# Patient Record
Sex: Male | Born: 1965
Health system: Southern US, Community
[De-identification: ages and names within clinical notes are randomized; demographics above are authoritative.]

## PROBLEM LIST (undated history)

## (undated) ENCOUNTER — Emergency Department (HOSPITAL_COMMUNITY): Payer: Medicaid Other

## (undated) DIAGNOSIS — M48061 Spinal stenosis, lumbar region without neurogenic claudication: Secondary | ICD-10-CM

## (undated) DIAGNOSIS — W3400XA Accidental discharge from unspecified firearms or gun, initial encounter: Secondary | ICD-10-CM

## (undated) DIAGNOSIS — Z9289 Personal history of other medical treatment: Secondary | ICD-10-CM

## (undated) DIAGNOSIS — G5630 Lesion of radial nerve, unspecified upper limb: Secondary | ICD-10-CM

## (undated) DIAGNOSIS — G629 Polyneuropathy, unspecified: Secondary | ICD-10-CM

## (undated) DIAGNOSIS — F1010 Alcohol abuse, uncomplicated: Secondary | ICD-10-CM

## (undated) DIAGNOSIS — K859 Acute pancreatitis without necrosis or infection, unspecified: Secondary | ICD-10-CM

## (undated) DIAGNOSIS — K219 Gastro-esophageal reflux disease without esophagitis: Secondary | ICD-10-CM

## (undated) DIAGNOSIS — M1990 Unspecified osteoarthritis, unspecified site: Secondary | ICD-10-CM

## (undated) DIAGNOSIS — K819 Cholecystitis, unspecified: Secondary | ICD-10-CM

## (undated) DIAGNOSIS — B182 Chronic viral hepatitis C: Secondary | ICD-10-CM

## (undated) DIAGNOSIS — C3490 Malignant neoplasm of unspecified part of unspecified bronchus or lung: Secondary | ICD-10-CM

## (undated) HISTORY — DX: Polyneuropathy, unspecified: G62.9

## (undated) HISTORY — DX: Unspecified osteoarthritis, unspecified site: M19.90

## (undated) HISTORY — PX: COLONOSCOPY: SHX174

## (undated) HISTORY — DX: Personal history of other medical treatment: Z92.89

## (undated) HISTORY — PX: OTHER SURGICAL HISTORY: SHX169

## (undated) HISTORY — DX: Spinal stenosis, lumbar region without neurogenic claudication: M48.061

## (undated) HISTORY — DX: Malignant neoplasm of unspecified part of unspecified bronchus or lung: C34.90

## (undated) HISTORY — DX: Accidental discharge from unspecified firearms or gun, initial encounter: W34.00XA

## (undated) HISTORY — DX: Gastro-esophageal reflux disease without esophagitis: K21.9

## (undated) HISTORY — DX: Chronic viral hepatitis C: B18.2

## (undated) HISTORY — PX: UPPER GASTROINTESTINAL ENDOSCOPY: SHX188

---

## 1985-12-01 HISTORY — PX: COLOSTOMY: SHX63

## 1986-12-01 HISTORY — PX: COLOSTOMY TAKEDOWN: SHX5783

## 1996-12-01 HISTORY — PX: COLOSTOMY TAKEDOWN: SHX5783

## 1998-07-03 ENCOUNTER — Encounter: Admission: RE | Admit: 1998-07-03 | Discharge: 1998-10-01 | Payer: Self-pay | Admitting: Anesthesiology

## 1998-07-23 ENCOUNTER — Emergency Department (HOSPITAL_COMMUNITY): Admission: EM | Admit: 1998-07-23 | Discharge: 1998-07-23 | Payer: Self-pay | Admitting: Internal Medicine

## 2002-11-08 ENCOUNTER — Emergency Department (HOSPITAL_COMMUNITY): Admission: EM | Admit: 2002-11-08 | Discharge: 2002-11-08 | Payer: Self-pay | Admitting: Emergency Medicine

## 2002-11-08 ENCOUNTER — Encounter: Payer: Self-pay | Admitting: Emergency Medicine

## 2002-11-17 ENCOUNTER — Encounter: Payer: Self-pay | Admitting: General Surgery

## 2002-11-17 ENCOUNTER — Encounter: Admission: RE | Admit: 2002-11-17 | Discharge: 2002-11-17 | Payer: Self-pay | Admitting: General Surgery

## 2004-05-21 ENCOUNTER — Emergency Department (HOSPITAL_COMMUNITY): Admission: EM | Admit: 2004-05-21 | Discharge: 2004-05-21 | Payer: Self-pay | Admitting: Family Medicine

## 2004-05-23 ENCOUNTER — Emergency Department (HOSPITAL_COMMUNITY): Admission: EM | Admit: 2004-05-23 | Discharge: 2004-05-23 | Payer: Self-pay | Admitting: Family Medicine

## 2004-06-10 ENCOUNTER — Emergency Department (HOSPITAL_COMMUNITY): Admission: EM | Admit: 2004-06-10 | Discharge: 2004-06-10 | Payer: Self-pay | Admitting: Emergency Medicine

## 2004-07-24 ENCOUNTER — Emergency Department (HOSPITAL_COMMUNITY): Admission: EM | Admit: 2004-07-24 | Discharge: 2004-07-24 | Payer: Self-pay | Admitting: Emergency Medicine

## 2004-11-19 ENCOUNTER — Ambulatory Visit: Payer: Self-pay | Admitting: Internal Medicine

## 2004-11-21 ENCOUNTER — Emergency Department (HOSPITAL_COMMUNITY): Admission: EM | Admit: 2004-11-21 | Discharge: 2004-11-21 | Payer: Self-pay | Admitting: Emergency Medicine

## 2004-12-11 ENCOUNTER — Ambulatory Visit: Payer: Self-pay | Admitting: Internal Medicine

## 2004-12-11 ENCOUNTER — Ambulatory Visit (HOSPITAL_COMMUNITY): Admission: RE | Admit: 2004-12-11 | Discharge: 2004-12-11 | Payer: Self-pay | Admitting: Internal Medicine

## 2005-07-12 ENCOUNTER — Emergency Department (HOSPITAL_COMMUNITY): Admission: EM | Admit: 2005-07-12 | Discharge: 2005-07-12 | Payer: Self-pay | Admitting: Emergency Medicine

## 2006-07-27 ENCOUNTER — Emergency Department (HOSPITAL_COMMUNITY): Admission: EM | Admit: 2006-07-27 | Discharge: 2006-07-27 | Payer: Self-pay | Admitting: Emergency Medicine

## 2007-10-21 ENCOUNTER — Emergency Department (HOSPITAL_COMMUNITY): Admission: EM | Admit: 2007-10-21 | Discharge: 2007-10-21 | Payer: Self-pay | Admitting: Emergency Medicine

## 2009-07-18 ENCOUNTER — Emergency Department (HOSPITAL_COMMUNITY): Admission: EM | Admit: 2009-07-18 | Discharge: 2009-07-18 | Payer: Self-pay | Admitting: Emergency Medicine

## 2010-04-22 ENCOUNTER — Emergency Department (HOSPITAL_COMMUNITY): Admission: EM | Admit: 2010-04-22 | Discharge: 2010-04-22 | Payer: Self-pay | Admitting: Emergency Medicine

## 2010-07-23 ENCOUNTER — Inpatient Hospital Stay (HOSPITAL_COMMUNITY): Admission: EM | Admit: 2010-07-23 | Discharge: 2010-07-24 | Payer: Self-pay | Admitting: Emergency Medicine

## 2010-07-23 ENCOUNTER — Ambulatory Visit: Payer: Self-pay | Admitting: Internal Medicine

## 2010-08-07 ENCOUNTER — Ambulatory Visit: Payer: Self-pay | Admitting: Internal Medicine

## 2010-08-07 ENCOUNTER — Encounter: Payer: Self-pay | Admitting: Internal Medicine

## 2010-08-07 DIAGNOSIS — J939 Pneumothorax, unspecified: Secondary | ICD-10-CM | POA: Insufficient documentation

## 2010-08-07 DIAGNOSIS — J439 Emphysema, unspecified: Secondary | ICD-10-CM

## 2010-08-07 DIAGNOSIS — R002 Palpitations: Secondary | ICD-10-CM | POA: Insufficient documentation

## 2010-08-07 DIAGNOSIS — J449 Chronic obstructive pulmonary disease, unspecified: Secondary | ICD-10-CM | POA: Insufficient documentation

## 2010-08-07 DIAGNOSIS — F17200 Nicotine dependence, unspecified, uncomplicated: Secondary | ICD-10-CM | POA: Insufficient documentation

## 2010-08-07 DIAGNOSIS — J930 Spontaneous tension pneumothorax: Secondary | ICD-10-CM

## 2010-08-07 HISTORY — DX: Spontaneous tension pneumothorax: J93.0

## 2010-08-07 HISTORY — DX: Emphysema, unspecified: J43.9

## 2010-11-05 ENCOUNTER — Telehealth (INDEPENDENT_AMBULATORY_CARE_PROVIDER_SITE_OTHER): Payer: Self-pay | Admitting: *Deleted

## 2010-11-13 ENCOUNTER — Encounter: Payer: Self-pay | Admitting: Internal Medicine

## 2011-01-02 NOTE — Letter (Signed)
Summary: [facility]  [address], [place] [address]   Phone: [phone]  Fax: [phone]    11/13/2010  [facility] [address] [place], [place]  [address]  Dear Mr. WOON,   [place] records indicate that you are now due for a followup here with Dr. Christinia Gully.  Please call the office at [phone] to schedule an appointment. Thank You.        Sincerely,   [employer] [facility]

## 2011-01-02 NOTE — Progress Notes (Signed)
Summary: needs f/u with cxr- ATC NA x 2-letter mailed  ---- Converted from flag ---- ---- 08/07/2010 8:42 PM, Tanda Rockers MD wrote: f/u cxr due by now ------------------------------  ATC pt NA and no option to leave a msg, WCB Tilden Dome  November 06, 2010 10:06 AM  ATC pt, still NA and no option to leave a msg. WCB. Tilden Dome  November 07, 2010 9:30 AM  ATC pt still NA and no option to leave msg. will try back later [facility]  November 12, 2010 4:08 PM   Still unable to reach pt so mailed letter Tilden Dome  November 13, 2010 2:18 PM

## 2011-01-02 NOTE — Assessment & Plan Note (Signed)
Summary: Pulmonary/ first outpt eval for bullitis- needs Alpha One AT    Visit Type:  Hospital Follow-up Primary Provider/Referring Provider:  none  CC:  Post hospital followup.  Pt states that since this am has had dull CP in the center of his chest- constant.  He c/o SOB when he wakes in the am and sometimes gets out of breath with continuous exertion.  Marland Kitchen  History of Present Illness: 26 yobm smoker admitted with right apical bulitis presenting with cp but no fever or purulent sputume 07/23/10  August 07, 2010 Post hospital followup.  Pt states that since this am has had dull CP in the center of his chest- constant.  He c/o SOB when he wakes in the am and sometimes gets out of breath with continuous exertion.     4 cups of coffee daily with palpitations. Pt denies any significant sore throat, dysphagia, itching, sneezing,  nasal congestion or excess secretions,  fever, chills, sweats, unintended wt loss, pleuritic or exertional cp, hempoptysis, change in activity tolerance  orthopnea pnd or leg swelling.   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  Oropharynx no thrush or excess pnd or cobblestoning.  No JVD or cervical adenopathy. Mild accessory muscle hypertrophy. Trachea midline, nl thryroid. Chest was hyperinflated by percussion with diminished breath sounds and moderate increased exp time without wheeze. Hoover sign positive at mid inspiration. Regular rate and rhythm without murmur gallop or rub or increase P2 or edema.  Abd: no hsm, nl excursion. Ext warm without cyanosis or clubbing.     CXR  Procedure date:  08/07/2010  Findings:      Emphysematous changes again seen with biapical blebs.  Air fluid level is again seen in a bleb at the medial right lung apex  Impression & Recommendations:  Problem # 1:  COPD (ICD-496)   DDX of  difficult airways managment all start with A and  include Adherence, Ace Inhibitors, Acid Reflux, Active Sinus Disease, Alpha 1 Antitripsin deficiency, Anxiety masquerading as Airways dz,  ABPA,  allergy(esp in young), Aspiration (esp in elderly), Adverse effects of DPI,  Active smokers, plus one B  = Beta blocker use..   Needs alpha 1 AT  screening -  - Alpha One AT not done per e chart review - in tickle file  Active smoking (see #2)  Problem # 2:  SMOKER (ICD-305.1)  Discussed but not ready to committ to quit at this point - emphasized risks involved in continuing smoking and that patient should consider these in the context of the cost of smoking relative to the benefit obtained.    I took this opportunity to educate the patient regarding the consequences of smoking in airway disorders based on all the data we have from the multiple national lung health studies indicating that smoking cessation, not choice of inhalers or physicians, is the most important aspect of care.       Problem # 3:  PALPITATIONS (ICD-785.1) Resting ekg ok, try minimize caffeine  Problem # 4:  EMPHYSEMATOUS BLEB  (ICD-492.0)  Probaly chronically infected, follow conservatively, placed in tickle file  Orders: Est. Patient Level IV (58527)  Other Orders: EKG w/ Interpretation (93000) T-2 View CXR (78242PN)  Patient Instructions: 1)  stop smoking before it stops you 2)  Please schedule a follow-up appointment in 6 weeks, sooner if needed with pfts 3)  Add needs alpha one screen sent  Appended Document: Pulmonary/ first outpt eval for bullitis- needs Alpha One AT  see if he'll come by for Alpha one screen we send to [place]: Pulmonary/ first outpt eval for bullitis- needs Alpha One AT  Per MW- okay to do this when he returns in Oct 2011.

## 2011-02-14 LAB — LIPID PANEL
Cholesterol: 142 mg/dL (ref 0–200)
HDL: 55 mg/dL (ref >39)
LDL Cholesterol: 75 mg/dL (ref 0–99)
Total CHOL/HDL Ratio: 2.6 RATIO
Triglycerides: 62 mg/dL (ref <150)
VLDL: 12 mg/dL (ref 0–40)

## 2011-02-14 LAB — DIFFERENTIAL
Basophils Absolute: 0 10*3/uL (ref 0.0–0.1)
Basophils Relative: 0 % (ref 0–1)
Eosinophils Absolute: 0.2 10*3/uL (ref 0.0–0.7)
Eosinophils Relative: 3 % (ref 0–5)
Lymphocytes Relative: 21 % (ref 12–46)
Lymphs Abs: 1.7 10*3/uL (ref 0.7–4.0)
Monocytes Absolute: 1 10*3/uL (ref 0.1–1.0)
Monocytes Relative: 13 % — ABNORMAL HIGH (ref 3–12)
Neutro Abs: 5.1 10*3/uL (ref 1.7–7.7)
Neutrophils Relative %: 63 % (ref 43–77)

## 2011-02-14 LAB — CBC
HCT: 44.2 % (ref 39.0–52.0)
HCT: 45.1 % (ref 39.0–52.0)
Hemoglobin: 15.4 g/dL (ref 13.0–17.0)
Hemoglobin: 15.9 g/dL (ref 13.0–17.0)
MCH: 35.1 pg — ABNORMAL HIGH (ref 26.0–34.0)
MCH: 35.6 pg — ABNORMAL HIGH (ref 26.0–34.0)
MCHC: 34.8 g/dL (ref 30.0–36.0)
MCHC: 35.4 g/dL (ref 30.0–36.0)
MCV: 100.7 fL — ABNORMAL HIGH (ref 78.0–100.0)
MCV: 101.1 fL — ABNORMAL HIGH (ref 78.0–100.0)
Platelets: 267 10*3/uL (ref 150–400)
Platelets: 271 10*3/uL (ref 150–400)
RBC: 4.37 MIL/uL (ref 4.22–5.81)
RBC: 4.48 MIL/uL (ref 4.22–5.81)
RDW: 13.7 % (ref 11.5–15.5)
RDW: 13.9 % (ref 11.5–15.5)
WBC: 8.1 10*3/uL (ref 4.0–10.5)
WBC: 9 10*3/uL (ref 4.0–10.5)

## 2011-02-14 LAB — CULTURE, BLOOD (ROUTINE X 2)
Culture: NO GROWTH
Culture: NO GROWTH

## 2011-02-14 LAB — CK TOTAL AND CKMB (NOT AT ARMC)
CK, MB: 1.6 ng/mL (ref 0.3–4.0)
Relative Index: 0.5 (ref 0.0–2.5)
Total CK: 315 U/L — ABNORMAL HIGH (ref 7–232)

## 2011-02-14 LAB — HEPATIC FUNCTION PANEL
ALT: 14 U/L (ref 0–53)
AST: 26 U/L (ref 0–37)
Albumin: 2.6 g/dL — ABNORMAL LOW (ref 3.5–5.2)
Alkaline Phosphatase: 73 U/L (ref 39–117)
Bilirubin, Direct: 0.3 mg/dL (ref 0.0–0.3)
Indirect Bilirubin: 1.1 mg/dL — ABNORMAL HIGH (ref 0.3–0.9)
Total Bilirubin: 1.4 mg/dL — ABNORMAL HIGH (ref 0.3–1.2)
Total Protein: 5.6 g/dL — ABNORMAL LOW (ref 6.0–8.3)

## 2011-02-14 LAB — URINE DRUGS OF ABUSE SCREEN W ALC, ROUTINE (REF LAB)
Amphetamine Screen, Ur: NEGATIVE
Barbiturate Quant, Ur: NEGATIVE
Benzodiazepines.: NEGATIVE
Cocaine Metabolites: NEGATIVE
Creatinine,U: 57.2 mg/dL
Ethyl Alcohol: <10 mg/dL (ref <10)
Marijuana Metabolite: NEGATIVE
Methadone: NEGATIVE
Opiate Screen, Urine: NEGATIVE
Phencyclidine (PCP): NEGATIVE
Propoxyphene: NEGATIVE

## 2011-02-14 LAB — CARDIAC PANEL(CRET KIN+CKTOT+MB+TROPI)
CK, MB: 0.6 ng/mL (ref 0.3–4.0)
CK, MB: 0.8 ng/mL (ref 0.3–4.0)
Relative Index: 0.8 (ref 0.0–2.5)
Relative Index: INVALID (ref 0.0–2.5)
Total CK: 105 U/L (ref 7–232)
Total CK: 97 U/L (ref 7–232)
Troponin I: 0.01 ng/mL (ref 0.00–0.06)
Troponin I: 0.02 ng/mL (ref 0.00–0.06)

## 2011-02-14 LAB — D-DIMER, QUANTITATIVE: D-Dimer, Quant: <0.22 ug/mL-FEU (ref 0.00–0.48)

## 2011-02-14 LAB — POCT CARDIAC MARKERS
CKMB, poc: <1 ng/mL — ABNORMAL LOW (ref 1.0–8.0)
Myoglobin, poc: 37.9 ng/mL (ref 12–200)
Troponin i, poc: <0.05 ng/mL (ref 0.00–0.09)

## 2011-02-14 LAB — HIV ANTIBODY (ROUTINE TESTING W REFLEX): HIV: NONREACTIVE

## 2011-02-14 LAB — COMPREHENSIVE METABOLIC PANEL
ALT: 17 U/L (ref 0–53)
AST: 28 U/L (ref 0–37)
Albumin: 3.2 g/dL — ABNORMAL LOW (ref 3.5–5.2)
Alkaline Phosphatase: 88 U/L (ref 39–117)
BUN: 3 mg/dL — ABNORMAL LOW (ref 6–23)
CO2: 24 mEq/L (ref 19–32)
Calcium: 8.6 mg/dL (ref 8.4–10.5)
Chloride: 106 mEq/L (ref 96–112)
Creatinine, Ser: 0.82 mg/dL (ref 0.4–1.5)
GFR calc Af Amer: >60 mL/min (ref >60)
GFR calc non Af Amer: >60 mL/min (ref >60)
Glucose, Bld: 88 mg/dL (ref 70–99)
Potassium: 3.7 mEq/L (ref 3.5–5.1)
Sodium: 136 mEq/L (ref 135–145)
Total Bilirubin: 1.8 mg/dL — ABNORMAL HIGH (ref 0.3–1.2)
Total Protein: 7 g/dL (ref 6.0–8.3)

## 2011-02-14 LAB — QUANTIFERON TB GOLD ASSAY (BLOOD)
Interferon Gamma Release Assay: NEGATIVE
Mitogen Minus Nil Value: >10 IU/mL
Quantiferon Nil Value: 0.02 IU/mL
TB Antigen Minus Nil Value: 0.02 IU/mL

## 2011-02-14 LAB — IRON AND TIBC
Iron: 87 ug/dL (ref 42–135)
Saturation Ratios: 45 % (ref 20–55)
TIBC: 192 ug/dL — ABNORMAL LOW (ref 215–435)
UIBC: 105 ug/dL

## 2011-02-14 LAB — BASIC METABOLIC PANEL
BUN: 5 mg/dL — ABNORMAL LOW (ref 6–23)
CO2: 25 mEq/L (ref 19–32)
Calcium: 9 mg/dL (ref 8.4–10.5)
Chloride: 102 mEq/L (ref 96–112)
Creatinine, Ser: 0.95 mg/dL (ref 0.4–1.5)
GFR calc Af Amer: >60 mL/min (ref >60)
GFR calc non Af Amer: >60 mL/min (ref >60)
Glucose, Bld: 81 mg/dL (ref 70–99)
Potassium: 3.7 mEq/L (ref 3.5–5.1)
Sodium: 136 mEq/L (ref 135–145)

## 2011-02-14 LAB — TSH: TSH: 2.361 u[IU]/mL (ref 0.350–4.500)

## 2011-02-14 LAB — RETICULOCYTES
RBC.: 4.1 MIL/uL — ABNORMAL LOW (ref 4.22–5.81)
Retic Count, Absolute: 41 10*3/uL (ref 19.0–186.0)
Retic Ct Pct: 1 % (ref 0.4–3.1)

## 2011-02-14 LAB — VITAMIN B12: Vitamin B-12: 304 pg/mL (ref 211–911)

## 2011-02-14 LAB — TROPONIN I: Troponin I: 0.02 ng/mL (ref 0.00–0.06)

## 2011-02-14 LAB — FERRITIN: Ferritin: 249 ng/mL (ref 22–322)

## 2011-02-14 LAB — FOLATE: Folate: >20 ng/mL

## 2011-02-14 LAB — LACTIC ACID, PLASMA: Lactic Acid, Venous: 1.1 mmol/L (ref 0.5–2.2)

## 2011-03-08 LAB — CBC
HCT: 45.9 % (ref 39.0–52.0)
Hemoglobin: 16 g/dL (ref 13.0–17.0)
MCHC: 34.8 g/dL (ref 30.0–36.0)
MCV: 100 fL (ref 78.0–100.0)
Platelets: 297 10*3/uL (ref 150–400)
RBC: 4.59 MIL/uL (ref 4.22–5.81)
RDW: 12.9 % (ref 11.5–15.5)
WBC: 5.7 10*3/uL (ref 4.0–10.5)

## 2011-03-08 LAB — COMPREHENSIVE METABOLIC PANEL
ALT: 22 U/L (ref 0–53)
AST: 47 U/L — ABNORMAL HIGH (ref 0–37)
Albumin: 3.5 g/dL (ref 3.5–5.2)
Alkaline Phosphatase: 78 U/L (ref 39–117)
BUN: 6 mg/dL (ref 6–23)
CO2: 25 mEq/L (ref 19–32)
Calcium: 9 mg/dL (ref 8.4–10.5)
Chloride: 101 mEq/L (ref 96–112)
Creatinine, Ser: 1.01 mg/dL (ref 0.4–1.5)
GFR calc Af Amer: >60 mL/min (ref >60)
GFR calc non Af Amer: >60 mL/min (ref >60)
Glucose, Bld: 91 mg/dL (ref 70–99)
Potassium: 3.9 mEq/L (ref 3.5–5.1)
Sodium: 134 mEq/L — ABNORMAL LOW (ref 135–145)
Total Bilirubin: 2 mg/dL — ABNORMAL HIGH (ref 0.3–1.2)
Total Protein: 7.3 g/dL (ref 6.0–8.3)

## 2011-03-08 LAB — DIFFERENTIAL
Basophils Absolute: 0.1 10*3/uL (ref 0.0–0.1)
Basophils Relative: 1 % (ref 0–1)
Eosinophils Absolute: 0.2 10*3/uL (ref 0.0–0.7)
Eosinophils Relative: 4 % (ref 0–5)
Lymphocytes Relative: 19 % (ref 12–46)
Lymphs Abs: 1.1 10*3/uL (ref 0.7–4.0)
Monocytes Absolute: 0.9 10*3/uL (ref 0.1–1.0)
Monocytes Relative: 16 % — ABNORMAL HIGH (ref 3–12)
Neutro Abs: 3.4 10*3/uL (ref 1.7–7.7)
Neutrophils Relative %: 60 % (ref 43–77)

## 2011-03-08 LAB — HEMOCCULT GUIAC POC 1CARD (OFFICE): Fecal Occult Bld: NEGATIVE

## 2011-03-08 LAB — LIPASE, BLOOD: Lipase: 40 U/L (ref 11–59)

## 2011-12-26 DIAGNOSIS — Z0000 Encounter for general adult medical examination without abnormal findings: Secondary | ICD-10-CM | POA: Insufficient documentation

## 2012-03-09 ENCOUNTER — Ambulatory Visit: Payer: BC Managed Care – PPO

## 2012-03-09 ENCOUNTER — Ambulatory Visit (INDEPENDENT_AMBULATORY_CARE_PROVIDER_SITE_OTHER): Payer: BC Managed Care – PPO | Admitting: Family Medicine

## 2012-03-09 VITALS — BP 143/86 | HR 82 | Temp 99.0°F | Resp 20 | Ht 69.0 in | Wt 141.0 lb

## 2012-03-09 DIAGNOSIS — R05 Cough: Secondary | ICD-10-CM

## 2012-03-09 DIAGNOSIS — R059 Cough, unspecified: Secondary | ICD-10-CM

## 2012-03-09 DIAGNOSIS — R509 Fever, unspecified: Secondary | ICD-10-CM

## 2012-03-09 DIAGNOSIS — J189 Pneumonia, unspecified organism: Secondary | ICD-10-CM

## 2012-03-09 DIAGNOSIS — J129 Viral pneumonia, unspecified: Secondary | ICD-10-CM

## 2012-03-09 DIAGNOSIS — J111 Influenza due to unidentified influenza virus with other respiratory manifestations: Secondary | ICD-10-CM

## 2012-03-09 LAB — POCT CBC
Granulocyte percent: 70.6 %G (ref 37–80)
HCT, POC: 47.4 % (ref 43.5–53.7)
Hemoglobin: 15.4 g/dL (ref 14.1–18.1)
Lymph, poc: 1.9 (ref 0.6–3.4)
MCH, POC: 32.1 pg — AB (ref 27–31.2)
MCHC: 32.5 g/dL (ref 31.8–35.4)
MCV: 98.7 fL — AB (ref 80–97)
MID (cbc): 1.1 — AB (ref 0–0.9)
MPV: 7 fL (ref 0–99.8)
POC Granulocyte: 7.2 — AB (ref 2–6.9)
POC LYMPH PERCENT: 18.9 %L (ref 10–50)
POC MID %: 10.5 %M (ref 0–12)
Platelet Count, POC: 379 10*3/uL (ref 142–424)
RBC: 4.8 M/uL (ref 4.69–6.13)
RDW, POC: 12.9 %
WBC: 10.2 10*3/uL (ref 4.6–10.2)

## 2012-03-09 LAB — POCT RAPID STREP A (OFFICE): Rapid Strep A Screen: NEGATIVE

## 2012-03-09 LAB — POCT INFLUENZA A/B
Influenza A, POC: POSITIVE
Influenza B, POC: NEGATIVE

## 2012-03-09 MED ORDER — HYDROCODONE-HOMATROPINE 5-1.5 MG/5ML PO SYRP: ORAL_SOLUTION | ORAL | Status: AC

## 2012-03-09 MED ORDER — LEVOFLOXACIN 500 MG PO TABS: 500.0000 mg | ORAL_TABLET | Freq: Every day | ORAL | Status: AC

## 2012-03-09 NOTE — Progress Notes (Signed)
Patient ID: Wesley [name] MRN: [id number], DOB: 1966-03-12, 46 y.o. Date of Encounter: 03/09/2012, 12:20 PM  Primary Physician: No primary provider on file.  Chief Complaint:  Chief Complaint  Patient presents with  . Fever    sweating  . Nasal Congestion    x1 week  . Cough  . Sore Throat    HPI: 46 y.o. year old male presents with a 7 [place] history of nasal congestion, post nasal drip, sore throat, sinus pressure, and cough. Subjective fever and chills. Nasal congestion thick and green/yellow. Cough is productive of green/yellow sputum and not associated with time of [place]. Ears feel full, leading to sensation of muffled hearing. Has tried OTC cold preps without success. No GI complaints. Appetite normal. No flu vaccine this year.  No sick contacts, recent antibiotics, or recent travels.   No leg trauma, sedentary periods, or h/o cancer.  No past medical history on file.   Home Meds: Prior to Admission medications   Not on File    Allergies: No Known Allergies  History   Social History  . Marital Status: Single    Spouse Name: N/A    Number of Children: N/A  . Years of Education: N/A   Occupational History  . Not on file.   Social History Main Topics  . Smoking status: Current Everyday Smoker -- 1.0 packs/[place]    Types: Cigarettes  . Smokeless tobacco: Not on file  . Alcohol Use: Not on file  . Drug Use: Not on file  . Sexually Active: Not on file   Other Topics Concern  . Not on file   Social History Narrative  . No narrative on file     Review of Systems: Constitutional: negative for night sweats or weight changes Cardiovascular: negative for chest pain or palpitations Respiratory: negative for hemoptysis, wheezing, or shortness of breath Abdominal: negative for abdominal pain, nausea, vomiting or diarrhea Dermatological: negative for rash Neurologic: negative for headache   Physical Exam: Blood pressure 143/86, pulse 82, temperature 99 F (37.2  C), temperature source Oral, resp. rate 20, height 5' 9" (1.753 m), weight 141 lb (63.957 kg)., Body mass index is 20.82 kg/(m^2). General: Well developed, well nourished, in no acute distress. Head: Normocephalic, atraumatic, eyes without discharge, sclera non-icteric, nares are congested. Bilateral auditory canals clear, TM's are without perforation, pearly grey with reflective cone of light bilaterally. Serous effusion bilaterally behind TM's. Maxillary sinus TTP. Oral cavity moist, dentition normal. Posterior pharynx with post nasal drip and mild erythema. No peritonsillar abscess or tonsillar exudate. Neck: Supple. No thyromegaly. Full ROM. No lymphadenopathy. Lungs: Coarse breath sounds bilaterally without wheezes, rales, or rhonchi. Breathing is unlabored.  Heart: RRR with S1 S2. No murmurs, rubs, or gallops appreciated. Msk:  Strength and tone normal for age. Extremities: No clubbing or cyanosis. No edema. Neuro: Alert and oriented X 3. Moves all extremities spontaneously. CNII-XII grossly in tact. Psych:  Responds to questions appropriately with a normal affect.   Labs: Results for orders placed in visit on 03/09/12  POCT CBC      Component Value Range   WBC 10.2  4.6 - 10.2 (K/uL)   Lymph, poc 1.9  0.6 - 3.4    POC LYMPH PERCENT 18.9  10 - 50 (%L)   MID (cbc) 1.1 (*) 0 - 0.9    POC MID % 10.5  0 - 12 (%M)   POC Granulocyte 7.2 (*) 2 - 6.9    Granulocyte percent 70.6  37 - 80 (%G)   RBC 4.80  4.69 - 6.13 (M/uL)   Hemoglobin 15.4  14.1 - 18.1 (g/dL)   HCT, POC 47.4  43.5 - 53.7 (%)   MCV 98.7 (*) 80 - 97 (fL)   MCH, POC 32.1 (*) 27 - 31.2 (pg)   MCHC 32.5  31.8 - 35.4 (g/dL)   RDW, POC 12.9     Platelet Count, POC 379  142 - 424 (K/uL)   MPV 7.0  0 - 99.8 (fL)  POCT RAPID STREP A (OFFICE)      Component Value Range   Rapid Strep A Screen Negative  Negative   POCT INFLUENZA A/B      Component Value Range   Influenza A, POC Positive     Influenza B, POC Negative     [facility]  reading (PRIMARY) by  Dr. Linna Darner. Bilateral infilatrates   ASSESSMENT AND PLAN:  46 y.o. year old male with influenza A and PNA. -Outside of the Tamiflu window -Levaquin 500 mg #10 1 po daily no RF -Hycodan #4oz 1 tsp po q 4-6 hours prn cough no RF SED -Mucinex -Tylenol/Motrin prn -Rest/fluids -RTC precautions -Recheck 2 days, sooner if worse  Signed, Christell Faith, PA-C 03/09/2012 12:20 PM

## 2012-03-11 LAB — CULTURE, GROUP A STREP: Organism ID, Bacteria: NORMAL

## 2012-03-11 NOTE — Progress Notes (Signed)
Quick Note:  Await final results.  Christell Faith, PA-C 03/11/2012 5:58 PM ______

## 2013-11-03 ENCOUNTER — Emergency Department (HOSPITAL_COMMUNITY)
Admission: EM | Admit: 2013-11-03 | Discharge: 2013-11-03 | Disposition: A | Discharge status: Home / Self Care | Payer: BC Managed Care – PPO | Attending: Emergency Medicine | Admitting: Emergency Medicine

## 2013-11-03 ENCOUNTER — Encounter (HOSPITAL_COMMUNITY): Payer: Self-pay | Admitting: Emergency Medicine

## 2013-11-03 DIAGNOSIS — M25559 Pain in unspecified hip: Secondary | ICD-10-CM | POA: Insufficient documentation

## 2013-11-03 DIAGNOSIS — F17200 Nicotine dependence, unspecified, uncomplicated: Secondary | ICD-10-CM | POA: Insufficient documentation

## 2013-11-03 DIAGNOSIS — T148XXA Other injury of unspecified body region, initial encounter: Secondary | ICD-10-CM

## 2013-11-03 DIAGNOSIS — IMO0002 Reserved for concepts with insufficient information to code with codable children: Secondary | ICD-10-CM | POA: Insufficient documentation

## 2013-11-03 DIAGNOSIS — Y939 Activity, unspecified: Secondary | ICD-10-CM | POA: Insufficient documentation

## 2013-11-03 DIAGNOSIS — Z791 Long term (current) use of non-steroidal anti-inflammatories (NSAID): Secondary | ICD-10-CM | POA: Insufficient documentation

## 2013-11-03 DIAGNOSIS — X58XXXA Exposure to other specified factors, initial encounter: Secondary | ICD-10-CM | POA: Insufficient documentation

## 2013-11-03 DIAGNOSIS — M79651 Pain in right thigh: Secondary | ICD-10-CM

## 2013-11-03 DIAGNOSIS — Y929 Unspecified place or not applicable: Secondary | ICD-10-CM | POA: Insufficient documentation

## 2013-11-03 MED ORDER — TRAMADOL HCL 50 MG PO TABS
50.0000 mg | ORAL_TABLET | Freq: Once | ORAL | Status: AC
  Administered 2013-11-03: 50 mg via ORAL
  Filled 2013-11-03: qty 1

## 2013-11-03 MED ORDER — TRAMADOL HCL 50 MG PO TABS: 50.0000 mg | ORAL_TABLET | Freq: Four times a day (QID) | ORAL | Status: DC | PRN

## 2013-11-03 NOTE — ED Notes (Signed)
Pt reports pain from the right knee cap that radiates to the right hip. Pt reports that the pain began one week ago, however does not recall an injury. Pt denies any deformity or swelling. Pt reports being seen at an Urgent Care on Tuesday for same, pt reports pain has not changed with medications prescribed. Pt is A/O x4 and ambulates unassisted to exam room without complication.

## 2013-11-03 NOTE — ED Provider Notes (Signed)
CSN: [id number]     Arrival date & time 11/03/13  1525 History  This chart was scribed for non-physician practitioner working with Blanchard Kelch, MD by Stacy Gardner, ED scribe. This patient was seen in room WTR6/WTR6 and the patient's care was started at 5:13 PM.  First MD Initiated Contact with Patient 11/03/13 1638     Chief Complaint  Patient presents with  . Leg Pain   (Consider location/radiation/quality/duration/timing/severity/associated sxs/prior Treatment) HPI HPI Comments: Wesley [name] is a 47 y.o. male who presents to the Emergency Department complaining of right patella pain that radiates to his right hip with onset of one week PTA. He explains the pain is 8/10 in severity , shooting, and "shocking". Pt denies injury, swelling or deformity. He was seen at Urgent Care two days ago for the same complaint however the pain has not resolved. He has tried Naprosyn 500 mg and Flexeril 10 mg. In the waiting room of the ED he state his left leg felt as though it was "twisting". Pt denies hip pain. He smokes 1 pack of cigarettes everyday and drinks 8.4 oz of alcohol per week. 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  History reviewed. No pertinent past medical history. Past Surgical History  Procedure Laterality Date  . Colostomy  1987   No family history on file. History  Substance Use Topics  . Smoking status: Current Every [place]egle Smoker -- 1.00 packs/[place] for 38 years    Types: Cigarettes  . Smokeless tobacco: Never Used  . Alcohol Use: 8.4 oz/week    14 Cans of beer per week    Review of Systems  Musculoskeletal: Positive for arthralgias and myalgias.       Right leg pain   All other systems reviewed and are negative.    Allergies  Review of patient's allergies indicates no known allergies.  Home Medications   Current Outpatient Rx  Name  Route  Sig  Dispense  Refill  . cyclobenzaprine (FLEXERIL) 10 MG tablet   Oral   Take 10 mg by mouth 2  (two) times daily as needed for muscle spasms.         . naproxen (NAPROSYN) 500 MG tablet   Oral   Take 500 mg by mouth 2 (two) times daily with a meal.         . traMADol (ULTRAM) 50 MG tablet   Oral   Take 1 tablet (50 mg total) by mouth every 6 (six) hours as needed for moderate pain.   10 tablet   0    BP 129/79  Pulse 98  Temp(Src) 98.2 F (36.8 C) (Oral)  Resp 18  SpO2 98% Physical Exam  Nursing note and vitals reviewed. Constitutional: He is oriented to person, place, and time. He appears well-developed and well-nourished.  HENT:  Head: Normocephalic and atraumatic.  Eyes: EOM are normal.  Neck: Normal range of motion.  Cardiovascular: Normal rate.   Pulmonary/Chest: Effort normal.  Musculoskeletal: Normal range of motion. He exhibits tenderness. He exhibits no edema.  Tenderness superior to right patella Tenderness over the anterior right thigh No edema   Full ROM No joint line tenderness  Neurological: He is alert and oriented to person, place, and time.  Skin: Skin is warm and dry. No erythema.  No ecchymosis  No erythema    Psychiatric: He has a normal mood and affect. His behavior is normal.    ED Course  Procedures (including critical care  time)DIAGNOSTIC STUDIES: Oxygen Saturation is 98% on room air, normal by my interpretation.    COORDINATION OF CARE: 5:16 PM Discussed course of care with pt which includes following up with a PCP, ace wrap and stronger pain medication. Pt understands and agrees.   Labs Review Labs Reviewed - No data to display Imaging Review No results found.  EKG Interpretation   None       MDM   1. Right thigh pain   2. Muscle strain    Pain appears to be muscular in nature as pt is tender over anterior thigh and superior to his patella.  FROM of right hip and knee. No evidence of knee joint involvement. Denies injury. Do not believe imaging would be of benefit at this time.  Rx: ace wrap, tramadol, f/u with  [facility] in 1-2 weeks if not improving. Return precautions provided. Pt verbalized understanding and agreement with tx plan.   I personally performed the services described in this documentation, which was scribed in my presence. The recorded information has been reviewed and is accurate.     Noland Fordyce, PA-C 11/04/13 1051

## 2013-11-04 NOTE — ED Provider Notes (Signed)
Medical screening examination/treatment/procedure(s) were performed by non-physician practitioner and as supervising physician I was immediately available for consultation/collaboration.  EKG Interpretation   None         Blanchard Kelch, MD 11/04/13 1401

## 2014-08-11 ENCOUNTER — Ambulatory Visit (INDEPENDENT_AMBULATORY_CARE_PROVIDER_SITE_OTHER): Payer: BC Managed Care – PPO | Admitting: Family Medicine

## 2014-08-11 VITALS — BP 112/72 | HR 87 | Temp 98.7°F | Resp 16 | Ht 71.0 in | Wt 135.0 lb

## 2014-08-11 DIAGNOSIS — L03114 Cellulitis of left upper limb: Secondary | ICD-10-CM

## 2014-08-11 DIAGNOSIS — IMO0002 Reserved for concepts with insufficient information to code with codable children: Secondary | ICD-10-CM

## 2014-08-11 DIAGNOSIS — T6591XS Toxic effect of unspecified substance, accidental (unintentional), sequela: Secondary | ICD-10-CM

## 2014-08-11 DIAGNOSIS — T63481S Toxic effect of venom of other arthropod, accidental (unintentional), sequela: Secondary | ICD-10-CM

## 2014-08-11 MED ORDER — CEPHALEXIN 500 MG PO CAPS: 500.0000 mg | ORAL_CAPSULE | Freq: Three times a day (TID) | ORAL | Status: DC

## 2014-08-11 NOTE — Progress Notes (Signed)
Discussed placing on an antibiotic to be used if the swollen area becomes red or this weekend.

## 2014-08-11 NOTE — Progress Notes (Signed)
Subjective:    Patient ID: Wesley [name], male    DOB: [birth year], 48 y.o.   MRN: [id number]  HPI This is a very pleasant 48 yo male who presents today with left arm swelling from a wasp sting that he experienced 2 days ago. He had swelling yesterday and took benadryl, he had more swelling today. He has had a total of 3 doses of benadryl. He has not applied ice.   Patient with long history COPD, sees pulmonologist in [place]. Has not been for regular care recently. Breathing stable. Smokes 1/2 ppd. Wife also smokes. Patient reports he has been working really long hours for the last couple of months and has only been eating 1 meal a Prieto. Heat also causes decreased appetite for him.    Review of Systems No fever, no chills, no drainage, no retained stinger. Breathing stable.     Objective:   Physical Exam  Vitals reviewed. Constitutional: He is oriented to person, place, and time. Vital signs are normal.  Very thin male in NAD.  HENT:  Head: Normocephalic and atraumatic.  Eyes: Conjunctivae are normal.  Neck: Normal range of motion. Neck supple.  Cardiovascular: Normal rate, regular rhythm and normal heart sounds.   Pulmonary/Chest: Effort normal and breath sounds normal.  Musculoskeletal: Normal range of motion. He exhibits edema (Left hand and forarm moderately swollen. Full ROM, no erythema, no wound, strong pulse.). He exhibits no tenderness.  Neurological: He is alert and oriented to person, place, and time.  Skin: Skin is warm and dry.      Assessment & Plan:  Discussed with Dr. Linna Darner  1. Insect sting, accidental or unintentional, sequela -Provided written and verbal information regarding diagnosis and treatment.  2. Cellulitis of left upper extremity -I think swelling should peak today, currently no s/s cellulitis, provided wait and see prescription. Instructed patient if no improvement in 24 hours or if worsening, can start antibiotic. - cephALEXin (KEFLEX) 500 MG  capsule; Take 1 capsule (500 mg total) by mouth 3 (three) times daily.  Dispense: 30 capsule; Refill: 0  -Encouraged follow up with pulmonologist and smoking cessation. Also, encouraged increased calorie intake and regular meals.   Elby Beck, FNP-BC  [facility]  08/11/2014 4:48 PM

## 2014-08-11 NOTE — Patient Instructions (Signed)
Please take over the counter cetirizine 10 mg by mouth every [place] for 5-7 days Take benadryl 25 mg every evening while arm is swollen Apply ice pack or cold compresses several times a [place] Elevate arm when you can   Bee, Wasp, or [employer] Your caregiver has diagnosed you as having an insect sting. An insect sting appears as a red lump in the skin that sometimes has a tiny hole in the center, or it may have a stinger in the center of the wound. The most common stings are from wasps, hornets and bees. Individuals have different reactions to insect stings.  A normal reaction may cause pain, swelling, and redness around the sting site.  A localized allergic reaction may cause swelling and redness that extends beyond the sting site.  A large local reaction may continue to develop over the next 12 to 36 hours.  On occasion, the reactions can be severe (anaphylactic reaction). An anaphylactic reaction may cause wheezing; difficulty breathing; chest pain; fainting; raised, itchy, red patches on the skin; a sick feeling to your stomach (nausea); vomiting; cramping; or diarrhea. If you have had an anaphylactic reaction to an insect sting in the past, you are more likely to have one again. HOME CARE INSTRUCTIONS   With bee stings, a small sac of poison is left in the wound. Brushing across this with something such as a credit card, or anything similar, will help remove this and decrease the amount of the reaction. This same procedure will not help a wasp sting as they do not leave behind a stinger and poison sac.  Apply a cold compress for 10 to 20 minutes every hour for 1 to 2 days, depending on severity, to reduce swelling and itching.  To lessen pain, a paste made of water and baking soda may be rubbed on the bite or sting and left on for 5 minutes.  To relieve itching and swelling, you may use take medication or apply medicated creams or lotions as directed.  Only take over-the-counter or  prescription medicines for pain, discomfort, or fever as directed by your caregiver.  Wash the sting site daily with soap and water. Apply antibiotic ointment on the sting site as directed.  If you suffered a severe reaction:  If you did not require hospitalization, an adult will need to stay with you for 24 hours in case the symptoms return.  You may need to wear a medical bracelet or necklace stating the allergy.  You and your family need to learn when and how to use an anaphylaxis kit or epinephrine injection.  If you have had a severe reaction before, always carry your anaphylaxis kit with you. SEEK MEDICAL CARE IF:   None of the above helps within 2 to 3 days.  The area becomes red, warm, tender, and swollen beyond the area of the bite or sting.  You have an oral temperature above 102 F (38.9 C). SEEK IMMEDIATE MEDICAL CARE IF:  You have symptoms of an allergic reaction which are:  Wheezing.  Difficulty breathing.  Chest pain.  Lightheadedness or fainting.  Itchy, raised, red patches on the skin.  Nausea, vomiting, cramping or diarrhea. ANY OF THESE SYMPTOMS MAY REPRESENT A SERIOUS PROBLEM THAT IS AN EMERGENCY. Do not wait to see if the symptoms will go away. Get medical help right away. Call your local emergency services (911 in [place] DO NOT drive yourself to the hospital. MAKE SURE YOU:   Understand these instructions.  Will  watch your condition.  Will get help right away if you are not doing well or get worse. Document Released: 11/17/2005 Document Revised: 02/09/2012 Document Reviewed: 05/04/2010 [facility] Patient Information 2015 [place], [place]. This information is not intended to replace advice given to you by your health care provider. Make sure you discuss any questions you have with your health care provider.

## 2014-11-19 ENCOUNTER — Emergency Department (HOSPITAL_COMMUNITY): Payer: BC Managed Care – PPO

## 2014-11-19 ENCOUNTER — Emergency Department (HOSPITAL_COMMUNITY)
Admission: EM | Admit: 2014-11-19 | Discharge: 2014-11-19 | Disposition: A | Discharge status: Home / Self Care | Payer: BC Managed Care – PPO | Attending: Emergency Medicine | Admitting: Emergency Medicine

## 2014-11-19 ENCOUNTER — Encounter (HOSPITAL_COMMUNITY): Payer: Self-pay | Admitting: *Deleted

## 2014-11-19 DIAGNOSIS — Y9389 Activity, other specified: Secondary | ICD-10-CM | POA: Insufficient documentation

## 2014-11-19 DIAGNOSIS — Z720 Tobacco use: Secondary | ICD-10-CM | POA: Insufficient documentation

## 2014-11-19 DIAGNOSIS — S023XXA Fracture of orbital floor, initial encounter for closed fracture: Secondary | ICD-10-CM | POA: Insufficient documentation

## 2014-11-19 DIAGNOSIS — H1131 Conjunctival hemorrhage, right eye: Secondary | ICD-10-CM | POA: Insufficient documentation

## 2014-11-19 DIAGNOSIS — Y9289 Other specified places as the place of occurrence of the external cause: Secondary | ICD-10-CM | POA: Insufficient documentation

## 2014-11-19 DIAGNOSIS — Y998 Other external cause status: Secondary | ICD-10-CM | POA: Insufficient documentation

## 2014-11-19 DIAGNOSIS — S0990XA Unspecified injury of head, initial encounter: Secondary | ICD-10-CM

## 2014-11-19 DIAGNOSIS — S022XXA Fracture of nasal bones, initial encounter for closed fracture: Secondary | ICD-10-CM

## 2014-11-19 MED ORDER — TOBRAMYCIN 0.3 % OP SOLN
2.0000 [drp] | Freq: Four times a day (QID) | OPHTHALMIC | Status: DC
  Administered 2014-11-19: 2 [drp] via OPHTHALMIC
  Filled 2014-11-19: qty 5

## 2014-11-19 MED ORDER — KETOROLAC TROMETHAMINE 0.5 % OP SOLN
1.0000 [drp] | Freq: Four times a day (QID) | OPHTHALMIC | Status: DC | PRN
  Filled 2014-11-19: qty 3

## 2014-11-19 MED ORDER — OXYCODONE-ACETAMINOPHEN 5-325 MG PO TABS
1.0000 | ORAL_TABLET | Freq: Once | ORAL | Status: AC
Start: 2014-11-19 — End: 2014-11-19
  Administered 2014-11-19: 1 via ORAL
  Filled 2014-11-19: qty 1

## 2014-11-19 MED ORDER — OXYCODONE-ACETAMINOPHEN 5-325 MG PO TABS: 1.0000 | ORAL_TABLET | ORAL | Status: DC | PRN

## 2014-11-19 NOTE — Discharge Instructions (Signed)
Use the Tobrex drops, 2 in the right eye 4 times a [place]. Use the ketorolac drops, one in the right eye 4 times a [place] for pain.   Head Injury You have received a head injury. It does not appear serious at this time. Headaches and vomiting are common following head injury. It should be easy to awaken from sleeping. Sometimes it is necessary for you to stay in the emergency department for a while for observation. Sometimes admission to the hospital may be needed. After injuries such as yours, most problems occur within the first 24 hours, but side effects may occur up to 7-10 days after the injury. It is important for you to carefully monitor your condition and contact your health care provider or seek immediate medical care if there is a change in your condition. WHAT ARE THE TYPES OF HEAD INJURIES? Head injuries can be as minor as a bump. Some head injuries can be more severe. More severe head injuries include:  A jarring injury to the brain (concussion).  A bruise of the brain (contusion). This mean there is bleeding in the brain that can cause swelling.  A cracked skull (skull fracture).  Bleeding in the brain that collects, clots, and forms a bump (hematoma). WHAT CAUSES A HEAD INJURY? A serious head injury is most likely to happen to someone who is in a car wreck and is not wearing a seat belt. Other causes of major head injuries include bicycle or motorcycle accidents, sports injuries, and falls. HOW ARE HEAD INJURIES DIAGNOSED? A complete history of the event leading to the injury and your current symptoms will be helpful in diagnosing head injuries. Many times, pictures of the brain, such as CT or MRI are needed to see the extent of the injury. Often, an overnight hospital stay is necessary for observation.  WHEN SHOULD I SEEK IMMEDIATE MEDICAL CARE?  You should get help right away if:  You have confusion or drowsiness.  You feel sick to your stomach (nauseous) or have continued,  forceful vomiting.  You have dizziness or unsteadiness that is getting worse.  You have severe, continued headaches not relieved by medicine. Only take over-the-counter or prescription medicines for pain, fever, or discomfort as directed by your health care provider.  You do not have normal function of the arms or legs or are unable to walk.  You notice changes in the black spots in the center of the colored part of your eye (pupil).  You have a clear or bloody fluid coming from your nose or ears.  You have a loss of vision. During the next 24 hours after the injury, you must stay with someone who can watch you for the warning signs. This person should contact local emergency services (911 in the [place] if you have seizures, you become unconscious, or you are unable to wake up. HOW CAN I PREVENT A HEAD INJURY IN THE FUTURE? The most important factor for preventing major head injuries is avoiding motor vehicle accidents. To minimize the potential for damage to your head, it is crucial to wear seat belts while riding in motor vehicles. Wearing helmets while bike riding and playing collision sports (like football) is also helpful. Also, avoiding dangerous activities around the house will further help reduce your risk of head injury.  WHEN CAN I RETURN TO NORMAL ACTIVITIES AND ATHLETICS? You should be reevaluated by your health care provider before returning to these activities. If you have any of the following symptoms, you should  not return to activities or contact sports until 1 week after the symptoms have stopped:  Persistent headache.  Dizziness or vertigo.  Poor attention and concentration.  Confusion.  Memory problems.  Nausea or vomiting.  Fatigue or tire easily.  Irritability.  Intolerant of bright lights or loud noises.  Anxiety or depression.  Disturbed sleep. MAKE SURE YOU:   Understand these instructions.  Will watch your condition.  Will get help right away if  you are not doing well or get worse. Document Released: 11/17/2005 Document Revised: 11/22/2013 Document Reviewed: 07/25/2013 [facility] Patient Information 2015 [place], [place]. This information is not intended to replace advice given to you by your health care provider. Make sure you discuss any questions you have with your health care provider.  Nasal Fracture A nasal fracture is a break or crack in the bones of the nose. A minor break usually heals in a month. You often will receive black eyes from a nasal fracture. This is not a cause for concern. The black eyes will go away over 1 to 2 weeks.  DIAGNOSIS  Your caregiver may want to examine you if you are concerned about a fracture of the nose. X-rays of the nose may not show a nasal fracture even when one is present. Sometimes your caregiver must wait 1 to 5 days after the injury to re-check the nose for alignment and to take additional X-rays. Sometimes the caregiver must wait until the swelling has gone down. TREATMENT Minor fractures that have caused no deformity often do not require treatment. More serious fractures where bones are displaced may require surgery. This will take place after the swelling is gone. Surgery will stabilize and align the fracture. HOME CARE INSTRUCTIONS   Put ice on the injured area.  Put ice in a plastic bag.  Place a towel between your skin and the bag.  Leave the ice on for 15-20 minutes, 03-04 times a [place].  Take medications as directed by your caregiver.  Only take over-the-counter or prescription medicines for pain, discomfort, or fever as directed by your caregiver.  If your nose starts bleeding, squeeze the soft parts of the nose against the center wall while you are sitting in an upright position for 10 minutes.  Contact sports should be avoided for at least 3 to 4 weeks or as directed by your caregiver. SEEK MEDICAL CARE IF:  Your pain increases or becomes severe.  You continue to have  nosebleeds.  The shape of your nose does not return to normal within 5 days.  You have pus draining from the nose. SEEK IMMEDIATE MEDICAL CARE IF:   You have bleeding from your nose that does not stop after 20 minutes of pinching the nostrils closed and keeping ice on the nose.  You have clear fluid draining from your nose.  You notice a grape-like swelling on the dividing wall between the nostrils (septum). This is a collection of blood (hematoma) that must be drained to help prevent infection.  You have difficulty moving your eyes.  You have recurrent vomiting. Document Released: 11/14/2000 Document Revised: 02/09/2012 Document Reviewed: 03/03/2011 [facility] Patient Information 2015 [place], [place]. This information is not intended to replace advice given to you by your health care provider. Make sure you discuss any questions you have with your health care provider.  Orbital Floor Fracture, Blowout The eye sits in the bony structure of the skull called the orbit. The upper and outside walls of the orbit are very thick and strong.  These walls protect the eye if the head is struck from the top or side of the eye. However, the inside wall near the nose and the orbit floor are very thin and weak. The bony floor of the orbit also acts as the roof of the air-filled space (sinus) below the orbit. If the eye receives a direct blow from the front, all the tissues around the eye are briefly pressed together. This makes the orbital wall pressure very high. Since the weakest walls tend to give way first, the inside wall or the orbit floor may break. If the floor fractures, the tissues around the eye, including the muscle that is used to make the eye look down, may become trapped within the fracture as the floor of the orbit "blows out" into the sinus below.  CAUSES  Orbital floor fractures are caused by direct (blunt) trauma to the region of the eye. SYMPTOMS  Assuming that there has been no injury to  the eye itself, symptoms can include:  Puffiness (swelling) and bruising around the eye area (black eye).  A gurgling sound when pressure is placed on the eye area. This sound comes from air that has escaped from the sinus into the space around the eye (orbital emphysema).  Seeing two of everything - one object being higher than the other (vertical diplopia). This is the result of the muscle that moves the eye down being trapped within the fracture. Since it cannot relax, the eye is being held in a downward position relative to the other eye and cannot look up. Vertical diplopia from an orbital floor fracture is worse when looking up.  Pain around the eye when looking up.  One eye looks sunken compared to the other eye (enophthalmos).  Numbness of the cheek and upper gum on the same side of the face with the floor fracture. This is a result of nerve injury to these areas. This nerve runs in a groove along the bone of the orbital floor on its way to the cheek and upper gums. DIAGNOSIS  The diagnosis of an orbital floor fracture is suspected during an eye exam by an ophthalmologist. It is confirmed by X-rays or CT scan of the eye region. TREATMENT   Orbital floor fractures are not usually treated until all of the swelling around the eye has gone away. This may take 1 or 2 weeks. Once the swelling has gone down, an ophthalmologist will see if if the muscle below the eye is still trapped within the fracture.  If there is no sign of a trapped muscle or vertical diplopia, treatment is not necessary.  If there is double vision only when looking up, a decision may be made to not do anything since most people do not spend a lot of time looking up. This may depend on the person's profession. For instance, a Development worker, community or electrician may spend a large part of their [place] looking up and would therefore need treatment.  If there is persistent vertical double vision even when looking straight ahead, the  ophthalmologist may try to free the muscle in the office. If this is unsuccessful, surgery is often needed. SEEK IMMEDIATE MEDICAL CARE IF:  You have had a blow to the region of your eyes and have:  A drop in vision in either eye.  Swelling and bruising around either eye.  One eye seems to be "sunken" compared to the other.  You see two of everything with both eyes open when looking in any  direction.  The two images get further apart when looking in a certain direction - especially up.  You have numbness of the cheek and upper gums on the side of the injury.  You develop an unexplained oral temperature over 102 F (38.9 C), or as your caregiver suggests. Document Released: 05/13/2001 Document Revised: 02/09/2012 Document Reviewed: 01/19/2014 [facility] Patient Information 2015 [place], [place]. This information is not intended to replace advice given to you by your health care provider. Make sure you discuss any questions you have with your health care provider.
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
## 2015-05-07 ENCOUNTER — Emergency Department (INDEPENDENT_AMBULATORY_CARE_PROVIDER_SITE_OTHER)
Admission: EM | Admit: 2015-05-07 | Discharge: 2015-05-07 | Disposition: A | Discharge status: Home / Self Care | Payer: BLUE CROSS/BLUE SHIELD | Source: Home / Self Care | Attending: Family Medicine | Admitting: Family Medicine

## 2015-05-07 ENCOUNTER — Encounter (HOSPITAL_COMMUNITY): Payer: Self-pay | Admitting: Emergency Medicine

## 2015-05-07 DIAGNOSIS — G5631 Lesion of radial nerve, right upper limb: Secondary | ICD-10-CM | POA: Diagnosis not present

## 2015-05-07 MED ORDER — PREDNISONE 10 MG (48) PO TBPK: ORAL_TABLET | Freq: Every day | ORAL | Status: DC

## 2015-05-07 NOTE — Discharge Instructions (Signed)
Thank you for coming in today.  Take prednisone daily.  Follow up with Dr. Amada Jupiter.  Use the brace as needed.   Radial Tunnel Syndrome with Rehab Radial tunnel syndrome is a condition of the nervous system in which the radial nerve is compressed by surrounding structures in the elbow or forearm. Weakness in the hand and wrist characterizes the condition. The particular branch of the medial nerve that is usually affected (posterior interosseous branch) does not include sensory nerve cells; therefore, this condition does not usually involve severe pain or numbness. SYMPTOMS   Diffuse pain in the forearm and hand during activity.  Decreased grip and forearm strength.  Outer (lateral) elbow tenderness.  Pain that worsens when rotating the wrist (using a screwdriver or opening a door). CAUSES  An increased pressure placed on the radial nerve causes radial tunnel syndrome. The compression usually occurs in the elbow or forearm by muscles and ligament-like tissue known as the interosseous membrane. The condition may also be caused by direct trauma to the elbow or forearm. RISK INCREASES WITH:  Activities that involve repetitive and/or strenuous wrist and forearm movements (tennis or carpentry).  Contact sports (football, soccer, lacrosse or rugby).  Poor strength and flexibility  Failure to warm-up properly before activity.  Diabetes mellitus.  Underactive thyroid gland (hypothyroidism ). PREVENTION   Warm up and stretch properly before activity.  Maintain physical fitness:  Strength, flexibility, and endurance.  Cardiovascular fitness.  Wear properly fitted and padded protective equipment (elbow pads and slash guards). PROGNOSIS  If treated properly, then the symptoms of radial tunnel syndrome typically resolve. Rarely, surgery is necessary to free the compressed nerve.  RELATED COMPLICATIONS   Permanent nerve damage that results in paralysis or weakness of the forearm  and hand.  Prolonged healing time, if improperly treated or re-injured.  Prolonged disability (uncommon). TREATMENT  Treatment initially involves resting from any activities that aggravate the symptoms. Ice and medications may be used to help reduce pain and inflammation. The use of strengthening and stretching exercises may help reduce pain with activity. These exercises may be performed at home or with referral to a therapist. If there are signs of muscle wasting (atrophy) or symptoms persist for greater than 6 months despite conservative (non-surgical) treatment, then surgery may be recommended. MEDICATION   If pain medication is necessary, then nonsteroidal anti-inflammatory medications, such as aspirin and ibuprofen, or other minor pain relievers, such as acetaminophen, are often recommended.  Do not take pain medication for 7 days before surgery.  Prescription pain relievers may be given if deemed necessary by your caregiver. Use only as directed and only as much as you need. HEAT AND COLD  Cold treatment (icing) relieves pain and reduces inflammation. Cold treatment should be applied for 10 to 15 minutes every 2 to 3 hours for inflammation and pain and immediately after any activity that aggravates your symptoms. Use ice packs or massage the area with a piece of ice (ice massage).  Heat treatment may be used prior to performing the stretching and strengthening activities prescribed by your caregiver, physical therapist, or athletic trainer. Use a heat pack or soak the injury in warm water. SEEK MEDICAL CARE IF:  Treatment seems to offer no benefit, or the condition worsens.  Any medications produce adverse side effects.  Any complications from surgery occur:  Pain, numbness, or coldness in the extremity operated upon.  Discoloration of the nail beds (they become blue or gray) of the extremity operated upon.  Signs  of infections (fever, pain, inflammation, redness, or persistent  bleeding). EXERCISES  RANGE OF MOTION (ROM) AND STRETCHING EXERCISES - Radial Tunnel Syndrome (Radial [Posterior Interosseous] Nerve) These exercises may help you when beginning to rehabilitate your injury. Your symptoms may resolve with or without further involvement from your physician, physical therapist or athletic trainer. While completing these exercises, remember:   Restoring tissue flexibility helps normal motion to return to the joints. This allows healthier, less painful movement and activity.  An effective stretch should be held for at least 30 seconds.  A stretch should never be painful. You should only feel a gentle lengthening or release in the stretched tissue. RANGE OF MOTION - Wrist Flexion, Active-Assisted  Extend your right / left elbow with your fingers pointing down.*  Gently pull the back of your hand towards you until you feel a gentle stretch on the top of your forearm.  Hold this position for __________ seconds. Repeat __________ times. Complete this exercise __________ times per [place].  *If directed by your physician, physical therapist or athletic trainer, complete this stretch with your elbow bent rather than extended. STRETCH - Wrist Flexion  Place the back of your right / left hand on a tabletop leaving your elbow slightly bent. Your fingers should point away from your body.  Gently press the back of your hand down onto the table by straightening your elbow. You should feel a stretch on the top of your forearm.  Hold this position for __________ seconds. Repeat __________ times. Complete this stretch __________ times per [place].  STRENGTHENING EXERCISES - Radial Tunnel Syndrome (Radial [Posterior Interosseous] Nerve) These exercises may help you when beginning to rehabilitate your injury. They may resolve your symptoms with or without further involvement from your physician, physical therapist or athletic trainer. While completing these exercises, remember:    Muscles can gain both the endurance and the strength needed for everyday activities through controlled exercises.  Complete these exercises as instructed by your physician, physical therapist or athletic trainer. Progress the resistance and repetitions only as guided. STRENGTH - Wrist Extensors  Sit with your right / left forearm palm-down and fully supported. Your elbow should be resting below the height of your shoulder. Allow your wrist to extend over the edge of the surface.  Loosely holding a __________ weight or a piece of rubber exercise band/tubing, slowly curl your hand up toward your forearm.  Hold this position for __________ seconds. Slowly lower the wrist back to the starting position in a controlled manner. Repeat __________ times. Complete this exercise __________ times per [place].  STRENGTH - Radial Deviators  Stand with a ____________________ weight in your right / left hand, or sit holding on to the rubber exercise band/tubing with your arm supported.  Raise your hand upward in front of you or pull up on the rubber tubing.  Hold this position for __________ seconds and then slowly lower the wrist back to the starting position. Repeat __________ times. Complete this exercise __________ times per [place]. STRENGTH - Grip   Grasp a tennis ball, a dense sponge, or a large, rolled sock in your hand.  Squeeze as hard as you can without increasing any pain.  Hold this position for __________ seconds. Release your grip slowly. Repeat __________ times. Complete this exercise __________ times per [place].  Document Released: 11/17/2005 Document Revised: 02/09/2012 Document Reviewed: 03/01/2009 [facility] Patient Information 2015 [place], [place]. This information is not intended to replace advice given to you by your health care provider. Make  sure you discuss any questions you have with your health care provider.

## 2015-05-07 NOTE — ED Provider Notes (Signed)
Wesley [name] is a 49 y.o. male who presents to Urgent Care today for right arm pain. Patient has a five-Zimmermann history of right arm pain. The pain is located in the proximal forearm. 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 He denies any injury weakness or numbness. He denies any tingling sensations. No fevers or chills.   History reviewed. No pertinent past medical history. Past Surgical History  Procedure Laterality Date  . Colostomy  1987   History  Substance Use Topics  . Smoking status: Current Every [place] Smoker -- 1.00 packs/Nerio for 38 years    Types: Cigarettes  . Smokeless tobacco: Never Used  . Alcohol Use: 8.4 oz/week    14 Cans of beer per week   ROS as above Medications: No current facility-administered medications for this encounter.   Current Outpatient Prescriptions  Medication Sig Dispense Refill  . predniSONE (STERAPRED UNI-PAK 48 TAB) 10 MG (48) TBPK tablet Take by mouth daily. 48 tablet 0   No Known Allergies   Exam:  BP 137/86 mmHg  Pulse 92  Temp(Src) 98.9 F (37.2 C) (Oral)  Resp 18  SpO2 95% Gen: Well NAD HEENT: EOMI,  MMM Lungs: Normal work of breathing. CTABL Heart: RRR no MRG Abd: NABS, Soft. Nondistended, Nontender Exts: Brisk capillary refill, warm and well perfused.  Neck: Nontender to spinal midline normal neck range of motion negative Spurling's test Arm normal-appearing no swelling. Nontender medial and lateral epicondyles. Some pain along the medial proximal forearm with supination and pronation. Some tenderness to palpation along the lateral proximal forearm in the area of the Arcade of Frohse.  Wrist motion is normal and pain-free. Grip strength is normal and pain-free. Pulses capillary refill sensation  No results found for this or any previous visit (from the past 24 hour(s)). No results found.  Assessment and Plan: 49 y.o. male with right arm pain. This seems to  be an overuse type injury as he has increased workload resulting in pain. I suspect he may have radial tunnel syndrome. Discussed options. Plan for wrist brace, prednisone Dosepak, and follow-up with orthopedics. Return as needed.  Discussed warning signs or symptoms. Please see discharge instructions. Patient expresses understanding.     Gregor Hams, MD 05/07/15 [id number]

## 2015-05-07 NOTE — ED Notes (Signed)
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

## 2015-05-24 ENCOUNTER — Ambulatory Visit (INDEPENDENT_AMBULATORY_CARE_PROVIDER_SITE_OTHER): Payer: BLUE CROSS/BLUE SHIELD | Admitting: Diagnostic Neuroimaging

## 2015-05-24 ENCOUNTER — Encounter (INDEPENDENT_AMBULATORY_CARE_PROVIDER_SITE_OTHER): Payer: Self-pay | Admitting: Diagnostic Neuroimaging

## 2015-05-24 DIAGNOSIS — Z0289 Encounter for other administrative examinations: Secondary | ICD-10-CM

## 2015-05-24 DIAGNOSIS — M79601 Pain in right arm: Secondary | ICD-10-CM

## 2015-05-25 NOTE — Procedures (Signed)
[facility]  NCS (NERVE CONDUCTION STUDY) WITH EMG (ELECTROMYOGRAPHY) REPORT   STUDY DATE: 05/25/15 PATIENT NAME: Wesley [name] DOB: 08-01-1966 MRN: [id number]  ORDERING CLINICIAN: Kathryne Hitch, MD  TECHNOLOGIST: Laretta Alstrom  ELECTROMYOGRAPHER: Earlean Polka. Loralee Weitzman, MD  CLINICAL INFORMATION: 49 year old male with new onset right arm pain, suddenly one month ago. Pain radiates from his right triceps, right elbow, into his right posterior forearm. Evaluate for radial tunnel syndrome versus cervical radiculopathy.  FINDINGS: NERVE CONDUCTION STUDY: Right  median and right ulnar motor responses have normal amplitudes, conduction velocities and F-wave latencies.  Bilateral radial motor responses have normal amplitudes and conduction velocities. Bilateral radial motor response distal latencies are slightly prolonged, but no significant right-left difference.  Right median, right ulnar, right radial sensory responses are normal.   NEEDLE ELECTROMYOGRAPHY: Needle examination of right upper extremity deltoid, biceps, triceps, brachioradialis, flexor carpi radialis, extensor digitorum, first dorsal interosseous, right C5-6 and C7-T1 paraspinal muscles is normal.   IMPRESSION:  Normal study. No electrodiagnostic evidence of cervical radiculopathy or right radial neuropathy at this time. Due to recent onset of symptoms (~1 month), electrodiagnostic abnormalities may not be apparent yet. Correlate clinically and consider repeat study in 4-6 weeks.     INTERPRETING PHYSICIAN:  Penni Bombard, MD Certified in Neurology, Neurophysiology and Neuroimaging  [facility] [address], [place], [place] [address] [phone]

## 2015-05-28 ENCOUNTER — Telehealth: Payer: Self-pay | Admitting: *Deleted

## 2015-05-28 NOTE — Telephone Encounter (Signed)
Pt called in and I spoke with him, informed him that Verneita Griffes RN was attempting to reach him to let him know that his EMG was normal. He stated a thanks and an understanding

## 2015-05-28 NOTE — Telephone Encounter (Signed)
-----  Message from Bradly Chris, RN sent at 05/28/2015  3:24 PM EDT ----- Will you call this pt for me and let him know that his EMG was normal  ----- Message -----    From: Penni Bombard, MD    Sent: 05/25/2015   1:55 PM      To: Penni Bombard, MD

## 2015-05-28 NOTE — Telephone Encounter (Signed)
Unable to reach patient by mobile phone. Spoke with Derek Jack, patient's friend, at his home phone number. She stated he is at work, will not return home until early morning. Informed her that pt's EMG was normal per Dr Leta Baptist. Requested she inform patient; she stated she would, expressed understanding, appreciation.

## 2015-05-29 ENCOUNTER — Encounter (HOSPITAL_BASED_OUTPATIENT_CLINIC_OR_DEPARTMENT_OTHER): Payer: Self-pay | Admitting: *Deleted

## 2015-05-30 ENCOUNTER — Other Ambulatory Visit: Payer: Self-pay | Admitting: Physician Assistant

## 2015-05-30 NOTE — H&P (Signed)
05-29-15  HPI: This is a pleasant 49 year old gentleman who presents to our clinic today to discuss EMG studies of his right forearm.  Wesley [name] has a picture of radial tunnel syndrome right forearm.  This has been ongoing for the past 3-4 weeks.  We have tried a steroid Dosepak with really minimal relief of symptoms.  In fact, his pain does appear to be getting worse.  We have obtained EMG studies form 05/25/2015 which are negative for cervical radiculopathy.  He does show some bilateral radial motor response distal latencies that are slightly prolonged but no significant right left difference.    Past Medical, Family and Social History are reviewed in detail on patient questionnaire and signed.  Review of Systems as detailed in HPI; all others reviewed and are negative.    EXAMINATION: Well-developed, well-nourished gentleman in no acute distress.  Alert and oriented x 3.  Examination of  his right forearm reveals marked tenderness over the radial tunnel.  Pain with supination specifically with resistance.  Pain with grip strength.    IMPRESSION: Radial tunnel syndrome right forearm.  DISPOSITION: Although this has been ongoing for a fairly short course of time, we do not feel as though this is going to get any better.  We have tried conservative treatment which has failed.  At this point, we feel as though our only option is a radial tunnel release right forearm.  Risks, benefits, and possible complications of surgery are reviewed.  Rehab and recover time discussed. All questions are answered.  Paperwork complete.  We will see him at the time of operative intervention.     Ninetta Lights, M.D. Dictated by Tawanna Cooler, PA-C

## 2015-05-31 ENCOUNTER — Encounter (HOSPITAL_BASED_OUTPATIENT_CLINIC_OR_DEPARTMENT_OTHER): Payer: Self-pay | Admitting: *Deleted

## 2015-05-31 ENCOUNTER — Encounter (HOSPITAL_BASED_OUTPATIENT_CLINIC_OR_DEPARTMENT_OTHER)
Admission: RE | Disposition: A | Discharge status: Home / Self Care | Payer: Self-pay | Source: Ambulatory Visit | Attending: Orthopedic Surgery

## 2015-05-31 ENCOUNTER — Ambulatory Visit (HOSPITAL_BASED_OUTPATIENT_CLINIC_OR_DEPARTMENT_OTHER)
Admission: RE | Admit: 2015-05-31 | Discharge: 2015-05-31 | Disposition: A | Discharge status: Home / Self Care | Payer: BLUE CROSS/BLUE SHIELD | Source: Ambulatory Visit | Attending: Orthopedic Surgery | Admitting: Orthopedic Surgery

## 2015-05-31 ENCOUNTER — Ambulatory Visit (HOSPITAL_BASED_OUTPATIENT_CLINIC_OR_DEPARTMENT_OTHER): Payer: BLUE CROSS/BLUE SHIELD | Admitting: Anesthesiology

## 2015-05-31 DIAGNOSIS — G5631 Lesion of radial nerve, right upper limb: Secondary | ICD-10-CM | POA: Diagnosis present

## 2015-05-31 DIAGNOSIS — K088 Other specified disorders of teeth and supporting structures: Secondary | ICD-10-CM | POA: Insufficient documentation

## 2015-05-31 DIAGNOSIS — F17210 Nicotine dependence, cigarettes, uncomplicated: Secondary | ICD-10-CM | POA: Diagnosis not present

## 2015-05-31 HISTORY — DX: Lesion of radial nerve, unspecified upper limb: G56.30

## 2015-05-31 HISTORY — PX: ULNAR NERVE TRANSPOSITION: SHX2595

## 2015-05-31 LAB — POCT HEMOGLOBIN-HEMACUE: Hemoglobin: 16.4 g/dL (ref 13.0–17.0)

## 2015-05-31 SURGERY — ULNAR NERVE DECOMPRESSION/TRANSPOSITION
Anesthesia: General | Site: Elbow | Laterality: Right

## 2015-05-31 MED ORDER — OXYCODONE HCL 5 MG PO TABS: 5.0000 mg | ORAL_TABLET | Freq: Once | ORAL | Status: DC

## 2015-05-31 MED ORDER — LIDOCAINE HCL (CARDIAC) 20 MG/ML IV SOLN
INTRAVENOUS | Status: DC | PRN
  Administered 2015-05-31: 80 mg via INTRAVENOUS

## 2015-05-31 MED ORDER — DEXAMETHASONE SODIUM PHOSPHATE 4 MG/ML IJ SOLN
INTRAMUSCULAR | Status: DC | PRN
  Administered 2015-05-31: 10 mg via INTRAVENOUS

## 2015-05-31 MED ORDER — BUPIVACAINE HCL (PF) 0.25 % IJ SOLN
INTRAMUSCULAR | Status: AC
  Filled 2015-05-31: qty 30

## 2015-05-31 MED ORDER — MIDAZOLAM HCL 2 MG/2ML IJ SOLN
1.0000 mg | INTRAMUSCULAR | Status: DC | PRN
Start: 2015-05-31 — End: 2015-05-31
  Administered 2015-05-31: 2 mg via INTRAVENOUS

## 2015-05-31 MED ORDER — CEFAZOLIN SODIUM-DEXTROSE 2-3 GM-% IV SOLR
INTRAVENOUS | Status: AC
  Filled 2015-05-31: qty 50

## 2015-05-31 MED ORDER — PROPOFOL 10 MG/ML IV BOLUS
INTRAVENOUS | Status: DC | PRN
  Administered 2015-05-31: 160 mg via INTRAVENOUS
  Administered 2015-05-31: 40 mg via INTRAVENOUS

## 2015-05-31 MED ORDER — ALBUTEROL SULFATE HFA 108 (90 BASE) MCG/ACT IN AERS
INHALATION_SPRAY | RESPIRATORY_TRACT | Status: DC | PRN
  Administered 2015-05-31 (×3): 2 via RESPIRATORY_TRACT

## 2015-05-31 MED ORDER — FENTANYL CITRATE (PF) 100 MCG/2ML IJ SOLN
50.0000 ug | INTRAMUSCULAR | Status: AC | PRN
  Administered 2015-05-31: 100 ug via INTRAVENOUS
  Administered 2015-05-31 (×2): 25 ug via INTRAVENOUS

## 2015-05-31 MED ORDER — ONDANSETRON HCL 4 MG PO TABS: 4.0000 mg | ORAL_TABLET | Freq: Three times a day (TID) | ORAL | Status: DC | PRN

## 2015-05-31 MED ORDER — FENTANYL CITRATE (PF) 100 MCG/2ML IJ SOLN
INTRAMUSCULAR | Status: AC
  Filled 2015-05-31: qty 6

## 2015-05-31 MED ORDER — LACTATED RINGERS IV SOLN
INTRAVENOUS | Status: DC
  Administered 2015-05-31: 09:00:00 via INTRAVENOUS

## 2015-05-31 MED ORDER — PROPOFOL 500 MG/50ML IV EMUL
INTRAVENOUS | Status: AC
  Filled 2015-05-31: qty 50

## 2015-05-31 MED ORDER — MIDAZOLAM HCL 2 MG/2ML IJ SOLN
INTRAMUSCULAR | Status: AC
  Filled 2015-05-31: qty 2

## 2015-05-31 MED ORDER — OXYCODONE-ACETAMINOPHEN 5-325 MG PO TABS: 1.0000 | ORAL_TABLET | ORAL | Status: DC | PRN

## 2015-05-31 MED ORDER — GLYCOPYRROLATE 0.2 MG/ML IJ SOLN: 0.2000 mg | Freq: Once | INTRAMUSCULAR | Status: DC | PRN

## 2015-05-31 MED ORDER — 0.9 % SODIUM CHLORIDE (POUR BTL) OPTIME
TOPICAL | Status: DC | PRN
  Administered 2015-05-31: 120 mL

## 2015-05-31 MED ORDER — BUPIVACAINE HCL (PF) 0.25 % IJ SOLN
INTRAMUSCULAR | Status: DC | PRN
  Administered 2015-05-31: 10 mL

## 2015-05-31 MED ORDER — PROMETHAZINE HCL 25 MG/ML IJ SOLN
6.2500 mg | INTRAMUSCULAR | Status: DC | PRN
Start: 2015-05-31 — End: 2015-05-31

## 2015-05-31 MED ORDER — LACTATED RINGERS IV SOLN
INTRAVENOUS | Status: DC
  Administered 2015-05-31: 07:00:00 via INTRAVENOUS

## 2015-05-31 MED ORDER — CEFAZOLIN SODIUM-DEXTROSE 2-3 GM-% IV SOLR
2.0000 g | INTRAVENOUS | Status: AC
  Administered 2015-05-31: 2 g via INTRAVENOUS

## 2015-05-31 MED ORDER — SCOPOLAMINE 1 MG/3DAYS TD PT72: 1.0000 | MEDICATED_PATCH | Freq: Once | TRANSDERMAL | Status: DC | PRN

## 2015-05-31 MED ORDER — HYDROMORPHONE HCL 1 MG/ML IJ SOLN
0.2500 mg | INTRAMUSCULAR | Status: DC | PRN
  Administered 2015-05-31 (×4): 0.5 mg via INTRAVENOUS

## 2015-05-31 MED ORDER — HYDROMORPHONE HCL 1 MG/ML IJ SOLN
INTRAMUSCULAR | Status: AC
  Filled 2015-05-31: qty 1

## 2015-05-31 MED ORDER — CHLORHEXIDINE GLUCONATE 4 % EX LIQD: 60.0000 mL | Freq: Once | CUTANEOUS | Status: DC

## 2015-05-31 SURGICAL SUPPLY — 59 items
BANDAGE ELASTIC 4 VELCRO ST LF (GAUZE/BANDAGES/DRESSINGS) ×4 IMPLANT
BENZOIN TINCTURE PRP APPL 2/3 (GAUZE/BANDAGES/DRESSINGS) ×2 IMPLANT
BLADE SURG 15 STRL LF DISP TIS (BLADE) ×2 IMPLANT
BLADE SURG 15 STRL SS (BLADE) ×2
BNDG COHESIVE 4X5 TAN STRL (GAUZE/BANDAGES/DRESSINGS) ×2 IMPLANT
BNDG ESMARK 4X9 LF (GAUZE/BANDAGES/DRESSINGS) ×2 IMPLANT
CANISTER SUCT 1200ML W/VALVE (MISCELLANEOUS) IMPLANT
CORDS BIPOLAR (ELECTRODE) ×2 IMPLANT
COVER BACK TABLE 60X90IN (DRAPES) ×2 IMPLANT
CUFF TOURN SGL LL 18 NRW (TOURNIQUET CUFF) ×2 IMPLANT
CUFF TOURNIQUET SINGLE 18IN (TOURNIQUET CUFF) IMPLANT
DECANTER SPIKE VIAL GLASS SM (MISCELLANEOUS) IMPLANT
DRAPE EXTREMITY T 121X128X90 (DRAPE) ×2 IMPLANT
DRAPE U 20/CS (DRAPES) ×2 IMPLANT
DRAPE U-SHAPE 47X51 STRL (DRAPES) ×2 IMPLANT
DURAPREP 26ML APPLICATOR (WOUND CARE) ×2 IMPLANT
ELECT REM PT RETURN 9FT ADLT (ELECTROSURGICAL) ×2
ELECTRODE REM PT RTRN 9FT ADLT (ELECTROSURGICAL) ×1 IMPLANT
GAUZE SPONGE 4X4 12PLY STRL (GAUZE/BANDAGES/DRESSINGS) ×2 IMPLANT
GAUZE XEROFORM 1X8 LF (GAUZE/BANDAGES/DRESSINGS) ×2 IMPLANT
GLOVE BIO SURGEON STRL SZ7 (GLOVE) ×2 IMPLANT
GLOVE BIO SURGEON STRL SZ8 (GLOVE) ×2 IMPLANT
GLOVE BIOGEL PI IND STRL 7.0 (GLOVE) ×2 IMPLANT
GLOVE BIOGEL PI IND STRL 8 (GLOVE) ×1 IMPLANT
GLOVE BIOGEL PI INDICATOR 7.0 (GLOVE) ×2
GLOVE BIOGEL PI INDICATOR 8 (GLOVE) ×1
GLOVE ECLIPSE 7.0 STRL STRAW (GLOVE) ×2 IMPLANT
GLOVE ORTHO TXT STRL SZ7.5 (GLOVE) IMPLANT
GLOVE SURG ORTHO 8.0 STRL STRW (GLOVE) ×2 IMPLANT
GOWN STRL REUS W/ TWL LRG LVL3 (GOWN DISPOSABLE) ×1 IMPLANT
GOWN STRL REUS W/ TWL XL LVL3 (GOWN DISPOSABLE) ×2 IMPLANT
GOWN STRL REUS W/TWL LRG LVL3 (GOWN DISPOSABLE) ×1
GOWN STRL REUS W/TWL XL LVL3 (GOWN DISPOSABLE) ×4 IMPLANT
LOOP VESSEL MAXI BLUE (MISCELLANEOUS) IMPLANT
NEEDLE HYPO 25X1 1.5 SAFETY (NEEDLE) ×2 IMPLANT
NS IRRIG 1000ML POUR BTL (IV SOLUTION) ×2 IMPLANT
PACK BASIN DAY SURGERY FS (CUSTOM PROCEDURE TRAY) ×2 IMPLANT
PAD CAST 4YDX4 CTTN HI CHSV (CAST SUPPLIES) ×1 IMPLANT
PADDING CAST ABS 4INX4YD NS (CAST SUPPLIES)
PADDING CAST ABS COTTON 4X4 ST (CAST SUPPLIES) IMPLANT
PADDING CAST COTTON 4X4 STRL (CAST SUPPLIES) ×1
PENCIL BUTTON HOLSTER BLD 10FT (ELECTRODE) ×2 IMPLANT
SLEEVE SCD COMPRESS KNEE MED (MISCELLANEOUS) IMPLANT
SLING ARM LRG ADULT FOAM STRAP (SOFTGOODS) IMPLANT
SLING ARM MED ADULT FOAM STRAP (SOFTGOODS) IMPLANT
STOCKINETTE 4X48 STRL (DRAPES) ×2 IMPLANT
STRIP CLOSURE SKIN 1/2X4 (GAUZE/BANDAGES/DRESSINGS) ×2 IMPLANT
SUCTION FRAZIER TIP 10 FR DISP (SUCTIONS) IMPLANT
SUT MNCRL AB 4-0 PS2 18 (SUTURE) IMPLANT
SUT VIC AB 0 CT1 27 (SUTURE)
SUT VIC AB 0 CT1 27XBRD ANBCTR (SUTURE) IMPLANT
SUT VIC AB 2-0 PS2 27 (SUTURE) ×2 IMPLANT
SUT VIC AB 2-0 SH 27 (SUTURE) ×1
SUT VIC AB 2-0 SH 27XBRD (SUTURE) ×1 IMPLANT
SYR BULB 3OZ (MISCELLANEOUS) ×2 IMPLANT
SYR CONTROL 10ML LL (SYRINGE) ×2 IMPLANT
TOWEL OR 17X24 6PK STRL BLUE (TOWEL DISPOSABLE) ×4 IMPLANT
TUBE CONNECTING 20X1/4 (TUBING) IMPLANT
UNDERPAD 30X30 (UNDERPADS AND DIAPERS) IMPLANT

## 2015-05-31 NOTE — H&P (View-Only) (Signed)
05-29-15  HPI: This is a pleasant 49 year old gentleman who presents to our clinic today to discuss EMG studies of his right forearm.  [facility] has a picture of radial tunnel syndrome right forearm.  This has been ongoing for the past 3-4 weeks.  We have tried a steroid Dosepak with really minimal relief of symptoms.  In fact, his pain does appear to be getting worse.  We have obtained EMG studies form 05/25/2015 which are negative for cervical radiculopathy.  He does show some bilateral radial motor response distal latencies that are slightly prolonged but no significant right left difference.    Past Medical, Family and Social History are reviewed in detail on patient questionnaire and signed.  Review of Systems as detailed in HPI; all others reviewed and are negative.    EXAMINATION: Well-developed, well-nourished gentleman in no acute distress.  Alert and oriented x 3.  Examination of  his right forearm reveals marked tenderness over the radial tunnel.  Pain with supination specifically with resistance.  Pain with grip strength.    IMPRESSION: Radial tunnel syndrome right forearm.  DISPOSITION: Although this has been ongoing for a fairly short course of time, we do not feel as though this is going to get any better.  We have tried conservative treatment which has failed.  At this point, we feel as though our only option is a radial tunnel release right forearm.  Risks, benefits, and possible complications of surgery are reviewed.  Rehab and recover time discussed. All questions are answered.  Paperwork complete.  We will see him at the time of operative intervention.     Ninetta Lights, M.D. Dictated by Tawanna Cooler, PA-C

## 2015-05-31 NOTE — Op Note (Signed)
Wesley [name], Wesley [name] NO.:  [id number]  MEDICAL RECORD NO.:  [id number]  LOCATION:                               FACILITY:  [facility]  PHYSICIAN:  Ninetta Lights, M.D. DATE OF BIRTH:  20-Apr-1966  DATE OF PROCEDURE:  05/31/2015 DATE OF DISCHARGE:  05/31/2015                              OPERATIVE REPORT   PREOPERATIVE DIAGNOSIS:  Right radial tunnel syndrome.  POSTOPERATIVE DIAGNOSIS:  Right radial tunnel syndrome with compression from numerous crossing venous structures and thickened arcade of Frohse.  PROCEDURE:  Right radial tunnel release.  SURGEON:  Ninetta Lights, MD.  ASSISTANT:  Elmyra Ricks, [name]  ANESTHESIA:  General.  BLOOD LOSS:  Minimal.  SPECIMENS:  None.  CULTURES:  None.  COMPLICATIONS:  None.  DRESSINGS:  Soft compressive.  TOURNIQUET TIME:  45 minutes.  DESCRIPTION OF PROCEDURE:  The patient was brought to the operating room, placed on the operating table in supine position.  After adequate anesthesia had been obtained, tourniquet applied, prepped and draped in usual sterile fashion.  Exsanguinated with elevation of Esmarch. Tourniquet inflated to 250 mmHg.  Longitudinal incision in volar aspect of proximal forearm adjacent to the extensor muscles.  Skin and subcutaneous tissue divided.  Hemostasis with cautery.  The extensors were elevated.  This exposed the superficial branch of radial nerve which was intact without compression.  This was followed proximally to the common radial nerve and then the motor branch followed distally. Numerous engorged venous structures were ligated with bipolar cautery protecting the nerve.  The area of marked destruction was just distal out at the arcade of Frohse.  This was carefully cut and then split distally 2-3 cm protected the nerve.  At completion, I had a nice complete decompression and inspection of the nerve throughout.  Wound irrigated.  Closed with subcutaneous subcuticular  closure.  Margins were injected with Marcaine.  Sterile compressive dressing applied.  Tourniquet deflated and removed. Anesthesia reversed.  Brought to the recovery room.  Tolerated the surgery well.  No complications.     Ninetta Lights, M.D.     DFM/MEDQ  D:  05/31/2015  T:  05/31/2015  Job:  [id number]

## 2015-05-31 NOTE — Transfer of Care (Signed)
Immediate Anesthesia Transfer of Care Note  Patient: Wesley [name]  Procedure(s) Performed: Procedure(s): RIGHT RADIAL TUNNEL RELEASE  (Right)  Patient Location: PACU  Anesthesia Type:General  Level of Consciousness: awake, sedated and patient cooperative  Airway & Oxygen Therapy: Patient Spontanous Breathing and Patient connected to face mask oxygen  Post-op Assessment: Report given to RN and Post -op Vital signs reviewed and stable  Post vital signs: Reviewed and stable  Last Vitals:  Filed Vitals:   05/31/15 0621  BP: 140/93  Pulse: 72  Temp: 36.9 C  Resp: 16    Complications: No apparent anesthesia complications

## 2015-05-31 NOTE — Anesthesia Preprocedure Evaluation (Addendum)
Anesthesia Evaluation  Patient identified by MRN, date of birth, ID band Patient awake    Reviewed: Allergy & Precautions, NPO status , Patient's Chart, lab work & pertinent test results  Airway Mallampati: II  TM Distance: >3 FB Neck ROM: Full    Dental  (+) Poor Dentition, Dental Advisory Given   Pulmonary COPDCurrent Smoker,    Pulmonary exam normal       Cardiovascular negative cardio ROS Normal cardiovascular exam    Neuro/Psych negative psych ROS   GI/Hepatic negative GI ROS, Neg liver ROS,   Endo/Other  negative endocrine ROS  Renal/GU negative Renal ROS  negative genitourinary   Musculoskeletal negative musculoskeletal ROS (+)   Abdominal   Peds negative pediatric ROS (+)  Hematology negative hematology ROS (+)   Anesthesia Other Findings   Reproductive/Obstetrics negative OB ROS                            Anesthesia Physical Anesthesia Plan  ASA: II  Anesthesia Plan: General   Post-op Pain Management:    Induction: Intravenous  Airway Management Planned: LMA  Additional Equipment:   Intra-op Plan:   Post-operative Plan: Extubation in OR  Informed Consent: I have reviewed the patients History and Physical, chart, labs and discussed the procedure including the risks, benefits and alternatives for the proposed anesthesia with the patient or authorized representative who has indicated his/her understanding and acceptance.   Dental advisory given  Plan Discussed with: CRNA, Anesthesiologist and Surgeon  Anesthesia Plan Comments:         Anesthesia Quick Evaluation

## 2015-05-31 NOTE — Anesthesia Postprocedure Evaluation (Signed)
Anesthesia Post Note  Patient: Wesley [name]  Procedure(s) Performed: Procedure(s) (LRB): RIGHT RADIAL TUNNEL RELEASE  (Right)  Anesthesia type: general  Patient location: PACU  Post pain: Pain level controlled  Post assessment: Patient's Cardiovascular Status Stable  Last Vitals:  Filed Vitals:   05/31/15 0951  BP: 142/100  Pulse:   Temp:   Resp:     Post vital signs: Reviewed and stable  Level of consciousness: sedated  Complications: No apparent anesthesia complications

## 2015-05-31 NOTE — Anesthesia Procedure Notes (Signed)
Procedure Name: LMA Insertion Date/Time: 05/31/2015 7:39 AM Performed by: Lyndee Leo Pre-anesthesia Checklist: Patient identified, Emergency Drugs available, Suction available and Patient being monitored Patient Re-evaluated:Patient Re-evaluated prior to inductionOxygen Delivery Method: Circle System Utilized Preoxygenation: Pre-oxygenation with 100% oxygen Intubation Type: IV induction Ventilation: Mask ventilation without difficulty LMA: LMA inserted LMA Size: 4.0 Number of attempts: 1 Airway Equipment and Method: Bite block Placement Confirmation: positive ETCO2 Tube secured with: Tape Dental Injury: Teeth and Oropharynx as per pre-operative assessment

## 2015-05-31 NOTE — Interval H&P Note (Signed)
History and Physical Interval Note:  05/31/2015 7:33 AM  Wesley [name]  has presented today for surgery, with the diagnosis of LESION OF RADIAL NERVE RIGHT UPPER LIMB  The various methods of treatment have been discussed with the patient and family. After consideration of risks, benefits and other options for treatment, the patient has consented to  Procedure(s): RIGHT RADIAL TUNNEL RELEASE  (Right) as a surgical intervention .  The patient's history has been reviewed, patient examined, no change in status, stable for surgery.  I have reviewed the patient's chart and labs.  Questions were answered to the patient's satisfaction.     Moody Robben F

## 2015-05-31 NOTE — Discharge Instructions (Signed)
Do not remove bandages until seen at follow up appointment.  May shower, but do not let bandage get wet.  May apply ice for up to 20 minutes at a time for pain and swelling.  Follow up appointment in one week.    Post Anesthesia Home Care Instructions  Activity: Get plenty of rest for the remainder of the [place]. A responsible adult should stay with you for 24 hours following the procedure.  For the next 24 hours, DO NOT: -Drive a car -Paediatric nurse -Drink alcoholic beverages -Take any medication unless instructed by your physician -Make any legal decisions or sign important papers.  Meals: Start with liquid foods such as gelatin or soup. Progress to regular foods as tolerated. Avoid greasy, spicy, heavy foods. If nausea and/or vomiting occur, drink only clear liquids until the nausea and/or vomiting subsides. Call your physician if vomiting continues.  Special Instructions/Symptoms: Your throat may feel dry or sore from the anesthesia or the breathing tube placed in your throat during surgery. If this causes discomfort, gargle with warm salt water. The discomfort should disappear within 24 hours.  If you had a scopolamine patch placed behind your ear for the management of post- operative nausea and/or vomiting:  1. The medication in the patch is effective for 72 hours, after which it should be removed.  Wrap patch in a tissue and discard in the trash. Wash hands thoroughly with soap and water. 2. You may remove the patch earlier than 72 hours if you experience unpleasant side effects which may include dry mouth, dizziness or visual disturbances. 3. Avoid touching the patch. Wash your hands with soap and water after contact with the patch.

## 2015-06-01 ENCOUNTER — Encounter (HOSPITAL_BASED_OUTPATIENT_CLINIC_OR_DEPARTMENT_OTHER): Payer: Self-pay | Admitting: Orthopedic Surgery

## 2017-12-06 DIAGNOSIS — G629 Polyneuropathy, unspecified: Secondary | ICD-10-CM | POA: Insufficient documentation

## 2018-01-28 ENCOUNTER — Encounter: Payer: Self-pay | Admitting: Diagnostic Neuroimaging

## 2018-01-29 ENCOUNTER — Institutional Professional Consult (permissible substitution): Payer: BLUE CROSS/BLUE SHIELD | Admitting: Diagnostic Neuroimaging

## 2018-01-29 DIAGNOSIS — K819 Cholecystitis, unspecified: Secondary | ICD-10-CM

## 2018-01-29 HISTORY — DX: Cholecystitis, unspecified: K81.9

## 2018-02-01 ENCOUNTER — Encounter: Payer: Self-pay | Admitting: Diagnostic Neuroimaging

## 2018-02-07 ENCOUNTER — Emergency Department (HOSPITAL_COMMUNITY): Payer: Self-pay

## 2018-02-07 ENCOUNTER — Other Ambulatory Visit: Payer: Self-pay

## 2018-02-07 ENCOUNTER — Inpatient Hospital Stay (HOSPITAL_COMMUNITY)
Admission: EM | Admit: 2018-02-07 | Discharge: 2018-02-16 | DRG: 439 | Disposition: A | Discharge status: Home Health | Payer: Self-pay | Attending: Internal Medicine | Admitting: Internal Medicine

## 2018-02-07 ENCOUNTER — Encounter (HOSPITAL_COMMUNITY): Payer: Self-pay

## 2018-02-07 DIAGNOSIS — J449 Chronic obstructive pulmonary disease, unspecified: Secondary | ICD-10-CM | POA: Diagnosis present

## 2018-02-07 DIAGNOSIS — F10231 Alcohol dependence with withdrawal delirium: Secondary | ICD-10-CM | POA: Diagnosis present

## 2018-02-07 DIAGNOSIS — R109 Unspecified abdominal pain: Secondary | ICD-10-CM

## 2018-02-07 DIAGNOSIS — G894 Chronic pain syndrome: Secondary | ICD-10-CM | POA: Diagnosis present

## 2018-02-07 DIAGNOSIS — K852 Alcohol induced acute pancreatitis without necrosis or infection: Principal | ICD-10-CM | POA: Diagnosis present

## 2018-02-07 DIAGNOSIS — E46 Unspecified protein-calorie malnutrition: Secondary | ICD-10-CM | POA: Diagnosis present

## 2018-02-07 DIAGNOSIS — F17210 Nicotine dependence, cigarettes, uncomplicated: Secondary | ICD-10-CM | POA: Diagnosis present

## 2018-02-07 DIAGNOSIS — N179 Acute kidney failure, unspecified: Secondary | ICD-10-CM | POA: Diagnosis present

## 2018-02-07 DIAGNOSIS — Z87828 Personal history of other (healed) physical injury and trauma: Secondary | ICD-10-CM

## 2018-02-07 DIAGNOSIS — K859 Acute pancreatitis without necrosis or infection, unspecified: Secondary | ICD-10-CM | POA: Diagnosis present

## 2018-02-07 DIAGNOSIS — G629 Polyneuropathy, unspecified: Secondary | ICD-10-CM | POA: Diagnosis present

## 2018-02-07 DIAGNOSIS — Z9049 Acquired absence of other specified parts of digestive tract: Secondary | ICD-10-CM

## 2018-02-07 DIAGNOSIS — M5410 Radiculopathy, site unspecified: Secondary | ICD-10-CM | POA: Diagnosis present

## 2018-02-07 DIAGNOSIS — K8020 Calculus of gallbladder without cholecystitis without obstruction: Secondary | ICD-10-CM | POA: Diagnosis present

## 2018-02-07 DIAGNOSIS — G4700 Insomnia, unspecified: Secondary | ICD-10-CM

## 2018-02-07 DIAGNOSIS — E871 Hypo-osmolality and hyponatremia: Secondary | ICD-10-CM | POA: Diagnosis present

## 2018-02-07 DIAGNOSIS — IMO0002 Reserved for concepts with insufficient information to code with codable children: Secondary | ICD-10-CM | POA: Diagnosis present

## 2018-02-07 DIAGNOSIS — Z79899 Other long term (current) drug therapy: Secondary | ICD-10-CM

## 2018-02-07 DIAGNOSIS — K5900 Constipation, unspecified: Secondary | ICD-10-CM | POA: Diagnosis present

## 2018-02-07 DIAGNOSIS — R002 Palpitations: Secondary | ICD-10-CM

## 2018-02-07 DIAGNOSIS — F10239 Alcohol dependence with withdrawal, unspecified: Secondary | ICD-10-CM | POA: Diagnosis not present

## 2018-02-07 DIAGNOSIS — F10939 Alcohol use, unspecified with withdrawal, unspecified: Secondary | ICD-10-CM | POA: Diagnosis not present

## 2018-02-07 DIAGNOSIS — J9383 Other pneumothorax: Secondary | ICD-10-CM

## 2018-02-07 DIAGNOSIS — E860 Dehydration: Secondary | ICD-10-CM | POA: Diagnosis present

## 2018-02-07 DIAGNOSIS — G9340 Encephalopathy, unspecified: Secondary | ICD-10-CM | POA: Diagnosis present

## 2018-02-07 DIAGNOSIS — Z8379 Family history of other diseases of the digestive system: Secondary | ICD-10-CM

## 2018-02-07 DIAGNOSIS — R000 Tachycardia, unspecified: Secondary | ICD-10-CM | POA: Diagnosis not present

## 2018-02-07 DIAGNOSIS — Z681 Body mass index (BMI) 19 or less, adult: Secondary | ICD-10-CM

## 2018-02-07 DIAGNOSIS — E876 Hypokalemia: Secondary | ICD-10-CM | POA: Diagnosis present

## 2018-02-07 DIAGNOSIS — K851 Biliary acute pancreatitis without necrosis or infection: Secondary | ICD-10-CM | POA: Diagnosis present

## 2018-02-07 DIAGNOSIS — E878 Other disorders of electrolyte and fluid balance, not elsewhere classified: Secondary | ICD-10-CM | POA: Diagnosis present

## 2018-02-07 DIAGNOSIS — F1010 Alcohol abuse, uncomplicated: Secondary | ICD-10-CM | POA: Diagnosis present

## 2018-02-07 HISTORY — DX: Cholecystitis, unspecified: K81.9

## 2018-02-07 HISTORY — DX: Alcohol abuse, uncomplicated: F10.10

## 2018-02-07 HISTORY — DX: Acute pancreatitis without necrosis or infection, unspecified: K85.90

## 2018-02-07 LAB — COMPREHENSIVE METABOLIC PANEL
ALT: 89 U/L — ABNORMAL HIGH (ref 17–63)
AST: 151 U/L — ABNORMAL HIGH (ref 15–41)
Albumin: 3.9 g/dL (ref 3.5–5.0)
Alkaline Phosphatase: 153 U/L — ABNORMAL HIGH (ref 38–126)
Anion gap: 21 — ABNORMAL HIGH (ref 5–15)
BUN: 6 mg/dL (ref 6–20)
CO2: 30 mmol/L (ref 22–32)
Calcium: 9.7 mg/dL (ref 8.9–10.3)
Chloride: 85 mmol/L — ABNORMAL LOW (ref 101–111)
Creatinine, Ser: 1.55 mg/dL — ABNORMAL HIGH (ref 0.61–1.24)
GFR calc Af Amer: 58 mL/min — ABNORMAL LOW (ref >60)
GFR calc non Af Amer: 50 mL/min — ABNORMAL LOW (ref >60)
Glucose, Bld: 188 mg/dL — ABNORMAL HIGH (ref 65–99)
Potassium: 3.2 mmol/L — ABNORMAL LOW (ref 3.5–5.1)
Sodium: 136 mmol/L (ref 135–145)
Total Bilirubin: 3.4 mg/dL — ABNORMAL HIGH (ref 0.3–1.2)
Total Protein: 9.1 g/dL — ABNORMAL HIGH (ref 6.5–8.1)

## 2018-02-07 LAB — CBC
HCT: 51.9 % (ref 39.0–52.0)
Hemoglobin: 18.5 g/dL — ABNORMAL HIGH (ref 13.0–17.0)
MCH: 35.2 pg — ABNORMAL HIGH (ref 26.0–34.0)
MCHC: 35.6 g/dL (ref 30.0–36.0)
MCV: 98.7 fL (ref 78.0–100.0)
Platelets: 227 10*3/uL (ref 150–400)
RBC: 5.26 MIL/uL (ref 4.22–5.81)
RDW: 13.4 % (ref 11.5–15.5)
WBC: 14.5 10*3/uL — ABNORMAL HIGH (ref 4.0–10.5)

## 2018-02-07 LAB — MAGNESIUM: Magnesium: 1.7 mg/dL (ref 1.7–2.4)

## 2018-02-07 LAB — LIPASE, BLOOD: Lipase: 1579 U/L — ABNORMAL HIGH (ref 11–51)

## 2018-02-07 MED ORDER — IOPAMIDOL (ISOVUE-300) INJECTION 61%
INTRAVENOUS | Status: AC
  Administered 2018-02-07: 100 mL
  Filled 2018-02-07: qty 100

## 2018-02-07 MED ORDER — NICOTINE 21 MG/24HR TD PT24
21.0000 mg | MEDICATED_PATCH | Freq: Every day | TRANSDERMAL | Status: DC
  Administered 2018-02-07 – 2018-02-16 (×10): 21 mg via TRANSDERMAL
  Filled 2018-02-07 (×10): qty 1

## 2018-02-07 MED ORDER — LORAZEPAM 1 MG PO TABS
1.0000 mg | ORAL_TABLET | Freq: Four times a day (QID) | ORAL | Status: DC | PRN
  Administered 2018-02-08: 1 mg via ORAL
  Filled 2018-02-07: qty 1

## 2018-02-07 MED ORDER — ONDANSETRON HCL 4 MG/2ML IJ SOLN: 4.0000 mg | Freq: Three times a day (TID) | INTRAMUSCULAR | Status: DC | PRN

## 2018-02-07 MED ORDER — LACTATED RINGERS IV SOLN
INTRAVENOUS | Status: DC
  Administered 2018-02-07: 19:00:00 via INTRAVENOUS

## 2018-02-07 MED ORDER — VITAMIN B-1 100 MG PO TABS
100.0000 mg | ORAL_TABLET | Freq: Every day | ORAL | Status: DC
  Administered 2018-02-08: 100 mg via ORAL
  Filled 2018-02-07: qty 1

## 2018-02-07 MED ORDER — POTASSIUM CHLORIDE 10 MEQ/100ML IV SOLN
10.0000 meq | INTRAVENOUS | Status: AC
  Administered 2018-02-07 (×3): 10 meq via INTRAVENOUS
  Filled 2018-02-07 (×3): qty 100

## 2018-02-07 MED ORDER — MAGNESIUM SULFATE 2 GM/50ML IV SOLN
2.0000 g | Freq: Once | INTRAVENOUS | Status: AC
Start: 2018-02-07 — End: 2018-02-07
  Administered 2018-02-07: 2 g via INTRAVENOUS
  Filled 2018-02-07: qty 50

## 2018-02-07 MED ORDER — SODIUM CHLORIDE 0.9 % IV BOLUS (SEPSIS)
2000.0000 mL | Freq: Once | INTRAVENOUS | Status: AC
  Administered 2018-02-07: 2000 mL via INTRAVENOUS

## 2018-02-07 MED ORDER — HEPARIN SODIUM (PORCINE) 5000 UNIT/ML IJ SOLN
5000.0000 [IU] | Freq: Three times a day (TID) | INTRAMUSCULAR | Status: AC
  Administered 2018-02-07 – 2018-02-08 (×5): 5000 [IU] via SUBCUTANEOUS
  Filled 2018-02-07 (×5): qty 1

## 2018-02-07 MED ORDER — SODIUM CHLORIDE 0.9 % IV BOLUS (SEPSIS)
1000.0000 mL | Freq: Once | INTRAVENOUS | Status: AC
  Administered 2018-02-07: 1000 mL via INTRAVENOUS

## 2018-02-07 MED ORDER — ONDANSETRON HCL 4 MG/2ML IJ SOLN
4.0000 mg | Freq: Once | INTRAMUSCULAR | Status: AC
  Administered 2018-02-07: 4 mg via INTRAVENOUS
  Filled 2018-02-07: qty 2

## 2018-02-07 MED ORDER — FOLIC ACID 5 MG/ML IJ SOLN
1.0000 mg | Freq: Every day | INTRAMUSCULAR | Status: DC
  Administered 2018-02-07 – 2018-02-11 (×5): 1 mg via INTRAVENOUS
  Filled 2018-02-07 (×6): qty 0.2

## 2018-02-07 MED ORDER — HYDROMORPHONE HCL 1 MG/ML IJ SOLN: 0.5000 mg | INTRAMUSCULAR | Status: DC | PRN

## 2018-02-07 MED ORDER — HYDROMORPHONE HCL 1 MG/ML IJ SOLN
1.0000 mg | Freq: Once | INTRAMUSCULAR | Status: AC
  Administered 2018-02-07: 1 mg via INTRAVENOUS
  Filled 2018-02-07: qty 1

## 2018-02-07 MED ORDER — LACTATED RINGERS IV SOLN
INTRAVENOUS | Status: DC
  Administered 2018-02-08 (×2): via INTRAVENOUS

## 2018-02-07 MED ORDER — THIAMINE HCL 100 MG/ML IJ SOLN
100.0000 mg | Freq: Every day | INTRAMUSCULAR | Status: DC
  Administered 2018-02-07 – 2018-02-09 (×2): 100 mg via INTRAVENOUS
  Filled 2018-02-07 (×3): qty 2

## 2018-02-07 MED ORDER — LORAZEPAM 2 MG/ML IJ SOLN
1.0000 mg | Freq: Four times a day (QID) | INTRAMUSCULAR | Status: DC | PRN
  Administered 2018-02-09 (×3): 1 mg via INTRAVENOUS
  Filled 2018-02-07 (×3): qty 1

## 2018-02-07 NOTE — ED Triage Notes (Signed)
Pt arrives to Ed with c/o of Abdominal pain with n/v x 4 days; pt states he has anuria; Pt a&ox 4 on arrival. Pt states pain 10/10-Monique,RN
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
## 2018-02-07 NOTE — ED Notes (Signed)
Need re-draw for urine , not enough.

## 2018-02-07 NOTE — Progress Notes (Signed)
MD notified pts BP 130/100 manual MD advised to reassess in 1 hr will continue to monitor

## 2018-02-07 NOTE — H&P (Signed)
Date: 02/07/2018               Patient Name:  Wesley [name] MRN: [id number]  DOB: 1966/02/21 Age / Sex: 52 y.o., male   PCP: Marya Landry, NP         Medical Service: Internal Medicine Teaching Service         Attending Physician: Dr. Lucious Groves, DO    First Contact: Dr. Frederico [name]  Pager: [phone]  Second Contact: Dr. Jari [name]  Pager: [phone]       After Hours (After 5p/  First Contact Pager: [phone]  weekends / holidays): Second Contact Pager: [phone]   Chief Complaint: Abdominal pain   History of Present Illness:  Wesley [name] is a 52 yo male with a history of COPD, palpitations, spontaneous pneumothorax, and current tobacco use who presents to the ED with complain of abdominal pain. Patient was in his usual state of health until 3-4 days ago when he started to experience abdominal pain and decreased appetite. He describes the pain as constant once it starts. Rates it 10/10.  It is associated with nausea and vomiting that occurs several times a Wesley [name]. Has had difficulty sleeping the past couple of days due to abdominal pain.  States sleeping on his right side helps with the pain is sleeping on his left side exacerbates the nausea and vomiting.  Denies similar episode in the past.  During this time, he has noticed a decrease in urine output. Denies dysuria, discharge or hematuria. He also endorses chest pain, back pain, subjective fevers, diaphoresis, dizziness, and SOB that started this morning. Denies HA. States he uses medication that causes diarrhea, but denies any diarrhea in the past 2-3 days. He drinks 2-3 16 oz cups of alcohol (Margarita) during the week and more in the weekends ~4 16 ox cups ("you never know during the weekend"). His last drink was on 3/3 or 3/4. No history of withdrawals.   ED course: Wesley [name] was afebrile and HDS on arrival to the ED, but shortly thereafter became tachycardic with HR in 100-120s. Lab work remarkable for lipase of 1,579, WBC 14.5, Hgb 18.5, K 3.2, Cr  1.55, AST 151, ALT 89, Alk Phos 153, TBili 3.4. CT abdomen/pelvis showed gallstones, CBD 10m, and fatty infiltration, but no evidence of acute cholecystitis. UKoreaabdomen showed pancreatic and duodenal edema, gallstones without evidence of acute cholecystitis, hypoattenuation in the distal ileum (intraluminal bleeding cannot be excluded), and hepatic steatosis. He received 1L NS bolus, IV Dilaudid 1 mg, and Zofran.   Review of Systems: A complete ROS was negative except as per HPI.   Meds:  Current Meds  Medication Sig  . cyclobenzaprine (FLEXERIL) 5 MG tablet Take 5 mg by mouth 3 (three) times daily as needed.  . folic acid (FOLVITE) 1 MG tablet Take 1 mg by mouth daily.  .Marland Kitchengabapentin (NEURONTIN) 300 MG capsule Take 300 mg by mouth 3 (three) times daily. X 30 days  . Multiple Vitamin (MULTIVITAMIN) tablet Take 1 tablet by mouth daily.  . ondansetron (ZOFRAN) 4 MG tablet Take 1 tablet (4 mg total) by mouth every 8 (eight) hours as needed for nausea or vomiting.  . traMADol (ULTRAM) 50 MG tablet Take by mouth every 6 (six) hours as needed. Take 1-2 q 4 hr prn pain     Allergies: Allergies as of 02/07/2018  . (No Known Allergies)   Past Medical History:  Diagnosis Date  . Neuropathy   . Radial nerve compression  right    Family History:  Family History  Problem Relation Age of Onset  . Hypertension Mother   . Cirrhosis Father 17  . Multiple sclerosis Sister     Social History:  Social History   Tobacco Use  . Smoking status: Current Every [place] Smoker    Packs/Persons: 1.00    Years: 40.00    Pack years: 40.00    Types: Cigarettes  . Smokeless tobacco: Never Used  . Tobacco comment: 40 pack year  Substance Use Topics  . Alcohol use: Yes    Alcohol/week: 8.4 oz    Types: 14 Cans of beer per week    Comment: 2-3 16oz cups per [place], 4x16oz on weekend - tequilla and margherita  . Drug use: No    Physical Exam: Blood pressure (!) 145/101, pulse (!) 115, temperature 97.7 F  (36.5 C), temperature source Oral, resp. rate 17, height 5' 8" (1.727 m), weight 130 lb (59 kg), SpO2 95 %.  Physical Exam  Constitutional: He is oriented to person, place, and time. He appears well-developed and well-nourished. No distress.  HENT:  Head: Normocephalic and atraumatic.  Mucus membranes dry   Eyes: Conjunctivae are normal. Pupils are equal, round, and reactive to light. No scleral icterus.  Neck: Normal range of motion. Neck supple.  Cardiovascular: Regular rhythm and normal heart sounds. Exam reveals no gallop and no friction rub.  No murmur heard. Tachycardic   Pulmonary/Chest: Effort normal and breath sounds normal. No respiratory distress. He has no wheezes. He has no rales.  Abdominal:  Patient is extremely tender to palpation and jumps out of bed during exam. Abdomen is soft and non-distended. Bowel sounds are decreased. Unable to test for [name] sign due to pain. No rebound tenderness.   Musculoskeletal: He exhibits no edema.  Neurological: He is alert and oriented to person, place, and time.  Skin: Skin is warm. He is not diaphoretic.    EKG: none obtained   CXR: none obtained   Assessment & Plan by Problem: Principal Problem:   Acute pancreatitis Active Problems:   AKI (acute kidney injury) ([place])   Alcohol abuse   Hypokalemia  [name] is a 52 yo male with a history of COPD, palpitations, spontaneous pneumothorax, and current tobacco use who presents to the ED with complain of abdominal pain and found to have elevated lipase to 1500 consistent with acute pancreatitis.   # Acute pancreatitis: Patient presents with ~3 Perfetti history of diffuse abdominal pain that radiates to chest and back and is associated with N/V and decreased appetite. Labworok remarkable for lipase 1500s, transaminitis, elevated Alk Phos and mild hyperbilirubinemia. US abdomen with pancreatic and duodenal edema, no evidence of acute cholecystitis. CBD 6 mm. Alcoholic vs gallstone  pancreatitis given elevated alk phos.  - S/p 1L NS bolus in the ED. Ordered 1L NS bolus x2 followed by LR @ 200 cc/hr x 12 hours  - IV Dilaudid 0.5-1 mg q2h PRN  - NPO   # Oliguric AKI: Patient reports markedly reduced urine output since onset of abdominal pain.  Denies any urinary symptoms. Cr 1.55 on admission, normal at baseline based on labwork from 2011. Suspect this is secondary to dehydration inthe setting of acute pancreatitis. - IVF hydration as above - Strict I's and O's  # Alcohol use disorder: Patient reports daily drinking, 2-3 16 oz Margaritas, more in the weekend. Last drink 5-6 days ago. No history of withdrawals.  - CIWA protocol with Ativan ordered   #  COPD: no PFTs to review. Not on inhalers at home - Will continue to monitor respiratory status   F: 2L BS bolus followed by LR @ 200 cc/hr E: monitoring, repleting K  N: NPO--> full liquids  VTE ppx: SQ heparin   Code status: Full code, confirmed on admission    Dispo: Admit patient to Inpatient with expected length of stay greater than 2 midnights.  SignedWelford Roche, MD 02/07/2018, 1:36 PM  Pager: [phone]
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
## 2018-02-07 NOTE — Progress Notes (Signed)
Wesley [name] is a 52 y.o. male patient admitted from ED awake, alert - oriented  X 4 - no acute distress noted.  VSS - Blood pressure (!) 130/100, pulse (!) 121, temperature 98.7 F (37.1 C), temperature source Oral, resp. rate 19, height 5' 8" (1.727 m), weight 56.5 kg (124 lb 9 oz), SpO2 95 %.    IV in place, occlusive dsg intact without redness.  Orientation to room, and floor completed with information packet given to patient/family.  Patient declined safety video at this time.  Admission INP armband ID verified with patient/family, and in place.   SR up x 2, fall assessment complete, with patient and family able to verbalize understanding of risk associated with falls, and verbalized understanding to call nsg before up out of bed.  Call light within reach, patient able to voice, and demonstrate understanding.  Skin, clean-dry- intact without evidence of bruising, or skin tears.   No evidence of skin break down noted on exam.     Will cont to eval and treat per MD orders.  Luci Bank, RN 02/07/2018 3:59 PM

## 2018-02-08 DIAGNOSIS — F1099 Alcohol use, unspecified with unspecified alcohol-induced disorder: Secondary | ICD-10-CM

## 2018-02-08 DIAGNOSIS — K8020 Calculus of gallbladder without cholecystitis without obstruction: Secondary | ICD-10-CM | POA: Diagnosis present

## 2018-02-08 DIAGNOSIS — G894 Chronic pain syndrome: Secondary | ICD-10-CM | POA: Insufficient documentation

## 2018-02-08 DIAGNOSIS — Z720 Tobacco use: Secondary | ICD-10-CM

## 2018-02-08 DIAGNOSIS — E871 Hypo-osmolality and hyponatremia: Secondary | ICD-10-CM | POA: Diagnosis present

## 2018-02-08 LAB — COMPREHENSIVE METABOLIC PANEL
ALT: 50 U/L (ref 17–63)
AST: 80 U/L — ABNORMAL HIGH (ref 15–41)
Albumin: 2.9 g/dL — ABNORMAL LOW (ref 3.5–5.0)
Alkaline Phosphatase: 95 U/L (ref 38–126)
Anion gap: 14 (ref 5–15)
BUN: 5 mg/dL — ABNORMAL LOW (ref 6–20)
CO2: 26 mmol/L (ref 22–32)
Calcium: 8.6 mg/dL — ABNORMAL LOW (ref 8.9–10.3)
Chloride: 90 mmol/L — ABNORMAL LOW (ref 101–111)
Creatinine, Ser: 0.82 mg/dL (ref 0.61–1.24)
GFR calc Af Amer: >60 mL/min (ref >60)
GFR calc non Af Amer: >60 mL/min (ref >60)
Glucose, Bld: 70 mg/dL (ref 65–99)
Potassium: 2.6 mmol/L — CL (ref 3.5–5.1)
Sodium: 130 mmol/L — ABNORMAL LOW (ref 135–145)
Total Bilirubin: 3 mg/dL — ABNORMAL HIGH (ref 0.3–1.2)
Total Protein: 6.7 g/dL (ref 6.5–8.1)

## 2018-02-08 LAB — BASIC METABOLIC PANEL
Anion gap: 11 (ref 5–15)
Anion gap: 11 (ref 5–15)
Anion gap: 10 (ref 5–15)
BUN: 5 mg/dL — ABNORMAL LOW (ref 6–20)
BUN: <5 mg/dL — ABNORMAL LOW (ref 6–20)
BUN: <5 mg/dL — ABNORMAL LOW (ref 6–20)
CO2: 28 mmol/L (ref 22–32)
CO2: 25 mmol/L (ref 22–32)
CO2: 26 mmol/L (ref 22–32)
Calcium: 8.5 mg/dL — ABNORMAL LOW (ref 8.9–10.3)
Calcium: 8.2 mg/dL — ABNORMAL LOW (ref 8.9–10.3)
Calcium: 8.5 mg/dL — ABNORMAL LOW (ref 8.9–10.3)
Chloride: 90 mmol/L — ABNORMAL LOW (ref 101–111)
Chloride: 90 mmol/L — ABNORMAL LOW (ref 101–111)
Chloride: 91 mmol/L — ABNORMAL LOW (ref 101–111)
Creatinine, Ser: 0.78 mg/dL (ref 0.61–1.24)
Creatinine, Ser: 0.66 mg/dL (ref 0.61–1.24)
Creatinine, Ser: 0.73 mg/dL (ref 0.61–1.24)
GFR calc Af Amer: >60 mL/min (ref >60)
GFR calc Af Amer: >60 mL/min (ref >60)
GFR calc Af Amer: >60 mL/min (ref >60)
GFR calc non Af Amer: >60 mL/min (ref >60)
GFR calc non Af Amer: >60 mL/min (ref >60)
GFR calc non Af Amer: >60 mL/min (ref >60)
Glucose, Bld: 77 mg/dL (ref 65–99)
Glucose, Bld: 73 mg/dL (ref 65–99)
Glucose, Bld: 82 mg/dL (ref 65–99)
Potassium: 2.9 mmol/L — ABNORMAL LOW (ref 3.5–5.1)
Potassium: 2.7 mmol/L — CL (ref 3.5–5.1)
Potassium: 2.9 mmol/L — ABNORMAL LOW (ref 3.5–5.1)
Sodium: 129 mmol/L — ABNORMAL LOW (ref 135–145)
Sodium: 126 mmol/L — ABNORMAL LOW (ref 135–145)
Sodium: 127 mmol/L — ABNORMAL LOW (ref 135–145)

## 2018-02-08 LAB — MAGNESIUM: Magnesium: 1.6 mg/dL — ABNORMAL LOW (ref 1.7–2.4)

## 2018-02-08 LAB — CBC
HCT: 40.2 % (ref 39.0–52.0)
Hemoglobin: 14.3 g/dL (ref 13.0–17.0)
MCH: 34.5 pg — ABNORMAL HIGH (ref 26.0–34.0)
MCHC: 35.6 g/dL (ref 30.0–36.0)
MCV: 97.1 fL (ref 78.0–100.0)
Platelets: 195 10*3/uL (ref 150–400)
RBC: 4.14 MIL/uL — ABNORMAL LOW (ref 4.22–5.81)
RDW: 12.9 % (ref 11.5–15.5)
WBC: 13.7 10*3/uL — ABNORMAL HIGH (ref 4.0–10.5)

## 2018-02-08 LAB — LIPID PANEL
Cholesterol: 108 mg/dL (ref 0–200)
HDL: 18 mg/dL — ABNORMAL LOW (ref >40)
LDL Cholesterol: 75 mg/dL (ref 0–99)
Total CHOL/HDL Ratio: 6 RATIO
Triglycerides: 74 mg/dL (ref <150)
VLDL: 15 mg/dL (ref 0–40)

## 2018-02-08 LAB — OSMOLALITY: Osmolality: 258 mOsm/kg — ABNORMAL LOW (ref 275–295)

## 2018-02-08 LAB — HIV ANTIBODY (ROUTINE TESTING W REFLEX): HIV Screen 4th Generation wRfx: NONREACTIVE

## 2018-02-08 MED ORDER — POTASSIUM CHLORIDE 10 MEQ/100ML IV SOLN
10.0000 meq | INTRAVENOUS | Status: AC
  Administered 2018-02-08 (×5): 10 meq via INTRAVENOUS
  Filled 2018-02-08 (×6): qty 100

## 2018-02-08 MED ORDER — POTASSIUM CHLORIDE 10 MEQ/100ML IV SOLN
10.0000 meq | INTRAVENOUS | Status: DC
  Administered 2018-02-08: 10 meq via INTRAVENOUS

## 2018-02-08 MED ORDER — SODIUM CHLORIDE 0.9 % IV SOLN
INTRAVENOUS | Status: DC
  Administered 2018-02-08 – 2018-02-10 (×4): via INTRAVENOUS

## 2018-02-08 MED ORDER — POTASSIUM CHLORIDE 10 MEQ/100ML IV SOLN
10.0000 meq | INTRAVENOUS | Status: AC
  Filled 2018-02-08: qty 100

## 2018-02-08 MED ORDER — POTASSIUM CHLORIDE CRYS ER 20 MEQ PO TBCR
40.0000 meq | EXTENDED_RELEASE_TABLET | Freq: Two times a day (BID) | ORAL | Status: AC
  Administered 2018-02-08 – 2018-02-09 (×2): 40 meq via ORAL
  Filled 2018-02-08 (×2): qty 2

## 2018-02-08 MED ORDER — POLYETHYLENE GLYCOL 3350 17 G PO PACK
17.0000 g | PACK | Freq: Every day | ORAL | Status: DC
  Administered 2018-02-11 – 2018-02-16 (×3): 17 g via ORAL
  Filled 2018-02-08 (×6): qty 1

## 2018-02-08 MED ORDER — MAGNESIUM SULFATE 2 GM/50ML IV SOLN
2.0000 g | Freq: Once | INTRAVENOUS | Status: AC
  Administered 2018-02-08: 2 g via INTRAVENOUS
  Filled 2018-02-08: qty 50
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
## 2018-02-08 NOTE — Progress Notes (Signed)
Subjective:  No acute events overnight.  Patient reports abdominal pain continues to improve and rates it at 3/10.  He continues to tolerate p.o. Intake.  Did not voice any complaints this morning.  Discussed with patient likely need for surgery (lap chole) in the setting of cholelithiasis and acute pancreatitis. Patient verbalized understanding. Also discussed with patient GI consult.   Objective:  Vital signs in last 24 hours: Vitals:   02/07/18 1730 02/08/18 0019 02/08/18 0606 02/08/18 0855  BP: (!) 129/96 (!) 141/93 (!) 147/96 (!) 137/100  Pulse: (!) 118 (!) 105 (!) 105 (!) 121  Resp:  _0 Temp:  98.9 F (37.2 C) 98.2 F (36.8 C) 99.6 F (37.6 C)  TempSrc:  Oral Oral Oral  SpO2:  96% 100% 95%  Weight:      Height:       Physical Exam  Constitutional: He is oriented to person, place, and time.  Patient appears comfortable in bed.  He is in no acute distress.  Cardiovascular: Normal rate, regular rhythm and normal heart sounds. Exam reveals no gallop and no friction rub.  No murmur heard. Pulmonary/Chest: Effort normal and breath sounds normal. No respiratory distress. He has no wheezes. He has no rales.  Abdominal: Soft. Bowel sounds are normal. He exhibits no distension. There is tenderness.  Mild epigastric tenderness on palpation.   Musculoskeletal: Normal range of motion. He exhibits edema.  Neurological: He is alert and oriented to person, place, and time.  Skin: Skin is warm. No rash noted.    Assessment/Plan:  Principal Problem:   Acute pancreatitis Active Problems:   AKI (acute kidney injury) ([facility])   Alcohol abuse   Hypokalemia   Cholelithiasis   Hyponatremia   Chronic pain syndrome  Wesley [name] is a 52 yo male with a history of COPD, palpitations, spontaneous pneumothorax, and current tobacco use who presents to the ED with complain of abdominal pain and found to have elevated lipase to 1500 consistent with acute pancreatitis.   # Acute  pancreatitis: Alcoholic vs. Gallstone pancreatitis. Lipid panel normal. Patient is doing well this morning reports minimal abdominal pain.  Tolerating p.o. intake while on clear liquids.  Has not requested pain medication since admission.  GI consulted due to concern for gallstone pancreatitis who recommended surgical consult for cholecystectomy as well as outpatient screening colonoscopy. Per surgery note, patient may go for surgery tomorrow.  - IVF with NS @ 100 cc/hr  - On CL diet ---> NPO at MN for possible surgery tomorrow  - GI consult, appreciate recommendations - Surgery consult, appreciate recommendations   # Hyponatremia # Hypokalemia Patient hypokalemic with K 3.2 on presentation.  He received 3 rounds of IV potassium in the ED.  K 2.7 this morning and Na 130.  Repeat BMP this AM with K 2.9 and Na 129.  Ordered 6 rounds of IV potassium.  Repeat BMP with K 2.7 and and Na 126. Mag 1.7. Spoke to RN and asked her to draw BMP from arm without IV as I am concerned these lab results could be falsely low if they are being drawn near PIV. Will not order more runs of IV potassium until repeat BMP. If patient continues to be hypokalemic and hyponatremic, will continue to replete K and workup hyponatremia.  - STAT BMP and Mag  - EKG   # Oliguric AKI:  Urine output improving with IVF hydration and AKI resolved.  Etiology of oliguria likely secondary to dehydration in setting  of acute pancreatitis.  We will continue to monitor.   - IVF hydration as above - Strict I's and O's  # Alcohol use disorder: Patient reports daily drinking, 2-3 16 oz Margaritas, more in the weekend. Last drink 5-6 days ago. No history of withdrawals.  - CIWA protocol with Ativan ordered   # COPD: no PFTs to review. Not on inhalers at home - Will continue to monitor respiratory status    Dispo: Anticipated discharge in approximately 1-4 [place](s) pending surgical evaluation for lap chole.   Welford Roche,  MD 02/08/2018, 6:33 PM Pager: [phone]
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
## 2018-02-09 DIAGNOSIS — K859 Acute pancreatitis without necrosis or infection, unspecified: Secondary | ICD-10-CM

## 2018-02-09 DIAGNOSIS — F10231 Alcohol dependence with withdrawal delirium: Secondary | ICD-10-CM

## 2018-02-09 DIAGNOSIS — E878 Other disorders of electrolyte and fluid balance, not elsewhere classified: Secondary | ICD-10-CM | POA: Diagnosis present

## 2018-02-09 LAB — BASIC METABOLIC PANEL
Anion gap: 11 (ref 5–15)
Anion gap: 12 (ref 5–15)
BUN: <5 mg/dL — ABNORMAL LOW (ref 6–20)
BUN: <5 mg/dL — ABNORMAL LOW (ref 6–20)
CO2: 25 mmol/L (ref 22–32)
CO2: 24 mmol/L (ref 22–32)
Calcium: 8.5 mg/dL — ABNORMAL LOW (ref 8.9–10.3)
Calcium: 8.6 mg/dL — ABNORMAL LOW (ref 8.9–10.3)
Chloride: 93 mmol/L — ABNORMAL LOW (ref 101–111)
Chloride: 91 mmol/L — ABNORMAL LOW (ref 101–111)
Creatinine, Ser: 0.68 mg/dL (ref 0.61–1.24)
Creatinine, Ser: 0.77 mg/dL (ref 0.61–1.24)
GFR calc Af Amer: >60 mL/min (ref >60)
GFR calc Af Amer: >60 mL/min (ref >60)
GFR calc non Af Amer: >60 mL/min (ref >60)
GFR calc non Af Amer: >60 mL/min (ref >60)
Glucose, Bld: 74 mg/dL (ref 65–99)
Glucose, Bld: 108 mg/dL — ABNORMAL HIGH (ref 65–99)
Potassium: 3.1 mmol/L — ABNORMAL LOW (ref 3.5–5.1)
Potassium: 4 mmol/L (ref 3.5–5.1)
Sodium: 129 mmol/L — ABNORMAL LOW (ref 135–145)
Sodium: 127 mmol/L — ABNORMAL LOW (ref 135–145)

## 2018-02-09 LAB — CBC
HCT: 38.7 % — ABNORMAL LOW (ref 39.0–52.0)
Hemoglobin: 14.2 g/dL (ref 13.0–17.0)
MCH: 34.8 pg — ABNORMAL HIGH (ref 26.0–34.0)
MCHC: 36.7 g/dL — ABNORMAL HIGH (ref 30.0–36.0)
MCV: 94.9 fL (ref 78.0–100.0)
Platelets: 205 10*3/uL (ref 150–400)
RBC: 4.08 MIL/uL — ABNORMAL LOW (ref 4.22–5.81)
RDW: 12.9 % (ref 11.5–15.5)
WBC: 13.9 10*3/uL — ABNORMAL HIGH (ref 4.0–10.5)

## 2018-02-09 LAB — TSH: TSH: 1.106 u[IU]/mL (ref 0.350–4.500)

## 2018-02-09 LAB — HEPATIC FUNCTION PANEL
ALT: 50 U/L (ref 17–63)
AST: 99 U/L — ABNORMAL HIGH (ref 15–41)
Albumin: 2.9 g/dL — ABNORMAL LOW (ref 3.5–5.0)
Alkaline Phosphatase: 102 U/L (ref 38–126)
Bilirubin, Direct: 1.4 mg/dL — ABNORMAL HIGH (ref 0.1–0.5)
Indirect Bilirubin: 2.4 mg/dL — ABNORMAL HIGH (ref 0.3–0.9)
Total Bilirubin: 3.8 mg/dL — ABNORMAL HIGH (ref 0.3–1.2)
Total Protein: 7 g/dL (ref 6.5–8.1)

## 2018-02-09 LAB — PHOSPHORUS
Phosphorus: <1 mg/dL — CL (ref 2.5–4.6)
Phosphorus: 3.3 mg/dL (ref 2.5–4.6)

## 2018-02-09 LAB — NA AND K (SODIUM & POTASSIUM), RAND UR
Potassium Urine: 11 mmol/L
Sodium, Ur: 93 mmol/L

## 2018-02-09 LAB — CREATININE, URINE, RANDOM: Creatinine, Urine: 26.02 mg/dL

## 2018-02-09 LAB — OSMOLALITY, URINE: Osmolality, Ur: 256 mOsm/kg — ABNORMAL LOW (ref 300–900)

## 2018-02-09 LAB — LIPASE, BLOOD: Lipase: 171 U/L — ABNORMAL HIGH (ref 11–51)

## 2018-02-09 MED ORDER — POTASSIUM PHOSPHATES 15 MMOLE/5ML IV SOLN
40.0000 mmol | Freq: Once | INTRAVENOUS | Status: AC
  Administered 2018-02-09: 40 mmol via INTRAVENOUS
  Filled 2018-02-09: qty 13.33

## 2018-02-09 MED ORDER — LORAZEPAM 2 MG/ML IJ SOLN
1.0000 mg | Freq: Once | INTRAMUSCULAR | Status: AC
  Administered 2018-02-09: 1 mg via INTRAVENOUS
  Filled 2018-02-09: qty 1

## 2018-02-09 MED ORDER — POTASSIUM CHLORIDE CRYS ER 20 MEQ PO TBCR
40.0000 meq | EXTENDED_RELEASE_TABLET | Freq: Once | ORAL | Status: AC
  Administered 2018-02-09: 40 meq via ORAL
  Filled 2018-02-09: qty 2

## 2018-02-09 MED ORDER — VITAMIN B-1 100 MG PO TABS
100.0000 mg | ORAL_TABLET | Freq: Every day | ORAL | Status: DC
  Administered 2018-02-11 – 2018-02-16 (×6): 100 mg via ORAL
  Filled 2018-02-09 (×7): qty 1

## 2018-02-09 MED ORDER — VITAMIN B-1 100 MG PO TABS: 100.0000 mg | ORAL_TABLET | Freq: Every day | ORAL | Status: DC

## 2018-02-09 MED ORDER — POTASSIUM CHLORIDE 10 MEQ/100ML IV SOLN
10.0000 meq | INTRAVENOUS | Status: DC
  Administered 2018-02-09 (×4): 10 meq via INTRAVENOUS
  Filled 2018-02-09 (×4): qty 100

## 2018-02-09 MED ORDER — POTASSIUM CHLORIDE 10 MEQ/100ML IV SOLN
10.0000 meq | INTRAVENOUS | Status: AC
  Administered 2018-02-09 (×3): 10 meq via INTRAVENOUS
  Filled 2018-02-09 (×2): qty 100

## 2018-02-09 MED ORDER — LORAZEPAM 2 MG/ML IJ SOLN
2.0000 mg | INTRAMUSCULAR | Status: DC | PRN
  Administered 2018-02-09 – 2018-02-10 (×4): 2 mg via INTRAVENOUS
  Filled 2018-02-09 (×6): qty 1

## 2018-02-09 MED ORDER — FOLIC ACID 5 MG/ML IJ SOLN: 1.0000 mg | Freq: Every day | INTRAMUSCULAR | Status: DC

## 2018-02-09 NOTE — Progress Notes (Signed)
Subjective:  Patient confused and agitated overnight, trying to pull PIVs out. Received Ativan 1 mg x1 and sitter ordered. This morning, patient appears drowsy but arousable to voice. He is oriented x 3. Answers questions appropriately at times and mumbles words at other times. His last dose of Ativan was at 6AM. He did not voice any complaints this morning other than being wet.   Objective:  Vital signs in last 24 hours: Vitals:   02/08/18 0606 02/08/18 0855 02/09/18 0130 02/09/18 0600  BP: (!) 147/96 (!) 137/100  (!) 155/94  Pulse: (!) 105 (!) 121 (!) 105   Resp: _0 Temp: 98.2 F (36.8 C) 99.6 F (37.6 C)    TempSrc: Oral Oral    SpO2: 100% 95%  97%  Weight:      Height:       Physical Exam  Constitutional: He is oriented to person, place, and time. No distress.  Somnolent, but arousable to voice. Mumbling words at times.   Cardiovascular: Normal rate, regular rhythm and normal heart sounds. Exam reveals no gallop and no friction rub.  No murmur heard. Pulmonary/Chest: Effort normal. No respiratory distress.  Abdominal: Soft. Bowel sounds are normal. He exhibits no distension. There is no tenderness.  Musculoskeletal: He exhibits no edema.  Neurological: He is alert and oriented to person, place, and time.  Able to move all four extremities spontaneously   Skin: He is not diaphoretic.    Assessment/Plan:  Principal Problem:   Acute pancreatitis Active Problems:   AKI (acute kidney injury) ([place] [facility])   Alcohol abuse   Hypokalemia   Cholelithiasis   Hyponatremia   Chronic pain syndrome  Wesley [name] is a 52 yo male with a history of COPD, palpitations, spontaneous pneumothorax, and current tobacco use who presents to the ED with complain of abdominal pain and found to have elevated lipase to 1500 consistent with acute pancreatitis.   # Refeeding syndrome: Patient presented with acute pancreatitis. He did not appeared malnourished on presentation but did report  several days of decreased appetite and has a history of alcohol use disorder. He was started on IVF with LR. CL diet ordered on night of admission per patient's request and has been tolerating it well. He became acutely encephalopathic, hyponatremic and hypokalemic yesterday. Received a total of 120 mEq of K. Mag 1.7 and repleted. Phos <1. TSH normal. This AM Na 129 and K 3.1.  - Continue to monitor electrolytes and replete PRN  - Repleting phos, will recheck Phos 6 hours after completion of infusion  - Repeat BMP in PM   #Acute pancreatitis:Alcoholic vs. Gallstone pancreatitis. Lipid panel normal. Patient is altered and not able to voice complaints. I did not appreciate any discomfort on abdominal palpation.  Original plan for lap chole today versus later this week.  However, TBIli uptrending and surgery recommending ERCP vs MRCP as no indication for urgent surgery at this time and removing gallbladder will not help with pancreatitis or CBD obstruction.  They will continue to follow. - Surgery consult, appreciate recommendations   # Oliguric AKI: Resolved   # Alcoholuse disorder:Patient reports daily drinking, 2-3 16 oz Margaritas, more in the weekend. Last drink 5-6 days ago. No history of withdrawals.  - CIWA protocol with Ativan ordered  # COPD:no PFTs to review. Not on inhalers at home - Will continue to monitor respiratory status   Dispo: Anticipated discharge in approximately 2-4 [place](s).    Welford Roche, MD 02/09/2018, 7:13 AM  Pager: [phone]

## 2018-02-09 NOTE — Progress Notes (Signed)
[facility] NIGHT FLOAT INTERVAL PROGRESS NOTE  Paged by RN at ~8:30pm that patient was becoming physically and verbally abusive towards staff and attempting to get out of bed/removing lines and tele. Went to bedside to evaluate patient.  Upon entering the room, patient was resting comfortably in a low set bed. Sitter noted that he had been pointing to things in the room that were not there and was concerned that he was hallucinating. He denied any complaints, but stated that he wanted to go home. I advised that we are still working on treating a few things and that we wanted to ensure he was safe to leave before we send him home. He started asking about whether "Allies from other countries are coming" and something about the "[place] people". I then continued to try to ask him further questions, but he seemed to have closed his eyes and fallen asleep. Per RN assessment just before we entered the room, he was alert and oriented x3. CIWA score was calculated at 15, however he had just received a dose of Ativan at 6:30pm and was not due for the next one. RN expressed concern that he was requiring more Ativan than the every 6 hours that was ordered per his CIWA protocol.  Assessment/Plan: He seems to be alternating between periods of extreme agitation and periods of lethargy. Concern for agitation/confusion secondary to alcohol withdrawal vs hypophosphatemia secondary to refeeding syndrome. Phosphorus from earlier today was undetectable, which has been repleted. With continued agitation and confusion tonight s/p repletion, there is increasing concern that there may be more of a component of alcohol withdrawal contributing to his mental status changes. Patient seems to be requiring more Ativan than what is currently ordered for him. - Transfer to stepdown (informed that pt should be able to stay in the same room) - CIWA with Ativan per Stepdown protocol (with hold parameters) - Check BMET and Phos now, will replete  electrolytes as needed  Colbert Ewing, MD Internal Medicine, PGY-1
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
## 2018-02-09 NOTE — Progress Notes (Signed)
Responded to bedside at request of primary RN for AMS and concern for evolving sepsis state.    On exam, pt is restless in bed, visible tremors present to bilateral hands.  Sitter at bedside states that the pt has been pointing to things in the room that aren't there, and is concerned that he is hallucinating.  The pt is alert, oriented x 3, and is able to do serial addition correctly.  Respirations are equal and unlabored, lungs are clear.  BP stable, HR remains elevated since admission at 100-120.  The pt is afebrile.    CIWA was calculated at 15, I recommended that the primary RN administer the ordered PRN ativan.  MD was notified of the pt's condition by the primary RN prior to my arrival.    Pt was placed on our radar, we will continue to follow.

## 2018-02-09 NOTE — Progress Notes (Signed)
Pt becoming increasingly confused, pulling and removing tele and  PIV x 3. Pt able to be re-oriented but for short periods of time. CIWA at 2316 score of 10. Pt scoring 13 at 0130. On-call resident, Ronalee Red, made aware and at bedside to assess.

## 2018-02-09 NOTE — Progress Notes (Signed)
Subjective: Patient was somnolent this AM. Was slightly awake for some questions, but overall seemed tired and sleepy. He denied any pain or headache. He was able say his name and state that he was at the hospital, but was unable to answer follow up questions about the year. He started to [facility] to himself and seemed delirious. Overnight, nurse reported that patient was agitated and started pulling the tele and PIV around 0130 with a CIWA score of 13. Night team evaluated patient and he was not oriented to place, but denied hallucinations. Early this AM, he was confused, disoriented with a CIWA score of 18 so a safety sitter was ordered for patient safety.    Objective: Vital signs in last 24 hours: Vitals:   02/08/18 0606 02/08/18 0855 02/09/18 0130 02/09/18 0600  BP: (!) 147/96 (!) 137/100  (!) 155/94  Pulse: (!) 105 (!) 121 (!) 105   Resp: _0 Temp: 98.2 F (36.8 C) 99.6 F (37.6 C)    TempSrc: Oral Oral    SpO2: 100% 95%  97%  Weight:      Height:       Weight change:   Intake/Output Summary (Last 24 hours) at 02/09/2018 1127 Last data filed at 02/09/2018 0602 Gross per 24 hour  Intake 1202.5 ml  Output 1340 ml  Net -137.5 ml   Physical Exam  Constitutional: He is well-developed, well-nourished, and in no distress. No distress.  HENT:  Head: Normocephalic and atraumatic.  Eyes: No scleral icterus.  Neck: Normal range of motion. Neck supple.  Cardiovascular: Regular rhythm. Exam reveals no friction rub.  No murmur heard. Tachycardic  Pulmonary/Chest: Effort normal and breath sounds normal. No respiratory distress. He has no wheezes.  Abdominal: Soft. There is no tenderness.  Hypoactive bowel sounds, mild distension  Musculoskeletal: Normal range of motion.  Neurological:  Somnolent, oriented to only person and place. Arousable to voice  Skin: Skin is warm. No rash noted.     Lab Results: _1 @ Micro Results: No results found for this or any previous  visit (from the past 240 hour(s)). Studies/Results: US Abdomen Limited Ruq  Result Date: 02/07/2018 CLINICAL DATA:  Abdominal pain for 4 days EXAM: ULTRASOUND ABDOMEN LIMITED RIGHT UPPER QUADRANT COMPARISON:  CT 02/07/2018 FINDINGS: Gallbladder: Gallstones within the gallbladder, the largest measuring 9 mm. A large amount of sludge noted in the gallbladder. No wall thickening. Negative sonographic Murphy's. Common bile duct: Diameter: Normal caliber, 6 mm Liver: Increased echotexture compatible with fatty infiltration. No focal abnormality or biliary ductal dilatation. Portal vein is patent on color Doppler imaging with normal direction of blood flow towards the liver. IMPRESSION: Fatty infiltration of the liver. Cholelithiasis and sludge within the gallbladder. No sonographic evidence of acute cholecystitis. Electronically Signed   By: Rolm Baptise M.D.   On: 02/07/2018 11:36   Medications: I have reviewed the patient's current medications. Scheduled Meds: . folic acid  1 mg Intravenous Daily  . nicotine  21 mg Transdermal Daily  . polyethylene glycol  17 g Oral Daily  . thiamine  100 mg Oral Daily   Or  . thiamine  100 mg Intravenous Daily   Continuous Infusions: . sodium chloride 50 mL/hr at 02/09/18 0833  . potassium chloride 10 mEq (02/09/18 1047)   PRN Meds: LORazepam, ondansetron (ZOFRAN) IV Assessment/Plan: Principal Problem:   Acute pancreatitis Active Problems:   AKI (acute kidney injury) ([place])   Alcohol abuse   Hypokalemia   Cholelithiasis   Hyponatremia  [name] is a 52 yo male with a significant history of COPD, alcohol abuse and tobacco use who presented to the ED with abdominal pain, nausea and vomiting and found to have dehydration and elevated lipase consistent with acute pancreatitis. He was admitted to the inpatient unit and the specific problems addressed are:  Acute pancreatitis  Pt denies any pain, but is altered on exam. Lipase is down to 171. Normal  lipid panel. Mild hyperbilirubinemia at 3.8 but trending up, which points to a gallstone pancreatitis picture. GI believes it is alcohol induced and recommends laparoscopic cholecystomy. Surgery recommending ERCP or MRCP before surgery. They will continue to see patient. -Surgery consult, appreciate recs -MRCP vs ERCP when mental status improves -IVF resuscitation -NPO @ midnight  Hyponatremia On admission, patient with normal sodium level. Pt did not seem malnourished but stated he had not been able to keep anything down due to the abd pain. Na started trending down after admission and fluid resuscitation. Had been on clear diet since admission and tolerating it well. Overnight, developed acute encephalopathy with agitation. Labs significant for Na of 127, K+ 3.1, Osm of 258, Uosm 256, Urine Na 93. Na up to 129 this AM. This points to a hypotonic hyponatremia. With  Patient being in a euvolemic state, this could be a result of hypothroidism, SIADH or refeeding syndrome. TSH (1.106) normal so hypothyroidism is unlikely. Pt does not have any medical problems or medications that will cause SIADH. Most likely etiology will be refeeding syndrome 2/2 to the metabolic stress placed on the body by the pancreatitis and refeeding after mild starvation. Other electrolyte disturbances such as low phos (<1) and Mg of 1.6 supports this diagnoses. Will continue to assess electrolytes and replete as needed. -Continue to monitor electrolytes and replete -Assess mental status -Repeat BMP  AKI Resolved. Cr down to 0.68. Will hydrate and monitor creatinine trends. -IVFhydration -Strict I/Os -Follow Cr trend  Alcohol use disorder Hypertensive and tachycardic today, with signs of alcohol withdraw. Extensive history of alcohol use with family history of father that passed away from cirrhosis at the age of 32. Drinks 2-3 16 oz cups of margarita and Tequila during the week, 4 on the weekends. Last drink 6 days ago, no  hx of withdrawal or seizures. Has gone a week without drinking and had no symptoms. Expressed the need to cut down his drinking today. Given thiamine and Magnesium. Will continue to monitor for signs of withdraw. -Continue CIWA protocol with Ativan   Hypokalemia K+ 3.1 today after 90 mEq of KCl yesterday. Likely due to dehydration and chronic alcohol use.   Will continue to replete with another and hydrate.  -KCl 40 mEq PO  -IVNS @ 50 mL/hr -Trend K+ and monitor for symptoms  COPD No breathing issues since admission. Not currently on any inhalers at home. Normal lung exam.  -Monitor respiratory status  FEN/GI -NPO for possible surgery -IVNS 50 mL/hr Strict I/Os  Code Status: Full Code  Dispo:Home pending clinical improvement  This is a Careers information officer Note.  The care of the patient was discussed with Dr. Isac Sarna and the assessment and plan formulated with their assistance.  Please see their attached note for official documentation of the daily encounter.   LOS: 2 days   Lacinda Axon, Medical Student 02/09/2018, 11:27 AM
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
## 2018-02-09 NOTE — Progress Notes (Signed)
Pt with CIWA of 18 at this time. Now unable to be redirected to follow commands or be reoriented. Teaching service notified of pt status, and administration of 53m IV ativan per mar for CIWA > 8. Pt moved to low bed, and order placed for safety sitter at bedside for pt safety. Additional dose of ativan ordered. Will continue to monitor closely.

## 2018-02-09 NOTE — Progress Notes (Signed)
Pt. Is altered from baseline yesterday, was alert and oriented, could ambulatory and independent. Today, patient is unsteady on his feet, alert and oriented x 2-3.   Agricultural consultant notified. MD's also notified by this RN's concerns, they are doing a work up on patient to determine if it is more than alcoholic delirium.   This RN, made rapid aware after Sepsis protocol appeared on screen. Patient placed on rapids radar and assessed by Rapid response RN.  Will continue to monitor.

## 2018-02-09 NOTE — Progress Notes (Signed)
IMTS NIGHT FLOAT INTERVAL PROGRESS NOTE  Paged by RN at ~1:30am that patient was becoming increasingly confused/agitated compared to her prior assessment. She noted that he was attempting to remove multiple PIVs and getting out of bed every 15 minutes. 39m of Ativan was given at ~11:15pm for a CIWA score of 10. Went to bedside to evaluate patient.  Upon entering the room, Wesley [name] was resting comfortably in bed with the lights off. When asked if anything was wrong or bothering him, he replied no. He denied pain or any other complaints at this time. He was alert and oriented x2 (thought he was at his aunt Wesley [name]'s house). He denied auditory or visual hallucinations. He was able to tell [place] he came to the hospital Sunday because he couldn't keep anything down and that he thought the inflammation in his abdomen was getting better. We provided redirection and encouraged him to ask his nurse if he needed anything. We encouraged him to leave the PIVs, as that was how he was getting his medications and fluids. He was able to converse with [place] acknowledged understanding.  Assessment/Plan: On admission, patient reported that he had not had an alcoholic beverage in about 5 days. Unless he was not being truthful in this statement, he would be out of the timeframe of typical withdrawal symptoms. Aside from the 1 dose of Ativan he was given tonight, he has not required any further doses of Ativan. However, we discussed with the RN to continue close monitoring for possible withdrawal symptoms. Other possible etiologies for his disorientation include delirium vs hyponatremia. Will continue to monitor closely.  JColbert Ewing MD Internal Medicine, PGY-1

## 2018-02-09 NOTE — Care Management Note (Addendum)
Case Management Note  Patient Details  Name: Orlandus Borowski Branford MRN: [id number] Date of Birth: 1966/07/09  Subjective/Objective:         Acute pancreatitis,  hx of alcohol use disorder and COPD. Independent with ADL's PTA, no DME usage.         Derek Jack [facility]      PCP: Marya Landry   02/11/2018 -    s/p MRCP              Action/Plan: Transition to home when medically stable...Marland KitchenCM following for disposition needs.  Expected Discharge Date:                  Expected Discharge Plan:  Home/Self Care  In-House Referral:     Discharge planning Services  CM consult  Post Acute Care Choice:   N/A Choice offered to:   N/A  DME Arranged:   N/A DME Agency:   N/A  HH Arranged:   N/A HH Agency:   N/A  Status of Service:  In process, will continue to follow  If discussed at Long Length of Stay Meetings, dates discussed:    Additional Comments:  Sharin Mons, RN 02/09/2018, 9:26 AM
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
## 2018-02-09 NOTE — Progress Notes (Signed)
CRITICAL VALUE ALERT  Critical Value:  Phosphorus <0.01  Date & Time Notied:  02/09/18 1157  Provider Notified: Yes  Orders Received/Actions taken: Phosphorus IVPB

## 2018-02-10 DIAGNOSIS — F10939 Alcohol use, unspecified with withdrawal, unspecified: Secondary | ICD-10-CM | POA: Diagnosis not present

## 2018-02-10 DIAGNOSIS — F10239 Alcohol dependence with withdrawal, unspecified: Secondary | ICD-10-CM | POA: Diagnosis not present

## 2018-02-10 LAB — BASIC METABOLIC PANEL
Anion gap: 12 (ref 5–15)
BUN: <5 mg/dL — ABNORMAL LOW (ref 6–20)
CO2: 22 mmol/L (ref 22–32)
Calcium: 8.7 mg/dL — ABNORMAL LOW (ref 8.9–10.3)
Chloride: 93 mmol/L — ABNORMAL LOW (ref 101–111)
Creatinine, Ser: 0.66 mg/dL (ref 0.61–1.24)
GFR calc Af Amer: >60 mL/min (ref >60)
GFR calc non Af Amer: >60 mL/min (ref >60)
Glucose, Bld: 71 mg/dL (ref 65–99)
Potassium: 3.5 mmol/L (ref 3.5–5.1)
Sodium: 127 mmol/L — ABNORMAL LOW (ref 135–145)

## 2018-02-10 LAB — CBC
HCT: 41.3 % (ref 39.0–52.0)
Hemoglobin: 14.8 g/dL (ref 13.0–17.0)
MCH: 34.3 pg — ABNORMAL HIGH (ref 26.0–34.0)
MCHC: 35.8 g/dL (ref 30.0–36.0)
MCV: 95.6 fL (ref 78.0–100.0)
Platelets: 243 10*3/uL (ref 150–400)
RBC: 4.32 MIL/uL (ref 4.22–5.81)
RDW: 13.1 % (ref 11.5–15.5)
WBC: 13.4 10*3/uL — ABNORMAL HIGH (ref 4.0–10.5)

## 2018-02-10 LAB — HEPATIC FUNCTION PANEL
ALT: 47 U/L (ref 17–63)
AST: 83 U/L — ABNORMAL HIGH (ref 15–41)
Albumin: 2.8 g/dL — ABNORMAL LOW (ref 3.5–5.0)
Alkaline Phosphatase: 115 U/L (ref 38–126)
Bilirubin, Direct: 1.2 mg/dL — ABNORMAL HIGH (ref 0.1–0.5)
Indirect Bilirubin: 1.9 mg/dL — ABNORMAL HIGH (ref 0.3–0.9)
Total Bilirubin: 3.1 mg/dL — ABNORMAL HIGH (ref 0.3–1.2)
Total Protein: 7.2 g/dL (ref 6.5–8.1)

## 2018-02-10 LAB — PHOSPHORUS: Phosphorus: 2.5 mg/dL (ref 2.5–4.6)

## 2018-02-10 LAB — MAGNESIUM: Magnesium: 1.8 mg/dL (ref 1.7–2.4)

## 2018-02-10 MED ORDER — MAGNESIUM SULFATE 2 GM/50ML IV SOLN
2.0000 g | Freq: Once | INTRAVENOUS | Status: AC
  Administered 2018-02-10: 2 g via INTRAVENOUS
  Filled 2018-02-10: qty 50

## 2018-02-10 MED ORDER — POTASSIUM PHOSPHATES 15 MMOLE/5ML IV SOLN
20.0000 mmol | Freq: Once | INTRAVENOUS | Status: AC
  Administered 2018-02-10: 20 mmol via INTRAVENOUS
  Filled 2018-02-10: qty 6.67

## 2018-02-10 MED ORDER — POTASSIUM CHLORIDE 10 MEQ/100ML IV SOLN
10.0000 meq | INTRAVENOUS | Status: AC
Start: 2018-02-10 — End: 2018-02-11
  Administered 2018-02-10 – 2018-02-11 (×2): 10 meq via INTRAVENOUS

## 2018-02-10 MED ORDER — POTASSIUM CHLORIDE 10 MEQ/100ML IV SOLN
10.0000 meq | INTRAVENOUS | Status: AC
  Filled 2018-02-10 (×2): qty 100

## 2018-02-10 MED ORDER — CHLORDIAZEPOXIDE HCL 25 MG PO CAPS
25.0000 mg | ORAL_CAPSULE | Freq: Three times a day (TID) | ORAL | Status: DC
  Administered 2018-02-10 – 2018-02-13 (×8): 25 mg via ORAL
  Filled 2018-02-10 (×8): qty 1

## 2018-02-10 NOTE — Progress Notes (Signed)
Subjective: Pt laying in bed naked with only a towel covering her private parts. Per nurse, patient has been combative, tried to remove his mitten and has removed any blanket used to cover him. Last night, nurse contacted the night team to report that patient was combative and agitated. Patient was hallucinating at the time but was oriented x3. CIWA was 15 and patient received scheduled Ativan. Nurse also called rapid response after a sepsis alert popped up on her screen. Rapid Response Nurse evaluated patient who was still hallucinating, but alert and oriented x3. This AM, patient was somnolent and sleepy, he mumbled answers to questions, but seem to fall in and out throughout the encounter. He was however oriented x3.   Objective: Vital signs in last 24 hours: Vitals:   02/10/18 0620 02/10/18 0643 02/10/18 0853 02/10/18 0950  BP:      Pulse:  (!) 108  (!) 147  Resp:      Temp: 99.2 F (37.3 C)  98.2 F (36.8 C)   TempSrc: Oral  Oral   SpO2:      Weight:      Height:       Weight change:   Intake/Output Summary (Last 24 hours) at 02/10/2018 1258 Last data filed at 02/10/2018 1308 Gross per 24 hour  Intake 1138.75 ml  Output 3300 ml  Net -2161.25 ml   Physical Exam  Constitutional: He is oriented to person, place, and time and well-developed, well-nourished, and in no distress.  HENT:  Head: Normocephalic and atraumatic.  Neck: Normal range of motion. Neck supple.  Cardiovascular: Regular rhythm and normal heart sounds. Exam reveals no friction rub.  No murmur heard. tachycardic  Pulmonary/Chest: Effort normal and breath sounds normal. No respiratory distress. He has no wheezes.  Abdominal: Soft. Bowel sounds are normal. He exhibits no mass. There is no tenderness.  Musculoskeletal: Normal range of motion.  Neurological: He is oriented to person, place, and time.  Somnolent, slow to respond, mumbled his answers to questions  Skin: Skin is warm. No rash noted.   Lab  Results: _0 @ Micro Results: No results found for this or any previous visit (from the past 240 hour(s)). Studies/Results: No results found. Medications: I have reviewed the patient's current medications. Scheduled Meds: . chlordiazePOXIDE  25 mg Oral TID  . folic acid  1 mg Intravenous Daily  . nicotine  21 mg Transdermal Daily  . polyethylene glycol  17 g Oral Daily  . thiamine  100 mg Oral Daily   Continuous Infusions: . sodium chloride 50 mL/hr at 02/09/18 2259  . potassium PHOSPHATE IVPB (mmol)     PRN Meds:.LORazepam, ondansetron (ZOFRAN) IV Assessment/Plan: Principal Problem:   Alcohol withdrawal ([place]) Active Problems:   Acute pancreatitis   AKI (acute kidney injury) ([place])   Alcohol abuse   Hypokalemia   Cholelithiasis   Hyponatremia   Refeeding syndrome  [name] is a 52 yo male with a significant history of COPD, alcohol abuse and tobacco use who presented to the ED with abdominal pain, nausea and vomiting and found to have dehydration and elevated lipase consistent with acute pancreatitis. He was admitted to the inpatient unit and the specific problems addressed are:  Acute pancreatitis Still altered on exam. Lipase is down to 171. Normal lipid panel. Bilirubin trending down (3.1). Surgery recommending ERCP or MRCP before lap cholecystectomy  later this week. GI has re-ordered MRCP but concerned patient will not be able to cooperate for the test. Waiting for improvement  in mental status. They will continue to see patient. Will continue to treat for alcohol withdraw and reassess.  -Surgery consult, appreciate recs -MRCP ordered -IVF resuscitation -NPO @ midnight  Hypotonic Hyponatremia No significant improvement. Na of 127, K+ 3.5, Mag 1.8, Phosp 2.5. Patient still altered and somnolent. With patient being in a euvolemic state, this could also be a result of hypothyroidism or SIADH. TSH (1.106) normal so hypothyroidism is unlikely. Pt does not have any  medical problems or medications that will cause SIADH. Most likely etiology will be refeeding syndrome 2/2 to the metabolic stress placed on the body by the pancreatitis and refeeding after mild starvation. Slight improvement in mag and phos. Will continue to assess electrolytes and replete as needed. -Continue to monitor electrolytes and replete -Assess mental status -Repeat BMP  AKI Resolved. Cr down to 0.66.Will hydrate and monitor creatinine trends. -IVFhydration -Strict I/Os -Follow Cr trend  Alcohol withdrawal  Worsening withdraw with confusion, hallucination, agitation and combative behavior. Still hypertensive and tachycardic today.Somnolent and slow to respond on exam. These could also be symptoms of delirium 2/2 to recent diagnosis of pancreatitis however, patient is oriented x3. Extensive history of alcohol use (40 years) with family history of father that passed away from cirrhosis at the age of 36. Drinks 2-3 16 oz cups of margarita and Tequila during the week, 4 on the weekends. Reports no hx of withdraw or seizures. Patient requiring more ativan than currently ordered. Plan to transfer to stepdown. Will also start a long-acting benzo to treat withdraw symptoms.  -Start Librium capsule 25 mg PO TID -ContinueCIWA with Ativan per Stepdown protocol -Trend Mag, Phos and K+ and replete as needed -Thiamine 100 mg PO daily  Hypokalemia Improved. K+ 3.5 today after 90 mEq of KCl yesterday. Phosphorus decreased from 3.3 to 2.5. Likely due to refeeding syndrome. Will continue to replete electrolytes and hydrate. -KPhos 20 mmol in D5 -IVNS _0  mL/hr -Trend K+ and phos monitor for symptoms  COPD No breathing issues since admission.Not currently on any inhalers at home. Normal lung exam.  -Monitor respiratory status  FEN/GI -NPO for possible surgery -IVNS50 mL/hr Strict I/Os  Code Status: Full Code  Dispo:Surgery pending clinical improvement  This is a Location manager Note.  The care of the patient was discussed with Dr.  Isac Sarna and the assessment and plan formulated with their assistance.  Please see their attached note for official documentation of the daily encounter.   LOS: 3 days   Lacinda Axon, Medical Student 02/10/2018, 12:58 PM

## 2018-02-10 NOTE — Progress Notes (Signed)
Subjective:  Patient became agitated overnight. He was pointing at objects that were not there and disoriented. He was also trying to pull PIVs out and was verbally and physically aggressive with staff. This morning his mentation is not significantly changed from yesterday. He is arousable to voice. He mostly whispers and is oriented to self, place, and year. He did not voice any complaints this morning. Explained to patient we will continue to treat him for alcohol withdrawal . Patient unable to verbalize understanding at this time.    Objective:  Vital signs in last 24 hours: Vitals:   02/10/18 0620 02/10/18 0643 02/10/18 0853 02/10/18 0950  BP:      Pulse:  (!) 108  (!) 147  Resp:      Temp: 99.2 F (37.3 C)  98.2 F (36.8 C)   TempSrc: Oral  Oral   SpO2:      Weight:      Height:       Physical Exam  Constitutional:  Patient remains altered but arousable to voice. He is lying in bed with arms crossed in no acute distress.   Cardiovascular:  Tachycardic with regular rhythm. No murmurs, rubs, or gallops noted. No JVD.   Pulmonary/Chest: Effort normal. No respiratory distress.  Abdominal: Soft. Bowel sounds are normal. He exhibits no distension. There is no tenderness.  Musculoskeletal: He exhibits no edema or deformity.  Neurological:  Decreased mentation, arousable to voice. Oriented to self, place, and time. He mostly whispers answers to questions and very difficult to understand.   Skin: Skin is warm. No rash noted. No erythema.    Assessment/Plan:  Principal Problem:   Alcohol withdrawal ([place]) Active Problems:   Acute pancreatitis   AKI (acute kidney injury) ([place])   Alcohol abuse   Hypokalemia   Cholelithiasis   Hyponatremia   Refeeding syndrome  # Alcohol withdrawal: Remains drowsy, unable to answer questions, and tachycardic with HR 100-120s. Has required a total of 10 mg of  Ativan in the past 24 hours. Will start Librium as below. Will continue to monitor  patient closely as concern for other causes for acute delirium other then alcohol withdrawal.  - Start Librium 25 mg TID  - Ativan PRN per CIWA protocol   # Refeeding syndrome: K 3.5, Mag 1.8, and Phos 2.5. Hyponatremia stable with Na 127 today. Will continue to monitor electrolytes and replete as needed.  - Continue to monitor electrolytes and replete PRN  - Repleting phos, will recheck Phos 6 hours after completion of infusion   #Acute pancreatitis:Alcoholic vs. Gallstone pancreatitis. Lipid panel normal.Surgery recommending MRCP for further evaluation due to elevated TBili. Unfortunately, patient is in no condition to undergo this test given altered mentation. TBili trending down today. Surgery will evaluate patient later this week.  - Surgery and GI following, appreciate recommendations   # Alcoholuse disorder:Patient reports daily drinking, 2-3 16 oz Margaritas, more in the weekend. Last drink 5-6 days ago. No history of withdrawals.  - Management as above   # COPD:no PFTs to review. Not on inhalers at home - Will continue to monitor respiratory status   Dispo: Anticipated discharge in approximately 2-4 Henegar(s).   Welford Roche, MD 02/10/2018, 12:16 PM Pager: [phone]
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
## 2018-02-10 NOTE — Progress Notes (Signed)
Patient ID: Wesley [name], male   DOB: [birth year], 52 y.o.   MRN: [id number]       Subjective: Patient with agitation this morning and sitting up in bed naked.  He is oriented, but he is pulling off his mittens and says "I've got to get out of here and see my primary doctor."  He denies abdominal pain.  Objective: Vital signs in last 24 hours: Temp:  [98.2 F (36.8 C)-99.2 F (37.3 C)] 98.2 F (36.8 C) (03/13 0853) Pulse Rate:  [93-113] 108 (03/13 0643) Resp:  [18-21] 18 (03/13 0548) BP: (125-165)/(74-110) 125/83 (03/13 0548) SpO2:  [95 %-100 %] 95 % (03/13 0348) Last BM Date: 02/03/18  Intake/Output from previous [place]: 03/12 0701 - 03/13 0700 In: 1138.8 [I.V.:1138.8] Out: 3300 [Urine:3300] Intake/Output this shift: No intake/output data recorded.  PE: Gen: agitated, pulling at mittens Heart: very tachy, in the 140s on the monitor Lungs: CTAB Abd: soft, NT, Nd, +BS  Lab Results:  Recent Labs    02/09/18 0442 02/10/18 0423  WBC 13.9* 13.4*  HGB 14.2 14.8  HCT 38.7* 41.3  PLT 205 243   BMET Recent Labs    02/09/18 2124 02/10/18 0423  NA 127* 127*  K 4.0 3.5  CL 91* 93*  CO2 24 22  GLUCOSE 108* 71  BUN <5* <5*  CREATININE 0.77 0.66  CALCIUM 8.6* 8.7*   PT/INR No results for input(s): LABPROT, INR in the last 72 hours. CMP     Component Value Date/Time   NA 127 (L) 02/10/2018 0423   K 3.5 02/10/2018 0423   CL 93 (L) 02/10/2018 0423   CO2 22 02/10/2018 0423   GLUCOSE 71 02/10/2018 0423   BUN <5 (L) 02/10/2018 0423   CREATININE 0.66 02/10/2018 0423   CALCIUM 8.7 (L) 02/10/2018 0423   PROT 7.2 02/10/2018 0423   ALBUMIN 2.8 (L) 02/10/2018 0423   AST 83 (H) 02/10/2018 0423   ALT 47 02/10/2018 0423   ALKPHOS 115 02/10/2018 0423   BILITOT 3.1 (H) 02/10/2018 0423   GFRNONAA >60 02/10/2018 0423   GFRAA >60 02/10/2018 0423   Lipase     Component Value Date/Time   LIPASE 171 (H) 02/09/2018 0442       Studies/Results: No results  found.  Anti-infectives: Anti-infectives (From admission, onward)   None       Assessment/Plan EtOH - CIWA protocol  Radiculopathy- home meds Hypokalemia- 3.5 today Tachycardia - now in 140s upon my arrival.  Trying to get in touch with primary service to make them aware of worsening tachy, appears to be sinus  Gallstone pancreatitis - lipase trending down 171 yesterday, Tbili down to 3.1 today indirect is 1.9 while direct is only 1.2, AST down to 83 today, ALT WNL - WBC 13.4, afebrile - CT 3/10: pancreatitis and gallstones, no CBD dilatation  - RUQ [place] 3/10: gallstones and sludge, no gallbladder wall thickening or CBD dilatation  - MRCP reordered by GI today  FEN: NPO, IVF VTE: SCDs, SQ heparin may resume today as no plans for OR currently ID: no current abx  Plan: Pt's Tbili has trended down slightly.  MRCP has been reordered by GI.  Will await these results. Pt does not have acute cholecystitis and emergent/ urgent surgery is not indicated. Removing his gallbladder will not help his pancreatitis or CBD obstruction.  GI needs to evaluate to clear common bile duct. OR for laparoscopic cholecystectomy later this week pending his labs and ETOH withdrawl. We will follow  for surgical readiness.   LOS: 3 days    Henreitta Cea , [facility] Surgery 02/10/2018, 9:48 AM Pager: [phone] Consults: [phone] Mon-Fri 7:00 am-4:30 pm Sat-Sun 7:00 am-11:30 am

## 2018-02-11 ENCOUNTER — Inpatient Hospital Stay (HOSPITAL_COMMUNITY): Payer: Self-pay

## 2018-02-11 LAB — HEPATIC FUNCTION PANEL
ALT: 44 U/L (ref 17–63)
AST: 75 U/L — ABNORMAL HIGH (ref 15–41)
Albumin: 2.7 g/dL — ABNORMAL LOW (ref 3.5–5.0)
Alkaline Phosphatase: 102 U/L (ref 38–126)
Bilirubin, Direct: 1.1 mg/dL — ABNORMAL HIGH (ref 0.1–0.5)
Indirect Bilirubin: 1.8 mg/dL — ABNORMAL HIGH (ref 0.3–0.9)
Total Bilirubin: 2.9 mg/dL — ABNORMAL HIGH (ref 0.3–1.2)
Total Protein: 6.9 g/dL (ref 6.5–8.1)

## 2018-02-11 LAB — BASIC METABOLIC PANEL
Anion gap: 11 (ref 5–15)
BUN: <5 mg/dL — ABNORMAL LOW (ref 6–20)
CO2: 21 mmol/L — ABNORMAL LOW (ref 22–32)
Calcium: 8.1 mg/dL — ABNORMAL LOW (ref 8.9–10.3)
Chloride: 93 mmol/L — ABNORMAL LOW (ref 101–111)
Creatinine, Ser: 0.73 mg/dL (ref 0.61–1.24)
GFR calc Af Amer: >60 mL/min (ref >60)
GFR calc non Af Amer: >60 mL/min (ref >60)
Glucose, Bld: 74 mg/dL (ref 65–99)
Potassium: 3.4 mmol/L — ABNORMAL LOW (ref 3.5–5.1)
Sodium: 125 mmol/L — ABNORMAL LOW (ref 135–145)

## 2018-02-11 LAB — MAGNESIUM: Magnesium: 2 mg/dL (ref 1.7–2.4)

## 2018-02-11 LAB — PHOSPHORUS: Phosphorus: 2.8 mg/dL (ref 2.5–4.6)

## 2018-02-11 MED ORDER — POTASSIUM CHLORIDE 10 MEQ/100ML IV SOLN
10.0000 meq | INTRAVENOUS | Status: AC
  Administered 2018-02-11 (×4): 10 meq via INTRAVENOUS
  Filled 2018-02-11 (×3): qty 100

## 2018-02-11 MED ORDER — GADOBENATE DIMEGLUMINE 529 MG/ML IV SOLN
10.0000 mL | Freq: Once | INTRAVENOUS | Status: AC | PRN
  Administered 2018-02-11: 10 mL via INTRAVENOUS

## 2018-02-11 MED ORDER — POTASSIUM CHLORIDE 10 MEQ/100ML IV SOLN
INTRAVENOUS | Status: AC
  Filled 2018-02-11: qty 100

## 2018-02-11 MED ORDER — GADOXETATE DISODIUM 0.25 MMOL/ML IV SOLN: 10.0000 mL | Freq: Once | INTRAVENOUS | Status: DC | PRN

## 2018-02-11 NOTE — Progress Notes (Signed)
Subjective: Patient was laying in bed in no acute distress. No events last night or overnight. Patient received only one dose of his scheduled Librium (TID) last night. He was sleepy but much more responsive this AM. He was oriented x3 and denies any hallucinations. He reports mild abdominal pain but denies any headache. Patient was taken to get his MRCP.   Objective: Vital signs in last 24 hours: Vitals:   02/11/18 0400 02/11/18 0600 02/11/18 0758 02/11/18 1305  BP: 124/90 (!) 157/97 (!) 148/98 (!) 143/101  Pulse:   (!) 101 99  Resp: (!) _0 Temp: 98.3 F (36.8 C)  98.9 F (37.2 C) 97.6 F (36.4 C)  TempSrc: Oral  Oral Oral  SpO2:   98% 97%  Weight:      Height:       Weight change:   Intake/Output Summary (Last 24 hours) at 02/11/2018 1331 Last data filed at 02/11/2018 4010 Gross per 24 hour  Intake 1545.83 ml  Output -  Net 1545.83 ml   Physical Exam  Constitutional: He is oriented to person, place, and time and well-developed, well-nourished, and in no distress.  HENT:  Head: Normocephalic and atraumatic.  Eyes: Pupils are equal, round, and reactive to light.  Neck: Normal range of motion. Neck supple.  Cardiovascular: Regular rhythm and normal heart sounds. Tachycardia present.  No murmur heard. Pulmonary/Chest: Effort normal and breath sounds normal. He has no wheezes.  Abdominal: Soft. Bowel sounds are normal.  Mild TTP of lower abdomen  Musculoskeletal: Normal range of motion.  Neurological: He is oriented to person, place, and time.  Somnolent, responsive to questions, improved attention  Skin: Skin is warm. No rash noted.   Lab Results: _1 @ Micro Results: No results found for this or any previous visit (from the past 240 hour(s)). Studies/Results: No results found. Medications: I have reviewed the patient's current medications. Scheduled Meds: . chlordiazePOXIDE  25 mg Oral TID  . folic acid  1 mg Intravenous Daily  . nicotine  21 mg  Transdermal Daily  . polyethylene glycol  17 g Oral Daily  . thiamine  100 mg Oral Daily   Continuous Infusions: PRN Meds:.LORazepam, ondansetron (ZOFRAN) IV Assessment/Plan: Principal Problem:   Alcohol withdrawal ([place]) Active Problems:   Acute pancreatitis   AKI (acute kidney injury) ([place])   Alcohol abuse   Hypokalemia   Cholelithiasis   Hyponatremia   Refeeding syndrome  [name] is a 52 yo male with a significant history of COPD, alcohol abuse and tobacco use who presented to the ED with abdominal pain, nausea and vomiting and found to have dehydration and elevated lipase consistent with acute pancreatitis. He was admitted to the inpatient unit and the specific problems addressed are:  Acute pancreatitis Mild abdominal pain, but improved from admission Lipase is down to 171. Normal lipid panel. Bilirubin trending down (2.9 today). Patient scheduled for MRCP today.GI will continue to assess patient. Surgery will re-evaluate patient for surgery. Will continue to treat for alcohol withdraw and reassess.  -Surgery consult, appreciate recs -F/u MRCP  -IVF resuscitation -Strict I/Os   Hypotonic Hyponatremia Slight decrease of Na to 125, K+ 3.4, Mag 2.0, Phosp 2.8. With patient being in a euvolemic state, this could also be a result of hypothyroidism or SIADH. TSH (1.106) normal so hypothyroidism is unlikely. Pt does not have any medical problems or medications that will cause SIADH. However, the SIADH could be 2/2 to reset osmostat. This can be confirmed by  observing response to water load (10-15 mL/kg PO or IV), however, this could acutely worsen the hyponatremia. Refeeding syndrome 2/2 to the metabolic stress placed on the body by the pancreatitis and refeeding after mild starvation is also on the differential. Mag and phos now normal. Will continue to assess electrolytes and replete as needed. Plan to first treat apparent SIADH with fluid restrictions and reassess. -Fluid  restrict <1500 mL/Ra and monitor -Continue to monitor electrolytes and replete -Continue to assess mental status -Repeat BMP  AKI Resolved. Cr 0.73 today.Will hydrate and monitor creatinine trends. -IVFhydration -Strict I/Os -Follow Cr trend  Alcohol withdrawal  Improving. No agitation or hallucination overnight. Still hypertensive and tachycardic today.Somnolent but responding to questions appropriately. These could also be symptoms of delirium 2/2 to recent diagnosis of pancreatitis however, patient is oriented x3 and attention grossly intact today. Extensive history of alcohol use (40 years) with family history of father that passed away from cirrhosis at the age of 67. Drinks 2-3 16 oz cups of margarita and Tequila during the week, 4 on the weekends. Reports no hx of withdraw or seizures. Patient finally reported that he had alcohol the [place] of his admission. This fits well with his the alcohol withdrawal timeframe. Patient only needed one dose of Librium last night. Still on CIWA stepdown protocol.  -Continue Librium capsule 25 mg PO TID -ContinueCIWA with Ativan per Stepdown protocol -Trend Mag, Phos and K+ and replete as needed -Thiamine 100 mg PO daily -PRN diazepam and lorazepam  Hypokalemia Borderline. K+3.4 today.Phosphorus back up to 2.8. Likely due to refeeding syndrome or reset osmostat. Will continue to replete electrolytes and restrict fluid intake. -K+10 mEq in 100 mL IVPB x4 doses -IVNS _0  mL/hr -Trend K+ Mg, phos and monitor for symptoms  COPD Tachypnea overnight to 31, but resolved this AM. Not currently on any inhalers at home. No respiratory distress on exam.  -Monitor respiratory status  FEN/GI -Soft diet -IVNS50 mL/hr Strict I/Os  Code Status: Full Code  Dispo:Surgery pending clinical improvement  This is a Careers information officer Note.  The care of the patient was discussed with Dr. Isac Sarna and the assessment and plan formulated with  their assistance.  Please see their attached note for official documentation of the daily encounter.   LOS: 4 days   Lacinda Axon, Medical Student 02/11/2018, 1:31 PM
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
## 2018-02-11 NOTE — Progress Notes (Signed)
Subjective/Chief Complaint: Patient is still disoriented - EtOH withdrawal Denies abdominal pain  Objective: Vital signs in last 24 hours: Temp:  [98.2 F (36.8 C)-98.8 F (37.1 C)] 98.3 F (36.8 C) (03/14 0400) Pulse Rate:  [108-147] 113 (03/14 0000) Resp:  [17-31] 18 (03/14 0600) BP: (120-157)/(86-102) 157/97 (03/14 0600) SpO2:  [95 %-99 %] 96 % (03/14 0000) Last BM Date: 02/03/18  Intake/Output from previous [place]: 03/13 0701 - 03/14 0700 In: 1545.8 [I.V.:1445.8; IV Piggyback:100] Out: -  Intake/Output this shift: No intake/output data recorded.   Lab Results:  Recent Labs    02/09/18 0442 02/10/18 0423  WBC 13.9* 13.4*  HGB 14.2 14.8  HCT 38.7* 41.3  PLT 205 243   BMET Recent Labs    02/10/18 0423 02/11/18 0258  NA 127* 125*  K 3.5 3.4*  CL 93* 93*  CO2 22 21*  GLUCOSE 71 74  BUN <5* <5*  CREATININE 0.66 0.73  CALCIUM 8.7* 8.1*   Hepatic Function Latest Ref Rng & Units 02/11/2018 02/10/2018 02/09/2018  Total Protein 6.5 - 8.1 g/dL 6.9 7.2 7.0  Albumin 3.5 - 5.0 g/dL 2.7(L) 2.8(L) 2.9(L)  AST 15 - 41 U/L 75(H) 83(H) 99(H)  ALT 17 - 63 U/L 44 47 50  Alk Phosphatase 38 - 126 U/L 102 115 102  Total Bilirubin 0.3 - 1.2 mg/dL 2.9(H) 3.1(H) 3.8(H)  Bilirubin, Direct 0.1 - 0.5 mg/dL 1.1(H) 1.2(H) 1.4(H)    PT/INR No results for input(s): LABPROT, INR in the last 72 hours. ABG No results for input(s): PHART, HCO3 in the last 72 hours.  Invalid input(s): PCO2, PO2  Studies/Results: No results found.  Anti-infectives: Anti-infectives (From admission, onward)   None      Assessment/Plan:  Pancreatitis - presumed to be biliary in origin, but EtOH abuse may also be part of the etiology.  There was no CBD dilation Bilirubin trending down slightly No clinical signs of acute choelcystitis EtOH withdrawal  There is no urgency to perform laparoscopic cholecystectomy on this admission.  His risk for complication is higher with active EtOH withdrawal.   If he remains asymptomatic, he may follow-up with [place] on an outpatient basis to schedule elective surgery.  We will sign off for now.  Please call us back if he develops symptoms during this admission.     LOS: 4 days    Maia Petties 02/11/2018

## 2018-02-11 NOTE — Progress Notes (Signed)
Subjective:  No acute events overnight. Patient continues to be drowsy but answers questions appropriately. He understands why he is in the hospital. He complains of diffuse epigastric pain. He did not voice any other complaints. Explained to patient we are continuing to treat him for alcohol withdrawal.   Objective:  Vital signs in last 24 hours: Vitals:   02/10/18 2200 02/11/18 0000 02/11/18 0200 02/11/18 0400  BP: (!) 135/97 (!) 127/93 120/86 124/90  Pulse:  (!) 113    Resp:  17 19 (!) 31  Temp:  98.8 F (37.1 C)  98.3 F (36.8 C)  TempSrc:  Oral  Oral  SpO2:  96%    Weight:      Height:       Physical Exam  Constitutional: He is oriented to person, place, and time. He appears well-developed and well-nourished.  Patient is lying in bed in no acute distress. He is drowsy but answers questions appropriately. He understands why he is in the hospital.   Cardiovascular: Normal rate, regular rhythm and normal heart sounds. Exam reveals no gallop and no friction rub.  No murmur heard. Pulmonary/Chest: Effort normal. No respiratory distress.  Abdominal: Soft. He exhibits no distension. There is tenderness.  Mild, diffuse tenderness with decreased bowel sounds   Musculoskeletal: He exhibits no edema.  Neurological: He is oriented to person, place, and time.  Drowsy but fully oriented     Assessment/Plan:  Principal Problem:   Alcohol withdrawal ([place]) Active Problems:   Acute pancreatitis   AKI (acute kidney injury) ([place])   Alcohol abuse   Hypokalemia   Cholelithiasis   Hyponatremia   Refeeding syndrome  # Alcohol withdrawal: Overal improving. Mentation improved today. He remains drowsy but answers questions appropriately and is fully oriented. Has received 3 doses of Librium total and no PRN Ativan in over 24 hours. HR improving, 80-120, and normotensive. Continue management as below.  - Start Librium 25 mg TID  - Ativan PRN per CIWA protocol   # Hyponatremia: SIADH vs  reset osmostat based on urine studies. Will fluid restrict.  - Fluid restriction  - BMP in AM   # Refeeding syndrome: K 3.4, Mag 2.0, and Phos 2.8.  - Continue to monitor electrolytes and replete PRN   #Acute pancreatitis:Alcoholic vs. Gallstone pancreatitis. Lipid panel normal.MRCP with mild peripancreatic edema, but no choledocholithiasis. Agree with surgery he is not a candidate for surgery at this time with active alcohol withdrawal. Surgery signed off. GI will continue to follow.  - Continue to monitor for symptoms. Plan to re-consult surgery if he develops symptoms  - GI following, appreciate recommendations   # Alcoholuse disorder:Patient reports daily drinking, 2-3 16 oz Margaritas, more in the weekend. On admission, he reported his last drink was 5-6 days prior to admission. No history of withdrawals. Today he reports his last drink was on the morning of admission.  - Management as above   # COPD:no PFTs to review. Not on inhalers at home - Will continue to monitor respiratory status   Dispo: Anticipated discharge in approximately 2-4 [place](s).   Welford Roche, MD 02/11/2018, 6:38 AM Pager: [phone]

## 2018-02-12 LAB — BASIC METABOLIC PANEL
Anion gap: 13 (ref 5–15)
BUN: 6 mg/dL (ref 6–20)
CO2: 22 mmol/L (ref 22–32)
Calcium: 8.8 mg/dL — ABNORMAL LOW (ref 8.9–10.3)
Chloride: 93 mmol/L — ABNORMAL LOW (ref 101–111)
Creatinine, Ser: 0.81 mg/dL (ref 0.61–1.24)
GFR calc Af Amer: >60 mL/min (ref >60)
GFR calc non Af Amer: >60 mL/min (ref >60)
Glucose, Bld: 63 mg/dL — ABNORMAL LOW (ref 65–99)
Potassium: 3.7 mmol/L (ref 3.5–5.1)
Sodium: 128 mmol/L — ABNORMAL LOW (ref 135–145)

## 2018-02-12 LAB — OSMOLALITY: Osmolality: 262 mOsm/kg — ABNORMAL LOW (ref 275–295)

## 2018-02-12 LAB — MAGNESIUM: Magnesium: 1.8 mg/dL (ref 1.7–2.4)

## 2018-02-12 LAB — PHOSPHORUS: Phosphorus: 3.1 mg/dL (ref 2.5–4.6)

## 2018-02-12 MED ORDER — FOLIC ACID 1 MG PO TABS
1.0000 mg | ORAL_TABLET | Freq: Every day | ORAL | Status: DC
  Administered 2018-02-12 – 2018-02-16 (×5): 1 mg via ORAL
  Filled 2018-02-12 (×5): qty 1

## 2018-02-12 NOTE — Progress Notes (Signed)
Subjective:  No acute events overnight. Tachycardic but hemodynamically stable. Alert and oriented when seen this morning. He was able to tolerate soft food for breakfast without any abdominal pain, nausea or vomiting. He voices no complaints and asks to go home today. Discussed with patient he continues to be tachycardic and was recently started on a diet and we would like to closely monitor him for the next 1-2 days and ensure his heart rate improved and he is tolerating PO intake. Also discussed importance of alcohol cessation and explained to patient this is the common factor between his current medical issues including pancreatitis, withdrawal and abnormalities in electrolytes. He voices understanding. [name], but agreeable to it.   Objective:  Vital signs in last 24 hours: Vitals:   02/11/18 2333 02/12/18 0033 02/12/18 0133 02/12/18 0644  BP: (!) 122/91 114/85 110/76 (!) 132/97  Pulse:   (!) 125 (!) 107  Resp: (!) 24 17 (!) 24 17  Temp:      TempSrc:      SpO2:   97% 92%  Weight:      Height:       Physical Exam  Constitutional: He is oriented to person, place, and time. He appears well-developed and well-nourished. No distress.  Cardiovascular: Regular rhythm and normal heart sounds. Exam reveals no gallop and no friction rub.  No murmur heard. Tachycardic  Pulmonary/Chest: Effort normal. No respiratory distress.  Abdominal: Soft. Bowel sounds are normal. He exhibits no distension. There is no tenderness.  Musculoskeletal: Normal range of motion. He exhibits no edema.  Neurological: He is alert and oriented to person, place, and time.    Assessment/Plan:  Principal Problem:   Alcohol withdrawal ([place]) Active Problems:   Acute pancreatitis   AKI (acute kidney injury) ([place])   Alcohol abuse   Hypokalemia   Cholelithiasis   Hyponatremia   Refeeding syndrome  # Alcohol withdrawal:Much improved this morning. Alert and fully  oriented. Received Librium TID yesterday and no PRN Ativan. Remains tachycardic with HR 80-120s.Otherwise, hemodynamically stable. Will continue management as below.  - Start Librium 25 mg TID  - Ativan PRN per CIWA protocol - Tolerating soft diet - Discussed importance of alcohol cessation  - Will consult SW for resources for alcohol cessation   # Hyponatremia: SIADH vs reset osmostat based on urine studies. IVF stopped yesterday with improvement in sodium 125-->128. Serum osm also improved 258-->262.  - Fluid restriction  - Repeating urine studies   # Refeeding syndrome:K 3.7, Mag 1.8, and Phos 3.1. Discussed with patient importance of alcohol cessation as this is the common factor between his current medical issues.  - Continue to monitor electrolytes and replete PRN  - Tolerating soft diet  #Acute pancreatitis:Alcoholic vs. Gallstone pancreatitis. Lipid panel normal.MRCP with mild peripancreatic edema, but no choledocholithiasis. Agree with surgery he is not a candidate for surgery at this time with active alcohol withdrawal. Surgery signed off. GI will continue to follow.  - Tolerating soft diet - Continue to monitor for symptoms. Plan to re-consult surgery if he develops symptoms  - GI following, appreciate recommendations  # Alcoholuse disorder:Patient reports daily drinking, 2-3 16 oz Margaritas, more in the weekend. On admission, he reported his last drink was 5-6 days prior to admission. No history of withdrawals. Yesterday he reported his last drink was on the morning of admission.  -Management as above  # COPD:no PFTs to review. Not on inhalers at home - Will  continue to monitor respiratory status   Dispo: Anticipated discharge in approximately 1-2 [place](s).   Welford Roche, MD 02/12/2018, 10:19 AM Pager: [phone]
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
## 2018-02-13 LAB — BASIC METABOLIC PANEL
Anion gap: 12 (ref 5–15)
BUN: <5 mg/dL — ABNORMAL LOW (ref 6–20)
CO2: 24 mmol/L (ref 22–32)
Calcium: 8.7 mg/dL — ABNORMAL LOW (ref 8.9–10.3)
Chloride: 92 mmol/L — ABNORMAL LOW (ref 101–111)
Creatinine, Ser: 0.77 mg/dL (ref 0.61–1.24)
GFR calc Af Amer: >60 mL/min (ref >60)
GFR calc non Af Amer: >60 mL/min (ref >60)
Glucose, Bld: 101 mg/dL — ABNORMAL HIGH (ref 65–99)
Potassium: 3.4 mmol/L — ABNORMAL LOW (ref 3.5–5.1)
Sodium: 128 mmol/L — ABNORMAL LOW (ref 135–145)

## 2018-02-13 LAB — PHOSPHORUS: Phosphorus: 3.2 mg/dL (ref 2.5–4.6)

## 2018-02-13 LAB — MAGNESIUM: Magnesium: 1.7 mg/dL (ref 1.7–2.4)

## 2018-02-13 MED ORDER — LORAZEPAM 2 MG/ML IJ SOLN: 1.0000 mg | Freq: Four times a day (QID) | INTRAMUSCULAR | Status: DC | PRN

## 2018-02-13 MED ORDER — CHLORDIAZEPOXIDE HCL 25 MG PO CAPS
25.0000 mg | ORAL_CAPSULE | Freq: Two times a day (BID) | ORAL | Status: DC
  Administered 2018-02-13 – 2018-02-15 (×4): 25 mg via ORAL
  Filled 2018-02-13 (×4): qty 1

## 2018-02-13 MED ORDER — LORAZEPAM 1 MG PO TABS: 1.0000 mg | ORAL_TABLET | Freq: Four times a day (QID) | ORAL | Status: DC | PRN

## 2018-02-13 MED ORDER — LORAZEPAM 1 MG PO TABS: 0.0000 mg | ORAL_TABLET | Freq: Two times a day (BID) | ORAL | Status: DC

## 2018-02-13 MED ORDER — LORAZEPAM 1 MG PO TABS: 0.0000 mg | ORAL_TABLET | Freq: Four times a day (QID) | ORAL | Status: AC

## 2018-02-13 MED ORDER — POTASSIUM CHLORIDE CRYS ER 20 MEQ PO TBCR
40.0000 meq | EXTENDED_RELEASE_TABLET | Freq: Once | ORAL | Status: AC
  Administered 2018-02-13: 40 meq via ORAL
  Filled 2018-02-13: qty 2

## 2018-02-13 NOTE — Progress Notes (Signed)
Subjective: Patient laying in bed comfortably in no distress. No acute events overnight. States Wesley [name] is feeling better. Denies any abdominal pain, N/V, or headaches. Tolerating soft diet well. Wesley [name] was oriented x3. Wesley [name] wants to go home today, saying his mother is on her way to pick him up.    Objective: Vital signs in last 24 hours: Vitals:   02/12/18 1644 02/13/18 0127 02/13/18 0227 02/13/18 0327  BP: (!) 135/108 96/67 111/81 102/81  Pulse:      Resp: _0 Temp:      TempSrc:      SpO2: 97%     Weight:      Height:       Weight change:  No intake or output data in the 24 hours ending 02/13/18 0718 Physical Exam  Constitutional: Wesley [name] is oriented to person, place, and time and well-developed, well-nourished, and in no distress.  HENT:  Head: Normocephalic and atraumatic.  Eyes: Conjunctivae are normal.  Neck: Normal range of motion. Neck supple.  Cardiovascular: Regular rhythm and normal heart sounds. Tachycardia present.  No murmur heard. Pulmonary/Chest: Effort normal and breath sounds normal. Wesley [name] has no wheezes.  Abdominal: Soft. Bowel sounds are normal. Wesley [name] exhibits no distension. There is no tenderness.  Musculoskeletal: Normal range of motion.  Neurological: Wesley [name] is alert and oriented to person, place, and time.  Skin: Skin is warm. No rash noted.   Lab Results: _1 @ Micro Results: No results found for this or any previous visit (from the past 240 hour(s)). Studies/Results: [name]  Result Date: 02/11/2018 CLINICAL DATA:  Acute alcoholic pancreatitis. Encephalopathy. Abnormal liver function tests. Cholelithiasis. EXAM: MRI ABDOMEN WITHOUT AND WITH CONTRAST (INCLUDING MRCP) TECHNIQUE: Multiplanar multisequence MR imaging of the abdomen was performed both before and after the administration of intravenous contrast. Heavily T2-weighted images of the biliary and pancreatic ducts were obtained, and three-dimensional MRCP images were rendered by post  processing. CONTRAST:  58m MULTIHANCE GADOBENATE DIMEGLUMINE 529 MG/ML IV SOLN COMPARISON:  CT scan 02/07/2018 and ultrasound also of 02/07/2018 FINDINGS: Despite efforts by the technologist and patient, motion artifact is present on today's exam and could not be eliminated. The patient has encephalopathy and was somnolent during imaging, as well as tachypneic. This reduces exam sensitivity and specificity. Lower chest: Small bilateral pleural effusions. Dependent airspace opacities in both lungs. Hepatobiliary: Small nonenhancing fluid signal intensity lesions in both lungs favor small cysts or biliary hamartomas. No worrisome focal enhancing liver lesion. No significant nodularity or cirrhotic morphologic findings in the liver. The common hepatic duct measures up to 5 mm in diameter. The common bile duct measures 4 mm in diameter. No observed filling defect or obvious stricture in either. No significant degree of intrahepatic biliary dilatation. Sludge is present in the gallbladder, along with multiple small dependent layering gallstones as shown on image 25/4. Trace perihepatic and pericholecystic fluid. No significant gallbladder wall thickening. Pancreas: There is only equivocal peripancreatic edema. No findings of pancreatic necrosis, pseudocyst, or abscess. Spleen:  Unremarkable Adrenals/Urinary Tract:  Unremarkable Stomach/Bowel: Mild wall thickening in the descending and transverse duodenum probably related to secondary inflammation. Vascular/Lymphatic: Aortoiliac atherosclerotic vascular disease. Patent portal vein and splenic vein. Small likely reactive right gastric and celiac chain lymph nodes. Other: Trace ascites adjacent to the liver and spleen. Mild bilateral perirenal stranding appears symmetric. Musculoskeletal: Degenerative disc disease and degenerative endplate findings at LA1-P3 IMPRESSION: 1. Mild peripancreatic edema likely with secondary inflammation of the adjacent descending and proximal  transverse duodenum, suggesting acute pancreatitis. No findings of pancreatic necrosis, pseudocyst, or abscess. 2. Cholelithiasis is present, without evidence of choledocholithiasis. No biliary dilatation. 3. Trace upper abdominal ascites and perirenal stranding. 4. Dependent airspace opacities in both lungs. Pneumonia is not excluded. Small bilateral pleural effusions. 5. There are a few scattered small cystic lesions in the liver, compatible with hepatic cysts or biliary hamartomas. Degenerative disc disease at L5-S1. Electronically Signed   By: Van Clines M.D.   On: 02/11/2018 14:38   Medications: I have reviewed the patient's current medications. Scheduled Meds: . chlordiazePOXIDE  25 mg Oral TID  . folic acid  1 mg Oral Daily  . nicotine  21 mg Transdermal Daily  . polyethylene glycol  17 g Oral Daily  . thiamine  100 mg Oral Daily   Continuous Infusions: PRN Meds:.LORazepam, ondansetron (ZOFRAN) IV Assessment/Plan: Principal Problem:   Alcohol withdrawal ([place]) Active Problems:   Acute pancreatitis   AKI (acute kidney injury) ([place])   Alcohol abuse   Hypokalemia   Cholelithiasis   Hyponatremia   Refeeding syndrome  Wesley [name] is a 52 yo male with a significant history of COPD, alcohol abuse and tobacco use who presented to the ED with abdominal pain, nausea and vomiting and found to have dehydration and elevated lipase consistent with acute pancreatitis. Wesley [name] was admitted to the inpatient unit and the specific problems addressed are:  Alcoholwithdrawal  Improved withdraw symptoms. No agitation or hallucination overnight. Still  Tachycardic on exam.Somnolent but responding to questions appropriately and oriented x3.Reported that Wesley [name] had alcohol the [place] of his admission. Only needed one dose of Librium in the last 24 hrs, no prn ativan needed. Still on CIWA stepdown protocol. Asking to leave hospital but does not seem to be able to leave AMA in current condition. Will  continue to assess readiness for discharge. -Discuss the importance of alcohol cessation -Continue Librium capsule 25 mg PO BID -PRN Ativan 2-3 mg IV -Trend Mag, Phos and K+ and replete as needed -Thiamine 100 mg PO daily           HypotonicHyponatremia Trending up.Na up to 128 today. Withpatient being in a euvolemic state, this couldalso be aresult of hypothyroidism orSIADH.TSH (1.106) normal so hypothyroidism is unlikely. Pt does not have any medical problems or medications that will cause SIADH. However, the SIADH could be 2/2 to reset osmostat. This can be confirmed by observing response to water load (10-15 mL/kg PO or IV), however, this could acutely worsen the hyponatremia. Refeeding syndrome 2/2 to the metabolic stress placed on the body by the pancreatitis and refeeding after mild starvation is also on the differential. Mag and phos now normal.Na improving on fluid restriction. Will continue to assess electrolytes and replete as needed. -Fluid restrict <1500 mL/[place] and monitor -F/u urine studies -Continue to monitor electrolytes and replete -Continue to assess mental status -Repeat BMP  Refeeding Syndrome Improving. K+3.4, 1.7, phosp Mag 3.2 . Will continue to repleteelectrolytesand restrict fluid intake. -IVNS _0  mL/hr -Trend K+Mg, phos and monitor for symptoms  Acute pancreatitis Resolved. Lipase is down to 171. Normal lipid panel.Improved hepatic function.MRI-MRCP consistent with acute gallstone pancreatitis, but no biliary dilation or choledocholithiasis. Lap cholecystectomy not urgent per surgery, plan to schedule surgery outpatient. GI signed off with plan to follow up in 2 months. -Surgery consult, appreciate recs -F/u MRCP -IVF resuscitation -Strict I/Os  Alcohol use disorde Extensive history of alcohol use(40 years)with family history of father that passed away from cirrhosis at the  age of 10. Drinks 2-3 16 oz cups of margarita and Tequila during  the week, 4 on the weekends.Reports no hx of withdraw orseizures. -SW consult for alcohol cessation resources  COPD No breathing problems. Not currently on any inhalers at home. No respiratory distress on exam.  -Continue outpatient management  Code Status: Full Code  Dispo:Anticipated discharge today or tomorrow  This is a Careers information officer Note.  The care of the patient was discussed with Dr. Alphonzo Grieve and the assessment and plan formulated with their assistance.  Please see their attached note for official documentation of the daily encounter.   LOS: 6 days   Lacinda Axon, Medical Student 02/13/2018, 7:18 AM

## 2018-02-13 NOTE — Progress Notes (Signed)
Subjective:  Patient first evaluated early this morning when he was found to be somnolent but answering yes/no questions appropriately.   We were notified by nursing that patient had perked up and was indicating wanting to leave. On re-eval, patient was sitting on side of bed, calm. He stated that he needed to take care of some financial matters which is why he wants to leave. He denied abd pain, n/v. Patient did endorse feeling weak as he has not been out of bed in setting of acute issues with his withdrawal. He agrees to eval by PT/OT.  Objective:  Vital signs in last 24 hours: Vitals:   02/12/18 1644 02/13/18 0127 02/13/18 0227 02/13/18 0327  BP: (!) 135/108 96/67 111/81 102/81  Pulse:      Resp: _0 Temp:      TempSrc:      SpO2: 97%     Weight:      Height:       Constitutional: NAD CV: tachycardic, no M/R/G Resp: no increased work of breathing, CTAB on anterior lung fields Abd: soft, NDNT  Assessment/Plan:  Principal Problem:   Alcohol withdrawal ([place]) Active Problems:   Acute pancreatitis   AKI (acute kidney injury) ([place])   Alcohol abuse   Hypokalemia   Cholelithiasis   Hyponatremia   Refeeding syndrome  Alcohol withdrawal: Patient with low CIWA scores throughout [place] yesterday. He has remained tachycardic but asymptomatic. Alert and oriented this morning on recheck, however at first was more somnolent. --Librium 66m BID while in hospital --CIWA - ativan protocol --SW consult for resources --advised EtOH cessation --PT/OT consulted as patient noted to be unsteady and has been in bed close to a week in setting of acute issues - appreciate their assistance  Hyponatremia: Likely chronic; stable today at 128. Studies from earlier this week point towards SIADH vs reset osmostat. Volume restricting.  Refeeding syndrome: Resolved. Electrolytes stable. --replete PRN  Acute pancreatitis: Resolved. EtOH vs gallstone. Patient to f/u outpatient with  surgery for elective cholecystectomy. Advised EtOH cessation.  Dispo: Anticipated discharge in approximately 1-2 [place](s).   SAlphonzo Grieve MD 02/13/2018, 12:57 PM Pager [phone]

## 2018-02-13 NOTE — Progress Notes (Signed)
Patient has been wanting to leave the hospital throughout the shift today. Says that he is "tired of being here" and has things he must take care of at home. Leone Brand, MD and Medical Student P. [place] spoke to patient twice in regards to his decision to leave and thoroughly explained why he is not medically ready to be discharged. Patient is alert and oriented and was able to verbalize risks of leaving against medical advice. Patient has called his mom to transport him home.
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
## 2018-02-13 NOTE — Progress Notes (Signed)
Patient's mom and cousin at bedside and refuse to take him back to his house until he is safe and officially discharged. Patient's mom states he does not have another way to get home at this time. MD notified that patient will likely be staying another night. Will continue to monitor.

## 2018-02-13 NOTE — Progress Notes (Signed)
Notified by nursing that patient was going to leave. I evaluated patient at bedside; he stated that he has some financial responsibilities that he needs to take care of and has to get home today, and that there is no-one to help him take care of this issue.   When addressing OT recommendations for SNF due to deconditioning from prolonged hospital stay, he states that he will go home and lay on couch and slowly work on his strength; he has a fiance at home who is unable to care for herself, and a 31 year old grandson. He declines SNF stay but is open to [facility] services.   During our discussion, patient had been consistently tachycardic in 140s; he denied chest pain, palpitations, dizziness.  We discussed risks of leaving at the current time in setting of his tachycardia and recommendations for SNF, including heart attacks, strokes, falls with injury, seizures; patient stated understanding and stated persistent wish to leave the hospital today. The plan was to discharge him with [facility] and to give resources to make f/u appt with [facility].  RN notified us that when patient's family came to bedside (shortly after our departure) they stated they were uncomfortable taking patient home in current state and did not want to provide him with transportation. At this time I made RN aware that we would continue to treat him under previous treatment plan. Patient was unable to find another way home so he will stay the night. Unfortunately his IV was taken out; we will convert IV meds to PO as he is now able to take PO w/o issues.  Alphonzo Grieve, MD IMTS - PGY2 Pager [phone]

## 2018-02-13 NOTE — Evaluation (Addendum)
Occupational Therapy Evaluation Patient Details Name: Wesley [name] MRN: [id number] DOB: March 21, 1966 Today's Date: 02/13/2018    History of Present Illness 52 yo male with etoh withdrawal on CIWA acute pancreatitis tachycardia and gallstones.    Clinical Impression   PT admitted with acute pancreatitis and gallstones. Pt currently with functional limitiations due to the deficits listed below (see OT problem list). Pt currently requires mod (A) for LB and demonstrates balance deficits with all adls. Pt with a full body tremor/ shake at this time which affects task such as brushing his teeth as demonstrated in this session. Pt also expressed that 77 yo grandson "takes care of [place]" and defined this "takes care of [place]" as laundry meal prep and clean up. Question the need to have social work Passenger transport manager further the responsibilities for such a young child for two adults. Pt later reports neighbors could help him.  Pt will benefit from skilled OT to increase their independence and safety with adls and balance to allow discharge SNF. Pt lacks awareness to deficits and the ability of a 53 year old to care for him.   Hr throughout session 130-152    Follow Up Recommendations  SNF    Equipment Recommendations  Other (comment)(rw)    Recommendations for Other Services Other (comment)(social work to address 28 yo grandson)     Precautions / Restrictions Precautions Precautions: Fall Precaution Comments: HR 130-152       Mobility Bed Mobility Overal bed mobility: Needs Assistance Bed Mobility: Sit to Sidelying         Sit to sidelying: Min assist General bed mobility comments: pt requires incr time and extensive effort  Transfers Overall transfer level: Needs assistance Equipment used: Rolling walker (2 wheeled) Transfers: Sit to/from Stand Sit to Stand: Mod assist         General transfer comment: pt requires (A) to power up and even pulling on RW is unable to power up alone     Balance Overall balance assessment: Needs assistance Sitting-balance support: Bilateral upper extremity supported;Feet supported Sitting balance-Leahy Scale: Fair     Standing balance support: Single extremity supported;During functional activity Standing balance-Leahy Scale: Poor                             ADL either performed or assessed with clinical judgement   ADL Overall ADL's : Needs assistance/impaired   Eating/Feeding Details (indicate cue type and reason): pt with breakfast and lunch present and declines all food. pt states "i am not hungery" pt is 124 pounds and declining all food Grooming: Oral care;Minimal assistance Grooming Details (indicate cue type and reason): heavy reliance on counter, requires seated position after standing ~5 minutes . pt reports yes when asked "does that feel like  a marathon?"  Upper Body Bathing: Min guard   Lower Body Bathing: Moderate assistance     Upper Body Dressing Details (indicate cue type and reason): min Lower Body Dressing: Moderate assistance Lower Body Dressing Details (indicate cue type and reason): pt requires (A) to push foot into shoe and tie them pt is able to reach them but limited by tremor/ shakes and decr strength Toilet Transfer: Moderate assistance Toilet Transfer Details (indicate cue type and reason): requires (A) to power up from chair with arm rest to simulate 3n1         Functional mobility during ADLs: Moderate assistance;Rolling walker General ADL Comments: pt requires cues throughout session for safety  adn fatigues quickly     Vision Baseline Vision/History: Wears glasses Wears Glasses: At all times       Perception     Praxis      Pertinent Vitals/Pain Pain Assessment: No/denies pain     Hand Dominance Right   Extremity/Trunk Assessment Upper Extremity Assessment Upper Extremity Assessment: Generalized weakness   Lower Extremity Assessment Lower Extremity Assessment:  Generalized weakness   Cervical / Trunk Assessment Cervical / Trunk Assessment: Kyphotic   Communication Communication Communication: No difficulties   Cognition Arousal/Alertness: Awake/alert Behavior During Therapy: Flat affect Overall Cognitive Status: Impaired/Different from baseline Area of Impairment: Awareness                           Awareness: Emergent   General Comments: pt lacks awareness to his deficits, lacks awareness to the 76 year olds responsibilities are not to provide his caregiver, pt lacks awareness to tachycardia and how this could be concerning   General Comments  risk for pressure sores due to decr mobility, poor PO intake and minimal body habitus with very bony body mass at this time    Exercises     Shoulder Instructions      Home Living Family/patient expects to be discharged to:: Skilled nursing facility Living Arrangements: Other (Comment)(fiance and 33 yo grandson) Available Help at Discharge: Family Type of Home: House Home Access: Stairs to enter Technical brewer of Steps: 1   Home Layout: One level     Bathroom Shower/Tub: Occupational psychologist: Standard     Home Equipment: None   Additional Comments: pt reports that 10 yr takes care of him and his fiance. Pt reports grandson sits on the porch to catch the school bus. he is required to help with meals and he is the one that cleans up the meals and sometimes his grandmother helps. 40 year old does the laundry. pt reports 'he is a big boy" when questioned about 53 year old       Prior Functioning/Environment Level of Independence: Independent        Comments: reports he needs to go home to address things. when asked if OT could get him phone numbers for companies regarding bills or help him located staff to address his "needs " that require leaving the hospital. He states "no i dont let anyone mess with my bank account and reports all bills are paid" pt  would never direct verbalize what was so pressing that he needs to leave . pt only states i just need a w/c to get to my moms car        OT Problem List: Decreased strength;Decreased activity tolerance;Impaired balance (sitting and/or standing);Decreased coordination;Decreased cognition;Decreased safety awareness;Decreased knowledge of use of DME or AE;Decreased knowledge of precautions;Cardiopulmonary status limiting activity;Impaired UE functional use;Increased edema      OT Treatment/Interventions: Self-care/ADL training;Therapeutic exercise;Energy conservation;DME and/or AE instruction;Therapeutic activities;Cognitive remediation/compensation;Patient/family education;Balance training    OT Goals(Current goals can be found in the care plan section) Acute Rehab OT Goals Patient Stated Goal: to go home today via mothers car OT Goal Formulation: With patient Time For Goal Achievement: 02/27/18 Potential to Achieve Goals: Good  OT Frequency: Min 2X/week   Barriers to D/C: Decreased caregiver support  relies on 10 yr grandson per patient report       Co-evaluation              AM-PAC PT "6 Clicks" Daily Activity  Outcome Measure Help from another person eating meals?: None Help from another person taking care of personal grooming?: A Little Help from another person toileting, which includes using toliet, bedpan, or urinal?: A Lot Help from another person bathing (including washing, rinsing, drying)?: A Lot Help from another person to put on and taking off regular upper body clothing?: A Lot Help from another person to put on and taking off regular lower body clothing?: A Lot 6 Click Score: 15   End of Session Equipment Utilized During Treatment: Gait belt;Rolling walker Nurse Communication: Mobility status;Precautions  Activity Tolerance: Patient limited by fatigue Patient left: in bed;with call bell/phone within reach;with bed alarm set  OT Visit Diagnosis: Unsteadiness  on feet (R26.81);Muscle weakness (generalized) (M62.81)                Time: 1660-6301 OT Time Calculation (min): 28 min Charges:  OT General Charges $OT Visit: 1 Visit OT Evaluation $OT Eval Moderate Complexity: 1 Mod G-Codes:      Jeri Modena   OTR/L Pager: [phone] Office: [phone] .   Parke Poisson B 02/13/2018, 2:45 PM

## 2018-02-14 LAB — BASIC METABOLIC PANEL
Anion gap: 12 (ref 5–15)
BUN: 5 mg/dL — ABNORMAL LOW (ref 6–20)
CO2: 23 mmol/L (ref 22–32)
Calcium: 9.1 mg/dL (ref 8.9–10.3)
Chloride: 95 mmol/L — ABNORMAL LOW (ref 101–111)
Creatinine, Ser: 0.75 mg/dL (ref 0.61–1.24)
GFR calc Af Amer: >60 mL/min (ref >60)
GFR calc non Af Amer: >60 mL/min (ref >60)
Glucose, Bld: 98 mg/dL (ref 65–99)
Potassium: 3.8 mmol/L (ref 3.5–5.1)
Sodium: 130 mmol/L — ABNORMAL LOW (ref 135–145)

## 2018-02-14 LAB — PHOSPHORUS: Phosphorus: 3.6 mg/dL (ref 2.5–4.6)

## 2018-02-14 LAB — MAGNESIUM: Magnesium: 1.7 mg/dL (ref 1.7–2.4)

## 2018-02-14 MED ORDER — MAGNESIUM SULFATE 2 GM/50ML IV SOLN
2.0000 g | Freq: Once | INTRAVENOUS | Status: AC
  Administered 2018-02-14: 2 g via INTRAVENOUS
  Filled 2018-02-14: qty 50

## 2018-02-14 MED ORDER — GUAIFENESIN-DM 100-10 MG/5ML PO SYRP
5.0000 mL | ORAL_SOLUTION | ORAL | Status: DC | PRN
  Administered 2018-02-14: 5 mL via ORAL
  Filled 2018-02-14: qty 5

## 2018-02-14 MED ORDER — LACTULOSE 10 GM/15ML PO SOLN
30.0000 g | Freq: Once | ORAL | Status: AC
  Administered 2018-02-14: 30 g via ORAL
  Filled 2018-02-14: qty 45

## 2018-02-14 NOTE — Progress Notes (Addendum)
Subjective:  Patient sleeping but wakens easily. He denies headache, dizziness, chest pain, palpitations, shortness of breath.  Objective:  Vital signs in last 24 hours: Vitals:   02/13/18 2000 02/13/18 2100 02/13/18 2336 02/14/18 0355  BP:  112/86 100/66 100/78  Pulse: (!) 119 (!) 117    Resp:  _0 Temp:   99.7 F (37.6 C) 99.2 F (37.3 C)  TempSrc:   Oral   SpO2:  95% 95% 97%  Weight:      Height:       Constitutional: NAD HEENT: no tongue fasciculations CV: tachycardic, no M/R/G Resp: no increased work of breathing, CTAB on anterior lung fields Abd: soft, NDNT Ext: no tremor  Assessment/Plan:  Principal Problem:   Alcohol withdrawal ([facility]) Active Problems:   Acute pancreatitis   AKI (acute kidney injury) ([place])   Alcohol abuse   Hypokalemia   Cholelithiasis   Hyponatremia   Refeeding syndrome  Alcohol withdrawal: Patient with low CIWA scores. He has remained tachycardic but asymptomatic. Alert and oriented this morning on recheck, however at first was more somnolent. --Librium 42m BID while in hospital --CIWA - ativan protocol --SW consult for resources --advised EtOH cessation --PT/OT consulted as patient noted to be unsteady and has been in bed close to a week in setting of acute issues - appreciate their assistance  Tachycardia: Patient persistently tachycardic, with low CIWA scores and on exam no other signs or symptoms of withdrawal. We are fluid restricting him in setting of likely SIADH and he continues to appear euvolemic on exam. --f/u EKG this AM  Hyponatremia: Likely chronic; stable today at 130. Studies from earlier this week point towards SIADH vs reset osmostat. Volume restricting with appropriate response so far.  Refeeding syndrome: Resolved. Electrolytes stable. --replete PRN  Acute pancreatitis: Resolved. EtOH vs gallstone. Patient to f/u outpatient with surgery for elective cholecystectomy. Advised EtOH cessation.  Dispo:  Anticipated discharge in approximately 1-2 [place](s).   SAlphonzo Grieve MD 02/14/2018, 10:25 AM Pager [phone]

## 2018-02-14 NOTE — Evaluation (Signed)
Physical Therapy Evaluation Patient Details Name: Wesley [name] MRN: [id number] DOB: 17-Feb-1966 Today's Date: 02/14/2018   History of Present Illness  52 yo male with etoh withdrawal on CIWA acute pancreatitis tachycardia and gallstones.   Clinical Impression  Pt admitted with above diagnosis. Pt currently with functional limitations due to the deficits listed below (see PT Problem List). Patient became quietly tearful for several minutes due to inability to walk (initially low BP and elevated HR 140). Very flat affect and appeared very disappointed. Agrees to participate with further PT.  Pt will benefit from skilled PT to increase their independence and safety with mobility to allow discharge to the venue listed below.       Follow Up Recommendations SNF(unsure pt will agree)    Equipment Recommendations  Rolling walker with 5" wheels    Recommendations for Other Services       Precautions / Restrictions Precautions Precautions: Fall Precaution Comments: HR 121-140 Restrictions Weight Bearing Restrictions: No      Mobility  Bed Mobility Overal bed mobility: Needs Assistance Bed Mobility: Sit to Sidelying;Sidelying to Sit   Sidelying to sit: Supervision(incr time)     Sit to sidelying: Min assist General bed mobility comments: pt requires incr time and extensive effort  Transfers Overall transfer level: Needs assistance Equipment used: Rolling walker (2 wheeled) Transfers: Sit to/from Stand Sit to Stand: Min assist         General transfer comment: pt requires less assist today to power up and even pulling on RW is unable to power up alone  Ambulation/Gait             General Gait Details: unable due to HR 140 with standing EOB  Stairs            Wheelchair Mobility    Modified Rankin (Stroke Patients Only)       Balance Overall balance assessment: Needs assistance Sitting-balance support: Bilateral upper extremity supported;Feet  supported Sitting balance-Leahy Scale: Fair     Standing balance support: During functional activity;Bilateral upper extremity supported Standing balance-Leahy Scale: Poor                               Pertinent Vitals/Pain Pain Assessment: No/denies pain    Home Living Family/patient expects to be discharged to:: Skilled nursing facility Living Arrangements: Other (Comment)(fiance and 43 yo grandson) Available Help at Discharge: Family Type of Home: House Home Access: Stairs to enter   Technical brewer of Steps: 1 Home Layout: One level Home Equipment: None Additional Comments: (per OT eval) pt reports that 10 yr takes care of him and his fiance. Pt reports grandson sits on the porch to catch the school bus. he is required to help with meals and he is the one that cleans up the meals and sometimes his grandmother helps. 107 year old does the laundry. pt reports 'he is a big boy" when questioned about 53 year old     Prior Function Level of Independence: Independent               Hand Dominance   Dominant Hand: Right    Extremity/Trunk Assessment   Upper Extremity Assessment Upper Extremity Assessment: Generalized weakness    Lower Extremity Assessment Lower Extremity Assessment: Generalized weakness    Cervical / Trunk Assessment Cervical / Trunk Assessment: Kyphotic  Communication   Communication: No difficulties  Cognition Arousal/Alertness: Awake/alert Behavior During Therapy: Flat affect Overall Cognitive  Status: No family/caregiver present to determine baseline cognitive functioning                                 General Comments: pt became tearful when told his HR was too high and BP too low to do therapy      General Comments      Exercises General Exercises - Upper Extremity Shoulder Flexion: AROM;Both;5 reps;Seated Digit Composite Flexion: AROM;Both;15 reps;Supine(hand pumps prior to sit EOB for BP  support) Composite Extension: AROM;Both;15 reps;Supine General Exercises - Lower Extremity Ankle Circles/Pumps: Limitations Ankle Circles/Pumps Limitations: pt unable to complete and reports due to neuropathy (bil great toe barely lifts) Long Arc Quad: AROM;Both;5 reps;Seated Heel Slides: AROM;Both;5 reps;Supine   Assessment/Plan    PT Assessment Patient needs continued PT services  PT Problem List Decreased strength;Decreased activity tolerance;Decreased balance;Decreased mobility;Decreased knowledge of use of DME;Cardiopulmonary status limiting activity;Impaired sensation       PT Treatment Interventions DME instruction;Gait training;Functional mobility training;Therapeutic activities;Therapeutic exercise;Balance training;Neuromuscular re-education;Cognitive remediation;Patient/family education    PT Goals (Current goals can be found in the Care Plan section)  Acute Rehab PT Goals Patient Stated Goal: to get strong enough to walk PT Goal Formulation: With patient Time For Goal Achievement: 02/28/18 Potential to Achieve Goals: Good    Frequency Min 3X/week(pt has not yet agreed to SNF)   Barriers to discharge        Co-evaluation               AM-PAC PT "6 Clicks" Daily Activity  Outcome Measure Difficulty turning over in bed (including adjusting bedclothes, sheets and blankets)?: A Little Difficulty moving from lying on back to sitting on the side of the bed? : A Little Difficulty sitting down on and standing up from a chair with arms (e.g., wheelchair, bedside commode, etc,.)?: A Lot Help needed moving to and from a bed to chair (including a wheelchair)?: A Lot Help needed walking in hospital room?: Total Help needed climbing 3-5 steps with a railing? : Total 6 Click Score: 12    End of Session   Activity Tolerance: Treatment limited secondary to medical complications (Comment)(low BP and elevated HR) Patient left: in bed;with call bell/phone within reach;with  bed alarm set Nurse Communication: Mobility status;Other (comment)(incr HR and low BP) PT Visit Diagnosis: Muscle weakness (generalized) (M62.81);Difficulty in walking, not elsewhere classified (R26.2)    Time: 9518-8416 PT Time Calculation (min) (ACUTE ONLY): 42 min   Charges:   PT Evaluation $PT Eval Low Complexity: 1 Low PT Treatments $Therapeutic Exercise: 8-22 mins   PT G Codes:          [employer], PT 02/14/2018, 11:27 AM

## 2018-02-14 NOTE — Progress Notes (Signed)
Occupational Therapy Treatment Patient Details Name: Wesley [name] MRN: [id number] DOB: Feb 18, 1966 Today's Date: 02/14/2018    History of present illness 52 y.o. male with ETOH withdrawal on CIWA acute pancreatitis tachycardia and gallstones.    OT comments  Treatment limited this session as pt with drop in BP and elevated HR as detailed below. He was able; however to ambulate to sink with mod assist to wash his hands. Pt reluctant to have assistance from therapist this session and with very flat affect. He reports concern over ability to return to daily activities including work tasks. Additionally noted that with all movement pt has a difficult time motor planning. Continue to recommend SNF level rehabilitation post-acute D/C.    Follow Up Recommendations  SNF    Equipment Recommendations  Other (comment)(RW)    Recommendations for Other Services Other (comment)(social work to address 64 y.o. grandson)    Precautions / Restrictions Precautions Precautions: Fall Precaution Comments: Watch HR Restrictions Weight Bearing Restrictions: No       Mobility Bed Mobility Overal bed mobility: Needs Assistance Bed Mobility: Supine to Sit;Sit to Supine   Sidelying to sit: Supervision Supine to sit: Supervision Sit to supine: Min assist   General bed mobility comments: Assist to coordinate efforts  Transfers Overall transfer level: Needs assistance Equipment used: None Transfers: Sit to/from Stand Sit to Stand: Min assist         General transfer comment: Able to power up with min assist to standing with handheld assistance.     Balance Overall balance assessment: Needs assistance Sitting-balance support: Bilateral upper extremity supported;Feet supported Sitting balance-Leahy Scale: Fair     Standing balance support: During functional activity;Bilateral upper extremity supported Standing balance-Leahy Scale: Poor Standing balance comment: Relies on UE support. Did not  want therapist to assist him during ambulation to sink.                            ADL either performed or assessed with clinical judgement   ADL Overall ADL's : Needs assistance/impaired   Eating/Feeding Details (indicate cue type and reason): set-up pt to eat lunch Grooming: Wash/dry hands;Minimal assistance Grooming Details (indicate cue type and reason): unable to tolerate longer than 5 minutes of activity Upper Body Bathing: Min guard           Lower Body Dressing: Moderate assistance   Toilet Transfer: Moderate assistance;Ambulation Toilet Transfer Details (indicate cue type and reason): Assistance to power up from chair. Maintains grasp on furniture for steadiness during ambulation.          Functional mobility during ADLs: Moderate assistance General ADL Comments: Pt requiring mod assist for safety and balance on feet today.      Vision       Perception     Praxis      Cognition Arousal/Alertness: Awake/alert Behavior During Therapy: Flat affect Overall Cognitive Status: No family/caregiver present to determine baseline cognitive functioning Area of Impairment: Awareness                           Awareness: Emergent   General Comments: Pt very quiet and concerned over his current predicament.         Exercises     Shoulder Instructions       General Comments In seated position, BP 103/82; when standing dropped to 94/74; on return to seated, 100/75. HR at rest 100-110 and up  to 136 bpm with ambulation to sink.     Pertinent Vitals/ Pain       Pain Assessment: No/denies pain  Home Living                                          Prior Functioning/Environment              Frequency  Min 2X/week        Progress Toward Goals  OT Goals(current goals can now be found in the care plan section)     Acute Rehab OT Goals Patient Stated Goal: to get strong enough to walk OT Goal Formulation: With  patient Time For Goal Achievement: 02/27/18 Potential to Achieve Goals: Good  Plan Discharge plan remains appropriate    Co-evaluation                 AM-PAC PT "6 Clicks" Daily Activity     Outcome Measure   Help from another person eating meals?: A Little Help from another person taking care of personal grooming?: A Little Help from another person toileting, which includes using toliet, bedpan, or urinal?: A Lot Help from another person bathing (including washing, rinsing, drying)?: A Lot Help from another person to put on and taking off regular upper body clothing?: A Lot Help from another person to put on and taking off regular lower body clothing?: A Lot 6 Click Score: 14    End of Session Equipment Utilized During Treatment: Gait belt;Rolling walker  OT Visit Diagnosis: Unsteadiness on feet (R26.81);Muscle weakness (generalized) (M62.81)   Activity Tolerance Treatment limited secondary to medical complications (Comment)(Low BP; High HR)   Patient Left in bed;with call bell/phone within reach;with bed alarm set   Nurse Communication          Time: 6295-2841 OT Time Calculation (min): 27 min  Charges: OT General Charges $OT Visit: 1 Visit OT Treatments $Self Care/Home Management : 23-37 mins  Norman Herrlich, MS OTR/L  Pager: [place] 02/14/2018, 4:51 PM

## 2018-02-14 NOTE — Plan of Care (Signed)
  Progressing Clinical Measurements: Diagnostic test results will improve 02/14/2018 0147 - Progressing by Verne Grain, RN Cardiovascular complication will be avoided 02/14/2018 0147 - Progressing by Verne Grain, RN Note Patient tachy hr 110s-120, while resting.  Patient denies chest pain, has not been out of bed to complain of dizziness  Will continue to monitor. Coping: Level of anxiety will decrease 02/14/2018 0147 - Progressing by Verne Grain, RN Note No signs of anxiety or agitation at this time.

## 2018-02-15 LAB — BASIC METABOLIC PANEL
Anion gap: 11 (ref 5–15)
BUN: <5 mg/dL — ABNORMAL LOW (ref 6–20)
CO2: 23 mmol/L (ref 22–32)
Calcium: 9.1 mg/dL (ref 8.9–10.3)
Chloride: 99 mmol/L — ABNORMAL LOW (ref 101–111)
Creatinine, Ser: 0.75 mg/dL (ref 0.61–1.24)
GFR calc Af Amer: >60 mL/min (ref >60)
GFR calc non Af Amer: >60 mL/min (ref >60)
Glucose, Bld: 113 mg/dL — ABNORMAL HIGH (ref 65–99)
Potassium: 3.6 mmol/L (ref 3.5–5.1)
Sodium: 133 mmol/L — ABNORMAL LOW (ref 135–145)

## 2018-02-15 LAB — PHOSPHORUS: Phosphorus: 3.8 mg/dL (ref 2.5–4.6)

## 2018-02-15 LAB — MAGNESIUM: Magnesium: 1.9 mg/dL (ref 1.7–2.4)

## 2018-02-15 NOTE — Progress Notes (Signed)
Subjective:  No acute events overnight. Patient remains tachycardic but asymptomatic. Denies chest pain and palpitations. Somnolent this morning but arousable to voice. Awake and alert when seen during rounds. He complains of difficulty eating and states he cannot find his mouth. He is tearful at times. Discussed with patient we continue to work with social work for disposition as he is uninsured and requires SNF for further rehabilitation. Also continue to encourage alcohol cessation. Patient verbalized understanding and is in agreement with plan. All questions answered.   Objective:  Vital signs in last 24 hours: Vitals:   02/14/18 1956 02/14/18 2355 02/15/18 0356 02/15/18 0455  BP: 113/81 102/83 110/89   Pulse:      Resp: 17 15 (!) 28   Temp: 98.1 F (36.7 C) 97.8 F (36.6 C) 98.1 F (36.7 C) 98.2 F (36.8 C)  TempSrc: Oral Oral Oral Oral  SpO2: (P) 96% 98% 96%   Weight:      Height:       Physical Exam  Constitutional: He is oriented to person, place, and time. He appears well-developed and well-nourished. No distress.  Sleeping in bed in no acute distress. Arousable to voice   Cardiovascular: Regular rhythm. Tachycardia present. Exam reveals no gallop and no friction rub.  No murmur heard. Pulmonary/Chest: Effort normal and breath sounds normal. No respiratory distress. He has no wheezes. He has no rales.  Abdominal: Soft. Bowel sounds are normal. He exhibits no distension. There is no tenderness.  Musculoskeletal: He exhibits no edema.  Neurological: He is alert and oriented to person, place, and time.     Assessment/Plan:  Principal Problem:   Alcohol withdrawal ([place]) Active Problems:   Acute pancreatitis   Alcohol abuse   Hypokalemia   Cholelithiasis   Hyponatremia   Refeeding syndrome  # Alcohol withdrawal: Patient with CIWA score 0 and on Librium BID. Tachycardia improving, remains asymptomatic. Initially somnolent when seen this morning, but alert and  oriented when assessed during rounds. Will given one dose of Librium today and discontinue it. PT/OT recommending SNF due to deconditioning and unsteadiness during ambulation, but patient is uninsured. Appreciate SW assistance with disposition.  - Librium 60m x1 today then stop  - CIWA - ativan protocol - SW consult for resources and help with SNF placement  - Continue to educate patient about EtOH cessation - PT/OT following   # Tachycardia: Patient persistently tachycardic, with low CIWA scores and on exam no other signs or symptoms of withdrawal. We are fluid restricting him in setting of likely SIADH and he continues to appear euvolemic on exam. EKG showed sinus tachycardia, no other abnormalities. Improving, HR 100s today.  - Continue to monitor   # Hyponatremia: Likely chronic; continues to improve, 133 today. Studies from earlier this week point towards SIADH vs reset osmostat.  - Continue fluid restriction   # Refeeding syndrome: Resolved. Electrolytes stable. - Continue to monitor and replete PRN  # Acute pancreatitis: Resolved. EtOH vs gallstone. Patient to f/u outpatient with surgery for elective cholecystectomy. Advised EtOH cessation.   Dispo: Anticipated discharge in approximately 1-2 [place](s) pending SNF arrangement as patient is uninsured.   SWelford Roche MD 02/15/2018, 8:07 AM Pager: [phone]

## 2018-02-15 NOTE — Plan of Care (Signed)
  Progressing Health Behavior/Discharge Planning: Ability to manage health-related needs will improve 02/15/2018 0453 - Progressing by Verne Grain, RN Clinical Measurements: Will remain free from infection 02/15/2018 0453 - Progressing by Verne Grain, RN Cardiovascular complication will be avoided 02/15/2018 0453 - Progressing by Verne Grain, RN Coping: Level of anxiety will decrease 02/15/2018 0453 - Progressing by Verne Grain, RN

## 2018-02-15 NOTE — Care Management Note (Addendum)
Case Management Note  Patient Details  Name: Wesley [name] MRN: [id number] Date of Birth: November 22, 1966  Subjective/Objective:         ETOH withdrawal on CIWA, acute pancreatitis, tachycardia and gallstones. From home with fiance who's disabled and 53 yr old grandson. PTA independent with ADL's , no DME usage.          Derek Jack [phone]      PCP: Marya Landry  02/15/2018 1609  [facility] approved pt for home health  charity services (RN,PT,SW).     Action/Plan: NCM @ bedside to discuss discharge plan with pt and mom. NCM Shared  PT/OT eval./recommend: SNF, however, pt declined SNF Placement. Mom states  would like for son to d/c home with her when medically ready. Pt unsure if he will d/c to mom's home or his home once d/c. Pt without insurance, NCM made charity case referral with [facility] for home health services.  Expected Discharge Date:                  Expected Discharge Plan:  [place]  In-House Referral:     Discharge planning Services  CM Consult, [facility]  Post Acute Care Choice:    Choice offered to:     DME Arranged:   3 in1, rolling walker DME Agency:    [place]:   RN,PT,SW [place] Agency:   [facility]  Status of Service: COMPLETED  If discussed at [place] of Stay Meetings, dates discussed:    Additional Comments:  Sharin Mons, RN 02/15/2018, 1:08 PM

## 2018-02-15 NOTE — Progress Notes (Signed)
Physical Therapy Treatment Patient Details Name: Wesley [name] MRN: [id number] DOB: 06-16-1966 Today's Date: 02/15/2018    History of Present Illness 52 y.o. male with ETOH withdrawal on CIWA acute pancreatitis tachycardia and gallstones.     PT Comments    Pt continues to be generally weak and deconditioned, requiring a RW for safe gait for a short walk down the hallway.  I encouraged him to sit EOB for meals and to preform seated exercises.  He used to play football and run track and knows quite a few good exercises he can do, he just needs encouragement to do them.  HR better today 111-134 bpm during gait.  PT will continue to follow acutely.    Follow Up Recommendations  SNF;Other (comment)(not sure pt will agree)     Equipment Recommendations  Rolling walker with 5" wheels    Recommendations for Other Services   NA     Precautions / Restrictions Precautions Precautions: Fall Precaution Comments: Watch HR    Mobility  Bed Mobility Overal bed mobility: Needs Assistance Bed Mobility: Sidelying to Sit   Sidelying to sit: Supervision       General bed mobility comments: supervision for safety, extra time to complete transitions and heavy use of rails.   Transfers Overall transfer level: Needs assistance Equipment used: None Transfers: Sit to/from Stand Sit to Stand: Min assist         General transfer comment: Min assist to help power up to stand.  Verbal cues for safe hand placement.    Ambulation/Gait Ambulation/Gait assistance: Min assist Ambulation Distance (Feet): 35 Feet Assistive device: Rolling walker (2 wheeled) Gait Pattern/deviations: Step-through pattern;Shuffle;Trunk flexed Gait velocity: decreased Gait velocity interpretation: <1.8 ft/sec, indicative of risk for recurrent falls General Gait Details: Pt with shuffling choppy  steps. HR 111-134 during gait. Attempted to walk a short distance without RW and pt unable to safely, so switched to RW and  he did better.           Balance Overall balance assessment: Needs assistance Sitting-balance support: No upper extremity supported;Feet supported Sitting balance-Leahy Scale: Good     Standing balance support: Single extremity supported;Bilateral upper extremity supported Standing balance-Leahy Scale: Poor Standing balance comment: needs assist and UE support in standing.                             Cognition Arousal/Alertness: Awake/alert Behavior During Therapy: WFL for tasks assessed/performed Overall Cognitive Status: No family/caregiver present to determine baseline cognitive functioning                                        Exercises General Exercises - Upper Extremity Shoulder Flexion: AROM;Both;10 reps General Exercises - Lower Extremity Long Arc Quad: AROM;Both;10 reps Hip ABduction/ADduction: AROM;Both;10 reps Hip Flexion/Marching: AROM;Both;10 reps Toe Raises: AROM;Both;10 reps Heel Raises: AROM;Both;10 reps Mini-Sqauts: AROM;Both;10 reps    General Comments General comments (skin integrity, edema, etc.): BPs remain soft, but pt is asymptomatic.       Pertinent Vitals/Pain Pain Assessment: No/denies pain           PT Goals (current goals can now be found in the care plan section) Acute Rehab PT Goals Patient Stated Goal: to go home Progress towards PT goals: Progressing toward goals    Frequency    Min 3X/week(has not yet agreed to SNF)  PT Plan Current plan remains appropriate       AM-PAC PT "6 Clicks" Daily Activity  Outcome Measure  Difficulty turning over in bed (including adjusting bedclothes, sheets and blankets)?: A Little Difficulty moving from lying on back to sitting on the side of the bed? : A Little Difficulty sitting down on and standing up from a chair with arms (e.g., wheelchair, bedside commode, etc,.)?: Unable Help needed moving to and from a bed to chair (including a wheelchair)?: A  Little Help needed walking in hospital room?: A Little Help needed climbing 3-5 steps with a railing? : Total 6 Click Score: 14    End of Session   Activity Tolerance: Patient limited by fatigue Patient left: in bed;Other (comment);with bed alarm set(seated EOB)   PT Visit Diagnosis: Muscle weakness (generalized) (M62.81);Difficulty in walking, not elsewhere classified (R26.2)     Time: 2725-3664 PT Time Calculation (min) (ACUTE ONLY): 49 min  Charges:  $Gait Training: 8-22 mins $Therapeutic Exercise: 8-22 mins $Therapeutic Activity: 8-22 mins       Ulysses Alper B. Betti Goodenow, PT, DPT [phone]          02/15/2018, 4:00 PM

## 2018-02-15 NOTE — Progress Notes (Signed)
Subjective: Patient was laying comfortably in bed in no acute distress. He reports gas pains last night that resolved with medication. He denies any abd pain or nausea. He remain tachycardic but improved HR today compared to yesterday. He reported that PT tried to walk him yesterday, but stopped after his HR increased to the 150s. He expresses concerns about not being able to find his mouth when he is eating. We discussed with patient about SNF but due to his uninsured status, we plan to consult SW to help [place] with his options of outpatient or home rehab. We also reemphasized the importance of alcohol cessation in his recovery.   Objective: Vital signs in last 24 hours: Vitals:   02/15/18 0356 02/15/18 0455 02/15/18 0755 02/15/18 0805  BP: 110/89  108/81   Pulse:   (!) 108   Resp: (!) 28  16   Temp: 98.1 F (36.7 C) 98.2 F (36.8 C)  98.3 F (36.8 C)  TempSrc: Oral Oral  Oral  SpO2: 96%  98%   Weight:      Height:       Weight change:   Intake/Output Summary (Last 24 hours) at 02/15/2018 1101 Last data filed at 02/15/2018 1008 Gross per 24 hour  Intake 120 ml  Output -  Net 120 ml   Physical Exam  Constitutional: He is oriented to person, place, and time and well-developed, well-nourished, and in no distress.  HENT:  Head: Normocephalic and atraumatic.  Eyes: Pupils are equal, round, and reactive to light.  Neck: Normal range of motion. Neck supple.  Cardiovascular: Regular rhythm and normal heart sounds. Tachycardia present.  No murmur heard. Pulmonary/Chest: Effort normal. He has no wheezes.  Abdominal: Soft. Bowel sounds are normal. There is no tenderness.  Musculoskeletal: Normal range of motion.  Neurological: He is alert and oriented to person, place, and time.  Skin: Skin is warm. No rash noted.  Psychiatric: Affect normal.  Somnolent, but arousable   Lab Results: _0 @ Micro Results: No results found for this or any previous visit (from the past 240  hour(s)). Studies/Results: No results found. Medications: I have reviewed the patient's current medications. Scheduled Meds: . folic acid  1 mg Oral Daily  . LORazepam  0-4 mg Oral Q6H   Followed by  . LORazepam  0-4 mg Oral Q12H  . nicotine  21 mg Transdermal Daily  . polyethylene glycol  17 g Oral Daily  . thiamine  100 mg Oral Daily   Continuous Infusions: PRN Meds:.guaiFENesin-dextromethorphan, LORazepam, ondansetron (ZOFRAN) IV Assessment/Plan: Principal Problem:   Alcohol withdrawal ([place]) Active Problems:   Acute pancreatitis   Alcohol abuse   Hypokalemia   Cholelithiasis   Hyponatremia   Refeeding syndrome   [name] is a 52 yo male with a significant history of COPD, alcohol abuse and tobacco use who presented to the ED with abdominal pain, nausea and vomiting and found to have dehydration and elevated lipase consistent with acute pancreatitis. He was admitted to the inpatient unit and the specific problems addressed are:  Alcoholwithdrawal Has made significant improvements. Oriented x3, somnolent but response to questions. No agitation or hallucination overnight.[place]ians tachycardic on exam.Recent CIWA score of 0. On Librium BID, not needing Ativan.Plan to stop Librium since patient is doing well. Still on CIWA stepdown protocol.SNF unlikely to accept patient since he is uninsured. Will consult SW to assess his options for rehab and substance use treatment -Discuss the importance of alcohol cessation -ContinueLibrium 25 MG x1 today and  stop -CIWA with ativan stepdown protocol -Continue PT/OT services -SW consult, appreciate their assistance -Recommendation for AA on discharge  Tachycardia HR consistently over 100 since admission. Better today but still 108. Unable to continue PT services yesterday due to HR increasing to the 150s. Euvolemic with no signs of withdraw. Unknown baseline HR.  -Continue to monitor vitals  HypotonicHyponatremia Improving.  Na trending up, at 133 today. Other electrolytes are stable. Possibly 2/2 to SIADH or reset osmostat.  Will continue to assess electrolytes and replete as needed. -Continue fluid restrict <1500 mL/[place] and monitor -Follow Na levels  Refeeding Syndrome Resolved with stable electrolytes. K+3.6, Mg 1.9, phosp 3.8 . On fluid restriction. Will monitor electrolytes and replete as necessary.  -Fluid restriction as above -Trend K+Mg, phos andmonitor for symptoms  Acute pancreatitis Resolved. Normal lipase and lipid panel.Still gallstone vs. Alcoholic etiology. Will follow up with surgery outpatient for elective cholecystectomy. -Recommend ETOH cesssation -Strict I/Os  Alcohol use disorde Extensive history of alcohol use(40 years)with family history of father that passed away from cirrhosis at the age of 71. Drinks 2-3 16 oz cups of margarita and Tequila during the week, 4 on the weekends.Reports no hx of withdraw orseizures. -SW consult for alcohol cessation resources  COPD No breathing problems.Not currently on any inhalers at home.No respiratory distress onexam.  -Continue outpatient management  Code Status: Full Code  Dispo: Anticipated discharge less than 2 days pending SNF arrangements with SW.   This is a Careers information officer Note.  The care of the patient was discussed with Dr. Isac Sarna and the assessment and plan formulated with their assistance.  Please see their attached note for official documentation of the daily encounter.   LOS: 8 days   Lacinda Axon, Medical Student 02/15/2018, 11:01 AM

## 2018-02-16 ENCOUNTER — Encounter (HOSPITAL_COMMUNITY): Payer: Self-pay | Admitting: General Practice

## 2018-02-16 ENCOUNTER — Other Ambulatory Visit: Payer: Self-pay

## 2018-02-16 DIAGNOSIS — E871 Hypo-osmolality and hyponatremia: Secondary | ICD-10-CM

## 2018-02-16 DIAGNOSIS — K851 Biliary acute pancreatitis without necrosis or infection: Secondary | ICD-10-CM

## 2018-02-16 DIAGNOSIS — R000 Tachycardia, unspecified: Secondary | ICD-10-CM

## 2018-02-16 DIAGNOSIS — F10239 Alcohol dependence with withdrawal, unspecified: Secondary | ICD-10-CM

## 2018-02-16 DIAGNOSIS — E878 Other disorders of electrolyte and fluid balance, not elsewhere classified: Secondary | ICD-10-CM

## 2018-02-16 DIAGNOSIS — K8020 Calculus of gallbladder without cholecystitis without obstruction: Secondary | ICD-10-CM

## 2018-02-16 LAB — PHOSPHORUS: Phosphorus: 3.7 mg/dL (ref 2.5–4.6)

## 2018-02-16 LAB — BASIC METABOLIC PANEL
Anion gap: 13 (ref 5–15)
BUN: 5 mg/dL — ABNORMAL LOW (ref 6–20)
CO2: 26 mmol/L (ref 22–32)
Calcium: 9.4 mg/dL (ref 8.9–10.3)
Chloride: 95 mmol/L — ABNORMAL LOW (ref 101–111)
Creatinine, Ser: 0.86 mg/dL (ref 0.61–1.24)
GFR calc Af Amer: >60 mL/min (ref >60)
GFR calc non Af Amer: >60 mL/min (ref >60)
Glucose, Bld: 104 mg/dL — ABNORMAL HIGH (ref 65–99)
Potassium: 3.5 mmol/L (ref 3.5–5.1)
Sodium: 134 mmol/L — ABNORMAL LOW (ref 135–145)

## 2018-02-16 LAB — MAGNESIUM: Magnesium: 1.9 mg/dL (ref 1.7–2.4)

## 2018-02-16 MED ORDER — NICOTINE 21 MG/24HR TD PT24: 21.0000 mg | MEDICATED_PATCH | Freq: Every day | TRANSDERMAL | 0 refills | Status: DC

## 2018-02-16 MED ORDER — ENSURE ENLIVE PO LIQD
237.0000 mL | Freq: Two times a day (BID) | ORAL | Status: DC
  Administered 2018-02-16: 237 mL via ORAL

## 2018-02-16 NOTE — Progress Notes (Signed)
Occupational Therapy Treatment Patient Details Name: Wesley [name] MRN: [id number] DOB: 1966/01/04 Today's Date: 02/16/2018    History of present illness 52 y.o. male with ETOH withdrawal on CIWA acute pancreatitis tachycardia and gallstones.    OT comments  Pt requires extensive time for task that for his age should take much less time to complete. Pt insisting already that brother in law present take him by his house prior to his mothers house. Pt's mother calling during session to brother in law in room. Pt very eager to return home and reports "I am going to buy a one way ticket to [place] so I dont have to hear nothing from no body." pt remains with balance deficits and very high fall risk. BP stable 120/72 after sink level activity.   Follow Up Recommendations  SNF    Equipment Recommendations  Other (comment)    Recommendations for Other Services Other (comment)    Precautions / Restrictions Precautions Precautions: Fall Precaution Comments: watch BP HR       Mobility Bed Mobility               General bed mobility comments: sitting EOB on arrival  Transfers Overall transfer level: Needs assistance Equipment used: Rolling walker (2 wheeled) Transfers: Sit to/from Stand Sit to Stand: Min guard         General transfer comment: pt unsteady and Ot remaining close due to fall risk. pt LOB at EOB once and returning to seating position with attempt to transfer    Balance Overall balance assessment: Needs assistance Sitting-balance support: No upper extremity supported;Feet supported Sitting balance-Leahy Scale: Good     Standing balance support: Single extremity supported;During functional activity Standing balance-Leahy Scale: Poor Standing balance comment: heavily reliance on UB for support and belly leaning on sink for support to release with bil UE sue                           ADL either performed or assessed with clinical judgement   ADL  Overall ADL's : Needs assistance/impaired Eating/Feeding: Set up   Grooming: Oral care;Standing Grooming Details (indicate cue type and reason): pt heavily leaning on corner surface and requires 1 UE support at all times. Pt very unsteady and anxious when letting go of counter surface. pt with paint on arms. pt cued to wash off paint and needs mod (A) to detail. pt weting surface which is starting to come off skin but doesnt not finish the task. Ot has to complete task for patient         Upper Body Dressing : Supervision/safety;Sitting Upper Body Dressing Details (indicate cue type and reason): unable to complets standing due to balance Lower Body Dressing: Minimal assistance;Sit to/from stand Lower Body Dressing Details (indicate cue type and reason): pt required extended time and max effort to push bil Le into pant legs. pt encouraged to don personal socks and then c/o of hospital socks not fitting well into shoes. pt states "i am too lazy to do that" pt requires max (A) to tie shoes. pt required > 10 minutes to don pants and shoes.  Toilet Transfer: Designer, television/film set Details (indicate cue type and reason): simulated bed to chair surface. pt with heavy use of bil UE and requires RW         Functional mobility during ADLs: Min guard;Rolling walker General ADL Comments: pt requires maximum about of time to complete basic adl  task. pt needed to don pants, shoes and oral care at sink level and the activity required nearly 30 minutes due to fatigue and duration to attempt to complete task. pt demonstrates some unsteadiness in functional reach and fine motor task at this time but improved from previous session. BP stable during session     Vision   Additional Comments: wears glasses but reports he does not see well from glasses present because they are his old prescription   Perception     Praxis      Cognition Arousal/Alertness: Awake/alert Behavior During Therapy: WFL for  tasks assessed/performed Overall Cognitive Status: Impaired/Different from baseline Area of Impairment: Awareness                           Awareness: Emergent   General Comments: pt fixated on returning to his home. he expressed 5 different reasons to brother in law in the room that he needed to go home. Pt also states "i know what momma not answering the phone means. she is at my house"  Pt needs : check, new pair of glass, green glasses case, pair pants with belt and go by the house tocheck because no one else sees his things. "Everyone just looks and dont see a damn thing"        Exercises     Shoulder Instructions       General Comments      Pertinent Vitals/ Pain       Pain Assessment: No/denies pain  Home Living                                          Prior Functioning/Environment              Frequency  Min 2X/week        Progress Toward Goals  OT Goals(current goals can now be found in the care plan section)  Progress towards OT goals: Progressing toward goals  Acute Rehab OT Goals Patient Stated Goal: to go home OT Goal Formulation: With patient Time For Goal Achievement: 02/27/18 Potential to Achieve Goals: Good ADL Goals Pt Will Perform Lower Body Dressing: with supervision;sit to/from stand Pt Will Transfer to Toilet: with supervision;bedside commode;ambulating Additional ADL Goal #1: Pt will comlete adl task at sink supervision level without setup for 19mnutes  Plan Discharge plan remains appropriate    Co-evaluation                 AM-PAC PT "6 Clicks" Daily Activity     Outcome Measure   Help from another person eating meals?: A Little Help from another person taking care of personal grooming?: A Little Help from another person toileting, which includes using toliet, bedpan, or urinal?: A Little Help from another person bathing (including washing, rinsing, drying)?: A Little Help from another person  to put on and taking off regular upper body clothing?: A Little Help from another person to put on and taking off regular lower body clothing?: A Little 6 Click Score: 18    End of Session Equipment Utilized During Treatment: Gait belt;Rolling walker  OT Visit Diagnosis: Unsteadiness on feet (R26.81);Muscle weakness (generalized) (M62.81)   Activity Tolerance Patient tolerated treatment well   Patient Left in chair;with call bell/phone within reach;with family/visitor present   Nurse Communication Mobility status;Precautions  Time: 4650-3546 OT Time Calculation (min): 31 min  Charges: OT General Charges $OT Visit: 1 Visit OT Treatments $Self Care/Home Management : 23-37 mins   Jeri Modena   OTR/L Pager: [phone] Office: [phone] .    Parke Poisson B 02/16/2018, 3:16 PM

## 2018-02-16 NOTE — Progress Notes (Signed)
Pt given discharge instructions, prescriptions, and care notes. Pt verbalized understanding AEB no further questions or concerns at this time. IV was discontinued, no redness, pain, or swelling noted at this time. Telemetry discontinued and Centralized Telemetry was notified. Pt left the floor via wheelchair with staff in stable condition.

## 2018-02-16 NOTE — Care Management Note (Signed)
Case Management Note  Patient Details  Name: Wesley [name] MRN: [id number] Date of Birth: 08-06-1966  Subjective/Objective:        ETOH withdrawal on CIWA/ Acute pancreatitis.           Action/Plan: Transition to home today. Family to provide transportation to home.  Expected Discharge Date:  02/16/18               Expected Discharge Plan:  [place]  In-House Referral:     Discharge planning Services  CM Consult, [facility]  Post Acute Care Choice:    Choice offered to:     DME Arranged:  3-N-1, Walker rolling DME Agency:   [place]:   RN,PT,SW [place] Agency:  [place]  Status of Service:  COMPLETED  If discussed at [place] of Stay Meetings, dates discussed:    Additional Comments:  Sharin Mons, RN 02/16/2018, 12:15 PM

## 2018-02-16 NOTE — Social Work (Signed)
CSW aware that pt is discharging home with home health services arranged by CM.   No transportation needs as pt family is transporting.  CSW signing off. Please consult if any additional needs arise.  Alexander Mt, [place] Work [phone]

## 2018-02-16 NOTE — Discharge Summary (Signed)
Name: Wesley [name] MRN: [id number] DOB: [birth year] 52 y.o. PCP: Marya Landry, NP  Date of Admission: 02/07/2018  7:33 AM Date of Discharge: 02/16/2018 Attending Physician: Lucious Groves, DO  Discharge Diagnosis: 1. Acute pancreatitis  2. Alcohol withdrawal  3. Cholelithiasis  4. Refeeding syndrome  5. Hyponatremia  Principal Problem:   Alcohol withdrawal ([place]) Active Problems:   Acute pancreatitis   Alcohol abuse   Hypokalemia   Cholelithiasis   Hyponatremia   Refeeding syndrome   Discharge Medications: Allergies as of 02/16/2018   No Known Allergies     Medication List    STOP taking these medications   cyclobenzaprine 5 MG tablet Commonly known as:  FLEXERIL   gabapentin 300 MG capsule Commonly known as:  NEURONTIN   ondansetron 4 MG tablet Commonly known as:  ZOFRAN   traMADol 50 MG tablet Commonly known as:  ULTRAM     TAKE these medications   folic acid 1 MG tablet Commonly known as:  FOLVITE Take 1 mg by mouth daily.   multivitamin tablet Take 1 tablet by mouth daily.   nicotine 21 mg/24hr patch Commonly known as:  NICODERM CQ - dosed in mg/24 hours Place 1 patch (21 mg total) onto the skin daily.       Disposition and follow-up:   Mr.Eames N Corliss was discharged from [facility] in Stable condition.  At the hospital follow up visit please address:  1.  Please assess for ongoing GI symptoms (abdominal pain, N/V, poor appetite). Please ensure patient has a scheduled follow up appointment with surgery for elective cholecystectomy and with [facility]. Continue to encourage alcohol cessation.  2.  Labs / imaging needed at time of follow-up: CMP, Magnesium, and Phosphorus   3.  Pending labs/ test needing follow-up: None   Follow-up Appointments: Follow-up Information    Brahmbhatt, Parag, MD. Schedule an appointment as soon as possible for a visit in 2 month(s).   Specialty:  Gastroenterology Contact information: [employer] [place] [address] [phone]        Surgery, [place]. Schedule an appointment as soon as possible for a visit in 3 week(s).   Specialty:  General Surgery Contact information: [address] [place] [address] [phone]        [place], Joellen Jersey, NP Follow up in 1 week(s).   Specialty:  Nurse Practitioner Contact information: [address] [place] [address] [place] [facility] Course by problem list: Principal Problem:   Alcohol withdrawal ([place]) Active Problems:   Acute pancreatitis   Alcohol abuse   Hypokalemia   Cholelithiasis   Hyponatremia   Refeeding syndrome   1. Acute pancreatitis: Patient presented with a 4-[place] history of abdominal pain, nausea, vomiting and poor appetite and CT abdomen/pelvis findings consistent with acute pancreatitis (see full report below). Lipase 1579. He was conservatively managed with aggressive IVF hydration, bowel rest, and pain control. The etiology of patient's pancreatitis was thought to be secondary to gallstones (mild elevation in TBili and cholelithiasis on RUQ [place]) vs alcohol-related given history of heavy alcohol use. GI and surgery were consulted who recommended MRCP (no evidence of choledocholithiasis or CBD dilation, see report below) and outpatient elective cholecystectomy. Patient is to follow up with GI and surgery as an outpatient. Patient's hospitalization was complicated by alcohol withdrawal and refeeding syndrome as described below.   2. Alcohol withdrawal: He has an  extensive history of heavy alcohol use and denied history of withdrawals in the past. On HD#3 patient became altered ann agitated. He was managed with PRN Ativan and was started on Librium. On HD#8 acute encephalopathy was resolved and Librium was tapered down. He was alert and oriented on [place] of discharge. PT/OT recommended SNF due to deconditioning from prolonged hospitalization. However,  patient refused and he was discharged with home health PT/OT services.   3. Cholelithiasis: This was noted on RUQ [place] at the time of admission. No evidence of choledocholithiasis or CBD dilation on imaging. Possible cause of episode of acute pancreatitis given mild elevation in TBili on admission and duodenal edema on imaging. Surgery recommended outpatient elective cholecystectomy.   4. Refeeding syndrome: Patient developed multiple abnormalities during this admission including hypokalemia, hyponatremia, hypomagnesemia, and hypophosphatemia in the setting of malnourishment likely from heavy alcohol use consistent with refeeding syndrome. He was treated with aggressive electrolyte repletion and electrolytes had normalized on [place] of discharge.   5. Hyponatremia: Patient presented with normal serum sodium on admission, Na 136, and became hyponatremic after starting IVF hydration for pancreatitis. Serum and urine studies consistent with hypotonic hyponatremia in a euvolemic patient suggestive of SIADH vs reset osmostat. Given chronic malnutrition and electrolyte abnormalities it was thought patient's clinical picture was more consistent with reset osmostat. His serum sodium improved with fluid restriction and it was 134 on [place] of discharge.    Discharge Vitals:   BP 110/86 (BP Location: Left Arm)   Pulse (!) 110   Temp 98.5 F (36.9 C) (Oral)   Resp 20   Ht 5' 8" (1.727 m)   Wt 124 lb 9 oz (56.5 kg)   SpO2 98%   BMI 18.94 kg/m   Pertinent Labs, Studies, and Procedures:  CBC Latest Ref Rng & Units 02/10/2018 02/09/2018 02/08/2018  WBC 4.0 - 10.5 K/uL 13.4(H) 13.9(H) 13.7(H)  Hemoglobin 13.0 - 17.0 g/dL 14.8 14.2 14.3  Hematocrit 39.0 - 52.0 % 41.3 38.7(L) 40.2  Platelets 150 - 400 K/uL 243 205 195   BMP Latest Ref Rng & Units 02/16/2018 02/15/2018 02/14/2018  Glucose 65 - 99 mg/dL 104(H) 113(H) 98  BUN 6 - 20 mg/dL 5(L) <5(L) 5(L)  Creatinine 0.61 - 1.24 mg/dL 0.86 0.75 0.75  Sodium 135 - 145  mmol/L 134(L) 133(L) 130(L)  Potassium 3.5 - 5.1 mmol/L 3.5 3.6 3.8  Chloride 101 - 111 mmol/L 95(L) 99(L) 95(L)  CO2 22 - 32 mmol/L _0 Calcium 8.9 - 10.3 mg/dL 9.4 9.1 9.1   Lipase: 1,579  CT abdomen/pelvis 3/10: IMPRESSION: 1. Pancreatic and duodenal inflammation. Correlate with pancreatic enzymes to evaluate for primary pancreatitis and/or primary duodenitis. 2. Gallstones without specific evidence of acute cholecystitis. 3. Hyperattenuation in the distal ileum for which active intraluminal bleeding cannot be excluded. Correlate with symptoms of GI bleeding or anemia. 4. Hepatic steatosis. 5.  Aortic Atherosclerosis (ICD10-I70.0). 6. Small volume cul-de-sac fluid, likely secondary to the above Inflammation.  RUQ [place] 3/10: FINDINGS: Gallbladder: Gallstones within the gallbladder, the largest measuring 9 mm. A large amount of sludge noted in the gallbladder. No wall thickening. Negative sonographic Murphy's.  Common bile duct: Diameter: Normal caliber, 6 mm  Liver: Increased echotexture compatible with fatty infiltration. No focal abnormality or biliary ductal dilatation. Portal vein is patent on color Doppler imaging with normal direction of blood flow towards the liver.  MRCP 3/14: IMPRESSION: 1. Mild peripancreatic edema likely with secondary inflammation of the adjacent descending and proximal transverse  duodenum, suggesting acute pancreatitis. No findings of pancreatic necrosis, pseudocyst, or abscess. 2. Cholelithiasis is present, without evidence of choledocholithiasis. No biliary dilatation. 3. Trace upper abdominal ascites and perirenal stranding. 4. Dependent airspace opacities in both lungs. Pneumonia is not excluded. Small bilateral pleural effusions. 5. There are a few scattered small cystic lesions in the liver, compatible with hepatic cysts or biliary hamartomas. Degenerative disc disease at L5-S1.   Discharge Instructions: Discharge  Instructions    Call MD for:  difficulty breathing, headache or visual disturbances   Complete by:  As directed    Call MD for:  extreme fatigue   Complete by:  As directed    Call MD for:  hives   Complete by:  As directed    Call MD for:  persistant dizziness or light-headedness   Complete by:  As directed    Call MD for:  persistant nausea and vomiting   Complete by:  As directed    Call MD for:  redness, tenderness, or signs of infection (pain, swelling, redness, odor or green/yellow discharge around incision site)   Complete by:  As directed    Call MD for:  severe uncontrolled pain   Complete by:  As directed    Call MD for:  temperature >100.4   Complete by:  As directed    Diet - low sodium heart healthy   Complete by:  As directed    Discharge instructions   Complete by:  As directed    You were hospitalized for pancreatitis due to your alcohol use; you were also seen by a surgeon who recommends you follow up with them in the next month to schedule a surgery to take out your gallbladder as the stones in your gallbladder can also make it more likely to have another episode of pancreatitis.   During your hospitalization, you were detoxed from alcohol. I would encourage you to not take up drinking alcohol again; it can make you have another episode of pancreatitis which is serious.   Please follow up with your primary care doctor in about a week.  Please schedule an appointment with [facility] gastroenterology in 2 months Please schedule an appointment with [facility] Surgery in 1 month.  Please continue to work with physical therapy to help regain your strength after this hospitalization and illness.   Increase activity slowly   Complete by:  As directed       Signed: Welford Roche, MD 02/18/2018, 5:26 PM   Pager: [phone]

## 2018-02-16 NOTE — Progress Notes (Signed)
Subjective:  No acute events overnight. HR remains in 100s. States he is feeling well today. Discussed with patient he can continue to recuperate at home with [facility] services. We states he would like to go home by himself and not to his mother's house. Also discussed importance of alcohol cessation and potential risks of continuous alcohol intake in the future. Patient verbalized understanding and in agreement with plan. All questions answered.   Objective:  Vital signs in last 24 hours: Vitals:   02/15/18 2329 02/16/18 0411 02/16/18 0428 02/16/18 0756  BP: 107/86 106/77  97/75  Pulse: (!) 104  (!) 117 (!) 110  Resp: _0 Temp:  98.8 F (37.1 C)  98.5 F (36.9 C)  TempSrc:  Oral  Oral  SpO2: 95%  92% 98%  Weight:      Height:       Physical Exam  Constitutional: He is oriented to person, place, and time. He appears well-developed and well-nourished. No distress.  Cardiovascular: Regular rhythm and normal heart sounds. Tachycardia present. Exam reveals no gallop and no friction rub.  No murmur heard. Pulmonary/Chest: Effort normal and breath sounds normal. No respiratory distress. He has no wheezes. He has no rales.  Abdominal: Soft. Bowel sounds are normal. He exhibits no distension. There is no tenderness.  Musculoskeletal: Normal range of motion. He exhibits no edema.  Neurological: He is alert and oriented to person, place, and time.    Assessment/Plan:  Principal Problem:   Alcohol withdrawal ([place]) Active Problems:   Acute pancreatitis   Alcohol abuse   Hypokalemia   Cholelithiasis   Hyponatremia   Refeeding syndrome  Alcohol withdrawal:  Alert and oriented x3. Patient remains tachycardic but is symptomatic and stable. Librium discontinued yesterday. PT/OT recommended SNF, but patient declined and will be discharged home today with [facility] services including PT, RN, and SW.  - Continue to educate patient about EtOH cessation - CM consult, appreciate assistance      # Tachycardia: Asymptomatic and stable.  - Continue to monitor   # Hyponatremia: Likely chronic; continues to improve, 134 today. Studies from earlier this week point towards SIADH vs reset osmostat.  - Continue fluid restriction   # Refeeding syndrome: Resolved. Electrolytes stable. - Continue to monitor and replete PRN  # Acute pancreatitis: Resolved. EtOH vs gallstone. Patient to f/u outpatient with surgery for elective cholecystectomy. Advised EtOH cessation.   Dispo: Anticipated discharge today.   Welford Roche, MD 02/16/2018, 10:16 AM Pager: [phone]

## 2018-04-05 ENCOUNTER — Ambulatory Visit: Payer: Self-pay | Admitting: Surgery
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
## 2018-12-07 ENCOUNTER — Other Ambulatory Visit: Payer: Self-pay

## 2018-12-07 ENCOUNTER — Emergency Department (HOSPITAL_COMMUNITY): Payer: Self-pay

## 2018-12-07 ENCOUNTER — Encounter (HOSPITAL_COMMUNITY): Payer: Self-pay | Admitting: *Deleted

## 2018-12-07 ENCOUNTER — Inpatient Hospital Stay (HOSPITAL_COMMUNITY)
Admission: EM | Admit: 2018-12-07 | Discharge: 2018-12-14 | DRG: 418 | Disposition: A | Discharge status: Home / Self Care | Payer: Self-pay | Attending: Internal Medicine | Admitting: Internal Medicine

## 2018-12-07 DIAGNOSIS — F17200 Nicotine dependence, unspecified, uncomplicated: Secondary | ICD-10-CM

## 2018-12-07 DIAGNOSIS — E876 Hypokalemia: Secondary | ICD-10-CM | POA: Diagnosis present

## 2018-12-07 DIAGNOSIS — Z419 Encounter for procedure for purposes other than remedying health state, unspecified: Secondary | ICD-10-CM

## 2018-12-07 DIAGNOSIS — Z79891 Long term (current) use of opiate analgesic: Secondary | ICD-10-CM

## 2018-12-07 DIAGNOSIS — F1011 Alcohol abuse, in remission: Secondary | ICD-10-CM | POA: Diagnosis present

## 2018-12-07 DIAGNOSIS — Z791 Long term (current) use of non-steroidal anti-inflammatories (NSAID): Secondary | ICD-10-CM

## 2018-12-07 DIAGNOSIS — K660 Peritoneal adhesions (postprocedural) (postinfection): Secondary | ICD-10-CM | POA: Diagnosis present

## 2018-12-07 DIAGNOSIS — K861 Other chronic pancreatitis: Secondary | ICD-10-CM | POA: Diagnosis present

## 2018-12-07 DIAGNOSIS — E440 Moderate protein-calorie malnutrition: Secondary | ICD-10-CM | POA: Diagnosis present

## 2018-12-07 DIAGNOSIS — K859 Acute pancreatitis without necrosis or infection, unspecified: Secondary | ICD-10-CM

## 2018-12-07 DIAGNOSIS — F1010 Alcohol abuse, uncomplicated: Secondary | ICD-10-CM

## 2018-12-07 DIAGNOSIS — Z79899 Other long term (current) drug therapy: Secondary | ICD-10-CM

## 2018-12-07 DIAGNOSIS — R945 Abnormal results of liver function studies: Secondary | ICD-10-CM | POA: Diagnosis present

## 2018-12-07 DIAGNOSIS — Z820 Family history of epilepsy and other diseases of the nervous system: Secondary | ICD-10-CM

## 2018-12-07 DIAGNOSIS — R7989 Other specified abnormal findings of blood chemistry: Secondary | ICD-10-CM | POA: Diagnosis present

## 2018-12-07 DIAGNOSIS — K8064 Calculus of gallbladder and bile duct with chronic cholecystitis without obstruction: Secondary | ICD-10-CM | POA: Diagnosis present

## 2018-12-07 DIAGNOSIS — F17210 Nicotine dependence, cigarettes, uncomplicated: Secondary | ICD-10-CM | POA: Diagnosis present

## 2018-12-07 DIAGNOSIS — K851 Biliary acute pancreatitis without necrosis or infection: Principal | ICD-10-CM | POA: Diagnosis present

## 2018-12-07 DIAGNOSIS — R1011 Right upper quadrant pain: Secondary | ICD-10-CM

## 2018-12-07 DIAGNOSIS — Z8249 Family history of ischemic heart disease and other diseases of the circulatory system: Secondary | ICD-10-CM

## 2018-12-07 DIAGNOSIS — K760 Fatty (change of) liver, not elsewhere classified: Secondary | ICD-10-CM | POA: Diagnosis present

## 2018-12-07 DIAGNOSIS — K8020 Calculus of gallbladder without cholecystitis without obstruction: Secondary | ICD-10-CM

## 2018-12-07 LAB — COMPREHENSIVE METABOLIC PANEL
ALT: 154 U/L — ABNORMAL HIGH (ref 0–44)
AST: 223 U/L — ABNORMAL HIGH (ref 15–41)
Albumin: 3.8 g/dL (ref 3.5–5.0)
Alkaline Phosphatase: 90 U/L (ref 38–126)
Anion gap: 16 — ABNORMAL HIGH (ref 5–15)
BUN: <5 mg/dL — ABNORMAL LOW (ref 6–20)
CO2: 26 mmol/L (ref 22–32)
Calcium: 9.8 mg/dL (ref 8.9–10.3)
Chloride: 93 mmol/L — ABNORMAL LOW (ref 98–111)
Creatinine, Ser: 0.94 mg/dL (ref 0.61–1.24)
GFR calc Af Amer: >60 mL/min (ref >60)
GFR calc non Af Amer: >60 mL/min (ref >60)
Glucose, Bld: 91 mg/dL (ref 70–99)
Potassium: 3.2 mmol/L — ABNORMAL LOW (ref 3.5–5.1)
Sodium: 135 mmol/L (ref 135–145)
Total Bilirubin: 3 mg/dL — ABNORMAL HIGH (ref 0.3–1.2)
Total Protein: 8.6 g/dL — ABNORMAL HIGH (ref 6.5–8.1)

## 2018-12-07 LAB — CBC
HCT: 51.7 % (ref 39.0–52.0)
Hemoglobin: 17.9 g/dL — ABNORMAL HIGH (ref 13.0–17.0)
MCH: 33.3 pg (ref 26.0–34.0)
MCHC: 34.6 g/dL (ref 30.0–36.0)
MCV: 96.1 fL (ref 80.0–100.0)
Platelets: 212 10*3/uL (ref 150–400)
RBC: 5.38 MIL/uL (ref 4.22–5.81)
RDW: 11.7 % (ref 11.5–15.5)
WBC: 8.8 10*3/uL (ref 4.0–10.5)
nRBC: 0 % (ref 0.0–0.2)

## 2018-12-07 LAB — URINALYSIS, ROUTINE W REFLEX MICROSCOPIC
Glucose, UA: NEGATIVE mg/dL
Ketones, ur: 20 mg/dL — AB
Leukocytes, UA: NEGATIVE
Nitrite: NEGATIVE
Protein, ur: 100 mg/dL — AB
Specific Gravity, Urine: 1.039 — ABNORMAL HIGH (ref 1.005–1.030)
pH: 5 (ref 5.0–8.0)

## 2018-12-07 LAB — LIPASE, BLOOD: Lipase: 1048 U/L — ABNORMAL HIGH (ref 11–51)

## 2018-12-07 LAB — TROPONIN I: Troponin I: <0.03 ng/mL (ref <0.03)

## 2018-12-07 MED ORDER — SODIUM CHLORIDE 0.9 % IV BOLUS
1000.0000 mL | Freq: Once | INTRAVENOUS | Status: AC
  Administered 2018-12-07: 1000 mL via INTRAVENOUS

## 2018-12-07 MED ORDER — HYDROMORPHONE HCL 1 MG/ML IJ SOLN
1.0000 mg | Freq: Once | INTRAMUSCULAR | Status: AC
  Administered 2018-12-07: 1 mg via INTRAVENOUS
  Filled 2018-12-07: qty 1

## 2018-12-07 MED ORDER — ONDANSETRON HCL 4 MG/2ML IJ SOLN
4.0000 mg | Freq: Once | INTRAMUSCULAR | Status: AC
  Administered 2018-12-07: 4 mg via INTRAVENOUS
  Filled 2018-12-07: qty 2
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
## 2018-12-07 NOTE — ED Triage Notes (Signed)
Pt c/o L lower abd pain radiating into L flank and across chest since Sunday with sob, NV. Pt reports being constipated and when trying to have a BM he starts vomiting as well. Hx of gallstones
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
## 2018-12-08 ENCOUNTER — Observation Stay (HOSPITAL_COMMUNITY): Payer: Self-pay

## 2018-12-08 DIAGNOSIS — R945 Abnormal results of liver function studies: Secondary | ICD-10-CM | POA: Diagnosis present

## 2018-12-08 DIAGNOSIS — R7989 Other specified abnormal findings of blood chemistry: Secondary | ICD-10-CM | POA: Diagnosis present

## 2018-12-08 LAB — BASIC METABOLIC PANEL
Anion gap: 10 (ref 5–15)
BUN: <5 mg/dL — ABNORMAL LOW (ref 6–20)
CO2: 24 mmol/L (ref 22–32)
Calcium: 9 mg/dL (ref 8.9–10.3)
Chloride: 101 mmol/L (ref 98–111)
Creatinine, Ser: 0.8 mg/dL (ref 0.61–1.24)
GFR calc Af Amer: >60 mL/min (ref >60)
GFR calc non Af Amer: >60 mL/min (ref >60)
Glucose, Bld: 148 mg/dL — ABNORMAL HIGH (ref 70–99)
Potassium: 4.1 mmol/L (ref 3.5–5.1)
Sodium: 135 mmol/L (ref 135–145)

## 2018-12-08 LAB — PROTIME-INR
INR: 1.17
Prothrombin Time: 14.8 seconds (ref 11.4–15.2)

## 2018-12-08 LAB — CBC
HCT: 46.3 % (ref 39.0–52.0)
Hemoglobin: 16.1 g/dL (ref 13.0–17.0)
MCH: 33.6 pg (ref 26.0–34.0)
MCHC: 34.8 g/dL (ref 30.0–36.0)
MCV: 96.7 fL (ref 80.0–100.0)
Platelets: 183 10*3/uL (ref 150–400)
RBC: 4.79 MIL/uL (ref 4.22–5.81)
RDW: 11.7 % (ref 11.5–15.5)
WBC: 9.1 10*3/uL (ref 4.0–10.5)
nRBC: 0 % (ref 0.0–0.2)

## 2018-12-08 LAB — LIPASE, BLOOD: Lipase: 923 U/L — ABNORMAL HIGH (ref 11–51)

## 2018-12-08 LAB — HIV ANTIBODY (ROUTINE TESTING W REFLEX): HIV Screen 4th Generation wRfx: NONREACTIVE

## 2018-12-08 LAB — HEPATIC FUNCTION PANEL
ALT: 122 U/L — ABNORMAL HIGH (ref 0–44)
AST: 164 U/L — ABNORMAL HIGH (ref 15–41)
Albumin: 3.2 g/dL — ABNORMAL LOW (ref 3.5–5.0)
Alkaline Phosphatase: 78 U/L (ref 38–126)
Bilirubin, Direct: 0.8 mg/dL — ABNORMAL HIGH (ref 0.0–0.2)
Indirect Bilirubin: 1.4 mg/dL — ABNORMAL HIGH (ref 0.3–0.9)
Total Bilirubin: 2.2 mg/dL — ABNORMAL HIGH (ref 0.3–1.2)
Total Protein: 7.2 g/dL (ref 6.5–8.1)

## 2018-12-08 LAB — MAGNESIUM: Magnesium: 1.5 mg/dL — ABNORMAL LOW (ref 1.7–2.4)

## 2018-12-08 LAB — APTT: aPTT: 30 seconds (ref 24–36)

## 2018-12-08 LAB — TRIGLYCERIDES: Triglycerides: 80 mg/dL (ref <150)

## 2018-12-08 MED ORDER — ADULT MULTIVITAMIN W/MINERALS CH
1.0000 | ORAL_TABLET | Freq: Every day | ORAL | Status: DC
  Administered 2018-12-08 – 2018-12-14 (×6): 1 via ORAL
  Filled 2018-12-08 (×6): qty 1

## 2018-12-08 MED ORDER — ZOLPIDEM TARTRATE 5 MG PO TABS
5.0000 mg | ORAL_TABLET | Freq: Every evening | ORAL | Status: DC | PRN
  Administered 2018-12-08 – 2018-12-11 (×2): 5 mg via ORAL
  Filled 2018-12-08 (×2): qty 1

## 2018-12-08 MED ORDER — ONDANSETRON HCL 4 MG/2ML IJ SOLN
4.0000 mg | Freq: Three times a day (TID) | INTRAMUSCULAR | Status: DC | PRN
  Administered 2018-12-08 – 2018-12-11 (×5): 4 mg via INTRAVENOUS
  Filled 2018-12-08 (×5): qty 2

## 2018-12-08 MED ORDER — HYDROMORPHONE HCL 1 MG/ML IJ SOLN
1.0000 mg | INTRAMUSCULAR | Status: DC | PRN
  Administered 2018-12-08 – 2018-12-14 (×19): 1 mg via INTRAVENOUS
  Filled 2018-12-08 (×20): qty 1

## 2018-12-08 MED ORDER — NICOTINE 21 MG/24HR TD PT24
21.0000 mg | MEDICATED_PATCH | Freq: Every day | TRANSDERMAL | Status: DC
  Administered 2018-12-11: 21 mg via TRANSDERMAL
  Filled 2018-12-08 (×6): qty 1

## 2018-12-08 MED ORDER — FOLIC ACID 1 MG PO TABS
1.0000 mg | ORAL_TABLET | Freq: Every day | ORAL | Status: DC
  Administered 2018-12-08 – 2018-12-14 (×6): 1 mg via ORAL
  Filled 2018-12-08 (×6): qty 1

## 2018-12-08 MED ORDER — SENNOSIDES-DOCUSATE SODIUM 8.6-50 MG PO TABS: 1.0000 | ORAL_TABLET | Freq: Every evening | ORAL | Status: DC | PRN

## 2018-12-08 MED ORDER — GADOBUTROL 1 MMOL/ML IV SOLN
7.5000 mL | Freq: Once | INTRAVENOUS | Status: AC | PRN
  Administered 2018-12-08: 7.5 mL via INTRAVENOUS

## 2018-12-08 MED ORDER — POTASSIUM CHLORIDE 20 MEQ/15ML (10%) PO SOLN
40.0000 meq | Freq: Once | ORAL | Status: AC
  Administered 2018-12-08: 40 meq via ORAL
  Filled 2018-12-08: qty 30

## 2018-12-08 MED ORDER — HEPARIN SODIUM (PORCINE) 5000 UNIT/ML IJ SOLN
5000.0000 [IU] | Freq: Three times a day (TID) | INTRAMUSCULAR | Status: DC
  Administered 2018-12-08 – 2018-12-14 (×14): 5000 [IU] via SUBCUTANEOUS
  Filled 2018-12-08 (×15): qty 1

## 2018-12-08 MED ORDER — SODIUM CHLORIDE 0.9 % IV SOLN
INTRAVENOUS | Status: DC
  Administered 2018-12-08 – 2018-12-09 (×4): via INTRAVENOUS

## 2018-12-08 MED ORDER — OXYCODONE HCL 5 MG PO TABS
5.0000 mg | ORAL_TABLET | ORAL | Status: DC | PRN
  Administered 2018-12-08 – 2018-12-14 (×10): 5 mg via ORAL
  Filled 2018-12-08 (×10): qty 1

## 2018-12-08 MED ORDER — MAGNESIUM SULFATE 4 GM/100ML IV SOLN
4.0000 g | Freq: Once | INTRAVENOUS | Status: AC
  Administered 2018-12-08: 4 g via INTRAVENOUS
  Filled 2018-12-08: qty 100

## 2018-12-08 NOTE — Care Management Note (Signed)
Case Management Note  Patient Details  Name: Wesley [name] MRN: [id number] Date of Birth: May 18, 1966  Subjective/Objective:     Pt in with pancreatitis. He is from home with his girlfriend that is disabled.  Pt has been seen at [facility] but he has trouble with the co pays and cost of medications.  DME: none Pt denies issues with transportation.               Action/Plan: CM met with the patient and he would like to attend one of the [facility]. CM was able to get him an appt at [place]. Information on the AVS. Pt can also use [facility] pharmacy for medication assistance. Pt provided this information. CM following for further d/c needs.   Expected Discharge Date:                  Expected Discharge Plan:     In-House Referral:     Discharge planning Services  CM Consult, [facility], Medication Assistance  Post Acute Care Choice:    Choice offered to:     DME Arranged:    DME Agency:     HH Arranged:    HH Agency:     Status of Service:  In process, will continue to follow  If discussed at Long Length of Stay Meetings, dates discussed:    Additional Comments:  Pollie Friar, RN 12/08/2018, 12:01 PM

## 2018-12-08 NOTE — ED Notes (Signed)
Dr Blaine Hamper at bedside
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
## 2018-12-08 NOTE — Progress Notes (Addendum)
PROGRESS NOTE                                                                                                                                                                                                             Patient Demographics:    Wesley [name], is a 53 y.o. male, DOB - 03/10/1966, INO:[id number]  Admit date - 12/07/2018   Admitting Physician Ivor Costa, MD  Outpatient Primary MD for the patient is [name], NP  LOS - 0  Outpatient Specialists:None  Chief Complaint  Patient presents with  . Abdominal Pain       Brief Narrative 53 year old male with alcohol abuse (currently in remission for almost 1 year since last hospitalization), alcoholic pancreatitis, tobacco abuse, cholecystitis, pneumothorax presented to the ED with nausea, vomiting, left upper quadrant abdominal pain radiating to the back for almost 3 days. Patient was hospitalized back in March 2019 with acute pancreatitis possibly alcohol-related versus gallstone.  MRCP done at that time negative for CBD dilation or choledocholithiasis and was recommended for outpatient elective cholecystectomy.  However due to insurance issues he did not follow-up with surgery. Patient found to have elevated lipase greater than 10,000 with AST of 223, ALT of 154, total bili of 3 and normal alkaline phosphatase.  Ultrasound abdomen showed fatty liver and uncomplicated cholelithiasis. Patient admitted for recurrent pancreatitis.  Subjective:   Patient still reports off-and-on pain in his left upper quadrant radiating to the back episodes of vomiting this morning.   Assessment  & Plan :    Principal Problem:   Pancreatitis, recurrent  Patient has quit alcohol for almost 10 months.  MRCP shows uncomplicated pancreatitis without fluid or necrosis.  Cholelithiasis without choledocholithiasis.  There is tapering of the CBD to the pancreatic head which may be related  to edema of the pancreatic head and a passed CBD stone cannot be ruled out. Suspect gallstone pancreatitis.  Triglycerides normal.  Will consult general surgery. Monitor lipase and LFTs closely.  N.p.o. and aggressive IV fluids. Pain control with PRN Dilaudid, supportive care with antiemetics.  Active Problems: Tobacco use Counseled strongly on cessation.  Nicotine patch ordered    Hypokalemia/hypomagnesemia Replenished.       Code Status : Full code  Family Communication  : None at bedside  Disposition Plan  : Home pending clinical improvement  Barriers  For Discharge : Active symptoms  Consults  : Surgery  Procedures  : Ultrasound abdomen, MRCP  DVT Prophylaxis  :  Lovenox -   Lab Results  Component Value Date   PLT 183 12/08/2018    Antibiotics  :    Anti-infectives (From admission, onward)   None        Objective:   Vitals:   12/08/18 0114 12/08/18 0528 12/08/18 0755 12/08/18 1202  BP: (!) 138/94 (!) 122/96 131/85 (!) 120/92  Pulse: 74 96 89 100  Resp: _0 Temp:  98.4 F (36.9 C) 97.9 F (36.6 C) 97.7 F (36.5 C)  TempSrc:  Oral Oral Oral  SpO2: 100% 95% 100% 98%    Wt Readings from Last 3 Encounters:  02/07/18 56.5 kg  05/31/15 61.2 kg  11/19/14 65.8 kg     Intake/Output Summary (Last 24 hours) at 12/08/2018 1209 Last data filed at 12/08/2018 0630 Gross per 24 hour  Intake 1060 ml  Output -  Net 1060 ml     Physical Exam  Gen: not in distress fatigue HEENT: no pallor, dry mucosa, icterus, supple neck Chest: clear b/l, no added sounds CVS: N S1&S2, no murmurs,  GI: soft, nondistended, epigastric and left upper quadrant tenderness to pressure, bowel sounds present Musculoskeletal: warm, no edema     Data Review:    CBC Recent Labs  Lab 12/07/18 1951 12/08/18 0200  WBC 8.8 9.1  HGB 17.9* 16.1  HCT 51.7 46.3  PLT 212 183  MCV 96.1 96.7  MCH 33.3 33.6  MCHC 34.6 34.8  RDW 11.7 11.7    Chemistries  Recent Labs    Lab 12/07/18 1951 12/08/18 0200  NA 135 135  K 3.2* 4.1  CL 93* 101  CO2 26 24  GLUCOSE 91 148*  BUN <5* <5*  CREATININE 0.94 0.80  CALCIUM 9.8 9.0  MG  --  1.5*  AST 223*  --   ALT 154*  --   ALKPHOS 90  --   BILITOT 3.0*  --    ------------------------------------------------------------------------------------------------------------------ Recent Labs    12/07/18 1951  TRIG 80    No results found for: HGBA1C ------------------------------------------------------------------------------------------------------------------ No results for input(s): TSH, T4TOTAL, T3FREE, THYROIDAB in the last 72 hours.  Invalid input(s): FREET3 ------------------------------------------------------------------------------------------------------------------ No results for input(s): VITAMINB12, FOLATE, FERRITIN, TIBC, IRON, RETICCTPCT in the last 72 hours.  Coagulation profile Recent Labs  Lab 12/08/18 0200  INR 1.17    No results for input(s): DDIMER in the last 72 hours.  Cardiac Enzymes Recent Labs  Lab 12/07/18 1951  TROPONINI <0.03   ------------------------------------------------------------------------------------------------------------------ No results found for: BNP  Inpatient Medications  Scheduled Meds: . folic acid  1 mg Oral Daily  . heparin  5,000 Units Subcutaneous Q8H  . multivitamin with minerals  1 tablet Oral Daily  . nicotine  21 mg Transdermal Daily   Continuous Infusions: . sodium chloride 150 mL/hr at 12/08/18 0749  . magnesium sulfate 1 - 4 g bolus IVPB 4 g (12/08/18 1024)   PRN Meds:.HYDROmorphone (DILAUDID) injection, ondansetron (ZOFRAN) IV, oxyCODONE, senna-docusate, zolpidem  Micro Results No results found for this or any previous visit (from the past 240 hour(s)).  Radiology Reports Mr 3d Recon At Scanner  Result Date: 12/08/2018 CLINICAL DATA:  Pancreatitis.  Nausea and vomiting. EXAM: MRI ABDOMEN WITHOUT AND WITH CONTRAST  (INCLUDING MRCP) TECHNIQUE: Multiplanar multisequence MR imaging of the abdomen was performed both before and after the administration of intravenous contrast. Heavily T2-weighted  images of the biliary and pancreatic ducts were obtained, and three-dimensional MRCP images were rendered by post processing. CONTRAST:  7.5 mL Gadavist COMPARISON:  Ultrasound 12/07/2018 FINDINGS: Lower chest: Trace pleural effusions. Mild LEFT basilar atelectasis. Hepatobiliary: Several nonenhancing hepatic cysts are present throughout the liver. No intrahepatic biliary duct dilatation. There multiple dependent gallstones within the lumen of the gallbladder measuring from 5-10 mm and numbering approximately 10. No gallbladder wall thickening or gallbladder distension. No pericholecystic fluid. The common bile duct common and hepatic duct are normal caliber. No choledocholithiasis. There is tapering of the common bile duct as it passes through the pancreatic head (image 15/16). The more most distal pancreatic duct regains normal caliber. Pancreas: There is peripancreatic edema and fluid surrounding the head body and tail the pancreas. The pancreatic duct is prominent. No evidence of pancreatic ductal interruption. The head of the pancreas is edematous. The pancreatic parenchyma enhances uniformly. Fluid extends along the anterior margin LEFT pararenal fascia and along the LEFT pericolic gutter. No organized fluid collections. Spleen: Normal spleen. Adrenals/urinary tract: Adrenal glands and kidneys are normal. Stomach/Bowel: Stomach and limited of the small bowel is unremarkable Vascular/Lymphatic: No evidence of vascular complication associated pancreatitis. Musculoskeletal: No aggressive osseous lesion IMPRESSION: 1. Acute non complicated pancreatitis. No organized fluid collections. No ductal interruption. No pancreatic necrosis. 2. Cholelithiasis without evidence of choledocholithiasis. 3. Tapering of the common bile duct through the  pancreatic head may relate to edema of the pancreatic head. A passed common bile duct stone could have a similar effect. No mass lesion identified. 4. Small LEFT effusion and atelectasis. Electronically Signed   By: Suzy Bouchard M.D.   On: 12/08/2018 11:27   US Abdomen Limited  Result Date: 12/07/2018 CLINICAL DATA:  Right upper quadrant and epigastric pain. EXAM: ULTRASOUND ABDOMEN LIMITED RIGHT UPPER QUADRANT COMPARISON:  MRCP 02/11/2018, ultrasound abdomen 02/07/2018 FINDINGS: Gallbladder: Physiologic distention of the gallbladder without wall thickening or pericholecystic fluid. Gallstones are noted near the neck of the gallbladder admixed with biliary sludge. Common bile duct: Diameter: 6.7 mm Liver: Echogenic liver parenchyma consistent with steatosis. No space-occupying mass. Portal vein is patent on color Doppler imaging with normal direction of blood flow towards the liver. IMPRESSION: Echogenic liver parenchyma consistent with steatosis. Uncomplicated cholelithiasis. Electronically Signed   By: Ashley Royalty M.D.   On: 12/07/2018 23:31   [name]  Result Date: 12/08/2018 CLINICAL DATA:  Pancreatitis.  Nausea and vomiting. EXAM: MRI ABDOMEN WITHOUT AND WITH CONTRAST (INCLUDING MRCP) TECHNIQUE: Multiplanar multisequence MR imaging of the abdomen was performed both before and after the administration of intravenous contrast. Heavily T2-weighted images of the biliary and pancreatic ducts were obtained, and three-dimensional MRCP images were rendered by post processing. CONTRAST:  7.5 mL Gadavist COMPARISON:  Ultrasound 12/07/2018 FINDINGS: Lower chest: Trace pleural effusions. Mild LEFT basilar atelectasis. Hepatobiliary: Several nonenhancing hepatic cysts are present throughout the liver. No intrahepatic biliary duct dilatation. There multiple dependent gallstones within the lumen of the gallbladder measuring from 5-10 mm and numbering approximately 10. No gallbladder wall  thickening or gallbladder distension. No pericholecystic fluid. The common bile duct common and hepatic duct are normal caliber. No choledocholithiasis. There is tapering of the common bile duct as it passes through the pancreatic head (image 15/16). The more most distal pancreatic duct regains normal caliber. Pancreas: There is peripancreatic edema and fluid surrounding the head body and tail the pancreas. The pancreatic duct is prominent. No evidence of pancreatic ductal interruption. The head of  the pancreas is edematous. The pancreatic parenchyma enhances uniformly. Fluid extends along the anterior margin LEFT pararenal fascia and along the LEFT pericolic gutter. No organized fluid collections. Spleen: Normal spleen. Adrenals/urinary tract: Adrenal glands and kidneys are normal. Stomach/Bowel: Stomach and limited of the small bowel is unremarkable Vascular/Lymphatic: No evidence of vascular complication associated pancreatitis. Musculoskeletal: No aggressive osseous lesion IMPRESSION: 1. Acute non complicated pancreatitis. No organized fluid collections. No ductal interruption. No pancreatic necrosis. 2. Cholelithiasis without evidence of choledocholithiasis. 3. Tapering of the common bile duct through the pancreatic head may relate to edema of the pancreatic head. A passed common bile duct stone could have a similar effect. No mass lesion identified. 4. Small LEFT effusion and atelectasis. Electronically Signed   By: Suzy Bouchard M.D.   On: 12/08/2018 11:27    Time Spent in minutes  25   Min Collymore M.D on 12/08/2018 at 12:09 PM  Between 7am to 7pm - Pager - [phone]  After 7pm go to [email or web address] - password [email or web address]  [facility] -  Office  [phone]

## 2018-12-09 DIAGNOSIS — R6889 Other general symptoms and signs: Secondary | ICD-10-CM

## 2018-12-09 DIAGNOSIS — K851 Biliary acute pancreatitis without necrosis or infection: Principal | ICD-10-CM

## 2018-12-09 LAB — HEPATITIS PANEL, ACUTE
HCV Ab: >11 s/co ratio — ABNORMAL HIGH (ref 0.0–0.9)
Hep A IgM: NEGATIVE
Hep B C IgM: NEGATIVE
Hepatitis B Surface Ag: NEGATIVE

## 2018-12-09 LAB — COMPREHENSIVE METABOLIC PANEL
ALT: 92 U/L — ABNORMAL HIGH (ref 0–44)
AST: 123 U/L — ABNORMAL HIGH (ref 15–41)
Albumin: 2.7 g/dL — ABNORMAL LOW (ref 3.5–5.0)
Alkaline Phosphatase: 69 U/L (ref 38–126)
Anion gap: 12 (ref 5–15)
BUN: <5 mg/dL — ABNORMAL LOW (ref 6–20)
CO2: 22 mmol/L (ref 22–32)
Calcium: 8.1 mg/dL — ABNORMAL LOW (ref 8.9–10.3)
Chloride: 99 mmol/L (ref 98–111)
Creatinine, Ser: 0.87 mg/dL (ref 0.61–1.24)
GFR calc Af Amer: >60 mL/min (ref >60)
GFR calc non Af Amer: >60 mL/min (ref >60)
Glucose, Bld: 56 mg/dL — ABNORMAL LOW (ref 70–99)
Potassium: 3.1 mmol/L — ABNORMAL LOW (ref 3.5–5.1)
Sodium: 133 mmol/L — ABNORMAL LOW (ref 135–145)
Total Bilirubin: 3 mg/dL — ABNORMAL HIGH (ref 0.3–1.2)
Total Protein: 6.3 g/dL — ABNORMAL LOW (ref 6.5–8.1)

## 2018-12-09 LAB — MAGNESIUM: Magnesium: 1.8 mg/dL (ref 1.7–2.4)

## 2018-12-09 LAB — LIPASE, BLOOD: Lipase: 549 U/L — ABNORMAL HIGH (ref 11–51)

## 2018-12-09 MED ORDER — POTASSIUM CHLORIDE 2 MEQ/ML IV SOLN
INTRAVENOUS | Status: DC
  Administered 2018-12-09 (×2): via INTRAVENOUS
  Filled 2018-12-09 (×5): qty 1000

## 2018-12-09 MED ORDER — MAGNESIUM SULFATE 2 GM/50ML IV SOLN
2.0000 g | Freq: Once | INTRAVENOUS | Status: AC
  Administered 2018-12-09: 2 g via INTRAVENOUS
  Filled 2018-12-09: qty 50
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
## 2018-12-09 NOTE — Progress Notes (Signed)
PROGRESS NOTE                                                                                                                                                                                                             Patient Demographics:    Wesley [name], is a 53 y.o. male, DOB - 11-11-1966, VQQ:[id number]  Admit date - 12/07/2018   Admitting Physician Ivor Costa, MD  Outpatient Primary MD for the patient is [name], NP  LOS - 1  Outpatient Specialists:None  Chief Complaint  Patient presents with  . Abdominal Pain       Brief Narrative 53 year old male with alcohol abuse (currently in remission for almost 1 year since last hospitalization), alcoholic pancreatitis, tobacco abuse, cholecystitis, pneumothorax presented to the ED with nausea, vomiting, left upper quadrant abdominal pain radiating to the back for almost 3 days. Patient was hospitalized back in March 2019 with acute pancreatitis possibly alcohol-related versus gallstone.  MRCP done at that time negative for CBD dilation or choledocholithiasis and was recommended for outpatient elective cholecystectomy.  However due to insurance issues he did not follow-up with surgery. Patient found to have elevated lipase greater than 10,000 with AST of 223, ALT of 154, total bili of 3 and normal alkaline phosphatase.  Ultrasound abdomen showed fatty liver and uncomplicated cholelithiasis. Patient admitted for recurrent pancreatitis.  Subjective:   Had fever of 100.9 F.  Still having a lot of pain in his left upper quadrant and epigastric area but later tolerated on pain medication.  No nausea vomiting and wants to try some clears.   Assessment  & Plan :    Principal Problem:   Pancreatitis, recurrent  Patient has quit alcohol for almost 10 months.  Suspected gallstone pancreatitis.  MRCP with findings of pancreatitis and cholelithiasis. LFTs and lipase slowly  trending down. Surgery consulted and plan on laparoscopic cholecystectomy prior to discharge once his abdominal pain is better and LFTs/lipase further improved. Continue PRN IV Dilaudid.  Aggressive IV hydration (switched to D5/NS with potassium supplement).  PRN antiemetics.  Will try some clear liquid.  Active Problems: Tobacco use Counseled strongly on cessation.  Continue nicotine patch.    Hypokalemia/hypomagnesemia Continue to replenish.  Fever on 1/8 Possibly associated with pain and acute illness.  Will monitor for now.     Code Status : Full  code  Family Communication  : Mother and aunt at bedside  Disposition Plan  : Home pending clinical improvement, laparoscopic cholecystectomy  Barriers For Discharge : Active symptoms  Consults  : Surgery  Procedures  : Ultrasound abdomen, MRCP  DVT Prophylaxis  :  Lovenox -   Lab Results  Component Value Date   PLT 183 12/08/2018    Antibiotics  :    Anti-infectives (From admission, onward)   None        Objective:   Vitals:   12/08/18 2333 12/09/18 0428 12/09/18 0819 12/09/18 1140  BP: 128/87 127/82 (!) 143/92 136/87  Pulse: (!) 108 (!) 112 (!) 110 99  Resp: _0 Temp: 98.1 F (36.7 C) (!) 100.9 F (38.3 C) 98 F (36.7 C) 100.3 F (37.9 C)  TempSrc: Oral Oral Oral Oral  SpO2: 91% 100% 97% 96%    Wt Readings from Last 3 Encounters:  02/07/18 56.5 kg  05/31/15 61.2 kg  11/19/14 65.8 kg     Intake/Output Summary (Last 24 hours) at 12/09/2018 1322 Last data filed at 12/09/2018 0400 Gross per 24 hour  Intake 2724.44 ml  Output -  Net 2724.44 ml   Physical exam Not in distress HEENT: Dry mucosa, supple neck, icterus + Chest: Clear bilaterally CV: Normal S1 and S2 GI: Soft, nondistended, left upper quadrant and epigastric tenderness to pressure (better from yesterday), bowel sounds present Musculoskeletal: Warm, no edema       Data Review:    CBC Recent Labs  Lab 12/07/18 1951  12/08/18 0200  WBC 8.8 9.1  HGB 17.9* 16.1  HCT 51.7 46.3  PLT 212 183  MCV 96.1 96.7  MCH 33.3 33.6  MCHC 34.6 34.8  RDW 11.7 11.7    Chemistries  Recent Labs  Lab 12/07/18 1951 12/08/18 0200 12/08/18 0832 12/09/18 0533  NA 135 135  --  133*  K 3.2* 4.1  --  3.1*  CL 93* 101  --  99  CO2 26 24  --  22  GLUCOSE 91 148*  --  56*  BUN <5* <5*  --  <5*  CREATININE 0.94 0.80  --  0.87  CALCIUM 9.8 9.0  --  8.1*  MG  --  1.5*  --  1.8  AST 223*  --  164* 123*  ALT 154*  --  122* 92*  ALKPHOS 90  --  78 69  BILITOT 3.0*  --  2.2* 3.0*   ------------------------------------------------------------------------------------------------------------------ Recent Labs    12/07/18 1951  TRIG 80    No results found for: HGBA1C ------------------------------------------------------------------------------------------------------------------ No results for input(s): TSH, T4TOTAL, T3FREE, THYROIDAB in the last 72 hours.  Invalid input(s): FREET3 ------------------------------------------------------------------------------------------------------------------ No results for input(s): VITAMINB12, FOLATE, FERRITIN, TIBC, IRON, RETICCTPCT in the last 72 hours.  Coagulation profile Recent Labs  Lab 12/08/18 0200  INR 1.17    No results for input(s): DDIMER in the last 72 hours.  Cardiac Enzymes Recent Labs  Lab 12/07/18 1951  TROPONINI <0.03   ------------------------------------------------------------------------------------------------------------------ No results found for: BNP  Inpatient Medications  Scheduled Meds: . folic acid  1 mg Oral Daily  . heparin  5,000 Units Subcutaneous Q8H  . multivitamin with minerals  1 tablet Oral Daily  . nicotine  21 mg Transdermal Daily   Continuous Infusions: . dextrose 5 % and 0.9% NaCl 1,000 mL with potassium chloride 40 mEq infusion 125 mL/hr at 12/09/18 0838   PRN Meds:.HYDROmorphone (DILAUDID) injection, ondansetron  (ZOFRAN) IV, oxyCODONE,  senna-docusate, zolpidem  Micro Results No results found for this or any previous visit (from the past 240 hour(s)).  Radiology Reports Mr 3d Recon At Scanner  Result Date: 12/08/2018 CLINICAL DATA:  Pancreatitis.  Nausea and vomiting. EXAM: MRI ABDOMEN WITHOUT AND WITH CONTRAST (INCLUDING MRCP) TECHNIQUE: Multiplanar multisequence MR imaging of the abdomen was performed both before and after the administration of intravenous contrast. Heavily T2-weighted images of the biliary and pancreatic ducts were obtained, and three-dimensional MRCP images were rendered by post processing. CONTRAST:  7.5 mL Gadavist COMPARISON:  Ultrasound 12/07/2018 FINDINGS: Lower chest: Trace pleural effusions. Mild LEFT basilar atelectasis. Hepatobiliary: Several nonenhancing hepatic cysts are present throughout the liver. No intrahepatic biliary duct dilatation. There multiple dependent gallstones within the lumen of the gallbladder measuring from 5-10 mm and numbering approximately 10. No gallbladder wall thickening or gallbladder distension. No pericholecystic fluid. The common bile duct common and hepatic duct are normal caliber. No choledocholithiasis. There is tapering of the common bile duct as it passes through the pancreatic head (image 15/16). The more most distal pancreatic duct regains normal caliber. Pancreas: There is peripancreatic edema and fluid surrounding the head body and tail the pancreas. The pancreatic duct is prominent. No evidence of pancreatic ductal interruption. The head of the pancreas is edematous. The pancreatic parenchyma enhances uniformly. Fluid extends along the anterior margin LEFT pararenal fascia and along the LEFT pericolic gutter. No organized fluid collections. Spleen: Normal spleen. Adrenals/urinary tract: Adrenal glands and kidneys are normal. Stomach/Bowel: Stomach and limited of the small bowel is unremarkable Vascular/Lymphatic: No evidence of vascular  complication associated pancreatitis. Musculoskeletal: No aggressive osseous lesion IMPRESSION: 1. Acute non complicated pancreatitis. No organized fluid collections. No ductal interruption. No pancreatic necrosis. 2. Cholelithiasis without evidence of choledocholithiasis. 3. Tapering of the common bile duct through the pancreatic head may relate to edema of the pancreatic head. A passed common bile duct stone could have a similar effect. No mass lesion identified. 4. Small LEFT effusion and atelectasis. Electronically Signed   By: Suzy Bouchard M.D.   On: 12/08/2018 11:27   US Abdomen Limited  Result Date: 12/07/2018 CLINICAL DATA:  Right upper quadrant and epigastric pain. EXAM: ULTRASOUND ABDOMEN LIMITED RIGHT UPPER QUADRANT COMPARISON:  MRCP 02/11/2018, ultrasound abdomen 02/07/2018 FINDINGS: Gallbladder: Physiologic distention of the gallbladder without wall thickening or pericholecystic fluid. Gallstones are noted near the neck of the gallbladder admixed with biliary sludge. Common bile duct: Diameter: 6.7 mm Liver: Echogenic liver parenchyma consistent with steatosis. No space-occupying mass. Portal vein is patent on color Doppler imaging with normal direction of blood flow towards the liver. IMPRESSION: Echogenic liver parenchyma consistent with steatosis. Uncomplicated cholelithiasis. Electronically Signed   By: Ashley Royalty M.D.   On: 12/07/2018 23:31   [name]  Result Date: 12/08/2018 CLINICAL DATA:  Pancreatitis.  Nausea and vomiting. EXAM: MRI ABDOMEN WITHOUT AND WITH CONTRAST (INCLUDING MRCP) TECHNIQUE: Multiplanar multisequence MR imaging of the abdomen was performed both before and after the administration of intravenous contrast. Heavily T2-weighted images of the biliary and pancreatic ducts were obtained, and three-dimensional MRCP images were rendered by post processing. CONTRAST:  7.5 mL Gadavist COMPARISON:  Ultrasound 12/07/2018 FINDINGS: Lower chest: Trace pleural  effusions. Mild LEFT basilar atelectasis. Hepatobiliary: Several nonenhancing hepatic cysts are present throughout the liver. No intrahepatic biliary duct dilatation. There multiple dependent gallstones within the lumen of the gallbladder measuring from 5-10 mm and numbering approximately 10. No gallbladder wall thickening or gallbladder distension. No pericholecystic  fluid. The common bile duct common and hepatic duct are normal caliber. No choledocholithiasis. There is tapering of the common bile duct as it passes through the pancreatic head (image 15/16). The more most distal pancreatic duct regains normal caliber. Pancreas: There is peripancreatic edema and fluid surrounding the head body and tail the pancreas. The pancreatic duct is prominent. No evidence of pancreatic ductal interruption. The head of the pancreas is edematous. The pancreatic parenchyma enhances uniformly. Fluid extends along the anterior margin LEFT pararenal fascia and along the LEFT pericolic gutter. No organized fluid collections. Spleen: Normal spleen. Adrenals/urinary tract: Adrenal glands and kidneys are normal. Stomach/Bowel: Stomach and limited of the small bowel is unremarkable Vascular/Lymphatic: No evidence of vascular complication associated pancreatitis. Musculoskeletal: No aggressive osseous lesion IMPRESSION: 1. Acute non complicated pancreatitis. No organized fluid collections. No ductal interruption. No pancreatic necrosis. 2. Cholelithiasis without evidence of choledocholithiasis. 3. Tapering of the common bile duct through the pancreatic head may relate to edema of the pancreatic head. A passed common bile duct stone could have a similar effect. No mass lesion identified. 4. Small LEFT effusion and atelectasis. Electronically Signed   By: Suzy Bouchard M.D.   On: 12/08/2018 11:27    Time Spent in minutes 35   Dalphine Cowie M.D on 12/09/2018 at 1:22 PM  Between 7am to 7pm - Pager - [phone]  After 7pm go to  [email or web address] - password [facility]  [facility] -  Office  [phone]

## 2018-12-10 DIAGNOSIS — K8013 Calculus of gallbladder with acute and chronic cholecystitis with obstruction: Secondary | ICD-10-CM

## 2018-12-10 LAB — COMPREHENSIVE METABOLIC PANEL
ALT: 85 U/L — ABNORMAL HIGH (ref 0–44)
AST: 121 U/L — ABNORMAL HIGH (ref 15–41)
Albumin: 2.7 g/dL — ABNORMAL LOW (ref 3.5–5.0)
Alkaline Phosphatase: 69 U/L (ref 38–126)
Anion gap: 8 (ref 5–15)
BUN: <5 mg/dL — ABNORMAL LOW (ref 6–20)
CO2: 24 mmol/L (ref 22–32)
Calcium: 8.1 mg/dL — ABNORMAL LOW (ref 8.9–10.3)
Chloride: 98 mmol/L (ref 98–111)
Creatinine, Ser: 0.79 mg/dL (ref 0.61–1.24)
GFR calc Af Amer: >60 mL/min (ref >60)
GFR calc non Af Amer: >60 mL/min (ref >60)
Glucose, Bld: 133 mg/dL — ABNORMAL HIGH (ref 70–99)
Potassium: 3.6 mmol/L (ref 3.5–5.1)
Sodium: 130 mmol/L — ABNORMAL LOW (ref 135–145)
Total Bilirubin: 3.1 mg/dL — ABNORMAL HIGH (ref 0.3–1.2)
Total Protein: 6.4 g/dL — ABNORMAL LOW (ref 6.5–8.1)

## 2018-12-10 LAB — LIPASE, BLOOD: Lipase: 116 U/L — ABNORMAL HIGH (ref 11–51)

## 2018-12-10 LAB — SURGICAL PCR SCREEN
MRSA, PCR: NEGATIVE
Staphylococcus aureus: POSITIVE — AB

## 2018-12-10 LAB — CBC
HCT: 41.3 % (ref 39.0–52.0)
Hemoglobin: 14.4 g/dL (ref 13.0–17.0)
MCH: 33.3 pg (ref 26.0–34.0)
MCHC: 34.9 g/dL (ref 30.0–36.0)
MCV: 95.4 fL (ref 80.0–100.0)
Platelets: 171 10*3/uL (ref 150–400)
RBC: 4.33 MIL/uL (ref 4.22–5.81)
RDW: 11.3 % — ABNORMAL LOW (ref 11.5–15.5)
WBC: 12.9 10*3/uL — ABNORMAL HIGH (ref 4.0–10.5)
nRBC: 0 % (ref 0.0–0.2)

## 2018-12-10 MED ORDER — IBUPROFEN 200 MG PO TABS
400.0000 mg | ORAL_TABLET | ORAL | Status: AC
  Administered 2018-12-10: 400 mg via ORAL
  Filled 2018-12-10: qty 2

## 2018-12-10 MED ORDER — KCL IN DEXTROSE-NACL 40-5-0.9 MEQ/L-%-% IV SOLN
INTRAVENOUS | Status: DC
  Administered 2018-12-10 – 2018-12-11 (×2): via INTRAVENOUS
  Filled 2018-12-10 (×4): qty 1000
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
## 2018-12-10 NOTE — Progress Notes (Signed)
PROGRESS NOTE                                                                                                                                                                                                             Patient Demographics:    Wesley [name], is a 53 y.o. male, DOB - 30-Aug-1966, HKV:[id number]  Admit date - 12/07/2018   Admitting Physician Ivor Costa, MD  Outpatient Primary MD for the patient is [name], NP  LOS - 2  Outpatient Specialists:None  Chief Complaint  Patient presents with  . Abdominal Pain       Brief Narrative 53 year old male with alcohol abuse (currently in remission for almost 1 year since last hospitalization), alcoholic pancreatitis, tobacco abuse, cholecystitis, pneumothorax presented to the ED with nausea, vomiting, left upper quadrant abdominal pain radiating to the back for almost 3 days. Patient was hospitalized back in March 2019 with acute pancreatitis possibly alcohol-related versus gallstone.  MRCP done at that time negative for CBD dilation or choledocholithiasis and was recommended for outpatient elective cholecystectomy.  However due to insurance issues he did not follow-up with surgery. Patient found to have elevated lipase greater than 1000 with AST of 223, ALT of 154, total bili of 3 and normal alkaline phosphatase.  Ultrasound abdomen showed fatty liver and uncomplicated cholelithiasis. Patient admitted for recurrent pancreatitis.  Subjective:   Abdominal pain better today no nausea or vomiting and has not required pain medicine since this morning.  Tolerating clears  Assessment  & Plan :    Principal Problem:   Pancreatitis, recurrent  Patient has quit alcohol for almost 10 months.  Suspected gallstone pancreatitis.  MRCP with findings of pancreatitis and cholelithiasis. LFTs slowly trending down.  Lipase near normal. Continue IV hydration.  Tolerating clear  liquid.  Surgery following and plan on possible laparoscopic cholecystectomy with IOC tomorrow.  N.p.o. after midnight.  Active Problems: Tobacco use Counseled strongly on cessation.  Continue nicotine patch.    Hypokalemia/hypomagnesemia Replenished with fluids.     Code Status : Full code  Family Communication  : None at bedside  Disposition Plan  : Home pending clinical improvement, laparoscopic cholecystectomy  Barriers For Discharge : Active symptoms  Consults  : Surgery  Procedures  : Ultrasound abdomen, MRCP  DVT Prophylaxis  :  Lovenox -   Lab Results  Component Value Date   PLT 171 12/10/2018    Antibiotics  :    Anti-infectives (From admission, onward)   None        Objective:   Vitals:   12/10/18 0000 12/10/18 0445 12/10/18 0739 12/10/18 1221  BP: 131/87 127/78 (!) 124/94 130/84  Pulse: 98 (!) 101 97 (!) 108  Resp: _0 Temp: 99.9 F (37.7 C) 98.9 F (37.2 C) 99.8 F (37.7 C) 98.7 F (37.1 C)  TempSrc: Oral Oral Oral Oral  SpO2: 93% 93% 91% 96%    Wt Readings from Last 3 Encounters:  02/07/18 56.5 kg  05/31/15 61.2 kg  11/19/14 65.8 kg     Intake/Output Summary (Last 24 hours) at 12/10/2018 1322 Last data filed at 12/10/2018 0600 Gross per 24 hour  Intake 650 ml  Output 550 ml  Net 100 ml   Physical exam Not in distress HEENT: Moist mucosa, supple neck, icterus + Chest: Clear bilaterally CVS: Normal S1 and S2 GI: Soft, nondistended, nontender, bowel sounds present Musculoskeletal: Warm, no edema       Data Review:    CBC Recent Labs  Lab 12/07/18 1951 12/08/18 0200 12/10/18 0655  WBC 8.8 9.1 12.9*  HGB 17.9* 16.1 14.4  HCT 51.7 46.3 41.3  PLT 212 183 171  MCV 96.1 96.7 95.4  MCH 33.3 33.6 33.3  MCHC 34.6 34.8 34.9  RDW 11.7 11.7 11.3*    Chemistries  Recent Labs  Lab 12/07/18 1951 12/08/18 0200 12/08/18 0832 12/09/18 0533 12/10/18 0655  NA 135 135  --  133* 130*  K 3.2* 4.1  --  3.1* 3.6  CL  93* 101  --  99 98  CO2 26 24  --  22 24  GLUCOSE 91 148*  --  56* 133*  BUN <5* <5*  --  <5* <5*  CREATININE 0.94 0.80  --  0.87 0.79  CALCIUM 9.8 9.0  --  8.1* 8.1*  MG  --  1.5*  --  1.8  --   AST 223*  --  164* 123* 121*  ALT 154*  --  122* 92* 85*  ALKPHOS 90  --  78 69 69  BILITOT 3.0*  --  2.2* 3.0* 3.1*   ------------------------------------------------------------------------------------------------------------------ Recent Labs    12/07/18 1951  TRIG 80    No results found for: HGBA1C ------------------------------------------------------------------------------------------------------------------ No results for input(s): TSH, T4TOTAL, T3FREE, THYROIDAB in the last 72 hours.  Invalid input(s): FREET3 ------------------------------------------------------------------------------------------------------------------ No results for input(s): VITAMINB12, FOLATE, FERRITIN, TIBC, IRON, RETICCTPCT in the last 72 hours.  Coagulation profile Recent Labs  Lab 12/08/18 0200  INR 1.17    No results for input(s): DDIMER in the last 72 hours.  Cardiac Enzymes Recent Labs  Lab 12/07/18 1951  TROPONINI <0.03   ------------------------------------------------------------------------------------------------------------------ No results found for: BNP  Inpatient Medications  Scheduled Meds: . folic acid  1 mg Oral Daily  . heparin  5,000 Units Subcutaneous Q8H  . multivitamin with minerals  1 tablet Oral Daily  . nicotine  21 mg Transdermal Daily   Continuous Infusions: . dextrose 5 % and 0.9 % NaCl with KCl 40 mEq/L 125 mL/hr at 12/10/18 0413   PRN Meds:.HYDROmorphone (DILAUDID) injection, ondansetron (ZOFRAN) IV, oxyCODONE, senna-docusate, zolpidem  Micro Results No results found for this or any previous visit (from the past 240 hour(s)).  Radiology Reports Mr 3d Recon At Scanner  Result Date: 12/08/2018 CLINICAL DATA:  Pancreatitis.  Nausea and vomiting. EXAM:  MRI  ABDOMEN WITHOUT AND WITH CONTRAST (INCLUDING MRCP) TECHNIQUE: Multiplanar multisequence MR imaging of the abdomen was performed both before and after the administration of intravenous contrast. Heavily T2-weighted images of the biliary and pancreatic ducts were obtained, and three-dimensional MRCP images were rendered by post processing. CONTRAST:  7.5 mL Gadavist COMPARISON:  Ultrasound 12/07/2018 FINDINGS: Lower chest: Trace pleural effusions. Mild LEFT basilar atelectasis. Hepatobiliary: Several nonenhancing hepatic cysts are present throughout the liver. No intrahepatic biliary duct dilatation. There multiple dependent gallstones within the lumen of the gallbladder measuring from 5-10 mm and numbering approximately 10. No gallbladder wall thickening or gallbladder distension. No pericholecystic fluid. The common bile duct common and hepatic duct are normal caliber. No choledocholithiasis. There is tapering of the common bile duct as it passes through the pancreatic head (image 15/16). The more most distal pancreatic duct regains normal caliber. Pancreas: There is peripancreatic edema and fluid surrounding the head body and tail the pancreas. The pancreatic duct is prominent. No evidence of pancreatic ductal interruption. The head of the pancreas is edematous. The pancreatic parenchyma enhances uniformly. Fluid extends along the anterior margin LEFT pararenal fascia and along the LEFT pericolic gutter. No organized fluid collections. Spleen: Normal spleen. Adrenals/urinary tract: Adrenal glands and kidneys are normal. Stomach/Bowel: Stomach and limited of the small bowel is unremarkable Vascular/Lymphatic: No evidence of vascular complication associated pancreatitis. Musculoskeletal: No aggressive osseous lesion IMPRESSION: 1. Acute non complicated pancreatitis. No organized fluid collections. No ductal interruption. No pancreatic necrosis. 2. Cholelithiasis without evidence of choledocholithiasis. 3.  Tapering of the common bile duct through the pancreatic head may relate to edema of the pancreatic head. A passed common bile duct stone could have a similar effect. No mass lesion identified. 4. Small LEFT effusion and atelectasis. Electronically Signed   By: Suzy Bouchard M.D.   On: 12/08/2018 11:27   US Abdomen Limited  Result Date: 12/07/2018 CLINICAL DATA:  Right upper quadrant and epigastric pain. EXAM: ULTRASOUND ABDOMEN LIMITED RIGHT UPPER QUADRANT COMPARISON:  MRCP 02/11/2018, ultrasound abdomen 02/07/2018 FINDINGS: Gallbladder: Physiologic distention of the gallbladder without wall thickening or pericholecystic fluid. Gallstones are noted near the neck of the gallbladder admixed with biliary sludge. Common bile duct: Diameter: 6.7 mm Liver: Echogenic liver parenchyma consistent with steatosis. No space-occupying mass. Portal vein is patent on color Doppler imaging with normal direction of blood flow towards the liver. IMPRESSION: Echogenic liver parenchyma consistent with steatosis. Uncomplicated cholelithiasis. Electronically Signed   By: Ashley Royalty M.D.   On: 12/07/2018 23:31   [name]  Result Date: 12/08/2018 CLINICAL DATA:  Pancreatitis.  Nausea and vomiting. EXAM: MRI ABDOMEN WITHOUT AND WITH CONTRAST (INCLUDING MRCP) TECHNIQUE: Multiplanar multisequence MR imaging of the abdomen was performed both before and after the administration of intravenous contrast. Heavily T2-weighted images of the biliary and pancreatic ducts were obtained, and three-dimensional MRCP images were rendered by post processing. CONTRAST:  7.5 mL Gadavist COMPARISON:  Ultrasound 12/07/2018 FINDINGS: Lower chest: Trace pleural effusions. Mild LEFT basilar atelectasis. Hepatobiliary: Several nonenhancing hepatic cysts are present throughout the liver. No intrahepatic biliary duct dilatation. There multiple dependent gallstones within the lumen of the gallbladder measuring from 5-10 mm and numbering  approximately 10. No gallbladder wall thickening or gallbladder distension. No pericholecystic fluid. The common bile duct common and hepatic duct are normal caliber. No choledocholithiasis. There is tapering of the common bile duct as it passes through the pancreatic head (image 15/16). The more most distal pancreatic duct regains normal caliber. Pancreas:  There is peripancreatic edema and fluid surrounding the head body and tail the pancreas. The pancreatic duct is prominent. No evidence of pancreatic ductal interruption. The head of the pancreas is edematous. The pancreatic parenchyma enhances uniformly. Fluid extends along the anterior margin LEFT pararenal fascia and along the LEFT pericolic gutter. No organized fluid collections. Spleen: Normal spleen. Adrenals/urinary tract: Adrenal glands and kidneys are normal. Stomach/Bowel: Stomach and limited of the small bowel is unremarkable Vascular/Lymphatic: No evidence of vascular complication associated pancreatitis. Musculoskeletal: No aggressive osseous lesion IMPRESSION: 1. Acute non complicated pancreatitis. No organized fluid collections. No ductal interruption. No pancreatic necrosis. 2. Cholelithiasis without evidence of choledocholithiasis. 3. Tapering of the common bile duct through the pancreatic head may relate to edema of the pancreatic head. A passed common bile duct stone could have a similar effect. No mass lesion identified. 4. Small LEFT effusion and atelectasis. Electronically Signed   By: Suzy Bouchard M.D.   On: 12/08/2018 11:27    Time Spent in minutes 25   Orlie Cundari M.D on 12/10/2018 at 1:22 PM  Between 7am to 7pm - Pager - [phone]  After 7pm go to [email or web address] - password [facility]  [facility] -  Office  [phone]

## 2018-12-11 ENCOUNTER — Encounter (HOSPITAL_COMMUNITY)
Admission: EM | Disposition: A | Discharge status: Home / Self Care | Payer: Self-pay | Source: Home / Self Care | Attending: Internal Medicine

## 2018-12-11 LAB — COMPREHENSIVE METABOLIC PANEL
ALT: 89 U/L — ABNORMAL HIGH (ref 0–44)
AST: 130 U/L — ABNORMAL HIGH (ref 15–41)
Albumin: 2.7 g/dL — ABNORMAL LOW (ref 3.5–5.0)
Alkaline Phosphatase: 74 U/L (ref 38–126)
Anion gap: 9 (ref 5–15)
BUN: <5 mg/dL — ABNORMAL LOW (ref 6–20)
CO2: 26 mmol/L (ref 22–32)
Calcium: 9 mg/dL (ref 8.9–10.3)
Chloride: 97 mmol/L — ABNORMAL LOW (ref 98–111)
Creatinine, Ser: 0.81 mg/dL (ref 0.61–1.24)
GFR calc Af Amer: >60 mL/min (ref >60)
GFR calc non Af Amer: >60 mL/min (ref >60)
Glucose, Bld: 108 mg/dL — ABNORMAL HIGH (ref 70–99)
Potassium: 3.6 mmol/L (ref 3.5–5.1)
Sodium: 132 mmol/L — ABNORMAL LOW (ref 135–145)
Total Bilirubin: 2.9 mg/dL — ABNORMAL HIGH (ref 0.3–1.2)
Total Protein: 6.6 g/dL (ref 6.5–8.1)

## 2018-12-11 LAB — CBC
HCT: 41.9 % (ref 39.0–52.0)
Hemoglobin: 14.5 g/dL (ref 13.0–17.0)
MCH: 33.2 pg (ref 26.0–34.0)
MCHC: 34.6 g/dL (ref 30.0–36.0)
MCV: 95.9 fL (ref 80.0–100.0)
Platelets: 217 10*3/uL (ref 150–400)
RBC: 4.37 MIL/uL (ref 4.22–5.81)
RDW: 11.3 % — ABNORMAL LOW (ref 11.5–15.5)
WBC: 10.6 10*3/uL — ABNORMAL HIGH (ref 4.0–10.5)
nRBC: 0 % (ref 0.0–0.2)

## 2018-12-11 LAB — LIPASE, BLOOD: Lipase: 119 U/L — ABNORMAL HIGH (ref 11–51)

## 2018-12-11 SURGERY — LAPAROSCOPIC CHOLECYSTECTOMY WITH INTRAOPERATIVE CHOLANGIOGRAM
Anesthesia: General

## 2018-12-11 MED ORDER — MUPIROCIN 2 % EX OINT
1.0000 "application " | TOPICAL_OINTMENT | Freq: Two times a day (BID) | CUTANEOUS | Status: DC
  Administered 2018-12-11 – 2018-12-14 (×7): 1 via NASAL
  Filled 2018-12-11 (×2): qty 22

## 2018-12-11 MED ORDER — CHLORHEXIDINE GLUCONATE CLOTH 2 % EX PADS: 6.0000 | MEDICATED_PAD | Freq: Every day | CUTANEOUS | Status: DC

## 2018-12-11 MED ORDER — SODIUM CHLORIDE 0.9 % IV SOLN
INTRAVENOUS | Status: AC
  Administered 2018-12-11 – 2018-12-12 (×2): via INTRAVENOUS
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
## 2018-12-11 NOTE — Progress Notes (Signed)
PROGRESS NOTE                                                                                                                                                                                                             Patient Demographics:    Wesley [name], is a 53 y.o. male, DOB - [birth year], WGN:[id number]  Admit date - 12/07/2018   Admitting Physician Ivor Costa, MD  Outpatient Primary MD for the patient is [name], NP  LOS - 3  Outpatient Specialists:None  Chief Complaint  Patient presents with  . Abdominal Pain       Brief Narrative 53 year old male with alcohol abuse (currently in remission for almost 1 year since last hospitalization), alcoholic pancreatitis, tobacco abuse, cholecystitis, pneumothorax presented to the ED with nausea, vomiting, left upper quadrant abdominal pain radiating to the back for almost 3 days. Patient was hospitalized back in March 2019 with acute pancreatitis possibly alcohol-related versus gallstone.  MRCP done at that time negative for CBD dilation or choledocholithiasis and was recommended for outpatient elective cholecystectomy.  However due to insurance issues he did not follow-up with surgery. Patient found to have elevated lipase greater than 1000 with AST of 223, ALT of 154, total bili of 3 and normal alkaline phosphatase.  Ultrasound abdomen showed fatty liver and uncomplicated cholelithiasis. Patient admitted for recurrent pancreatitis.  Subjective:   Patient was febrile to 100.60 Fahrenheit last evening.  Abdominal pain continues to improve and tolerating clears.  Assessment  & Plan :    Principal Problem:   Pancreatitis, recurrent  Patient has quit alcohol for almost 10 months.  Suspected gallstone pancreatitis.  MRCP with findings of pancreatitis and cholelithiasis. LFTs unchanged from yesterday, lipase mildly elevated.  Surgery plans to operate (lap chole with IOC)  possibly tomorrow versus Monday.  Continue IV fluids and clears for now.    Active Problems: Tobacco use Counseled strongly on cessation.  Continue nicotine patch.    Hypokalemia/hypomagnesemia Replenished with fluids.     Code Status : Full code  Family Communication  : None at bedside  Disposition Plan  : Home pending clinical improvement, laparoscopic cholecystectomy  Barriers For Discharge : Active symptoms  Consults  : Surgery  Procedures  : Ultrasound abdomen, MRCP  DVT Prophylaxis  :  Lovenox -   Lab Results  Component Value Date  PLT 217 12/11/2018    Antibiotics  :    Anti-infectives (From admission, onward)   None        Objective:   Vitals:   12/10/18 1947 12/10/18 2323 12/11/18 0401 12/11/18 0804  BP: 134/89 133/90 (!) 134/92 133/89  Pulse: 97 93 87 98  Resp: _0 Temp: 98.8 F (37.1 C) 98.5 F (36.9 C) 98.6 F (37 C) 99.7 F (37.6 C)  TempSrc: Oral Oral Oral Oral  SpO2: 96% 92% 93% 93%    Wt Readings from Last 3 Encounters:  02/07/18 56.5 kg  05/31/15 61.2 kg  11/19/14 65.8 kg     Intake/Output Summary (Last 24 hours) at 12/11/2018 1144 Last data filed at 12/11/2018 0655 Gross per 24 hour  Intake 1500 ml  Output -  Net 1500 ml   Physical exam Not in distress HEENT: Moist mucosa, supple neck Chest: Clear CVS: Normal S1-S2 GI: Soft, nondistended, nontender, bowel sounds present Musculoskeletal: Warm, no edema       Data Review:    CBC Recent Labs  Lab 12/07/18 1951 12/08/18 0200 12/10/18 0655 12/11/18 0450  WBC 8.8 9.1 12.9* 10.6*  HGB 17.9* 16.1 14.4 14.5  HCT 51.7 46.3 41.3 41.9  PLT 212 183 171 217  MCV 96.1 96.7 95.4 95.9  MCH 33.3 33.6 33.3 33.2  MCHC 34.6 34.8 34.9 34.6  RDW 11.7 11.7 11.3* 11.3*    Chemistries  Recent Labs  Lab 12/07/18 1951 12/08/18 0200 12/08/18 0832 12/09/18 0533 12/10/18 0655 12/11/18 0450  NA 135 135  --  133* 130* 132*  K 3.2* 4.1  --  3.1* 3.6 3.6  CL 93*  101  --  99 98 97*  CO2 26 24  --  _1 GLUCOSE 91 148*  --  56* 133* 108*  BUN <5* <5*  --  <5* <5* <5*  CREATININE 0.94 0.80  --  0.87 0.79 0.81  CALCIUM 9.8 9.0  --  8.1* 8.1* 9.0  MG  --  1.5*  --  1.8  --   --   AST 223*  --  164* 123* 121* 130*  ALT 154*  --  122* 92* 85* 89*  ALKPHOS 90  --  78 69 69 74  BILITOT 3.0*  --  2.2* 3.0* 3.1* 2.9*   ------------------------------------------------------------------------------------------------------------------ No results for input(s): CHOL, HDL, LDLCALC, TRIG, CHOLHDL, LDLDIRECT in the last 72 hours.  No results found for: HGBA1C ------------------------------------------------------------------------------------------------------------------ No results for input(s): TSH, T4TOTAL, T3FREE, THYROIDAB in the last 72 hours.  Invalid input(s): FREET3 ------------------------------------------------------------------------------------------------------------------ No results for input(s): VITAMINB12, FOLATE, FERRITIN, TIBC, IRON, RETICCTPCT in the last 72 hours.  Coagulation profile Recent Labs  Lab 12/08/18 0200  INR 1.17    No results for input(s): DDIMER in the last 72 hours.  Cardiac Enzymes Recent Labs  Lab 12/07/18 1951  TROPONINI <0.03   ------------------------------------------------------------------------------------------------------------------ No results found for: BNP  Inpatient Medications  Scheduled Meds: . Chlorhexidine Gluconate Cloth  6 each Topical Daily  . folic acid  1 mg Oral Daily  . heparin  5,000 Units Subcutaneous Q8H  . multivitamin with minerals  1 tablet Oral Daily  . mupirocin ointment  1 application Nasal BID  . nicotine  21 mg Transdermal Daily   Continuous Infusions: . sodium chloride     PRN Meds:.HYDROmorphone (DILAUDID) injection, ondansetron (ZOFRAN) IV, oxyCODONE, senna-docusate, zolpidem  Micro Results Recent Results (from the past 240 hour(s))  Surgical pcr screen  Status: Abnormal   Collection Time: 12/10/18  8:17 PM  Result Value Ref Range Status   MRSA, PCR NEGATIVE NEGATIVE Final   Staphylococcus aureus POSITIVE (A) NEGATIVE Final    Comment: (NOTE) The Xpert SA Assay (FDA approved for NASAL specimens in patients 35 years of age and older), is one component of a comprehensive surveillance program. It is not intended to diagnose infection nor to guide or monitor treatment. Performed at [facility], [place] [address] [place], [place] [address]     Radiology Reports Mr 3d Recon At Scanner  Result Date: 12/08/2018 CLINICAL DATA:  Pancreatitis.  Nausea and vomiting. EXAM: MRI ABDOMEN WITHOUT AND WITH CONTRAST (INCLUDING MRCP) TECHNIQUE: Multiplanar multisequence MR imaging of the abdomen was performed both before and after the administration of intravenous contrast. Heavily T2-weighted images of the biliary and pancreatic ducts were obtained, and three-dimensional MRCP images were rendered by post processing. CONTRAST:  7.5 mL Gadavist COMPARISON:  Ultrasound 12/07/2018 FINDINGS: Lower chest: Trace pleural effusions. Mild LEFT basilar atelectasis. Hepatobiliary: Several nonenhancing hepatic cysts are present throughout the liver. No intrahepatic biliary duct dilatation. There multiple dependent gallstones within the lumen of the gallbladder measuring from 5-10 mm and numbering approximately 10. No gallbladder wall thickening or gallbladder distension. No pericholecystic fluid. The common bile duct common and hepatic duct are normal caliber. No choledocholithiasis. There is tapering of the common bile duct as it passes through the pancreatic head (image 15/16). The more most distal pancreatic duct regains normal caliber. Pancreas: There is peripancreatic edema and fluid surrounding the head body and tail the pancreas. The pancreatic duct is prominent. No evidence of pancreatic ductal interruption. The head of the pancreas is edematous. The pancreatic  parenchyma enhances uniformly. Fluid extends along the anterior margin LEFT pararenal fascia and along the LEFT pericolic gutter. No organized fluid collections. Spleen: Normal spleen. Adrenals/urinary tract: Adrenal glands and kidneys are normal. Stomach/Bowel: Stomach and limited of the small bowel is unremarkable Vascular/Lymphatic: No evidence of vascular complication associated pancreatitis. Musculoskeletal: No aggressive osseous lesion IMPRESSION: 1. Acute non complicated pancreatitis. No organized fluid collections. No ductal interruption. No pancreatic necrosis. 2. Cholelithiasis without evidence of choledocholithiasis. 3. Tapering of the common bile duct through the pancreatic head may relate to edema of the pancreatic head. A passed common bile duct stone could have a similar effect. No mass lesion identified. 4. Small LEFT effusion and atelectasis. Electronically Signed   By: Suzy Bouchard M.D.   On: 12/08/2018 11:27   US Abdomen Limited  Result Date: 12/07/2018 CLINICAL DATA:  Right upper quadrant and epigastric pain. EXAM: ULTRASOUND ABDOMEN LIMITED RIGHT UPPER QUADRANT COMPARISON:  MRCP 02/11/2018, ultrasound abdomen 02/07/2018 FINDINGS: Gallbladder: Physiologic distention of the gallbladder without wall thickening or pericholecystic fluid. Gallstones are noted near the neck of the gallbladder admixed with biliary sludge. Common bile duct: Diameter: 6.7 mm Liver: Echogenic liver parenchyma consistent with steatosis. No space-occupying mass. Portal vein is patent on color Doppler imaging with normal direction of blood flow towards the liver. IMPRESSION: Echogenic liver parenchyma consistent with steatosis. Uncomplicated cholelithiasis. Electronically Signed   By: Ashley Royalty M.D.   On: 12/07/2018 23:31   [name]  Result Date: 12/08/2018 CLINICAL DATA:  Pancreatitis.  Nausea and vomiting. EXAM: MRI ABDOMEN WITHOUT AND WITH CONTRAST (INCLUDING MRCP) TECHNIQUE: Multiplanar  multisequence MR imaging of the abdomen was performed both before and after the administration of intravenous contrast. Heavily T2-weighted images of the biliary and pancreatic ducts were  obtained, and three-dimensional MRCP images were rendered by post processing. CONTRAST:  7.5 mL Gadavist COMPARISON:  Ultrasound 12/07/2018 FINDINGS: Lower chest: Trace pleural effusions. Mild LEFT basilar atelectasis. Hepatobiliary: Several nonenhancing hepatic cysts are present throughout the liver. No intrahepatic biliary duct dilatation. There multiple dependent gallstones within the lumen of the gallbladder measuring from 5-10 mm and numbering approximately 10. No gallbladder wall thickening or gallbladder distension. No pericholecystic fluid. The common bile duct common and hepatic duct are normal caliber. No choledocholithiasis. There is tapering of the common bile duct as it passes through the pancreatic head (image 15/16). The more most distal pancreatic duct regains normal caliber. Pancreas: There is peripancreatic edema and fluid surrounding the head body and tail the pancreas. The pancreatic duct is prominent. No evidence of pancreatic ductal interruption. The head of the pancreas is edematous. The pancreatic parenchyma enhances uniformly. Fluid extends along the anterior margin LEFT pararenal fascia and along the LEFT pericolic gutter. No organized fluid collections. Spleen: Normal spleen. Adrenals/urinary tract: Adrenal glands and kidneys are normal. Stomach/Bowel: Stomach and limited of the small bowel is unremarkable Vascular/Lymphatic: No evidence of vascular complication associated pancreatitis. Musculoskeletal: No aggressive osseous lesion IMPRESSION: 1. Acute non complicated pancreatitis. No organized fluid collections. No ductal interruption. No pancreatic necrosis. 2. Cholelithiasis without evidence of choledocholithiasis. 3. Tapering of the common bile duct through the pancreatic head may relate to edema of  the pancreatic head. A passed common bile duct stone could have a similar effect. No mass lesion identified. 4. Small LEFT effusion and atelectasis. Electronically Signed   By: Suzy Bouchard M.D.   On: 12/08/2018 11:27    Time Spent in minutes 25   Matthewjames Petrasek M.D on 12/11/2018 at 11:44 AM  Between 7am to 7pm - Pager - [phone]  After 7pm go to [email or web address] - password [facility]  [facility] -  Office  [phone]

## 2018-12-11 NOTE — Plan of Care (Signed)
  Problem: Education: Goal: Knowledge of General Education information will improve Description Including pain rating scale, medication(s)/side effects and non-pharmacologic comfort measures Outcome: Progressing Note:  POC reviewed with pt.; aware of surgery for 12/11/18 and time; informed pt. of being NPO after mn.

## 2018-12-12 ENCOUNTER — Inpatient Hospital Stay (HOSPITAL_COMMUNITY): Payer: Self-pay

## 2018-12-12 LAB — COMPREHENSIVE METABOLIC PANEL
ALT: 78 U/L — ABNORMAL HIGH (ref 0–44)
AST: 93 U/L — ABNORMAL HIGH (ref 15–41)
Albumin: 2.7 g/dL — ABNORMAL LOW (ref 3.5–5.0)
Alkaline Phosphatase: 70 U/L (ref 38–126)
Anion gap: 10 (ref 5–15)
BUN: <5 mg/dL — ABNORMAL LOW (ref 6–20)
CO2: 22 mmol/L (ref 22–32)
Calcium: 8.5 mg/dL — ABNORMAL LOW (ref 8.9–10.3)
Chloride: 98 mmol/L (ref 98–111)
Creatinine, Ser: 0.78 mg/dL (ref 0.61–1.24)
GFR calc Af Amer: >60 mL/min (ref >60)
GFR calc non Af Amer: >60 mL/min (ref >60)
Glucose, Bld: 91 mg/dL (ref 70–99)
Potassium: 3.3 mmol/L — ABNORMAL LOW (ref 3.5–5.1)
Sodium: 130 mmol/L — ABNORMAL LOW (ref 135–145)
Total Bilirubin: 2.2 mg/dL — ABNORMAL HIGH (ref 0.3–1.2)
Total Protein: 6.7 g/dL (ref 6.5–8.1)

## 2018-12-12 LAB — CBC
HCT: 39.8 % (ref 39.0–52.0)
Hemoglobin: 14.2 g/dL (ref 13.0–17.0)
MCH: 34.1 pg — ABNORMAL HIGH (ref 26.0–34.0)
MCHC: 35.7 g/dL (ref 30.0–36.0)
MCV: 95.7 fL (ref 80.0–100.0)
Platelets: 292 10*3/uL (ref 150–400)
RBC: 4.16 MIL/uL — ABNORMAL LOW (ref 4.22–5.81)
RDW: 11.5 % (ref 11.5–15.5)
WBC: 9.9 10*3/uL (ref 4.0–10.5)
nRBC: 0 % (ref 0.0–0.2)

## 2018-12-12 LAB — LIPASE, BLOOD: Lipase: 183 U/L — ABNORMAL HIGH (ref 11–51)

## 2018-12-12 MED ORDER — POTASSIUM CHLORIDE CRYS ER 20 MEQ PO TBCR
40.0000 meq | EXTENDED_RELEASE_TABLET | Freq: Once | ORAL | Status: AC
  Administered 2018-12-12: 40 meq via ORAL
  Filled 2018-12-12: qty 2

## 2018-12-12 MED ORDER — IOHEXOL 300 MG/ML  SOLN
100.0000 mL | Freq: Once | INTRAMUSCULAR | Status: AC | PRN
  Administered 2018-12-12: 100 mL via INTRAVENOUS

## 2018-12-12 NOTE — Progress Notes (Signed)
PROGRESS NOTE                                                                                                                                                                                                             Patient Demographics:    Wesley [name], is a 53 y.o. male, DOB - 05/31/1966, VOJ:[id number]  Admit date - 12/07/2018   Admitting Physician Ivor Costa, MD  Outpatient Primary MD for the patient is [name], NP  LOS - 4  Outpatient Specialists:None  Chief Complaint  Patient presents with  . Abdominal Pain       Brief Narrative 53 year old male with alcohol abuse (currently in remission for almost 1 year since last hospitalization), alcoholic pancreatitis, tobacco abuse, cholecystitis, pneumothorax presented to the ED with nausea, vomiting, left upper quadrant abdominal pain radiating to the back for almost 3 days. Patient was hospitalized back in March 2019 with acute pancreatitis possibly alcohol-related versus gallstone.  MRCP done at that time negative for CBD dilation or choledocholithiasis and was recommended for outpatient elective cholecystectomy.  However due to insurance issues he did not follow-up with surgery. Patient found to have elevated lipase greater than 1000 with AST of 223, ALT of 154, total bili of 3 and normal alkaline phosphatase.  Ultrasound abdomen showed fatty liver and uncomplicated cholelithiasis. Patient admitted for recurrent pancreatitis.  Subjective:   Afebrile.  Reports some left upper quadrant abdominal pain today.  No nausea or vomiting.  Assessment  & Plan :    Principal Problem:   Pancreatitis, recurrent  Patient has quit alcohol for almost 10 months.  Suspected gallstone pancreatitis.  MRCP with findings of pancreatitis and cholelithiasis. LFTs improving.  Still has some elevated lipase (126) but clinically much better.  Surgery plan on getting CT scan to evaluate the  pancreas before proceeding with surgery tomorrow. Continue clears for now and n.p.o. after midnight.   Active Problems: Tobacco use Counseled strongly on cessation.  Continue nicotine patch.    Hypokalemia/hypomagnesemia Replenished with fluids.     Code Status : Full code  Family Communication  : None at bedside  Disposition Plan  : Home pending clinical improvement, laparoscopic cholecystectomy  Barriers For Discharge : Active symptoms  Consults  : Surgery  Procedures  : Ultrasound abdomen, MRCP  DVT Prophylaxis  :  Lovenox -  Lab Results  Component Value Date   PLT 292 12/12/2018    Antibiotics  :    Anti-infectives (From admission, onward)   None        Objective:   Vitals:   12/11/18 2351 12/12/18 0346 12/12/18 0833 12/12/18 1101  BP: 129/82 (!) 122/91 133/87 (!) 128/97  Pulse: 91 97 85 94  Resp: _0 Temp: 98.8 F (37.1 C) 98.7 F (37.1 C) 98.7 F (37.1 C) 98.2 F (36.8 C)  TempSrc: Oral Oral Oral Oral  SpO2: 96% 96% 96% 96%    Wt Readings from Last 3 Encounters:  02/07/18 56.5 kg  05/31/15 61.2 kg  11/19/14 65.8 kg    No intake or output data in the 24 hours ending 12/12/18 1131  Physical exam Not in distress HEENT: Moist mucosa, supple neck Chest: Clear bilaterally CVS: Normal S1-S2, no murmurs GI: Soft, nondistended, mild left upper quadrant tenderness, Bowel sounds present Musculoskeletal: Warm, no edema      Data Review:    CBC Recent Labs  Lab 12/07/18 1951 12/08/18 0200 12/10/18 0655 12/11/18 0450 12/12/18 0556  WBC 8.8 9.1 12.9* 10.6* 9.9  HGB 17.9* 16.1 14.4 14.5 14.2  HCT 51.7 46.3 41.3 41.9 39.8  PLT 212 183 171 217 292  MCV 96.1 96.7 95.4 95.9 95.7  MCH 33.3 33.6 33.3 33.2 34.1*  MCHC 34.6 34.8 34.9 34.6 35.7  RDW 11.7 11.7 11.3* 11.3* 11.5    Chemistries  Recent Labs  Lab 12/08/18 0200 12/08/18 0832 12/09/18 0533 12/10/18 0655 12/11/18 0450 12/12/18 0556  NA 135  --  133* 130* 132*  130*  K 4.1  --  3.1* 3.6 3.6 3.3*  CL 101  --  99 98 97* 98  CO2 24  --  _1 GLUCOSE 148*  --  56* 133* 108* 91  BUN <5*  --  <5* <5* <5* <5*  CREATININE 0.80  --  0.87 0.79 0.81 0.78  CALCIUM 9.0  --  8.1* 8.1* 9.0 8.5*  MG 1.5*  --  1.8  --   --   --   AST  --  164* 123* 121* 130* 93*  ALT  --  122* 92* 85* 89* 78*  ALKPHOS  --  78 69 69 74 70  BILITOT  --  2.2* 3.0* 3.1* 2.9* 2.2*   ------------------------------------------------------------------------------------------------------------------ No results for input(s): CHOL, HDL, LDLCALC, TRIG, CHOLHDL, LDLDIRECT in the last 72 hours.  No results found for: HGBA1C ------------------------------------------------------------------------------------------------------------------ No results for input(s): TSH, T4TOTAL, T3FREE, THYROIDAB in the last 72 hours.  Invalid input(s): FREET3 ------------------------------------------------------------------------------------------------------------------ No results for input(s): VITAMINB12, FOLATE, FERRITIN, TIBC, IRON, RETICCTPCT in the last 72 hours.  Coagulation profile Recent Labs  Lab 12/08/18 0200  INR 1.17    No results for input(s): DDIMER in the last 72 hours.  Cardiac Enzymes Recent Labs  Lab 12/07/18 1951  TROPONINI <0.03   ------------------------------------------------------------------------------------------------------------------ No results found for: BNP  Inpatient Medications  Scheduled Meds: . Chlorhexidine Gluconate Cloth  6 each Topical Daily  . folic acid  1 mg Oral Daily  . heparin  5,000 Units Subcutaneous Q8H  . multivitamin with minerals  1 tablet Oral Daily  . mupirocin ointment  1 application Nasal BID  . nicotine  21 mg Transdermal Daily   Continuous Infusions:  PRN Meds:.HYDROmorphone (DILAUDID) injection, ondansetron (ZOFRAN) IV, oxyCODONE, senna-docusate, zolpidem  Micro Results Recent Results (from the past 240 hour(s))    Surgical pcr screen  Status: Abnormal   Collection Time: 12/10/18  8:17 PM  Result Value Ref Range Status   MRSA, PCR NEGATIVE NEGATIVE Final   Staphylococcus aureus POSITIVE (A) NEGATIVE Final    Comment: (NOTE) The Xpert SA Assay (FDA approved for NASAL specimens in patients 97 years of age and older), is one component of a comprehensive surveillance program. It is not intended to diagnose infection nor to guide or monitor treatment. Performed at [facility], [place] [address] [place], [place] [address]     Radiology Reports Mr 3d Recon At Scanner  Result Date: 12/08/2018 CLINICAL DATA:  Pancreatitis.  Nausea and vomiting. EXAM: MRI ABDOMEN WITHOUT AND WITH CONTRAST (INCLUDING MRCP) TECHNIQUE: Multiplanar multisequence MR imaging of the abdomen was performed both before and after the administration of intravenous contrast. Heavily T2-weighted images of the biliary and pancreatic ducts were obtained, and three-dimensional MRCP images were rendered by post processing. CONTRAST:  7.5 mL Gadavist COMPARISON:  Ultrasound 12/07/2018 FINDINGS: Lower chest: Trace pleural effusions. Mild LEFT basilar atelectasis. Hepatobiliary: Several nonenhancing hepatic cysts are present throughout the liver. No intrahepatic biliary duct dilatation. There multiple dependent gallstones within the lumen of the gallbladder measuring from 5-10 mm and numbering approximately 10. No gallbladder wall thickening or gallbladder distension. No pericholecystic fluid. The common bile duct common and hepatic duct are normal caliber. No choledocholithiasis. There is tapering of the common bile duct as it passes through the pancreatic head (image 15/16). The more most distal pancreatic duct regains normal caliber. Pancreas: There is peripancreatic edema and fluid surrounding the head body and tail the pancreas. The pancreatic duct is prominent. No evidence of pancreatic ductal interruption. The head of the pancreas is  edematous. The pancreatic parenchyma enhances uniformly. Fluid extends along the anterior margin LEFT pararenal fascia and along the LEFT pericolic gutter. No organized fluid collections. Spleen: Normal spleen. Adrenals/urinary tract: Adrenal glands and kidneys are normal. Stomach/Bowel: Stomach and limited of the small bowel is unremarkable Vascular/Lymphatic: No evidence of vascular complication associated pancreatitis. Musculoskeletal: No aggressive osseous lesion IMPRESSION: 1. Acute non complicated pancreatitis. No organized fluid collections. No ductal interruption. No pancreatic necrosis. 2. Cholelithiasis without evidence of choledocholithiasis. 3. Tapering of the common bile duct through the pancreatic head may relate to edema of the pancreatic head. A passed common bile duct stone could have a similar effect. No mass lesion identified. 4. Small LEFT effusion and atelectasis. Electronically Signed   By: Suzy Bouchard M.D.   On: 12/08/2018 11:27   US Abdomen Limited  Result Date: 12/07/2018 CLINICAL DATA:  Right upper quadrant and epigastric pain. EXAM: ULTRASOUND ABDOMEN LIMITED RIGHT UPPER QUADRANT COMPARISON:  MRCP 02/11/2018, ultrasound abdomen 02/07/2018 FINDINGS: Gallbladder: Physiologic distention of the gallbladder without wall thickening or pericholecystic fluid. Gallstones are noted near the neck of the gallbladder admixed with biliary sludge. Common bile duct: Diameter: 6.7 mm Liver: Echogenic liver parenchyma consistent with steatosis. No space-occupying mass. Portal vein is patent on color Doppler imaging with normal direction of blood flow towards the liver. IMPRESSION: Echogenic liver parenchyma consistent with steatosis. Uncomplicated cholelithiasis. Electronically Signed   By: Ashley Royalty M.D.   On: 12/07/2018 23:31   [name]  Result Date: 12/08/2018 CLINICAL DATA:  Pancreatitis.  Nausea and vomiting. EXAM: MRI ABDOMEN WITHOUT AND WITH CONTRAST (INCLUDING MRCP)  TECHNIQUE: Multiplanar multisequence MR imaging of the abdomen was performed both before and after the administration of intravenous contrast. Heavily T2-weighted images of the biliary and pancreatic ducts were  obtained, and three-dimensional MRCP images were rendered by post processing. CONTRAST:  7.5 mL Gadavist COMPARISON:  Ultrasound 12/07/2018 FINDINGS: Lower chest: Trace pleural effusions. Mild LEFT basilar atelectasis. Hepatobiliary: Several nonenhancing hepatic cysts are present throughout the liver. No intrahepatic biliary duct dilatation. There multiple dependent gallstones within the lumen of the gallbladder measuring from 5-10 mm and numbering approximately 10. No gallbladder wall thickening or gallbladder distension. No pericholecystic fluid. The common bile duct common and hepatic duct are normal caliber. No choledocholithiasis. There is tapering of the common bile duct as it passes through the pancreatic head (image 15/16). The more most distal pancreatic duct regains normal caliber. Pancreas: There is peripancreatic edema and fluid surrounding the head body and tail the pancreas. The pancreatic duct is prominent. No evidence of pancreatic ductal interruption. The head of the pancreas is edematous. The pancreatic parenchyma enhances uniformly. Fluid extends along the anterior margin LEFT pararenal fascia and along the LEFT pericolic gutter. No organized fluid collections. Spleen: Normal spleen. Adrenals/urinary tract: Adrenal glands and kidneys are normal. Stomach/Bowel: Stomach and limited of the small bowel is unremarkable Vascular/Lymphatic: No evidence of vascular complication associated pancreatitis. Musculoskeletal: No aggressive osseous lesion IMPRESSION: 1. Acute non complicated pancreatitis. No organized fluid collections. No ductal interruption. No pancreatic necrosis. 2. Cholelithiasis without evidence of choledocholithiasis. 3. Tapering of the common bile duct through the pancreatic head  may relate to edema of the pancreatic head. A passed common bile duct stone could have a similar effect. No mass lesion identified. 4. Small LEFT effusion and atelectasis. Electronically Signed   By: Suzy Bouchard M.D.   On: 12/08/2018 11:27    Time Spent in minutes 25   Graceanna Theissen M.D on 12/12/2018 at 11:31 AM  Between 7am to 7pm - Pager - [phone]  After 7pm go to [email or web address] - password [facility]  [facility] -  Office  [phone]
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
## 2018-12-13 ENCOUNTER — Inpatient Hospital Stay (HOSPITAL_COMMUNITY): Payer: Self-pay | Admitting: Anesthesiology

## 2018-12-13 ENCOUNTER — Encounter (HOSPITAL_COMMUNITY): Payer: Self-pay | Admitting: Anesthesiology

## 2018-12-13 ENCOUNTER — Inpatient Hospital Stay (HOSPITAL_COMMUNITY): Payer: Self-pay

## 2018-12-13 ENCOUNTER — Encounter (HOSPITAL_COMMUNITY)
Admission: EM | Disposition: A | Discharge status: Home / Self Care | Payer: Self-pay | Source: Home / Self Care | Attending: Internal Medicine

## 2018-12-13 DIAGNOSIS — E440 Moderate protein-calorie malnutrition: Secondary | ICD-10-CM | POA: Diagnosis present

## 2018-12-13 DIAGNOSIS — J181 Lobar pneumonia, unspecified organism: Secondary | ICD-10-CM

## 2018-12-13 DIAGNOSIS — K529 Noninfective gastroenteritis and colitis, unspecified: Secondary | ICD-10-CM

## 2018-12-13 HISTORY — PX: CHOLECYSTECTOMY: SHX55

## 2018-12-13 LAB — COMPREHENSIVE METABOLIC PANEL
ALT: 71 U/L — ABNORMAL HIGH (ref 0–44)
AST: 83 U/L — ABNORMAL HIGH (ref 15–41)
Albumin: 2.8 g/dL — ABNORMAL LOW (ref 3.5–5.0)
Alkaline Phosphatase: 65 U/L (ref 38–126)
Anion gap: 12 (ref 5–15)
BUN: <5 mg/dL — ABNORMAL LOW (ref 6–20)
CO2: 23 mmol/L (ref 22–32)
Calcium: 8.8 mg/dL — ABNORMAL LOW (ref 8.9–10.3)
Chloride: 95 mmol/L — ABNORMAL LOW (ref 98–111)
Creatinine, Ser: 0.89 mg/dL (ref 0.61–1.24)
GFR calc Af Amer: >60 mL/min (ref >60)
GFR calc non Af Amer: >60 mL/min (ref >60)
Glucose, Bld: 90 mg/dL (ref 70–99)
Potassium: 3.8 mmol/L (ref 3.5–5.1)
Sodium: 130 mmol/L — ABNORMAL LOW (ref 135–145)
Total Bilirubin: 2.7 mg/dL — ABNORMAL HIGH (ref 0.3–1.2)
Total Protein: 6.9 g/dL (ref 6.5–8.1)

## 2018-12-13 LAB — LIPASE, BLOOD: Lipase: 199 U/L — ABNORMAL HIGH (ref 11–51)

## 2018-12-13 LAB — CBC
HCT: 39.8 % (ref 39.0–52.0)
Hemoglobin: 14.1 g/dL (ref 13.0–17.0)
MCH: 33.8 pg (ref 26.0–34.0)
MCHC: 35.4 g/dL (ref 30.0–36.0)
MCV: 95.4 fL (ref 80.0–100.0)
Platelets: 343 10*3/uL (ref 150–400)
RBC: 4.17 MIL/uL — ABNORMAL LOW (ref 4.22–5.81)
RDW: 11.8 % (ref 11.5–15.5)
WBC: 10.1 10*3/uL (ref 4.0–10.5)
nRBC: 0 % (ref 0.0–0.2)

## 2018-12-13 SURGERY — LAPAROSCOPIC CHOLECYSTECTOMY WITH INTRAOPERATIVE CHOLANGIOGRAM
Anesthesia: General | Site: Abdomen

## 2018-12-13 MED ORDER — PROPOFOL 10 MG/ML IV BOLUS
INTRAVENOUS | Status: DC | PRN
  Administered 2018-12-13: 150 mg via INTRAVENOUS

## 2018-12-13 MED ORDER — CHLORHEXIDINE GLUCONATE CLOTH 2 % EX PADS
6.0000 | MEDICATED_PAD | Freq: Once | CUTANEOUS | Status: AC
  Administered 2018-12-13: 6 via TOPICAL

## 2018-12-13 MED ORDER — MIDAZOLAM HCL 5 MG/5ML IJ SOLN
INTRAMUSCULAR | Status: DC | PRN
  Administered 2018-12-13: 2 mg via INTRAVENOUS

## 2018-12-13 MED ORDER — CEFAZOLIN SODIUM-DEXTROSE 2-4 GM/100ML-% IV SOLN
2.0000 g | INTRAVENOUS | Status: AC
  Administered 2018-12-13: 2 g via INTRAVENOUS
  Filled 2018-12-13: qty 100

## 2018-12-13 MED ORDER — PHENOL 1.4 % MT LIQD
1.0000 | OROMUCOSAL | Status: DC | PRN
  Filled 2018-12-13: qty 177

## 2018-12-13 MED ORDER — ROCURONIUM BROMIDE 50 MG/5ML IV SOSY
PREFILLED_SYRINGE | INTRAVENOUS | Status: DC | PRN
  Administered 2018-12-13: 50 mg via INTRAVENOUS

## 2018-12-13 MED ORDER — FENTANYL CITRATE (PF) 100 MCG/2ML IJ SOLN
INTRAMUSCULAR | Status: DC | PRN
  Administered 2018-12-13 (×3): 50 ug via INTRAVENOUS
  Administered 2018-12-13: 100 ug via INTRAVENOUS

## 2018-12-13 MED ORDER — BUPIVACAINE-EPINEPHRINE 0.25% -1:200000 IJ SOLN
INTRAMUSCULAR | Status: DC | PRN
  Administered 2018-12-13: 8 mL

## 2018-12-13 MED ORDER — ONDANSETRON HCL 4 MG/2ML IJ SOLN
INTRAMUSCULAR | Status: AC
  Filled 2018-12-13: qty 2

## 2018-12-13 MED ORDER — LACTATED RINGERS IV SOLN
INTRAVENOUS | Status: DC
  Administered 2018-12-13: 12:00:00 via INTRAVENOUS

## 2018-12-13 MED ORDER — FENTANYL CITRATE (PF) 100 MCG/2ML IJ SOLN: 25.0000 ug | INTRAMUSCULAR | Status: DC | PRN

## 2018-12-13 MED ORDER — LIDOCAINE 2% (20 MG/ML) 5 ML SYRINGE
INTRAMUSCULAR | Status: DC | PRN
  Administered 2018-12-13: 60 mg via INTRAVENOUS

## 2018-12-13 MED ORDER — ONDANSETRON HCL 4 MG/2ML IJ SOLN
INTRAMUSCULAR | Status: DC | PRN
  Administered 2018-12-13: 4 mg via INTRAVENOUS

## 2018-12-13 MED ORDER — SODIUM CHLORIDE 0.9 % IR SOLN
Status: DC | PRN
  Administered 2018-12-13: 1000 mL

## 2018-12-13 MED ORDER — FENTANYL CITRATE (PF) 250 MCG/5ML IJ SOLN
INTRAMUSCULAR | Status: AC
  Filled 2018-12-13: qty 5

## 2018-12-13 MED ORDER — ROCURONIUM BROMIDE 50 MG/5ML IV SOSY
PREFILLED_SYRINGE | INTRAVENOUS | Status: AC
  Filled 2018-12-13: qty 5

## 2018-12-13 MED ORDER — SUGAMMADEX SODIUM 200 MG/2ML IV SOLN
INTRAVENOUS | Status: DC | PRN
  Administered 2018-12-13: 200 mg via INTRAVENOUS

## 2018-12-13 MED ORDER — OXYCODONE HCL 5 MG/5ML PO SOLN: 5.0000 mg | Freq: Once | ORAL | Status: AC | PRN

## 2018-12-13 MED ORDER — OXYCODONE HCL 5 MG PO TABS
ORAL_TABLET | ORAL | Status: AC
  Filled 2018-12-13: qty 1

## 2018-12-13 MED ORDER — AMOXICILLIN-POT CLAVULANATE 875-125 MG PO TABS
1.0000 | ORAL_TABLET | Freq: Two times a day (BID) | ORAL | Status: DC
  Administered 2018-12-13: 1 via ORAL
  Filled 2018-12-13: qty 1

## 2018-12-13 MED ORDER — BUPIVACAINE-EPINEPHRINE (PF) 0.25% -1:200000 IJ SOLN
INTRAMUSCULAR | Status: AC
  Filled 2018-12-13: qty 30

## 2018-12-13 MED ORDER — SCOPOLAMINE 1 MG/3DAYS TD PT72
MEDICATED_PATCH | TRANSDERMAL | Status: AC
  Filled 2018-12-13: qty 1

## 2018-12-13 MED ORDER — IOPAMIDOL (ISOVUE-300) INJECTION 61%
INTRAVENOUS | Status: AC
  Filled 2018-12-13: qty 50

## 2018-12-13 MED ORDER — DEXAMETHASONE SODIUM PHOSPHATE 10 MG/ML IJ SOLN
INTRAMUSCULAR | Status: DC | PRN
  Administered 2018-12-13: 10 mg via INTRAVENOUS

## 2018-12-13 MED ORDER — PROPOFOL 10 MG/ML IV BOLUS
INTRAVENOUS | Status: AC
  Filled 2018-12-13: qty 20

## 2018-12-13 MED ORDER — LIDOCAINE 2% (20 MG/ML) 5 ML SYRINGE
INTRAMUSCULAR | Status: AC
  Filled 2018-12-13: qty 5

## 2018-12-13 MED ORDER — MIDAZOLAM HCL 2 MG/2ML IJ SOLN
INTRAMUSCULAR | Status: AC
  Filled 2018-12-13: qty 2

## 2018-12-13 MED ORDER — 0.9 % SODIUM CHLORIDE (POUR BTL) OPTIME
TOPICAL | Status: DC | PRN
  Administered 2018-12-13: 1000 mL

## 2018-12-13 MED ORDER — OXYCODONE HCL 5 MG PO TABS
5.0000 mg | ORAL_TABLET | Freq: Once | ORAL | Status: AC | PRN
  Administered 2018-12-13: 5 mg via ORAL

## 2018-12-13 MED ORDER — DEXAMETHASONE SODIUM PHOSPHATE 10 MG/ML IJ SOLN
INTRAMUSCULAR | Status: AC
  Filled 2018-12-13: qty 1

## 2018-12-13 MED ORDER — PROMETHAZINE HCL 25 MG/ML IJ SOLN: 6.2500 mg | INTRAMUSCULAR | Status: DC | PRN

## 2018-12-13 MED ORDER — SODIUM CHLORIDE 0.9 % IV SOLN
INTRAVENOUS | Status: DC | PRN
  Administered 2018-12-13: 100 mL

## 2018-12-13 SURGICAL SUPPLY — 44 items
APPLIER CLIP ROT 10 11.4 M/L (STAPLE) ×2
BENZOIN TINCTURE PRP APPL 2/3 (GAUZE/BANDAGES/DRESSINGS) ×2 IMPLANT
BLADE CLIPPER SURG (BLADE) IMPLANT
CANISTER SUCT 3000ML PPV (MISCELLANEOUS) ×2 IMPLANT
CHLORAPREP W/TINT 26ML (MISCELLANEOUS) ×2 IMPLANT
CLIP APPLIE ROT 10 11.4 M/L (STAPLE) ×1 IMPLANT
COVER MAYO STAND STRL (DRAPES) ×2 IMPLANT
COVER SURGICAL LIGHT HANDLE (MISCELLANEOUS) ×2 IMPLANT
COVER WAND RF STERILE (DRAPES) ×2 IMPLANT
DRAPE C-ARM 42X72 X-RAY (DRAPES) ×2 IMPLANT
DRSG TEGADERM 2-3/8X2-3/4 SM (GAUZE/BANDAGES/DRESSINGS) ×2 IMPLANT
DRSG TEGADERM 4X4.75 (GAUZE/BANDAGES/DRESSINGS) ×2 IMPLANT
ELECT REM PT RETURN 9FT ADLT (ELECTROSURGICAL) ×2
ELECTRODE REM PT RTRN 9FT ADLT (ELECTROSURGICAL) ×1 IMPLANT
FILTER SMOKE EVAC LAPAROSHD (FILTER) ×2 IMPLANT
GAUZE SPONGE 2X2 8PLY STRL LF (GAUZE/BANDAGES/DRESSINGS) ×1 IMPLANT
GLOVE BIO SURGEON STRL SZ7 (GLOVE) ×2 IMPLANT
GLOVE BIOGEL PI IND STRL 7.5 (GLOVE) ×1 IMPLANT
GLOVE BIOGEL PI INDICATOR 7.5 (GLOVE) ×1
GOWN STRL REUS W/ TWL LRG LVL3 (GOWN DISPOSABLE) ×3 IMPLANT
GOWN STRL REUS W/TWL LRG LVL3 (GOWN DISPOSABLE) ×3
KIT BASIN OR (CUSTOM PROCEDURE TRAY) ×2 IMPLANT
KIT TURNOVER KIT B (KITS) ×2 IMPLANT
NS IRRIG 1000ML POUR BTL (IV SOLUTION) ×2 IMPLANT
PAD ARMBOARD 7.5X6 YLW CONV (MISCELLANEOUS) ×2 IMPLANT
POUCH RETRIEVAL ECOSAC 10 (ENDOMECHANICALS) IMPLANT
POUCH RETRIEVAL ECOSAC 10MM (ENDOMECHANICALS)
POUCH SPECIMEN RETRIEVAL 10MM (ENDOMECHANICALS) IMPLANT
SCISSORS LAP 5X35 DISP (ENDOMECHANICALS) ×2 IMPLANT
SET CHOLANGIOGRAPH 5 50 .035 (SET/KITS/TRAYS/PACK) ×2 IMPLANT
SET IRRIG TUBING LAPAROSCOPIC (IRRIGATION / IRRIGATOR) ×2 IMPLANT
SET TUBE SMOKE EVAC HIGH FLOW (TUBING) ×2 IMPLANT
SLEEVE ENDOPATH XCEL 5M (ENDOMECHANICALS) ×2 IMPLANT
SPECIMEN JAR SMALL (MISCELLANEOUS) ×2 IMPLANT
SPONGE GAUZE 2X2 STER 10/PKG (GAUZE/BANDAGES/DRESSINGS) ×1
STRIP CLOSURE SKIN 1/2X4 (GAUZE/BANDAGES/DRESSINGS) ×2 IMPLANT
SUT MNCRL AB 4-0 PS2 18 (SUTURE) ×2 IMPLANT
TOWEL OR 17X24 6PK STRL BLUE (TOWEL DISPOSABLE) ×2 IMPLANT
TOWEL OR 17X26 10 PK STRL BLUE (TOWEL DISPOSABLE) ×2 IMPLANT
TRAY LAPAROSCOPIC MC (CUSTOM PROCEDURE TRAY) ×2 IMPLANT
TROCAR XCEL BLUNT TIP 100MML (ENDOMECHANICALS) ×2 IMPLANT
TROCAR XCEL NON-BLD 11X100MML (ENDOMECHANICALS) ×2 IMPLANT
TROCAR XCEL NON-BLD 5MMX100MML (ENDOMECHANICALS) ×2 IMPLANT
WATER STERILE IRR 1000ML POUR (IV SOLUTION) ×2 IMPLANT
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
## 2018-12-13 NOTE — Progress Notes (Signed)
Initial Nutrition Assessment  DOCUMENTATION CODES:   Non-severe (moderate) malnutrition in context of acute illness/injury  INTERVENTION:   Ordered new wt (no wt taken this admission)  When diet advanced, will order  - Ensure Enlive BID (each provides 350 kcal and 20 g proteing) - ProStat BID (each provides 100 kcal and 15 g protein)   NUTRITION DIAGNOSIS:   Moderate Malnutrition related to acute illness as evidenced by energy intake < or equal to 50% for > or equal to 5 days, mild fat depletion, mild muscle depletion.   GOAL:   Patient will meet greater than or equal to 90% of their needs   MONITOR:   Diet advancement, PO intake, Supplement acceptance, Labs, Weight trends  REASON FOR ASSESSMENT:   Malnutrition Screening Tool    ASSESSMENT:   53 yo male, admitted with pancreatitis (recurrent). PMH significant for alcohol abuse, cholecystitis. In need of cholecystectomy.   Labs: sodium 130, chloride 95, BUN <5, Lipase 199, AST 83, ALT 71, tBili 2.7, positive for Staph aureus Meds: folvite, MVI with minerals  Pt sitting in bed at time of visit, preparing to go to surgery.  Pt reports normal appetite PTA, typically eating 3 meals daily. Pt does not follow any special diet, and confirms taking folvite and MVI.   Per pt, UBW ~150# (68.2 kg). Pt reports losing ~5# x 3 months, which is not clinically significant. No wt for this admission - ordered. Last weight in March 2019 is 56.5 kg.  Pt endorses nausea PTA and states that Zofran has helped. Last BM yesterday, per pt, and only a small amount. Pt reports no discomfort at this time. Denies difficulty chewing or swallowing.  Encouraged pt to include protein-rich foods with all meals and snacks, and to eat those foods first if not feeling hungry, when diet advances. Discussed the possibility of poor appetite after surgery, and encouraged pt to take it slow. Pt endorses some diarrhea with Ensure in the past, but is amenable to  trying again. Also willing to try ProStat as an alternative protein source.   NUTRITION - FOCUSED PHYSICAL EXAM:   Most Recent Value  Orbital Region  Mild depletion  Upper Arm Region  Mild depletion  Thoracic and Lumbar Region  Mild depletion  Buccal Region  Mild depletion  Temple Region  Moderate depletion  Clavicle Bone Region  Mild depletion  Clavicle and Acromion Bone Region  Mild depletion  Scapular Bone Region  Mild depletion  Dorsal Hand  No depletion  Patellar Region  No depletion  Anterior Thigh Region  No depletion  Posterior Calf Region  No depletion  Edema (RD Assessment)  None  Hair  Reviewed  Eyes  Reviewed  Mouth  Other (Comment) white coating  Skin  Reviewed  Nails  Reviewed      Diet Order:  No intake noted, per nsg documentation Diet Order            Diet NPO time specified  Diet effective midnight              EDUCATION NEEDS:  No education needs have been identified at this time  Skin:  Skin Assessment: Reviewed RN Assessment  Last BM:  1/12, per pt   Height:  Ht Readings from Last 1 Encounters:  02/07/18 5' 8" (1.727 m)    Weight:  Wt Readings from Last 1 Encounters:  02/07/18 56.5 kg    Ideal Body Weight:  70 kg  BMI:  There is no height or  weight on file to calculate BMI.  Estimated Nutritional Needs:   Kcal:  2100-2450 calories daily (30-35 kcal/kg IBW)  Protein:  84-105 gm daily (1.2-1.5 g/kg IBW)  Fluid:  >/= 2.1 L daily or per MD discretion  Althea Grimmer, MS, RDN, LDN Pager: [phone] Available Mondays and Fridays, 9am-2pm

## 2018-12-13 NOTE — Anesthesia Postprocedure Evaluation (Signed)
Anesthesia Post Note  Patient: Wesley [name]  Procedure(s) Performed: LAPAROSCOPIC CHOLECYSTECTOMY WITH INTRAOPERATIVE CHOLANGIOGRAM (N/A Abdomen)     Patient location during evaluation: PACU Anesthesia Type: General Level of consciousness: awake and alert Pain management: pain level controlled Vital Signs Assessment: post-procedure vital signs reviewed and stable Respiratory status: spontaneous breathing, nonlabored ventilation, respiratory function stable and patient connected to nasal cannula oxygen Cardiovascular status: blood pressure returned to baseline and stable Postop Assessment: no apparent nausea or vomiting Anesthetic complications: no    Last Vitals:  Vitals:   12/13/18 1400 12/13/18 1415  BP: (!) 127/91 132/85  Pulse: (!) 110 96  Resp: 20 20  Temp:  36.5 C  SpO2: 98% 96%                   Effie Berkshire

## 2018-12-13 NOTE — Anesthesia Procedure Notes (Signed)
Procedure Name: Intubation Date/Time: 12/13/2018 12:40 PM Performed by: Kyung Rudd, CRNA Pre-anesthesia Checklist: Patient identified, Emergency Drugs available, Suction available and Patient being monitored Patient Re-evaluated:Patient Re-evaluated prior to induction Oxygen Delivery Method: Circle system utilized Preoxygenation: Pre-oxygenation with 100% oxygen Induction Type: IV induction Ventilation: Mask ventilation without difficulty Laryngoscope Size: Mac and 4 Grade View: Grade II Tube type: Oral Tube size: 7.5 mm Number of attempts: 1 Airway Equipment and Method: Stylet Placement Confirmation: ETT inserted through vocal cords under direct vision,  positive ETCO2 and breath sounds checked- equal and bilateral Secured at: 21 cm Tube secured with: Tape Dental Injury: Teeth and Oropharynx as per pre-operative assessment

## 2018-12-13 NOTE — Progress Notes (Signed)
Patient back from OR, Cholecystectomy procedure, patient resting in bed with bed alarm on, 4 incisions on abdomen clean dry and intact
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
## 2018-12-13 NOTE — Transfer of Care (Signed)
Immediate Anesthesia Transfer of Care Note  Patient: Wesley [name]  Procedure(s) Performed: LAPAROSCOPIC CHOLECYSTECTOMY WITH INTRAOPERATIVE CHOLANGIOGRAM (N/A Abdomen)  Patient Location: PACU  Anesthesia Type:General  Level of Consciousness: awake, alert  and oriented  Airway & Oxygen Therapy: Patient Spontanous Breathing and Patient connected to face mask oxygen  Post-op Assessment: Report given to RN and Post -op Vital signs reviewed and stable  Post vital signs: Reviewed and stable  Last Vitals:  Vitals Value Taken Time  BP 141/93 12/13/2018  1:55 PM  Temp 36.5 C 12/13/2018  1:55 PM  Pulse 107 12/13/2018  1:56 PM  Resp 20 12/13/2018  1:56 PM  SpO2 95 % 12/13/2018  1:56 PM  Vitals shown include unvalidated device data.  Last Pain:  Vitals:   12/13/18 0830  TempSrc:   PainSc: 0-No pain      Patients Stated Pain Goal: 0 (77/41/28 7867)  Complications: No apparent anesthesia complications

## 2018-12-14 ENCOUNTER — Encounter (HOSPITAL_COMMUNITY): Payer: Self-pay | Admitting: Surgery

## 2018-12-14 DIAGNOSIS — K851 Biliary acute pancreatitis without necrosis or infection: Secondary | ICD-10-CM | POA: Diagnosis present

## 2018-12-14 DIAGNOSIS — E440 Moderate protein-calorie malnutrition: Secondary | ICD-10-CM

## 2018-12-14 LAB — COMPREHENSIVE METABOLIC PANEL
ALT: 63 U/L — ABNORMAL HIGH (ref 0–44)
AST: 73 U/L — ABNORMAL HIGH (ref 15–41)
Albumin: 2.9 g/dL — ABNORMAL LOW (ref 3.5–5.0)
Alkaline Phosphatase: 62 U/L (ref 38–126)
Anion gap: 13 (ref 5–15)
BUN: 8 mg/dL (ref 6–20)
CO2: 23 mmol/L (ref 22–32)
Calcium: 9.1 mg/dL (ref 8.9–10.3)
Chloride: 95 mmol/L — ABNORMAL LOW (ref 98–111)
Creatinine, Ser: 1.09 mg/dL (ref 0.61–1.24)
GFR calc Af Amer: >60 mL/min (ref >60)
GFR calc non Af Amer: >60 mL/min (ref >60)
Glucose, Bld: 125 mg/dL — ABNORMAL HIGH (ref 70–99)
Potassium: 3.8 mmol/L (ref 3.5–5.1)
Sodium: 131 mmol/L — ABNORMAL LOW (ref 135–145)
Total Bilirubin: 1.8 mg/dL — ABNORMAL HIGH (ref 0.3–1.2)
Total Protein: 7.5 g/dL (ref 6.5–8.1)

## 2018-12-14 LAB — LIPASE, BLOOD: Lipase: 131 U/L — ABNORMAL HIGH (ref 11–51)

## 2018-12-14 LAB — CBC
HCT: 41.2 % (ref 39.0–52.0)
Hemoglobin: 14.4 g/dL (ref 13.0–17.0)
MCH: 33.3 pg (ref 26.0–34.0)
MCHC: 35 g/dL (ref 30.0–36.0)
MCV: 95.4 fL (ref 80.0–100.0)
Platelets: 401 10*3/uL — ABNORMAL HIGH (ref 150–400)
RBC: 4.32 MIL/uL (ref 4.22–5.81)
RDW: 11.9 % (ref 11.5–15.5)
WBC: 13 10*3/uL — ABNORMAL HIGH (ref 4.0–10.5)
nRBC: 0 % (ref 0.0–0.2)

## 2018-12-14 MED ORDER — AMOXICILLIN-POT CLAVULANATE 875-125 MG PO TABS: 1.0000 | ORAL_TABLET | Freq: Two times a day (BID) | ORAL | 0 refills | Status: AC

## 2018-12-14 MED ORDER — NICOTINE 21 MG/24HR TD PT24: 21.0000 mg | MEDICATED_PATCH | Freq: Every day | TRANSDERMAL | 0 refills | Status: DC

## 2018-12-14 MED ORDER — OXYCODONE HCL 5 MG PO TABS: 5.0000 mg | ORAL_TABLET | ORAL | 0 refills | Status: DC | PRN

## 2018-12-14 NOTE — Care Management Note (Signed)
Case Management Note  Patient Details  Name: Wesley [name] MRN: [id number] Date of Birth: Mar 20, 1966  Subjective/Objective:                    Action/Plan: Pt discharging home with self care. Has f/u appt at [facility]. Pts prescriptions sent to [facility]. CM provided him Good RX card to assist with the cost. Pt feels he can afford the d/c medications.  Mother to provide transport home.   Expected Discharge Date:  12/14/18               Expected Discharge Plan:  Home/Self Care  In-House Referral:     Discharge planning Services  CM Consult, [facility], Medication Assistance  Post Acute Care Choice:    Choice offered to:     DME Arranged:    DME Agency:     HH Arranged:    HH Agency:     Status of Service:  Completed, signed off  If discussed at [employer], dates discussed:    Additional Comments:  Pollie Friar, RN 12/14/2018, 2:03 PM
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
## 2018-12-14 NOTE — Discharge Instructions (Signed)
Acute Pancreatitis  Acute pancreatitis happens when the pancreas gets swollen. The pancreas is a large gland behind the stomach. The pancreas helps control blood sugar. It also makes enzymes that help digest food. This condition happens when the enzymes attack the pancreas and damage it. Most attacks last a couple of days and are dangerous. The lungs, heart, and kidneys may stop working. What are the causes?  Alcohol abuse.  Drug abuse.  Gallstones.  Some medicines.  Some chemicals.  Infection.  Damage caused by an accident.Wesley [name] (abdominal) surgery.  In some cases, the cause is not known. What are the signs or symptoms?  Pain in the upper belly and back.  Swelling of the belly  Feeling sick to your stomach (nausea) and throwing up (vomiting). How is this treated?  You will probably have to stay in the hospital. ? Treatment may include:  Fluid through an IV.  A tube to remove stomach contents and stop you from throwing up.  Not eating for 3-4 days.  Pain medicine.  Antibiotic medicines if you have an infection.  Surgery on the pancreas or gallbladder. Follow these instructions at home: Eating and drinking   Follow instructions from your doctor about diet.  Eat small meals often. Avoid eating big meals.  Eat foods that do not have a lot of fat in them.  Drink enough fluid to keep your pee (urine) pale yellow.  Do not drink alcohol if it caused your condition. General instructions  Take over-the-counter and prescription medicines only as told by your doctor.  Do not use cigarettes, e-cigarettes, and chewing tobacco. If you need help quitting, ask your doctor.  Get plenty of rest.  If directed, check your blood sugar at home as told by your doctor.  Keep all follow-up visits as told by your doctor. This is important. Contact a doctor if:  You do not get better as quickly as expected.  You have new symptoms.  Your symptoms get  worse.  You have lasting pain or weakness.  You continue to feel sick to your stomach.  You get better and then you have another pain attack.  You have a fever. Get help right away if:  You cannot eat or keep fluids down.  Your pain becomes very bad.  Your skin or the white part of your eyes turns yellow.  You throw up.  You feel dizzy or you pass out.  Your blood sugar is high (over 300 mg/dL). Summary  Acute pancreatitis happens when the pancreas gets swollen.  This condition is usually caused by alcohol abuse, drug abuse, or gallstones.  You will probably have to stay in the hospital for treatment. This information is not intended to replace advice given to you by your health care provider. Make sure you discuss any questions you have with your health care provider. Document Released: 05/05/2008 Document Revised: 03/23/2017 Document Reviewed: 08/21/2015 Elsevier Interactive Patient Education  2019 [name] LAPAROSCOPIC SURGERY: POST OP INSTRUCTIONS Always review your discharge instruction sheet given to you by the facility where your surgery was performed. IF YOU HAVE DISABILITY OR FAMILY LEAVE FORMS, YOU MUST BRING THEM TO THE OFFICE FOR PROCESSING.   DO NOT GIVE THEM TO YOUR DOCTOR.  PAIN CONTROL  1. First take acetaminophen (Tylenol) AND/or ibuprofen (Advil) to control your pain after surgery.  Follow directions on package.  Taking acetaminophen (Tylenol) and/or ibuprofen (Advil) regularly after surgery will help to control your pain and  lower the amount of prescription pain medication you may need.  You should not take more than 3,000 mg (3 grams) of acetaminophen (Tylenol) in 24 hours.  You should not take ibuprofen (Advil), aleve, motrin, naprosyn or other NSAIDS if you have a history of stomach ulcers or chronic kidney disease.  2. A prescription for pain medication may be given to you upon discharge.  Take your pain  medication as prescribed, if you still have uncontrolled pain after taking acetaminophen (Tylenol) or ibuprofen (Advil). 3. Use ice packs to help control pain. 4. If you need a refill on your pain medication, please contact your pharmacy.  They will contact our office to request authorization. Prescriptions will not be filled after 5pm or on week-ends.  HOME MEDICATIONS 5. Take your usually prescribed medications unless otherwise directed.  DIET 6. You should follow a light diet the first few days after arrival home.  Be sure to include lots of fluids daily. Avoid fatty, fried foods.   CONSTIPATION 7. It is common to experience some constipation after surgery and if you are taking pain medication.  Increasing fluid intake and taking a stool softener (such as Colace) will usually help or prevent this problem from occurring.  A mild laxative (Milk of Magnesia or Miralax) should be taken according to package instructions if there are no bowel movements after 48 hours.  WOUND/INCISION CARE 8. Most patients will experience some swelling and bruising in the area of the incisions.  Ice packs will help.  Swelling and bruising can take several days to resolve.  9. Unless discharge instructions indicate otherwise, follow guidelines below  a. STERI-STRIPS - you may remove your outer bandages 48 hours after surgery, and you may shower at that time.  You have steri-strips (small skin tapes) in place directly over the incision.  These strips should be left on the skin for 7-10 days.   b. DERMABOND/SKIN GLUE - you may shower in 24 hours.  The glue will flake off over the next 2-3 weeks. 10. Any sutures or staples will be removed at the office during your follow-up visit.  ACTIVITIES 11. You may resume regular (light) daily activities beginning the next [place]--such as daily self-care, walking, climbing stairs--gradually increasing activities as tolerated.  You may have sexual intercourse when it is comfortable.   Refrain from any heavy lifting or straining until approved by your doctor. a. You may drive when you are no longer taking prescription pain medication, you can comfortably wear a seatbelt, and you can safely maneuver your car and apply brakes.  FOLLOW-UP 12. You should see your doctor in the office for a follow-up appointment approximately 2-3 weeks after your surgery.  You should have been given your post-op/follow-up appointment when your surgery was scheduled.  If you did not receive a post-op/follow-up appointment, make sure that you call for this appointment within a [place] or two after you arrive home to insure a convenient appointment time.   WHEN TO CALL YOUR DOCTOR: 1. Fever over 101.0 2. Inability to urinate 3. Continued bleeding from incision. 4. Increased pain, redness, or drainage from the incision. 5. Increasing abdominal pain  The clinic staff is available to answer your questions during regular business hours.  Please dont hesitate to call and ask to speak to one of the nurses for clinical concerns.  If you have a medical emergency, go to the nearest emergency room or call 911.  A surgeon from [facility] is always on call at the  hospital. [address] [place], [place], [place]  [address] ? [address] [place], [place], [place]   [address] [phone] ? [phone] ? FAX [phone] Web site: [email or web address]

## 2018-12-14 NOTE — Progress Notes (Signed)
Reviewed AVS discharge instructions with patient/caregiver. Patient/caregiver verbalizes understanding of instructions received. AVS and prescriptions received by patient/caregiver. If present, telemetry box removed and central cardiac monitoring department notified of discharge. Peripheral IV removed, site benign with tip intact.

## 2019-01-04 ENCOUNTER — Ambulatory Visit (INDEPENDENT_AMBULATORY_CARE_PROVIDER_SITE_OTHER): Payer: Self-pay | Admitting: Family Medicine

## 2019-01-04 ENCOUNTER — Other Ambulatory Visit: Payer: Self-pay

## 2019-01-04 ENCOUNTER — Encounter (INDEPENDENT_AMBULATORY_CARE_PROVIDER_SITE_OTHER): Payer: Self-pay | Admitting: Family Medicine

## 2019-01-04 VITALS — BP 132/88 | HR 109 | Temp 98.2°F | Ht 68.0 in | Wt 143.2 lb

## 2019-01-04 DIAGNOSIS — F17210 Nicotine dependence, cigarettes, uncomplicated: Secondary | ICD-10-CM

## 2019-01-04 DIAGNOSIS — K851 Biliary acute pancreatitis without necrosis or infection: Secondary | ICD-10-CM

## 2019-01-04 DIAGNOSIS — Z1211 Encounter for screening for malignant neoplasm of colon: Secondary | ICD-10-CM

## 2019-01-04 DIAGNOSIS — E440 Moderate protein-calorie malnutrition: Secondary | ICD-10-CM

## 2019-01-04 DIAGNOSIS — Z9049 Acquired absence of other specified parts of digestive tract: Secondary | ICD-10-CM

## 2019-01-04 DIAGNOSIS — G609 Hereditary and idiopathic neuropathy, unspecified: Secondary | ICD-10-CM

## 2019-01-04 DIAGNOSIS — Z23 Encounter for immunization: Secondary | ICD-10-CM

## 2019-01-04 DIAGNOSIS — R739 Hyperglycemia, unspecified: Secondary | ICD-10-CM

## 2019-01-04 DIAGNOSIS — Z09 Encounter for follow-up examination after completed treatment for conditions other than malignant neoplasm: Secondary | ICD-10-CM

## 2019-01-04 DIAGNOSIS — R7989 Other specified abnormal findings of blood chemistry: Secondary | ICD-10-CM

## 2019-01-04 DIAGNOSIS — E871 Hypo-osmolality and hyponatremia: Secondary | ICD-10-CM

## 2019-01-04 DIAGNOSIS — R945 Abnormal results of liver function studies: Secondary | ICD-10-CM

## 2019-01-04 LAB — POCT GLYCOSYLATED HEMOGLOBIN (HGB A1C): Hemoglobin A1C: 5.3 % (ref 4.0–5.6)

## 2019-01-04 MED ORDER — GABAPENTIN 300 MG PO CAPS: 300.0000 mg | ORAL_CAPSULE | Freq: Three times a day (TID) | ORAL | 6 refills | Status: DC

## 2019-01-04 MED ORDER — THIAMINE HCL 100 MG PO TABS: 100.0000 mg | ORAL_TABLET | Freq: Every day | ORAL | 6 refills | Status: DC
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
## 2019-01-05 LAB — COMPREHENSIVE METABOLIC PANEL WITH GFR
ALT: 134 IU/L — ABNORMAL HIGH (ref 0–44)
AST: 242 IU/L — ABNORMAL HIGH (ref 0–40)
Albumin/Globulin Ratio: 1 — ABNORMAL LOW (ref 1.2–2.2)
Albumin: 3.7 g/dL — ABNORMAL LOW (ref 3.8–4.9)
Alkaline Phosphatase: 131 IU/L — ABNORMAL HIGH (ref 39–117)
BUN/Creatinine Ratio: 5 — ABNORMAL LOW (ref 9–20)
BUN: 4 mg/dL — ABNORMAL LOW (ref 6–24)
Bilirubin Total: 1.3 mg/dL — ABNORMAL HIGH (ref 0.0–1.2)
CO2: 24 mmol/L (ref 20–29)
Calcium: 9.5 mg/dL (ref 8.7–10.2)
Chloride: 102 mmol/L (ref 96–106)
Creatinine, Ser: 0.82 mg/dL (ref 0.76–1.27)
GFR calc Af Amer: 117 mL/min/1.73
GFR calc non Af Amer: 101 mL/min/1.73
Globulin, Total: 3.6 g/dL (ref 1.5–4.5)
Glucose: 88 mg/dL (ref 65–99)
Potassium: 3.8 mmol/L (ref 3.5–5.2)
Sodium: 144 mmol/L (ref 134–144)
Total Protein: 7.3 g/dL (ref 6.0–8.5)

## 2019-01-11 ENCOUNTER — Telehealth (INDEPENDENT_AMBULATORY_CARE_PROVIDER_SITE_OTHER): Payer: Self-pay

## 2019-01-11 NOTE — Telephone Encounter (Signed)
Patient is aware that liver enzymes remain abnormal but nutritional status has improved. [name], CMA

## 2019-01-11 NOTE — Telephone Encounter (Signed)
-----  Message from Antony Blackbird, MD sent at 01/08/2019  8:30 PM EST ----- Liver enzymes remain abnormal but nutritional status has improved.

## 2019-03-18 IMAGING — MR MR ABDOMEN WO/W CM MRCP
11 of 20 series · 18 of 48 positions shown · IV contrast (multihance)
Comparison: CT scan 02/07/2018 and ultrasound also of 02/07/2018

CLINICAL DATA: Acute alcoholic pancreatitis. Encephalopathy.
Abnormal liver function tests. Cholelithiasis.

EXAM:
MRI ABDOMEN WITHOUT AND WITH CONTRAST (INCLUDING MRCP)
TECHNIQUE: Multiplanar multisequence MR imaging of the abdomen was performed
both before and after the administration of intravenous contrast.
Heavily T2-weighted images of the biliary and pancreatic ducts were
obtained, and three-dimensional MRCP images were rendered by post
processing.
CONTRAST:  10mL MULTIHANCE GADOBENATE DIMEGLUMINE 529 MG/ML IV SOLN

[Series 3: T2 fat-sat · axial · 5.0mm · 0.70mm/px · 1 of 45 slices shown]
[im 1/45]
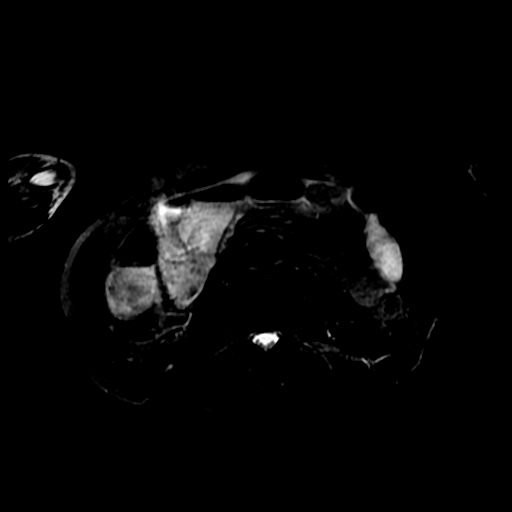

[Series 4: T2 · axial · 5.0mm · 0.70mm/px · z∈[-101,+119]mm · 2 of 45 slices shown (1 of 2)]
[im 1/45]
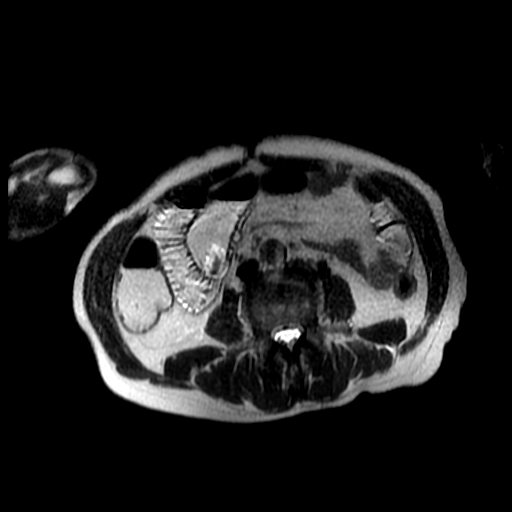
[im 45/45]
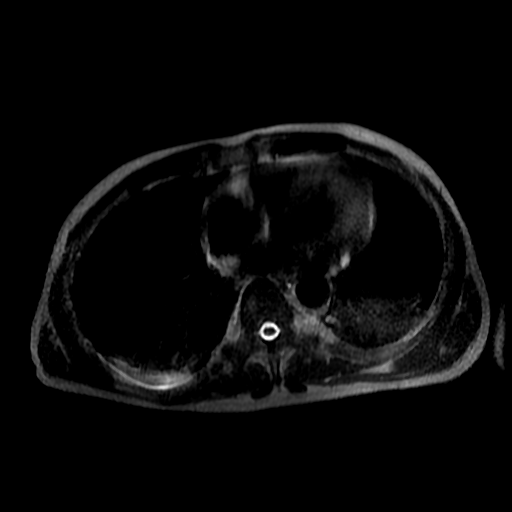

[Series 5: T2 · coronal · 5.0mm · 0.82mm/px · 2 of 35 slices shown (2 of 2)]
[im 1/35]
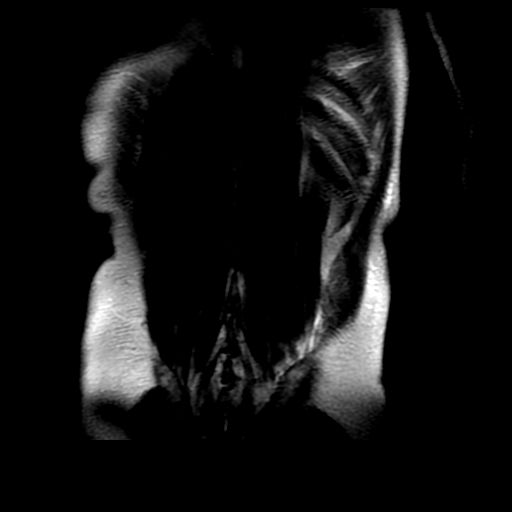
[im 35/35]
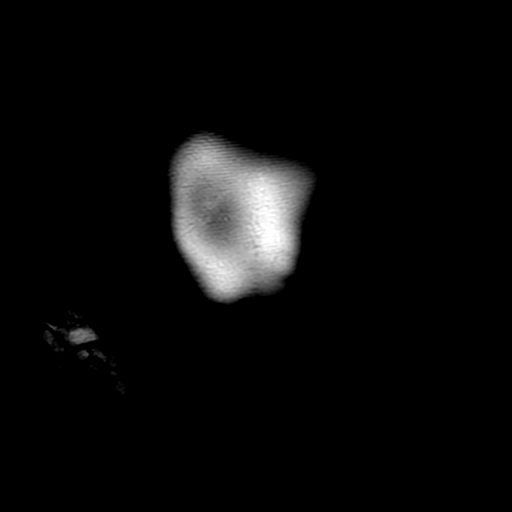

[Series 7: MRCP · coronal · 2.0mm · 0.70mm/px · 1 of 31 slices shown (1 of 2)]
[im 1/31]
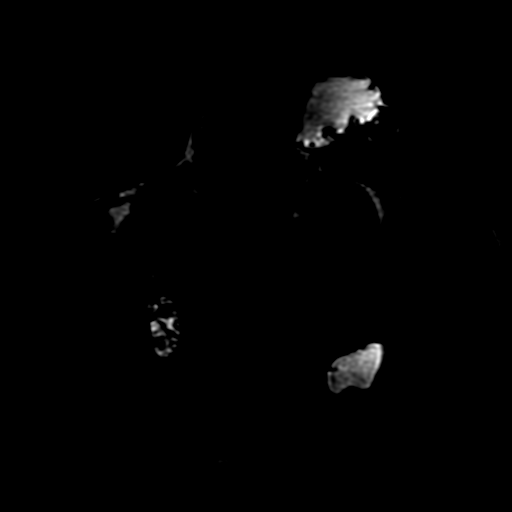

[Series 11: ax dualecho · axial · 5.0mm · 0.70mm/px · z∈[-101,+114]mm · 4 of 88 slices shown]
[im 1/88]
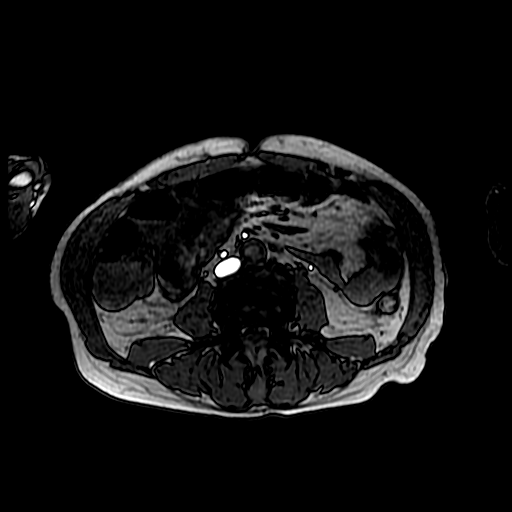
[im 30/88]
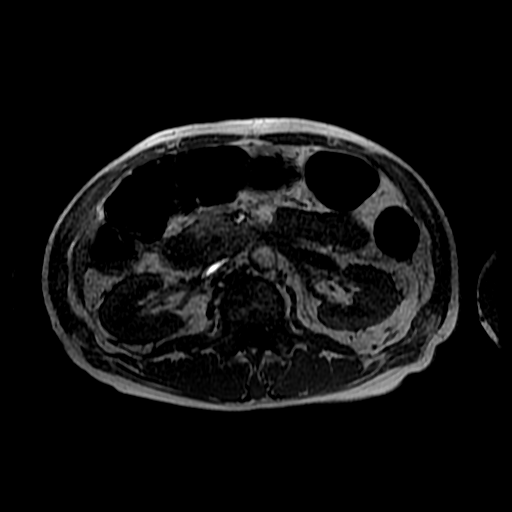
[im 59/88]
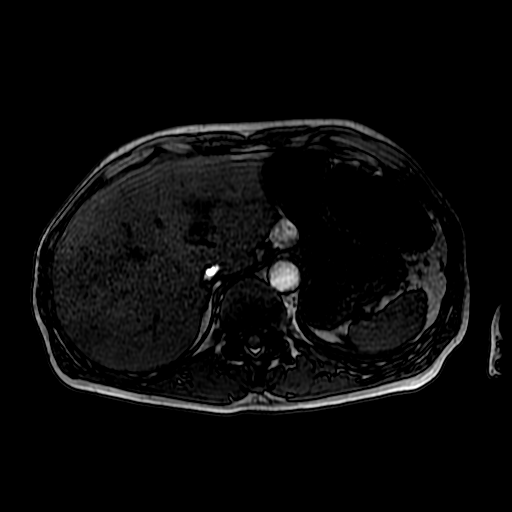
[im 88/88]
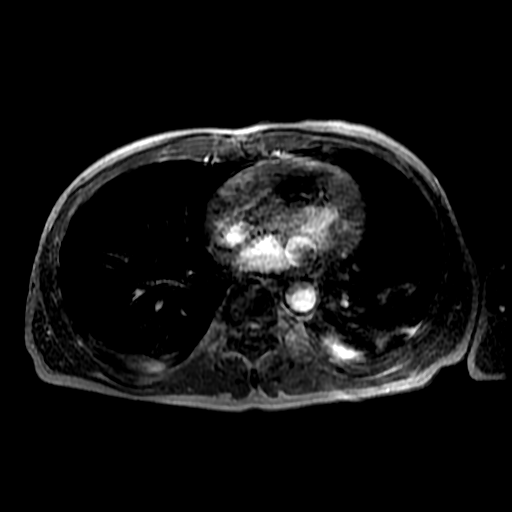

[Series 12: MRCP · oblique · 50.0mm · 0.70mm/px · 1 of 6 slices shown (2 of 2)]
[im 1/6]
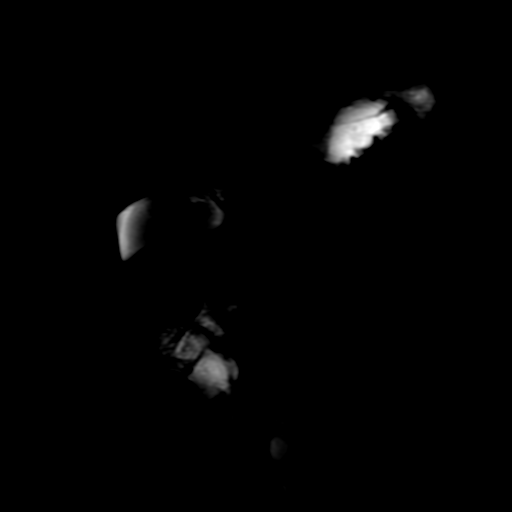

[Series 14: T1 dynamic post-contrast · coronal · 5.0mm · 0.78mm/px · 3 of 64 slices shown]
[im 1/64]
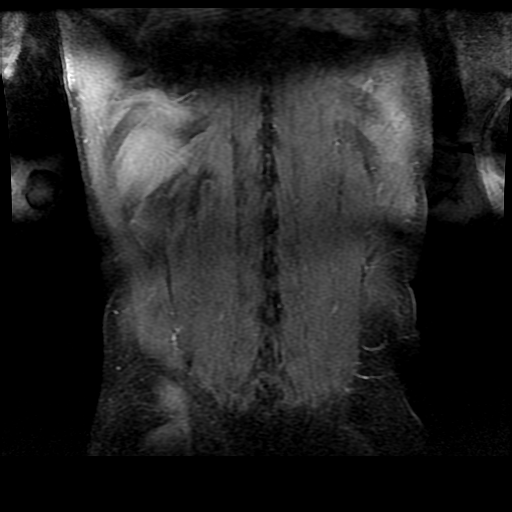
[im 32/64]
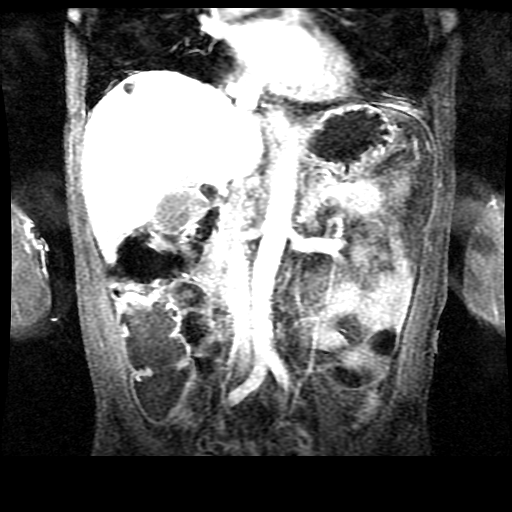
[im 64/64]
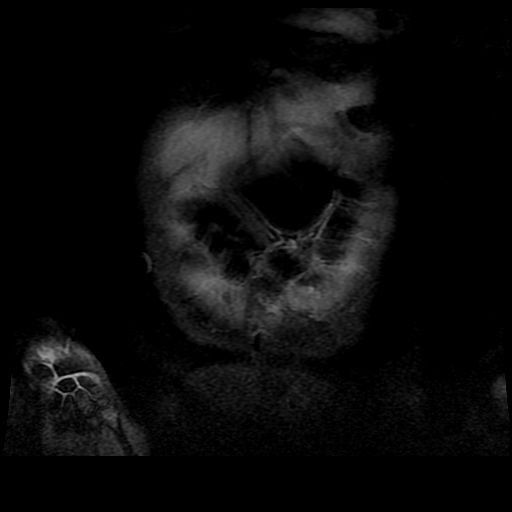

[Series 601: processed images · coronal · 1.6mm · 0.49mm/px · 1 of 9 slices shown (1 of 2)]
[im 1/9]
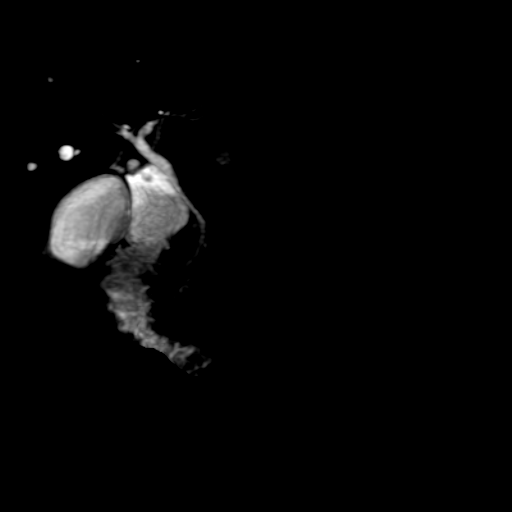

[Series 602: processed images · axial · 1.6mm · 0.49mm/px · 1 of 16 slices shown (2 of 2)]
[im 1/16]
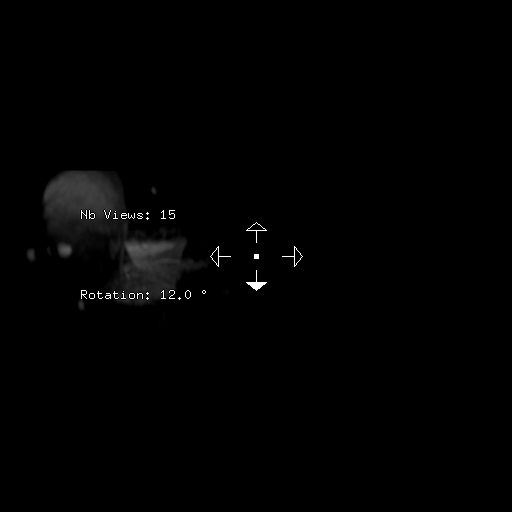

[Series 800: DWI · axial · 6.0mm · 1.37mm/px · 1 of 30 slices shown]
[im 1/30]
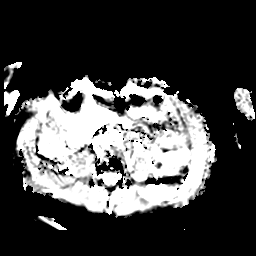

[Series 1300: T1 dynamic · axial · 6.0mm · 0.70mm/px · 1 of 72 slices shown]
[im 1/72]
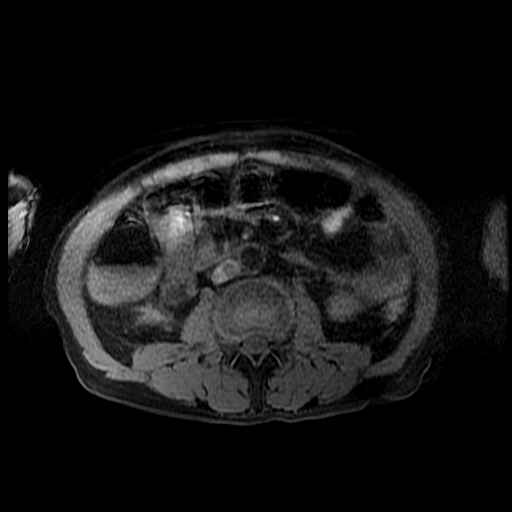

[18 of 48 positions shown; findings below may reference images not displayed]

FINDINGS: Despite efforts by the technologist and patient, motion artifact is
present on today's exam and could not be eliminated. The patient has
encephalopathy and was somnolent during imaging, as well as
tachypneic. This reduces exam sensitivity and specificity.

Lower chest: Small bilateral pleural effusions. Dependent airspace
opacities in both lungs.

Hepatobiliary: Small nonenhancing fluid signal intensity lesions in
both lungs favor small cysts or biliary hamartomas. No worrisome
focal enhancing liver lesion. No significant nodularity or cirrhotic
morphologic findings in the liver. The common hepatic duct measures
up to 5 mm in diameter. The common bile duct measures 4 mm in
diameter. No observed filling defect or obvious stricture in either.
No significant degree of intrahepatic biliary dilatation. Sludge is
present in the gallbladder, along with multiple small dependent
layering gallstones as shown on image [DATE]. Trace perihepatic and
pericholecystic fluid. No significant gallbladder wall thickening.

Pancreas: There is only equivocal peripancreatic edema. No findings
of pancreatic necrosis, pseudocyst, or abscess.

Spleen:  Unremarkable

Adrenals/Urinary Tract:  Unremarkable

Stomach/Bowel: Mild wall thickening in the descending and transverse
duodenum probably related to secondary inflammation.

Vascular/Lymphatic: Aortoiliac atherosclerotic vascular disease.
Patent portal vein and splenic vein. Small likely reactive right
gastric and celiac chain lymph nodes.

Other: Trace ascites adjacent to the liver and spleen. Mild
bilateral perirenal stranding appears symmetric.

Musculoskeletal: Degenerative disc disease and degenerative endplate
findings at L5-S1.
IMPRESSION: 1. Mild peripancreatic edema likely with secondary inflammation of
the adjacent descending and proximal transverse duodenum, suggesting
acute pancreatitis. No findings of pancreatic necrosis, pseudocyst,
or abscess.
2. Cholelithiasis is present, without evidence of
choledocholithiasis. No biliary dilatation.
3. Trace upper abdominal ascites and perirenal stranding.
4. Dependent airspace opacities in both lungs. Pneumonia is not
excluded. Small bilateral pleural effusions.
5. There are a few scattered small cystic lesions in the liver,
compatible with hepatic cysts or biliary hamartomas. Degenerative
disc disease at L5-S1.

## 2019-04-10 ENCOUNTER — Emergency Department (HOSPITAL_COMMUNITY): Payer: Self-pay

## 2019-04-10 ENCOUNTER — Other Ambulatory Visit (HOSPITAL_COMMUNITY): Payer: Self-pay

## 2019-04-10 ENCOUNTER — Observation Stay (HOSPITAL_COMMUNITY): Payer: Self-pay

## 2019-04-10 ENCOUNTER — Encounter (HOSPITAL_COMMUNITY): Payer: Self-pay | Admitting: Radiology

## 2019-04-10 ENCOUNTER — Other Ambulatory Visit: Payer: Self-pay

## 2019-04-10 ENCOUNTER — Inpatient Hospital Stay (HOSPITAL_COMMUNITY)
Admission: EM | Admit: 2019-04-10 | Discharge: 2019-04-14 | DRG: 439 | Disposition: A | Discharge status: Home / Self Care | Payer: Self-pay | Attending: Internal Medicine | Admitting: Internal Medicine

## 2019-04-10 DIAGNOSIS — Z791 Long term (current) use of non-steroidal anti-inflammatories (NSAID): Secondary | ICD-10-CM

## 2019-04-10 DIAGNOSIS — K852 Alcohol induced acute pancreatitis without necrosis or infection: Principal | ICD-10-CM | POA: Diagnosis present

## 2019-04-10 DIAGNOSIS — B1920 Unspecified viral hepatitis C without hepatic coma: Secondary | ICD-10-CM | POA: Diagnosis present

## 2019-04-10 DIAGNOSIS — G629 Polyneuropathy, unspecified: Secondary | ICD-10-CM | POA: Diagnosis present

## 2019-04-10 DIAGNOSIS — F17210 Nicotine dependence, cigarettes, uncomplicated: Secondary | ICD-10-CM | POA: Diagnosis present

## 2019-04-10 DIAGNOSIS — E876 Hypokalemia: Secondary | ICD-10-CM | POA: Diagnosis present

## 2019-04-10 DIAGNOSIS — F17200 Nicotine dependence, unspecified, uncomplicated: Secondary | ICD-10-CM | POA: Diagnosis present

## 2019-04-10 DIAGNOSIS — K859 Acute pancreatitis without necrosis or infection, unspecified: Secondary | ICD-10-CM | POA: Diagnosis present

## 2019-04-10 DIAGNOSIS — K7689 Other specified diseases of liver: Secondary | ICD-10-CM | POA: Diagnosis present

## 2019-04-10 DIAGNOSIS — G894 Chronic pain syndrome: Secondary | ICD-10-CM | POA: Diagnosis present

## 2019-04-10 DIAGNOSIS — Z9049 Acquired absence of other specified parts of digestive tract: Secondary | ICD-10-CM

## 2019-04-10 DIAGNOSIS — F1021 Alcohol dependence, in remission: Secondary | ICD-10-CM

## 2019-04-10 DIAGNOSIS — R7989 Other specified abnormal findings of blood chemistry: Secondary | ICD-10-CM | POA: Diagnosis present

## 2019-04-10 DIAGNOSIS — K529 Noninfective gastroenteritis and colitis, unspecified: Secondary | ICD-10-CM | POA: Diagnosis present

## 2019-04-10 DIAGNOSIS — E871 Hypo-osmolality and hyponatremia: Secondary | ICD-10-CM | POA: Diagnosis present

## 2019-04-10 DIAGNOSIS — Z8249 Family history of ischemic heart disease and other diseases of the circulatory system: Secondary | ICD-10-CM

## 2019-04-10 DIAGNOSIS — Z66 Do not resuscitate: Secondary | ICD-10-CM | POA: Diagnosis present

## 2019-04-10 DIAGNOSIS — Z560 Unemployment, unspecified: Secondary | ICD-10-CM

## 2019-04-10 DIAGNOSIS — Z820 Family history of epilepsy and other diseases of the nervous system: Secondary | ICD-10-CM

## 2019-04-10 DIAGNOSIS — Z23 Encounter for immunization: Secondary | ICD-10-CM

## 2019-04-10 DIAGNOSIS — Z716 Tobacco abuse counseling: Secondary | ICD-10-CM

## 2019-04-10 DIAGNOSIS — Z1159 Encounter for screening for other viral diseases: Secondary | ICD-10-CM

## 2019-04-10 DIAGNOSIS — I82891 Chronic embolism and thrombosis of other specified veins: Secondary | ICD-10-CM | POA: Diagnosis present

## 2019-04-10 DIAGNOSIS — R945 Abnormal results of liver function studies: Secondary | ICD-10-CM | POA: Diagnosis present

## 2019-04-10 DIAGNOSIS — Z79899 Other long term (current) drug therapy: Secondary | ICD-10-CM

## 2019-04-10 DIAGNOSIS — K760 Fatty (change of) liver, not elsewhere classified: Secondary | ICD-10-CM | POA: Diagnosis present

## 2019-04-10 LAB — COMPREHENSIVE METABOLIC PANEL
ALT: 86 U/L — ABNORMAL HIGH (ref 0–44)
AST: 172 U/L — ABNORMAL HIGH (ref 15–41)
Albumin: 3.4 g/dL — ABNORMAL LOW (ref 3.5–5.0)
Alkaline Phosphatase: 103 U/L (ref 38–126)
Anion gap: 25 — ABNORMAL HIGH (ref 5–15)
BUN: <5 mg/dL — ABNORMAL LOW (ref 6–20)
CO2: 25 mmol/L (ref 22–32)
Calcium: 9 mg/dL (ref 8.9–10.3)
Chloride: 84 mmol/L — ABNORMAL LOW (ref 98–111)
Creatinine, Ser: 0.91 mg/dL (ref 0.61–1.24)
GFR calc Af Amer: >60 mL/min (ref >60)
GFR calc non Af Amer: >60 mL/min (ref >60)
Glucose, Bld: 92 mg/dL (ref 70–99)
Potassium: 4.2 mmol/L (ref 3.5–5.1)
Sodium: 134 mmol/L — ABNORMAL LOW (ref 135–145)
Total Bilirubin: 3.9 mg/dL — ABNORMAL HIGH (ref 0.3–1.2)
Total Protein: 8.4 g/dL — ABNORMAL HIGH (ref 6.5–8.1)

## 2019-04-10 LAB — URINALYSIS, ROUTINE W REFLEX MICROSCOPIC
Glucose, UA: NEGATIVE mg/dL
Hgb urine dipstick: NEGATIVE
Ketones, ur: 20 mg/dL — AB
Leukocytes,Ua: NEGATIVE
Nitrite: NEGATIVE
Protein, ur: 100 mg/dL — AB
Specific Gravity, Urine: 1.031 — ABNORMAL HIGH (ref 1.005–1.030)
pH: 6 (ref 5.0–8.0)

## 2019-04-10 LAB — CBC
HCT: 48.3 % (ref 39.0–52.0)
Hemoglobin: 17.3 g/dL — ABNORMAL HIGH (ref 13.0–17.0)
MCH: 35.5 pg — ABNORMAL HIGH (ref 26.0–34.0)
MCHC: 35.8 g/dL (ref 30.0–36.0)
MCV: 99 fL (ref 80.0–100.0)
Platelets: 188 10*3/uL (ref 150–400)
RBC: 4.88 MIL/uL (ref 4.22–5.81)
RDW: 11.3 % — ABNORMAL LOW (ref 11.5–15.5)
WBC: 8.9 10*3/uL (ref 4.0–10.5)
nRBC: 0 % (ref 0.0–0.2)

## 2019-04-10 LAB — TROPONIN I: Troponin I: <0.03 ng/mL (ref <0.03)

## 2019-04-10 LAB — LACTATE DEHYDROGENASE: LDH: 200 U/L — ABNORMAL HIGH (ref 98–192)

## 2019-04-10 LAB — LIPASE, BLOOD: Lipase: 801 U/L — ABNORMAL HIGH (ref 11–51)

## 2019-04-10 LAB — SARS CORONAVIRUS 2 BY RT PCR (HOSPITAL ORDER, PERFORMED IN ~~LOC~~ HOSPITAL LAB): SARS Coronavirus 2: NEGATIVE

## 2019-04-10 MED ORDER — FOLIC ACID 5 MG/ML IJ SOLN
1.0000 mg | Freq: Every day | INTRAMUSCULAR | Status: DC
  Administered 2019-04-10 – 2019-04-14 (×5): 1 mg via INTRAVENOUS
  Filled 2019-04-10 (×7): qty 0.2

## 2019-04-10 MED ORDER — GADOBUTROL 1 MMOL/ML IV SOLN
6.0000 mL | Freq: Once | INTRAVENOUS | Status: AC | PRN
  Administered 2019-04-10: 6 mL via INTRAVENOUS

## 2019-04-10 MED ORDER — ONDANSETRON HCL 4 MG PO TABS: 4.0000 mg | ORAL_TABLET | Freq: Four times a day (QID) | ORAL | Status: DC | PRN

## 2019-04-10 MED ORDER — ENOXAPARIN SODIUM 40 MG/0.4ML ~~LOC~~ SOLN
40.0000 mg | SUBCUTANEOUS | Status: DC
  Administered 2019-04-10 – 2019-04-13 (×4): 40 mg via SUBCUTANEOUS
  Filled 2019-04-10 (×4): qty 0.4

## 2019-04-10 MED ORDER — ONDANSETRON HCL 4 MG/2ML IJ SOLN
4.0000 mg | Freq: Once | INTRAMUSCULAR | Status: AC
  Administered 2019-04-10: 06:00:00 4 mg via INTRAVENOUS
  Filled 2019-04-10: qty 2

## 2019-04-10 MED ORDER — LACTATED RINGERS IV SOLN
INTRAVENOUS | Status: DC
  Administered 2019-04-10 (×2): via INTRAVENOUS

## 2019-04-10 MED ORDER — GABAPENTIN 300 MG PO CAPS
300.0000 mg | ORAL_CAPSULE | Freq: Three times a day (TID) | ORAL | Status: DC
  Administered 2019-04-10 – 2019-04-14 (×13): 300 mg via ORAL
  Filled 2019-04-10 (×13): qty 1

## 2019-04-10 MED ORDER — PROMETHAZINE HCL 25 MG/ML IJ SOLN
12.5000 mg | Freq: Once | INTRAMUSCULAR | Status: AC
  Administered 2019-04-10: 12.5 mg via INTRAVENOUS
  Filled 2019-04-10: qty 1

## 2019-04-10 MED ORDER — HYDROMORPHONE HCL 1 MG/ML IJ SOLN
0.5000 mg | Freq: Once | INTRAMUSCULAR | Status: AC
  Administered 2019-04-10: 0.5 mg via INTRAVENOUS
  Filled 2019-04-10: qty 1

## 2019-04-10 MED ORDER — HYDROMORPHONE HCL 1 MG/ML IJ SOLN
1.0000 mg | Freq: Once | INTRAMUSCULAR | Status: AC
  Administered 2019-04-10: 07:00:00 1 mg via INTRAVENOUS
  Filled 2019-04-10: qty 1

## 2019-04-10 MED ORDER — ONDANSETRON HCL 4 MG/2ML IJ SOLN: 4.0000 mg | Freq: Once | INTRAMUSCULAR | Status: DC

## 2019-04-10 MED ORDER — IOHEXOL 300 MG/ML  SOLN
100.0000 mL | Freq: Once | INTRAMUSCULAR | Status: AC | PRN
  Administered 2019-04-10: 08:00:00 100 mL via INTRAVENOUS

## 2019-04-10 MED ORDER — ONDANSETRON HCL 4 MG/2ML IJ SOLN: 4.0000 mg | Freq: Once | INTRAMUSCULAR | Status: DC | PRN

## 2019-04-10 MED ORDER — HYDROMORPHONE HCL 1 MG/ML IJ SOLN
1.0000 mg | Freq: Once | INTRAMUSCULAR | Status: AC
  Administered 2019-04-10: 06:00:00 1 mg via INTRAVENOUS
  Filled 2019-04-10: qty 1

## 2019-04-10 MED ORDER — THIAMINE HCL 100 MG/ML IJ SOLN
100.0000 mg | Freq: Every day | INTRAMUSCULAR | Status: DC
  Administered 2019-04-10 – 2019-04-14 (×5): 100 mg via INTRAVENOUS
  Filled 2019-04-10 (×6): qty 2

## 2019-04-10 MED ORDER — ACETAMINOPHEN 650 MG RE SUPP: 650.0000 mg | Freq: Four times a day (QID) | RECTAL | Status: DC | PRN

## 2019-04-10 MED ORDER — ACETAMINOPHEN 325 MG PO TABS
650.0000 mg | ORAL_TABLET | Freq: Four times a day (QID) | ORAL | Status: DC | PRN
  Administered 2019-04-11 (×2): 650 mg via ORAL
  Filled 2019-04-10: qty 2

## 2019-04-10 MED ORDER — ONDANSETRON HCL 4 MG/2ML IJ SOLN
4.0000 mg | Freq: Four times a day (QID) | INTRAMUSCULAR | Status: DC | PRN
  Administered 2019-04-10 – 2019-04-13 (×3): 4 mg via INTRAVENOUS
  Filled 2019-04-10 (×3): qty 2

## 2019-04-10 MED ORDER — MORPHINE SULFATE (PF) 2 MG/ML IV SOLN
2.0000 mg | INTRAVENOUS | Status: DC | PRN
  Administered 2019-04-10 (×3): 2 mg via INTRAVENOUS
  Filled 2019-04-10 (×3): qty 1

## 2019-04-10 NOTE — ED Triage Notes (Signed)
Abdominal pain with N&V similar to Jan. Episode of pancreatitis.  Pt states pain is 12 of 10. Pt states the pain is preventing sleep
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
## 2019-04-10 NOTE — Progress Notes (Signed)
Wesley [name] [id number]  Code Status: DNR  Admission Data: 04/10/2019 4:21 PM  Attending Provider: [name]  OFB:PZWC, Wesley Gaster, MD  Consults/ Treatment Team: Treatment Team:  Lavena Bullion, DO  Wesley [name] is a 53 y.o. male patient admitted from ED awake, alert - oriented X 4 - no acute distress noted. VSS - Blood pressure 132/89, pulse (!) 101, temperature 98.6 F (37 C), temperature source Oral, resp. rate 16, height 5' 8" (1.727 m), weight 56.5 kg, SpO2 99 %. no c/o shortness of breath, no c/o chest pain. Orientation to room, and floor completed with information packet given to patient/family. Admission INP armband ID verified with patient/family, and in place.  Fall assessment complete, with patient and family able to verbalize understanding of risk associated with falls, and verbalized understanding to call nsg before up out of bed. Call light within reach, patient able to voice, and demonstrate understanding. Skin, clean-dry- intact without evidence of bruising, or skin tears.  No evidence of skin break down noted on exam.  ?  Will cont to eval and treat per MD orders.  Melonie Florida, RN
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
## 2019-04-10 NOTE — ED Notes (Signed)
Patient transported to CT
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
## 2019-04-10 NOTE — ED Notes (Signed)
Pt returned to room from CT.

## 2019-04-10 NOTE — ED Notes (Signed)
Attempted report x1.

## 2019-04-10 NOTE — ED Provider Notes (Signed)
The patient is a 53 year old male with recurrent pancreatitis who presents today after having several days of worsening abdominal discomfort and inability to tolerate oral food or fluids.  He has a diffusely tender abdomen but much more so in the upper abdomen.  He has a normal white blood cell count, his electrolytes show some elevated liver function tests and an elevated lipase at over 800 as well as an elevated bilirubin at over 3-1/2.  His CT scan has been performed  He will need to be admitted to the hospital for ongoing fairly severe pancreatitis.  Unassigned admission has been paged, the patient has had 2 doses of hydromorphone with minimal improvement as well as IV fluids, he is receiving Phenergan for nausea.  He does have a slight prolonged QT on his EKG.   EKG Interpretation  Date/Time:  Sunday Apr 10 2019 05:39:40 EDT Ventricular Rate:  105 PR Interval:    QRS Duration: 99 QT Interval:  379 QTC Calculation: 501 R Axis:   70 Text Interpretation:  Sinus tachycardia with irregular rate Biatrial enlargement Minimal ST depression, diffuse leads Prolonged QT interval Abnormal ekg Confirmed by Ripley Fraise [phone]) on 04/10/2019 6:06:04 AM      D/w Dr. [name]  With hospitalist service - will admit  Final diagnoses:  Acute pancreatitis, unspecified complication status, unspecified pancreatitis type      Noemi Chapel, MD 04/10/19 [phone]
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
## 2019-04-10 NOTE — ED Notes (Signed)
ED Provider at bedside.

## 2019-04-10 NOTE — ED Notes (Signed)
Nurse drawing labs.

## 2019-04-11 DIAGNOSIS — R935 Abnormal findings on diagnostic imaging of other abdominal regions, including retroperitoneum: Secondary | ICD-10-CM

## 2019-04-11 DIAGNOSIS — R945 Abnormal results of liver function studies: Secondary | ICD-10-CM

## 2019-04-11 DIAGNOSIS — F1021 Alcohol dependence, in remission: Secondary | ICD-10-CM

## 2019-04-11 DIAGNOSIS — K859 Acute pancreatitis without necrosis or infection, unspecified: Secondary | ICD-10-CM

## 2019-04-11 DIAGNOSIS — R509 Fever, unspecified: Secondary | ICD-10-CM

## 2019-04-11 LAB — CBC
HCT: 40.8 % (ref 39.0–52.0)
Hemoglobin: 14.9 g/dL (ref 13.0–17.0)
MCH: 35.1 pg — ABNORMAL HIGH (ref 26.0–34.0)
MCHC: 36.5 g/dL — ABNORMAL HIGH (ref 30.0–36.0)
MCV: 96 fL (ref 80.0–100.0)
Platelets: 159 10*3/uL (ref 150–400)
RBC: 4.25 MIL/uL (ref 4.22–5.81)
RDW: 11.5 % (ref 11.5–15.5)
WBC: 14.8 10*3/uL — ABNORMAL HIGH (ref 4.0–10.5)
nRBC: 0 % (ref 0.0–0.2)

## 2019-04-11 LAB — BASIC METABOLIC PANEL
Anion gap: 17 — ABNORMAL HIGH (ref 5–15)
BUN: <5 mg/dL — ABNORMAL LOW (ref 6–20)
CO2: 31 mmol/L (ref 22–32)
Calcium: 8.3 mg/dL — ABNORMAL LOW (ref 8.9–10.3)
Chloride: 85 mmol/L — ABNORMAL LOW (ref 98–111)
Creatinine, Ser: 0.75 mg/dL (ref 0.61–1.24)
GFR calc Af Amer: >60 mL/min (ref >60)
GFR calc non Af Amer: >60 mL/min (ref >60)
Glucose, Bld: 51 mg/dL — ABNORMAL LOW (ref 70–99)
Potassium: 2.2 mmol/L — CL (ref 3.5–5.1)
Sodium: 133 mmol/L — ABNORMAL LOW (ref 135–145)

## 2019-04-11 LAB — COMPREHENSIVE METABOLIC PANEL
ALT: 48 U/L — ABNORMAL HIGH (ref 0–44)
AST: 82 U/L — ABNORMAL HIGH (ref 15–41)
Albumin: 2.8 g/dL — ABNORMAL LOW (ref 3.5–5.0)
Alkaline Phosphatase: 85 U/L (ref 38–126)
Anion gap: 17 — ABNORMAL HIGH (ref 5–15)
BUN: <5 mg/dL — ABNORMAL LOW (ref 6–20)
CO2: 33 mmol/L — ABNORMAL HIGH (ref 22–32)
Calcium: 8.7 mg/dL — ABNORMAL LOW (ref 8.9–10.3)
Chloride: 83 mmol/L — ABNORMAL LOW (ref 98–111)
Creatinine, Ser: 0.82 mg/dL (ref 0.61–1.24)
GFR calc Af Amer: >60 mL/min (ref >60)
GFR calc non Af Amer: >60 mL/min (ref >60)
Glucose, Bld: 87 mg/dL (ref 70–99)
Potassium: 2.3 mmol/L — CL (ref 3.5–5.1)
Sodium: 133 mmol/L — ABNORMAL LOW (ref 135–145)
Total Bilirubin: 2.7 mg/dL — ABNORMAL HIGH (ref 0.3–1.2)
Total Protein: 6.7 g/dL (ref 6.5–8.1)

## 2019-04-11 LAB — BILIRUBIN, DIRECT: Bilirubin, Direct: 1 mg/dL — ABNORMAL HIGH (ref 0.0–0.2)

## 2019-04-11 LAB — MAGNESIUM: Magnesium: 1.2 mg/dL — ABNORMAL LOW (ref 1.7–2.4)

## 2019-04-11 LAB — TRIGLYCERIDES: Triglycerides: 74 mg/dL (ref <150)

## 2019-04-11 MED ORDER — MAGNESIUM SULFATE 2 GM/50ML IV SOLN
2.0000 g | Freq: Once | INTRAVENOUS | Status: AC
  Administered 2019-04-11: 10:00:00 2 g via INTRAVENOUS
  Filled 2019-04-11: qty 50

## 2019-04-11 MED ORDER — FENTANYL CITRATE (PF) 100 MCG/2ML IJ SOLN
25.0000 ug | INTRAMUSCULAR | Status: DC | PRN
  Administered 2019-04-12 (×2): 25 ug via INTRAVENOUS
  Filled 2019-04-11 (×2): qty 2

## 2019-04-11 MED ORDER — POTASSIUM CHLORIDE 10 MEQ/100ML IV SOLN: 10.0000 meq | INTRAVENOUS | Status: DC

## 2019-04-11 MED ORDER — POTASSIUM CHLORIDE 10 MEQ/100ML IV SOLN
10.0000 meq | INTRAVENOUS | Status: AC
  Administered 2019-04-11 (×4): 10 meq via INTRAVENOUS
  Filled 2019-04-11 (×3): qty 100

## 2019-04-11 MED ORDER — LACTATED RINGERS IV SOLN: INTRAVENOUS | Status: DC

## 2019-04-11 MED ORDER — LACTATED RINGERS IV SOLN
INTRAVENOUS | Status: DC
  Administered 2019-04-11 – 2019-04-12 (×2): via INTRAVENOUS
  Administered 2019-04-12: 1000 mL via INTRAVENOUS
  Administered 2019-04-12 – 2019-04-13 (×2): via INTRAVENOUS
  Administered 2019-04-13: 12:00:00 1000 mL via INTRAVENOUS
  Administered 2019-04-14: 08:00:00 via INTRAVENOUS

## 2019-04-11 MED ORDER — POTASSIUM CHLORIDE 10 MEQ/100ML IV SOLN
10.0000 meq | INTRAVENOUS | Status: AC
  Administered 2019-04-11 – 2019-04-12 (×4): 10 meq via INTRAVENOUS
  Filled 2019-04-11 (×4): qty 100

## 2019-04-11 MED ORDER — POTASSIUM CHLORIDE 10 MEQ/100ML IV SOLN
INTRAVENOUS | Status: AC
  Administered 2019-04-11: 10 meq via INTRAVENOUS
  Filled 2019-04-11: qty 100

## 2019-04-11 MED ORDER — POTASSIUM CHLORIDE CRYS ER 20 MEQ PO TBCR
40.0000 meq | EXTENDED_RELEASE_TABLET | Freq: Once | ORAL | Status: AC
  Administered 2019-04-11: 23:00:00 40 meq via ORAL
  Filled 2019-04-11: qty 2

## 2019-04-11 NOTE — Progress Notes (Signed)
Notified by lab 2152 K+ 2.2. Paged NP at 2158. Ordered K+ (oral and IV) administered. Will continue to monitor.  Patient complained of LUQ pain, however, stated PRN morphine makes him feel altered and not like himself. Paged NP for alternative pain medicine; PRN fentanyl ordered.   During night patient complained of upset stomach and nausea. 1 loose stool. PRN Zofran and Fentanyl given as ordered.
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
## 2019-04-12 DIAGNOSIS — E876 Hypokalemia: Secondary | ICD-10-CM

## 2019-04-12 DIAGNOSIS — R1013 Epigastric pain: Secondary | ICD-10-CM

## 2019-04-12 LAB — BASIC METABOLIC PANEL
Anion gap: 10 (ref 5–15)
BUN: <5 mg/dL — ABNORMAL LOW (ref 6–20)
CO2: 29 mmol/L (ref 22–32)
Calcium: 8.1 mg/dL — ABNORMAL LOW (ref 8.9–10.3)
Chloride: 93 mmol/L — ABNORMAL LOW (ref 98–111)
Creatinine, Ser: 0.82 mg/dL (ref 0.61–1.24)
GFR calc Af Amer: >60 mL/min (ref >60)
GFR calc non Af Amer: >60 mL/min (ref >60)
Glucose, Bld: 96 mg/dL (ref 70–99)
Potassium: 2.7 mmol/L — CL (ref 3.5–5.1)
Sodium: 132 mmol/L — ABNORMAL LOW (ref 135–145)

## 2019-04-12 LAB — CBC
HCT: 40.9 % (ref 39.0–52.0)
Hemoglobin: 15 g/dL (ref 13.0–17.0)
MCH: 35.3 pg — ABNORMAL HIGH (ref 26.0–34.0)
MCHC: 36.7 g/dL — ABNORMAL HIGH (ref 30.0–36.0)
MCV: 96.2 fL (ref 80.0–100.0)
Platelets: 185 10*3/uL (ref 150–400)
RBC: 4.25 MIL/uL (ref 4.22–5.81)
RDW: 11.3 % — ABNORMAL LOW (ref 11.5–15.5)
WBC: 10.5 10*3/uL (ref 4.0–10.5)
nRBC: 0 % (ref 0.0–0.2)

## 2019-04-12 LAB — MAGNESIUM: Magnesium: 1.6 mg/dL — ABNORMAL LOW (ref 1.7–2.4)

## 2019-04-12 LAB — C-REACTIVE PROTEIN: CRP: 22 mg/dL — ABNORMAL HIGH (ref <1.0)

## 2019-04-12 LAB — SEDIMENTATION RATE: Sed Rate: 24 mm/hr — ABNORMAL HIGH (ref 0–16)

## 2019-04-12 MED ORDER — POTASSIUM CHLORIDE CRYS ER 20 MEQ PO TBCR
40.0000 meq | EXTENDED_RELEASE_TABLET | Freq: Once | ORAL | Status: AC
  Administered 2019-04-12: 40 meq via ORAL
  Filled 2019-04-12: qty 2

## 2019-04-12 MED ORDER — MAGNESIUM SULFATE 2 GM/50ML IV SOLN
2.0000 g | Freq: Once | INTRAVENOUS | Status: AC
  Administered 2019-04-12: 2 g via INTRAVENOUS
  Filled 2019-04-12: qty 50

## 2019-04-12 MED ORDER — POTASSIUM CHLORIDE CRYS ER 20 MEQ PO TBCR
40.0000 meq | EXTENDED_RELEASE_TABLET | Freq: Once | ORAL | Status: AC
  Administered 2019-04-12: 15:00:00 40 meq via ORAL
  Filled 2019-04-12: qty 2

## 2019-04-12 NOTE — Progress Notes (Signed)
CRITICAL VALUE ALERT  Critical Value:  K+ 2.7  Date & Time Notied:  04/12/2019; 08:46  Provider Notified: Dr. Eliseo Squires   Orders Received/Actions taken:
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
## 2019-04-12 NOTE — Progress Notes (Signed)
Patient reports a total of 3 episodes of diarrhea during [place] and 5 overnight. Patient minimal/poor sleep during night due to frequency of BM. onset of persistent/frequent diarrhea during IV and oral K+ administration.   During shift assessment pt. stated only ate 3 bites of dinner (spaghetti) before having to have BM (diarrhea).   Patient report better intake of breakfast (grits/sherbert) and lunch (chicken broth/sherbert), however still had diarrhea. Indicated persisitent pain LUQ and LLQ, offered pain med, patient state PRN 25 mcg Fentanyl ineffective.   Paged NP Schorr at [place], pt. state no change in pain with PRN fentanyl 25 mcg. Morphine makes him feel altered. Also informed of persistent diarrhea. Fentanyl 50 mcg ordered. Patient asleep when attempt to administer and later described no pain.   Patient slept approx. 3 hours during night.

## 2019-04-12 NOTE — Progress Notes (Signed)
Daily Rounding Note  04/12/2019, 11:06 AM  LOS: 1 [place]   SUBJECTIVE:   Chief complaint:  Acute pancreatitis.  HCV +.   Abdominal pain accompanied by nausea overnight and this AM.  Better after Fentanyl and Zofran.  Tolerated grits and sherbet after this.     OBJECTIVE:         Vital signs in last 24 hours:    Temp:  [98.3 F (36.8 C)-101 F (38.3 C)] 99.5 F (37.5 C) (05/12 0608) Pulse Rate:  [87-103] 92 (05/12 0608) Resp:  [18] 18 (05/11 1430) BP: (126-130)/(81-90) 130/90 (05/12 0608) SpO2:  [92 %-96 %] 92 % (05/12 0608) Last BM Date: 04/11/19 Filed Weights   04/10/19 0542 04/10/19 1139  Weight: 65.8 kg 56.5 kg   General: pleasant, comfortable.  Not toxic or ill looking   Heart: RRR Chest: clear bil.  No SOB or cough Abdomen: soft, ND.  Minor LUQ tenderness.    Extremities: no CCE Neuro/Psych:  Oriented x 3.  Fully alert, conversant.  No deficits.    Intake/Output from previous [place]: 05/11 0701 - 05/12 0700 In: 222 [P.O.:222] Out: -   Intake/Output this shift: Total I/O In: 350 [P.O.:300; IV Piggyback:50] Out: -   Lab Results: Recent Labs    04/10/19 0608 04/11/19 0449 04/12/19 0849  WBC 8.9 14.8* 10.5  HGB 17.3* 14.9 15.0  HCT 48.3 40.8 40.9  PLT 188 159 185   BMET Recent Labs    04/11/19 0449 04/11/19 1830 04/12/19 0751  NA 133* 133* 132*  K 2.3* 2.2* 2.7*  CL 83* 85* 93*  CO2 33* 31 29  GLUCOSE 87 51* 96  BUN <5* <5* <5*  CREATININE 0.82 0.75 0.82  CALCIUM 8.7* 8.3* 8.1*   LFT Recent Labs    04/10/19 0608 04/11/19 0449  PROT 8.4* 6.7  ALBUMIN 3.4* 2.8*  AST 172* 82*  ALT 86* 48*  ALKPHOS 103 85  BILITOT 3.9* 2.7*  BILIDIR  --  1.0*   PT/INR No results for input(s): LABPROT, INR in the last 72 hours. Hepatitis Panel No results for input(s): HEPBSAG, HCVAB, HEPAIGM, HEPBIGM in the last 72 hours.  Studies/Results: Mr 3d Recon At Scanner  Result Date: 04/10/2019 CLINICAL  DATA:  Pancreatitis.  Epigastric and left abdominal pain. EXAM: MRI ABDOMEN WITHOUT AND WITH CONTRAST (INCLUDING MRCP) TECHNIQUE: Multiplanar multisequence MR imaging of the abdomen was performed both before and after the administration of intravenous contrast. Heavily T2-weighted images of the biliary and pancreatic ducts were obtained, and three-dimensional MRCP images were rendered by post processing. CONTRAST:  6 cc Gadavist COMPARISON:  CT abdomen 04/10/2019.  MRI dated 12/08/2018. FINDINGS: Lower chest: Trace left pleural effusion. Hepatobiliary: Small T2 hyperintense lesions in the liver favor cysts or small biliary hemangiomas. The common bile duct measures up to 0.7 cm in diameter and the common bile duct 0.8 cm in diameter, with a tapered appearance of the CBD the vicinity of the pancreatic head probably related to edema in the pancreatic head. No well-defined filling defect in the biliary tree to suggest choledocholithiasis. The gallbladder is absent. No significant abnormal enhancing hepatic lesion. Pancreas: There is diffuse peripancreatic edema along with a 4.9 by 2.3 cm region of indistinct hypoenhancement in the pancreatic tail which is new compared to the 12/08/2018 exam. The dorsal pancreatic duct is not dilated. Small acute peripancreatic fluid collection tracking into the lesser sac. No pseudocyst identified. Spleen:  Unremarkable Adrenals/Urinary Tract:  Unremarkable  Stomach/Bowel: There is likely some secondary inflammation of the descending duodenum. Vascular/Lymphatic: Aortoiliac atherosclerotic vascular disease. Reactive lymph nodes along the porta hepatis. Other: Mild perisplenic ascites and mild ascites along the porta hepatis. Musculoskeletal: Unremarkable IMPRESSION: 1. Acute pancreatitis with a 4.9 by 2.3 cm ill-defined region of hypoenhancement in the pancreatic tail which, although not sharply defined, could represent incipient pancreatic necrosis or early phlegmon/abscess formation.  Mild prominence of the pancreatic contour including the pancreatic head. Narrowing of the distal CBD is probably extrinsic due to the pancreatic head edema, and no filling defect is identified in the biliary tree. 2. Small acute peripancreatic fluid collection in the lesser sac. Trace left pleural effusion. Edema noted along the porta hepatis with mild reactive adenopathy. Mild perisplenic ascites. 3.  Aortic Atherosclerosis (ICD10-I70.0). Electronically Signed   By: Van Clines M.D.   On: 04/10/2019 17:09   [name]  Result Date: 04/10/2019 CLINICAL DATA:  Pancreatitis.  Epigastric and left abdominal pain. EXAM: MRI ABDOMEN WITHOUT AND WITH CONTRAST (INCLUDING MRCP) TECHNIQUE: Multiplanar multisequence MR imaging of the abdomen was performed both before and after the administration of intravenous contrast. Heavily T2-weighted images of the biliary and pancreatic ducts were obtained, and three-dimensional MRCP images were rendered by post processing. CONTRAST:  6 cc Gadavist COMPARISON:  CT abdomen 04/10/2019.  MRI dated 12/08/2018. FINDINGS: Lower chest: Trace left pleural effusion. Hepatobiliary: Small T2 hyperintense lesions in the liver favor cysts or small biliary hemangiomas. The common bile duct measures up to 0.7 cm in diameter and the common bile duct 0.8 cm in diameter, with a tapered appearance of the CBD the vicinity of the pancreatic head probably related to edema in the pancreatic head. No well-defined filling defect in the biliary tree to suggest choledocholithiasis. The gallbladder is absent. No significant abnormal enhancing hepatic lesion. Pancreas: There is diffuse peripancreatic edema along with a 4.9 by 2.3 cm region of indistinct hypoenhancement in the pancreatic tail which is new compared to the 12/08/2018 exam. The dorsal pancreatic duct is not dilated. Small acute peripancreatic fluid collection tracking into the lesser sac. No pseudocyst identified. Spleen:   Unremarkable Adrenals/Urinary Tract:  Unremarkable Stomach/Bowel: There is likely some secondary inflammation of the descending duodenum. Vascular/Lymphatic: Aortoiliac atherosclerotic vascular disease. Reactive lymph nodes along the porta hepatis. Other: Mild perisplenic ascites and mild ascites along the porta hepatis. Musculoskeletal: Unremarkable IMPRESSION: 1. Acute pancreatitis with a 4.9 by 2.3 cm ill-defined region of hypoenhancement in the pancreatic tail which, although not sharply defined, could represent incipient pancreatic necrosis or early phlegmon/abscess formation. Mild prominence of the pancreatic contour including the pancreatic head. Narrowing of the distal CBD is probably extrinsic due to the pancreatic head edema, and no filling defect is identified in the biliary tree. 2. Small acute peripancreatic fluid collection in the lesser sac. Trace left pleural effusion. Edema noted along the porta hepatis with mild reactive adenopathy. Mild perisplenic ascites. 3.  Aortic Atherosclerosis (ICD10-I70.0). Electronically Signed   By: Van Clines M.D.   On: 04/10/2019 17:09   Scheduled Meds: . enoxaparin (LOVENOX) injection  40 mg Subcutaneous Q24H  . folic acid  1 mg Intravenous Daily  . gabapentin  300 mg Oral TID  . potassium chloride  40 mEq Oral Once  . thiamine injection  100 mg Intravenous Daily   Continuous Infusions: . lactated ringers 125 mL/hr at 04/12/19 0643   PRN Meds:.acetaminophen **OR** acetaminophen, fentaNYL (SUBLIMAZE) injection, ondansetron **OR** ondansetron (ZOFRAN) IV   ASSESMENT:   *  Acute ETOH pancreatitis, recurrent.  ? Necrosis in TOP?  *   Chronic SV thrombosis.  Will not need AC for this.    *    Hypokalemia.  Persists.    *    HCV Ab +.  HCV quant in progress.   Fatty liver per CT.  (Likely) small hepatic cysts on CT; cysts vs small hemangiomas per MRI  *   Short segment swelling in left colon, ? Short segment colitis vs changes due to  adjacent pancreatitis?  PLAN   *  Continue supportive care.   LR still running at 125/hour, ? Continue vs reduce vs DC.      Azucena Freed  04/12/2019, 11:06 AM Phone [phone]

## 2019-04-13 DIAGNOSIS — K858 Other acute pancreatitis without necrosis or infection: Secondary | ICD-10-CM

## 2019-04-13 LAB — CBC
HCT: 36.4 % — ABNORMAL LOW (ref 39.0–52.0)
Hemoglobin: 13.4 g/dL (ref 13.0–17.0)
MCH: 35.4 pg — ABNORMAL HIGH (ref 26.0–34.0)
MCHC: 36.8 g/dL — ABNORMAL HIGH (ref 30.0–36.0)
MCV: 96 fL (ref 80.0–100.0)
Platelets: 196 10*3/uL (ref 150–400)
RBC: 3.79 MIL/uL — ABNORMAL LOW (ref 4.22–5.81)
RDW: 11.1 % — ABNORMAL LOW (ref 11.5–15.5)
WBC: 8.7 10*3/uL (ref 4.0–10.5)
nRBC: 0 % (ref 0.0–0.2)

## 2019-04-13 LAB — COMPREHENSIVE METABOLIC PANEL
ALT: 36 U/L (ref 0–44)
AST: 76 U/L — ABNORMAL HIGH (ref 15–41)
Albumin: 2.4 g/dL — ABNORMAL LOW (ref 3.5–5.0)
Alkaline Phosphatase: 87 U/L (ref 38–126)
Anion gap: 9 (ref 5–15)
BUN: <5 mg/dL — ABNORMAL LOW (ref 6–20)
CO2: 24 mmol/L (ref 22–32)
Calcium: 8 mg/dL — ABNORMAL LOW (ref 8.9–10.3)
Chloride: 96 mmol/L — ABNORMAL LOW (ref 98–111)
Creatinine, Ser: 0.71 mg/dL (ref 0.61–1.24)
GFR calc Af Amer: >60 mL/min (ref >60)
GFR calc non Af Amer: >60 mL/min (ref >60)
Glucose, Bld: 90 mg/dL (ref 70–99)
Potassium: 3.5 mmol/L (ref 3.5–5.1)
Sodium: 129 mmol/L — ABNORMAL LOW (ref 135–145)
Total Bilirubin: 2.2 mg/dL — ABNORMAL HIGH (ref 0.3–1.2)
Total Protein: 6.4 g/dL — ABNORMAL LOW (ref 6.5–8.1)

## 2019-04-13 LAB — CALCIUM, IONIZED: Calcium, Ionized, Serum: 4.7 mg/dL (ref 4.5–5.6)

## 2019-04-13 MED ORDER — FENTANYL CITRATE (PF) 100 MCG/2ML IJ SOLN
50.0000 ug | Freq: Once | INTRAMUSCULAR | Status: DC
  Filled 2019-04-13 (×2): qty 2

## 2019-04-13 MED ORDER — FENTANYL CITRATE (PF) 100 MCG/2ML IJ SOLN: 50.0000 ug | INTRAMUSCULAR | Status: DC | PRN

## 2019-04-13 MED ORDER — POTASSIUM CHLORIDE CRYS ER 20 MEQ PO TBCR
40.0000 meq | EXTENDED_RELEASE_TABLET | Freq: Once | ORAL | Status: AC
  Administered 2019-04-13: 09:00:00 40 meq via ORAL
  Filled 2019-04-13: qty 2

## 2019-04-13 NOTE — Consult Note (Addendum)
Daily Rounding Note  04/13/2019, 10:29 AM  LOS: 2 days   SUBJECTIVE:   Chief complaint:  Acute pancreatitis.  HCV +.   No pain meds yet today.  Pain is better. Hiccups resolved. No nausea.  Tolerating p.o. though not eating a whole lot. Multiple watery stools between 7 PM and 1 AM today.  Normally he has 1 or 2 formed stools daily. No fevers.   Diastolic BPs into 85O.    OBJECTIVE:         Vital signs in last 24 hours:    Temp:  [99 F (37.2 C)-99.1 F (37.3 C)] 99 F (37.2 C) (05/13 2774) Pulse Rate:  [81-88] 81 (05/13 0633) Resp:  [16-18] 18 (05/13 0633) BP: (129-137)/(89-93) 130/89 (05/13 0633) SpO2:  [94 %-97 %] 96 % (05/13 0633) Last BM Date: 04/13/19 Filed Weights   04/10/19 0542 04/10/19 1139  Weight: 65.8 kg 56.5 kg   General: NAD.  Sleeping comfortably. Heart: RRR. Chest: No labored breathing or cough. Abdomen: Soft.  Not distended.  Minimal upper abdominal tenderness. Extremities: No CCE. Neuro/Psych: Oriented x3.  No tremors, no limb weakness.  Fluid speech.  Intake/Output from previous [place]: 05/12 0701 - 05/13 0700 In: 2195 [P.O.:770; I.V.:1375; IV Piggyback:50] Out: -   Intake/Output this shift: Total I/O In: 120 [P.O.:120] Out: -   Lab Results: Recent Labs    04/11/19 0449 04/12/19 0849 04/13/19 0242  WBC 14.8* 10.5 8.7  HGB 14.9 15.0 13.4  HCT 40.8 40.9 36.4*  PLT 159 185 196   BMET Recent Labs    04/11/19 1830 04/12/19 0751 04/13/19 0242  NA 133* 132* 129*  K 2.2* 2.7* 3.5  CL 85* 93* 96*  CO2 _0 GLUCOSE 51* 96 90  BUN <5* <5* <5*  CREATININE 0.75 0.82 0.71  CALCIUM 8.3* 8.1* 8.0*   LFT Recent Labs    04/11/19 0449 04/13/19 0242  PROT 6.7 6.4*  ALBUMIN 2.8* 2.4*  AST 82* 76*  ALT 48* 36  ALKPHOS 85 87  BILITOT 2.7* 2.2*  BILIDIR 1.0*  --     Studies/Results: No results found.   Scheduled Meds: . enoxaparin (LOVENOX) injection  40 mg Subcutaneous  Q24H  . fentaNYL (SUBLIMAZE) injection  50 mcg Intravenous Once  . folic acid  1 mg Intravenous Daily  . gabapentin  300 mg Oral TID  . thiamine injection  100 mg Intravenous Daily   Continuous Infusions: . lactated ringers 50 mL/hr at 04/13/19 0959   PRN Meds:.acetaminophen **OR** acetaminophen, fentaNYL (SUBLIMAZE) injection, ondansetron **OR** ondansetron (ZOFRAN) IV    ASSESMENT:   *   Acute ETOH pancreatitis, recurrent.  ? Necrosis in TOP?  *   Chronic SV thrombosis.  Will not need AC for this.    *    Hypokalemia.  Persists.   *    Hyponatremia.  May be due to patient all effect of aggressive IV fluids.   *    HCV Ab +.  HCV quant still in progress.   Fatty liver per CT. (Likely) small hepatic cysts on CT; cysts vs small hemangiomas per MRI.  LFTs with improving PT/INR normal.  Last ETOH in 03/2019.     *   Short segment swelling left colon, ? Short segment colitis vs changes due to adjacent pancreatitis?  Diarrhea which only started happening last evening through early this morning.  Suspect this is a side effect of the multiple doses of  oral potassium he received yesterday.  No BM for the last 10 hours.   PLAN   *   Continue supportive care.  I see the lactated Ringer's has been reduced to 50 mL/hour.  *    If he has persistent diarrhea, will need to check for C. difficile though this is unlikely. In the meantime it may be better to replete his potassium with IV formulation.  *     Encouraged ambulation and p.o. intake. Recheck BMET in AM.  Await HCV quant results.      Azucena Freed  04/13/2019, 10:29 AM Phone [phone]

## 2019-04-13 NOTE — Progress Notes (Signed)
Patient able to walk in the hallway with no help approximately 400 feet. No acute distress noted, no complaints. Will continue to monitor.

## 2019-04-13 NOTE — TOC Initial Note (Signed)
Transition of Care [facility]) - Initial/Assessment Note    Patient Details  Name: Wesley [name] MRN: [id number] Date of Birth: 03-25-1966  [facility]) CM/SW Contact:    Carles Collet, RN Phone Number: 04/13/2019, 11:33 AM  Clinical Narrative:                Damaris Schooner w patient over the phone. He states that his listed PCP was who he went to when he had insurance and he hadnt been there for quite a while. We discussed [place]. Will have [facility] schedule appointment for hospital follow up. Will continue to follow for MATCH needs at DC.   Expected Discharge Plan: Home/Self Care Barriers to Discharge: Continued Medical Work up   Patient Goals and CMS Choice Patient states their goals for this hospitalization and ongoing recovery are:: to return home      Expected Discharge Plan and Services Expected Discharge Plan: Home/Self Care                                              Prior Living Arrangements/Services                       Activities of Daily Living      Permission Sought/Granted                  Emotional Assessment              Admission diagnosis:  Acute pancreatitis, unspecified complication status, unspecified pancreatitis type [K85.90] Patient Active Problem List   Diagnosis Date Noted  . Pancreatitis 04/10/2019  . Chronic thrombosis of splenic vein 04/10/2019  . Alcohol dependence in remission ([place]) 04/10/2019  . Acute gallstone pancreatitis 12/14/2018  . Malnutrition of moderate degree 12/13/2018  . Abnormal LFTs 12/08/2018  . Alcohol withdrawal ([place]) 02/10/2018  . Refeeding syndrome 02/09/2018  . Cholelithiasis 02/08/2018  . Hyponatremia 02/08/2018  . Chronic pain syndrome 02/08/2018  . Pancreatitis, recurrent 02/07/2018  . Alcohol abuse 02/07/2018  . Hypokalemia 02/07/2018  . SMOKER 08/07/2010  . EMPHYSEMATOUS BLEB 08/07/2010  . COPD 08/07/2010  . Pneumothorax 08/07/2010  . PALPITATIONS 08/07/2010  . SPONTANEOUS  PNEUMOTHORAX 08/07/2010   PCP:  Antony Blackbird, MD Pharmacy:   [id number] [place], [id number]: [phone]: [address]Phone: [phone]: [address]Phone: [phone]: [phone]    Social Determinants of Health (SDOH) Interventions    Readmission Risk Interventions No flowsheet data found.
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
## 2019-04-14 LAB — BASIC METABOLIC PANEL
Anion gap: 8 (ref 5–15)
BUN: <5 mg/dL — ABNORMAL LOW (ref 6–20)
CO2: 26 mmol/L (ref 22–32)
Calcium: 8.5 mg/dL — ABNORMAL LOW (ref 8.9–10.3)
Chloride: 95 mmol/L — ABNORMAL LOW (ref 98–111)
Creatinine, Ser: 0.67 mg/dL (ref 0.61–1.24)
GFR calc Af Amer: >60 mL/min (ref >60)
GFR calc non Af Amer: >60 mL/min (ref >60)
Glucose, Bld: 137 mg/dL — ABNORMAL HIGH (ref 70–99)
Potassium: 3.2 mmol/L — ABNORMAL LOW (ref 3.5–5.1)
Sodium: 129 mmol/L — ABNORMAL LOW (ref 135–145)

## 2019-04-14 MED ORDER — POTASSIUM CHLORIDE CRYS ER 20 MEQ PO TBCR
40.0000 meq | EXTENDED_RELEASE_TABLET | Freq: Once | ORAL | Status: AC
  Administered 2019-04-14: 40 meq via ORAL
  Filled 2019-04-14: qty 2

## 2019-04-14 MED ORDER — PNEUMOCOCCAL VAC POLYVALENT 25 MCG/0.5ML IJ INJ: 0.5000 mL | INJECTION | INTRAMUSCULAR | Status: DC

## 2019-04-14 MED ORDER — PNEUMOCOCCAL VAC POLYVALENT 25 MCG/0.5ML IJ INJ
0.5000 mL | INJECTION | INTRAMUSCULAR | Status: AC
  Administered 2019-04-14: 12:00:00 0.5 mL via INTRAMUSCULAR
  Filled 2019-04-14: qty 0.5

## 2019-04-14 NOTE — Progress Notes (Addendum)
Daily Rounding Note  04/14/2019, 10:29 AM  LOS: 3 days   SUBJECTIVE:   Chief complaint:  Acute pancreatitis with ? Necrosis at tail.    Tolerating solid food x 2 meals.  Appetite fair.  No nausea.  Slight lingering left lateral UQ discomfort.   Hospitalist working on pt discharge summary.    OBJECTIVE:         Vital signs in last 24 hours:    Temp:  [99.1 F (37.3 C)-99.4 F (37.4 C)] 99.3 F (37.4 C) (05/14 0507) Pulse Rate:  [79-84] 79 (05/14 0507) Resp:  [17-19] 17 (05/14 0507) BP: (121-126)/(86-91) 122/86 (05/14 0507) SpO2:  [98 %-100 %] 98 % (05/14 0507) Last BM Date: 04/13/19 Filed Weights   04/10/19 0542 04/10/19 1139  Weight: 65.8 kg 56.5 kg   General: looks well.     Heart: RRR Chest: clear bil.  No dyspnea.   Abdomen: soft, NT, ND.  Active BS  Extremities: no CCE Neuro/Psych:  Alert, oriented x 3.  No weakness, deficits or speech issues.    Intake/Output from previous [facility]: 05/13 0701 - 05/14 0700 In: 1798.6 [P.O.:668; I.V.:1130.6] Out: -   Intake/Output this shift: No intake/output data recorded.  Lab Results: Recent Labs    04/12/19 0849 04/13/19 0242  WBC 10.5 8.7  HGB 15.0 13.4  HCT 40.9 36.4*  PLT 185 196   BMET Recent Labs    04/12/19 0751 04/13/19 0242 04/14/19 0752  NA 132* 129* 129*  K 2.7* 3.5 3.2*  CL 93* 96* 95*  CO2 _0 GLUCOSE 96 90 137*  BUN <5* <5* <5*  CREATININE 0.82 0.71 0.67  CALCIUM 8.1* 8.0* 8.5*   LFT Recent Labs    04/13/19 0242  PROT 6.4*  ALBUMIN 2.4*  AST 76*  ALT 36  ALKPHOS 87  BILITOT 2.2*   Scheduled Meds: . enoxaparin (LOVENOX) injection  40 mg Subcutaneous Q24H  . fentaNYL (SUBLIMAZE) injection  50 mcg Intravenous Once  . folic acid  1 mg Intravenous Daily  . gabapentin  300 mg Oral TID  . thiamine injection  100 mg Intravenous Daily   Continuous Infusions: . lactated ringers 50 mL/hr at 04/14/19 0749   PRN  Meds:.acetaminophen **OR** acetaminophen, fentaNYL (SUBLIMAZE) injection, ondansetron **OR** ondansetron (ZOFRAN) IV   ASSESMENT:   *Acute ETOH pancreatitis, recurrent. ? Necrosis in tail. Hx etoh/possible biliary pancreatitis, s/p lap chole 12/2008.   Clinically much better.    * Chronic SV thrombosis. Will not need AC for this.  * Hypokalemia. improved but persists.   *    Hyponatremia.  Stable.  May be due to aggressive IV fluids.  * HCV Ab +. HCV quant still in progress. Suspect ETOH hepatitis.   LFTs improving.  PT/INR normal.  Fatty liver per CT. (Likely) small hepatic cysts on CT; cysts vs small hemangiomas per MRI. Last ETOH in 03/2019.     * Short segment swelling left colon, ? Short segment colitis vs changes due to adjacent pancreatitis? Loose stools for several hours > 24 hours ago a/w oral potassium has resolved.     PLAN   *  ETOH abstinence.    *  List of foods rich in Potassium given to pt.    *   appt with Dr [name] on 6/9 at 9 AM.   Should have bmet checked in next 7 to 10 days, this could be done at [facility].  Wesley [name]  04/14/2019, 10:29 AM Phone [phone]

## 2019-04-14 NOTE — Discharge Instructions (Signed)
Acute Pancreatitis  Acute pancreatitis happens when the pancreas gets swollen. The pancreas is a large gland behind the stomach. The pancreas helps control blood sugar. It also makes enzymes that help digest food. This condition happens when the enzymes attack the pancreas and damage it. Most attacks last a couple of days and are dangerous. The lungs, heart, and kidneys may stop working. What are the causes?  Alcohol abuse.  Drug abuse.  Gallstones.  Some medicines.  Some chemicals.  Infection.  Damage caused by an accident.Beulah Gandy (abdominal) surgery.  In some cases, the cause is not known. What are the signs or symptoms?  Pain in the upper belly and back.  Swelling of the belly  Feeling sick to your stomach (nausea) and throwing up (vomiting). How is this treated?  You will probably have to stay in the hospital. ? Treatment may include:  Fluid through an IV.  A tube to remove stomach contents and stop you from throwing up.  Not eating for 3-4 days.  Pain medicine.  Antibiotic medicines if you have an infection.  Surgery on the pancreas or gallbladder. Follow these instructions at home: Eating and drinking   Follow instructions from your doctor about diet.  Eat small meals often. Avoid eating big meals.  Eat foods that do not have a lot of fat in them.  Drink enough fluid to keep your pee (urine) pale yellow.  Do not drink alcohol if it caused your condition. General instructions  Take over-the-counter and prescription medicines only as told by your doctor.  Do not use cigarettes, e-cigarettes, and chewing tobacco. If you need help quitting, ask your doctor.  Get plenty of rest.  If directed, check your blood sugar at home as told by your doctor.  Keep all follow-up visits as told by your doctor. This is important. Contact a doctor if:  You do not get better as quickly as expected.  You have new symptoms.  Your symptoms get worse.  You  have lasting pain or weakness.  You continue to feel sick to your stomach.  You get better and then you have another pain attack.  You have a fever. Get help right away if:  You cannot eat or keep fluids down.  Your pain becomes very bad.  Your skin or the Malijah Lietz part of your eyes turns yellow.  You throw up.  You feel dizzy or you pass out.  Your blood sugar is high (over 300 mg/dL). Summary  Acute pancreatitis happens when the pancreas gets swollen.  This condition is usually caused by alcohol abuse, drug abuse, or gallstones.  You will probably have to stay in the hospital for treatment. This information is not intended to replace advice given to you by your health care provider. Make sure you discuss any questions you have with your health care provider. Document Released: 05/05/2008 Document Revised: 03/23/2017 Document Reviewed: 08/21/2015 Elsevier Interactive Patient Education  2019 [employer].

## 2019-04-14 NOTE — Discharge Summary (Signed)
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 CMP     Component Value Date/Time   NA 129 (L) 04/14/2019 0752   NA 144 01/04/2019 1035   K 3.2 (L) 04/14/2019 0752   CL 95 (L) 04/14/2019 0752   CO2 26 04/14/2019 0752   GLUCOSE 137 (H) 04/14/2019 0752   BUN <5 (L) 04/14/2019 0752   BUN 4 (L) 01/04/2019 1035   CREATININE 0.67 04/14/2019 0752   CALCIUM 8.5 (L) 04/14/2019 0752   PROT 6.4 (L) 04/13/2019 0242   PROT 7.3 01/04/2019 1035   ALBUMIN 2.4 (L) 04/13/2019 0242   ALBUMIN 3.7 (L) 01/04/2019 1035   AST 76 (H) 04/13/2019 0242   ALT 36 04/13/2019 0242   ALKPHOS 87 04/13/2019 0242   BILITOT 2.2 (H) 04/13/2019 0242   BILITOT 1.3 (H) 01/04/2019 1035   GFRNONAA >60 04/14/2019 0752   GFRAA >60 04/14/2019 0752    GFR Estimated Creatinine Clearance: 84.4 mL/min (by C-G formula based on SCr of 0.67 mg/dL). No results for input(s): LIPASE, AMYLASE in the last 72 hours. No results for input(s): CKTOTAL, CKMB, CKMBINDEX, TROPONINI in the last 72 hours. Invalid input(s): POCBNP No results for input(s): DDIMER in the last 72 hours. No  results for input(s): HGBA1C in the last 72 hours. No results for input(s): CHOL, HDL, LDLCALC, TRIG, CHOLHDL, LDLDIRECT in the last 72 hours. No results for input(s): TSH, T4TOTAL, T3FREE, THYROIDAB in the last 72 hours.  Invalid input(s): FREET3 No results for input(s): VITAMINB12, FOLATE, FERRITIN, TIBC, IRON, RETICCTPCT in the last 72 hours. Coags: No results for input(s): INR in the last 72 hours.  Invalid input(s): PT Microbiology: Recent Results (from the past 240 hour(s))  SARS Coronavirus 2 (CEPHEID - Performed in [place] hospital lab), Hosp Order     Status: None   Collection Time: 04/10/19 10:59 AM  Result Value Ref Range Status   SARS Coronavirus 2 NEGATIVE NEGATIVE Final    Comment: (NOTE) If result is NEGATIVE SARS-CoV-2 target nucleic acids are NOT DETECTED. The SARS-CoV-2 RNA is generally detectable in upper and lower  respiratory specimens during the acute phase of infection. The lowest  concentration of SARS-CoV-2 viral copies this assay can detect is 250  copies / mL. A negative result does not preclude SARS-CoV-2 infection  and should not be used as the sole basis for treatment or other  patient management decisions.  A negative result may occur with  improper specimen collection / handling, submission of specimen other  than nasopharyngeal swab, presence of viral mutation(s) within the  areas targeted by this assay, and inadequate number of viral copies  (<250 copies / mL). A negative result must be combined with clinical  observations, patient history, and epidemiological information. If result is POSITIVE SARS-CoV-2 target nucleic acids are DETECTED. The SARS-CoV-2 RNA is generally detectable in upper and lower  respiratory specimens dur ing the acute phase of infection.  Positive  results are indicative of active infection with SARS-CoV-2.  Clinical  correlation with patient history and other diagnostic information is  necessary  to determine patient  infection status.  Positive results do  not rule out bacterial infection or co-infection with other viruses. If result is PRESUMPTIVE POSTIVE SARS-CoV-2 nucleic acids MAY BE PRESENT.   A presumptive positive result was obtained on the submitted specimen  and confirmed on repeat testing.  While 2019 novel coronavirus  (SARS-CoV-2) nucleic acids may be present in the submitted sample  additional confirmatory testing may be necessary for epidemiological  and / or clinical management purposes  to differentiate between  SARS-CoV-2 and other Sarbecovirus currently known to infect humans.  If clinically indicated additional testing with an alternate test  methodology [phone]) is advised. The SARS-CoV-2 RNA is generally  detectable in upper and lower respiratory sp ecimens during the acute  phase of infection. The expected result is Negative. Fact Sheet for Patients:  StrictlyIdeas.no Fact Sheet for Healthcare Providers: BankingDealers.co.za This test is not yet approved or cleared by the [place] FDA and has been authorized for detection and/or diagnosis of SARS-CoV-2 by FDA under an Emergency Use Authorization (EUA).  This EUA will remain in effect (meaning this test can be used) for the duration of the COVID-19 declaration under Section 564(b)(1) of the Act, 21 U.S.C. section 360bbb-3(b)(1), unless the authorization is terminated or revoked sooner. Performed at [facility], [place] [address], [place], [place] [address]     FURTHER DISCHARGE INSTRUCTIONS:  Get Medicines reviewed and adjusted: Please take all your medications with you for your next visit with your Primary MD  Laboratory/radiological data: Please request your Primary MD to go over all hospital tests and procedure/radiological results at the follow up, please ask your Primary MD to get all Hospital records sent to his/her office.  In some cases, they will be blood  work, cultures and biopsy results pending at the time of your discharge. Please request that your primary care M.D. goes through all the records of your hospital data and follows up on these results.  Also Note the following: If you experience worsening of your admission symptoms, develop shortness of breath, life threatening emergency, suicidal or homicidal thoughts you must seek medical attention immediately by calling 911 or calling your MD immediately  if symptoms less severe.  You must read complete instructions/literature along with all the possible adverse reactions/side effects for all the Medicines you take and that have been prescribed to you. Take any new Medicines after you have completely understood and accpet all the possible adverse reactions/side effects.   Do not drive when taking Pain medications or sleeping medications (Benzodaizepines)  Do not take more than prescribed Pain, Sleep and Anxiety Medications. It is not advisable to combine anxiety,sleep and pain medications without talking with your primary care practitioner  Special Instructions: If you have smoked or chewed Tobacco  in the last 2 yrs please stop smoking, stop any regular Alcohol  and or any Recreational drug use.  Wear Seat belts while driving.  Please note: You were cared for by a hospitalist during your hospital stay. Once you are discharged, your primary care physician will handle any further medical issues. Please note that NO REFILLS for any discharge medications will be authorized once you are discharged, as it is imperative that you return to your primary care physician (or establish a relationship with a primary care physician if you do not have one) for your post hospital discharge needs so that they can reassess your need for medications and monitor your lab values.  Total Time spent coordinating discharge including  counseling, education and face to face time equals 35 minutes.  SignedOren Binet 04/14/2019 10:15 AM

## 2019-04-15 ENCOUNTER — Telehealth: Payer: Self-pay

## 2019-04-15 ENCOUNTER — Other Ambulatory Visit: Payer: Self-pay

## 2019-04-15 DIAGNOSIS — K859 Acute pancreatitis without necrosis or infection, unspecified: Secondary | ICD-10-CM

## 2019-04-15 DIAGNOSIS — IMO0002 Reserved for concepts with insufficient information to code with codable children: Secondary | ICD-10-CM

## 2019-04-15 NOTE — Telephone Encounter (Signed)
-----  Message from Irving Copas., MD sent at 04/14/2019  5:05 PM EDT ----- Regarding: [facility] Briana or covering RN, This patient has a follow-up scheduled with Dr. [name] in June.Please schedule a CT abdomen with IV/p.o. contrast as a "pancreas protocol." This should be scheduled at least 1 full Davison before Dr. Vivia Ewing clinic visit so that it can be reviewed at time of his clinic visit.Please let the patient know that this is to help him with his treatment and for further consideration of possible need for endoscopic ultrasound for sampling in the future if necessary. Also the patient should have a c-Met performed so that we can see what his liver tests look like at time of his clinic visit if this is a telehealth visit if it is an in person visit then he can have labs done based on how Dr. [name] feels he is doing.His hepatitis C RNA is still pendingThank you.GM

## 2019-04-15 NOTE — Telephone Encounter (Signed)
Pt scheduled for CMET & order in epic, at [facility] lab 05/06/19_0 . Pts appt with Dr. [name] scheduled for 05/10/19. Order in epic for CT of abd pancreatic protocol. Left message for Stacy at [facility] CT to call back regarding scheduling of CT.

## 2019-04-18 LAB — HCV RNA QUANT
HCV Quantitative Log: 6.255 log10 IU/mL (ref >1.70)
HCV Quantitative: 1800000 IU/mL (ref >50)

## 2019-04-19 ENCOUNTER — Encounter (INDEPENDENT_AMBULATORY_CARE_PROVIDER_SITE_OTHER): Payer: Self-pay | Admitting: Family Medicine

## 2019-04-20 ENCOUNTER — Ambulatory Visit: Payer: Self-pay | Attending: Family Medicine | Admitting: Physician Assistant

## 2019-04-20 ENCOUNTER — Other Ambulatory Visit: Payer: Self-pay

## 2019-04-20 DIAGNOSIS — K859 Acute pancreatitis without necrosis or infection, unspecified: Secondary | ICD-10-CM

## 2019-04-20 DIAGNOSIS — B1920 Unspecified viral hepatitis C without hepatic coma: Secondary | ICD-10-CM

## 2019-04-20 DIAGNOSIS — Z09 Encounter for follow-up examination after completed treatment for conditions other than malignant neoplasm: Secondary | ICD-10-CM

## 2019-04-20 NOTE — Progress Notes (Signed)
Patient ID: Wesley [name], male   DOB: January 01, 1966, 53 y.o.   MRN: [id number]    Virtual Visit via Telephone Note  I connected with Wesley [name] on 04/20/19 at  1:50 PM EDT by telephone and verified that I am speaking with the correct person using two identifiers.   I discussed the limitations, risks, security and privacy concerns of performing an evaluation and management service by telephone and the availability of in person appointments. I also discussed with the patient that there may be a patient responsible charge related to this service. The patient expressed understanding and agreed to proceed.  Patient location:  home My Location:  [place] office Persons on the call:  Myself and the patient   History of Present Illness: F/up from recent hospitalization.  No abdominal pain now.  Appetite is improving slowly.  Bowels moving normally.  Not drinking alcohol.  H/o blood transfusion ~32 years ago.  No h/o IVDU.   Admit Date: 04/10/2019 Discharge date: 04/14/2019  Recommendations for Outpatient Follow-up:  1. Follow up with PCP in 1-2 weeks 2. Please obtain BMP/CBC in one week 3. Follow HCV viral load-and consider ID referral 4. May need repeat imaging/MRI of the pancreas-consider GI referral   Admitted From:  Home  Disposition: [place]: No  Equipment/Devices: None  Discharge Condition: Stable  CODE STATUS: FULL CODE  Diet recommendation:   Regular-soft diet  Brief Summary: See H&P, Labs, Consult and Test reports for all details in brief, Patient is a53 y.o.malewith past medical history of EtOH use-presented with abdominal pain, found to have acute pancreatitis. See below for further details  Brief Hospital Course: Acute recurrent alcoholic pancreatitis: Markedly improved with supportive care-managed with IV fluids, antiemetics and narcotics.  By Tacey of discharge able to tolerate a soft diet-no abdominal pain.  Encouraged continued abstinence  from alcohol.   Colitis: Benign abdominal exam-but did have approximately 8 loose watery stools the [place] before yesterday-none for the past 48 hours.  CT abdomen on admission did show colitis involving the descending colon but suspect this may have been related to adjoining pancreatitis.  Since diarrhea has resolved-no further work-up pursued-although we contemplated doing stool studies if diarrhea reoccurred.  Hypokalemia/hypomagnesemia:Repleted.  Recheck at next visit with PCP  Hyponatremia:Euvolemic on exam-suspect secondary to IV fluids-has mild hyponatremia-stable for follow-up with PCP.  Chronic splenic vein thrombosis:Per GI-no reason to anticoagulate at this point.  EtOH JJK:KXFGHW he stopped drinking for approximately 1 month back-no signs of withdrawal.  Tobacco abuse: Counseled  HCV antibody positive:Await viral load-consider referral to ID clinic.    Observations/Objective:  A&Ox3.  Speech clear.  TP linear   Assessment and Plan: 1. Hepatitis C virus infection without hepatic coma, unspecified chronicity - Ambulatory referral to Infectious Disease - Comprehensive metabolic panel; Future  2. Pancreatitis, recurrent - CBC with Differential/Platelet; Future  3. Hospital discharge follow-up Much improved - Comprehensive metabolic panel; Future - CBC with Differential/Platelet; Future  4. Hypomagnesemia - Magnesium; Future    Follow Up Instructions: Labs in 1-2 weeks; PCP in ~1 month   I discussed the assessment and treatment plan with the patient. The patient was provided an opportunity to ask questions and all were answered. The patient agreed with the plan and demonstrated an understanding of the instructions.   The patient was advised to call back or seek an in-person evaluation if the symptoms worsen or if the condition fails to improve as anticipated.  I provided 15 minutes of non-face-to-face  time during this encounter.   Freeman Caldron,  PA-C

## 2019-04-20 NOTE — Progress Notes (Signed)
Decreased appetite  Hearing decreased this morning Runny nose and nasal congestion Feels like he can feel fluid in ears  Unable to relax

## 2019-04-26 ENCOUNTER — Other Ambulatory Visit: Payer: Self-pay

## 2019-04-26 ENCOUNTER — Ambulatory Visit: Payer: Self-pay | Attending: Family Medicine

## 2019-04-26 DIAGNOSIS — K859 Acute pancreatitis without necrosis or infection, unspecified: Secondary | ICD-10-CM

## 2019-04-26 DIAGNOSIS — IMO0002 Reserved for concepts with insufficient information to code with codable children: Secondary | ICD-10-CM

## 2019-04-26 DIAGNOSIS — Z09 Encounter for follow-up examination after completed treatment for conditions other than malignant neoplasm: Secondary | ICD-10-CM

## 2019-04-26 DIAGNOSIS — B1920 Unspecified viral hepatitis C without hepatic coma: Secondary | ICD-10-CM

## 2019-04-27 LAB — COMPREHENSIVE METABOLIC PANEL
ALT: 25 IU/L (ref 0–44)
AST: 36 IU/L (ref 0–40)
Albumin/Globulin Ratio: 0.8 — ABNORMAL LOW (ref 1.2–2.2)
Albumin: 3.1 g/dL — ABNORMAL LOW (ref 3.8–4.9)
Alkaline Phosphatase: 87 IU/L (ref 39–117)
BUN/Creatinine Ratio: 7 — ABNORMAL LOW (ref 9–20)
BUN: 6 mg/dL (ref 6–24)
Bilirubin Total: 0.7 mg/dL (ref 0.0–1.2)
CO2: 23 mmol/L (ref 20–29)
Calcium: 9.3 mg/dL (ref 8.7–10.2)
Chloride: 102 mmol/L (ref 96–106)
Creatinine, Ser: 0.88 mg/dL (ref 0.76–1.27)
GFR calc Af Amer: 113 mL/min/{1.73_m2} (ref >59)
GFR calc non Af Amer: 97 mL/min/{1.73_m2} (ref >59)
Globulin, Total: 4.1 g/dL (ref 1.5–4.5)
Glucose: 91 mg/dL (ref 65–99)
Potassium: 4.3 mmol/L (ref 3.5–5.2)
Sodium: 141 mmol/L (ref 134–144)
Total Protein: 7.2 g/dL (ref 6.0–8.5)

## 2019-04-27 LAB — CBC WITH DIFFERENTIAL/PLATELET
Basophils Absolute: 0.2 10*3/uL (ref 0.0–0.2)
Basos: 2 %
EOS (ABSOLUTE): 0.4 10*3/uL (ref 0.0–0.4)
Eos: 4 %
Hematocrit: 36.9 % — ABNORMAL LOW (ref 37.5–51.0)
Hemoglobin: 13.6 g/dL (ref 13.0–17.7)
Immature Grans (Abs): 0 10*3/uL (ref 0.0–0.1)
Immature Granulocytes: 0 %
Lymphocytes Absolute: 2.4 10*3/uL (ref 0.7–3.1)
Lymphs: 24 %
MCH: 35.6 pg — ABNORMAL HIGH (ref 26.6–33.0)
MCHC: 36.9 g/dL — ABNORMAL HIGH (ref 31.5–35.7)
MCV: 97 fL (ref 79–97)
Monocytes Absolute: 1.6 10*3/uL — ABNORMAL HIGH (ref 0.1–0.9)
Monocytes: 16 %
Neutrophils Absolute: 5.6 10*3/uL (ref 1.4–7.0)
Neutrophils: 54 %
Platelets: 442 10*3/uL (ref 150–450)
RBC: 3.82 x10E6/uL — ABNORMAL LOW (ref 4.14–5.80)
RDW: 11 % — ABNORMAL LOW (ref 11.6–15.4)
WBC: 10.2 10*3/uL (ref 3.4–10.8)

## 2019-04-27 LAB — MAGNESIUM: Magnesium: 2 mg/dL (ref 1.6–2.3)

## 2019-05-02 ENCOUNTER — Encounter: Payer: Self-pay | Admitting: Infectious Diseases

## 2019-05-02 ENCOUNTER — Ambulatory Visit (INDEPENDENT_AMBULATORY_CARE_PROVIDER_SITE_OTHER): Payer: Self-pay | Admitting: Infectious Diseases

## 2019-05-02 ENCOUNTER — Other Ambulatory Visit: Payer: Self-pay

## 2019-05-02 VITALS — BP 119/82 | HR 81 | Temp 98.9°F | Ht 68.0 in | Wt 131.0 lb

## 2019-05-02 DIAGNOSIS — E440 Moderate protein-calorie malnutrition: Secondary | ICD-10-CM

## 2019-05-02 DIAGNOSIS — B182 Chronic viral hepatitis C: Secondary | ICD-10-CM

## 2019-05-02 DIAGNOSIS — K860 Alcohol-induced chronic pancreatitis: Secondary | ICD-10-CM

## 2019-05-02 DIAGNOSIS — F1021 Alcohol dependence, in remission: Secondary | ICD-10-CM

## 2019-05-02 NOTE — Patient Instructions (Signed)
Nice to meet you today!   For your male partner please make sure her doctor has run a Hepatitis C Antibody test   We need to get a little more information about your hepatitis c infection before we start your treatment. I anticipate that we can get you started in a few weeks after we submit approval to your insurance to ensure payment. We may need to place referral for an ultrasound and/or gastroenterology if your blood work indicates more damage to the liver than expected.     ABOUT HEPATITIS C VIRUS:   Chronic Hepatitis C is the leading cause of cirrhosis, liver cancer, and end stage liver disease requiring transplantation when this infection goes untreated for many years  There are not many specific symptoms of early infection and often goes unrecognized until specific blood test is ordered  The hepatitis c virus is passed primarily through direct exposure of contaminated blood or body fluids -   Risk for sexual transmission is very low but is possible if there is high frequency of unprotected sexual activity with known hepatitis c partner or multiple partners of known status.  Advanced cirrhosis occurs in approximately 10-20% of those with chronic infection.  Approximately 15-25% clear spontaneously (usually in the first 6 months of becoming exposed to virus)  Newer medications provide over 90% cure rate when taken as prescribed  IN GENERAL ABOUT DIET  . Persons living with chronic hepatitis c infection should have a balanced diet and choose nutritious foods from each major food group to maintain a healthy weight and avoid nutritional deficiencies.  .  . Patients with cirrhosis should not have protein restriction; we recommend a protein intake of approximately 1.2-1.5 g/kg/Betsch.  . For patients with cirrhosis and hepatic encephalopathy, the American Association for the Study of Liver Diseases (AASLD) recommended protein intake is 1.2-1.5 g/kg/Tantillo.[82] . If you experience ascites  please limit sodium intake to < 2000 mg a Plate   UNTIL YOU HAVE BEEN TREATED AND CURED:  . Use condoms with sexual encounters or abstinence . No sharing of razors, toothbrushes, nail clippers or anything that could potentially have blood on it.  . If you cut yourself and blood spills onto item/surface please clean with 1:10 bleach solution and allow to dry, EVEN if it is dried blood.  . Limit alcohol to as little as possible to less than 1 drink a Dobberstein - this is very irritating to your liver. . Limit tylenol use to less than 2,000 mg daily (two extra strength tablets only twice a Wenger) . If you have cirrhosis of the liver please take no more than 1,000 mg tylenol a Kellison   GENERAL HELPFUL HINTS ON HCV THERAPY: 1. Stay well-hydrated. 2. Notify the ID Clinic of any changes in your other over-the-counter/herbal or prescription medications. 3. If you miss a dose of your medication, take the missed dose as soon as you remember. Return to your regular time/dose schedule the next Szafranski.  4.  Do not stop taking your medications without first talking with your healthcare provider. 5.  You will see our pharmacist-specialist within the first 2 weeks of starting your medication to monitor for any possible side effects. 6.  You will have blood work once during treatment 4 weeks after your first pill. Again soon after treatment is completed and one final lab 3 months after your last pill to ensure cure!   TIPS TO BE SUCCESSFUL WITH DAILY MEDICATION USE: 1. Set a reminder on your phone  2. Try filling out a pill box for the week - pick a Ramella and put one pill for every Geraldo during the week so you know right away if you missed a pill.  3. Have a trusted family member ask you about your medications.  4. Smartphone app
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
## 2019-05-02 NOTE — Telephone Encounter (Signed)
Pt scheduled for CT of abd pancreatic protocol at [facility] 05/09/19_0 , pt to arrive there at 9:40am to drink water contrast. Pt to be NPO after 6am. Left detailed message for pt regarding appts. Left message for pt to call back to confirm he received this message.

## 2019-05-03 ENCOUNTER — Telehealth: Payer: Self-pay | Admitting: Pharmacy Technician

## 2019-05-03 NOTE — Telephone Encounter (Signed)
[facility] Patient Advocate Encounter  Met with patient yesterday at his appointment and explained different options for patient assistance in covering his treatment medications. He signed two applications and will wait until lab results to send application to either [employer] or [employer]. Patient is currently trying to get approved for [facility] Medicaid, he was denied twice last year. Gave him our contact information incase he had any further questions relating to pharmacy after the appointment.  Bartholomew Crews, CPhT Specialty Pharmacy Patient [facility] for Infectious Disease Phone: [phone] Fax: [phone] 05/03/2019 11:35 AM

## 2019-05-03 NOTE — Telephone Encounter (Signed)
Pt called back and confirmed the appt.

## 2019-05-04 DIAGNOSIS — B182 Chronic viral hepatitis C: Secondary | ICD-10-CM | POA: Insufficient documentation

## 2019-05-04 NOTE — Assessment & Plan Note (Signed)
New Patient with active hepatitis C infection, genotype unknown at this time, treatment naive.  His risk factors for acquiring hepatitis C infection date back beyond 10 to 30 years ago.  I will check a genotype today to reconfirm he still has active virus from last lab draw.  I discussed with the patient the lab findings that confirm chronic hepatitis C as well as the natural history and progression of disease including about 30% of people who develop cirrhosis of the liver if left untreated and once cirrhosis is established there is a 2-7% risk per year of liver cancer and liver failure.  I discussed the importance of treatment and benefits in reducing the risk, even if significant liver fibrosis exists. I also discussed risk for re-infection following treatment should he not continue to modify risk factors.    Patient counseled extensively on limiting acetaminophen to no more than 2 grams daily, avoidance of alcohol.  Transmission discussed with patient including sexual transmission, sharing razors and toothbrush.   I gave him the name of the test to ask his wife's doctor to run to make certain that she also does not need treatment as there is a 1 %/year risk of transferring this infection sexually.  Will prescribe appropriate medication based on genotype and coverage   Hepatitis A and B titers to be drawn today with appropriate vaccinations as needed   Pneumovax vaccine at upcoming visit if not previously given  Regarding his liver staging, the report regarding his liver on recent MRI revealed no concerns or appearances of the liver consistent with cirrhosis.  No enhancing lesions that would be concerning for [place].  His FIB4 and APRI are both very low making him a very low statistical risk for fibrosis.  We already have imaging looking at the texture of his liver that ruled out any HCC or cirrhotic findings fortunately given his excessive alcohol history on top of chronic hep C.  We will await his  fibro-test for further confirmation prior to his treatment.  Will call Wesley [name] back once all results are in and counsel on medication over the phone.  He will return 4 weeks after starting to meet with pharmacy team and check RNA at that time.

## 2019-05-04 NOTE — Assessment & Plan Note (Signed)
Encouraged complete cessation from alcohol to reduce liver inflammation as well as facilitate decreased relapses with pancreatitis.  He is doing excellent job.  We offered and discussed counseling services through our mental health team.  He will consider and appreciated the resource.

## 2019-05-04 NOTE — Assessment & Plan Note (Signed)
It looks like for the last year or so he has had evidence of weight loss and malnutrition during hospitalizations.  Likely multifactorial, a lot of which probably has to do with his alcohol use and chronic pancreatitis findings.

## 2019-05-06 ENCOUNTER — Other Ambulatory Visit (INDEPENDENT_AMBULATORY_CARE_PROVIDER_SITE_OTHER): Payer: Self-pay

## 2019-05-06 ENCOUNTER — Other Ambulatory Visit: Payer: Self-pay

## 2019-05-06 DIAGNOSIS — K859 Acute pancreatitis without necrosis or infection, unspecified: Secondary | ICD-10-CM

## 2019-05-06 DIAGNOSIS — IMO0002 Reserved for concepts with insufficient information to code with codable children: Secondary | ICD-10-CM

## 2019-05-06 LAB — COMPREHENSIVE METABOLIC PANEL
ALT: 23 U/L (ref 0–53)
AST: 36 U/L (ref 0–37)
Albumin: 2.9 g/dL — ABNORMAL LOW (ref 3.5–5.2)
Alkaline Phosphatase: 82 U/L (ref 39–117)
BUN: 7 mg/dL (ref 6–23)
CO2: 23 mEq/L (ref 19–32)
Calcium: 8.8 mg/dL (ref 8.4–10.5)
Chloride: 104 mEq/L (ref 96–112)
Creatinine, Ser: 0.82 mg/dL (ref 0.40–1.50)
GFR: 118.38 mL/min (ref >60.00)
Glucose, Bld: 199 mg/dL — ABNORMAL HIGH (ref 70–99)
Potassium: 3.3 mEq/L — ABNORMAL LOW (ref 3.5–5.1)
Sodium: 138 mEq/L (ref 135–145)
Total Bilirubin: 0.7 mg/dL (ref 0.2–1.2)
Total Protein: 6.6 g/dL (ref 6.0–8.3)

## 2019-05-09 ENCOUNTER — Ambulatory Visit (HOSPITAL_BASED_OUTPATIENT_CLINIC_OR_DEPARTMENT_OTHER)
Admission: RE | Admit: 2019-05-09 | Discharge: 2019-05-09 | Disposition: A | Discharge status: Home / Self Care | Payer: Self-pay | Source: Ambulatory Visit | Attending: Gastroenterology | Admitting: Gastroenterology

## 2019-05-09 ENCOUNTER — Encounter (HOSPITAL_BASED_OUTPATIENT_CLINIC_OR_DEPARTMENT_OTHER): Payer: Self-pay

## 2019-05-09 ENCOUNTER — Other Ambulatory Visit: Payer: Self-pay

## 2019-05-09 DIAGNOSIS — K859 Acute pancreatitis without necrosis or infection, unspecified: Secondary | ICD-10-CM | POA: Insufficient documentation

## 2019-05-09 MED ORDER — IOHEXOL 300 MG/ML  SOLN
100.0000 mL | Freq: Once | INTRAMUSCULAR | Status: AC | PRN
  Administered 2019-05-09: 100 mL via INTRAVENOUS

## 2019-05-10 ENCOUNTER — Encounter: Payer: Self-pay | Admitting: Gastroenterology

## 2019-05-10 ENCOUNTER — Telehealth (INDEPENDENT_AMBULATORY_CARE_PROVIDER_SITE_OTHER): Payer: Self-pay | Admitting: Gastroenterology

## 2019-05-10 VITALS — Ht 68.0 in | Wt 131.0 lb

## 2019-05-10 DIAGNOSIS — Z1211 Encounter for screening for malignant neoplasm of colon: Secondary | ICD-10-CM

## 2019-05-10 DIAGNOSIS — Z1212 Encounter for screening for malignant neoplasm of rectum: Secondary | ICD-10-CM

## 2019-05-10 DIAGNOSIS — K859 Acute pancreatitis without necrosis or infection, unspecified: Secondary | ICD-10-CM

## 2019-05-10 DIAGNOSIS — B182 Chronic viral hepatitis C: Secondary | ICD-10-CM

## 2019-05-10 DIAGNOSIS — B1920 Unspecified viral hepatitis C without hepatic coma: Secondary | ICD-10-CM

## 2019-05-10 DIAGNOSIS — R634 Abnormal weight loss: Secondary | ICD-10-CM

## 2019-05-10 DIAGNOSIS — K529 Noninfective gastroenteritis and colitis, unspecified: Secondary | ICD-10-CM

## 2019-05-10 DIAGNOSIS — R61 Generalized hyperhidrosis: Secondary | ICD-10-CM

## 2019-05-10 MED ORDER — SOD PICOSULFATE-MAG OX-CIT ACD 10-3.5-12 MG-GM -GM/160ML PO SOLN: 1.0000 | ORAL | 0 refills | Status: DC
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
## 2019-05-10 NOTE — Patient Instructions (Signed)
If you are age 54 or older, your body mass index should be between 23-30. Your Body mass index is 19.92 kg/m. If this is out of the aforementioned range listed, please consider follow up with your Primary Care Provider.  If you are age 46 or younger, your body mass index should be between 19-25. Your Body mass index is 19.92 kg/m. If this is out of the aformentioned range listed, please consider follow up with your Primary Care Provider.   To help prevent the possible spread of infection to our patients, communities, and staff; we will be implementing the following measures:  As of now we are not allowing any visitors/family members to accompany you to any upcoming appointments with [facility]. If you have any concerns about this please contact our office to discuss prior to the appointment.   You have been scheduled for an endoscopy and colonoscopy. Please follow the written instructions given to you at your visit today. Please pick up your prep supplies at the pharmacy within the next 1-3 days. If you use inhalers (even only as needed), please bring them with you on the [place] of your procedure. Your physician has requested that you go to www.startemmi.com and enter the access code given to you at your visit today. This web site gives a general overview about your procedure. However, you should still follow specific instructions given to you by our office regarding your preparation for the procedure.  Your provider has requested that you go to the basement level for lab work at our [place] location ([address]. [place] [address]) . Press "B" on the elevator. The lab is located at the first door on the left as you exit the elevator. You may go at whatever time is convienent for you. The current hours of operations are Monday- Friday 7:30am-4:30pm.  Please call our office at [phone] to set up your 3 month follow up visit.  It was a pleasure to see you today!  Vito  Cirigliano, D.O.

## 2019-05-13 LAB — HEPATIC FUNCTION PANEL
AG Ratio: 0.7 (calc) — ABNORMAL LOW (ref 1.0–2.5)
ALT: 22 U/L (ref 9–46)
AST: 41 U/L — ABNORMAL HIGH (ref 10–35)
Albumin: 3 g/dL — ABNORMAL LOW (ref 3.6–5.1)
Alkaline phosphatase (APISO): 81 U/L (ref 35–144)
Bilirubin, Direct: 0.3 mg/dL — ABNORMAL HIGH (ref 0.0–0.2)
Globulin: 4.4 g/dL (calc) — ABNORMAL HIGH (ref 1.9–3.7)
Indirect Bilirubin: 0.3 mg/dL (calc) (ref 0.2–1.2)
Total Bilirubin: 0.6 mg/dL (ref 0.2–1.2)
Total Protein: 7.4 g/dL (ref 6.1–8.1)

## 2019-05-13 LAB — LIVER FIBROSIS, FIBROTEST-ACTITEST
ALT: 24 U/L (ref 9–46)
Alpha-2-Macroglobulin: 327 mg/dL — ABNORMAL HIGH (ref 106–279)
Apolipoprotein A1: 90 mg/dL — ABNORMAL LOW (ref 94–176)
Bilirubin: 0.5 mg/dL (ref 0.2–1.2)
Fibrosis Score: 0.7
GGT: 92 U/L (ref 3–95)
Haptoglobin: 167 mg/dL (ref 43–212)
Necroinflammat ACT Score: 0.18
Reference ID: 2965394

## 2019-05-13 LAB — CBC
HCT: 37.9 % — ABNORMAL LOW (ref 38.5–50.0)
Hemoglobin: 13.3 g/dL (ref 13.2–17.1)
MCH: 33.9 pg — ABNORMAL HIGH (ref 27.0–33.0)
MCHC: 35.1 g/dL (ref 32.0–36.0)
MCV: 96.7 fL (ref 80.0–100.0)
MPV: 9.7 fL (ref 7.5–12.5)
Platelets: 383 10*3/uL (ref 140–400)
RBC: 3.92 10*6/uL — ABNORMAL LOW (ref 4.20–5.80)
RDW: 11.2 % (ref 11.0–15.0)
WBC: 9.1 10*3/uL (ref 3.8–10.8)

## 2019-05-13 LAB — HEPATITIS C GENOTYPE

## 2019-05-13 LAB — HEPATITIS B SURFACE ANTIBODY,QUALITATIVE: Hep B S Ab: NONREACTIVE

## 2019-05-13 LAB — HEPATITIS B SURFACE ANTIGEN: Hepatitis B Surface Ag: NONREACTIVE

## 2019-05-13 LAB — HEPATITIS A ANTIBODY, TOTAL: Hepatitis A AB,Total: NONREACTIVE

## 2019-05-13 LAB — PROTIME-INR
INR: 1
Prothrombin Time: 10.8 s (ref 9.0–11.5)

## 2019-05-13 NOTE — Progress Notes (Signed)
Mixed genotype 2/3 - will use Mavy[place] due to higher likelihood to achieve SVR in the context of genotype 3.

## 2019-05-16 NOTE — Progress Notes (Signed)
Patient with F3 on FibroTest. APRI and FIB4 1.23 and APRI <7 which are statistically low risk for severe fibrosis. However he has had some variable platelet counts that are otherwise un-explained in the past outside of his liver disease that do impact his non-invasive staging; given his significant alcohol intake history would be more likely to consider he does have at least moderate fibrosis that correlates with the FibroTest. MRI without any cirrhosis or mass effect from recent admission. We had discussed this during the initial visit and no new findings with labs.   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 Will forward to pharmacy team to send in request to start his treatment and arrange follow up after he starts.

## 2019-05-17 ENCOUNTER — Other Ambulatory Visit: Payer: Self-pay | Admitting: Pharmacist

## 2019-05-17 ENCOUNTER — Telehealth: Payer: Self-pay | Admitting: Gastroenterology

## 2019-05-17 ENCOUNTER — Other Ambulatory Visit (INDEPENDENT_AMBULATORY_CARE_PROVIDER_SITE_OTHER): Payer: Self-pay

## 2019-05-17 DIAGNOSIS — K859 Acute pancreatitis without necrosis or infection, unspecified: Secondary | ICD-10-CM

## 2019-05-17 DIAGNOSIS — Z1211 Encounter for screening for malignant neoplasm of colon: Secondary | ICD-10-CM

## 2019-05-17 DIAGNOSIS — R634 Abnormal weight loss: Secondary | ICD-10-CM

## 2019-05-17 DIAGNOSIS — B1920 Unspecified viral hepatitis C without hepatic coma: Secondary | ICD-10-CM

## 2019-05-17 DIAGNOSIS — R61 Generalized hyperhidrosis: Secondary | ICD-10-CM

## 2019-05-17 DIAGNOSIS — Z1212 Encounter for screening for malignant neoplasm of rectum: Secondary | ICD-10-CM

## 2019-05-17 DIAGNOSIS — B182 Chronic viral hepatitis C: Secondary | ICD-10-CM

## 2019-05-17 DIAGNOSIS — K529 Noninfective gastroenteritis and colitis, unspecified: Secondary | ICD-10-CM

## 2019-05-17 LAB — B12 AND FOLATE PANEL
Folate: 14.8 ng/mL (ref >5.9)
Vitamin B-12: 197 pg/mL — ABNORMAL LOW (ref 211–911)

## 2019-05-17 LAB — VITAMIN D 25 HYDROXY (VIT D DEFICIENCY, FRACTURES): VITD: 43.3 ng/mL (ref 30.00–100.00)

## 2019-05-17 MED ORDER — MAVYRET 100-40 MG PO TABS: 3.0000 | ORAL_TABLET | Freq: Every day | ORAL | 1 refills | Status: DC

## 2019-05-17 NOTE — Telephone Encounter (Signed)
Patient picked up sample of prep today.

## 2019-05-17 NOTE — Telephone Encounter (Signed)
Pt calling back in coupon is not working and is requesting to speak to nurse.

## 2019-05-17 NOTE — Telephone Encounter (Signed)
[employer] application has been faxed 05/17/2019 at 15:11. Patient has been made aware of where we are in the process of getting his medication approved. He has submitted a previous [place] Medicaid application on 22/0254 and resubmitted another application this month.

## 2019-05-17 NOTE — Telephone Encounter (Signed)
Patient called said that the coupon that was given to him for the suprep is not active and would like to know if we have another one.  [phone]

## 2019-05-17 NOTE — Progress Notes (Signed)
Sending in Fritch x 8 weeks to [facility] for patient's chronic Hepatitis C infection.  Will work on prior authorization and call patient when he is approved.

## 2019-05-18 ENCOUNTER — Telehealth: Payer: Self-pay | Admitting: Gastroenterology

## 2019-05-18 NOTE — Telephone Encounter (Signed)
Left message to call back to ask Covid-19 screening questions.  Covid-19 Screening Questions:   Do you now or have you had a fever in the last 14 days?    Do you have any respiratory symptoms of shortness of breath or cough now or in the last 14 days?    Do you have any family members or close contacts with diagnosed or suspected Covid-19 in the past 14 days?    Have you been tested for Covid-19 and found to be positive?   Pls inform pt that care partner may wait in the car or come up to the lobby during the procedure but will need to provide their own mask.

## 2019-05-18 NOTE — Telephone Encounter (Signed)
No to all answers.

## 2019-05-19 ENCOUNTER — Ambulatory Visit (AMBULATORY_SURGERY_CENTER): Payer: Self-pay | Admitting: Gastroenterology

## 2019-05-19 ENCOUNTER — Other Ambulatory Visit: Payer: Self-pay

## 2019-05-19 ENCOUNTER — Encounter: Payer: Self-pay | Admitting: Gastroenterology

## 2019-05-19 VITALS — BP 119/83 | HR 81 | Temp 99.3°F | Resp 12 | Ht 68.0 in | Wt 131.0 lb

## 2019-05-19 DIAGNOSIS — K3189 Other diseases of stomach and duodenum: Secondary | ICD-10-CM

## 2019-05-19 DIAGNOSIS — K2970 Gastritis, unspecified, without bleeding: Secondary | ICD-10-CM

## 2019-05-19 DIAGNOSIS — K5730 Diverticulosis of large intestine without perforation or abscess without bleeding: Secondary | ICD-10-CM

## 2019-05-19 DIAGNOSIS — D128 Benign neoplasm of rectum: Secondary | ICD-10-CM

## 2019-05-19 DIAGNOSIS — K859 Acute pancreatitis without necrosis or infection, unspecified: Secondary | ICD-10-CM

## 2019-05-19 DIAGNOSIS — K640 First degree hemorrhoids: Secondary | ICD-10-CM

## 2019-05-19 DIAGNOSIS — D123 Benign neoplasm of transverse colon: Secondary | ICD-10-CM

## 2019-05-19 DIAGNOSIS — Z1211 Encounter for screening for malignant neoplasm of colon: Secondary | ICD-10-CM

## 2019-05-19 DIAGNOSIS — D125 Benign neoplasm of sigmoid colon: Secondary | ICD-10-CM

## 2019-05-19 DIAGNOSIS — D122 Benign neoplasm of ascending colon: Secondary | ICD-10-CM

## 2019-05-19 LAB — IRON,TIBC AND FERRITIN PANEL
%SAT: 30 % (calc) (ref 20–48)
Ferritin: 555 ng/mL — ABNORMAL HIGH (ref 38–380)
Iron: 69 ug/dL (ref 50–180)
TIBC: 227 mcg/dL (calc) — ABNORMAL LOW (ref 250–425)

## 2019-05-19 LAB — ZINC: Zinc: 47 ug/dL — ABNORMAL LOW (ref 60–130)

## 2019-05-19 LAB — PREALBUMIN: Prealbumin: 10 mg/dL — ABNORMAL LOW (ref 21–43)

## 2019-05-19 MED ORDER — SODIUM CHLORIDE 0.9 % IV SOLN: 500.0000 mL | INTRAVENOUS | Status: DC

## 2019-05-19 NOTE — Op Note (Signed)
[place] Patient Name: Axil Morones Procedure Date: 05/19/2019 2:40 PM MRN: [id number] Endoscopist: Gerrit Heck , MD Age: 53 Referring MD:  Date of Birth: 03-04-1966 Gender: Male Account #: [id number] Procedure:                Colonoscopy Indications:              Screening for colorectal malignant neoplasm, This                            is the patient's first colonoscopy Medicines:                Monitored Anesthesia Care Procedure:                Pre-Anesthesia Assessment:                           - Prior to the procedure, a History and Physical                            was performed, and patient medications and                            allergies were reviewed. The patient's tolerance of                            previous anesthesia was also reviewed. The risks                            and benefits of the procedure and the sedation                            options and risks were discussed with the patient.                            All questions were answered, and informed consent                            was obtained. Prior Anticoagulants: The patient has                            taken no previous anticoagulant or antiplatelet                            agents. ASA Grade Assessment: III - A patient with                            severe systemic disease. After reviewing the risks                            and benefits, the patient was deemed in                            satisfactory condition to undergo the procedure.  After obtaining informed consent, the colonoscope                            was passed under direct vision. Throughout the                            procedure, the patient's blood pressure, pulse, and                            oxygen saturations were monitored continuously. The                            Colonoscope was introduced through the anus and                            advanced to the the terminal  ileum. The colonoscopy                            was performed without difficulty. The patient                            tolerated the procedure well. The quality of the                            bowel preparation was adequate. The terminal ileum,                            ileocecal valve, appendiceal orifice, and rectum                            were photographed. Scope In: 3:01:50 PM Scope Out: 3:19:50 PM Scope Withdrawal Time: 0 hours 14 minutes 14 seconds  Total Procedure Duration: 0 hours 18 minutes 0 seconds  Findings:                 The perianal and digital rectal examinations were                            normal.                           Five sessile polyps were found in the transverse                            colon (3) and ascending colon (2). The polyps were                            3 to 8 mm in size. These polyps were removed with a                            cold snare. Resection and retrieval were complete.                            Estimated blood loss was minimal.  A 5 mm polyp was found in the sigmoid colon. The                            polyp was sessile. The polyp was removed with a                            cold snare. Resection and retrieval were complete.                            Estimated blood loss was minimal.                           A 8 mm polyp was found in the rectum. The polyp was                            sessile. The polyp was removed with a cold snare.                            Resection and retrieval were complete. Estimated                            blood loss was minimal.                           Multiple small and large-mouthed diverticula were                            found in the sigmoid colon.                           Retroflexion in the right colon was performed.                           The terminal ileum appeared normal.                           Non-bleeding internal hemorrhoids were found  during                            retroflexion. The hemorrhoids were small. Complications:            No immediate complications. Estimated Blood Loss:     Estimated blood loss was minimal. Impression:               - Five 3 to 8 mm polyps in the transverse colon and                            in the ascending colon, removed with a cold snare.                            Resected and retrieved.                           - One 5 mm polyp in the sigmoid colon, removed  with                            a cold snare. Resected and retrieved.                           - One 8 mm polyp in the rectum, removed with a cold                            snare. Resected and retrieved.                           - Diverticulosis in the sigmoid colon.                           - The examined portion of the ileum was normal.                           - Non-bleeding internal hemorrhoids. Recommendation:           - Patient has a contact number available for                            emergencies. The signs and symptoms of potential                            delayed complications were discussed with the                            patient. Return to normal activities tomorrow.                            Written discharge instructions were provided to the                            patient.                           - Resume previous diet.                           - Continue present medications.                           - Await pathology results.                           - Repeat colonoscopy in 3 years for surveillance.                           - Return to GI office at appointment to be                            scheduled. Gerrit Heck, MD 05/19/2019 3:34:56 PM

## 2019-05-19 NOTE — Progress Notes (Signed)
[facility]. [place] temps, Lorrin Goodell VS

## 2019-05-19 NOTE — Progress Notes (Signed)
Called to room to assist during endoscopic procedure.  Patient ID and intended procedure confirmed with present staff. Received instructions for my participation in the procedure from the performing physician.

## 2019-05-19 NOTE — Op Note (Signed)
[place] Patient Name: Wesley [name] Procedure Date: 05/19/2019 2:41 PM MRN: [id number] Endoscopist: Gerrit Heck , MD Age: 53 Referring MD:  Date of Birth: 07-Jan-1966 Gender: Male Account #: [id number] Procedure:                Upper GI endoscopy Indications:              Acute Recurrent Pancreatitis, Weight loss, Zinc                            deficiency, B12 deficiency Medicines:                Monitored Anesthesia Care Procedure:                Pre-Anesthesia Assessment:                           - Prior to the procedure, a History and Physical                            was performed, and patient medications and                            allergies were reviewed. The patient's tolerance of                            previous anesthesia was also reviewed. The risks                            and benefits of the procedure and the sedation                            options and risks were discussed with the patient.                            All questions were answered, and informed consent                            was obtained. Prior Anticoagulants: The patient has                            taken no previous anticoagulant or antiplatelet                            agents. ASA Grade Assessment: III - A patient with                            severe systemic disease. After reviewing the risks                            and benefits, the patient was deemed in                            satisfactory condition to undergo the procedure.  After obtaining informed consent, the endoscope was                            passed under direct vision. Throughout the                            procedure, the patient's blood pressure, pulse, and                            oxygen saturations were monitored continuously. The                            Endoscope was introduced through the mouth, and                            advanced to the second part of  duodenum. The upper                            GI endoscopy was accomplished without difficulty.                            The patient tolerated the procedure well. Scope In: Scope Out: Findings:                 The examined esophagus was normal.                           Localized mild inflammation characterized by                            erythema was found in the gastric antrum. Biopsies                            were taken with a cold forceps for histology.                            Estimated blood loss was minimal.                           Diffuse mildly congested mucosa was found in the                            gastric fundus and in the gastric body. Biopsies                            were taken with a cold forceps for histology, to                            evaluate for H pylori and Pernicious Anemia.                            Estimated blood loss was minimal.  The duodenal bulb, first portion of the duodenum                            and second portion of the duodenum were normal.                            Biopsies for histology were taken with a cold                            forceps for evaluation of celiac disease. Estimated                            blood loss was minimal. Complications:            No immediate complications. Estimated Blood Loss:     Estimated blood loss was minimal. Impression:               - Normal esophagus.                           - Gastritis. Biopsied.                           - Congestive gastropathy. Biopsied.                           - Normal duodenal bulb, first portion of the                            duodenum and second portion of the duodenum.                            Biopsied. Recommendation:           - Patient has a contact number available for                            emergencies. The signs and symptoms of potential                            delayed complications were discussed with the                             patient. Return to normal activities tomorrow.                            Written discharge instructions were provided to the                            patient.                           - Resume previous diet.                           - Continue present medications.                           -  Await pathology results.                           - Return to GI clinic at appointment to be                            scheduled. Gerrit Heck, MD 05/19/2019 3:28:50 PM

## 2019-05-19 NOTE — Progress Notes (Signed)
Report to PACU, RN, vss, BBS= Clear.

## 2019-05-19 NOTE — Patient Instructions (Signed)
Repeat colonoscopy needed in 3 years.  YOU HAD AN ENDOSCOPIC PROCEDURE TODAY AT [facility]:   Refer to the procedure report that was given to you for any specific questions about what was found during the examination.  If the procedure report does not answer your questions, please call your gastroenterologist to clarify.  If you requested that your care partner not be given the details of your procedure findings, then the procedure report has been included in a sealed envelope for you to review at your convenience later.  YOU SHOULD EXPECT: Some feelings of bloating in the abdomen. Passage of more gas than usual.  Walking can help get rid of the air that was put into your GI tract during the procedure and reduce the bloating. If you had a lower endoscopy (such as a colonoscopy or flexible sigmoidoscopy) you may notice spotting of blood in your stool or on the toilet paper. If you underwent a bowel prep for your procedure, you may not have a normal bowel movement for a few days.  Please Note:  You might notice some irritation and congestion in your nose or some drainage.  This is from the oxygen used during your procedure.  There is no need for concern and it should clear up in a [place] or so.  SYMPTOMS TO REPORT IMMEDIATELY:   Following lower endoscopy (colonoscopy or flexible sigmoidoscopy):  Excessive amounts of blood in the stool  Significant tenderness or worsening of abdominal pains  Swelling of the abdomen that is new, acute  Fever of 100F or higher   Following upper endoscopy (EGD)  Vomiting of blood or coffee ground material  New chest pain or pain under the shoulder blades  Painful or persistently difficult swallowing  New shortness of breath  Fever of 100F or higher  Black, tarry-looking stools  For urgent or emergent issues, a gastroenterologist can be reached at any hour by calling [phone].   DIET:  We do recommend a small meal at first, but then you  may proceed to your regular diet.  Drink plenty of fluids but you should avoid alcoholic beverages for 24 hours. Try to increase the fiber in your diet, and drink plenty of water.  ACTIVITY:  You should plan to take it easy for the rest of today and you should NOT DRIVE or use heavy machinery until tomorrow (because of the sedation medicines used during the test).    FOLLOW UP: Our staff will call the number listed on your records 48-72 hours following your procedure to check on you and address any questions or concerns that you may have regarding the information given to you following your procedure. If we do not reach you, we will leave a message.  We will attempt to reach you two times.  During this call, we will ask if you have developed any symptoms of COVID 19. If you develop any symptoms (ie: fever, flu-like symptoms, shortness of breath, cough etc.) before then, please call [phone].  If you test positive for Covid 19 in the 2 weeks post procedure, please call and report this information to [place].    If any biopsies were taken you will be contacted by phone or by letter within the next 1-3 weeks.  Please call us at [phone] if you have not heard about the biopsies in 3 weeks.    SIGNATURES/CONFIDENTIALITY: You and/or your care partner have signed paperwork which will be entered into your electronic medical record.  These  signatures attest to the fact that that the information above on your After Visit Summary has been reviewed and is understood.  Full responsibility of the confidentiality of this discharge information lies with you and/or your care-partner.

## 2019-05-20 NOTE — Telephone Encounter (Addendum)
[facility] Patient Advocate Encounter  Patient has been approved for the [facility] program and will be active in the program until 11/14/2019.   Their pharmacy will contact the patient to set up first delivery  A [address] supply of the medication will be mailed to his home  Prior to the next refill, their pharmacy will contact the patient to schedule delivery and verify address  The company will notify clinic if they are unable to reach the patient for delivery verification Any questions about the process [phone]  Spoke to the patient on 06/19 to let him know of his approval into the program. As of today he had not heard from the pharmacy. Will try calling on Monday, June 22 to check on the status of the medication being shipped. After he receives the medication, we will have pharmacist counsel patient on medication and see if he has any additional questions.

## 2019-05-23 ENCOUNTER — Other Ambulatory Visit: Payer: Self-pay

## 2019-05-23 ENCOUNTER — Telehealth: Payer: Self-pay

## 2019-05-23 ENCOUNTER — Encounter: Payer: Self-pay | Admitting: Family Medicine

## 2019-05-23 ENCOUNTER — Telehealth: Payer: Self-pay | Admitting: *Deleted

## 2019-05-23 ENCOUNTER — Ambulatory Visit: Payer: Self-pay | Attending: Family Medicine | Admitting: Family Medicine

## 2019-05-23 VITALS — BP 113/80 | HR 67 | Temp 98.9°F | Ht 68.0 in | Wt 137.8 lb

## 2019-05-23 DIAGNOSIS — M5416 Radiculopathy, lumbar region: Secondary | ICD-10-CM

## 2019-05-23 DIAGNOSIS — E538 Deficiency of other specified B group vitamins: Secondary | ICD-10-CM

## 2019-05-23 DIAGNOSIS — R7309 Other abnormal glucose: Secondary | ICD-10-CM

## 2019-05-23 DIAGNOSIS — R739 Hyperglycemia, unspecified: Secondary | ICD-10-CM

## 2019-05-23 DIAGNOSIS — H6123 Impacted cerumen, bilateral: Secondary | ICD-10-CM

## 2019-05-23 DIAGNOSIS — R350 Frequency of micturition: Secondary | ICD-10-CM

## 2019-05-23 DIAGNOSIS — M79605 Pain in left leg: Secondary | ICD-10-CM

## 2019-05-23 DIAGNOSIS — G609 Hereditary and idiopathic neuropathy, unspecified: Secondary | ICD-10-CM

## 2019-05-23 DIAGNOSIS — R351 Nocturia: Secondary | ICD-10-CM

## 2019-05-23 LAB — POCT GLYCOSYLATED HEMOGLOBIN (HGB A1C): Hemoglobin A1C: 5.3 % (ref 4.0–5.6)

## 2019-05-23 LAB — POCT URINALYSIS DIP (CLINITEK)
Bilirubin, UA: NEGATIVE
Blood, UA: NEGATIVE
Glucose, UA: NEGATIVE mg/dL
Ketones, POC UA: NEGATIVE mg/dL
Leukocytes, UA: NEGATIVE
Nitrite, UA: NEGATIVE
POC PROTEIN,UA: NEGATIVE
Spec Grav, UA: 1.015
Urobilinogen, UA: 0.2 U/dL
pH, UA: 5.5

## 2019-05-23 LAB — GLUCOSE, POCT (MANUAL RESULT ENTRY): POC Glucose: 102 mg/dL — AB (ref 70–99)

## 2019-05-23 MED ORDER — VITAMIN B-12 1000 MCG PO TABS: 1000.0000 ug | ORAL_TABLET | Freq: Every day | ORAL | 6 refills | Status: DC

## 2019-05-23 MED ORDER — THIAMINE HCL 100 MG PO TABS: 100.0000 mg | ORAL_TABLET | Freq: Every day | ORAL | 6 refills | Status: DC

## 2019-05-23 MED ORDER — GABAPENTIN 300 MG PO CAPS: ORAL_CAPSULE | ORAL | 6 refills | Status: DC

## 2019-05-23 MED ORDER — PREDNISONE 20 MG PO TABS: ORAL_TABLET | ORAL | 0 refills | Status: DC

## 2019-05-23 NOTE — Telephone Encounter (Signed)
  Follow up Call-  Call back number 05/19/2019  Post procedure Call Back phone  # [phone]  Permission to leave phone message Yes  Some recent data might be hidden     Patient questions: Message left to call us if necessary.

## 2019-05-23 NOTE — Telephone Encounter (Signed)
  Follow up Call-  Call back number 05/19/2019  Post procedure Call Back phone  # [phone]  Permission to leave phone message Yes  Some recent data might be hidden     Patient questions:  Do you have a fever, pain , or abdominal swelling? No. Pain Score  0 *  Have you tolerated food without any problems? Yes.    Have you been able to return to your normal activities? Yes.    Do you have any questions about your discharge instructions: Diet   No. Medications  No. Follow up visit  No.  Do you have questions or concerns about your Care? No.  Actions: * If pain score is 4 or above: No action needed, pain <4.  1. Have you developed a fever since your procedure? no  2.   Have you had an respiratory symptoms (SOB or cough) since your procedure? no  3.   Have you tested positive for COVID 19 since your procedure no  4.   Have you had any family members/close contacts diagnosed with the COVID 19 since your procedure?  no   If yes to any of these questions please route to Joylene John, RN and Alphonsa Gin, Therapist, sports.

## 2019-05-23 NOTE — Patient Instructions (Signed)
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
## 2019-05-23 NOTE — Progress Notes (Signed)
Both legs been hurting really back, per pt he have a cane and a walker but dont use it, per pt his hearing comes and goes for no reason and it's worse in the morning.

## 2019-05-24 LAB — BASIC METABOLIC PANEL WITH GFR
BUN/Creatinine Ratio: 8 — ABNORMAL LOW (ref 9–20)
BUN: 7 mg/dL (ref 6–24)
CO2: 21 mmol/L (ref 20–29)
Calcium: 9.6 mg/dL (ref 8.7–10.2)
Chloride: 101 mmol/L (ref 96–106)
Creatinine, Ser: 0.91 mg/dL (ref 0.76–1.27)
GFR calc Af Amer: 110 mL/min/1.73
GFR calc non Af Amer: 95 mL/min/1.73
Glucose: 101 mg/dL — ABNORMAL HIGH (ref 65–99)
Potassium: 4 mmol/L (ref 3.5–5.2)
Sodium: 139 mmol/L (ref 134–144)

## 2019-05-24 LAB — PSA: Prostate Specific Ag, Serum: 2.3 ng/mL (ref 0.0–4.0)

## 2019-05-25 ENCOUNTER — Encounter: Payer: Self-pay | Admitting: Gastroenterology

## 2019-05-31 ENCOUNTER — Encounter: Payer: Self-pay | Admitting: Neurology

## 2019-06-01 ENCOUNTER — Other Ambulatory Visit: Payer: Self-pay

## 2019-06-01 ENCOUNTER — Telehealth: Payer: Self-pay | Admitting: Pharmacist

## 2019-06-01 ENCOUNTER — Telehealth (INDEPENDENT_AMBULATORY_CARE_PROVIDER_SITE_OTHER): Payer: Self-pay | Admitting: Neurology

## 2019-06-01 VITALS — Ht 68.0 in | Wt 135.0 lb

## 2019-06-01 DIAGNOSIS — G621 Alcoholic polyneuropathy: Secondary | ICD-10-CM

## 2019-06-01 DIAGNOSIS — E538 Deficiency of other specified B group vitamins: Secondary | ICD-10-CM
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
## 2019-06-01 NOTE — Telephone Encounter (Signed)
Patient is approved to receive Mavyret x 8 weeks for chronic Hepatitis C infection. Counseled patient to take all three tablets of Mavyret daily with food.  Counseled patient the need to take all three tablets together and to not separate them out during the Call. Encouraged patient not to miss any doses and explained how their chance of cure could go down with each dose missed. Counseled patient on what to do if dose is missed - if it is closer to the missed dose take immediately; if closer to next dose then skip dose and take the next dose at the usual time.   Counseled patient on common side effects such as headache, fatigue, and nausea and that these normally decrease with time. I reviewed patient medications and found no drug interactions. Discussed with patient that there are several drug interactions with Mavyret and instructed patient to call the clinic if he wishes to start a new medication during course of therapy. Also advised patient to call if he experiences any side effects. Patient will follow-up with me in the pharmacy clinic on 7/27.

## 2019-06-01 NOTE — Telephone Encounter (Signed)
Perfect thank you!

## 2019-06-07 ENCOUNTER — Encounter: Payer: Self-pay | Admitting: Neurology

## 2019-06-07 ENCOUNTER — Other Ambulatory Visit: Payer: Self-pay

## 2019-06-07 ENCOUNTER — Ambulatory Visit (INDEPENDENT_AMBULATORY_CARE_PROVIDER_SITE_OTHER): Payer: Self-pay | Admitting: Neurology

## 2019-06-07 VITALS — BP 120/65 | HR 93 | Ht 68.0 in | Wt 143.0 lb

## 2019-06-07 DIAGNOSIS — M5417 Radiculopathy, lumbosacral region: Secondary | ICD-10-CM

## 2019-06-07 DIAGNOSIS — M48062 Spinal stenosis, lumbar region with neurogenic claudication: Secondary | ICD-10-CM

## 2019-06-07 DIAGNOSIS — R292 Abnormal reflex: Secondary | ICD-10-CM

## 2019-06-07 NOTE — Procedures (Signed)
[facility]  [address], [place]  [place], [place] [address] Tel: [phone] Fax:  [phone] Test Date:  06/07/2019  Patient: Wesley [name] DOB: 1966-03-18 Physician: Narda Amber, DO  Sex: Male Height: 5' 8" Ref Phys: Narda Amber, DO  ID#: [id number] Temp: 34.8C Technician:    Patient Complaints: This is a 53 year old man with history of alcohol abuse and hepatitis C referred for evaluation of left leg pain and bilateral feet paresthesias.  NCV & EMG Findings: Electrodiagnostic testing of the right lower extremity and additional studies of the left shows: 1. Bilateral sural and superficial peroneal sensory responses are within normal limits. 2. Bilateral peroneal and tibial motor responses are within normal limits. 3. Bilateral tibial H reflex studies are within normal limits. 4. Chronic motor axonal loss changes are seen affecting bilateral L5 myotomes, worse on the left.  There is no evidence of accompanied active denervation.   Impression: 1. Chronic L5 radiculopathy affecting bilateral lower extremities, moderate in degree electrically and worse on the left. 2. There is no evidence of a large fiber sensorimotor polyneuropathy affecting the lower extremities.   ___________________________ Narda Amber, DO    Nerve Conduction Studies Anti Sensory Summary Table   Site NR Peak (ms) Norm Peak (ms) P-T Amp (V) Norm P-T Amp  Left Sup Peroneal Anti Sensory (Ant Lat Mall)  34.8C  12 cm    2.3 <4.6 10.4 >4  Right Sup Peroneal Anti Sensory (Ant Lat Mall)  34.8C  12 cm    2.6 <4.6 8.1 >4  Left Sural Anti Sensory (Lat Mall)  34.8C  Calf    3.4 <4.6 13.8 >4  Right Sural Anti Sensory (Lat Mall)  34.8C  Calf    3.3 <4.6 16.0 >4   Motor Summary Table   Site NR Onset (ms) Norm Onset (ms) O-P Amp (mV) Norm O-P Amp Site1 Site2 Delta-0 (ms) Dist (cm) Vel (m/s) Norm Vel (m/s)  Left Peroneal Motor (Ext Dig Brev)  34.8C  Ankle    2.8 <6.0 4.3 >2.5 B Fib Ankle  8.6 35.0 41 >40  B Fib    11.4  3.9  Poplt B Fib 1.8 8.0 44 >40  Poplt    13.2  3.8         Right Peroneal Motor (Ext Dig Brev)  34.8C  Ankle    3.3 <6.0 3.6 >2.5 B Fib Ankle 8.3 36.0 43 >40  B Fib    11.6  3.2  Poplt B Fib 1.6 8.0 50 >40  Poplt    13.2  3.0         Left Peroneal TA Motor (Tib Ant)  34.8C  Fib Head    3.8 <4.5 6.1 >3 Poplit Fib Head 1.6 8.0 50 >40  Poplit    5.4  6.1         Right Peroneal TA Motor (Tib Ant)  34.8C  Fib Head    3.0 <4.5 5.5 >3 Poplit Fib Head 1.3 8.0 62 >40  Poplit    4.3  5.0         Left Tibial Motor (Abd Hall Brev)  34.8C  Ankle    5.9 <6.0 16.2 >4 Knee Ankle 7.4 41.0 55 >40  Knee    13.3  9.5         Right Tibial Motor (Abd Hall Brev)  34.8C  Ankle    4.7 <6.0 11.1 >4 Knee Ankle 8.9 41.0 46 >40  Knee    13.6  6.8  H Reflex Studies   NR H-Lat (ms) Lat Norm (ms) L-R H-Lat (ms)  Left Tibial (Gastroc)  34.8C     33.06 <35 0.00  Right Tibial (Gastroc)  34.8C     33.06 <35 0.00   EMG   Side Muscle Ins Act Fibs Psw Fasc Number Recrt Dur Dur. Amp Amp. Poly Poly. Comment  Right AntTibialis Nml Nml Nml Nml 1- Rapid Some 1+ Some 1+ Nml Nml N/A  Right Gastroc _0  _1  Nml Nml N/A  Right Flex Dig Long Nml Nml Nml Nml 1- Rapid Some 1+ Some 1+ Nml Nml N/A  Right RectFemoris _2  _3  Nml Nml N/A  Right GluteusMed Nml Nml Nml Nml 1- Rapid Some 1+ Some 1+ Nml Nml N/A  Left GluteusMed Nml Nml Nml Nml 1- Rapid Some 1+ Some 1+ Nml Nml N/A  Left AntTibialis Nml Nml Nml Nml 2- Rapid Many 1+ Some 1+ Some 1+ N/A  Left Gastroc _4  _5  Nml Nml N/A  Left Lumbar PSP Nml Nml Nml Nml - - - - - - - - N/A  Left Flex Dig Long Nml Nml Nml Nml 3- Mod-R Some 1+ Some 1+ Nml Nml N/A  Left RectFemoris _6  _7  Nml Nml N/A      Waveforms:

## 2019-06-07 NOTE — Patient Instructions (Addendum)
MRI lumbar spine without contrast Monday July 13 at 230, go to [facility]  We will call you with the results and let you know the next step

## 2019-06-07 NOTE — Progress Notes (Signed)
Follow-up Visit   Date: 06/07/19   Wesley [name] MRN: [id number] DOB: 05-19-1966   Interim History: Wesley [name] is a 53 y.o. left-handed male with hepatitis C, alcohol abuse, and history of right radial nerve release returning to the clinic for follow-up of left > right leg pain.  The patient was accompanied to the clinic by self.  History of present illness: He was diagnosed with neuropathy in October 2018.  His symptoms started with numbness and burning sensation over the toes, soles of feet, and dorsum of the feet.  He feels as if his socks are rolled at the bottom of his toes.  He has noticed that he cannot wear flip-flops anymore because he cannot tell if his shoe is off of the foot.  He endorses imbalance, he has not had any falls and walks unassisted.  Over the past few weeks, he has developed sharp stinging pain in the left foot and at times radiates up his leg.  He does not report chronic low back pain.  This can be worse with prolonged standing.  He denies weakness of the arms or legs.  He does not have paresthesias in the hands.  He takes gabapentin 300 mg in the morning, 300 mg in the afternoon, and 600 mg at bedtime for pain which helps most of the time.  When severe, he takes tramadol.  His recent labs indicated vitamin B12 deficiency and he is now taking vitamin B 12 and B1 over-the-counter supplements.  No personal history of diabetes or family history of neuropathy.  He stopped drinking alcohol in early 2020.  He reports drinking 16 ounce of beer daily prior to this.  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  He is trying to get disability.   UPDATE 06/07/2019:  He is here for follow-up visit and EMG.  He continues to have ongoing numbness in the feet, but complains more so of the sharp and episodic left leg pain, involving the posterior thigh and lateral leg.  This pain is worse with prolonged walking or standing, such as waiting in a grocery  line or even when brushing his teeth, and he often has to raise his left leg up to get relief.  He always tries to use a shopping cart when at the grocery store as this tends to give him some relief.  He denies any leg cramps.     Medications:  Current Outpatient Medications on File Prior to Visit  Medication Sig Dispense Refill  . folic acid (FOLVITE) 1 MG tablet Take 1 mg by mouth daily.    Marland Kitchen gabapentin (NEURONTIN) 300 MG capsule 1 pill in the morning, 1 pill in the late afternoon, and 2 pills at bedtime 120 capsule 6  . Glecaprevir-Pibrentasvir (MAVYRET) 100-40 MG TABS Take 3 tablets by mouth daily with breakfast. 84 tablet 1  . Multiple Vitamin (MULTIVITAMIN) tablet Take 1 tablet by mouth daily.    . Multiple Vitamins-Minerals (ZINC PO) Take by mouth daily.    . naproxen (NAPROSYN) 500 MG tablet Take 500 mg by mouth 2 (two) times daily as needed for mild pain.    . predniSONE (DELTASONE) 20 MG tablet Take 2 pills once per [place] for 2 days then 1 pill daily for 2 days then 1/2 pill daily for 4 days. Take after eating 8 tablet 0  . thiamine 100 MG tablet Take 1 tablet (100 mg total) by mouth daily. 30 tablet  6  . traMADol (ULTRAM) 50 MG tablet TAKE 1 TABLET BY MOUTH EVERYDAY AT BEDTIME    . vitamin B-12 (CYANOCOBALAMIN) 1000 MCG tablet Take 1 tablet (1,000 mcg total) by mouth daily. Due to vitamin B12 deficiency 30 tablet 6   No current facility-administered medications on file prior to visit.     Allergies: No Known Allergies  Review of Systems:  CONSTITUTIONAL: No fevers, chills, night sweats, or weight loss.  EYES: No visual changes or eye pain ENT: No hearing changes.  No history of nose bleeds.   RESPIRATORY: No cough, wheezing and shortness of breath.   CARDIOVASCULAR: Negative for chest pain, and palpitations.   GI: Negative for abdominal discomfort, blood in stools or black stools.  No recent change in bowel habits.   GU:  No history of incontinence.   MUSCLOSKELETAL: No history  of joint pain or swelling.  No myalgias.   SKIN: Negative for lesions, rash, and itching.   ENDOCRINE: Negative for cold or heat intolerance, polydipsia or goiter.   PSYCH:  No depression or anxiety symptoms.   NEURO: As Above.   Vital Signs:  BP 120/65   Pulse 93   Ht 5' 8" (1.727 m)   Wt 143 lb (64.9 kg)   SpO2 98%   BMI 21.74 kg/m    General Medical Exam:   General:  Well appearing, comfortable  Eyes/ENT: see cranial nerve examination.   Neck:  No carotid bruits. Respiratory:  Clear to auscultation, good air entry bilaterally.   Cardiac:  Regular rate and rhythm, no murmur.   Ext:  No edema   Neurological Exam: MENTAL STATUS including orientation to time, place, person, recent and remote memory, attention span and concentration, language, and fund of knowledge is normal.  Speech is not dysarthric.  CRANIAL NERVES:  No visual field defects.  Pupils equal round and reactive to light.  Normal conjugate, extra-ocular eye movements in all directions of gaze.  No ptosis.  Face is symmetric. Palate elevates symmetrically.  Tongue is midline.  MOTOR:  Motor strength is 5/5 in all extremities, except mild distal weakness with toe flexion and extension 5-/5.  No atrophy, fasciculations or abnormal movements.  No pronator drift.  Tone is mildly increased in the legs.    MSRs:                                           Right        Left brachioradialis 2+  2+  biceps 2+  2+  triceps 2+  2+  patellar 3+  3+  ankle jerk 2+  2+  Hoffman no  no  plantar response down  down   SENSORY:  Intact to vibration and temperature throughout.  COORDINATION/GAIT:  Normal finger-to- nose-finger.  Intact rapid alternating movements bilaterally.  Gait appears slow and slightly wide-based   Data: NCS/EMG of the legs 06/07/2019: 1. Chronic L5 radiculopathy affecting bilateral lower extremities, moderate in degree electrically and worse on the left. 2. There is no evidence of a large fiber sensorimotor  polyneuropathy affecting the lower extremities.   IMPRESSION/PLAN: 1.  Neurogenic claudication due to probable lumbar canal stenosis.  His exam shows hyperreflexia in the legs, mildly increased tone, and distal weakness.  Electrodiagnostic testing shows chronic L5 radiculopathy, worse on the left.  To my surprise, there is no evidence of a large fiber sensorimotor polyneuropathy,  which I had suspected based on his history during his initial virtual visit.  In fact, his sensory and motor nerve conduction component of testing looks remarkably well.  Therefore, imaging of his lumbar spine is indicated to look for nerve compression.   -MRI lumbar spine without contrast  -Continue gabapentin 300 mg in the morning, 300 mg in the afternoon, and 600 mg at bedtime.  2.  Possible small fiber neuropathy causing bilateral feet paresthesias.    Further recommendations pending results.   Thank you for allowing me to participate in patient's care.  If I can answer any additional questions, I would be pleased to do so.    Sincerely,    Anureet Bruington K. Posey Pronto, DO

## 2019-06-13 ENCOUNTER — Other Ambulatory Visit: Payer: Self-pay

## 2019-06-13 ENCOUNTER — Ambulatory Visit (HOSPITAL_COMMUNITY)
Admission: RE | Admit: 2019-06-13 | Discharge: 2019-06-13 | Disposition: A | Discharge status: Home / Self Care | Payer: Self-pay | Source: Ambulatory Visit | Attending: Neurology | Admitting: Neurology

## 2019-06-13 DIAGNOSIS — M48062 Spinal stenosis, lumbar region with neurogenic claudication: Secondary | ICD-10-CM | POA: Insufficient documentation

## 2019-06-13 DIAGNOSIS — R292 Abnormal reflex: Secondary | ICD-10-CM | POA: Insufficient documentation

## 2019-06-14 ENCOUNTER — Other Ambulatory Visit: Payer: Self-pay | Admitting: Family Medicine

## 2019-06-14 DIAGNOSIS — G609 Hereditary and idiopathic neuropathy, unspecified: Secondary | ICD-10-CM

## 2019-06-15 ENCOUNTER — Other Ambulatory Visit: Payer: Self-pay

## 2019-06-15 DIAGNOSIS — M48062 Spinal stenosis, lumbar region with neurogenic claudication: Secondary | ICD-10-CM

## 2019-06-22 ENCOUNTER — Other Ambulatory Visit: Payer: Self-pay

## 2019-06-22 DIAGNOSIS — I82891 Chronic embolism and thrombosis of other specified veins: Secondary | ICD-10-CM

## 2019-06-22 DIAGNOSIS — M79605 Pain in left leg: Secondary | ICD-10-CM

## 2019-06-22 DIAGNOSIS — M7989 Other specified soft tissue disorders: Secondary | ICD-10-CM

## 2019-06-27 ENCOUNTER — Ambulatory Visit (INDEPENDENT_AMBULATORY_CARE_PROVIDER_SITE_OTHER): Payer: Self-pay | Admitting: Pharmacist

## 2019-06-27 ENCOUNTER — Other Ambulatory Visit: Payer: Self-pay

## 2019-06-27 DIAGNOSIS — B182 Chronic viral hepatitis C: Secondary | ICD-10-CM

## 2019-06-27 NOTE — Progress Notes (Signed)
HPI: Wesley [name] is a 53 y.o. male who presents to the [facility] for Hepatitis C follow-up.  Medication: Mavyret x 8 weeks  Start Date: 6/27  Hepatitis C Genotype: 2/3  Fibrosis Score: F3  Hepatitis C RNA: 1.8 million on 04/11/2019  Patient Active Problem List   Diagnosis Date Noted   Chronic viral hepatitis C ([place]) 05/04/2019   Chronic thrombosis of splenic vein 04/10/2019   Alcohol dependence in remission ([place]) 04/10/2019   Acute gallstone pancreatitis 12/14/2018   Malnutrition of moderate degree 12/13/2018   Abnormal LFTs 12/08/2018   Refeeding syndrome 02/09/2018   Cholelithiasis 02/08/2018   Chronic pain syndrome 02/08/2018   Pancreatitis, recurrent 02/07/2018   Alcohol abuse 02/07/2018   Neuropathy 12/06/2017   Routine health maintenance 12/26/2011   SMOKER 08/07/2010   EMPHYSEMATOUS BLEB 08/07/2010   COPD 08/07/2010   Pneumothorax 08/07/2010   SPONTANEOUS PNEUMOTHORAX 08/07/2010    Patient's Medications  New Prescriptions   No medications on file  Previous Medications   FOLIC ACID (FOLVITE) 1 MG TABLET    Take 1 mg by mouth daily.   GABAPENTIN (NEURONTIN) 300 MG CAPSULE    1 pill in the morning, 1 pill in the late afternoon, and 2 pills at bedtime   GLECAPREVIR-PIBRENTASVIR (MAVYRET) 100-40 MG TABS    Take 3 tablets by mouth daily with breakfast.   MULTIPLE VITAMIN (MULTIVITAMIN) TABLET    Take 1 tablet by mouth daily.   MULTIPLE VITAMINS-MINERALS (ZINC PO)    Take by mouth daily.   NAPROXEN (NAPROSYN) 500 MG TABLET    Take 500 mg by mouth 2 (two) times daily as needed for mild pain.   PREDNISONE (DELTASONE) 20 MG TABLET    Take 2 pills once per [place] for 2 days then 1 pill daily for 2 days then 1/2 pill daily for 4 days. Take after eating   THIAMINE 100 MG TABLET    Take 1 tablet (100 mg total) by mouth daily.   TRAMADOL (ULTRAM) 50 MG TABLET    TAKE 1 TABLET BY MOUTH EVERYDAY AT BEDTIME   VITAMIN B-12 (CYANOCOBALAMIN) 1000 MCG  TABLET    Take 1 tablet (1,000 mcg total) by mouth daily. Due to vitamin B12 deficiency  Modified Medications   No medications on file  Discontinued Medications   No medications on file    Allergies: No Known Allergies  Past Medical History: Past Medical History:  Diagnosis Date   Acute pancreatitis    Alcohol abuse    Cholecystitis 01/2018   Chronic hepatitis C ([place])    History of blood transfusion    Neuropathy    Radial nerve compression    right    Social History: Social History   Socioeconomic History   Marital status: Single    Spouse name: Not on file   Number of children: 1   Years of education: Not on file   Highest education level: Not on file  Occupational History   Occupation: unemployed - fired for drinking on the job    Comment: Scientist, water quality strain: Not on file   Food insecurity    Worry: Not on file    Inability: Not on file   Transportation needs    Medical: Not on file    Non-medical: Not on file  Tobacco Use   Smoking status: Current Every [place] Smoker    Packs/Debold: 0.75    Years: 42.00    Pack  years: 31.50    Types: Cigarettes   Smokeless tobacco: Never Used  Substance and Sexual Activity   Alcohol use: Not Currently    Alcohol/week: 25.0 standard drinks    Types: 25 Standard drinks or equivalent per week    Comment: stopped 04/2019   Drug use: No   Sexual activity: Yes    Partners: Female    Birth control/protection: None  Lifestyle   Physical activity    Days per week: Not on file    Minutes per session: Not on file   Stress: Not on file  Relationships   Social connections    Talks on phone: Not on file    Gets together: Not on file    Attends religious service: Not on file    Active member of club or organization: Not on file    Attends meetings of clubs or organizations: Not on file    Relationship status: Not on file  Other Topics Concern   Not on file    Social History Narrative   Left handed      Lives in a single story home with fiance.    Labs: Hepatitis C Lab Results  Component Value Date   HCVGENOTYPE see note 05/02/2019   FIBROSTAGE F3 05/02/2019   Hepatitis B Lab Results  Component Value Date   HEPBSAB NON-REACTIVE 05/02/2019   HEPBSAG NON-REACTIVE 05/02/2019   Hepatitis A Lab Results  Component Value Date   HAV NON-REACTIVE 05/02/2019   HIV Lab Results  Component Value Date   HIV Non Reactive 12/08/2018   HIV Non Reactive 02/08/2018   HIV NON REACTIVE 07/24/2010   Lab Results  Component Value Date   CREATININE 0.91 05/23/2019   CREATININE 0.82 05/06/2019   CREATININE 0.88 04/26/2019   CREATININE 0.67 04/14/2019   CREATININE 0.71 04/13/2019   Lab Results  Component Value Date   AST 36 05/06/2019   AST 41 (H) 05/02/2019   AST 36 04/26/2019   ALT 23 05/06/2019   ALT 24 05/02/2019   ALT 22 05/02/2019   INR 1.0 05/02/2019   INR 1.17 12/08/2018    Assessment: Rami is here today to follow-up for his chronic Hepatitis C infection.  He started Mavyret x 8 weeks at the end of June.  He has already received and started his 2nd and final month from the manufacturer.  He has around 3.5 weeks left of treatment. No issues today or side effects from the [place].  He hasn't missed any doses and takes all three tablets together around lunch time every [place]. He has no complaints today.  Congratulated him on his perfect adherence and encouraged continued compliance.  Will check labs and bring him back when he has completed treatment. He filled out a [facility] lab assistance form today as well for lab coverage since he is uninsured.   Plan: - Continue Mavyret x 8 weeks - HCV RNA today - F/u with me again 8/25 at [place]. Luv Mish, PharmD, BCIDP, AAHIVP, [facility] 06/28/2019, 11:21 AM

## 2019-06-29 ENCOUNTER — Encounter: Payer: Self-pay | Admitting: Vascular Surgery

## 2019-06-29 ENCOUNTER — Encounter (HOSPITAL_COMMUNITY): Payer: Self-pay

## 2019-07-02 LAB — HEPATITIS C RNA QUANTITATIVE
HCV Quantitative Log: <1.18 Log IU/mL
HCV RNA, PCR, QN: <15 IU/mL

## 2019-07-05 ENCOUNTER — Ambulatory Visit: Payer: Self-pay | Admitting: Neurology

## 2019-07-05 ENCOUNTER — Encounter: Payer: Self-pay | Admitting: Neurology

## 2019-07-16 ENCOUNTER — Other Ambulatory Visit: Payer: Self-pay | Admitting: Family Medicine

## 2019-07-16 DIAGNOSIS — G609 Hereditary and idiopathic neuropathy, unspecified: Secondary | ICD-10-CM

## 2019-07-20 ENCOUNTER — Ambulatory Visit (INDEPENDENT_AMBULATORY_CARE_PROVIDER_SITE_OTHER): Payer: Self-pay | Admitting: Vascular Surgery

## 2019-07-20 ENCOUNTER — Other Ambulatory Visit: Payer: Self-pay

## 2019-07-20 ENCOUNTER — Ambulatory Visit (HOSPITAL_COMMUNITY)
Admission: RE | Admit: 2019-07-20 | Discharge: 2019-07-20 | Disposition: A | Discharge status: Home / Self Care | Payer: Self-pay | Source: Ambulatory Visit | Attending: Vascular Surgery | Admitting: Vascular Surgery

## 2019-07-20 ENCOUNTER — Encounter: Payer: Self-pay | Admitting: Vascular Surgery

## 2019-07-20 VITALS — BP 121/77 | HR 66 | Temp 97.9°F | Resp 14 | Ht 68.0 in | Wt 153.0 lb

## 2019-07-20 DIAGNOSIS — M79605 Pain in left leg: Secondary | ICD-10-CM

## 2019-07-20 DIAGNOSIS — M7989 Other specified soft tissue disorders: Secondary | ICD-10-CM | POA: Insufficient documentation

## 2019-07-20 DIAGNOSIS — I739 Peripheral vascular disease, unspecified: Secondary | ICD-10-CM

## 2019-07-20 NOTE — Progress Notes (Signed)
REASON FOR CONSULT:    Left lower extremity pain with nonpalpable pulses.  The consult is requested by Dr. Chapman Fitch.   ASSESSMENT & PLAN:   LEFT LEG PAIN: I think the patient's leg pain is likely neurogenic.  I reassured him that he has no evidence of significant arterial insufficiency.  He has palpable pedal pulses, normal Doppler signals, and normal ABIs.  We have discussed however the importance of tobacco cessation.  I explained that given that he is young if he quit smoking his chance of having significant arterial problems in the future is small.  However if he continues to smoke he will likely develop significant peripheral vascular disease.  He will likely follow-up with his neurosurgeon to discuss options for his pinched nerve in his back.  Deitra Mayo, MD, FACS [facility] [phone] Office: [phone]   HPI:   Wesley [name] is a pleasant 53 y.o. male, who is referred with left leg pain.  I have reviewed the office note from the referring office.  Patient is followed with multiple chronic issues.  These include chronic pancreatitis, lumbar radiculopathy, B12 deficiency, and elevated blood sugars.  At the time of this visit the patient was complaining of some left leg pain.  It was felt that he may have some underlying peripheral vascular disease and was sent for vascular consultation.  The patient describes pain in both legs since 2018.  His symptoms are more significant on the left side.  He does experience some worsening of his symptoms with ambulation but his pain also occurs sometimes with sitting and standing.  He has been evaluated by neurosurgery and was told that he had a pinched nerve in his back.  He was to have an injection for this but elected not to proceed with this.  His neurosurgeon is Dr. Kathyrn Sheriff,.  His only real risk factor for peripheral vascular disease is his smoking.  He smokes 1 pack/[place].  He denies any history of diabetes, hypertension, hypercholesterolemia, or  family history of premature cardiovascular disease.  Past Medical History:  Diagnosis Date  . Acute pancreatitis   . Alcohol abuse   . Cholecystitis 01/2018  . Chronic hepatitis C ([place])   . History of blood transfusion   . Neuropathy   . Radial nerve compression    right    Family History  Problem Relation Age of Onset  . Hypertension Mother   . Cirrhosis Father 68  . Kidney cancer Father   . Multiple sclerosis Sister   . Liver cancer Maternal Grandmother   . Colon cancer Cousin 34       Mother's niece  . Esophageal cancer Neg Hx   . Stomach cancer Neg Hx   . Rectal cancer Neg Hx     SOCIAL HISTORY: Social History   Socioeconomic History  . Marital status: Single    Spouse name: Not on file  . Number of children: 1  . Years of education: Not on file  . Highest education level: Not on file  Occupational History  . 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 Financial resource strain: Not on file  . Food insecurity    Worry: Not on file    Inability: Not on file  . Transportation needs    Medical: Not on file    Non-medical: Not on file  Tobacco Use  . Smoking status: Current Every [place] Smoker    Packs/Vita:  0.75    Years: 42.00    Pack years: 31.50    Types: Cigarettes  . Smokeless tobacco: Never Used  Substance and Sexual Activity  . Alcohol use: Not Currently    Alcohol/week: 25.0 standard drinks    Types: 25 Standard drinks or equivalent per week    Comment: stopped 04/2019  . Drug use: No  . Sexual activity: Yes    Partners: Female    Birth control/protection: None  Lifestyle  . Physical activity    Days per week: Not on file    Minutes per session: Not on file  . Stress: Not on file  Relationships  . Social Herbalist on phone: Not on file    Gets together: Not on file    Attends religious service: Not on file    Active member of club or organization: Not on file     Attends meetings of clubs or organizations: Not on file    Relationship status: Not on file  . Intimate partner violence    Fear of current or ex partner: Not on file    Emotionally abused: Not on file    Physically abused: Not on file    Forced sexual activity: Not on file  Other Topics Concern  . Not on file  Social History Narrative   Left handed      Lives in a single story home with fiance.    No Known Allergies  Current Outpatient Medications  Medication Sig Dispense Refill  . folic acid (FOLVITE) 1 MG tablet Take 1 mg by mouth daily.    Marland Kitchen gabapentin (NEURONTIN) 300 MG capsule TAKE 1 CAPSULE IN THE MORNING, 1 CAP IN THE LATE AFTERNOON, AND 2 CAPS AT BEDTIME 360 capsule 3  . Glecaprevir-Pibrentasvir (MAVYRET) 100-40 MG TABS Take 3 tablets by mouth daily with breakfast. 84 tablet 1  . Multiple Vitamin (MULTIVITAMIN) tablet Take 1 tablet by mouth daily.    . Multiple Vitamins-Minerals (ZINC PO) Take by mouth daily.    . naproxen (NAPROSYN) 500 MG tablet Take 500 mg by mouth 2 (two) times daily as needed for mild pain.    . predniSONE (DELTASONE) 20 MG tablet Take 2 pills once per [place] for 2 days then 1 pill daily for 2 days then 1/2 pill daily for 4 days. Take after eating 8 tablet 0  . thiamine 100 MG tablet Take 1 tablet (100 mg total) by mouth daily. 30 tablet 6  . traMADol (ULTRAM) 50 MG tablet TAKE 1 TABLET BY MOUTH EVERYDAY AT BEDTIME    . vitamin B-12 (CYANOCOBALAMIN) 1000 MCG tablet Take 1 tablet (1,000 mcg total) by mouth daily. Due to vitamin B12 deficiency 30 tablet 6   No current facility-administered medications for this visit.     REVIEW OF SYSTEMS:  _0  denotes positive finding, _1  denotes negative finding Cardiac  Comments:  Chest pain or chest pressure:    Shortness of breath upon exertion:    Short of breath when lying flat:    Irregular heart rhythm:        Vascular    Pain in calf, thigh, or hip brought on by ambulation:    Pain in feet at night that  wakes you up from your sleep:     Blood clot in your veins:    Leg swelling:         Pulmonary    Oxygen at home:    Productive cough:  Wheezing:         Neurologic    Sudden weakness in arms or legs:     Sudden numbness in arms or legs:     Sudden onset of difficulty speaking or slurred speech:    Temporary loss of vision in one eye:     Problems with dizziness:         Gastrointestinal    Blood in stool:     Vomited blood:         Genitourinary    Burning when urinating:     Blood in urine:        Psychiatric    Major depression:         Hematologic    Bleeding problems:    Problems with blood clotting too easily:        Skin    Rashes or ulcers:        Constitutional    Fever or chills:     PHYSICAL EXAM:   Vitals:   07/20/19 1038  BP: 121/77  Pulse: 66  Resp: 14  Temp: 97.9 F (36.6 C)  TempSrc: Temporal  SpO2: 98%  Weight: 153 lb (69.4 kg)  Height: 5' 8" (1.727 m)    GENERAL: The patient is a well-nourished male, in no acute distress. The vital signs are documented above. CARDIAC: There is a regular rate and rhythm.  VASCULAR: I do not detect carotid bruits. He has palpable femoral, popliteal, and pedal pulses bilaterally. He has no significant lower extremity swelling. He has no ischemic ulcers. PULMONARY: There is good air exchange bilaterally without wheezing or rales. ABDOMEN: Soft and non-tender with normal pitched bowel sounds.  MUSCULOSKELETAL: There are no major deformities or cyanosis. NEUROLOGIC: No focal weakness or paresthesias are detected. SKIN: There are no ulcers or rashes noted. PSYCHIATRIC: The patient has a normal affect.  DATA:    ARTERIAL DOPPLER STUDY: I have independently interpreted his arterial Doppler study today.  On the right side there is a triphasic dorsalis pedis and posterior tibial signal.  ABI is 100%.  Toe pressure is 109 mmHg.  On the left side there is a triphasic dorsalis pedis and posterior tibial  signal.  ABI is 100%.  Toe pressure is 107 mmHg.  LABS: I reviewed his labs from 05/23/2019.  Creatinine was 0.91.  GFR was 110.

## 2019-07-25 ENCOUNTER — Ambulatory Visit: Payer: Self-pay | Admitting: Family Medicine

## 2019-07-26 ENCOUNTER — Other Ambulatory Visit: Payer: Self-pay

## 2019-07-26 ENCOUNTER — Ambulatory Visit (INDEPENDENT_AMBULATORY_CARE_PROVIDER_SITE_OTHER): Payer: Self-pay | Admitting: Pharmacist

## 2019-07-26 DIAGNOSIS — B182 Chronic viral hepatitis C: Secondary | ICD-10-CM

## 2019-07-26 NOTE — Progress Notes (Signed)
HPI: Wesley [name] is a 53 y.o. male who presents to the [facility] for Hepatitis C follow-up.  Medication: Mavyret x 8 weeks  Start Date: 6/27  Hepatitis C Genotype: 2/3  Fibrosis Score: F3  Hepatitis C RNA: 1.8 million on 04/11/2019; <15 on 06/27/2019  Patient Active Problem List   Diagnosis Date Noted  . Chronic viral hepatitis C ([place]) 05/04/2019  . Chronic thrombosis of splenic vein 04/10/2019  . Alcohol dependence in remission ([place]) 04/10/2019  . Acute gallstone pancreatitis 12/14/2018  . Malnutrition of moderate degree 12/13/2018  . Abnormal LFTs 12/08/2018  . Refeeding syndrome 02/09/2018  . Cholelithiasis 02/08/2018  . Chronic pain syndrome 02/08/2018  . Pancreatitis, recurrent 02/07/2018  . Alcohol abuse 02/07/2018  . Neuropathy 12/06/2017  . Routine health maintenance 12/26/2011  . SMOKER 08/07/2010  . EMPHYSEMATOUS BLEB 08/07/2010  . COPD 08/07/2010  . Pneumothorax 08/07/2010  . SPONTANEOUS PNEUMOTHORAX 08/07/2010    Patient's Medications  New Prescriptions   No medications on file  Previous Medications   FOLIC ACID (FOLVITE) 1 MG TABLET    Take 1 mg by mouth daily.   GABAPENTIN (NEURONTIN) 300 MG CAPSULE    TAKE 1 CAPSULE IN THE MORNING, 1 CAP IN THE LATE AFTERNOON, AND 2 CAPS AT BEDTIME   GLECAPREVIR-PIBRENTASVIR (MAVYRET) 100-40 MG TABS    Take 3 tablets by mouth daily with breakfast.   MULTIPLE VITAMIN (MULTIVITAMIN) TABLET    Take 1 tablet by mouth daily.   MULTIPLE VITAMINS-MINERALS (ZINC PO)    Take by mouth daily.   NAPROXEN (NAPROSYN) 500 MG TABLET    Take 500 mg by mouth 2 (two) times daily as needed for mild pain.   PREDNISONE (DELTASONE) 20 MG TABLET    Take 2 pills once per [place] for 2 days then 1 pill daily for 2 days then 1/2 pill daily for 4 days. Take after eating   THIAMINE 100 MG TABLET    Take 1 tablet (100 mg total) by mouth daily.   TRAMADOL (ULTRAM) 50 MG TABLET    TAKE 1 TABLET BY MOUTH EVERYDAY AT BEDTIME   VITAMIN B-12  (CYANOCOBALAMIN) 1000 MCG TABLET    Take 1 tablet (1,000 mcg total) by mouth daily. Due to vitamin B12 deficiency  Modified Medications   No medications on file  Discontinued Medications   No medications on file    Allergies: No Known Allergies  Past Medical History: Past Medical History:  Diagnosis Date  . Acute pancreatitis   . Alcohol abuse   . Cholecystitis 01/2018  . Chronic hepatitis C ([place])   . History of blood transfusion   . Neuropathy   . Radial nerve compression    right    Social History: Social History   Socioeconomic History  . Marital status: Single    Spouse name: Not on file  . Number of children: 1  . Years of education: Not on file  . Highest education level: Not on file  Occupational History  . 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 Financial resource strain: Not on file  . Food insecurity    Worry: Not on file    Inability: Not on file  . Transportation needs    Medical: Not on file    Non-medical: Not on file  Tobacco Use  . Smoking status: Current Every [place] Smoker    Packs/[place]: 0.75    Years: 42.00  Pack years: 31.50    Types: Cigarettes  . Smokeless tobacco: Never Used  Substance and Sexual Activity  . Alcohol use: Not Currently    Alcohol/week: 25.0 standard drinks    Types: 25 Standard drinks or equivalent per week    Comment: stopped 04/2019  . Drug use: No  . Sexual activity: Yes    Partners: Female    Birth control/protection: None  Lifestyle  . Physical activity    Days per week: Not on file    Minutes per session: Not on file  . Stress: Not on file  Relationships  . Social Herbalist on phone: Not on file    Gets together: Not on file    Attends religious service: Not on file    Active member of club or organization: Not on file    Attends meetings of clubs or organizations: Not on file    Relationship status: Not on file  Other Topics  Concern  . Not on file  Social History Narrative   Left handed      Lives in a single story home with fiance.    Labs: Hepatitis C Lab Results  Component Value Date   HCVGENOTYPE see note 05/02/2019   HCVRNAPCRQN <15 NOT DETECTED 06/27/2019   FIBROSTAGE F3 05/02/2019   Hepatitis B Lab Results  Component Value Date   HEPBSAB NON-REACTIVE 05/02/2019   HEPBSAG NON-REACTIVE 05/02/2019   Hepatitis A Lab Results  Component Value Date   HAV NON-REACTIVE 05/02/2019   HIV Lab Results  Component Value Date   HIV Non Reactive 12/08/2018   HIV Non Reactive 02/08/2018   HIV NON REACTIVE 07/24/2010   Lab Results  Component Value Date   CREATININE 0.91 05/23/2019   CREATININE 0.82 05/06/2019   CREATININE 0.88 04/26/2019   CREATININE 0.67 04/14/2019   CREATININE 0.71 04/13/2019   Lab Results  Component Value Date   AST 36 05/06/2019   AST 41 (H) 05/02/2019   AST 36 04/26/2019   ALT 23 05/06/2019   ALT 24 05/02/2019   ALT 22 05/02/2019   INR 1.0 05/02/2019   INR 1.17 12/08/2018    Assessment: Wesley [name] is here today for his end of therapy appointment for his chronic Hepatitis C infection.  He completed Mavyret x 8 weeks last Wednesday. He missed no doses and had no issues talking it or tolerating it. His early on treatment Hep C RNA was already undetectable.  Will check again today and see him back in 3 months for his cure visit. All questions answered.   Plan: - Completed Mavyret x 8 weeks - Hep C RNA, CMET, CBC today - F/u with me again 11/30 at [address]. Cheria Sadiq, PharmD, BCIDP, AAHIVP, [facility] 07/26/2019, 4:02 PM

## 2019-07-27 ENCOUNTER — Ambulatory Visit: Payer: Self-pay | Attending: Family Medicine | Admitting: Family Medicine

## 2019-07-27 ENCOUNTER — Other Ambulatory Visit: Payer: Self-pay

## 2019-07-27 ENCOUNTER — Encounter: Payer: Self-pay | Admitting: Family Medicine

## 2019-07-27 VITALS — BP 114/78 | HR 92 | Temp 98.5°F | Ht 68.0 in | Wt 151.0 lb

## 2019-07-27 DIAGNOSIS — R351 Nocturia: Secondary | ICD-10-CM

## 2019-07-27 DIAGNOSIS — M48062 Spinal stenosis, lumbar region with neurogenic claudication: Secondary | ICD-10-CM

## 2019-07-27 DIAGNOSIS — N401 Enlarged prostate with lower urinary tract symptoms: Secondary | ICD-10-CM

## 2019-07-27 MED ORDER — GABAPENTIN 300 MG PO CAPS: ORAL_CAPSULE | ORAL | 3 refills | Status: DC

## 2019-07-27 MED ORDER — TAMSULOSIN HCL 0.4 MG PO CAPS: 0.4000 mg | ORAL_CAPSULE | Freq: Every day | ORAL | 3 refills | Status: DC

## 2019-07-27 MED FILL — GABAPENTIN 300 MG CAPSULE: 300 | 30 days supply | Qty: 180 | Fill #0

## 2019-07-27 MED FILL — TAMSULOSIN HCL 0.4 MG CAP: 0.4 | 30 days supply | Qty: 30 | Fill #0

## 2019-07-27 NOTE — Patient Instructions (Signed)
Spinal Stenosis  Spinal stenosis happens when the open space (spinal canal) between the bones of your spine (vertebrae) gets smaller. It is caused by bone pushing into the open spaces of your backbone (spine). This puts pressure on your backbone and the nerves in your backbone. Treatment often focuses on managing any pain and symptoms. In some cases, surgery may be needed. Follow these instructions at home: Managing pain, stiffness, and swelling   Do all exercises and stretches as told by your doctor.  Stand and sit up straight (use good posture). If you were given a brace or a corset, wear it as told by your doctor.  Do not do any activities that cause pain. Ask your doctor what activities are safe for you.  Do not lift anything that is heavier than 10 lb (4.5 kg) or heavier than your doctor tells you.  Try to stay at a healthy weight. Talk with your doctor if you need help losing weight.  If directed, put heat on the affected area as often as told by your doctor. Use the heat source that your doctor recommends, such as a moist heat pack or a heating pad. ? Put a towel between your skin and the heat source. ? Leave the heat on for 20-30 minutes. ? Remove the heat if your skin turns bright red. This is especially important if you are not able to feel pain, heat, or cold. You may have a greater risk of getting burned. General instructions  Take over-the-counter and prescription medicines only as told by your doctor.  Do not use any products that contain nicotine or tobacco, such as cigarettes and e-cigarettes. If you need help quitting, ask your doctor.  Eat a healthy diet. This includes plenty of fruits and vegetables, whole grains, and low-fat (lean) protein.  Keep all follow-up visits as told by your doctor. This is important. Contact a doctor if:  Your symptoms do not get better.  Your symptoms get worse.  You have a fever. Get help right away if:  You have new or worse pain  in your neck or upper back.  You have very bad pain that medicine does not control.  You are dizzy.  You have vision problems, blurred vision, or double vision.  You have a very bad headache that is worse when you stand.  You feel sick to your stomach (nauseous).  You throw up (vomit).  You have new or worse numbness or tingling in your back or legs.  You have pain, redness, swelling, or warmth in your arm or leg. Summary  Spinal stenosis happens when the open space (spinal canal) between the bones of your spine gets smaller (narrow).  Contact a doctor if your symptoms get worse.  In some cases, surgery may be needed. This information is not intended to replace advice given to you by your health care provider. Make sure you discuss any questions you have with your health care provider. Document Released: 03/13/2011 Document Revised: 10/30/2017 Document Reviewed: 10/22/2016 Elsevier Patient Education  2020 [employer].  Benign Prostatic Hyperplasia  Benign prostatic hyperplasia (BPH) is an enlarged prostate gland that is caused by the normal aging process and not by cancer. The prostate is a walnut-sized gland that is involved in the production of semen. It is located in front of the rectum and below the bladder. The bladder stores urine and the urethra is the tube that carries the urine out of the body. The prostate may get bigger as a man  gets older. An enlarged prostate can press on the urethra. This can make it harder to pass urine. The build-up of urine in the bladder can cause infection. Back pressure and infection may progress to bladder damage and kidney (renal) failure. What are the causes? This condition is part of a normal aging process. However, not all men develop problems from this condition. If the prostate enlarges away from the urethra, urine flow will not be blocked. If it enlarges toward the urethra and compresses it, there will be problems passing urine. What  increases the risk? This condition is more likely to develop in men over the age of 58 years. What are the signs or symptoms? Symptoms of this condition include:  Getting up often during the night to urinate.  Needing to urinate frequently during the [place].  Difficulty starting urine flow.  Decrease in size and strength of your urine stream.  Leaking (dribbling) after urinating.  Inability to pass urine. This needs immediate treatment.  Inability to completely empty your bladder.  Pain when you pass urine. This is more common if there is also an infection.  Urinary tract infection (UTI). How is this diagnosed? This condition is diagnosed based on your medical history, a physical exam, and your symptoms. Tests will also be done, such as:  A post-void bladder scan. This measures any amount of urine that may remain in your bladder after you finish urinating.  A digital rectal exam. In a rectal exam, your health care provider checks your prostate by putting a lubricated, gloved finger into your rectum to feel the back of your prostate gland. This exam detects the size of your gland and any abnormal lumps or growths.  An exam of your urine (urinalysis).  A prostate specific antigen (PSA) screening. This is a blood test used to screen for prostate cancer.  An ultrasound. This test uses sound waves to electronically produce a picture of your prostate gland. Your health care provider may refer you to a specialist in kidney and prostate diseases (urologist). How is this treated? Once symptoms begin, your health care provider will monitor your condition (active surveillance or watchful waiting). Treatment for this condition will depend on the severity of your condition. Treatment may include:  Observation and yearly exams. This may be the only treatment needed if your condition and symptoms are mild.  Medicines to relieve your symptoms, including: ? Medicines to shrink the  prostate. ? Medicines to relax the muscle of the prostate.  Surgery in severe cases. Surgery may include: ? Prostatectomy. In this procedure, the prostate tissue is removed completely through an open incision or with a laparoscope or robotics. ? Transurethral resection of the prostate (TURP). In this procedure, a tool is inserted through the opening at the tip of the penis (urethra). It is used to cut away tissue of the inner core of the prostate. The pieces are removed through the same opening of the penis. This removes the blockage. ? Transurethral incision (TUIP). In this procedure, small cuts are made in the prostate. This lessens the prostate's pressure on the urethra. ? Transurethral microwave thermotherapy (TUMT). This procedure uses microwaves to create heat. The heat destroys and removes a small amount of prostate tissue. ? Transurethral needle ablation (TUNA). This procedure uses radio frequencies to destroy and remove a small amount of prostate tissue. ? Interstitial laser coagulation ([place]). This procedure uses a laser to destroy and remove a small amount of prostate tissue. ? Transurethral electrovaporization (TUVP). This procedure uses  electrodes to destroy and remove a small amount of prostate tissue. ? Prostatic urethral lift. This procedure inserts an implant to push the lobes of the prostate away from the urethra. Follow these instructions at home:  Take over-the-counter and prescription medicines only as told by your health care provider.  Monitor your symptoms for any changes. Contact your health care provider with any changes.  Avoid drinking large amounts of liquid before going to bed or out in public.  Avoid or reduce how much caffeine or alcohol you drink.  Give yourself time when you urinate.  Keep all follow-up visits as told by your health care provider. This is important. Contact a health care provider if:  You have unexplained back pain.  Your symptoms do not  get better with treatment.  You develop side effects from the medicine you are taking.  Your urine becomes very dark or has a bad smell.  Your lower abdomen becomes distended and you have trouble passing your urine. Get help right away if:  You have a fever or chills.  You suddenly cannot urinate.  You feel lightheaded, or very dizzy, or you faint.  There are large amounts of blood or clots in the urine.  Your urinary problems become hard to manage.  You develop moderate to severe low back or flank pain. The flank is the side of your body between the ribs and the hip. These symptoms may represent a serious problem that is an emergency. Do not wait to see if the symptoms will go away. Get medical help right away. Call your local emergency services (911 in the [place] Do not drive yourself to the hospital. Summary  Benign prostatic hyperplasia (BPH) is an enlarged prostate that is caused by the normal aging process and not by cancer.  An enlarged prostate can press on the urethra. This can make it hard to pass urine.  This condition is part of a normal aging process and is more likely to develop in men over the age of 57 years.  Get help right away if you suddenly cannot urinate. This information is not intended to replace advice given to you by your health care provider. Make sure you discuss any questions you have with your health care provider. Document Released: 11/17/2005 Document Revised: 10/12/2018 Document Reviewed: 12/22/2016 Elsevier Patient Education  2020 [employer].
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
## 2019-07-28 LAB — CBC
HCT: 46.2 % (ref 38.5–50.0)
Hemoglobin: 16 g/dL (ref 13.2–17.1)
MCH: 31.7 pg (ref 27.0–33.0)
MCHC: 34.6 g/dL (ref 32.0–36.0)
MCV: 91.5 fL (ref 80.0–100.0)
MPV: 10.7 fL (ref 7.5–12.5)
Platelets: 358 10*3/uL (ref 140–400)
RBC: 5.05 10*6/uL (ref 4.20–5.80)
RDW: 12.6 % (ref 11.0–15.0)
WBC: 8.8 10*3/uL (ref 3.8–10.8)

## 2019-07-28 LAB — COMPREHENSIVE METABOLIC PANEL
AG Ratio: 1.1 (calc) (ref 1.0–2.5)
ALT: 7 U/L — ABNORMAL LOW (ref 9–46)
AST: 16 U/L (ref 10–35)
Albumin: 4 g/dL (ref 3.6–5.1)
Alkaline phosphatase (APISO): 79 U/L (ref 35–144)
BUN: 8 mg/dL (ref 7–25)
CO2: 25 mmol/L (ref 20–32)
Calcium: 9.9 mg/dL (ref 8.6–10.3)
Chloride: 104 mmol/L (ref 98–110)
Creat: 0.94 mg/dL (ref 0.70–1.33)
Globulin: 3.7 g/dL (calc) (ref 1.9–3.7)
Glucose, Bld: 90 mg/dL (ref 65–99)
Potassium: 4.2 mmol/L (ref 3.5–5.3)
Sodium: 138 mmol/L (ref 135–146)
Total Bilirubin: 0.9 mg/dL (ref 0.2–1.2)
Total Protein: 7.7 g/dL (ref 6.1–8.1)

## 2019-07-28 LAB — HEPATITIS C RNA QUANTITATIVE
HCV Quantitative Log: <1.18 Log IU/mL
HCV RNA, PCR, QN: <15 IU/mL

## 2019-08-22 ENCOUNTER — Other Ambulatory Visit: Payer: Self-pay | Admitting: Pharmacist

## 2019-08-22 DIAGNOSIS — B182 Chronic viral hepatitis C: Secondary | ICD-10-CM

## 2019-08-22 NOTE — Progress Notes (Signed)
Lab orders for Hepatitis C SVR12 visit in November

## 2019-08-26 ENCOUNTER — Encounter
Payer: BLUE CROSS/BLUE SHIELD | Attending: Physical Medicine & Rehabilitation | Admitting: Physical Medicine & Rehabilitation

## 2019-08-26 ENCOUNTER — Other Ambulatory Visit: Payer: Self-pay

## 2019-08-26 ENCOUNTER — Encounter: Payer: Self-pay | Admitting: Physical Medicine & Rehabilitation

## 2019-08-26 VITALS — BP 113/78 | HR 85 | Temp 98.1°F | Ht 68.0 in | Wt 159.2 lb

## 2019-08-26 DIAGNOSIS — Z79891 Long term (current) use of opiate analgesic: Secondary | ICD-10-CM | POA: Insufficient documentation

## 2019-08-26 DIAGNOSIS — M48061 Spinal stenosis, lumbar region without neurogenic claudication: Secondary | ICD-10-CM | POA: Diagnosis present

## 2019-08-26 DIAGNOSIS — G894 Chronic pain syndrome: Secondary | ICD-10-CM | POA: Diagnosis present

## 2019-08-26 DIAGNOSIS — Z5181 Encounter for therapeutic drug level monitoring: Secondary | ICD-10-CM | POA: Insufficient documentation

## 2019-08-26 MED ORDER — PREGABALIN 75 MG PO CAPS: 75.0000 mg | ORAL_CAPSULE | Freq: Two times a day (BID) | ORAL | 1 refills | Status: DC

## 2019-08-26 MED FILL — GABAPENTIN 300 MG CAPSULE: 300 | 30 days supply | Qty: 180 | Fill #1

## 2019-08-26 NOTE — Patient Instructions (Signed)
Please call if you decide to try an epidural steroid injection
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
## 2019-08-30 LAB — DRUG TOX MONITOR 1 W/CONF, ORAL FLD
Amphetamines: NEGATIVE ng/mL (ref <10)
Barbiturates: NEGATIVE ng/mL (ref <10)
Benzodiazepines: NEGATIVE ng/mL (ref <0.50)
Buprenorphine: NEGATIVE ng/mL (ref <0.10)
Cocaine: NEGATIVE ng/mL (ref <5.0)
Cotinine: 100.9 ng/mL — ABNORMAL HIGH (ref <5.0)
Fentanyl: NEGATIVE ng/mL (ref <0.10)
Heroin Metabolite: NEGATIVE ng/mL (ref <1.0)
MARIJUANA: NEGATIVE ng/mL (ref <2.5)
MDMA: NEGATIVE ng/mL (ref <10)
Meprobamate: NEGATIVE ng/mL (ref <2.5)
Methadone: NEGATIVE ng/mL (ref <5.0)
Nicotine Metabolite: POSITIVE ng/mL — AB (ref <5.0)
Opiates: NEGATIVE ng/mL (ref <2.5)
Phencyclidine: NEGATIVE ng/mL (ref <10)
Tapentadol: NEGATIVE ng/mL (ref <5.0)
Tramadol: NEGATIVE ng/mL (ref <5.0)
Zolpidem: NEGATIVE ng/mL (ref <5.0)

## 2019-08-30 LAB — DRUG TOX ALC METAB W/CON, ORAL FLD: Alcohol Metabolite: NEGATIVE ng/mL (ref <25)

## 2019-08-31 ENCOUNTER — Telehealth: Payer: Self-pay | Admitting: *Deleted

## 2019-08-31 NOTE — Telephone Encounter (Signed)
Oral swab drug screen was consistent for having no prescribed/unprescribed medications.

## 2019-09-07 ENCOUNTER — Encounter: Payer: Self-pay | Admitting: Family Medicine

## 2019-09-07 ENCOUNTER — Ambulatory Visit: Payer: BLUE CROSS/BLUE SHIELD | Attending: Family Medicine | Admitting: Family Medicine

## 2019-09-07 ENCOUNTER — Other Ambulatory Visit: Payer: Self-pay

## 2019-09-07 DIAGNOSIS — G8929 Other chronic pain: Secondary | ICD-10-CM

## 2019-09-07 DIAGNOSIS — R110 Nausea: Secondary | ICD-10-CM

## 2019-09-07 DIAGNOSIS — M5416 Radiculopathy, lumbar region: Secondary | ICD-10-CM

## 2019-09-07 DIAGNOSIS — F1011 Alcohol abuse, in remission: Secondary | ICD-10-CM

## 2019-09-07 DIAGNOSIS — M48062 Spinal stenosis, lumbar region with neurogenic claudication: Secondary | ICD-10-CM

## 2019-09-07 DIAGNOSIS — Z8719 Personal history of other diseases of the digestive system: Secondary | ICD-10-CM

## 2019-09-07 MED ORDER — TRAMADOL HCL 50 MG PO TABS: 50.0000 mg | ORAL_TABLET | Freq: Two times a day (BID) | ORAL | 4 refills | Status: AC | PRN

## 2019-09-07 MED ORDER — THIAMINE HCL 100 MG PO TABS: 100.0000 mg | ORAL_TABLET | Freq: Every day | ORAL | 6 refills | Status: DC

## 2019-09-07 MED ORDER — ONDANSETRON HCL 4 MG PO TABS: 4.0000 mg | ORAL_TABLET | Freq: Three times a day (TID) | ORAL | 3 refills | Status: DC | PRN

## 2019-09-07 MED ORDER — PANTOPRAZOLE SODIUM 40 MG PO TBEC: 40.0000 mg | DELAYED_RELEASE_TABLET | Freq: Every day | ORAL | 3 refills | Status: DC

## 2019-09-07 MED FILL — PANTOPRAZOLE SOD DR 40 MG T: 40 | 30 days supply | Qty: 30 | Fill #0

## 2019-09-07 MED FILL — ONDANSETRON HCL 4 MG TABLET: 4 | 6 days supply | Qty: 20 | Fill #0

## 2019-09-07 MED FILL — traMADol HCL 50 MG TABS: 50 | 30 days supply | Qty: 60 | Fill #0

## 2019-09-07 NOTE — Progress Notes (Signed)
Patient verified DOB Patient has taken medication today. Patient has not eaten today. Patient complains of pain in the back when he wakes up. Patient feels like his gait leans to the left for the past 3 weeks. Patient would like refills today. Patient could use LCSW referral for resources.
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
## 2019-09-14 ENCOUNTER — Other Ambulatory Visit: Payer: Self-pay | Admitting: Family Medicine

## 2019-09-14 DIAGNOSIS — M48062 Spinal stenosis, lumbar region with neurogenic claudication: Secondary | ICD-10-CM

## 2019-09-14 DIAGNOSIS — G609 Hereditary and idiopathic neuropathy, unspecified: Secondary | ICD-10-CM

## 2019-09-14 MED ORDER — PREGABALIN 75 MG PO CAPS: 75.0000 mg | ORAL_CAPSULE | Freq: Two times a day (BID) | ORAL | 1 refills | Status: DC

## 2019-09-14 NOTE — Progress Notes (Signed)
Patient ID: Wesley [name], male   DOB: [birth year], 53 y.o.   MRN: [id number]   Printed RX needed for Lyrica to go with patient's application for patient assistance program

## 2019-09-23 ENCOUNTER — Other Ambulatory Visit: Payer: Self-pay

## 2019-09-23 ENCOUNTER — Encounter: Payer: Self-pay | Attending: Physical Medicine & Rehabilitation | Admitting: Registered Nurse

## 2019-09-23 ENCOUNTER — Encounter: Payer: Self-pay | Admitting: Registered Nurse

## 2019-09-23 VITALS — BP 111/76 | HR 96 | Temp 98.7°F | Ht 68.0 in | Wt 161.0 lb

## 2019-09-23 DIAGNOSIS — Z5181 Encounter for therapeutic drug level monitoring: Secondary | ICD-10-CM | POA: Insufficient documentation

## 2019-09-23 DIAGNOSIS — G894 Chronic pain syndrome: Secondary | ICD-10-CM | POA: Insufficient documentation

## 2019-09-23 DIAGNOSIS — Z79891 Long term (current) use of opiate analgesic: Secondary | ICD-10-CM | POA: Insufficient documentation

## 2019-09-23 DIAGNOSIS — M48061 Spinal stenosis, lumbar region without neurogenic claudication: Secondary | ICD-10-CM | POA: Insufficient documentation

## 2019-09-23 DIAGNOSIS — G5793 Unspecified mononeuropathy of bilateral lower limbs: Secondary | ICD-10-CM
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
## 2019-10-01 ENCOUNTER — Other Ambulatory Visit: Payer: Self-pay | Admitting: Family Medicine

## 2019-10-01 DIAGNOSIS — M5416 Radiculopathy, lumbar region: Secondary | ICD-10-CM

## 2019-10-01 DIAGNOSIS — F1011 Alcohol abuse, in remission: Secondary | ICD-10-CM

## 2019-10-01 DIAGNOSIS — M48062 Spinal stenosis, lumbar region with neurogenic claudication: Secondary | ICD-10-CM

## 2019-10-01 NOTE — Progress Notes (Signed)
Patient ID: Wesley [name], male   DOB: 02-Dec-1965, 53 y.o.   MRN: [id number]   Patient is currently not employed and has issues with chronic pain-lumbar radiculopathy which prevent current employment.  Patient would like to speak with social worker regarding any resources that may be available.

## 2019-10-06 ENCOUNTER — Telehealth: Payer: Self-pay | Admitting: Licensed Clinical Social Worker

## 2019-10-06 NOTE — Telephone Encounter (Signed)
Call placed to patient regarding [facility] referral. LCSW left message requesting a return call.

## 2019-10-10 ENCOUNTER — Other Ambulatory Visit: Payer: Self-pay

## 2019-10-10 ENCOUNTER — Ambulatory Visit: Payer: Self-pay | Attending: Family Medicine | Admitting: Licensed Clinical Social Worker

## 2019-10-10 DIAGNOSIS — F439 Reaction to severe stress, unspecified: Secondary | ICD-10-CM

## 2019-10-10 DIAGNOSIS — Z599 Problem related to housing and economic circumstances, unspecified: Secondary | ICD-10-CM

## 2019-10-10 MED FILL — TAMSULOSIN HCL 0.4 MG CAP: 0.4 | 30 days supply | Qty: 30 | Fill #1

## 2019-10-10 NOTE — BH Specialist Note (Signed)
Integrated Behavioral Health Initial Visit  MRN: [id number] Name: Wesley [name]  Number of [place] Clinician visits:: 1/6 Session Start time: 10:15  Session End time: 10:50 Total time: 35   Type of Service: [place] Interpretor:No. Interpretor Name and Language: N/A   Warm Hand Off Completed.       SUBJECTIVE: Wesley [name] is a 53 y.o. male accompanied by self Patient was referred by Dr. Chapman Fitch for resource needs related to unemployment. Patient reports the following symptoms/concerns: Pt reports he is unable to work due to health conditions and is experiencing financial stress. Pt reports he is living off of savings but knows this is not sustainable.  Duration of problem: 2 years; Severity of problem: moderate  OBJECTIVE: Mood: Pleasant, Tired and Affect: Appropriate Risk of harm to self or others: No plan to harm self or others  LIFE CONTEXT: Family and Social: Pt lives in home with fiance. Fiance owns residence. Fiance receives disability. Pt has 3 grandchildren.  School/Work: Pt is unemployed. 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 Pt stopped working 2 years ago due to medical conditions/pain. Pt reports he has a 10th grade education. Pt is uninsured. Self-Care: Pt spends time with fiance in home. Pt reports he does not drink alcohol or use drugs. Per chart review, pt reports no alcohol use since 04/2019. Pt receives food stamps $16/month. Life Changes: Pt was recently denied Medicaid (he had too much money in savings to qualify). Pt is working with [place] attorney to apply for SSDI and SSI (he has been turned down for SSDI in past).   STRENGTHS: Pt's fiance pays for housing with disability income; pt has stable housing Pt is familiar with social services application process Pt has savings   GOALS ADDRESSED: Patient will: 1. Reduce symptoms of: stress 2. Increase knowledge and/or  ability of: stress reduction  3. Demonstrate ability to: Increase adequate support systems for patient/family  INTERVENTIONS: Interventions utilized: Supportive Counseling and Link to [employer]  Standardized Assessments completed: None completed  ASSESSMENT: Patient currently experiencing stress due to psychosocial stressors triggered by inability to work for 2 years due to health conditions. Pt reports pain in his entire body, stating that is even hurts to stretch. Pt reports that he is unable to perform physical labor which he has relied on for jobs his entire life and has 10th grade education. Pt believes he will not be hired for jobs that do not require physical activity due to education requirements and limited knowledge of technology. Pt states he is living on savings, but will run out of money soon. Pt reports he gets 3-4 hours of sleep/night due to worrying and physical pain. SW Intern discussed coping skills with pt to decrease stress.  Pt reports he is using [employer] legal service to apply for SSDI and appeal SSI. Pt reports he was recently denied Medicaid and SSI. Pt declined referral to Vocational Rehab and declined food resource.    Patient may benefit from applying for [employer]  and exploring insurance options with Affordable Care Act. Pt may also benefit from employment services such as vocational rehab, but is not currently interested. Pt encouraged to follow up with lawyer regarding SSDI and SSI. SW Intern explained [employer] partnership with Legal Aid and encouraged pt to contact for referral if needed.   PLAN: 1. Follow up with behavioral health clinician on : Pt encouraged to contact SW Intern or LCSW  if additional support is needed. 2. Behavioral recommendations: Apply for [employer]  and explore Affordable Care Act insurance plans during open enrollment. Pt encouraged to use coping skills discussed in session. 3. Referral(s):  Community Resources:  Production designer, theatre/television/film for [place]  4. "From scale of 1-10, how likely are you to follow plan?":   [place] Intern  10/10/2019 3:15pm

## 2019-10-13 NOTE — Addendum Note (Signed)
Addended by: Christa See D on: 10/13/2019 08:00 AM   Modules accepted: Level of Service

## 2019-10-20 ENCOUNTER — Encounter: Payer: Self-pay | Admitting: Family Medicine

## 2019-10-20 ENCOUNTER — Ambulatory Visit: Payer: Self-pay | Attending: Family Medicine | Admitting: Family Medicine

## 2019-10-20 ENCOUNTER — Other Ambulatory Visit: Payer: Self-pay

## 2019-10-20 VITALS — BP 113/69 | HR 82 | Temp 98.8°F | Ht 68.0 in | Wt 170.6 lb

## 2019-10-20 DIAGNOSIS — M48062 Spinal stenosis, lumbar region with neurogenic claudication: Secondary | ICD-10-CM

## 2019-10-20 DIAGNOSIS — G8929 Other chronic pain: Secondary | ICD-10-CM

## 2019-10-20 DIAGNOSIS — M5442 Lumbago with sciatica, left side: Secondary | ICD-10-CM

## 2019-10-20 DIAGNOSIS — M792 Neuralgia and neuritis, unspecified: Secondary | ICD-10-CM

## 2019-10-20 DIAGNOSIS — M5441 Lumbago with sciatica, right side: Secondary | ICD-10-CM

## 2019-10-20 MED ORDER — TIZANIDINE HCL 4 MG PO TABS: ORAL_TABLET | ORAL | 4 refills | Status: DC

## 2019-10-20 MED ORDER — PREGABALIN 150 MG PO CAPS: ORAL_CAPSULE | ORAL | 1 refills | Status: DC

## 2019-10-20 MED ORDER — FOLIC ACID 1 MG PO TABS: 1.0000 mg | ORAL_TABLET | Freq: Every day | ORAL | 3 refills | Status: DC

## 2019-10-20 MED ORDER — TRAMADOL HCL 50 MG PO TABS: 50.0000 mg | ORAL_TABLET | Freq: Three times a day (TID) | ORAL | 4 refills | Status: DC | PRN

## 2019-10-20 MED FILL — FOLIC ACID 1 MG TABS: 1 | 30 days supply | Qty: 30 | Fill #0

## 2019-10-20 MED FILL — traMADol HCL 50 MG TABS: 50 | 5 days supply | Qty: 15 | Fill #0

## 2019-10-20 MED FILL — tiZANidine HCL 4 MG TABS: 4 | 30 days supply | Qty: 60 | Fill #0

## 2019-10-20 NOTE — Progress Notes (Signed)
Pt. Stated he is having leg pain on both of his legs. Pt. Stated his pinky toe on his left foot is giving him pain.
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
## 2019-10-20 NOTE — Patient Instructions (Signed)
Neuropathic Pain Neuropathic pain is pain caused by damage to the nerves that are responsible for certain sensations in your body (sensory nerves). The pain can be caused by:  Damage to the sensory nerves that send signals to your spinal cord and brain (peripheral nervous system).  Damage to the sensory nerves in your brain or spinal cord (central nervous system). Neuropathic pain can make you more sensitive to pain. Even a minor sensation can feel very painful. This is usually a long-term condition that can be difficult to treat. The type of pain differs from person to person. It may:  Start suddenly (acute), or it may develop slowly and last for a long time (chronic).  Come and go as damaged nerves heal, or it may stay at the same level for years.  Cause emotional distress, loss of sleep, and a lower quality of life. What are the causes? The most common cause of this condition is diabetes. Many other diseases and conditions can also cause neuropathic pain. Causes of neuropathic pain can be classified as:  Toxic. This is caused by medicines and chemicals. The most common cause of toxic neuropathic pain is damage from cancer treatments (chemotherapy).  Metabolic. This can be caused by: ? Diabetes. This is the most common disease that damages the nerves. ? Lack of vitamin B from long-term alcohol abuse.  Traumatic. Any injury that cuts, crushes, or stretches a nerve can cause damage and pain. A common example is feeling pain after losing an arm or leg (phantom limb pain).  Compression-related. If a sensory nerve gets trapped or compressed for a long period of time, the blood supply to the nerve can be cut off.  Vascular. Many blood vessel diseases can cause neuropathic pain by decreasing blood supply and oxygen to nerves.  Autoimmune. This type of pain results from diseases in which the body's defense system (immune system) mistakenly attacks sensory nerves. Examples of autoimmune diseases  that can cause neuropathic pain include lupus and multiple sclerosis.  Infectious. Many types of viral infections can damage sensory nerves and cause pain. Shingles infection is a common cause of this type of pain.  Inherited. Neuropathic pain can be a symptom of many diseases that are passed down through families (genetic). What increases the risk? You are more likely to develop this condition if:  You have diabetes.  You smoke.  You drink too much alcohol.  You are taking certain medicines, including medicines that kill cancer cells (chemotherapy) or that treat immune system disorders. What are the signs or symptoms? The main symptom is pain. Neuropathic pain is often described as:  Burning.  Shock-like.  Stinging.  Hot or cold.  Itching. How is this diagnosed? No single test can diagnose neuropathic pain. It is diagnosed based on:  Physical exam and your symptoms. Your health care provider will ask you about your pain. You may be asked to use a pain scale to describe how bad your pain is.  Tests. These may be done to see if you have a high sensitivity to pain and to help find the cause and location of any sensory nerve damage. They include: ? Nerve conduction studies to test how well nerve signals travel through your sensory nerves (electrodiagnostic testing). ? Stimulating your sensory nerves through electrodes on your skin and measuring the response in your spinal cord and brain (somatosensory evoked potential).  Imaging studies, such as: ? X-rays. ? CT scan. ? MRI. How is this treated? Treatment for neuropathic pain may change  over time. You may need to try different treatment options or a combination of treatments. Some options include:  Treating the underlying cause of the neuropathy, such as diabetes, kidney disease, or vitamin deficiencies.  Stopping medicines that can cause neuropathy, such as chemotherapy.  Medicine to relieve pain. Medicines may  include: ? Prescription or over-the-counter pain medicine. ? Anti-seizure medicine. ? Antidepressant medicines. ? Pain-relieving patches that are applied to painful areas of skin. ? A medicine to numb the area (local anesthetic), which can be injected as a nerve block.  Transcutaneous nerve stimulation. This uses electrical currents to block painful nerve signals. The treatment is painless.  Alternative treatments, such as: ? Acupuncture. ? Meditation. ? Massage. ? Physical therapy. ? Pain management programs. ? Counseling. Follow these instructions at home: Medicines   Take over-the-counter and prescription medicines only as told by your health care provider.  Do not drive or use heavy machinery while taking prescription pain medicine.  If you are taking prescription pain medicine, take actions to prevent or treat constipation. Your health care provider may recommend that you: ? Drink enough fluid to keep your urine pale yellow. ? Eat foods that are high in fiber, such as fresh fruits and vegetables, whole grains, and beans. ? Limit foods that are high in fat and processed sugars, such as fried or sweet foods. ? Take an over-the-counter or prescription medicine for constipation. Lifestyle   Have a good support system at home.  Consider joining a chronic pain support group.  Do not use any products that contain nicotine or tobacco, such as cigarettes and e-cigarettes. If you need help quitting, ask your health care provider.  Do not drink alcohol. General instructions  Learn as much as you can about your condition.  Work closely with all your health care providers to find the treatment plan that works best for you.  Ask your health care provider what activities are safe for you.  Keep all follow-up visits as told by your health care provider. This is important. Contact a health care provider if:  Your pain treatments are not working.  You are having side effects  from your medicines.  You are struggling with tiredness (fatigue), mood changes, depression, or anxiety. Summary  Neuropathic pain is pain caused by damage to the nerves that are responsible for certain sensations in your body (sensory nerves).  Neuropathic pain may come and go as damaged nerves heal, or it may stay at the same level for years.  Neuropathic pain is usually a long-term condition that can be difficult to treat. Consider joining a chronic pain support group. This information is not intended to replace advice given to you by your health care provider. Make sure you discuss any questions you have with your health care provider. Document Released: 08/14/2004 Document Revised: 03/10/2019 Document Reviewed: 12/04/2017 Elsevier Patient Education  2020 [employer].

## 2019-10-22 MED ORDER — TRAMADOL HCL 50 MG PO TABS: 50.0000 mg | ORAL_TABLET | Freq: Three times a day (TID) | ORAL | 4 refills | Status: DC | PRN

## 2019-10-31 ENCOUNTER — Other Ambulatory Visit: Payer: Self-pay

## 2019-10-31 ENCOUNTER — Ambulatory Visit: Payer: Self-pay | Admitting: Pharmacist

## 2019-10-31 DIAGNOSIS — B182 Chronic viral hepatitis C: Secondary | ICD-10-CM

## 2019-11-03 MED FILL — TAMSULOSIN HCL 0.4 MG CAP: 0.4 | 30 days supply | Qty: 30 | Fill #2

## 2019-11-07 LAB — HEPATITIS C RNA QUANTITATIVE
HCV Quantitative Log: <1.18 Log IU/mL
HCV RNA, PCR, QN: <15 IU/mL

## 2019-11-15 ENCOUNTER — Other Ambulatory Visit: Payer: Self-pay | Admitting: Family Medicine

## 2019-11-15 DIAGNOSIS — E538 Deficiency of other specified B group vitamins: Secondary | ICD-10-CM

## 2020-01-20 ENCOUNTER — Ambulatory Visit: Payer: Self-pay | Admitting: Family Medicine

## 2020-01-23 ENCOUNTER — Ambulatory Visit: Payer: Self-pay | Admitting: Family Medicine

## 2020-02-03 ENCOUNTER — Encounter: Payer: Self-pay | Admitting: Family Medicine

## 2020-02-03 ENCOUNTER — Other Ambulatory Visit: Payer: Self-pay

## 2020-02-03 ENCOUNTER — Ambulatory Visit: Payer: Self-pay | Attending: Family Medicine | Admitting: Family Medicine

## 2020-02-03 VITALS — BP 113/73 | HR 81 | Temp 98.2°F | Resp 18 | Ht 68.0 in | Wt 168.0 lb

## 2020-02-03 DIAGNOSIS — M48062 Spinal stenosis, lumbar region with neurogenic claudication: Secondary | ICD-10-CM

## 2020-02-03 DIAGNOSIS — M5416 Radiculopathy, lumbar region: Secondary | ICD-10-CM

## 2020-02-03 DIAGNOSIS — M792 Neuralgia and neuritis, unspecified: Secondary | ICD-10-CM

## 2020-02-03 MED ORDER — OXYCODONE HCL 5 MG PO TABS: ORAL_TABLET | ORAL | 0 refills | Status: DC
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
## 2020-02-03 NOTE — Patient Instructions (Signed)
Neuropathic Pain Neuropathic pain is pain caused by damage to the nerves that are responsible for certain sensations in your body (sensory nerves). The pain can be caused by:  Damage to the sensory nerves that send signals to your spinal cord and brain (peripheral nervous system).  Damage to the sensory nerves in your brain or spinal cord (central nervous system). Neuropathic pain can make you more sensitive to pain. Even a minor sensation can feel very painful. This is usually a long-term condition that can be difficult to treat. The type of pain differs from person to person. It may:  Start suddenly (acute), or it may develop slowly and last for a long time (chronic).  Come and go as damaged nerves heal, or it may stay at the same level for years.  Cause emotional distress, loss of sleep, and a lower quality of life. What are the causes? The most common cause of this condition is diabetes. Many other diseases and conditions can also cause neuropathic pain. Causes of neuropathic pain can be classified as:  Toxic. This is caused by medicines and chemicals. The most common cause of toxic neuropathic pain is damage from cancer treatments (chemotherapy).  Metabolic. This can be caused by: ? Diabetes. This is the most common disease that damages the nerves. ? Lack of vitamin B from long-term alcohol abuse.  Traumatic. Any injury that cuts, crushes, or stretches a nerve can cause damage and pain. A common example is feeling pain after losing an arm or leg (phantom limb pain).  Compression-related. If a sensory nerve gets trapped or compressed for a long period of time, the blood supply to the nerve can be cut off.  Vascular. Many blood vessel diseases can cause neuropathic pain by decreasing blood supply and oxygen to nerves.  Autoimmune. This type of pain results from diseases in which the body's defense system (immune system) mistakenly attacks sensory nerves. Examples of autoimmune diseases  that can cause neuropathic pain include lupus and multiple sclerosis.  Infectious. Many types of viral infections can damage sensory nerves and cause pain. Shingles infection is a common cause of this type of pain.  Inherited. Neuropathic pain can be a symptom of many diseases that are passed down through families (genetic). What increases the risk? You are more likely to develop this condition if:  You have diabetes.  You smoke.  You drink too much alcohol.  You are taking certain medicines, including medicines that kill cancer cells (chemotherapy) or that treat immune system disorders. What are the signs or symptoms? The main symptom is pain. Neuropathic pain is often described as:  Burning.  Shock-like.  Stinging.  Hot or cold.  Itching. How is this diagnosed? No single test can diagnose neuropathic pain. It is diagnosed based on:  Physical exam and your symptoms. Your health care provider will ask you about your pain. You may be asked to use a pain scale to describe how bad your pain is.  Tests. These may be done to see if you have a high sensitivity to pain and to help find the cause and location of any sensory nerve damage. They include: ? Nerve conduction studies to test how well nerve signals travel through your sensory nerves (electrodiagnostic testing). ? Stimulating your sensory nerves through electrodes on your skin and measuring the response in your spinal cord and brain (somatosensory evoked potential).  Imaging studies, such as: ? X-rays. ? CT scan. ? MRI. How is this treated? Treatment for neuropathic pain may change  over time. You may need to try different treatment options or a combination of treatments. Some options include:  Treating the underlying cause of the neuropathy, such as diabetes, kidney disease, or vitamin deficiencies.  Stopping medicines that can cause neuropathy, such as chemotherapy.  Medicine to relieve pain. Medicines may  include: ? Prescription or over-the-counter pain medicine. ? Anti-seizure medicine. ? Antidepressant medicines. ? Pain-relieving patches that are applied to painful areas of skin. ? A medicine to numb the area (local anesthetic), which can be injected as a nerve block.  Transcutaneous nerve stimulation. This uses electrical currents to block painful nerve signals. The treatment is painless.  Alternative treatments, such as: ? Acupuncture. ? Meditation. ? Massage. ? Physical therapy. ? Pain management programs. ? Counseling. Follow these instructions at home: Medicines   Take over-the-counter and prescription medicines only as told by your health care provider.  Do not drive or use heavy machinery while taking prescription pain medicine.  If you are taking prescription pain medicine, take actions to prevent or treat constipation. Your health care provider may recommend that you: ? Drink enough fluid to keep your urine pale yellow. ? Eat foods that are high in fiber, such as fresh fruits and vegetables, whole grains, and beans. ? Limit foods that are high in fat and processed sugars, such as fried or sweet foods. ? Take an over-the-counter or prescription medicine for constipation. Lifestyle   Have a good support system at home.  Consider joining a chronic pain support group.  Do not use any products that contain nicotine or tobacco, such as cigarettes and e-cigarettes. If you need help quitting, ask your health care provider.  Do not drink alcohol. General instructions  Learn as much as you can about your condition.  Work closely with all your health care providers to find the treatment plan that works best for you.  Ask your health care provider what activities are safe for you.  Keep all follow-up visits as told by your health care provider. This is important. Contact a health care provider if:  Your pain treatments are not working.  You are having side effects  from your medicines.  You are struggling with tiredness (fatigue), mood changes, depression, or anxiety. Summary  Neuropathic pain is pain caused by damage to the nerves that are responsible for certain sensations in your body (sensory nerves).  Neuropathic pain may come and go as damaged nerves heal, or it may stay at the same level for years.  Neuropathic pain is usually a long-term condition that can be difficult to treat. Consider joining a chronic pain support group. This information is not intended to replace advice given to you by your health care provider. Make sure you discuss any questions you have with your health care provider. Document Revised: 03/10/2019 Document Reviewed: 12/04/2017 Elsevier Patient Education  Wesley [name].

## 2020-03-19 ENCOUNTER — Emergency Department (HOSPITAL_COMMUNITY)
Admission: EM | Admit: 2020-03-19 | Discharge: 2020-03-19 | Disposition: A | Discharge status: Home / Self Care | Payer: Self-pay | Attending: Emergency Medicine | Admitting: Emergency Medicine

## 2020-03-19 ENCOUNTER — Other Ambulatory Visit: Payer: Self-pay

## 2020-03-19 ENCOUNTER — Encounter (HOSPITAL_COMMUNITY): Payer: Self-pay | Admitting: Emergency Medicine

## 2020-03-19 DIAGNOSIS — Z79899 Other long term (current) drug therapy: Secondary | ICD-10-CM | POA: Insufficient documentation

## 2020-03-19 DIAGNOSIS — F17210 Nicotine dependence, cigarettes, uncomplicated: Secondary | ICD-10-CM | POA: Insufficient documentation

## 2020-03-19 DIAGNOSIS — J449 Chronic obstructive pulmonary disease, unspecified: Secondary | ICD-10-CM | POA: Insufficient documentation

## 2020-03-19 DIAGNOSIS — L259 Unspecified contact dermatitis, unspecified cause: Secondary | ICD-10-CM | POA: Insufficient documentation

## 2020-03-19 MED ORDER — HYDROCORTISONE 1 % EX CREA: TOPICAL_CREAM | CUTANEOUS | 0 refills | Status: DC
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
## 2020-03-19 NOTE — Discharge Instructions (Signed)
Please apply the hydrocortisone cream to the affected areas twice daily.  Please take an oral antihistamine such as Zyrtec on a daily basis.  Oral antihistamines can be obtained over-the-counter at your pharmacy.  Please follow-up with your primary care provider, Dr. Chapman Fitch, for ongoing evaluation and management.  If your symptoms have not improved with conservative management, you may ultimately require referral to rheumatology or dermatology for ongoing evaluation and management.  Low suspicion for bacterial infection at this time and antibiotics are not warranted.   Please return to the ED or seek immediate medical attention should you experience any new or worsening symptoms.

## 2020-03-19 NOTE — ED Triage Notes (Signed)
Pt in with itchy rash, small bumps present to bilateral forearms, no drainage present. Ongoing x 2 days, itching x 36hrs. States he was outside working, in contact with some briars a few days ago, is so irritated by the itching he cannot sleep. Calamine lotion not working. States bilateral fingers feel numb and tight

## 2020-03-20 ENCOUNTER — Ambulatory Visit: Payer: Self-pay | Attending: Internal Medicine | Admitting: Internal Medicine

## 2020-03-20 ENCOUNTER — Other Ambulatory Visit: Payer: Self-pay

## 2020-03-20 ENCOUNTER — Encounter: Payer: Self-pay | Admitting: Internal Medicine

## 2020-03-20 DIAGNOSIS — L309 Dermatitis, unspecified: Secondary | ICD-10-CM

## 2020-03-20 DIAGNOSIS — M48062 Spinal stenosis, lumbar region with neurogenic claudication: Secondary | ICD-10-CM

## 2020-03-20 DIAGNOSIS — R29898 Other symptoms and signs involving the musculoskeletal system: Secondary | ICD-10-CM

## 2020-03-20 NOTE — Progress Notes (Signed)
Virtual Visit via Telephone Note  I connected with Wesley [name] on 03/20/20 at  2:30 PM EDT by telephone and verified that I am speaking with the correct person using two identifiers.  Location: Patient: home Provider: home   I discussed the limitations, risks, security and privacy concerns of performing an evaluation and management service by telephone and the availability of in person appointments. I also discussed with the patient that there may be a patient responsible charge related to this service. The patient expressed understanding and agreed to proceed.   History of Present Illness: Rash x 1 week, eval in ED 1 d ago. Sx started after doing yard work, he was concerned about poison ivy or poison oak. He reports the swelling has improved, HC improved with pruritis. Not spreading.   Reports weakness in hand was noted in ED, ?due to prior surgery,  hx spinal stenosis, was advised to schedule appt with surg few mos ago, but they did not contact him. Observations/Objective:   Assessment and Plan: 1. Dermatitis Improving with HC, advised to continue, monitor sx, call if sx worsen  2. Neurogenic claudication due to lumbar spinal stenosis  Advised pt to call to schedule appt with surg as advised  3. Hand weakness Advised to schedule [id number] for further evaluation  Follow Up Instructions:    I discussed the assessment and treatment plan with the patient. The patient was provided an opportunity to ask questions and all were answered. The patient agreed with the plan and demonstrated an understanding of the instructions.   The patient was advised to call back or seek an in-person evaluation if the symptoms worsen or if the condition fails to improve as anticipated.  I provided 15 minutes of non-face-to-face time during this encounter.   LKimber Relic MD

## 2020-03-26 MED FILL — traMADol HCL 50 MG TABS: 50 | 5 days supply | Qty: 15 | Fill #1

## 2020-03-27 ENCOUNTER — Encounter: Payer: Self-pay | Attending: Physical Medicine & Rehabilitation | Admitting: Physical Medicine & Rehabilitation

## 2020-04-24 ENCOUNTER — Other Ambulatory Visit: Payer: Self-pay | Admitting: Family Medicine

## 2020-04-24 ENCOUNTER — Other Ambulatory Visit: Payer: Self-pay | Admitting: *Deleted

## 2020-04-24 DIAGNOSIS — M48062 Spinal stenosis, lumbar region with neurogenic claudication: Secondary | ICD-10-CM

## 2020-04-24 DIAGNOSIS — M5416 Radiculopathy, lumbar region: Secondary | ICD-10-CM

## 2020-04-24 DIAGNOSIS — M792 Neuralgia and neuritis, unspecified: Secondary | ICD-10-CM

## 2020-04-24 MED ORDER — PREGABALIN 150 MG PO CAPS: ORAL_CAPSULE | ORAL | 1 refills | Status: DC

## 2020-04-24 MED ORDER — TRAMADOL HCL 50 MG PO TABS: 50.0000 mg | ORAL_TABLET | Freq: Four times a day (QID) | ORAL | 0 refills | Status: DC | PRN

## 2020-04-24 MED ORDER — OXYCODONE HCL 5 MG PO TABS: ORAL_TABLET | ORAL | 0 refills | Status: DC

## 2020-04-24 MED ORDER — FOLIC ACID 1 MG PO TABS: 1.0000 mg | ORAL_TABLET | Freq: Every day | ORAL | 3 refills | Status: DC

## 2020-04-25 ENCOUNTER — Other Ambulatory Visit: Payer: Self-pay | Admitting: Family Medicine

## 2020-04-25 ENCOUNTER — Telehealth: Payer: Self-pay | Admitting: Family Medicine

## 2020-04-25 DIAGNOSIS — M48062 Spinal stenosis, lumbar region with neurogenic claudication: Secondary | ICD-10-CM

## 2020-04-25 DIAGNOSIS — M792 Neuralgia and neuritis, unspecified: Secondary | ICD-10-CM

## 2020-04-25 MED ORDER — PREGABALIN 150 MG PO CAPS: 150.0000 mg | ORAL_CAPSULE | Freq: Two times a day (BID) | ORAL | 1 refills | Status: DC

## 2020-04-25 MED FILL — FOLIC ACID 1 MG TABS: 1 | 90 days supply | Qty: 90 | Fill #0

## 2020-04-25 NOTE — Progress Notes (Signed)
Patient ID: Wesley [name], male   DOB: 20-Jan-1966, 54 y.o.   MRN: [id number]   Message received from pharmacy that patient's RX for Lyrica needs to be sent to [facility] in [place], [place] to be filled through the [facility] program

## 2020-05-02 ENCOUNTER — Ambulatory Visit: Payer: Self-pay | Attending: Family Medicine | Admitting: Physician Assistant

## 2020-05-02 ENCOUNTER — Other Ambulatory Visit: Payer: Self-pay

## 2020-05-02 VITALS — BP 114/76 | HR 66 | Ht 68.0 in | Wt 160.0 lb

## 2020-05-02 DIAGNOSIS — G2581 Restless legs syndrome: Secondary | ICD-10-CM

## 2020-05-02 DIAGNOSIS — M5416 Radiculopathy, lumbar region: Secondary | ICD-10-CM

## 2020-05-02 DIAGNOSIS — G629 Polyneuropathy, unspecified: Secondary | ICD-10-CM

## 2020-05-02 MED ORDER — ROPINIROLE HCL 1 MG PO TABS: 1.0000 mg | ORAL_TABLET | Freq: Every day | ORAL | 2 refills | Status: DC

## 2020-05-02 MED FILL — rOPINIRole HCL 1 MG TABS: 1 | 30 days supply | Qty: 30 | Fill #0

## 2020-05-02 NOTE — Patient Instructions (Addendum)
Avoid alcohol   Restless Legs Syndrome Restless legs syndrome is a condition that causes uncomfortable feelings or sensations in the legs, especially while sitting or lying down. The sensations usually cause an overwhelming urge to move the legs. The arms can also sometimes be affected. The condition can range from mild to severe. The symptoms often interfere with a person's ability to sleep. What are the causes? The cause of this condition is not known. What increases the risk? The following factors may make you more likely to develop this condition:  Being older than 50.  Pregnancy.  Being a woman. In general, the condition is more common in women than in men.  A family history of the condition.  Having iron deficiency.  Overuse of caffeine, nicotine, or alcohol.  Certain medical conditions, such as kidney disease, Parkinson's disease, or nerve damage.  Certain medicines, such as those for high blood pressure, nausea, colds, allergies, depression, and some heart conditions. What are the signs or symptoms? The main symptom of this condition is uncomfortable sensations in the legs, such as:  Pulling.  Tingling.  Prickling.  Throbbing.  Crawling.  Burning. Usually, the sensations:  Affect both sides of the body.  Are worse when you sit or lie down.  Are worse at night. These may wake you up or make it difficult to fall asleep.  Make you have a strong urge to move your legs.  Are temporarily relieved by moving your legs. The arms can also be affected, but this is rare. People who have this condition often have tiredness during the [place] because of their lack of sleep at night. How is this diagnosed? This condition may be diagnosed based on:  Your symptoms.  Blood tests. In some cases, you may be monitored in a sleep lab by a specialist (a sleep study). This can detect any disruptions in your sleep. How is this treated? This condition is treated by managing the  symptoms. This may include:  Lifestyle changes, such as exercising, using relaxation techniques, and avoiding caffeine, alcohol, or tobacco.  Medicines. Anti-seizure medicines may be tried first. Follow these instructions at home:     General instructions  Take over-the-counter and prescription medicines only as told by your health care provider.  Use methods to help relieve the uncomfortable sensations, such as: ? Massaging your legs. ? Walking or stretching. ? Taking a cold or hot bath.  Keep all follow-up visits as told by your health care provider. This is important. Lifestyle  Practice good sleep habits. For example, go to bed and get up at the same time every [place]. Most adults should get 7-9 hours of sleep each night.  Exercise regularly. Try to get at least 30 minutes of exercise most days of the week.  Practice ways of relaxing, such as yoga or meditation.  Avoid caffeine and alcohol.  Do not use any products that contain nicotine or tobacco, such as cigarettes and e-cigarettes. If you need help quitting, ask your health care provider. Contact a health care provider if:  Your symptoms get worse or they do not improve with treatment. Summary  Restless legs syndrome is a condition that causes uncomfortable feelings or sensations in the legs, especially while sitting or lying down.  The symptoms often interfere with a person's ability to sleep.  This condition is treated by managing the symptoms. You may need to make lifestyle changes or take medicines. This information is not intended to replace advice given to you by your health  care provider. Make sure you discuss any questions you have with your health care provider. Document Revised: 12/07/2017 Document Reviewed: 12/07/2017 Elsevier Patient Education  2020 [employer].   Neuropathic Pain Neuropathic pain is pain caused by damage to the nerves that are responsible for certain sensations in your body (sensory  nerves). The pain can be caused by:  Damage to the sensory nerves that send signals to your spinal cord and brain (peripheral nervous system).  Damage to the sensory nerves in your brain or spinal cord (central nervous system). Neuropathic pain can make you more sensitive to pain. Even a minor sensation can feel very painful. This is usually a long-term condition that can be difficult to treat. The type of pain differs from person to person. It may:  Start suddenly (acute), or it may develop slowly and last for a long time (chronic).  Come and go as damaged nerves heal, or it may stay at the same level for years.  Cause emotional distress, loss of sleep, and a lower quality of life. What are the causes? The most common cause of this condition is diabetes. Many other diseases and conditions can also cause neuropathic pain. Causes of neuropathic pain can be classified as:  Toxic. This is caused by medicines and chemicals. The most common cause of toxic neuropathic pain is damage from cancer treatments (chemotherapy).  Metabolic. This can be caused by: ? Diabetes. This is the most common disease that damages the nerves. ? Lack of vitamin B from long-term alcohol abuse.  Traumatic. Any injury that cuts, crushes, or stretches a nerve can cause damage and pain. A common example is feeling pain after losing an arm or leg (phantom limb pain).  Compression-related. If a sensory nerve gets trapped or compressed for a long period of time, the blood supply to the nerve can be cut off.  Vascular. Many blood vessel diseases can cause neuropathic pain by decreasing blood supply and oxygen to nerves.  Autoimmune. This type of pain results from diseases in which the body's defense system (immune system) mistakenly attacks sensory nerves. Examples of autoimmune diseases that can cause neuropathic pain include lupus and multiple sclerosis.  Infectious. Many types of viral infections can damage sensory  nerves and cause pain. Shingles infection is a common cause of this type of pain.  Inherited. Neuropathic pain can be a symptom of many diseases that are passed down through families (genetic). What increases the risk? You are more likely to develop this condition if:  You have diabetes.  You smoke.  You drink too much alcohol.  You are taking certain medicines, including medicines that kill cancer cells (chemotherapy) or that treat immune system disorders. What are the signs or symptoms? The main symptom is pain. Neuropathic pain is often described as:  Burning.  Shock-like.  Stinging.  Hot or cold.  Itching. How is this diagnosed? No single test can diagnose neuropathic pain. It is diagnosed based on:  Physical exam and your symptoms. Your health care provider will ask you about your pain. You may be asked to use a pain scale to describe how bad your pain is.  Tests. These may be done to see if you have a high sensitivity to pain and to help find the cause and location of any sensory nerve damage. They include: ? Nerve conduction studies to test how well nerve signals travel through your sensory nerves (electrodiagnostic testing). ? Stimulating your sensory nerves through electrodes on your skin and measuring the response  in your spinal cord and brain (somatosensory evoked potential).  Imaging studies, such as: ? X-rays. ? CT scan. ? MRI. How is this treated? Treatment for neuropathic pain may change over time. You may need to try different treatment options or a combination of treatments. Some options include:  Treating the underlying cause of the neuropathy, such as diabetes, kidney disease, or vitamin deficiencies.  Stopping medicines that can cause neuropathy, such as chemotherapy.  Medicine to relieve pain. Medicines may include: ? Prescription or over-the-counter pain medicine. ? Anti-seizure medicine. ? Antidepressant medicines. ? Pain-relieving patches that  are applied to painful areas of skin. ? A medicine to numb the area (local anesthetic), which can be injected as a nerve block.  Transcutaneous nerve stimulation. This uses electrical currents to block painful nerve signals. The treatment is painless.  Alternative treatments, such as: ? Acupuncture. ? Meditation. ? Massage. ? Physical therapy. ? Pain management programs. ? Counseling. Follow these instructions at home: Medicines   Take over-the-counter and prescription medicines only as told by your health care provider.  Do not drive or use heavy machinery while taking prescription pain medicine.  If you are taking prescription pain medicine, take actions to prevent or treat constipation. Your health care provider may recommend that you: ? Drink enough fluid to keep your urine pale yellow. ? Eat foods that are high in fiber, such as fresh fruits and vegetables, whole grains, and beans. ? Limit foods that are high in fat and processed sugars, such as fried or sweet foods. ? Take an over-the-counter or prescription medicine for constipation. Lifestyle   Have a good support system at home.  Consider joining a chronic pain support group.  Do not use any products that contain nicotine or tobacco, such as cigarettes and e-cigarettes. If you need help quitting, ask your health care provider.  Do not drink alcohol. General instructions  Learn as much as you can about your condition.  Work closely with all your health care providers to find the treatment plan that works best for you.  Ask your health care provider what activities are safe for you.  Keep all follow-up visits as told by your health care provider. This is important. Contact a health care provider if:  Your pain treatments are not working.  You are having side effects from your medicines.  You are struggling with tiredness (fatigue), mood changes, depression, or anxiety. Summary  Neuropathic pain is pain  caused by damage to the nerves that are responsible for certain sensations in your body (sensory nerves).  Neuropathic pain may come and go as damaged nerves heal, or it may stay at the same level for years.  Neuropathic pain is usually a long-term condition that can be difficult to treat. Consider joining a chronic pain support group. This information is not intended to replace advice given to you by your health care provider. Make sure you discuss any questions you have with your health care provider. Document Revised: 03/10/2019 Document Reviewed: 12/04/2017 Elsevier Patient Education  [employer].

## 2020-05-02 NOTE — Progress Notes (Signed)
Patient ID: Wesley [name], male   DOB: 04/25/1966, 54 y.o.   MRN: [id number]   Wesley [name], is a 54 y.o. male  OIN:[id number]  BSJ:[id number]  DOB - [birth year]  Subjective:  Chief Complaint and HPI: Wesley [name] is a 54 y.o. male here today with continued B leg pain and back pain.  Has not seen neurosurgery.  Still needs to get OC or financial assistance.  Abnormal MRI last summer.  lyrica doesn't help any more than gabapentin.  Also says legs "jump" at bedtime.     ROS:   Constitutional:  No f/c, No night sweats, No unexplained weight loss. EENT:  No vision changes, No blurry vision, No hearing changes. No mouth, throat, or ear problems.  Respiratory: No cough, No SOB Cardiac: No CP, no palpitations GI:  No abd pain, No N/V/D. GU: No Urinary s/sx Musculoskeletal: back and leg pain Neuro: No headache, no dizziness, no motor weakness.  Skin: No rash Endocrine:  No polydipsia. No polyuria.  Psych: Denies SI/HI  No problems updated.  ALLERGIES: No Known Allergies  PAST MEDICAL HISTORY: Past Medical History:  Diagnosis Date  . Acute pancreatitis   . Alcohol abuse   . Cholecystitis 01/2018  . Chronic hepatitis C ([place])   . History of blood transfusion   . Neuropathy   . Radial nerve compression    right    MEDICATIONS AT HOME: Prior to Admission medications   Medication Sig Start Date End Date Taking? Authorizing Provider  folic acid (FOLVITE) 1 MG tablet Take 1 tablet (1 mg total) by mouth daily. 04/24/20  Yes Fulp, Cammie, MD  naproxen (NAPROSYN) 500 MG tablet Take 500 mg by mouth 2 (two) times daily as needed for mild pain.   Yes [provider]  pregabalin (LYRICA) 150 MG capsule Take 1 capsule (150 mg total) by mouth 2 (two) times daily. For nerve pain 04/25/20  Yes Fulp, Cammie, MD  tiZANidine (ZANAFLEX) 4 MG tablet One or two pills at bedtime as needed for muscle spasm 10/20/19  Yes Fulp, Cammie, MD  traMADol (ULTRAM) 50 MG tablet Take 1 tablet (50 mg total) by  mouth every 6 (six) hours as needed. 04/24/20  Yes Fulp, Cammie, MD  gabapentin (NEURONTIN) 300 MG capsule TAKE 2 CAPSULE IN THE MORNING, 2 CAP IN THE LATE AFTERNOON, AND 2 CAPS AT BEDTIME Patient not taking: Reported on 10/20/2019 07/27/19   Fulp, Ander Gaster, MD  hydrocortisone cream 1 % Apply to affected area 2 times daily Patient not taking: Reported on 05/02/2020 03/19/20   Corena Herter, PA-C  Multiple Vitamin (MULTIVITAMIN) tablet Take 1 tablet by mouth daily.    [provider]  Multiple Vitamins-Minerals (ZINC PO) Take by mouth daily.    [provider]  ondansetron (ZOFRAN) 4 MG tablet Take 1 tablet (4 mg total) by mouth every 8 (eight) hours as needed for nausea or vomiting. Patient not taking: Reported on 05/02/2020 09/07/19   Antony Blackbird, MD  oxyCODONE (OXY IR/ROXICODONE) 5 MG immediate release tablet One pill twice per [place] as needed for severe pain Patient not taking: Reported on 05/02/2020 04/24/20   Fulp, Cammie, MD  pantoprazole (PROTONIX) 40 MG tablet Take 1 tablet (40 mg total) by mouth daily. Patient not taking: Reported on 05/02/2020 09/07/19   Fulp, Ander Gaster, MD  rOPINIRole (REQUIP) 1 MG tablet Take 1 tablet (1 mg total) by mouth at bedtime. 05/02/20   Argentina Donovan, PA-C  tamsulosin (FLOMAX) 0.4 MG CAPS capsule Take 1 capsule (  0.4 mg total) by mouth daily. Patient not taking: Reported on 05/02/2020 07/27/19   Antony Blackbird, MD  thiamine 100 MG tablet Take 1 tablet (100 mg total) by mouth daily. Patient not taking: Reported on 05/02/2020 09/07/19   Fulp, Ander Gaster, MD  vitamin B-12 (CYANOCOBALAMIN) 1000 MCG tablet Take 1 tablet (1,000 mcg total) by mouth daily. Due to vitamin B12 deficiency Patient not taking: Reported on 05/02/2020 05/23/19   Fulp, Ander Gaster, MD     Objective:  EXAM:   Vitals:   05/02/20 1424  BP: 114/76  Pulse: 66  SpO2: 99%  Weight: 160 lb (72.6 kg)  Height: 5' 8" (1.727 m)    General appearance : A&OX3. NAD. Non-toxic-appearing HEENT: Atraumatic and  Normocephalic.  PERRLA. EOM intact.  TM clear B. Mouth-MMM, post pharynx WNL w/o erythema, No PND. Neck: supple, no JVD. No cervical lymphadenopathy. No thyromegaly Chest/Lungs:  Breathing-non-labored, Good air entry bilaterally, breath sounds normal without rales, rhonchi, or wheezing  CVS: S1 S2 regular, no murmurs, gallops, rubs  Abdomen: Bowel sounds present, Non tender and not distended with no gaurding, rigidity or rebound. Extremities: Bilateral Lower Ext shows no edema, both legs are warm to touch with = pulse throughout Neurology:  CN II-XII grossly intact, Non focal.   Psych:  TP linear. J/I WNL. Normal speech. Appropriate eye contact and affect.  Skin:  No Rash  Data Review Lab Results  Component Value Date   HGBA1C 5.3 05/23/2019   HGBA1C 5.3 01/04/2019     Assessment & Plan   1. Neuropathy - rOPINIRole (REQUIP) 1 MG tablet; Take 1 tablet (1 mg total) by mouth at bedtime.  Dispense: 30 tablet; Refill: 2 - Ambulatory referral to Neurosurgery - Comprehensive metabolic panel - TSH - Vitamin D, 25-hydroxy - B12 and Folate Panel  2. Lumbar radiculopathy - Ambulatory referral to Neurosurgery  3. Restless leg syndrome - rOPINIRole (REQUIP) 1 MG tablet; Take 1 tablet (1 mg total) by mouth at bedtime.  Dispense: 30 tablet; Refill: 2 - TSH - Vitamin D, 25-hydroxy - B12 and Folate Panel  Patient have been counseled extensively about nutrition and exercise  Return in about 1 month (around 06/01/2020) for PCP;  chronic conditions.  The patient was given clear instructions to go to ER or return to medical center if symptoms don't improve, worsen or new problems develop. The patient verbalized understanding. The patient was told to call to get lab results if they haven't heard anything in the next week.     Freeman Caldron, PA-C [facility] and [facility] [place], [place]   05/02/2020, 2:45 PM

## 2020-05-02 NOTE — Progress Notes (Signed)
Needs refills on medications.

## 2020-05-03 LAB — COMPREHENSIVE METABOLIC PANEL
ALT: 8 IU/L (ref 0–44)
AST: 13 IU/L (ref 0–40)
Albumin/Globulin Ratio: 1.3 (ref 1.2–2.2)
Albumin: 4.2 g/dL (ref 3.8–4.9)
Alkaline Phosphatase: 105 IU/L (ref 48–121)
BUN/Creatinine Ratio: 10 (ref 9–20)
BUN: 11 mg/dL (ref 6–24)
Bilirubin Total: 0.4 mg/dL (ref 0.0–1.2)
CO2: 24 mmol/L (ref 20–29)
Calcium: 10 mg/dL (ref 8.7–10.2)
Chloride: 102 mmol/L (ref 96–106)
Creatinine, Ser: 1.05 mg/dL (ref 0.76–1.27)
GFR calc Af Amer: 92 mL/min/{1.73_m2} (ref >59)
GFR calc non Af Amer: 80 mL/min/{1.73_m2} (ref >59)
Globulin, Total: 3.3 g/dL (ref 1.5–4.5)
Glucose: 83 mg/dL (ref 65–99)
Potassium: 4.7 mmol/L (ref 3.5–5.2)
Sodium: 140 mmol/L (ref 134–144)
Total Protein: 7.5 g/dL (ref 6.0–8.5)

## 2020-05-03 LAB — TSH: TSH: 1.31 u[IU]/mL (ref 0.450–4.500)

## 2020-05-03 LAB — B12 AND FOLATE PANEL
Folate: >20 ng/mL (ref >3.0)
Vitamin B-12: 1605 pg/mL — ABNORMAL HIGH (ref 232–1245)

## 2020-05-03 LAB — VITAMIN D 25 HYDROXY (VIT D DEFICIENCY, FRACTURES): Vit D, 25-Hydroxy: 32.4 ng/mL (ref 30.0–100.0)

## 2020-06-05 ENCOUNTER — Ambulatory Visit: Payer: Self-pay | Attending: Family Medicine | Admitting: Family Medicine

## 2020-06-05 ENCOUNTER — Encounter: Payer: Self-pay | Admitting: Family Medicine

## 2020-06-05 ENCOUNTER — Other Ambulatory Visit: Payer: Self-pay

## 2020-06-05 DIAGNOSIS — M48062 Spinal stenosis, lumbar region with neurogenic claudication: Secondary | ICD-10-CM

## 2020-06-05 DIAGNOSIS — M5441 Lumbago with sciatica, right side: Secondary | ICD-10-CM

## 2020-06-05 DIAGNOSIS — F0631 Mood disorder due to known physiological condition with depressive features: Secondary | ICD-10-CM

## 2020-06-05 DIAGNOSIS — M5442 Lumbago with sciatica, left side: Secondary | ICD-10-CM

## 2020-06-05 DIAGNOSIS — G8929 Other chronic pain: Secondary | ICD-10-CM

## 2020-06-05 MED ORDER — NAPROXEN 500 MG PO TABS: 500.0000 mg | ORAL_TABLET | Freq: Two times a day (BID) | ORAL | 2 refills | Status: DC | PRN

## 2020-06-05 MED ORDER — DULOXETINE HCL 60 MG PO CPEP: 60.0000 mg | ORAL_CAPSULE | Freq: Every day | ORAL | 3 refills | Status: DC

## 2020-06-05 MED ORDER — TIZANIDINE HCL 4 MG PO TABS: ORAL_TABLET | ORAL | 4 refills | Status: DC

## 2020-06-05 MED ORDER — TRAMADOL HCL 50 MG PO TABS: 50.0000 mg | ORAL_TABLET | Freq: Two times a day (BID) | ORAL | 1 refills | Status: DC | PRN

## 2020-06-05 MED FILL — DULoxetine HCL 60 MG CPEP: 60 | 30 days supply | Qty: 30 | Fill #0

## 2020-06-05 MED FILL — tiZANidine HCL 4 MG TABS: 4 | 30 days supply | Qty: 60 | Fill #0

## 2020-06-05 MED FILL — NAPROXEN 500 MG TABLET: 500 | 30 days supply | Qty: 60 | Fill #0

## 2020-06-05 MED FILL — rOPINIRole HCL 1 MG TABS: 1 | 30 days supply | Qty: 30 | Fill #1

## 2020-06-05 MED FILL — traMADol HCL 50 MG TABS: 50 | 30 days supply | Qty: 60 | Fill #0

## 2020-06-05 NOTE — Patient Instructions (Signed)
Spinal Stenosis  Spinal stenosis happens when the open space (spinal canal) between the bones of your spine (vertebrae) gets smaller. It is caused by bone pushing into the open spaces of your backbone (spine). This puts pressure on your backbone and the nerves in your backbone. Treatment often focuses on managing any pain and symptoms. In some cases, surgery may be needed. Follow these instructions at home: Managing pain, stiffness, and swelling   Do all exercises and stretches as told by your doctor.  Stand and sit up straight (use good posture). If you were given a brace or a corset, wear it as told by your doctor.  Do not do any activities that cause pain. Ask your doctor what activities are safe for you.  Do not lift anything that is heavier than 10 lb (4.5 kg) or heavier than your doctor tells you.  Try to stay at a healthy weight. Talk with your doctor if you need help losing weight.  If directed, put heat on the affected area as often as told by your doctor. Use the heat source that your doctor recommends, such as a moist heat pack or a heating pad. ? Put a towel between your skin and the heat source. ? Leave the heat on for 20-30 minutes. ? Remove the heat if your skin turns bright red. This is especially important if you are not able to feel pain, heat, or cold. You may have a greater risk of getting burned. General instructions  Take over-the-counter and prescription medicines only as told by your doctor.  Do not use any products that contain nicotine or tobacco, such as cigarettes and e-cigarettes. If you need help quitting, ask your doctor.  Eat a healthy diet. This includes plenty of fruits and vegetables, whole grains, and low-fat (lean) protein.  Keep all follow-up visits as told by your doctor. This is important. Contact a doctor if:  Your symptoms do not get better.  Your symptoms get worse.  You have a fever. Get help right away if:  You have new or worse pain  in your neck or upper back.  You have very bad pain that medicine does not control.  You are dizzy.  You have vision problems, blurred vision, or double vision.  You have a very bad headache that is worse when you stand.  You feel sick to your stomach (nauseous).  You throw up (vomit).  You have new or worse numbness or tingling in your back or legs.  You have pain, redness, swelling, or warmth in your arm or leg. Summary  Spinal stenosis happens when the open space (spinal canal) between the bones of your spine gets smaller (narrow).  Contact a doctor if your symptoms get worse.  In some cases, surgery may be needed. This information is not intended to replace advice given to you by your health care provider. Make sure you discuss any questions you have with your health care provider. Document Revised: 10/30/2017 Document Reviewed: 10/22/2016 Elsevier Patient Education  2020 [employer].
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
## 2020-06-25 ENCOUNTER — Other Ambulatory Visit: Payer: Self-pay

## 2020-06-25 ENCOUNTER — Ambulatory Visit: Payer: Self-pay

## 2020-07-09 ENCOUNTER — Ambulatory Visit: Payer: Self-pay

## 2020-07-09 NOTE — Telephone Encounter (Signed)
Patient called and says he's been having tingling in his feet and numbness to his legs for the past 3 weeks. He says the numbness is when he's laying down sleeping and it keeps him up most of the night. He says he's been told he has neuropathy and it seems to be getting worse. He denies any other symptoms. I advised no availability that I could schedule. I called the office and no answer on the FC line. I advised the patient I will send this over to the office and have someone call with an appointment. He says anytime is fine with him. Care advice given, he verbalized understanding.   Reason for Disposition . [1] Numbness or tingling in one or both feet AND [2] is a chronic symptom (recurrent or ongoing AND present > 4 weeks)  Answer Assessment - Initial Assessment Questions 1. SYMPTOM: "What is the main symptom you are concerned about?" (e.g., weakness, numbness)     Numbness to legs when lay down to go to bed 2. ONSET: "When did this start?" (minutes, hours, days; while sleeping)     3 weeks ago 3. LAST NORMAL: "When was the last time you were normal (no symptoms)?"     3 weeks ago 4. PATTERN "Does this come and go, or has it been constant since it started?"  "Is it present now?"     Constant at night; present now 5. CARDIAC SYMPTOMS: "Have you had any of the following symptoms: chest pain, difficulty breathing, palpitations?"     Chest pain off and on 6. NEUROLOGIC SYMPTOMS: "Have you had any of the following symptoms: headache, dizziness, vision loss, double vision, changes in speech, unsteady on your feet?"     Sinus headache this morning 7. OTHER SYMPTOMS: "Do you have any other symptoms?"     Tingling in feet x 3 weeks 8. PREGNANCY: "Is there any chance you are pregnant?" "When was your last menstrual period?"     N/A  Protocols used: NEUROLOGIC DEFICIT-A-AH

## 2020-07-10 NOTE — Telephone Encounter (Signed)
Sorry but he is not a patient at [facility]

## 2020-07-12 NOTE — Telephone Encounter (Signed)
Initially sent to the wrong office, routing to correct office.

## 2020-07-13 NOTE — Telephone Encounter (Signed)
Called pt LVM informing we have scheduled him an appointment on the first available date: 08/03/20 at 11:00.

## 2020-07-13 NOTE — Telephone Encounter (Signed)
Patient called, left VM of the appointment 08/03/20 at 1100.

## 2020-08-03 ENCOUNTER — Ambulatory Visit: Payer: Self-pay | Attending: Family | Admitting: Family

## 2020-08-03 DIAGNOSIS — M5441 Lumbago with sciatica, right side: Secondary | ICD-10-CM

## 2020-08-03 DIAGNOSIS — M48062 Spinal stenosis, lumbar region with neurogenic claudication: Secondary | ICD-10-CM

## 2020-08-03 DIAGNOSIS — G8929 Other chronic pain: Secondary | ICD-10-CM

## 2020-08-03 DIAGNOSIS — M5442 Lumbago with sciatica, left side: Secondary | ICD-10-CM

## 2020-08-03 MED FILL — ?DULOXetine HCL 60MG CAPS: 60 | 30 days supply | Qty: 30 | Fill #1

## 2020-08-03 NOTE — Progress Notes (Signed)
Virtual Visit via Telephone Note  I connected with Wesley [name], on 08/03/2020 at 10:36 PM by telephone due to the COVID-19 pandemic and verified that I am speaking with the correct person using two identifiers.  Due to current restrictions/limitations of in-office visits due to the COVID-19 pandemic, this scheduled clinical appointment was converted to a telehealth visit.   Consent: I discussed the limitations, risks, security and privacy concerns of performing an evaluation and management service by telephone and the availability of in person appointments. I also discussed with the patient that there may be a patient responsible charge related to this service. The patient expressed understanding and agreed to proceed.  Location of Patient: Home  Location of Provider: [place]  Persons participating in Telemedicine visit: Sonia N Crean Durene Fruits, NP Eddie Dibbles, CMA  History of Present Illness: Wesley [name] is a 54 year old male with history of chronic viral hepatitis and alcohol dependence in remission who presents for numbness and tingling in feet.   1. NUMBNESS AND TINGLING LEGS: 06/05/2020:  Visit with Dr. Margarita Rana. During that encounter neurogenic claudication due to lumbar spinal stenosis and chronic midline low back pain with bilateral sciatica uncontrolled. Unable to be seen by Neurosurgery related to lack of insurance. Consideration for referral to [facility] for possible epidural spinal injections. Patient had not applied for [place] financial discount/orange card at that time. Cymbalta added. Refilled Tramadol, Naproxen, and Tizanidine.  08/03/2020: Duration: months Location: legs  Bilateral: yes Symmetric: yes Decreased sensation: yes while sitting/laying down  Weakness: yes Pain: no Quality:  reports bilateral legs do not hurt, reports lower back always hurts Severity: not applicable  Frequency: intermittent Diabetes: no Thyroid  disease: no  Alcoholism: no  Spinal cord injury: history of lumbar spinal stenosis with neurogenic claudication Alleviating factors: rubbing, standing Aggravating factors: sitting/laying down Status: stable   Past Medical History:  Diagnosis Date  . Acute pancreatitis   . Alcohol abuse   . Cholecystitis 01/2018  . Chronic hepatitis C ([place])   . History of blood transfusion   . Neuropathy   . Radial nerve compression    right   No Known Allergies  Current Outpatient Medications on File Prior to Visit  Medication Sig Dispense Refill  . DULoxetine (CYMBALTA) 60 MG capsule Take 1 capsule (60 mg total) by mouth daily. 30 capsule 3  . folic acid (FOLVITE) 1 MG tablet Take 1 tablet (1 mg total) by mouth daily. 90 tablet 3  . hydrocortisone cream 1 % Apply to affected area 2 times daily (Patient not taking: Reported on 05/02/2020) 15 g 0  . Multiple Vitamin (MULTIVITAMIN) tablet Take 1 tablet by mouth daily.    . Multiple Vitamins-Minerals (ZINC PO) Take by mouth daily.    . naproxen (NAPROSYN) 500 MG tablet Take 1 tablet (500 mg total) by mouth 2 (two) times daily as needed for moderate pain. 60 tablet 2  . ondansetron (ZOFRAN) 4 MG tablet Take 1 tablet (4 mg total) by mouth every 8 (eight) hours as needed for nausea or vomiting. (Patient not taking: Reported on 05/02/2020) 20 tablet 3  . pantoprazole (PROTONIX) 40 MG tablet Take 1 tablet (40 mg total) by mouth daily. (Patient not taking: Reported on 05/02/2020) 30 tablet 3  . pregabalin (LYRICA) 150 MG capsule Take 1 capsule (150 mg total) by mouth 2 (two) times daily. For nerve pain 180 capsule 1  . rOPINIRole (REQUIP) 1 MG tablet Take 1 tablet (1 mg total)  by mouth at bedtime. 30 tablet 2  . tamsulosin (FLOMAX) 0.4 MG CAPS capsule Take 1 capsule (0.4 mg total) by mouth daily. 30 capsule 3  . thiamine 100 MG tablet Take 1 tablet (100 mg total) by mouth daily. (Patient not taking: Reported on 05/02/2020) 30 tablet 6  . tiZANidine (ZANAFLEX) 4 MG  tablet One or two pills at bedtime as needed for muscle spasm 60 tablet 4  . traMADol (ULTRAM) 50 MG tablet Take 1 tablet (50 mg total) by mouth every 12 (twelve) hours as needed. 60 tablet 1  . vitamin B-12 (CYANOCOBALAMIN) 1000 MCG tablet Take 1 tablet (1,000 mcg total) by mouth daily. Due to vitamin B12 deficiency (Patient not taking: Reported on 05/02/2020) 30 tablet 6   No current facility-administered medications on file prior to visit.    Observations/Objective: Alert and oriented x 3. Not in acute distress. Physical examination not completed as this is a telemedicine visit.  Assessment and Plan: 1. Neurogenic claudication due to lumbar spinal stenosis: 2. Chronic midline low back pain with bilateral sciatica: - Visit 06/05/2020. During that encounter neurogenic claudication due to lumbar spinal stenosis and chronic midline low back pain with bilateral sciatica uncontrolled. Unable to be seen by Neurosurgery related to lack of insurance. Consideration for referral to [facility] for possible epidural spinal injections. Cymbalta added. Refilled Tramadol, Naproxen, and Tizanidine. -Today reports bilateral leg numbness only while sitting/laying down. Reports lower back still causing pain on consistent daily basis. Reports he is taking Cymbalta, Tramadol, Naproxen, and Tizanidine as prescribed. Reports he does have [place] discount/orange card at this time.  - Referral to Neurology for further evaluation and management.  - Referral to Physical Medicine Rehab for further evaluation and management.  - Follow-up with primary physician as needed.  - Ambulatory referral to Neurology - Ambulatory referral to Physical Medicine Rehab  Follow Up Instructions: Referral to Neurology and Physical Medicine Rehab. Follow-up with primary physician as needed.   Patient was given clear instructions to go to Emergency Department or return to medical center if symptoms don't improve, worsen, or  new problems develop.The patient verbalized understanding.  I discussed the assessment and treatment plan with the patient. The patient was provided an opportunity to ask questions and all were answered. The patient agreed with the plan and demonstrated an understanding of the instructions.   The patient was advised to call back or seek an in-person evaluation if the symptoms worsen or if the condition fails to improve as anticipated.   I provided 10 minutes total of non-face-to-face time during this encounter including median intraservice time, reviewing previous notes, labs, imaging, medications, management and patient verbalized understanding.    Camillia Herter, NP  [facility] and [facility] [place], [place]   08/03/2020, 7:44 AM

## 2020-08-03 NOTE — Patient Instructions (Addendum)
Referral to Neurology and Physical Medicine Rehab. Follow-up with primary physician as needed. Chronic Back Pain When back pain lasts longer than 3 months, it is called chronic back pain.The cause of your back pain may not be known. Some common causes include:  Wear and tear (degenerative disease) of the bones, ligaments, or disks in your back.  Inflammation and stiffness in your back (arthritis). People who have chronic back pain often go through certain periods in which the pain is more intense (flare-ups). Many people can learn to manage the pain with home care. Follow these instructions at home: Pay attention to any changes in your symptoms. Take these actions to help with your pain: Activity   Avoid bending and other activities that make the problem worse.  Maintain a proper position when standing or sitting: ? When standing, keep your upper back and neck straight, with your shoulders pulled back. Avoid slouching. ? When sitting, keep your back straight and relax your shoulders. Do not round your shoulders or pull them backward.  Do not sit or stand in one place for long periods of time.  Take brief periods of rest throughout the [place]. This will reduce your pain. Resting in a lying or standing position is usually better than sitting to rest.  When you are resting for longer periods, mix in some mild activity or stretching between periods of rest. This will help to prevent stiffness and pain.  Get regular exercise. Ask your health care provider what activities are safe for you.  Do not lift anything that is heavier than 10 lb (4.5 kg). Always use proper lifting technique, which includes: ? Bending your knees. ? Keeping the load close to your body. ? Avoiding twisting.  Sleep on a firm mattress in a comfortable position. Try lying on your side with your knees slightly bent. If you lie on your back, put a pillow under your knees. Managing pain  If directed, apply ice to the  painful area. Your health care provider may recommend applying ice during the first 24-48 hours after a flare-up begins. ? Put ice in a plastic bag. ? Place a towel between your skin and the bag. ? Leave the ice on for 20 minutes, 2-3 times per [place].  If directed, apply heat to the affected area as often as told by your health care provider. Use the heat source that your health care provider recommends, such as a moist heat pack or a heating pad. ? Place a towel between your skin and the heat source. ? Leave the heat on for 20-30 minutes. ? Remove the heat if your skin turns bright red. This is especially important if you are unable to feel pain, heat, or cold. You may have a greater risk of getting burned.  Try soaking in a warm tub.  Take over-the-counter and prescription medicines only as told by your health care provider.  Keep all follow-up visits as told by your health care provider. This is important. Contact a health care provider if:  You have pain that is not relieved with rest or medicine. Get help right away if:  You have weakness or numbness in one or both of your legs or feet.  You have trouble controlling your bladder or your bowels.  You have nausea or vomiting.  You have pain in your abdomen.  You have shortness of breath or you faint. This information is not intended to replace advice given to you by your health care provider. Make sure you  discuss any questions you have with your health care provider. Document Revised: 03/10/2019 Document Reviewed: 05/27/2017 Elsevier Patient Education  [place].

## 2020-08-15 ENCOUNTER — Telehealth: Payer: Self-pay | Admitting: Family Medicine

## 2020-08-15 NOTE — Telephone Encounter (Signed)
Please advise.   Copied from [place] [phone]. Topic: General - Other >> Aug 15, 2020  2:51 PM Alanda Slim E wrote: Reason for CRM: Pt was sent to [facility] but they dont accept the orange card /they accept [place] Financial assistance / Pt would like to speak with Clifton James or PCP about the next step/ please advise

## 2020-08-17 NOTE — Telephone Encounter (Signed)
Patient has an appointment  10-22-20 @ 10:30am   [facility]   FPL: 120 Eff: 06/25/2020 - 12/26/2020 FPL Status: Approved for Financial Assistance   The letter is not scan in his chart

## 2020-09-05 ENCOUNTER — Ambulatory Visit: Payer: Self-pay | Attending: Family Medicine | Admitting: Family Medicine

## 2020-09-05 ENCOUNTER — Other Ambulatory Visit: Payer: Self-pay

## 2020-09-05 ENCOUNTER — Other Ambulatory Visit: Payer: Self-pay | Admitting: Family Medicine

## 2020-09-05 ENCOUNTER — Encounter: Payer: Self-pay | Admitting: Family Medicine

## 2020-09-05 VITALS — BP 127/78 | HR 64 | Temp 97.2°F | Ht 68.0 in | Wt 145.2 lb

## 2020-09-05 DIAGNOSIS — M5442 Lumbago with sciatica, left side: Secondary | ICD-10-CM

## 2020-09-05 DIAGNOSIS — M5441 Lumbago with sciatica, right side: Secondary | ICD-10-CM

## 2020-09-05 DIAGNOSIS — M79671 Pain in right foot: Secondary | ICD-10-CM

## 2020-09-05 DIAGNOSIS — G8929 Other chronic pain: Secondary | ICD-10-CM

## 2020-09-05 DIAGNOSIS — B350 Tinea barbae and tinea capitis: Secondary | ICD-10-CM

## 2020-09-05 DIAGNOSIS — F418 Other specified anxiety disorders: Secondary | ICD-10-CM

## 2020-09-05 DIAGNOSIS — M48062 Spinal stenosis, lumbar region with neurogenic claudication: Secondary | ICD-10-CM

## 2020-09-05 DIAGNOSIS — M79672 Pain in left foot: Secondary | ICD-10-CM

## 2020-09-05 DIAGNOSIS — L989 Disorder of the skin and subcutaneous tissue, unspecified: Secondary | ICD-10-CM

## 2020-09-05 DIAGNOSIS — R634 Abnormal weight loss: Secondary | ICD-10-CM

## 2020-09-05 DIAGNOSIS — R4589 Other symptoms and signs involving emotional state: Secondary | ICD-10-CM

## 2020-09-05 MED ORDER — GRISEOFULVIN ULTRAMICROSIZE 250 MG PO TABS: 250.0000 mg | ORAL_TABLET | Freq: Every day | ORAL | 1 refills | Status: DC

## 2020-09-05 MED ORDER — HYDROCODONE-ACETAMINOPHEN 5-300 MG PO TABS: ORAL_TABLET | ORAL | 0 refills | Status: DC

## 2020-09-05 MED ORDER — GRISEOFULVIN MICROSIZE 500 MG PO TABS: 500.0000 mg | ORAL_TABLET | Freq: Every day | ORAL | 1 refills | Status: DC
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
## 2020-09-05 NOTE — Progress Notes (Signed)
Edema in toes Scab in top of head month

## 2020-09-05 NOTE — Patient Instructions (Signed)
What You Need to Know About Chronic Back Pain Long-term (chronic) back pain is back pain that lasts for 12 weeks or longer. It often affects the lower back and can range from mild to severe. Many people have back pain at some point in their lives. It can feel different to each person. It may feel like a muscle ache or a sharp, stabbing pain. The pain often gets worse over time. It can be difficult to find the cause of chronic back pain. Treating chronic back pain often starts with rest and pain relief, followed by exercises (physical therapy) to strengthen the muscles that support your back. You may have to try different things to see what works best for you. If other treatments do not help, or if your pain is caused by a condition or an injury, you may need surgery. How can back pain affect me? Chronic back pain is uncomfortable and can make it hard to do your usual daily activities. Chronic back pain can:  Cause numbness and tingling.  Come and go.  Get worse when you are sitting, standing, walking, bending, or lifting.  Affect you while you are active, at rest, or both.  Eventually make it hard to move around.  Occur with fever, weight loss, or difficulty urinating. What are the benefits of treating back pain? Treating chronic back pain may:  Relieve pain.  Keep your pain from getting worse.  Make it easier for you to do your usual activities. What are some steps I can take to decrease my back pain?   Take over-the-counter or prescription medicines only as told by your health care provider.  If directed, apply heat to the affected area. Use the heat source that your health care provider recommends, such as a moist heat pack or a heating pad. ? Place a towel between your skin and the heat source. ? Leave the heat on for 20-30 minutes. ? Remove the heat if your skin turns bright red. This is especially important if you are unable to feel pain, heat, or cold. You may have a greater  risk of getting burned.  If directed, put ice on the affected area: ? Put ice in a plastic bag. ? Place a towel between your skin and the bag. ? Leave the ice on for 20 minutes, 2-3 times a [place].  Get regular exercise as told by your health care provider to improve flexibility and strength.  Do not smoke.  Maintain a healthy weight.  When lifting objects: ? Keep your feet as far apart as your shoulders (shoulder-width apart) or farther apart. ? Tighten the muscles in your abdomen. ? Bend your knees and hips and keep your spine neutral. It is important to lift using the strength of your legs, not your back. Do not lock your knees straight out. ? Always ask for help to lift heavy or awkward objects. What can happen if my back pain goes untreated? Untreated back pain can:  Get worse over time.  Start to occur more often or at different times, such as when you are resting.  Cause posture problems.  Make it hard to move around (limit mobility). Where can I get support? Chronic back pain can be a frustrating condition to manage. It may help to talk with other people who are having a similar experience. Consider joining a support group for people dealing with chronic back pain. Ask your health care provider about support groups in your area. You can also find online and  in-person support groups through:  The American Chronic Pain Association: DeluxeOption.si  The U.S. Pain Foundation: uspainfoundation.org/support-groups Contact a health care provider if:  Your symptoms do not get better or they get worse.  You have severe back pain.  You have chronic back pain and a fever.  You lose weight without trying.  You have difficulty urinating.  You experience numbness or tingling.  You develop new pain after an injury. Summary  Chronic back pain is often treated with rest, pain relief, and physical therapy.  Get regular exercise to improve your strength and  flexibility.  Put heat and ice on the affected areas as directed by your health care provider.  Chronic back pain can be challenging to live with. Joining a support group may help you manage your condition. This information is not intended to replace advice given to you by your health care provider. Make sure you discuss any questions you have with your health care provider. Document Revised: 10/30/2017 Document Reviewed: 07/26/2016 Elsevier Patient Education  2020 [employer].

## 2020-09-06 LAB — COMPREHENSIVE METABOLIC PANEL WITH GFR
ALT: 8 IU/L (ref 0–44)
AST: 13 IU/L (ref 0–40)
Albumin/Globulin Ratio: 1.4 (ref 1.2–2.2)
Albumin: 4.2 g/dL (ref 3.8–4.9)
Alkaline Phosphatase: 105 IU/L (ref 44–121)
BUN/Creatinine Ratio: 7 — ABNORMAL LOW (ref 9–20)
BUN: 7 mg/dL (ref 6–24)
Bilirubin Total: 0.4 mg/dL (ref 0.0–1.2)
CO2: 24 mmol/L (ref 20–29)
Calcium: 9.3 mg/dL (ref 8.7–10.2)
Chloride: 102 mmol/L (ref 96–106)
Creatinine, Ser: 0.99 mg/dL (ref 0.76–1.27)
GFR calc Af Amer: 99 mL/min/1.73
GFR calc non Af Amer: 85 mL/min/1.73
Globulin, Total: 3 g/dL (ref 1.5–4.5)
Glucose: 90 mg/dL (ref 65–99)
Potassium: 4.4 mmol/L (ref 3.5–5.2)
Sodium: 140 mmol/L (ref 134–144)
Total Protein: 7.2 g/dL (ref 6.0–8.5)

## 2020-09-06 LAB — CBC WITH DIFFERENTIAL/PLATELET
Basophils Absolute: 0.1 x10E3/uL (ref 0.0–0.2)
Basos: 1 %
EOS (ABSOLUTE): 0.6 x10E3/uL — ABNORMAL HIGH (ref 0.0–0.4)
Eos: 7 %
Hematocrit: 47.8 % (ref 37.5–51.0)
Hemoglobin: 16.2 g/dL (ref 13.0–17.7)
Immature Grans (Abs): 0 x10E3/uL (ref 0.0–0.1)
Immature Granulocytes: 0 %
Lymphocytes Absolute: 2.8 x10E3/uL (ref 0.7–3.1)
Lymphs: 35 %
MCH: 30.7 pg (ref 26.6–33.0)
MCHC: 33.9 g/dL (ref 31.5–35.7)
MCV: 91 fL (ref 79–97)
Monocytes Absolute: 0.9 x10E3/uL (ref 0.1–0.9)
Monocytes: 11 %
Neutrophils Absolute: 3.6 x10E3/uL (ref 1.4–7.0)
Neutrophils: 46 %
Platelets: 277 x10E3/uL (ref 150–450)
RBC: 5.27 x10E6/uL (ref 4.14–5.80)
RDW: 13.2 % (ref 11.6–15.4)
WBC: 8.1 x10E3/uL (ref 3.4–10.8)

## 2020-09-06 LAB — URIC ACID: Uric Acid: 4.2 mg/dL (ref 3.8–8.4)

## 2020-09-06 MED FILL — GRISEOFULVIN MICROSIZE 500: 500 | 30 days supply | Qty: 30 | Fill #0

## 2020-09-06 MED FILL — GRISEOFULVIN ULTRAMICROSIZE: 250 | 30 days supply | Qty: 30 | Fill #0

## 2020-09-14 ENCOUNTER — Telehealth: Payer: Self-pay | Admitting: Licensed Clinical Social Worker

## 2020-09-14 NOTE — Telephone Encounter (Signed)
Call placed to patient regarding [facility] referral. LCSW left message requesting a return call.

## 2020-09-18 ENCOUNTER — Encounter: Payer: Self-pay | Admitting: Orthopaedic Surgery

## 2020-09-18 ENCOUNTER — Ambulatory Visit (INDEPENDENT_AMBULATORY_CARE_PROVIDER_SITE_OTHER): Payer: Self-pay | Admitting: Orthopaedic Surgery

## 2020-09-18 ENCOUNTER — Other Ambulatory Visit: Payer: Self-pay

## 2020-09-18 VITALS — BP 104/71 | HR 89 | Ht 68.0 in | Wt 145.0 lb

## 2020-09-18 DIAGNOSIS — G8929 Other chronic pain: Secondary | ICD-10-CM

## 2020-09-18 DIAGNOSIS — M545 Low back pain, unspecified: Secondary | ICD-10-CM
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
## 2020-09-19 ENCOUNTER — Telehealth: Payer: Self-pay

## 2020-09-19 NOTE — Telephone Encounter (Signed)
Patient called he is returning a call to schedule a appointment with Dr.Newton. call back:[phone]

## 2020-09-20 ENCOUNTER — Ambulatory Visit (INDEPENDENT_AMBULATORY_CARE_PROVIDER_SITE_OTHER): Payer: No Typology Code available for payment source

## 2020-09-20 ENCOUNTER — Encounter: Payer: Self-pay | Admitting: Podiatry

## 2020-09-20 ENCOUNTER — Ambulatory Visit (INDEPENDENT_AMBULATORY_CARE_PROVIDER_SITE_OTHER): Payer: No Typology Code available for payment source | Admitting: Podiatry

## 2020-09-20 ENCOUNTER — Other Ambulatory Visit: Payer: Self-pay

## 2020-09-20 DIAGNOSIS — R202 Paresthesia of skin: Secondary | ICD-10-CM

## 2020-09-20 DIAGNOSIS — R52 Pain, unspecified: Secondary | ICD-10-CM

## 2020-09-20 DIAGNOSIS — R200 Anesthesia of skin: Secondary | ICD-10-CM

## 2020-09-20 NOTE — Telephone Encounter (Signed)
Called pt back and sch 11/17.

## 2020-09-21 ENCOUNTER — Encounter: Payer: Self-pay | Admitting: Podiatry

## 2020-09-21 NOTE — Progress Notes (Signed)
Subjective:  Patient ID: Wesley [name], male    DOB: January 16, 1966,  MRN: [id number]  Chief Complaint  Patient presents with  . Foot Pain    pt states he has bilateral foot pain     54 y.o. male presents with the above complaint.  Patient presents with complaint of bilateral numbness and tingling to both lower extremity up the leg.  Patient states that is been have been going on for 2 to 3 months.  Patient states that it has progressive gotten worse over the last 3 years.  Patient states the medication which she is currently on Lyrica has not helped.  He follows spine specialist for which she is scheduled to get an injection in the near future.  Patient states that it hurts when he walks.  He feels like he is walking and being cold all the time.  There is burning sensation at all times.  He states the pain scale is 10 out of 10.  He has history of lumbar stenosis and severe osteoarthrosis from the lower lumbar region.  He denies any other acute complaints.  He wants to discuss if there is anything that can be done in the foot and ankle.   Review of Systems: Negative except as noted in the HPI. Denies N/V/F/Ch.  Past Medical History:  Diagnosis Date  . Acute pancreatitis   . Alcohol abuse   . Cholecystitis 01/2018  . Chronic hepatitis C ([facility])   . History of blood transfusion   . Neuropathy   . Radial nerve compression    right    Current Outpatient Medications:  .  DULoxetine (CYMBALTA) 60 MG capsule, Take 1 capsule (60 mg total) by mouth daily., Disp: 30 capsule, Rfl: 3 .  folic acid (FOLVITE) 1 MG tablet, Take 1 tablet (1 mg total) by mouth daily., Disp: 90 tablet, Rfl: 3 .  griseofulvin (GRIFULVIN V) 500 MG tablet, Take 1 tablet (500 mg total) by mouth daily. Along with 250 mg tablet (750 mg total), Disp: 30 tablet, Rfl: 1 .  griseofulvin (GRIS-PEG) 250 MG tablet, Take 1 tablet (250 mg total) by mouth daily. Along with 500 mg to make 750 mg daily, Disp: 30 tablet, Rfl: 1 .   HYDROcodone-Acetaminophen 5-300 MG TABS, One pill twice per Trainer as needed for severe pain, Disp: 60 tablet, Rfl: 0 .  hydrocortisone cream 1 %, Apply to affected area 2 times daily, Disp: 15 g, Rfl: 0 .  Multiple Vitamin (MULTIVITAMIN) tablet, Take 1 tablet by mouth daily., Disp: , Rfl:  .  Multiple Vitamins-Minerals (ZINC PO), Take by mouth daily., Disp: , Rfl:  .  naproxen (NAPROSYN) 500 MG tablet, Take 1 tablet (500 mg total) by mouth 2 (two) times daily as needed for moderate pain., Disp: 60 tablet, Rfl: 2 .  ondansetron (ZOFRAN) 4 MG tablet, Take 1 tablet (4 mg total) by mouth every 8 (eight) hours as needed for nausea or vomiting., Disp: 20 tablet, Rfl: 3 .  pantoprazole (PROTONIX) 40 MG tablet, Take 1 tablet (40 mg total) by mouth daily., Disp: 30 tablet, Rfl: 3 .  pregabalin (LYRICA) 150 MG capsule, Take 1 capsule (150 mg total) by mouth 2 (two) times daily. For nerve pain, Disp: 180 capsule, Rfl: 1 .  rOPINIRole (REQUIP) 1 MG tablet, Take 1 tablet (1 mg total) by mouth at bedtime., Disp: 30 tablet, Rfl: 2 .  tamsulosin (FLOMAX) 0.4 MG CAPS capsule, Take 1 capsule (0.4 mg total) by mouth daily., Disp: 30 capsule, Rfl:  3 .  thiamine 100 MG tablet, Take 1 tablet (100 mg total) by mouth daily., Disp: 30 tablet, Rfl: 6 .  tiZANidine (ZANAFLEX) 4 MG tablet, One or two pills at bedtime as needed for muscle spasm, Disp: 60 tablet, Rfl: 4 .  vitamin B-12 (CYANOCOBALAMIN) 1000 MCG tablet, Take 1 tablet (1,000 mcg total) by mouth daily. Due to vitamin B12 deficiency, Disp: 30 tablet, Rfl: 6  Social History   Tobacco Use  Smoking Status Current Every [place] Smoker  . Packs/[place]: 0.75  . Years: 42.00  . Pack years: 31.50  . Types: Cigarettes  Smokeless Tobacco Never Used    No Known Allergies Objective:  There were no vitals filed for this visit. There is no height or weight on file to calculate BMI. Constitutional Well developed. Well nourished.  Vascular Dorsalis pedis pulses palpable  bilaterally. Posterior tibial pulses palpable bilaterally. Capillary refill normal to all digits.  No cyanosis or clubbing noted. Pedal hair growth normal.  Neurologic Normal speech. Oriented to person, place, and time. Decreased sensation to light touch grossly present bilaterally.  Decreased vibratory sensation as well.  Decreased protective sensation when tested with [employer]es monofilament testing.  Negative tarsal tunnel compression or common peroneal compression  Dermatologic Nails well groomed and normal in appearance. No open wounds. No skin lesions.  Orthopedic:  Manual muscle testing 5 out of 5 bilaterally   Radiographs: 3 views of skeletally mature adult bilateral foot: No osseous abnormalities noted to bilateral lower extremity.  No other bony abnormalities noted.  No signs of fractures noted. Assessment:   1. Numbness and tingling    Plan:  Patient was evaluated and treated and all questions answered.  Bilateral numbness and tingling to both lower extremity -I explained to the patient the etiology of numbness and tingling various treatment options were discussed.  This is likely attributed to the lumbar stenosis leading to severe neurological pain to the both lower extremity.  I discussed this with the patient in extensive detail.  Patient is scheduled to follow-up with a spine specialist to undergo back injections to help with the pain.  I believe that ultimately patient will benefit from diagram the lower extremity as well.  For now unfortunately I am not able to provide any kind of relief as primarily this is coming from the lower back. -Continue Lyrica -I will see him back as needed  No follow-ups on file.

## 2020-10-05 ENCOUNTER — Other Ambulatory Visit: Payer: Self-pay | Admitting: Dermatology

## 2020-10-05 MED FILL — TRIAMCINOLONE 0.1% OINTMENT: 0.1 | 30 days supply | Qty: 80 | Fill #0

## 2020-10-08 ENCOUNTER — Encounter: Payer: Self-pay | Admitting: Family Medicine

## 2020-10-08 ENCOUNTER — Ambulatory Visit (HOSPITAL_BASED_OUTPATIENT_CLINIC_OR_DEPARTMENT_OTHER): Payer: Self-pay | Admitting: Licensed Clinical Social Worker

## 2020-10-08 ENCOUNTER — Other Ambulatory Visit: Payer: Self-pay

## 2020-10-08 ENCOUNTER — Ambulatory Visit: Payer: Self-pay | Attending: Family Medicine | Admitting: Family Medicine

## 2020-10-08 VITALS — BP 129/82 | HR 72 | Wt 150.8 lb

## 2020-10-08 DIAGNOSIS — M5442 Lumbago with sciatica, left side: Secondary | ICD-10-CM

## 2020-10-08 DIAGNOSIS — Z8269 Family history of other diseases of the musculoskeletal system and connective tissue: Secondary | ICD-10-CM

## 2020-10-08 DIAGNOSIS — F331 Major depressive disorder, recurrent, moderate: Secondary | ICD-10-CM

## 2020-10-08 DIAGNOSIS — Z79899 Other long term (current) drug therapy: Secondary | ICD-10-CM

## 2020-10-08 DIAGNOSIS — G8929 Other chronic pain: Secondary | ICD-10-CM

## 2020-10-08 DIAGNOSIS — M5441 Lumbago with sciatica, right side: Secondary | ICD-10-CM

## 2020-10-08 DIAGNOSIS — L989 Disorder of the skin and subcutaneous tissue, unspecified: Secondary | ICD-10-CM

## 2020-10-08 DIAGNOSIS — M48062 Spinal stenosis, lumbar region with neurogenic claudication: Secondary | ICD-10-CM

## 2020-10-08 DIAGNOSIS — M792 Neuralgia and neuritis, unspecified: Secondary | ICD-10-CM

## 2020-10-08 DIAGNOSIS — F411 Generalized anxiety disorder: Secondary | ICD-10-CM

## 2020-10-08 DIAGNOSIS — F0631 Mood disorder due to known physiological condition with depressive features: Secondary | ICD-10-CM

## 2020-10-08 MED FILL — ?DULOXetine HCL 60MG CAPS: 60 | 30 days supply | Qty: 30 | Fill #2

## 2020-10-08 NOTE — Progress Notes (Signed)
PAIN NOW IN THIGH AREA  HAS NOT PICKED UP CREAM FOR RASH ON HEAD
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
## 2020-10-17 ENCOUNTER — Ambulatory Visit: Payer: Self-pay

## 2020-10-17 ENCOUNTER — Encounter: Payer: Self-pay | Admitting: Physical Medicine and Rehabilitation

## 2020-10-17 ENCOUNTER — Ambulatory Visit (INDEPENDENT_AMBULATORY_CARE_PROVIDER_SITE_OTHER): Payer: Self-pay | Admitting: Physical Medicine and Rehabilitation

## 2020-10-17 ENCOUNTER — Telehealth: Payer: Self-pay

## 2020-10-17 ENCOUNTER — Other Ambulatory Visit: Payer: Self-pay

## 2020-10-17 VITALS — BP 115/76 | HR 89

## 2020-10-17 DIAGNOSIS — M48061 Spinal stenosis, lumbar region without neurogenic claudication: Secondary | ICD-10-CM

## 2020-10-17 MED ORDER — BETAMETHASONE SOD PHOS & ACET 6 (3-3) MG/ML IJ SUSP
12.0000 mg | Freq: Once | INTRAMUSCULAR | Status: AC
  Administered 2020-10-17: 12 mg

## 2020-10-17 NOTE — Telephone Encounter (Signed)
Copied from [place] [phone]. Topic: General - Other >> Oct 17, 2020  2:45 PM Rainey Pines A wrote: Pt requesting callback from [facility] to go over lab resutls

## 2020-10-17 NOTE — Progress Notes (Signed)
Pt state lower back pain. Pt state sitting for a long time then having to get up from a chair makes the pain worse. Pt state when laying down he can feel pain running down both legs. Pt state he takes pain meds to help ease the pain.  Numeric Pain Rating Scale and Functional Assessment Average Pain 7   In the last MONTH (on 0-10 scale) has pain interfered with the following?  1. General activity like being  able to carry out your everyday physical activities such as walking, climbing stairs, carrying groceries, or moving a chair?  Rating(9)   +Driver, -BT, -Dye Allergies.

## 2020-10-20 NOTE — BH Specialist Note (Signed)
Integrated Behavioral Health Initial Visit  MRN: [id number] Name: Wesley [name]  Number of [facility] Clinician visits:: 1/6 Session Start time: 9:00 AM  Session End time: 9:25 AM Total time: 25  Type of Service: [place] Interpretor:No. Interpretor Name and Language: NA   Warm Hand Off Completed.       SUBJECTIVE: Wesley [name] is a 54 y.o. male accompanied by self Patient was referred by Dr. Chapman Fitch for depression and anxiety. Patient reports the following symptoms/concerns: Pt reports increase in depression and anxiety symptom triggered by inability to work, financial strain, and chronic pain Duration of problem: Ongoing; Severity of problem: moderate  OBJECTIVE: Mood: Anxious and Affect: Appropriate Risk of harm to self or others: No plan to harm self or others  LIFE CONTEXT: Family and Social: Pt receives strong support from family School/Work: Pt has been out of work for approx 3 years. States that he has ran out of savings from 401k; however, was denied disability 2 times. Pt has obtained a lawyer next appeal is scheduled for Feb 2022 Self-Care: Pt has applied for financial counseling to assist with medical costs  Life Changes: Pt is experiencing stress triggered by chronic medical conditions and financial strain  STRENGTHS: Pt has social connections Pt is participating in med management Pt has concrete supports   GOALS ADDRESSED: Patient will: 1. Reduce symptoms of: anxiety and depression Pt agreed to continue with compliance with medication management 2. Increase knowledge and/or ability of: coping skills Pt agreed to utilize strategies to assist with managing with stress  PROGRESS OF GOALS: Ongoing  INTERVENTIONS: Interventions utilized: Solution-Focused Strategies  Standardized Assessments completed: GAD-7 and PHQ 2&9  ASSESSMENT: Patient currently experiencing difficulty managing increase in depression and  anxiety symptoms triggered by chronic pain and psychosocial stressors.   Patient may benefit from therapy and continued medication management. Healthy coping skills discussed to assist with management of symptoms  PLAN: 1. Follow up with behavioral health clinician on : Contact LCSW with additional behavioral health and/or resource needs 2. Behavioral recommendations: Utilize healthy strategies discussed 3. Referral(s): [facility] (In Clinic) 4. "From scale of 1-10, how likely are you to follow plan?":   Rebekah Chesterfield, LCSW 10/20/2020 3:07 PM

## 2020-10-22 ENCOUNTER — Encounter: Payer: Self-pay | Admitting: Diagnostic Neuroimaging

## 2020-10-22 ENCOUNTER — Ambulatory Visit (INDEPENDENT_AMBULATORY_CARE_PROVIDER_SITE_OTHER): Payer: Self-pay | Admitting: Diagnostic Neuroimaging

## 2020-10-22 VITALS — BP 117/75 | HR 87 | Ht 68.0 in | Wt 156.4 lb

## 2020-10-22 DIAGNOSIS — M5417 Radiculopathy, lumbosacral region: Secondary | ICD-10-CM

## 2020-10-22 DIAGNOSIS — M48062 Spinal stenosis, lumbar region with neurogenic claudication: Secondary | ICD-10-CM

## 2020-10-22 DIAGNOSIS — G621 Alcoholic polyneuropathy: Secondary | ICD-10-CM

## 2020-10-22 DIAGNOSIS — E538 Deficiency of other specified B group vitamins: Secondary | ICD-10-CM

## 2020-10-22 MED FILL — FOLIC ACID 1 MG TABS: 1 | 90 days supply | Qty: 90 | Fill #1
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
## 2020-10-22 NOTE — Patient Instructions (Signed)
  CHRONIC LOW BACK / BILATERAL LEG PAIN DUE TO LUMBAR SPINAL STENOSIS AND NEUROPATHY (alcoholic and K53 deficiency) --> referred to [place] by Durene Fruits, NP --> patient has already seen multiple specialists for same issue in the past --> Dr. Morrie Sheldon (neurology), Dr. Letta Pate (PMR), Dr. Keith Rake (podiatry), Dr. [name] (orthopedics), Dr. Ernestina Patches (PMR) - continue lyrica and cymbalta; continue vitamin supplements - follow up with PCP and pain mgmt; continue PT exercises  CHRONIC NECK PAIN / HAND NUMBNESS (has seen Dr. Kathyrn Sheriff (neurosurgery) and Dr. Percell Miller (orthopedic) for this in the past) - follow up with orthopedics and neurosurgery

## 2020-10-29 LAB — LUPUS ANTICOAGULANT PANEL
APTT: 27.9 s
Anticardiolipin Ab, IgG: <10 [GPL'U]
Anticardiolipin Ab, IgM: 14 [MPL'U]
Beta-2 Glycoprotein I, IgA: 18 SAU
Beta-2 Glycoprotein I, IgG: <10 SGU
Beta-2 Glycoprotein I, IgM: 17 SMU
DRVVT Screen Seconds: 33.7 s
Hexagonal Phospholipid Neutral: 0 s
INR: 1 ratio
Platelet Neutralization: 1.9 s
Prothrombin Time: 10.7 s
Thrombin Time: 17.3 s

## 2020-10-29 LAB — COMPREHENSIVE METABOLIC PANEL WITH GFR
ALT: 9 IU/L (ref 0–44)
AST: 14 IU/L (ref 0–40)
Albumin/Globulin Ratio: 1.5 (ref 1.2–2.2)
Albumin: 4.8 g/dL (ref 3.8–4.9)
Alkaline Phosphatase: 111 IU/L (ref 44–121)
BUN/Creatinine Ratio: 8 — ABNORMAL LOW (ref 9–20)
BUN: 7 mg/dL (ref 6–24)
Bilirubin Total: 0.5 mg/dL (ref 0.0–1.2)
CO2: 27 mmol/L (ref 20–29)
Calcium: 10.1 mg/dL (ref 8.7–10.2)
Chloride: 99 mmol/L (ref 96–106)
Creatinine, Ser: 0.93 mg/dL (ref 0.76–1.27)
GFR calc Af Amer: 106 mL/min/1.73
GFR calc non Af Amer: 92 mL/min/1.73
Globulin, Total: 3.2 g/dL (ref 1.5–4.5)
Glucose: 85 mg/dL (ref 65–99)
Potassium: 5.3 mmol/L — ABNORMAL HIGH (ref 3.5–5.2)
Sodium: 138 mmol/L (ref 134–144)
Total Protein: 8 g/dL (ref 6.0–8.5)

## 2020-10-31 ENCOUNTER — Other Ambulatory Visit: Payer: Self-pay | Admitting: *Deleted

## 2020-10-31 DIAGNOSIS — G8929 Other chronic pain: Secondary | ICD-10-CM

## 2020-10-31 DIAGNOSIS — M48062 Spinal stenosis, lumbar region with neurogenic claudication: Secondary | ICD-10-CM

## 2020-10-31 NOTE — Telephone Encounter (Signed)
Pt name and DOB verified. He requested-  Hydrocodone and/ or oxycodone for pain.   Medications were prescribed by Dr. Chapman Fitch.    He is also requesting a f/u on lab results for the Lupus panel. The results were pending when he was informed of CMP.   Please advise.

## 2020-10-31 NOTE — Telephone Encounter (Signed)
Copied from [place] [id number]. Topic: General - Inquiry >> Oct 31, 2020 11:00 AM Gillis Ends D wrote: Reason for CRM: Patient made an appointment for medication refills but nothing was available until 12-13-2020. Patient wants to know if he will be able to get his prescription refilled until then. He can be reached at [phone].Please advise

## 2020-11-01 ENCOUNTER — Encounter: Payer: Self-pay | Admitting: Critical Care Medicine

## 2020-11-02 NOTE — Telephone Encounter (Signed)
We do not typically prescribe these medications in the clinic.  I can prescribe tramadol for him but if he declines I am happy to refer him to pain management.

## 2020-11-08 ENCOUNTER — Telehealth: Payer: Self-pay | Admitting: Family Medicine

## 2020-11-08 ENCOUNTER — Telehealth: Payer: Self-pay

## 2020-11-08 DIAGNOSIS — M48062 Spinal stenosis, lumbar region with neurogenic claudication: Secondary | ICD-10-CM

## 2020-11-08 DIAGNOSIS — G8929 Other chronic pain: Secondary | ICD-10-CM

## 2020-11-08 NOTE — Telephone Encounter (Signed)
Requested medication (s) are due for refill today: yes  Requested medication (s) are on the active medication list: yes   Last refill:  09/05/2020  Future visit scheduled: yes   Notes to clinic:  this refill cannot be delegated    Requested Prescriptions  Pending Prescriptions Disp Refills   HYDROcodone-Acetaminophen 5-300 MG TABS 60 tablet 0    Sig: One pill twice per [place] as needed for severe pain      There is no refill protocol information for this order

## 2020-11-08 NOTE — Telephone Encounter (Signed)
Medication Refill - Medication: Hydrocodone   Has the patient contacted their pharmacy? Yes.   (Agent: If no, request that the patient contact the pharmacy for the refill.) (Agent: If yes, when and what did the pharmacy advise?)  Preferred Pharmacy (with phone number or street name):  [id number] [id number] Phone: [phone]: [phone] Hours: Not open 24 hours     Agent: Please be advised that RX refills may take up to 3 business days. We ask that you follow-up with your pharmacy.

## 2020-11-08 NOTE — Telephone Encounter (Signed)
Contacted pt to advise that lupus labs would have to be recollectedper [facility] they were sent to outside lab

## 2020-12-04 NOTE — Progress Notes (Signed)
Wesley [name] - 55 y.o. male MRN [id number]  Date of birth: Aug 13, 1966  Office Visit Note: Visit Date: 10/17/2020 PCP: No primary care provider on file. Referred by: Antony Blackbird, MD  Subjective: Chief Complaint  Patient presents with  . Lower Back - Pain   HPI:  Wesley [name] is a 55 y.o. male who comes in today at the request of Dr. Rodell Perna for planned Left L5-S1 Lumbar epidural steroid injection with fluoroscopic guidance.  The patient has failed conservative care including home exercise, medications, time and activity modification.  This injection will be diagnostic and hopefully therapeutic.  Please see requesting physician notes for further details and justification.   ROS Otherwise per HPI.  Assessment & Plan: Visit Diagnoses:    ICD-10-CM   1. Spinal stenosis of lumbar region, unspecified whether neurogenic claudication present  M48.061 XR C-ARM NO REPORT    Epidural Steroid injection    betamethasone acetate-betamethasone sodium phosphate (CELESTONE) injection 12 mg    Plan: No additional findings.   Meds & Orders:  Meds ordered this encounter  Medications  . betamethasone acetate-betamethasone sodium phosphate (CELESTONE) injection 12 mg    Orders Placed This Encounter  Procedures  . XR C-ARM NO REPORT  . Epidural Steroid injection    Follow-up: Return for visit to requesting physician as needed.   Procedures: No procedures performed  Lumbar Epidural Steroid Injection - Interlaminar Approach with Fluoroscopic Guidance  Patient: Wesley [name]      Date of Birth: [birth year] MRN: [id number] PCP: No primary care provider on file.      Visit Date: 10/17/2020   Universal Protocol:     Consent Given By: the patient  Position: PRONE  Additional Comments: Vital signs were monitored before and after the procedure. Patient was prepped and draped in the usual sterile fashion. The correct patient, procedure, and site was verified.   Injection Procedure  Details:   Procedure diagnoses: Spinal stenosis of lumbar region, unspecified whether neurogenic claudication present [M48.061]   Meds Administered:  Meds ordered this encounter  Medications  . betamethasone acetate-betamethasone sodium phosphate (CELESTONE) injection 12 mg     Laterality: Left  Location/Site:  L5-S1  Needle: 3.5 in., 20 ga. Tuohy  Needle Placement: Paramedian epidural  Findings:   -Comments: Excellent flow of contrast into the epidural space.  Procedure Details: Using a paramedian approach from the side mentioned above, the region overlying the inferior lamina was localized under fluoroscopic visualization and the soft tissues overlying this structure were infiltrated with 4 ml. of 1% Lidocaine without Epinephrine. The Tuohy needle was inserted into the epidural space using a paramedian approach.   The epidural space was localized using loss of resistance along with counter oblique bi-planar fluoroscopic views.  After negative aspirate for air, blood, and CSF, a 2 ml. volume of Isovue-250 was injected into the epidural space and the flow of contrast was observed. Radiographs were obtained for documentation purposes.    The injectate was administered into the level noted above.   Additional Comments:  The patient tolerated the procedure well Dressing: 2 x 2 sterile gauze and Band-Aid    Post-procedure details: Patient was observed during the procedure. Post-procedure instructions were reviewed.  Patient left the clinic in stable condition.     Clinical History: MRI LUMBAR SPINE WITHOUT CONTRAST  TECHNIQUE: Multiplanar, multisequence MR imaging of the lumbar spine was performed. No intravenous contrast was administered.  COMPARISON:  04/10/2019 abdomen and pelvis CT. 04/10/2019 and 12/08/2018 lumbar  spine MRI.  FINDINGS: Segmentation:  Standard.  Alignment:  L4-5 grade 1 retrolisthesis.  Vertebrae: L5-S1 endplate edema and faint disc  edema. Mild edema within L4 pedicles without fracture. No loss of vertebral body height or paravertebral edema. Edema within the superior anterior endplate corners at the L3, L4, and L5 levels.  Conus medullaris and cauda equina: Conus extends to the L1-2 level. Conus and cauda equina appear normal.  Paraspinal and other soft tissues: Negative.  Disc levels:  L1-2: No significant disc displacement, foraminal stenosis, or canal stenosis.  L2-3: Minimal disc bulge. No significant foraminal or spinal canal stenosis.  L3-4: Mild disc bulge. Mild bilateral foraminal stenosis. No significant spinal canal stenosis.  L4-5: Mild disc bulge with facet and ligamentum flavum hypertrophy. Mild foraminal stenosis and spinal canal stenosis.  L5-S1: Moderate disc bulge with endplate marginal osteophytes, facet hypertrophy, and ligamentum flavum hypertrophy. Small central disc extrusion with 9 mm inferior migration. The disc bulge and endplate marginal osteophytes result in moderate to severe bilateral neural foraminal stenosis with disc contact on the exiting L5 nerve roots. The central extrusion narrows the lateral recesses and results in mild spinal canal stenosis.  IMPRESSION: 1. Lumbar spondylosis greatest at L5-S1 were disc and facet degenerative changes result in moderate to severe neural foraminal stenosis and mild spinal canal stenosis. There is disc contact on exiting L5 nerve roots in the neural foramen. 2. Stable L5-S1 endplate edema and faint increased disc signal, likely degenerative. 3. L4 pedicle edema without fracture, likely stress reaction.   Electronically Signed   By: Kristine Garbe M.D.   On: 06/13/2019 19:47     Objective:  VS:  HT:    WT:   BMI:     BP:115/76  HR:89bpm  TEMP: ( )  RESP:  Physical Exam Vitals and nursing note reviewed.  Constitutional:      General: He is not in acute distress.    Appearance: Normal appearance. He  is not ill-appearing.  HENT:     Head: Normocephalic and atraumatic.     Right Ear: External ear normal.     Left Ear: External ear normal.     Nose: No congestion.  Eyes:     Extraocular Movements: Extraocular movements intact.  Cardiovascular:     Rate and Rhythm: Normal rate.     Pulses: Normal pulses.  Pulmonary:     Effort: Pulmonary effort is normal. No respiratory distress.  Abdominal:     General: There is no distension.     Palpations: Abdomen is soft.  Musculoskeletal:        General: No tenderness or signs of injury.     Cervical back: Neck supple.     Right lower leg: No edema.     Left lower leg: No edema.     Comments: Patient has good distal strength without clonus.  Skin:    Findings: No erythema or rash.  Neurological:     General: No focal deficit present.     Mental Status: He is alert and oriented to person, place, and time.     Sensory: No sensory deficit.     Motor: No weakness or abnormal muscle tone.     Coordination: Coordination normal.  Psychiatric:        Mood and Affect: Mood normal.        Behavior: Behavior normal.      Imaging: No results found.

## 2020-12-04 NOTE — Procedures (Signed)
Lumbar Epidural Steroid Injection - Interlaminar Approach with Fluoroscopic Guidance  Patient: Wesley [name]      Date of Birth: 05-Nov-1966 MRN: [id number] PCP: No primary care provider on file.      Visit Date: 10/17/2020   Universal Protocol:     Consent Given By: the patient  Position: PRONE  Additional Comments: Vital signs were monitored before and after the procedure. Patient was prepped and draped in the usual sterile fashion. The correct patient, procedure, and site was verified.   Injection Procedure Details:   Procedure diagnoses: Spinal stenosis of lumbar region, unspecified whether neurogenic claudication present [M48.061]   Meds Administered:  Meds ordered this encounter  Medications  . betamethasone acetate-betamethasone sodium phosphate (CELESTONE) injection 12 mg     Laterality: Left  Location/Site:  L5-S1  Needle: 3.5 in., 20 ga. Tuohy  Needle Placement: Paramedian epidural  Findings:   -Comments: Excellent flow of contrast into the epidural space.  Procedure Details: Using a paramedian approach from the side mentioned above, the region overlying the inferior lamina was localized under fluoroscopic visualization and the soft tissues overlying this structure were infiltrated with 4 ml. of 1% Lidocaine without Epinephrine. The Tuohy needle was inserted into the epidural space using a paramedian approach.   The epidural space was localized using loss of resistance along with counter oblique bi-planar fluoroscopic views.  After negative aspirate for air, blood, and CSF, a 2 ml. volume of Isovue-250 was injected into the epidural space and the flow of contrast was observed. Radiographs were obtained for documentation purposes.    The injectate was administered into the level noted above.   Additional Comments:  The patient tolerated the procedure well Dressing: 2 x 2 sterile gauze and Band-Aid    Post-procedure details: Patient was observed during the  procedure. Post-procedure instructions were reviewed.  Patient left the clinic in stable condition.

## 2020-12-13 ENCOUNTER — Ambulatory Visit: Payer: Self-pay | Attending: Critical Care Medicine | Admitting: Critical Care Medicine

## 2020-12-13 ENCOUNTER — Ambulatory Visit: Payer: Self-pay

## 2020-12-13 ENCOUNTER — Other Ambulatory Visit: Payer: Self-pay | Admitting: Critical Care Medicine

## 2020-12-13 ENCOUNTER — Encounter: Payer: Self-pay | Admitting: Critical Care Medicine

## 2020-12-13 ENCOUNTER — Other Ambulatory Visit: Payer: Self-pay

## 2020-12-13 VITALS — BP 125/84 | HR 88 | Temp 98.5°F | Resp 16 | Wt 162.0 lb

## 2020-12-13 DIAGNOSIS — Z8719 Personal history of other diseases of the digestive system: Secondary | ICD-10-CM

## 2020-12-13 DIAGNOSIS — G8929 Other chronic pain: Secondary | ICD-10-CM

## 2020-12-13 DIAGNOSIS — R110 Nausea: Secondary | ICD-10-CM

## 2020-12-13 DIAGNOSIS — M48062 Spinal stenosis, lumbar region with neurogenic claudication: Secondary | ICD-10-CM | POA: Insufficient documentation

## 2020-12-13 DIAGNOSIS — N401 Enlarged prostate with lower urinary tract symptoms: Secondary | ICD-10-CM | POA: Insufficient documentation

## 2020-12-13 DIAGNOSIS — M792 Neuralgia and neuritis, unspecified: Secondary | ICD-10-CM

## 2020-12-13 DIAGNOSIS — Z23 Encounter for immunization: Secondary | ICD-10-CM

## 2020-12-13 DIAGNOSIS — G894 Chronic pain syndrome: Secondary | ICD-10-CM

## 2020-12-13 DIAGNOSIS — E538 Deficiency of other specified B group vitamins: Secondary | ICD-10-CM

## 2020-12-13 DIAGNOSIS — M5416 Radiculopathy, lumbar region: Secondary | ICD-10-CM

## 2020-12-13 DIAGNOSIS — J449 Chronic obstructive pulmonary disease, unspecified: Secondary | ICD-10-CM

## 2020-12-13 DIAGNOSIS — R7989 Other specified abnormal findings of blood chemistry: Secondary | ICD-10-CM

## 2020-12-13 DIAGNOSIS — M5441 Lumbago with sciatica, right side: Secondary | ICD-10-CM

## 2020-12-13 DIAGNOSIS — G629 Polyneuropathy, unspecified: Secondary | ICD-10-CM

## 2020-12-13 DIAGNOSIS — M5442 Lumbago with sciatica, left side: Secondary | ICD-10-CM

## 2020-12-13 DIAGNOSIS — F0631 Mood disorder due to known physiological condition with depressive features: Secondary | ICD-10-CM

## 2020-12-13 DIAGNOSIS — F17200 Nicotine dependence, unspecified, uncomplicated: Secondary | ICD-10-CM

## 2020-12-13 DIAGNOSIS — F1021 Alcohol dependence, in remission: Secondary | ICD-10-CM

## 2020-12-13 DIAGNOSIS — G2581 Restless legs syndrome: Secondary | ICD-10-CM

## 2020-12-13 DIAGNOSIS — R351 Nocturia: Secondary | ICD-10-CM

## 2020-12-13 DIAGNOSIS — R945 Abnormal results of liver function studies: Secondary | ICD-10-CM

## 2020-12-13 MED ORDER — ONDANSETRON HCL 4 MG PO TABS: 4.0000 mg | ORAL_TABLET | Freq: Three times a day (TID) | ORAL | 3 refills | Status: DC | PRN

## 2020-12-13 MED ORDER — DULOXETINE HCL 60 MG PO CPEP: 60.0000 mg | ORAL_CAPSULE | Freq: Every day | ORAL | 3 refills | Status: DC

## 2020-12-13 MED ORDER — PANTOPRAZOLE SODIUM 40 MG PO TBEC: 40.0000 mg | DELAYED_RELEASE_TABLET | Freq: Every day | ORAL | 3 refills | Status: DC

## 2020-12-13 MED ORDER — NAPROXEN 500 MG PO TABS: 500.0000 mg | ORAL_TABLET | Freq: Two times a day (BID) | ORAL | 2 refills | Status: DC | PRN

## 2020-12-13 MED ORDER — PREGABALIN 150 MG PO CAPS: 150.0000 mg | ORAL_CAPSULE | Freq: Two times a day (BID) | ORAL | 1 refills | Status: DC

## 2020-12-13 MED ORDER — TAMSULOSIN HCL 0.4 MG PO CAPS: 0.4000 mg | ORAL_CAPSULE | Freq: Every day | ORAL | 3 refills | Status: DC

## 2020-12-13 MED ORDER — TIZANIDINE HCL 4 MG PO TABS: ORAL_TABLET | ORAL | 4 refills | Status: DC

## 2020-12-13 MED ORDER — THIAMINE HCL 100 MG PO TABS: 100.0000 mg | ORAL_TABLET | Freq: Every day | ORAL | 6 refills | Status: DC

## 2020-12-13 MED ORDER — ROPINIROLE HCL 1 MG PO TABS: 1.0000 mg | ORAL_TABLET | Freq: Every day | ORAL | 2 refills | Status: DC

## 2020-12-13 MED ORDER — FOLIC ACID 1 MG PO TABS: 1.0000 mg | ORAL_TABLET | Freq: Every day | ORAL | 3 refills | Status: DC

## 2020-12-13 MED ORDER — VITAMIN B-12 1000 MCG PO TABS: 1000.0000 ug | ORAL_TABLET | Freq: Every day | ORAL | 6 refills | Status: DC

## 2020-12-13 MED FILL — tiZANidine HCL 4 MG TABS: 4 | 30 days supply | Qty: 60 | Fill #0

## 2020-12-13 MED FILL — NAPROXEN 500 MG TABLET: 500 | 30 days supply | Qty: 60 | Fill #0

## 2020-12-13 MED FILL — rOPINIRole HCL 1 MG TABS: 1 | 30 days supply | Qty: 30 | Fill #0

## 2020-12-13 MED FILL — ?DULOXetine HCL 60MG CAPS: 60 | 30 days supply | Qty: 30 | Fill #0

## 2020-12-13 MED FILL — PREGABALIN 150 MG CAPS: 150 | 30 days supply | Qty: 60 | Fill #0

## 2020-12-13 MED FILL — PANTOPRAZOLE SOD DR 40 MG T: 40 | 30 days supply | Qty: 30 | Fill #0

## 2020-12-13 MED FILL — TAMSULOSIN HCL 0.4 MG CAP: 0.4 | 30 days supply | Qty: 30 | Fill #0

## 2020-12-13 MED FILL — ONDANSETRON HCL 4 MG TABLET: 4 | 6 days supply | Qty: 20 | Fill #0

## 2020-12-13 NOTE — Progress Notes (Signed)
Left leg and back pain - injection last month did not help with back pain  Has near falls at home  -Would like a refill on pain medicatoin Concerns with hair falling out. Had to cut off his hair.  Has not received lab test for lupus.

## 2020-12-13 NOTE — Assessment & Plan Note (Signed)
Referral to orthopedics made in physical therapy

## 2020-12-13 NOTE — Assessment & Plan Note (Signed)
Marland Kitchen  Current smoking consumption amount: 1 pack a [place]  . Dicsussion on advise to quit smoking and smoking impacts: Impacts on lung health  . Patient's willingness to quit: Uncertain  . Methods to quit smoking discussed: Behavioral modification nicotine replacement  . Medication management of smoking session drugs discussed:  . Resources provided:  AVS   . Setting quit date not established  . Follow-up arranged 2 months   Time spent counseling the patient: 5 minutes

## 2020-12-13 NOTE — Progress Notes (Signed)
Subjective:    Patient ID: Wesley [name], male    DOB: 1966/10/05, 55 y.o.   MRN: [id number]  55 y.o.M here to est PCP   COPD, neuropathy, smoker , ETOH, chronic pain  12/13/2020 this is a former patient of Dr. Chapman Fitch comes in today to reestablish care having not been seen in this clinic since 2020.  Patient has a prior history of COPD with spontaneous pneumothorax from an emphysematous bleb, history of active smoking severe alcohol use elevated liver functions chronic hepatitis C with treatment pancreatitis recurrent malnutrition  Patient states he has not been drinking in several years he has had prior history of lumbar disc disease and neck disease.  He is formally been followed by neurosurgery and then more recently by Ortho care.  He is due a COVID booster.  He was seen in the hepatitis C clinic and is finished his course of therapy.  He still smokes 1 pack a [place] of cigarettes.  He was formally on opiates per Dr. Chapman Fitch but is now off.  He states he has a needle sensation in his feet up to his knees bilaterally.  He was on vitamin supplementation but is off this medication at this time.  He is not on any medicines at all as he is run out all of his medications and needing refills  On arrival blood pressure is 125/84  Patient seen by neurology in November of last year found to have neurogenic claudication felt to be probably factorial including lumbar radiculopathy history of alcohol use and vitamin deficiency  Past Medical History:  Diagnosis Date  . Acute pancreatitis   . Alcohol abuse   . Cholecystitis 01/2018  . Chronic hepatitis C ([place])   . EMPHYSEMATOUS BLEB 08/07/2010   Qualifier: Diagnosis of  By: [name] MD, [name]   . History of blood transfusion   . Neuropathy   . Radial nerve compression    right  . Spinal stenosis, lumbar   . SPONTANEOUS PNEUMOTHORAX 08/07/2010   Qualifier: History of  By: Tilden Dome       Family History  Problem Relation Age of Onset  . Hypertension  Mother   . Cirrhosis Father 21  . Kidney cancer Father   . Multiple sclerosis Sister   . Liver cancer Maternal Grandmother   . Colon cancer Cousin 13       Mother's niece  . Esophageal cancer Neg Hx   . Stomach cancer Neg Hx   . Rectal cancer Neg Hx      Social History   Socioeconomic History  . Marital status: Single    Spouse name: Not on file  . Number of children: 1  . Years of education: 66  . Highest education level: Not on file  Occupational History  . Occupation: unemployed - fired for drinking on the job    Comment: unemployed  Tobacco Use  . Smoking status: Current Every Gates Smoker    Packs/[place]: 0.75    Years: 42.00    Pack years: 31.50    Types: Cigarettes  . Smokeless tobacco: Never Used  Vaping Use  . Vaping Use: Never used  Substance and Sexual Activity  . Alcohol use: Not Currently    Alcohol/week: 25.0 standard drinks    Types: 25 Standard drinks or equivalent per week    Comment: stopped 04/2019  . Drug use: No  . Sexual activity: Yes    Partners: Female    Birth control/protection: None  Other Topics  Concern  . Not on file  Social History Narrative   Left handed   Lives in a single story home with fiance.   Caffeine- sodas, 7 -8 cans   Social Determinants of Health   Financial Resource Strain: Not on file  Food Insecurity: Not on file  Transportation Needs: Not on file  Physical Activity: Not on file  Stress: Not on file  Social Connections: Not on file  Intimate Partner Violence: Not on file     No Known Allergies   Outpatient Medications Prior to Visit  Medication Sig Dispense Refill  . Multiple Vitamin (MULTIVITAMIN) tablet Take 1 tablet by mouth daily.    . DULoxetine (CYMBALTA) 60 MG capsule Take 1 capsule (60 mg total) by mouth daily. 30 capsule 3  . folic acid (FOLVITE) 1 MG tablet Take 1 tablet (1 mg total) by mouth daily. 90 tablet 3  . naproxen (NAPROSYN) 500 MG tablet Take 1 tablet (500 mg total) by mouth 2 (two) times  daily as needed for moderate pain. 60 tablet 2  . ondansetron (ZOFRAN) 4 MG tablet Take 1 tablet (4 mg total) by mouth every 8 (eight) hours as needed for nausea or vomiting. 20 tablet 3  . pantoprazole (PROTONIX) 40 MG tablet Take 1 tablet (40 mg total) by mouth daily. 30 tablet 3  . pregabalin (LYRICA) 150 MG capsule Take 1 capsule (150 mg total) by mouth 2 (two) times daily. For nerve pain 180 capsule 1  . rOPINIRole (REQUIP) 1 MG tablet Take 1 tablet (1 mg total) by mouth at bedtime. 30 tablet 2  . tamsulosin (FLOMAX) 0.4 MG CAPS capsule Take 1 capsule (0.4 mg total) by mouth daily. 30 capsule 3  . thiamine 100 MG tablet Take 1 tablet (100 mg total) by mouth daily. 30 tablet 6  . tiZANidine (ZANAFLEX) 4 MG tablet One or two pills at bedtime as needed for muscle spasm 60 tablet 4  . vitamin B-12 (CYANOCOBALAMIN) 1000 MCG tablet Take 1 tablet (1,000 mcg total) by mouth daily. Due to vitamin B12 deficiency 30 tablet 6  . hydrocortisone cream 1 % Apply to affected area 2 times daily (Patient not taking: Reported on 12/13/2020) 15 g 0  . Multiple Vitamins-Minerals (ZINC PO) Take by mouth daily. (Patient not taking: Reported on 12/13/2020)    . triamcinolone ointment (KENALOG) 0.1 % Apply 1 application topically daily. (Patient not taking: Reported on 12/13/2020)    . griseofulvin (GRIFULVIN V) 500 MG tablet Take 1 tablet (500 mg total) by mouth daily. Along with 250 mg tablet (750 mg total) (Patient not taking: Reported on 12/13/2020) 30 tablet 1  . griseofulvin (GRIS-PEG) 250 MG tablet Take 1 tablet (250 mg total) by mouth daily. Along with 500 mg to make 750 mg daily (Patient not taking: Reported on 12/13/2020) 30 tablet 1  . HYDROcodone-Acetaminophen 5-300 MG TABS One pill twice per [place] as needed for severe pain (Patient not taking: Reported on 12/13/2020) 60 tablet 0   No facility-administered medications prior to visit.      Review of Systems  Constitutional: Negative for fever.  HENT: Negative.    Respiratory: Positive for shortness of breath. Negative for cough.   Musculoskeletal: Positive for back pain and gait problem.  Skin:       itch       Objective:   Physical Exam Vitals:   12/13/20 0853  BP: 125/84  Pulse: 88  Resp: 16  Temp: 98.5 F (36.9 C)  TempSrc: Oral  SpO2: 95%  Weight: 162 lb (73.5 kg)    Gen: Pleasant, thin in no distress,  normal affect  ENT: No lesions,  mouth clear,  oropharynx clear, no postnasal drip  Neck: No JVD, no TMG, no carotid bruits  Lungs: No use of accessory muscles, no dullness to percussion,  distant breath sounds   Cardiovascular: RRR, heart sounds normal, no murmur or gallops, no peripheral edema  Abdomen: soft and NT, no HSM,  BS normal  Musculoskeletal: No deformities, no cyanosis or clubbing  Neuro: alert, non focal  Skin: Warm, no lesions or rashes  No results found.        Assessment & Plan:  I personally reviewed all images and lab data in the [facility] system as well as any outside material available during this office visit and agree with the  radiology impressions.   COPD with chronic bronchitis (HCC) Chronic obstructive lung disease with emphysematous change history of spontaneous pneumothorax now resolved  Observe for now off medications  Focus on smoking cessation  Abnormal LFTs History of abnormal liver functions and alcohol use also cholecystitis  Follow-up liver functions  Alcohol dependence in remission ([place]) History of alcohol use now in remission  Chronic pain syndrome History of chronic pain previously managed by physical medicine and rehab now off all opiates patient seeking additional pain management  Will refer to orthopedics for reassessment of the lumbar spine will refer to pain management as I told the patient we cannot prescribe chronic opiates out of this clinic I have refilled the patient's muscle relaxants and nonsteroidals and I referred the patient to physical  therapy  SMOKER    . Current smoking consumption amount: 1 pack a Tiu  . Dicsussion on advise to quit smoking and smoking impacts: Impacts on lung health  . Patient's willingness to quit: Uncertain  . Methods to quit smoking discussed: Behavioral modification nicotine replacement  . Medication management of smoking session drugs discussed:  . Resources provided:  AVS   . Setting quit date not established  . Follow-up arranged 2 months   Time spent counseling the patient: 5 minutes   Nocturia associated with benign prostatic hyperplasia Refill Flomax  Neurogenic claudication due to lumbar spinal stenosis Referral to orthopedics made in physical therapy   Coleson was seen today for medication refill.  Diagnoses and all orders for this visit:  Neurogenic claudication due to lumbar spinal stenosis -     DULoxetine (CYMBALTA) 60 MG capsule; Take 1 capsule (60 mg total) by mouth daily. -     naproxen (NAPROSYN) 500 MG tablet; Take 1 tablet (500 mg total) by mouth 2 (two) times daily as needed for moderate pain. -     Discontinue: pregabalin (LYRICA) 150 MG capsule; Take 1 capsule (150 mg total) by mouth 2 (two) times daily. For nerve pain -     tiZANidine (ZANAFLEX) 4 MG tablet; One or two pills at bedtime as needed for muscle spasm -     pregabalin (LYRICA) 150 MG capsule; Take 1 capsule (150 mg total) by mouth 2 (two) times daily. For nerve pain  Lumbar radiculopathy -     Ambulatory referral to Orthopedic Surgery -     Ambulatory referral to Physical Therapy -     Ambulatory referral to Pain Clinic  Chronic pain syndrome -     Ambulatory referral to Pain Clinic  Depression due to physical illness -     DULoxetine (CYMBALTA) 60 MG capsule; Take 1 capsule (60 mg  total) by mouth daily.  Chronic midline low back pain with bilateral sciatica -     naproxen (NAPROSYN) 500 MG tablet; Take 1 tablet (500 mg total) by mouth 2 (two) times daily as needed for moderate pain. -      tiZANidine (ZANAFLEX) 4 MG tablet; One or two pills at bedtime as needed for muscle spasm  Nausea -     ondansetron (ZOFRAN) 4 MG tablet; Take 1 tablet (4 mg total) by mouth every 8 (eight) hours as needed for nausea or vomiting. -     pantoprazole (PROTONIX) 40 MG tablet; Take 1 tablet (40 mg total) by mouth daily.  Neuropathic pain -     Discontinue: pregabalin (LYRICA) 150 MG capsule; Take 1 capsule (150 mg total) by mouth 2 (two) times daily. For nerve pain -     pregabalin (LYRICA) 150 MG capsule; Take 1 capsule (150 mg total) by mouth 2 (two) times daily. For nerve pain  Neuropathy -     rOPINIRole (REQUIP) 1 MG tablet; Take 1 tablet (1 mg total) by mouth at bedtime.  Restless leg syndrome -     rOPINIRole (REQUIP) 1 MG tablet; Take 1 tablet (1 mg total) by mouth at bedtime.  Nocturia associated with benign prostatic hyperplasia -     tamsulosin (FLOMAX) 0.4 MG CAPS capsule; Take 1 capsule (0.4 mg total) by mouth daily.  History of pancreatitis -     thiamine 100 MG tablet; Take 1 tablet (100 mg total) by mouth daily.  Vitamin B12 deficiency -     vitamin B-12 (CYANOCOBALAMIN) 1000 MCG tablet; Take 1 tablet (1,000 mcg total) by mouth daily. Due to vitamin B12 deficiency  Need for immunization against influenza -     Flu Vaccine QUAD 36+ mos IM  COPD with chronic bronchitis (HCC)  Abnormal LFTs  Alcohol dependence in remission ([place])  SMOKER  Other orders -     folic acid (FOLVITE) 1 MG tablet; Take 1 tablet (1 mg total) by mouth daily.   Flu vaccine given this visit

## 2020-12-13 NOTE — Assessment & Plan Note (Signed)
History of abnormal liver functions and alcohol use also cholecystitis  Follow-up liver functions

## 2020-12-13 NOTE — Patient Instructions (Signed)
Referrals made to orthopedic surgery pain management and physical therapy for your back  Refills on all your medications sent to our pharmacy a printed prescription for your Lyrica was made so that we can have the mail order pharmacy obtain refills through the patient assistance program you are on  Return to see Dr. Joya Gaskins in 2 months  Focus on smoking cessation as we discussed  Your hepatitis C is stable this does not need follow-up  Please obtain a COVID booster see number below we can find one COVID-19 Vaccine Information can be found at: ShippingScam.co.uk For questions related to vaccine distribution or appointments, please email [email or web address] or call [phone].

## 2020-12-13 NOTE — Assessment & Plan Note (Signed)
Refill Flomax

## 2020-12-13 NOTE — Assessment & Plan Note (Signed)
History of alcohol use now in remission

## 2020-12-13 NOTE — Assessment & Plan Note (Signed)
Chronic obstructive lung disease with emphysematous change history of spontaneous pneumothorax now resolved  Observe for now off medications  Focus on smoking cessation

## 2020-12-13 NOTE — Assessment & Plan Note (Signed)
History of chronic pain previously managed by physical medicine and rehab now off all opiates patient seeking additional pain management  Will refer to orthopedics for reassessment of the lumbar spine will refer to pain management as I told the patient we cannot prescribe chronic opiates out of this clinic I have refilled the patient's muscle relaxants and nonsteroidals and I referred the patient to physical therapy

## 2020-12-18 ENCOUNTER — Telehealth: Payer: Self-pay | Admitting: Critical Care Medicine

## 2020-12-18 NOTE — Telephone Encounter (Signed)
Copied from Painter [id number]. Topic: General - Inquiry >> Dec 18, 2020 11:49 AM Gillis Ends D wrote: Reason for CRM: Needs a new referral, because [facility] doesn't except the orange card. So he will need a new referral. He can be reached at [phone]. Please advise

## 2020-12-19 ENCOUNTER — Ambulatory Visit (INDEPENDENT_AMBULATORY_CARE_PROVIDER_SITE_OTHER): Payer: Self-pay | Admitting: Orthopaedic Surgery

## 2020-12-19 ENCOUNTER — Other Ambulatory Visit: Payer: Self-pay

## 2020-12-19 VITALS — BP 135/82 | HR 82 | Ht 68.0 in | Wt 162.0 lb

## 2020-12-19 DIAGNOSIS — M545 Low back pain, unspecified: Secondary | ICD-10-CM

## 2020-12-19 DIAGNOSIS — G8929 Other chronic pain: Secondary | ICD-10-CM

## 2020-12-19 NOTE — Telephone Encounter (Signed)
Dr Joya Gaskins  the office  of Dr Cyril Mourning [facility] AND [facility]  but patient still to pay $150 .   We don't have nothing with [employer]

## 2020-12-19 NOTE — Telephone Encounter (Signed)
I am not sure there are any pain clinics that accept orange card unfortunately.   Wesley Sierras do you have any ideas

## 2020-12-20 NOTE — Telephone Encounter (Signed)
No pain clinic takes the orange card.  Spoke to Wesley [name]   He is working with dr [name] who ordered another MRI of lumbar.  Wesley [name] to f/u with Dr [name] for any potential non opioid rx plan for his back
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
## 2020-12-25 ENCOUNTER — Ambulatory Visit: Payer: Medicaid Other | Admitting: Orthopaedic Surgery

## 2020-12-28 ENCOUNTER — Other Ambulatory Visit: Payer: Self-pay

## 2020-12-28 ENCOUNTER — Ambulatory Visit: Payer: Self-pay

## 2020-12-29 ENCOUNTER — Ambulatory Visit (HOSPITAL_COMMUNITY)
Admission: RE | Admit: 2020-12-29 | Discharge: 2020-12-29 | Disposition: A | Discharge status: Home / Self Care | Payer: Self-pay | Source: Ambulatory Visit | Attending: Orthopaedic Surgery | Admitting: Orthopaedic Surgery

## 2020-12-29 DIAGNOSIS — G8929 Other chronic pain: Secondary | ICD-10-CM | POA: Insufficient documentation

## 2020-12-29 DIAGNOSIS — M545 Low back pain, unspecified: Secondary | ICD-10-CM | POA: Insufficient documentation

## 2020-12-30 ENCOUNTER — Emergency Department (HOSPITAL_COMMUNITY): Payer: Self-pay

## 2020-12-30 ENCOUNTER — Inpatient Hospital Stay (HOSPITAL_COMMUNITY)
Admission: EM | Admit: 2020-12-30 | Discharge: 2021-01-08 | DRG: 438 | Disposition: A | Discharge status: Home / Self Care | Payer: Self-pay | Attending: Internal Medicine | Admitting: Internal Medicine

## 2020-12-30 ENCOUNTER — Encounter (HOSPITAL_COMMUNITY): Payer: Self-pay | Admitting: Emergency Medicine

## 2020-12-30 ENCOUNTER — Other Ambulatory Visit: Payer: Self-pay

## 2020-12-30 DIAGNOSIS — Z8249 Family history of ischemic heart disease and other diseases of the circulatory system: Secondary | ICD-10-CM

## 2020-12-30 DIAGNOSIS — E162 Hypoglycemia, unspecified: Secondary | ICD-10-CM | POA: Diagnosis not present

## 2020-12-30 DIAGNOSIS — J189 Pneumonia, unspecified organism: Secondary | ICD-10-CM

## 2020-12-30 DIAGNOSIS — E871 Hypo-osmolality and hyponatremia: Secondary | ICD-10-CM | POA: Diagnosis not present

## 2020-12-30 DIAGNOSIS — G8929 Other chronic pain: Secondary | ICD-10-CM

## 2020-12-30 DIAGNOSIS — K859 Acute pancreatitis without necrosis or infection, unspecified: Principal | ICD-10-CM | POA: Diagnosis present

## 2020-12-30 DIAGNOSIS — K567 Ileus, unspecified: Secondary | ICD-10-CM | POA: Diagnosis not present

## 2020-12-30 DIAGNOSIS — Z79899 Other long term (current) drug therapy: Secondary | ICD-10-CM

## 2020-12-30 DIAGNOSIS — K861 Other chronic pancreatitis: Secondary | ICD-10-CM | POA: Diagnosis present

## 2020-12-30 DIAGNOSIS — F1021 Alcohol dependence, in remission: Secondary | ICD-10-CM | POA: Diagnosis present

## 2020-12-30 DIAGNOSIS — K5900 Constipation, unspecified: Secondary | ICD-10-CM

## 2020-12-30 DIAGNOSIS — M5442 Lumbago with sciatica, left side: Secondary | ICD-10-CM

## 2020-12-30 DIAGNOSIS — J449 Chronic obstructive pulmonary disease, unspecified: Secondary | ICD-10-CM

## 2020-12-30 DIAGNOSIS — Z20822 Contact with and (suspected) exposure to covid-19: Secondary | ICD-10-CM | POA: Diagnosis present

## 2020-12-30 DIAGNOSIS — Z8051 Family history of malignant neoplasm of kidney: Secondary | ICD-10-CM

## 2020-12-30 DIAGNOSIS — E876 Hypokalemia: Secondary | ICD-10-CM | POA: Diagnosis not present

## 2020-12-30 DIAGNOSIS — J4489 Other specified chronic obstructive pulmonary disease: Secondary | ICD-10-CM | POA: Diagnosis present

## 2020-12-30 DIAGNOSIS — Z800 Family history of malignant neoplasm of digestive organs: Secondary | ICD-10-CM

## 2020-12-30 DIAGNOSIS — J690 Pneumonitis due to inhalation of food and vomit: Secondary | ICD-10-CM | POA: Diagnosis not present

## 2020-12-30 DIAGNOSIS — F17210 Nicotine dependence, cigarettes, uncomplicated: Secondary | ICD-10-CM | POA: Diagnosis present

## 2020-12-30 DIAGNOSIS — B182 Chronic viral hepatitis C: Secondary | ICD-10-CM | POA: Diagnosis present

## 2020-12-30 DIAGNOSIS — Z820 Family history of epilepsy and other diseases of the nervous system: Secondary | ICD-10-CM

## 2020-12-30 DIAGNOSIS — R109 Unspecified abdominal pain: Secondary | ICD-10-CM

## 2020-12-30 DIAGNOSIS — M5441 Lumbago with sciatica, right side: Secondary | ICD-10-CM

## 2020-12-30 LAB — CBC
HCT: 49.5 % (ref 39.0–52.0)
Hemoglobin: 17.3 g/dL — ABNORMAL HIGH (ref 13.0–17.0)
MCH: 31.9 pg (ref 26.0–34.0)
MCHC: 34.9 g/dL (ref 30.0–36.0)
MCV: 91.3 fL (ref 80.0–100.0)
Platelets: 321 10*3/uL (ref 150–400)
RBC: 5.42 MIL/uL (ref 4.22–5.81)
RDW: 13.1 % (ref 11.5–15.5)
WBC: 10 10*3/uL (ref 4.0–10.5)
nRBC: 0 % (ref 0.0–0.2)

## 2020-12-30 LAB — URINALYSIS, ROUTINE W REFLEX MICROSCOPIC
Bilirubin Urine: NEGATIVE
Glucose, UA: NEGATIVE mg/dL
Hgb urine dipstick: NEGATIVE
Ketones, ur: NEGATIVE mg/dL
Leukocytes,Ua: NEGATIVE
Nitrite: NEGATIVE
Protein, ur: NEGATIVE mg/dL
Specific Gravity, Urine: 1.021 (ref 1.005–1.030)
pH: 5 (ref 5.0–8.0)

## 2020-12-30 LAB — COMPREHENSIVE METABOLIC PANEL
ALT: 19 U/L (ref 0–44)
AST: 25 U/L (ref 15–41)
Albumin: 3.9 g/dL (ref 3.5–5.0)
Alkaline Phosphatase: 77 U/L (ref 38–126)
Anion gap: 12 (ref 5–15)
BUN: 10 mg/dL (ref 6–20)
CO2: 22 mmol/L (ref 22–32)
Calcium: 9.1 mg/dL (ref 8.9–10.3)
Chloride: 101 mmol/L (ref 98–111)
Creatinine, Ser: 1.15 mg/dL (ref 0.61–1.24)
GFR, Estimated: >60 mL/min (ref >60)
Glucose, Bld: 102 mg/dL — ABNORMAL HIGH (ref 70–99)
Potassium: 3.8 mmol/L (ref 3.5–5.1)
Sodium: 135 mmol/L (ref 135–145)
Total Bilirubin: 1.1 mg/dL (ref 0.3–1.2)
Total Protein: 7.9 g/dL (ref 6.5–8.1)

## 2020-12-30 LAB — LIPASE, BLOOD: Lipase: 87 U/L — ABNORMAL HIGH (ref 11–51)

## 2020-12-30 MED ORDER — SODIUM CHLORIDE 0.9 % IV BOLUS
1000.0000 mL | Freq: Once | INTRAVENOUS | Status: AC
  Administered 2020-12-30: 1000 mL via INTRAVENOUS

## 2020-12-30 MED ORDER — ACETAMINOPHEN 650 MG RE SUPP
650.0000 mg | Freq: Four times a day (QID) | RECTAL | Status: DC | PRN
  Administered 2021-01-01: 650 mg via RECTAL
  Filled 2020-12-30: qty 1

## 2020-12-30 MED ORDER — ONDANSETRON HCL 4 MG PO TABS: 4.0000 mg | ORAL_TABLET | Freq: Four times a day (QID) | ORAL | Status: DC | PRN

## 2020-12-30 MED ORDER — ONDANSETRON HCL 4 MG/2ML IJ SOLN
4.0000 mg | Freq: Once | INTRAMUSCULAR | Status: AC
  Administered 2020-12-30: 4 mg via INTRAVENOUS
  Filled 2020-12-30: qty 2

## 2020-12-30 MED ORDER — FAMOTIDINE IN NACL 20-0.9 MG/50ML-% IV SOLN
20.0000 mg | Freq: Two times a day (BID) | INTRAVENOUS | Status: DC
  Administered 2020-12-30 – 2021-01-08 (×18): 20 mg via INTRAVENOUS
  Filled 2020-12-30 (×20): qty 50

## 2020-12-30 MED ORDER — ACETAMINOPHEN 325 MG PO TABS
650.0000 mg | ORAL_TABLET | Freq: Four times a day (QID) | ORAL | Status: DC | PRN
  Administered 2020-12-31 – 2021-01-04 (×3): 650 mg via ORAL
  Filled 2020-12-30 (×4): qty 2

## 2020-12-30 MED ORDER — ONDANSETRON HCL 4 MG/2ML IJ SOLN
4.0000 mg | Freq: Four times a day (QID) | INTRAMUSCULAR | Status: DC | PRN
  Administered 2020-12-31 – 2021-01-08 (×6): 4 mg via INTRAVENOUS
  Filled 2020-12-30 (×6): qty 2

## 2020-12-30 MED ORDER — SODIUM CHLORIDE 0.9 % IV SOLN: INTRAVENOUS | Status: DC

## 2020-12-30 MED ORDER — ALBUTEROL SULFATE HFA 108 (90 BASE) MCG/ACT IN AERS
2.0000 | INHALATION_SPRAY | RESPIRATORY_TRACT | Status: DC | PRN
  Filled 2020-12-30: qty 6.7

## 2020-12-30 MED ORDER — HYDROMORPHONE HCL 1 MG/ML IJ SOLN: 1.0000 mg | Freq: Once | INTRAMUSCULAR | Status: DC

## 2020-12-30 MED ORDER — HYDROMORPHONE HCL 1 MG/ML IJ SOLN
0.5000 mg | INTRAMUSCULAR | Status: DC | PRN
  Administered 2020-12-30 – 2020-12-31 (×3): 1 mg via INTRAVENOUS
  Filled 2020-12-30 (×3): qty 1

## 2020-12-30 MED ORDER — ENOXAPARIN SODIUM 40 MG/0.4ML ~~LOC~~ SOLN
40.0000 mg | SUBCUTANEOUS | Status: DC
  Administered 2020-12-30 – 2021-01-07 (×9): 40 mg via SUBCUTANEOUS
  Filled 2020-12-30 (×9): qty 0.4

## 2020-12-30 MED ORDER — FENTANYL CITRATE (PF) 100 MCG/2ML IJ SOLN
50.0000 ug | Freq: Once | INTRAMUSCULAR | Status: AC
  Administered 2020-12-30: 50 ug via INTRAVENOUS
  Filled 2020-12-30: qty 2

## 2020-12-30 MED ORDER — IOHEXOL 300 MG/ML  SOLN
100.0000 mL | Freq: Once | INTRAMUSCULAR | Status: AC | PRN
  Administered 2020-12-30: 100 mL via INTRAVENOUS

## 2020-12-30 NOTE — ED Notes (Signed)
Pt transported to [place] at this time.

## 2020-12-30 NOTE — ED Triage Notes (Signed)
Pt reports generalized abd pain and back pain x 2 days.  Pain radiating down L leg since yesterday.  Had outpatient MRI for back yesterday.   Reports nausea.  Denies vomiting and diarrhea.
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
## 2020-12-31 LAB — HIV ANTIBODY (ROUTINE TESTING W REFLEX): HIV Screen 4th Generation wRfx: NONREACTIVE

## 2020-12-31 LAB — CBC
HCT: 46.8 % (ref 39.0–52.0)
Hemoglobin: 15.5 g/dL (ref 13.0–17.0)
MCH: 31.6 pg (ref 26.0–34.0)
MCHC: 33.1 g/dL (ref 30.0–36.0)
MCV: 95.5 fL (ref 80.0–100.0)
Platelets: 246 10*3/uL (ref 150–400)
RBC: 4.9 MIL/uL (ref 4.22–5.81)
RDW: 13.1 % (ref 11.5–15.5)
WBC: 10 10*3/uL (ref 4.0–10.5)
nRBC: 0 % (ref 0.0–0.2)

## 2020-12-31 LAB — TRIGLYCERIDES: Triglycerides: 66 mg/dL (ref <150)

## 2020-12-31 LAB — COMPREHENSIVE METABOLIC PANEL
ALT: 17 U/L (ref 0–44)
AST: 24 U/L (ref 15–41)
Albumin: 3.2 g/dL — ABNORMAL LOW (ref 3.5–5.0)
Alkaline Phosphatase: 61 U/L (ref 38–126)
Anion gap: 10 (ref 5–15)
BUN: 8 mg/dL (ref 6–20)
CO2: 19 mmol/L — ABNORMAL LOW (ref 22–32)
Calcium: 8.1 mg/dL — ABNORMAL LOW (ref 8.9–10.3)
Chloride: 107 mmol/L (ref 98–111)
Creatinine, Ser: 1 mg/dL (ref 0.61–1.24)
GFR, Estimated: >60 mL/min (ref >60)
Glucose, Bld: 96 mg/dL (ref 70–99)
Potassium: 3.7 mmol/L (ref 3.5–5.1)
Sodium: 136 mmol/L (ref 135–145)
Total Bilirubin: 1.3 mg/dL — ABNORMAL HIGH (ref 0.3–1.2)
Total Protein: 6.3 g/dL — ABNORMAL LOW (ref 6.5–8.1)

## 2020-12-31 LAB — SARS CORONAVIRUS 2 (TAT 6-24 HRS): SARS Coronavirus 2: NEGATIVE

## 2020-12-31 MED ORDER — PANTOPRAZOLE SODIUM 40 MG IV SOLR
40.0000 mg | Freq: Two times a day (BID) | INTRAVENOUS | Status: DC
  Administered 2020-12-31 – 2021-01-08 (×17): 40 mg via INTRAVENOUS
  Filled 2020-12-31 (×17): qty 40

## 2020-12-31 MED ORDER — KETOROLAC TROMETHAMINE 15 MG/ML IJ SOLN
7.5000 mg | Freq: Four times a day (QID) | INTRAMUSCULAR | Status: AC | PRN
  Administered 2020-12-31 – 2021-01-05 (×9): 7.5 mg via INTRAVENOUS
  Filled 2020-12-31 (×9): qty 1

## 2020-12-31 MED ORDER — PROMETHAZINE HCL 25 MG/ML IJ SOLN
6.2500 mg | Freq: Four times a day (QID) | INTRAMUSCULAR | Status: DC | PRN
  Administered 2021-01-01 (×2): 6.25 mg via INTRAVENOUS
  Filled 2020-12-31 (×2): qty 1

## 2020-12-31 MED ORDER — HYDROMORPHONE HCL 1 MG/ML IJ SOLN: 1.0000 mg | INTRAMUSCULAR | Status: DC | PRN

## 2020-12-31 MED ORDER — HYDROMORPHONE HCL 1 MG/ML IJ SOLN
0.5000 mg | INTRAMUSCULAR | Status: DC | PRN
Start: 2020-12-31 — End: 2021-01-03
  Administered 2020-12-31 – 2021-01-03 (×11): 1 mg via INTRAVENOUS
  Filled 2020-12-31 (×11): qty 1

## 2020-12-31 MED ORDER — SODIUM CHLORIDE 0.9 % IV SOLN: INTRAVENOUS | Status: DC

## 2020-12-31 NOTE — ED Notes (Addendum)
Patient care assumed. He is alert, oriented, and follows commands. Shallow breathing noted. Asked patient to take deep breath as O2 sats are lower at this time (87-88% on room air). Patient grabbed at his right flank and moaned. Patient placed on nasal cannula and tolerated well. Sats up to mid 90's at that time. Will continue to monitor pain level and patient comfort.

## 2020-12-31 NOTE — ED Notes (Signed)
Sitting at edge of bottom of stretcher with severe nausea. Patient's nausea and pain PRN's given, but no relief. Provider messaged for additional medication. Cold compress on neck. Emesis bag in hand. Lights dimmed. Will cont to monitor and support
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
## 2021-01-01 ENCOUNTER — Inpatient Hospital Stay (HOSPITAL_COMMUNITY): Payer: Self-pay

## 2021-01-01 LAB — COMPREHENSIVE METABOLIC PANEL
ALT: 18 U/L (ref 0–44)
AST: 30 U/L (ref 15–41)
Albumin: 3.1 g/dL — ABNORMAL LOW (ref 3.5–5.0)
Alkaline Phosphatase: 58 U/L (ref 38–126)
Anion gap: 12 (ref 5–15)
BUN: 7 mg/dL (ref 6–20)
CO2: 19 mmol/L — ABNORMAL LOW (ref 22–32)
Calcium: 8.3 mg/dL — ABNORMAL LOW (ref 8.9–10.3)
Chloride: 104 mmol/L (ref 98–111)
Creatinine, Ser: 0.97 mg/dL (ref 0.61–1.24)
GFR, Estimated: >60 mL/min (ref >60)
Glucose, Bld: 64 mg/dL — ABNORMAL LOW (ref 70–99)
Potassium: 4 mmol/L (ref 3.5–5.1)
Sodium: 135 mmol/L (ref 135–145)
Total Bilirubin: 1.8 mg/dL — ABNORMAL HIGH (ref 0.3–1.2)
Total Protein: 6.4 g/dL — ABNORMAL LOW (ref 6.5–8.1)

## 2021-01-01 LAB — CBC
HCT: 44.5 % (ref 39.0–52.0)
Hemoglobin: 14.8 g/dL (ref 13.0–17.0)
MCH: 31.4 pg (ref 26.0–34.0)
MCHC: 33.3 g/dL (ref 30.0–36.0)
MCV: 94.5 fL (ref 80.0–100.0)
Platelets: 246 10*3/uL (ref 150–400)
RBC: 4.71 MIL/uL (ref 4.22–5.81)
RDW: 12.6 % (ref 11.5–15.5)
WBC: 10.9 10*3/uL — ABNORMAL HIGH (ref 4.0–10.5)
nRBC: 0 % (ref 0.0–0.2)

## 2021-01-01 LAB — IGG 4: IgG, Subclass 4: 31 mg/dL (ref 2–96)

## 2021-01-01 LAB — GLUCOSE, CAPILLARY
Glucose-Capillary: 63 mg/dL — ABNORMAL LOW (ref 70–99)
Glucose-Capillary: 76 mg/dL (ref 70–99)
Glucose-Capillary: 116 mg/dL — ABNORMAL HIGH (ref 70–99)

## 2021-01-01 MED ORDER — DEXTROSE IN LACTATED RINGERS 5 % IV SOLN: INTRAVENOUS | Status: DC
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
## 2021-01-01 NOTE — Evaluation (Signed)
Physical Therapy Evaluation Patient Details Name: Wesley [name] MRN: [id number] DOB: January 21, 1966 Today's Date: 01/01/2021   History of Present Illness  55 year old with past medical history significant for COPD, chronic low back pain with sciatica, history of alcohol abuse with a strict abstinence for 2 years now who presents to the emergency department complaining of severe upper abdominal pain and low back pain. Pt with acute pancreatitis. Of note: pt just had MRI yesterday for worsening LBP, showed moderate spinal stenosis L5 worsened from MRI 2020.  Clinical Impression  Pt admitted with above diagnosis. Pt with 10/10 abdominal and low back pain. Just received dilaudid from nursing. Pt required min A to rise from bed and take small steps. Further mobility deferred due to pain. Pt relays neuropathy B feet from back issues. Recommend outpt PT for low back once acute pancreatitis is resolved.   Pt currently with functional limitations due to the deficits listed below (see PT Problem List). Pt will benefit from skilled PT to increase their independence and safety with mobility to allow discharge to the venue listed below.       Follow Up Recommendations Outpatient PT (for low back pain)    Equipment Recommendations  None recommended by PT    Recommendations for Other Services       Precautions / Restrictions Precautions Precautions: Back (for comfort) Precaution Booklet Issued: No Precaution Comments: educated pt on log rolling Restrictions Weight Bearing Restrictions: No      Mobility  Bed Mobility Overal bed mobility: Needs Assistance Bed Mobility: Rolling;Sidelying to Sit;Sit to Supine Rolling: Min assist Sidelying to sit: Min assist   Sit to supine: Min assist   General bed mobility comments: vc's for log rolling for pain mgmt. Min A for LE's off bed and back onto bed to return to supine    Transfers Overall transfer level: Needs assistance   Transfers: Sit to/from  Stand Sit to Stand: Min assist         General transfer comment: min HHA to steady  Ambulation/Gait Ambulation/Gait assistance: Min assist Gait Distance (Feet): 3 Feet Assistive device: 1 person hand held assist Gait Pattern/deviations: Step-to pattern Gait velocity: decreased Gait velocity interpretation: <1.31 ft/sec, indicative of household ambulator General Gait Details: side steps to [facility], further ambulation limited by pain. Min A for support due to pain  Stairs            Wheelchair Mobility    Modified Rankin (Stroke Patients Only)       Balance Overall balance assessment: Mild deficits observed, not formally tested                                           Pertinent Vitals/Pain Pain Assessment: 0-10 Pain Score: 10-Worst pain ever Pain Location: back and LUE Pain Descriptors / Indicators: Grimacing;Guarding;Crushing Pain Intervention(s): Limited activity within patient's tolerance;Monitored during session;Premedicated before session    Home Living Family/patient expects to be discharged to:: Private residence Living Arrangements: Spouse/significant other Available Help at Discharge: Family Type of Home: House Home Access: Level entry     Home Layout: One level Home Equipment: Environmental consultant - 2 wheels;Shower seat Additional Comments: pt lives with fiancee who is on disability for neuropathy. He is in process of applying for disability for LBP and neuropathy B feet    Prior Function Level of Independence: Independent  Comments: drives     Hand Dominance        Extremity/Trunk Assessment   Upper Extremity Assessment Upper Extremity Assessment: Overall WFL for tasks assessed    Lower Extremity Assessment Lower Extremity Assessment: RLE deficits/detail;LLE deficits/detail RLE Deficits / Details: MMT limited by pain but generally >4/5, noted pf cramping in foot with pt in sitting L>R RLE Sensation: decreased  proprioception;history of peripheral neuropathy;decreased light touch RLE Coordination: decreased gross motor LLE Deficits / Details: same as RLE though pt reports LLE more painful from back than R LLE Sensation: decreased light touch;decreased proprioception;history of peripheral neuropathy LLE Coordination: decreased gross motor    Cervical / Trunk Assessment Cervical / Trunk Assessment: Normal  Communication   Communication: No difficulties  Cognition Arousal/Alertness: Awake/alert Behavior During Therapy: WFL for tasks assessed/performed Overall Cognitive Status: Within Functional Limits for tasks assessed                                        General Comments General comments (skin integrity, edema, etc.): pt reports he has not had any PT for low back pain    Exercises     Assessment/Plan    PT Assessment Patient needs continued PT services  PT Problem List Decreased mobility;Impaired sensation;Pain;Decreased knowledge of use of DME       PT Treatment Interventions DME instruction;Gait training;Functional mobility training;Therapeutic activities;Therapeutic exercise;Balance training;Patient/family education    PT Goals (Current goals can be found in the Care Plan section)  Acute Rehab PT Goals Patient Stated Goal: decreased pain PT Goal Formulation: With patient Time For Goal Achievement: 01/15/21 Potential to Achieve Goals: Good    Frequency Min 3X/week   Barriers to discharge        Co-evaluation               AM-PAC PT "6 Clicks" Mobility  Outcome Measure Help needed turning from your back to your side while in a flat bed without using bedrails?: None Help needed moving from lying on your back to sitting on the side of a flat bed without using bedrails?: A Little Help needed moving to and from a bed to a chair (including a wheelchair)?: A Little Help needed standing up from a chair using your arms (e.g., wheelchair or bedside chair)?:  A Little Help needed to walk in hospital room?: A Little Help needed climbing 3-5 steps with a railing? : A Little 6 Click Score: 19    End of Session   Activity Tolerance: Patient limited by pain Patient left: in bed;with call bell/phone within reach;with nursing/sitter in room;with bed alarm set Nurse Communication: Mobility status PT Visit Diagnosis: Unsteadiness on feet (R26.81);Pain Pain - part of body:  (back and abdomen)    Time: 7124-5809 PT Time Calculation (min) (ACUTE ONLY): 14 min   Charges:   PT Evaluation $PT Eval Moderate Complexity: [place], PT  Acute Rehab Services  Pager [phone] Office [place] 01/01/2021, 10:05 AM

## 2021-01-02 ENCOUNTER — Ambulatory Visit: Payer: Self-pay | Admitting: Family Medicine

## 2021-01-02 ENCOUNTER — Ambulatory Visit: Payer: Self-pay | Admitting: Critical Care Medicine

## 2021-01-02 DIAGNOSIS — K567 Ileus, unspecified: Secondary | ICD-10-CM

## 2021-01-02 DIAGNOSIS — J181 Lobar pneumonia, unspecified organism: Secondary | ICD-10-CM

## 2021-01-02 DIAGNOSIS — E876 Hypokalemia: Secondary | ICD-10-CM

## 2021-01-02 DIAGNOSIS — F1021 Alcohol dependence, in remission: Secondary | ICD-10-CM

## 2021-01-02 LAB — COMPREHENSIVE METABOLIC PANEL
ALT: 11 U/L (ref 0–44)
AST: 19 U/L (ref 15–41)
Albumin: 2.7 g/dL — ABNORMAL LOW (ref 3.5–5.0)
Alkaline Phosphatase: 57 U/L (ref 38–126)
Anion gap: 9 (ref 5–15)
BUN: <5 mg/dL — ABNORMAL LOW (ref 6–20)
CO2: 22 mmol/L (ref 22–32)
Calcium: 8.1 mg/dL — ABNORMAL LOW (ref 8.9–10.3)
Chloride: 103 mmol/L (ref 98–111)
Creatinine, Ser: 1.01 mg/dL (ref 0.61–1.24)
GFR, Estimated: >60 mL/min (ref >60)
Glucose, Bld: 107 mg/dL — ABNORMAL HIGH (ref 70–99)
Potassium: 3.1 mmol/L — ABNORMAL LOW (ref 3.5–5.1)
Sodium: 134 mmol/L — ABNORMAL LOW (ref 135–145)
Total Bilirubin: 1.5 mg/dL — ABNORMAL HIGH (ref 0.3–1.2)
Total Protein: 5.8 g/dL — ABNORMAL LOW (ref 6.5–8.1)

## 2021-01-02 LAB — CBC
HCT: 40.6 % (ref 39.0–52.0)
Hemoglobin: 14.6 g/dL (ref 13.0–17.0)
MCH: 32.6 pg (ref 26.0–34.0)
MCHC: 36 g/dL (ref 30.0–36.0)
MCV: 90.6 fL (ref 80.0–100.0)
Platelets: 240 10*3/uL (ref 150–400)
RBC: 4.48 MIL/uL (ref 4.22–5.81)
RDW: 12.7 % (ref 11.5–15.5)
WBC: 13.3 10*3/uL — ABNORMAL HIGH (ref 4.0–10.5)
nRBC: 0 % (ref 0.0–0.2)

## 2021-01-02 LAB — GLUCOSE, CAPILLARY
Glucose-Capillary: 128 mg/dL — ABNORMAL HIGH (ref 70–99)
Glucose-Capillary: 128 mg/dL — ABNORMAL HIGH (ref 70–99)
Glucose-Capillary: 134 mg/dL — ABNORMAL HIGH (ref 70–99)

## 2021-01-02 LAB — SARS CORONAVIRUS 2 BY RT PCR (HOSPITAL ORDER, PERFORMED IN ~~LOC~~ HOSPITAL LAB): SARS Coronavirus 2: NEGATIVE

## 2021-01-02 MED ORDER — SENNOSIDES-DOCUSATE SODIUM 8.6-50 MG PO TABS
1.0000 | ORAL_TABLET | Freq: Two times a day (BID) | ORAL | Status: DC
  Administered 2021-01-02 – 2021-01-07 (×8): 1 via ORAL
  Filled 2021-01-02 (×11): qty 1

## 2021-01-02 MED ORDER — BISACODYL 10 MG RE SUPP
10.0000 mg | Freq: Once | RECTAL | Status: DC
  Filled 2021-01-02: qty 1

## 2021-01-02 MED ORDER — POLYETHYLENE GLYCOL 3350 17 G PO PACK
17.0000 g | PACK | Freq: Every day | ORAL | Status: DC
  Administered 2021-01-02 – 2021-01-03 (×2): 17 g via ORAL
  Filled 2021-01-02 (×2): qty 1

## 2021-01-02 MED ORDER — METRONIDAZOLE IN NACL 5-0.79 MG/ML-% IV SOLN
500.0000 mg | Freq: Three times a day (TID) | INTRAVENOUS | Status: DC
  Administered 2021-01-02 – 2021-01-08 (×19): 500 mg via INTRAVENOUS
  Filled 2021-01-02 (×20): qty 100

## 2021-01-02 MED ORDER — POTASSIUM CHLORIDE 10 MEQ/100ML IV SOLN
10.0000 meq | INTRAVENOUS | Status: AC
  Administered 2021-01-02 (×4): 10 meq via INTRAVENOUS
  Filled 2021-01-02: qty 100

## 2021-01-02 MED ORDER — SODIUM CHLORIDE 0.9 % IV SOLN
1.0000 g | INTRAVENOUS | Status: DC
  Administered 2021-01-02 – 2021-01-06 (×5): 1 g via INTRAVENOUS
  Filled 2021-01-02 (×5): qty 10

## 2021-01-02 NOTE — Progress Notes (Signed)
Physical Therapy Treatment Patient Details Name: Wesley [name] MRN: [id number] DOB: August 19, 1966 Today's Date: 01/02/2021    History of Present Illness 55 year old with past medical history significant for COPD, chronic low back pain with sciatica, history of alcohol abuse with a strict abstinence for 2 years now who presents to the emergency department complaining of severe upper abdominal pain and low back pain. Pt with acute pancreatitis. Of note: pt just had MRI yesterday for worsening LBP, showed moderate spinal stenosis L5 worsened from MRI 2020.    PT Comments    Pt making progress towards his goals as his pain is improving. Coordinated with RN in regards to pain meds prior to treatment session in order to advance gait this date, with success. Pt able to ambulate safely with min guard with a RW but needs minA when utilizing a SPC due to unsteadiness and LOB, most likely caused by pain noted when attempted. Thus, educated pt to decrease pain and maximize safety and independence with mobility utilizing a RW initially upon d/c. Will continue to follow acutely. Current recommendations remain appropriate.   Follow Up Recommendations  Outpatient PT (for low back pain)     Equipment Recommendations  None recommended by PT    Recommendations for Other Services       Precautions / Restrictions Precautions Precautions: Back (for comfort) Precaution Booklet Issued: No Precaution Comments: Reviewed precautions for comfort Restrictions Weight Bearing Restrictions: No    Mobility  Bed Mobility               General bed mobility comments: Pt standing at sink with OT upon arrival.  Transfers                 General transfer comment: Pt standing at sink with OT upon arrival.  Ambulation/Gait Ambulation/Gait assistance: Min guard;Min assist Gait Distance (Feet): 215 Feet Assistive device: Rolling walker (2 wheeled);Straight cane Gait Pattern/deviations: Step-through  pattern;Decreased step length - right;Decreased step length - left;Decreased stride length;Trunk flexed Gait velocity: decreased Gait velocity interpretation: <1.31 ft/sec, indicative of household ambulator General Gait Details: Pt ambulates slowly with mild unsteadiness with RW, utilizing UEs with trunk flexed to unweight back and relieve pain. Attempted gait with SPC in L hand but increased trunk sway and LOB, thus educated pt on using RW at this time for safety until pain diminishes. Min guard with RW but minA with SPC.   Stairs             Wheelchair Mobility    Modified Rankin (Stroke Patients Only)       Balance Overall balance assessment: Mild deficits observed, not formally tested                                          Cognition Arousal/Alertness: Awake/alert Behavior During Therapy: WFL for tasks assessed/performed Overall Cognitive Status: Within Functional Limits for tasks assessed                                        Exercises      General Comments        Pertinent Vitals/Pain Pain Assessment: 0-10 Pain Score: 3  Pain Location: L side of abdomen and back Pain Descriptors / Indicators: Grimacing;Guarding;Aching Pain Intervention(s): Limited activity within patient's tolerance;Monitored during session;Premedicated before session;Repositioned  Home Living                      Prior Function            PT Goals (current goals can now be found in the care plan section) Acute Rehab PT Goals Patient Stated Goal: decreased pain PT Goal Formulation: With patient Time For Goal Achievement: 01/15/21 Potential to Achieve Goals: Good Progress towards PT goals: Progressing toward goals    Frequency    Min 3X/week      PT Plan Current plan remains appropriate    Co-evaluation              AM-PAC PT "6 Clicks" Mobility   Outcome Measure  Help needed turning from your back to your side while  in a flat bed without using bedrails?: None Help needed moving from lying on your back to sitting on the side of a flat bed without using bedrails?: A Little Help needed moving to and from a bed to a chair (including a wheelchair)?: A Little Help needed standing up from a chair using your arms (e.g., wheelchair or bedside chair)?: A Little Help needed to walk in hospital room?: A Little Help needed climbing 3-5 steps with a railing? : A Little 6 Click Score: 19    End of Session Equipment Utilized During Treatment: Gait belt Activity Tolerance: Patient limited by pain;Patient tolerated treatment well Patient left: with call bell/phone within reach;in chair;with chair alarm set Nurse Communication: Mobility status PT Visit Diagnosis: Unsteadiness on feet (R26.81);Pain;Other abnormalities of gait and mobility (R26.89);Difficulty in walking, not elsewhere classified (R26.2) Pain - part of body:  (back and abdomen)     Time: 1856-3149 PT Time Calculation (min) (ACUTE ONLY): 19 min  Charges:  $Gait Training: 8-22 mins                     Moishe Spice, PT, DPT Acute Rehabilitation Services  Pager: [phone] Office: [place] 01/02/2021, 10:20 AM

## 2021-01-02 NOTE — Progress Notes (Signed)
PROGRESS NOTE    Wesley [name]  RSW:[id number] DOB: 27-Feb-1966 DOA: 12/30/2020 PCP: [name], MD    Chief Complaint  Patient presents with  . Abdominal Pain  . Back Pain    Brief Narrative:  55 year old with past medical history significant for COPD, chronic low back pain with sciatica, history of alcohol abuse with  strict abstinence for 2 years now who presents to the emergency department complaining of severe upper abdominal pain and low back pain.  He follows with orthopedic surgery for his low back pain.  He had MRI  12/29/2020 with progression and is a scheduled to follow-up on that with Dr. [name] but he developed worsening severe upper abdominal pain and presented to the ED.  Evaluation in the ED he was found to have a lipase elevated at 87, CT abdomen and pelvis showed peripancreatic fat stranding without focal fluid collection or necrosis.  Subjective:   Fever 101.8 last night, reports throat pain and started to cough yesterday, he is put on oxygen yesterday  Less ab pain , still has some nausea, but no vomiting, has not pass gas for a few days, this morning to pass gas Last bm was saturday  Assessment & Plan:   Principal Problem:   Acute pancreatitis Active Problems:   COPD with chronic bronchitis (HCC)   Alcohol dependence in remission ([place])   Chronic midline low back pain with bilateral sciatica  Pneumonia? -He developed fever last night, chest x-ray showed bilateral lower lobe infiltrate, reports started cough, and sore throat -Denies nausea vomiting, denies dysphagia -Repeat Covid testing -Start Rocephin and Flagyl for possible aspiration pneumonia due to history of alcohol use, will check procalcitonin  Acute pancreatitis -Presents with abdominal pain, CT findings consistent with acute pancreatitis. -He has been sober for alcohol for 2 years. -Right upper quadrant ultrasound showed prior cholecystectomy -Triglyceride not elevated. - IgG4 at 31.   -Report after pain is much improved today, down from 10 out of 10 on presentation to 3 out of 10 -He desires diet advancement, will start clear liquid, advance as tolerated, trend lipase  Hypoglycemia event Due to n.p.o. On hypoglycemia protocol Advance diet as tolerated  Ileus/Constipation;  -Report passing gas, ab pain has improved, no nausea no vomiting -Advance diet as tolerated ,encourage activity, start stool softener  Hypokalemia, replace K, check mag  COPD, baseline not oxygen dependent No wheezing on exam  Low back pain MRI from yesterday showed progressive degenerative changes. Patient need to follow-up with  Dr [name] , has appointment 01/04/2021 PT recommends outpatient PT   DVT prophylaxis: enoxaparin (LOVENOX) injection 40 mg Start: 12/30/20 2130   Code Status: Full Family Communication: Patient Disposition:   Status is: Inpatient   Dispo: The patient is from: Home              Anticipated d/c is to: Home              Anticipated d/c date is: To be determined, need to able to tolerate diet advancement                Consultants:   None  Procedures:   None  Antimicrobials:   Flagyl/Rocephin     Objective: Vitals:   01/02/21 0355 01/02/21 0402 01/02/21 0716 01/02/21 1134  BP: 121/76  120/77 120/80  Pulse: 84  75 88  Resp: _0 Temp: 99.4 F (37.4 C)  99.5 F (37.5 C) 99.4 F (37.4 C)  TempSrc: Oral  Oral Oral  SpO2: 95%  93% 98%  Weight:  76.1 kg    Height:        Intake/Output Summary (Last 24 hours) at 01/02/2021 1244 Last data filed at 01/01/2021 2140 Gross per 24 hour  Intake --  Output 200 ml  Net -200 ml   Filed Weights   12/30/20 1642 01/01/21 0405 01/02/21 0402  Weight: 74.8 kg 73.8 kg 76.1 kg    Examination:  General exam: calm, NAD Respiratory system: Clear to auscultation. Respiratory effort normal. Cardiovascular system: S1 & S2 heard, RRR. No JVD, no murmur, No pedal edema. Gastrointestinal system: Abdomen  is nondistended, soft and nontender. Normal bowel sounds heard. Well healed midline surgical scan Central nervous system: Alert and oriented. No focal neurological deficits. Extremities: Symmetric 5 x 5 power. Skin: No rashes, lesions or ulcers Psychiatry: Judgement and insight appear normal. Mood & affect appropriate.     Data Reviewed: I have personally reviewed following labs and imaging studies  CBC: Recent Labs  Lab 12/30/20 1306 12/31/20 0340 01/01/21 0351 01/02/21 0419  WBC 10.0 10.0 10.9* 13.3*  HGB 17.3* 15.5 14.8 14.6  HCT 49.5 46.8 44.5 40.6  MCV 91.3 95.5 94.5 90.6  PLT 321 246 246 177    Basic Metabolic Panel: Recent Labs  Lab 12/30/20 1306 12/31/20 0340 01/01/21 0351 01/02/21 0419  NA 135 136 135 134*  K 3.8 3.7 4.0 3.1*  CL 101 107 104 103  CO2 22 19* 19* 22  GLUCOSE 102* 96 64* 107*  BUN _0 <5*  CREATININE 1.15 1.00 0.97 1.01  CALCIUM 9.1 8.1* 8.3* 8.1*    GFR: Estimated Creatinine Clearance: 80 mL/min (by C-G formula based on SCr of 1.01 mg/dL).  Liver Function Tests: Recent Labs  Lab 12/30/20 1306 12/31/20 0340 01/01/21 0351 01/02/21 0419  AST _1 ALT _2 ALKPHOS 77 61 58 57  BILITOT 1.1 1.3* 1.8* 1.5*  PROT 7.9 6.3* 6.4* 5.8*  ALBUMIN 3.9 3.2* 3.1* 2.7*    CBG: Recent Labs  Lab 01/01/21 0838 01/01/21 0953 01/01/21 2325 01/02/21 0608 01/02/21 1236  GLUCAP 63* 76 116* 128* 128*     Recent Results (from the past 240 hour(s))  SARS CORONAVIRUS 2 (TAT 6-24 HRS) Nasopharyngeal Nasopharyngeal Swab     Status: None   Collection Time: 12/30/20 10:34 PM   Specimen: Nasopharyngeal Swab  Result Value Ref Range Status   SARS Coronavirus 2 NEGATIVE NEGATIVE Final    Comment: (NOTE) SARS-CoV-2 target nucleic acids are NOT DETECTED.  The SARS-CoV-2 RNA is generally detectable in upper and lower respiratory specimens during the acute phase of infection. Negative results do not preclude SARS-CoV-2 infection, do  not rule out co-infections with other pathogens, and should not be used as the sole basis for treatment or other patient management decisions. Negative results must be combined with clinical observations, patient history, and epidemiological information. The expected result is Negative.  Fact Sheet for Patients: SugarRoll.be  Fact Sheet for Healthcare Providers: https://www.woods-mathews.com/  This test is not yet approved or cleared by the [place] FDA and  has been authorized for detection and/or diagnosis of SARS-CoV-2 by FDA under an Emergency Use Authorization (EUA). This EUA will remain  in effect (meaning this test can be used) for the duration of the COVID-19 declaration under Se ction 564(b)(1) of the Act, 21 U.S.C. section 360bbb-3(b)(1), unless the authorization is terminated or revoked sooner.  Performed at [facility],  [address], [place], [place] [address]   Culture, blood (routine x 2)     Status: None (Preliminary result)   Collection Time: 01/01/21  9:58 PM   Specimen: BLOOD RIGHT HAND  Result Value Ref Range Status   Specimen Description BLOOD RIGHT HAND  Final   Special Requests   Final    BOTTLES DRAWN AEROBIC ONLY Blood Culture adequate volume   Culture   Final    NO GROWTH < 12 HOURS Performed at [facility], [place] [address], [place], [place] [address]    Report Status PENDING  Incomplete  Culture, blood (routine x 2)     Status: None (Preliminary result)   Collection Time: 01/01/21  9:58 PM   Specimen: BLOOD LEFT HAND  Result Value Ref Range Status   Specimen Description BLOOD LEFT HAND  Final   Special Requests   Final    BOTTLES DRAWN AEROBIC ONLY Blood Culture adequate volume   Culture   Final    NO GROWTH < 12 HOURS Performed at [facility], [place] [address] [place], [place] [address]    Report Status PENDING  Incomplete         Radiology Studies: DG Chest 1  View  Result Date: 01/01/2021 CLINICAL DATA:  Pneumonia, left chest pain with inspiration EXAM: CHEST  1 VIEW COMPARISON:  Radiograph 04/10/2019 FINDINGS: Poor inspiratory effort. Some more patchy and heterogeneous areas of mixed airspace and interstitial opacity are present in the mid to lower lungs. More focal opacity is also present in the retrocardiac space. No pneumothorax. No visible effusion. The cardiomediastinal contours are unremarkable. No acute osseous or soft tissue abnormality. Cholecystectomy clips in the right upper quadrant. IMPRESSION: Airspace opacity in the mid to lower lungs compatible with multifocal pneumonia including atypical viral etiologies. Electronically Signed   By: Lovena Le M.D.   On: 01/01/2021 21:34   DG Abd 1 View  Result Date: 01/01/2021 CLINICAL DATA:  Constipation and nausea.  Acute pancreatitis. EXAM: ABDOMEN - 1 VIEW COMPARISON:  CT abdomen and pelvis 12/30/2020 FINDINGS: There is mild gaseous distension of loops of colon and to a lesser extent small bowel. A moderate amount of stool is noted in the colon. Cholecystectomy clips are noted, and there is a retained metallic projectile in the right pelvis. IMPRESSION: Mild gaseous bowel distension which may reflect mild ileus. Electronically Signed   By: Logan Bores M.D.   On: 01/01/2021 10:20        Scheduled Meds: . bisacodyl  10 mg Rectal Once  . enoxaparin (LOVENOX) injection  40 mg Subcutaneous Q24H  . pantoprazole (PROTONIX) IV  40 mg Intravenous Q12H  . polyethylene glycol  17 g Oral Daily  . senna-docusate  1 tablet Oral BID   Continuous Infusions: . cefTRIAXone (ROCEPHIN)  IV    . dextrose 5% lactated ringers 125 mL/hr at 01/02/21 0402  . famotidine (PEPCID) IV 20 mg (01/02/21 0832)     LOS: 3 days   Time spent: 29mns Greater than 50% of this time was spent in counseling, explanation of diagnosis, planning of further management, and coordination of care.  I have personally reviewed and  interpreted on  01/02/2021 daily labs,  imagings as discussed above under date review session and assessment and plans.  I reviewed all nursing notes, pharmacy notes,   vitals, pertinent old records  I have discussed plan of care as described above with RN , patient on 01/02/2021  Voice Recognition /Viviann Spare  dictation system was used to create this note, attempts have been made to correct errors. Please contact the author with questions and/or clarifications.   Florencia Reasons, MD PhD FACP [facility]  Available via Epic secure chat 7am-7pm for nonurgent issues Please page for urgent issues To page the attending provider between 7A-7P or the covering provider during after hours 7P-7A, please log into the web site [email or web address] and access using universal [place] password for that web site. If you do not have the password, please call the hospital operator.    01/02/2021, 12:44 PM

## 2021-01-02 NOTE — Evaluation (Signed)
Occupational Therapy Evaluation Patient Details Name: Wesley [name] MRN: [id number] DOB: [birth year] Today's Date: 01/02/2021    History of Present Illness 55 year old with past medical history significant for COPD, chronic low back pain with sciatica, history of alcohol abuse with a strict abstinence for 2 years now who presents to the emergency department complaining of severe upper abdominal pain and low back pain. Pt with acute pancreatitis. Of note: pt just had MRI yesterday for worsening LBP, showed moderate spinal stenosis L5 worsened from MRI 2020.   Clinical Impression   PTA patient was living with his significant other in a private residence and was independent with ADLs/IADLs without AD. Patient currently functioning slightly below baseline demonstrating deficits in balance and limited by severe pain in low back and side requiring Min guard to Min A overall for ADLs. OT provided education on spinal precautions for pain management, home set-up to maximize safety and independence with self-care tasks. Patient expressed verbal understanding but requires continued education 2/2 short-term memory deficits (likely baseline). OT to provide education on acquisition/use of AE for LB bathing/dressing at time of next treatment session.OT will continue to follow acutely.     Follow Up Recommendations  No OT follow up    Equipment Recommendations  3 in 1 bedside commode    Recommendations for Other Services       Precautions / Restrictions Precautions Precautions: Back (For pain management) Precaution Booklet Issued: No Precaution Comments: Verbally reviewed precautions. Patient only able to recall 1/3. Requires continued education. Restrictions Weight Bearing Restrictions: No      Mobility Bed Mobility Overal bed mobility: Needs Assistance Bed Mobility: Rolling;Sidelying to Sit;Sit to Supine Rolling: Supervision Sidelying to sit: Min guard       General bed mobility comments:  Supervision A for rolling to L with cues for log rolling technique. Min guard for sidelying to sit +rail.    Transfers Overall transfer level: Needs assistance Equipment used: Rolling walker (2 wheeled) Transfers: Sit to/from Stand Sit to Stand: Min guard         General transfer comment: Min guard x2 from EOB and BSC. Cues for hand placement on RW.    Balance Overall balance assessment: Mild deficits observed, not formally tested                                         ADL either performed or assessed with clinical judgement   ADL Overall ADL's : Needs assistance/impaired     Grooming: Supervision/safety;Standing Grooming Details (indicate cue type and reason): Hand/face washing at sink level.         Upper Body Dressing : Set up;Sitting   Lower Body Dressing: Min guard;Sitting/lateral leans   Toilet Transfer: Min Fish farm manager Details (indicate cue type and reason): Cues for walker management Toileting- Clothing Manipulation and Hygiene: Min guard;Sit to/from stand Toileting - Clothing Manipulation Details (indicate cue type and reason): Min guard for safety and cues for adherence to back precautions for pain management.     Functional mobility during ADLs: Min guard;Rolling walker       Vision   Vision Assessment?: No apparent visual deficits     Perception     Praxis      Pertinent Vitals/Pain Pain Assessment: 0-10 Pain Score: 3  Pain Location: L side of abdomen and back Pain Descriptors / Indicators: Grimacing;Guarding;Aching Pain Intervention(s): Premedicated before session;Monitored during session;Repositioned;Limited  activity within patient's tolerance     Hand Dominance Right   Extremity/Trunk Assessment Upper Extremity Assessment Upper Extremity Assessment: Overall WFL for tasks assessed   Lower Extremity Assessment Lower Extremity Assessment: Defer to PT evaluation   Cervical / Trunk Assessment Cervical /  Trunk Assessment: Normal   Communication Communication Communication: No difficulties   Cognition Arousal/Alertness: Awake/alert Behavior During Therapy: WFL for tasks assessed/performed Overall Cognitive Status: Impaired/Different from baseline Area of Impairment: Memory                     Memory: Decreased short-term memory         General Comments: Decreased ability to recall back precautions after education.   General Comments  Education provided on back precautions for pain management.    Exercises     Shoulder Instructions      Home Living Family/patient expects to be discharged to:: Private residence Living Arrangements: Spouse/significant other Available Help at Discharge: Family Type of Home: House Home Access: Level entry     Home Layout: One level     Bathroom Shower/Tub: Teacher, early years/pre: Handicapped height     Home Equipment: Environmental consultant - 2 wheels;Shower seat   Additional Comments: pt lives with fiancee who is on disability for neuropathy. He is in process of applying for disability for LBP and neuropathy B feet      Prior Functioning/Environment Level of Independence: Independent                 OT Problem List: Decreased activity tolerance;Impaired balance (sitting and/or standing);Decreased knowledge of use of DME or AE;Pain      OT Treatment/Interventions: Self-care/ADL training;Therapeutic exercise;Energy conservation;DME and/or AE instruction;Therapeutic activities;Patient/family education    OT Goals(Current goals can be found in the care plan section) Acute Rehab OT Goals Patient Stated Goal: To decrease pain. OT Goal Formulation: With patient Time For Goal Achievement: 01/16/21 Potential to Achieve Goals: Good ADL Goals Pt Will Perform Grooming: with modified independence;standing Pt Will Perform Lower Body Dressing: with modified independence;sit to/from stand;with adaptive equipment Pt Will Transfer to  Toilet: with modified independence;ambulating;bedside commode Pt Will Perform Toileting - Clothing Manipulation and hygiene: with modified independence;sit to/from stand  OT Frequency: Min 2X/week   Barriers to D/C:            Co-evaluation              AM-PAC OT "6 Clicks" Daily Activity     Outcome Measure Help from another person eating meals?: None Help from another person taking care of personal grooming?: A Little Help from another person toileting, which includes using toliet, bedpan, or urinal?: A Little Help from another person bathing (including washing, rinsing, drying)?: A Little Help from another person to put on and taking off regular upper body clothing?: None Help from another person to put on and taking off regular lower body clothing?: A Little 6 Click Score: 20   End of Session Equipment Utilized During Treatment: Gait belt;Rolling walker  Activity Tolerance: Patient tolerated treatment well Patient left: Other (comment) (Handoff to PT for treatment session.)  OT Visit Diagnosis: Pain Pain - part of body:  (Back and side)                Time: 8185-6314 OT Time Calculation (min): 17 min Charges:  OT General Charges $OT Visit: 1 Visit OT Evaluation $OT Eval Moderate Complexity: 1 Mod  Kristine Chahal H. OTR/L Supplemental OT, Department of rehab services (  [place] [facility]. 01/02/2021, 11:01 AM

## 2021-01-03 LAB — CBC WITH DIFFERENTIAL/PLATELET
Abs Immature Granulocytes: 0.07 10*3/uL (ref 0.00–0.07)
Basophils Absolute: 0.1 10*3/uL (ref 0.0–0.1)
Basophils Relative: 0 %
Eosinophils Absolute: 0.4 10*3/uL (ref 0.0–0.5)
Eosinophils Relative: 2 %
HCT: 42.1 % (ref 39.0–52.0)
Hemoglobin: 14.5 g/dL (ref 13.0–17.0)
Immature Granulocytes: 0 %
Lymphocytes Relative: 13 %
Lymphs Abs: 2.1 10*3/uL (ref 0.7–4.0)
MCH: 31.4 pg (ref 26.0–34.0)
MCHC: 34.4 g/dL (ref 30.0–36.0)
MCV: 91.1 fL (ref 80.0–100.0)
Monocytes Absolute: 2.6 10*3/uL — ABNORMAL HIGH (ref 0.1–1.0)
Monocytes Relative: 17 %
Neutro Abs: 10.7 10*3/uL — ABNORMAL HIGH (ref 1.7–7.7)
Neutrophils Relative %: 68 %
Platelets: 235 10*3/uL (ref 150–400)
RBC: 4.62 MIL/uL (ref 4.22–5.81)
RDW: 12.5 % (ref 11.5–15.5)
WBC: 15.9 10*3/uL — ABNORMAL HIGH (ref 4.0–10.5)
nRBC: 0 % (ref 0.0–0.2)

## 2021-01-03 LAB — COMPREHENSIVE METABOLIC PANEL
ALT: 13 U/L (ref 0–44)
AST: 19 U/L (ref 15–41)
Albumin: 2.6 g/dL — ABNORMAL LOW (ref 3.5–5.0)
Alkaline Phosphatase: 57 U/L (ref 38–126)
Anion gap: 10 (ref 5–15)
BUN: <5 mg/dL — ABNORMAL LOW (ref 6–20)
CO2: 23 mmol/L (ref 22–32)
Calcium: 8.2 mg/dL — ABNORMAL LOW (ref 8.9–10.3)
Chloride: 101 mmol/L (ref 98–111)
Creatinine, Ser: 1.03 mg/dL (ref 0.61–1.24)
GFR, Estimated: >60 mL/min (ref >60)
Glucose, Bld: 101 mg/dL — ABNORMAL HIGH (ref 70–99)
Potassium: 3.4 mmol/L — ABNORMAL LOW (ref 3.5–5.1)
Sodium: 134 mmol/L — ABNORMAL LOW (ref 135–145)
Total Bilirubin: 1.5 mg/dL — ABNORMAL HIGH (ref 0.3–1.2)
Total Protein: 6 g/dL — ABNORMAL LOW (ref 6.5–8.1)

## 2021-01-03 LAB — PROCALCITONIN: Procalcitonin: <0.1 ng/mL

## 2021-01-03 LAB — GLUCOSE, CAPILLARY
Glucose-Capillary: 96 mg/dL (ref 70–99)
Glucose-Capillary: 114 mg/dL — ABNORMAL HIGH (ref 70–99)
Glucose-Capillary: 104 mg/dL — ABNORMAL HIGH (ref 70–99)

## 2021-01-03 LAB — URINE CULTURE: Culture: NO GROWTH

## 2021-01-03 LAB — LIPASE, BLOOD: Lipase: 104 U/L — ABNORMAL HIGH (ref 11–51)

## 2021-01-03 LAB — MAGNESIUM: Magnesium: 1.6 mg/dL — ABNORMAL LOW (ref 1.7–2.4)

## 2021-01-03 MED ORDER — HYDROMORPHONE HCL 1 MG/ML IJ SOLN
0.5000 mg | INTRAMUSCULAR | Status: DC | PRN
  Administered 2021-01-03 – 2021-01-08 (×25): 1 mg via INTRAVENOUS
  Filled 2021-01-03 (×26): qty 1

## 2021-01-03 MED ORDER — POTASSIUM CHLORIDE CRYS ER 20 MEQ PO TBCR
40.0000 meq | EXTENDED_RELEASE_TABLET | Freq: Once | ORAL | Status: AC
  Administered 2021-01-03: 40 meq via ORAL
  Filled 2021-01-03: qty 2

## 2021-01-03 MED ORDER — MAGNESIUM SULFATE 2 GM/50ML IV SOLN
2.0000 g | Freq: Once | INTRAVENOUS | Status: AC
  Administered 2021-01-03: 2 g via INTRAVENOUS
  Filled 2021-01-03: qty 50

## 2021-01-03 MED ORDER — GUAIFENESIN ER 600 MG PO TB12
600.0000 mg | ORAL_TABLET | Freq: Two times a day (BID) | ORAL | Status: DC
  Administered 2021-01-03 – 2021-01-08 (×11): 600 mg via ORAL
  Filled 2021-01-03 (×11): qty 1

## 2021-01-03 MED ORDER — POLYETHYLENE GLYCOL 3350 17 G PO PACK
17.0000 g | PACK | Freq: Two times a day (BID) | ORAL | Status: DC
  Administered 2021-01-04 – 2021-01-07 (×2): 17 g via ORAL
  Filled 2021-01-03 (×7): qty 1

## 2021-01-03 NOTE — TOC Initial Note (Signed)
Transition of Care [facility]) - Initial/Assessment Note    Patient Details  Name: Wesley [name] MRN: [id number] Date of Birth: 1966/11/23  Transition of [facility]) CM/SW Contact:    Pollie Friar, RN Phone Number: 01/03/2021, 12:35 PM  Clinical Narrative:                 Pt is from home with his fiance. Pt is active with [facility] and its pharmacy.  Pt states he does need the 3 in 1 for home. CM will have delivered to the room closer to d/c.  Pt also wishes to go to outpatient therapy. He has no preference on the location.  TOC following.   Expected Discharge Plan: OP Rehab Barriers to Discharge: Continued Medical Work up,Inadequate or no insurance   Patient Goals and CMS Choice     Choice offered to / list presented to : Patient  Expected Discharge Plan and Services Expected Discharge Plan: OP Rehab   Discharge Planning Services: CM Consult Post Acute Care Choice: Durable Medical Equipment Living arrangements for the past 2 months: Single Family Home                 DME Arranged: 3-N-1 DME Agency: [employer]     Representative spoke with at DME Agency: [place] Arrangements/Services Living arrangements for the past 2 months: [place] with:: Significant Other Patient language and need for interpreter reviewed:: Yes Do you feel safe going back to the place where you live?: Yes      Need for Family Participation in Patient Care: Yes (Comment) Care giver support system in place?: Yes (comment)   Criminal Activity/Legal Involvement Pertinent to Current Situation/Hospitalization: No - Comment as needed  Activities of Daily Living Home Assistive Devices/Equipment: None ADL Screening (condition at time of admission) Patient's cognitive ability adequate to safely complete daily activities?: Yes Is the patient deaf or have difficulty hearing?: No Does the patient have difficulty seeing, even when wearing glasses/contacts?:  No Does the patient have difficulty concentrating, remembering, or making decisions?: No Patient able to express need for assistance with ADLs?: Yes Does the patient have difficulty dressing or bathing?: No Independently performs ADLs?: Yes (appropriate for developmental age) Does the patient have difficulty walking or climbing stairs?: No Weakness of Legs: None Weakness of Arms/Hands: None  Permission Sought/Granted                  Emotional Assessment Appearance:: Appears stated age Attitude/Demeanor/Rapport: Engaged Affect (typically observed): Accepting Orientation: : Oriented to Self,Oriented to Place,Oriented to  Time,Oriented to Situation   Psych Involvement: No (comment)  Admission diagnosis:  Acute pancreatitis [K85.90] Acute pancreatitis without infection or necrosis, unspecified pancreatitis type [K85.90] Patient Active Problem List   Diagnosis Date Noted  . Acute pancreatitis 12/30/2020  . Chronic midline low back pain with bilateral sciatica 12/13/2020  . Nocturia associated with benign prostatic hyperplasia 12/13/2020  . Neurogenic claudication due to lumbar spinal stenosis 12/13/2020  . Chronic viral hepatitis C ([place]) 05/04/2019  . Chronic thrombosis of splenic vein 04/10/2019  . Alcohol dependence in remission ([place]) 04/10/2019  . Malnutrition of moderate degree 12/13/2018  . Chronic pain syndrome 02/08/2018  . Neuropathy 12/06/2017  . SMOKER 08/07/2010  . COPD with chronic bronchitis ([place]) 08/07/2010   PCP:  [name], MD Pharmacy:   [facility], [address] [place]  [place] [address] Phone: [phone] Fax: [phone]  [id number] [address]Phone: [phone]: [phone]    Social Determinants of Health (SDOH) Interventions    Readmission Risk Interventions No flowsheet data  found.

## 2021-01-03 NOTE — Progress Notes (Signed)
PROGRESS NOTE    Wesley [name]  PIR:[id number] DOB: August 09, 1966 DOA: 12/30/2020 PCP: [name], MD    Chief Complaint  Patient presents with  . Abdominal Pain  . Back Pain    Brief Narrative:  55 year old with past medical history significant for COPD, chronic low back pain with sciatica, history of alcohol abuse with  strict abstinence for 2 years now who presents to the emergency department complaining of severe upper abdominal pain and low back pain.  He follows with orthopedic surgery for his low back pain.  He had MRI  12/29/2020 with progression and is a scheduled to follow-up on that with Dr. [name] but he developed worsening severe upper abdominal pain and presented to the ED.  Evaluation in the ED he was found to have a lipase elevated at 87, CT abdomen and pelvis showed peripancreatic fat stranding without focal fluid collection or necrosis.  Subjective:   Fever 100.5 last night, wbc worsening  C/o reports abdominal pain, worse at 7, got down to 2 after pain medication, positive flatus, had a smear stool yesterday, attempt to drink some water today, no vomiting  reports continue to have throat pain, cough seems has resolved, on room air today   Assessment & Plan:   Principal Problem:   Acute pancreatitis Active Problems:   COPD with chronic bronchitis (HCC)   Alcohol dependence in remission ([place])   Chronic midline low back pain with bilateral sciatica  Pneumonia? -He developed fever 101.8 (2/1-2/2) night  -chest x-ray showed bilateral lower lobe infiltrate, with cough, and sore throat -Denies nausea vomiting, denies dysphagia -Repeat Covid testing negative -Started Rocephin and Flagyl for possible aspiration pneumonia due to history of alcohol use,  check procalcitonin  Acute pancreatitis -Presents with abdominal pain, CT findings consistent with acute pancreatitis. -He has been sober for alcohol for 2 years, does reports like fatty diet -Right upper  quadrant ultrasound showed prior cholecystectomy -Triglyceride not elevated. - IgG4 at 31.  -continue to have ab pain, lipase trend up, keep on clears, may have to back down on diet, will monitor  Hypoglycemia event Due to n.p.o. On hypoglycemia protocol Advance diet as tolerated  Ileus/Constipation;  -Report passing gas, ab pain has improved, no nausea no vomiting -Advance diet as tolerated ,encourage activity, start stool softener  Hypokalemia/hypomagnesemia, replace K and  mag  COPD, baseline not oxygen dependent No wheezing on exam  Low back pain MRI from yesterday showed progressive degenerative changes. Patient need to follow-up with  Dr [name] , has appointment 01/04/2021 PT recommends outpatient PT   DVT prophylaxis: enoxaparin (LOVENOX) injection 40 mg Start: 12/30/20 2130   Code Status: Full Family Communication: Patient Disposition:   Status is: Inpatient   Dispo: The patient is from: Home              Anticipated d/c is to: Home              Anticipated d/c date is: To be determined, need to able to tolerate diet advancement                Consultants:   None  Procedures:   None  Antimicrobials:   Flagyl/Rocephin     Objective: Vitals:   01/02/21 2101 01/02/21 2344 01/03/21 0354 01/03/21 0715  BP: 134/81 114/76 116/72 116/81  Pulse: 81 84 70 66  Resp: _0 Temp: (!) 100.5 F (38.1 C) 98.2 F (36.8 C) 98.2 F (36.8 C) 98.6 F (37  C)  TempSrc: Oral Oral Oral Oral  SpO2: 91% 96% 98% 100%  Weight:   78.3 kg   Height:        Intake/Output Summary (Last 24 hours) at 01/03/2021 0727 Last data filed at 01/03/2021 0424 Gross per 24 hour  Intake 310 ml  Output 1150 ml  Net -840 ml   Filed Weights   01/01/21 0405 01/02/21 0402 01/03/21 0354  Weight: 73.8 kg 76.1 kg 78.3 kg    Examination:  General exam: calm, NAD Respiratory system: Clear to auscultation. Respiratory effort normal. Cardiovascular system: S1 & S2 heard, RRR. No  JVD, no murmur, No pedal edema. Gastrointestinal system: Abdomen is nondistended, soft and nontender. Normal bowel sounds heard. Well healed midline surgical scan Central nervous system: Alert and oriented. No focal neurological deficits. Extremities: Symmetric 5 x 5 power. Skin: No rashes, lesions or ulcers Psychiatry: Judgement and insight appear normal. Mood & affect appropriate.     Data Reviewed: I have personally reviewed following labs and imaging studies  CBC: Recent Labs  Lab 12/30/20 1306 12/31/20 0340 01/01/21 0351 01/02/21 0419 01/03/21 0350  WBC 10.0 10.0 10.9* 13.3* 15.9*  NEUTROABS  --   --   --   --  10.7*  HGB 17.3* 15.5 14.8 14.6 14.5  HCT 49.5 46.8 44.5 40.6 42.1  MCV 91.3 95.5 94.5 90.6 91.1  PLT 321 246 246 240 742    Basic Metabolic Panel: Recent Labs  Lab 12/30/20 1306 12/31/20 0340 01/01/21 0351 01/02/21 0419 01/03/21 0350  NA 135 136 135 134* 134*  K 3.8 3.7 4.0 3.1* 3.4*  CL 101 107 104 103 101  CO2 22 19* 19* 22 23  GLUCOSE 102* 96 64* 107* 101*  BUN _0 <5* <5*  CREATININE 1.15 1.00 0.97 1.01 1.03  CALCIUM 9.1 8.1* 8.3* 8.1* 8.2*  MG  --   --   --   --  1.6*    GFR: Estimated Creatinine Clearance: 78.4 mL/min (by C-G formula based on SCr of 1.03 mg/dL).  Liver Function Tests: Recent Labs  Lab 12/30/20 1306 12/31/20 0340 01/01/21 0351 01/02/21 0419 01/03/21 0350  AST _1 ALT _2 ALKPHOS 77 61 58 57 57  BILITOT 1.1 1.3* 1.8* 1.5* 1.5*  PROT 7.9 6.3* 6.4* 5.8* 6.0*  ALBUMIN 3.9 3.2* 3.1* 2.7* 2.6*    CBG: Recent Labs  Lab 01/01/21 2325 01/02/21 0608 01/02/21 1236 01/02/21 2345 01/03/21 0601  GLUCAP 116* 128* 128* 134* 96     Recent Results (from the past 240 hour(s))  SARS CORONAVIRUS 2 (TAT 6-24 HRS) Nasopharyngeal Nasopharyngeal Swab     Status: None   Collection Time: 12/30/20 10:34 PM   Specimen: Nasopharyngeal Swab  Result Value Ref Range Status   SARS Coronavirus 2 NEGATIVE  NEGATIVE Final    Comment: (NOTE) SARS-CoV-2 target nucleic acids are NOT DETECTED.  The SARS-CoV-2 RNA is generally detectable in upper and lower respiratory specimens during the acute phase of infection. Negative results do not preclude SARS-CoV-2 infection, do not rule out co-infections with other pathogens, and should not be used as the sole basis for treatment or other patient management decisions. Negative results must be combined with clinical observations, patient history, and epidemiological information. The expected result is Negative.  Fact Sheet for Patients: SugarRoll.be  Fact Sheet for Healthcare Providers: https://www.woods-mathews.com/  This test is not yet approved or cleared by the [place] FDA and  has been  authorized for detection and/or diagnosis of SARS-CoV-2 by FDA under an Emergency Use Authorization (EUA). This EUA will remain  in effect (meaning this test can be used) for the duration of the COVID-19 declaration under Se ction 564(b)(1) of the Act, 21 U.S.C. section 360bbb-3(b)(1), unless the authorization is terminated or revoked sooner.  Performed at [facility], [place] [address], [place], [place] [address]   Culture, blood (routine x 2)     Status: None (Preliminary result)   Collection Time: 01/01/21  9:58 PM   Specimen: BLOOD RIGHT HAND  Result Value Ref Range Status   Specimen Description BLOOD RIGHT HAND  Final   Special Requests   Final    BOTTLES DRAWN AEROBIC ONLY Blood Culture adequate volume   Culture   Final    NO GROWTH < 12 HOURS Performed at [facility], [place] [address], [place], [place] [address]    Report Status PENDING  Incomplete  Culture, blood (routine x 2)     Status: None (Preliminary result)   Collection Time: 01/01/21  9:58 PM   Specimen: BLOOD LEFT HAND  Result Value Ref Range Status   Specimen Description BLOOD LEFT HAND  Final   Special Requests   Final     BOTTLES DRAWN AEROBIC ONLY Blood Culture adequate volume   Culture   Final    NO GROWTH < 12 HOURS Performed at [facility], [place] [address] [place], [place] [address]    Report Status PENDING  Incomplete  SARS Coronavirus 2 by RT PCR (hospital order, performed in [facility] hospital lab) Nasopharyngeal Nasopharyngeal Swab     Status: None   Collection Time: 01/02/21  1:13 PM   Specimen: Nasopharyngeal Swab  Result Value Ref Range Status   SARS Coronavirus 2 NEGATIVE NEGATIVE Final    Comment: (NOTE) SARS-CoV-2 target nucleic acids are NOT DETECTED.  The SARS-CoV-2 RNA is generally detectable in upper and lower respiratory specimens during the acute phase of infection. The lowest concentration of SARS-CoV-2 viral copies this assay can detect is 250 copies / mL. A negative result does not preclude SARS-CoV-2 infection and should not be used as the sole basis for treatment or other patient management decisions.  A negative result may occur with improper specimen collection / handling, submission of specimen other than nasopharyngeal swab, presence of viral mutation(s) within the areas targeted by this assay, and inadequate number of viral copies (<250 copies / mL). A negative result must be combined with clinical observations, patient history, and epidemiological information.  Fact Sheet for Patients:   StrictlyIdeas.no  Fact Sheet for Healthcare Providers: BankingDealers.co.za  This test is not yet approved or  cleared by the [place] FDA and has been authorized for detection and/or diagnosis of SARS-CoV-2 by FDA under an Emergency Use Authorization (EUA).  This EUA will remain in effect (meaning this test can be used) for the duration of the COVID-19 declaration under Section 564(b)(1) of the Act, 21 U.S.C. section 360bbb-3(b)(1), unless the authorization is terminated or revoked sooner.  Performed at [facility], [place] [address] [place], [place] [address]          Radiology Studies: DG Chest 1 View  Result Date: 01/01/2021 CLINICAL DATA:  Pneumonia, left chest pain with inspiration EXAM: CHEST  1 VIEW COMPARISON:  Radiograph 04/10/2019 FINDINGS: Poor inspiratory effort. Some more patchy and heterogeneous areas of mixed airspace and interstitial opacity are present in the mid to lower lungs.  More focal opacity is also present in the retrocardiac space. No pneumothorax. No visible effusion. The cardiomediastinal contours are unremarkable. No acute osseous or soft tissue abnormality. Cholecystectomy clips in the right upper quadrant. IMPRESSION: Airspace opacity in the mid to lower lungs compatible with multifocal pneumonia including atypical viral etiologies. Electronically Signed   By: Lovena Le M.D.   On: 01/01/2021 21:34   DG Abd 1 View  Result Date: 01/01/2021 CLINICAL DATA:  Constipation and nausea.  Acute pancreatitis. EXAM: ABDOMEN - 1 VIEW COMPARISON:  CT abdomen and pelvis 12/30/2020 FINDINGS: There is mild gaseous distension of loops of colon and to a lesser extent small bowel. A moderate amount of stool is noted in the colon. Cholecystectomy clips are noted, and there is a retained metallic projectile in the right pelvis. IMPRESSION: Mild gaseous bowel distension which may reflect mild ileus. Electronically Signed   By: Logan Bores M.D.   On: 01/01/2021 10:20        Scheduled Meds: . bisacodyl  10 mg Rectal Once  . enoxaparin (LOVENOX) injection  40 mg Subcutaneous Q24H  . pantoprazole (PROTONIX) IV  40 mg Intravenous Q12H  . polyethylene glycol  17 g Oral Daily  . potassium chloride  40 mEq Oral Once  . senna-docusate  1 tablet Oral BID   Continuous Infusions: . cefTRIAXone (ROCEPHIN)  IV 1 g (01/02/21 1559)  . famotidine (PEPCID) IV 20 mg (01/02/21 2235)  . magnesium sulfate bolus IVPB    . metronidazole 500 mg (01/03/21 0547)     LOS: 4 days   Time spent:  61mns Greater than 50% of this time was spent in counseling, explanation of diagnosis, planning of further management, and coordination of care.  I have personally reviewed and interpreted on  01/03/2021 daily labs,  imagings as discussed above under date review session and assessment and plans.  I reviewed all nursing notes, pharmacy notes,   vitals, pertinent old records  I have discussed plan of care as described above with RN , patient on 01/03/2021  Voice Recognition /Dragon dictation system was used to create this note, attempts have been made to correct errors. Please contact the author with questions and/or clarifications.   FFlorencia Reasons MD PhD FACP [facility]  Available via Epic secure chat 7am-7pm for nonurgent issues Please page for urgent issues To page the attending provider between 7A-7P or the covering provider during after hours 7P-7A, please log into the web site [email or web address] and access using universal [place] password for that web site. If you do not have the password, please call the hospital operator.    01/03/2021, 7:27 AM

## 2021-01-04 ENCOUNTER — Inpatient Hospital Stay (HOSPITAL_COMMUNITY): Payer: Self-pay

## 2021-01-04 ENCOUNTER — Ambulatory Visit: Payer: Medicaid Other | Admitting: Orthopaedic Surgery

## 2021-01-04 LAB — CBC WITH DIFFERENTIAL/PLATELET
Abs Immature Granulocytes: 0.07 10*3/uL (ref 0.00–0.07)
Basophils Absolute: 0.1 10*3/uL (ref 0.0–0.1)
Basophils Relative: 0 %
Eosinophils Absolute: 0.4 10*3/uL (ref 0.0–0.5)
Eosinophils Relative: 2 %
HCT: 40.7 % (ref 39.0–52.0)
Hemoglobin: 14 g/dL (ref 13.0–17.0)
Immature Granulocytes: 0 %
Lymphocytes Relative: 12 %
Lymphs Abs: 2 10*3/uL (ref 0.7–4.0)
MCH: 31.4 pg (ref 26.0–34.0)
MCHC: 34.4 g/dL (ref 30.0–36.0)
MCV: 91.3 fL (ref 80.0–100.0)
Monocytes Absolute: 2.9 10*3/uL — ABNORMAL HIGH (ref 0.1–1.0)
Monocytes Relative: 17 %
Neutro Abs: 11.5 10*3/uL — ABNORMAL HIGH (ref 1.7–7.7)
Neutrophils Relative %: 69 %
Platelets: 271 10*3/uL (ref 150–400)
RBC: 4.46 MIL/uL (ref 4.22–5.81)
RDW: 12.4 % (ref 11.5–15.5)
WBC: 17 10*3/uL — ABNORMAL HIGH (ref 4.0–10.5)
nRBC: 0 % (ref 0.0–0.2)

## 2021-01-04 LAB — BASIC METABOLIC PANEL
Anion gap: 9 (ref 5–15)
BUN: <5 mg/dL — ABNORMAL LOW (ref 6–20)
CO2: 22 mmol/L (ref 22–32)
Calcium: 8.3 mg/dL — ABNORMAL LOW (ref 8.9–10.3)
Chloride: 101 mmol/L (ref 98–111)
Creatinine, Ser: 1.04 mg/dL (ref 0.61–1.24)
GFR, Estimated: >60 mL/min (ref >60)
Glucose, Bld: 99 mg/dL (ref 70–99)
Potassium: 3.4 mmol/L — ABNORMAL LOW (ref 3.5–5.1)
Sodium: 132 mmol/L — ABNORMAL LOW (ref 135–145)

## 2021-01-04 LAB — GLUCOSE, CAPILLARY
Glucose-Capillary: 103 mg/dL — ABNORMAL HIGH (ref 70–99)
Glucose-Capillary: 103 mg/dL — ABNORMAL HIGH (ref 70–99)
Glucose-Capillary: 91 mg/dL (ref 70–99)

## 2021-01-04 LAB — PROCALCITONIN: Procalcitonin: 0.14 ng/mL

## 2021-01-04 LAB — MAGNESIUM: Magnesium: 2 mg/dL (ref 1.7–2.4)

## 2021-01-04 LAB — LIPASE, BLOOD: Lipase: 103 U/L — ABNORMAL HIGH (ref 11–51)

## 2021-01-04 MED ORDER — SODIUM CHLORIDE 0.9 % IV SOLN: INTRAVENOUS | Status: AC

## 2021-01-04 MED ORDER — IOHEXOL 9 MG/ML PO SOLN: 500.0000 mL | ORAL | Status: AC

## 2021-01-04 MED ORDER — IOHEXOL 300 MG/ML  SOLN
100.0000 mL | Freq: Once | INTRAMUSCULAR | Status: AC | PRN
  Administered 2021-01-04: 100 mL via INTRAVENOUS

## 2021-01-04 NOTE — Progress Notes (Signed)
Occupational Therapy Treatment Patient Details Name: Wesley [name] MRN: [id number] DOB: [birth year] Today's Date: 01/04/2021    History of present illness 55 year old with past medical history significant for COPD, chronic low back pain with sciatica, history of alcohol abuse with a strict abstinence for 2 years now who presents to the emergency department complaining of severe upper abdominal pain and low back pain. Pt with acute pancreatitis. Of note: pt just had MRI yesterday for worsening LBP, showed moderate spinal stenosis L5 worsened from MRI 2020.   OT comments  OT treatment session with focus on self-care re-education, ADL transfers, and continued education on back precautions for pain management. Patient completed bathing at shower level in sitting and dressing with good carryover of compensatory strategies for pain management with supervision A. Patient making great progress toward goals with continued recommendation for return home with no OT follow-up. Patient's significant other available to assist with IADLs upon return home.    Follow Up Recommendations  No OT follow up    Equipment Recommendations  3 in 1 bedside commode    Recommendations for Other Services      Precautions / Restrictions Precautions Precautions: Back (For pain management) Precaution Booklet Issued: No Precaution Comments: Verbally reviewed precautions. Patient only able to recall 1/3. Requires continued education. Restrictions Weight Bearing Restrictions: No       Mobility Bed Mobility Overal bed mobility: Needs Assistance Bed Mobility: Rolling;Sidelying to Sit Rolling: Modified independent (Device/Increase time)         General bed mobility comments: Min I with increased time 2/2 pain. Good recall of log rolling technique.  Transfers Overall transfer level: Needs assistance Equipment used: Rolling walker (2 wheeled) Transfers: Sit to/from Stand Sit to Stand: Supervision          General transfer comment: Supervision A for sit to stand x3 during ADLs.    Balance Overall balance assessment: Mild deficits observed, not formally tested                                         ADL either performed or assessed with clinical judgement   ADL Overall ADL's : Needs assistance/impaired         Upper Body Bathing: Supervision/ safety;Sitting   Lower Body Bathing: Supervison/ safety;Sit to/from stand Lower Body Bathing Details (indicate cue type and reason): Seated on BSC in shower Upper Body Dressing : Supervision/safety;Sitting   Lower Body Dressing: Supervision/safety;Sit to/from stand Lower Body Dressing Details (indicate cue type and reason): Good recall of back precautions for pain management.             Functional mobility during ADLs: Supervision/safety;Rolling walker General ADL Comments: Patient functioning at overall Min A including shower/toilet transfers with use of RW.     Vision   Vision Assessment?: No apparent visual deficits   Perception     Praxis      Cognition Arousal/Alertness: Awake/alert Behavior During Therapy: WFL for tasks assessed/performed Overall Cognitive Status: Impaired/Different from baseline Area of Impairment: Memory                     Memory: Decreased short-term memory         General Comments: Decreased ability to recall back precautions after education.        Exercises     Shoulder Instructions       General Comments  Pertinent Vitals/ Pain       Pain Assessment: Faces Faces Pain Scale: Hurts little more Pain Location: L side of abdomen and back Pain Descriptors / Indicators: Grimacing;Guarding;Aching Pain Intervention(s): Monitored during session;Repositioned  Home Living                                          Prior Functioning/Environment              Frequency  Min 2X/week        Progress Toward Goals  OT Goals(current  goals can now be found in the care plan section)  Progress towards OT goals: Progressing toward goals  Acute Rehab OT Goals Patient Stated Goal: To decrease pain. OT Goal Formulation: With patient Time For Goal Achievement: 01/16/21 Potential to Achieve Goals: Good ADL Goals Pt Will Perform Grooming: with modified independence;standing Pt Will Perform Lower Body Dressing: with modified independence;sit to/from stand;with adaptive equipment Pt Will Transfer to Toilet: with modified independence;ambulating;bedside commode Pt Will Perform Toileting - Clothing Manipulation and hygiene: with modified independence;sit to/from stand  Plan Discharge plan remains appropriate;Frequency remains appropriate    Co-evaluation                 AM-PAC OT "6 Clicks" Daily Activity     Outcome Measure   Help from another person eating meals?: None Help from another person taking care of personal grooming?: A Little Help from another person toileting, which includes using toliet, bedpan, or urinal?: A Little Help from another person bathing (including washing, rinsing, drying)?: A Little Help from another person to put on and taking off regular upper body clothing?: None Help from another person to put on and taking off regular lower body clothing?: A Little 6 Click Score: 20    End of Session Equipment Utilized During Treatment: Gait belt;Rolling walker      Activity Tolerance Patient tolerated treatment well   Patient Left in chair;with call bell/phone within reach;with chair alarm set   Nurse Communication          Time: 1250-1319 OT Time Calculation (min): 29 min  Charges: OT General Charges $OT Visit: 1 Visit OT Treatments $Self Care/Home Management : 23-37 mins  Crystalle Popwell H. OTR/L Supplemental OT, Department of rehab services [phone]   Sharlot Sturkey R H. 01/04/2021, 2:09 PM

## 2021-01-04 NOTE — Progress Notes (Signed)
Physical Therapy Treatment Patient Details Name: Wesley [name] MRN: [id number] DOB: Sep 03, 1966 Today's Date: 01/04/2021    History of Present Illness 55 year old with past medical history significant for COPD, chronic low back pain with sciatica, history of alcohol abuse with a strict abstinence for 2 years now who presents to the emergency department complaining of severe upper abdominal pain and low back pain. Pt with acute pancreatitis. Of note: pt just had MRI yesterday for worsening LBP, showed moderate spinal stenosis L5 worsened from MRI 2020.    PT Comments    Pt continues to be limited with mobility by pain in his lower back. Cued pt to improve posture and speed with gait, with min changes noted. He only requires min guard for gait this date with a RW to relieve his pain, but does display balance deficits that impact his safety and place him at risk for falls. He demonstrates a trunk sway when he has no UE support with static standing, which could be secondary to his pain also. Will continue to follow acutely. Current recommendations remain appropriate.   Follow Up Recommendations  Outpatient PT (for low back pain)     Equipment Recommendations  None recommended by PT    Recommendations for Other Services       Precautions / Restrictions Precautions Precautions: Back (for comfort) Precaution Booklet Issued: No Precaution Comments: Reviewed precautions for comfort Restrictions Weight Bearing Restrictions: No    Mobility  Bed Mobility Overal bed mobility: Needs Assistance Bed Mobility: Rolling;Sidelying to Sit Rolling: Supervision Sidelying to sit: Supervision;HOB elevated       General bed mobility comments: Supervision for bed mob for safety, requiring extra time due to pain.  Transfers Overall transfer level: Needs assistance Equipment used: Rolling walker (2 wheeled) Transfers: Sit to/from Stand Sit to Stand: Supervision         General transfer comment:  Supervision and extra time to come to stand from EOB > RW.  Ambulation/Gait Ambulation/Gait assistance: Min guard Gait Distance (Feet): 200 Feet Assistive device: Rolling walker (2 wheeled);Straight cane Gait Pattern/deviations: Step-through pattern;Decreased step length - right;Decreased step length - left;Decreased stride length;Trunk flexed Gait velocity: decreased Gait velocity interpretation: <1.31 ft/sec, indicative of household ambulator General Gait Details: Pt ambulates slowly with mild unsteadiness with RW, utilizing UEs with trunk flexed to unweight back and relieve pain. No overt LOB, min guard for safety. Cued pt to improve posture, clear feet, and increase gait speed as tolerated.   Stairs             Wheelchair Mobility    Modified Rankin (Stroke Patients Only)       Balance Overall balance assessment: Mild deficits observed, not formally tested (able to stand without UE support to urinate at toilet, min guard for safety)                                          Cognition Arousal/Alertness: Awake/alert Behavior During Therapy: WFL for tasks assessed/performed Overall Cognitive Status: Within Functional Limits for tasks assessed                                        Exercises      General Comments        Pertinent Vitals/Pain Pain Assessment: 0-10 Pain Score: 8  Pain Location: back Pain Descriptors / Indicators: Grimacing;Guarding;Moaning;Sharp Pain Intervention(s): Limited activity within patient's tolerance;Monitored during session;Repositioned;Patient requesting pain meds-RN notified    Home Living                      Prior Function            PT Goals (current goals can now be found in the care plan section) Acute Rehab PT Goals Patient Stated Goal: decreased pain PT Goal Formulation: With patient Time For Goal Achievement: 01/15/21 Potential to Achieve Goals: Good Progress towards PT goals:  Progressing toward goals    Frequency    Min 3X/week      PT Plan Current plan remains appropriate    Co-evaluation              AM-PAC PT "6 Clicks" Mobility   Outcome Measure  Help needed turning from your back to your side while in a flat bed without using bedrails?: None Help needed moving from lying on your back to sitting on the side of a flat bed without using bedrails?: A Little Help needed moving to and from a bed to a chair (including a wheelchair)?: A Little Help needed standing up from a chair using your arms (e.g., wheelchair or bedside chair)?: A Little Help needed to walk in hospital room?: A Little Help needed climbing 3-5 steps with a railing? : A Little 6 Click Score: 19    End of Session Equipment Utilized During Treatment: Gait belt Activity Tolerance: Patient limited by pain;Patient tolerated treatment well Patient left: with call bell/phone within reach;in bed;with bed alarm set;with nursing/sitter in room Nurse Communication: Mobility status;Patient requests pain meds;Other (comment) (pt states feeling cold) PT Visit Diagnosis: Unsteadiness on feet (R26.81);Pain;Other abnormalities of gait and mobility (R26.89);Difficulty in walking, not elsewhere classified (R26.2) Pain - part of body:  (back)     Time: 8366-2947 PT Time Calculation (min) (ACUTE ONLY): 25 min  Charges:  $Gait Training: 23-37 mins                     Moishe Spice, PT, DPT Acute Rehabilitation Services  Pager: [phone] Office: [place] 01/04/2021, 5:24 PM

## 2021-01-04 NOTE — Progress Notes (Signed)
OT Cancellation Note  Patient Details Name: Wesley [name] MRN: [id number] DOB: 1966/07/03   Cancelled Treatment:    Reason Eval/Treat Not Completed: Other (comment) patient awaiting an important phone call. OT to check back as time allows.   Gloris Manchester OTR/L Supplemental OT, Department of rehab services [phone]  Carlson Belland R H. 01/04/2021, 9:58 AM

## 2021-01-04 NOTE — Progress Notes (Signed)
PROGRESS NOTE    Wesley [name]  CZY:[id number] DOB: 06-19-1966 DOA: 12/30/2020 PCP: [name], MD    Chief Complaint  Patient presents with  . Abdominal Pain  . Back Pain    Brief Narrative:  55 year old with past medical history significant for COPD, chronic low back pain with sciatica, history of alcohol abuse with  strict abstinence for 2 years now who presents to the emergency department complaining of severe upper abdominal pain and low back pain.  He follows with orthopedic surgery for his low back pain.  He had MRI  12/29/2020 with progression and is a scheduled to follow-up on that with Dr. [name] but he developed worsening severe upper abdominal pain and presented to the ED.  Evaluation in the ED he was found to have a lipase elevated at 87, CT abdomen and pelvis showed peripancreatic fat stranding without focal fluid collection or necrosis.  Subjective:  Continue spike fever, continue to have left sided ab pain, feeling nauseous, no vomiting  Had bm   Assessment & Plan:   Principal Problem:   Acute pancreatitis Active Problems:   COPD with chronic bronchitis (HCC)   Alcohol dependence in remission (HCC)   Chronic midline low back pain with bilateral sciatica  Pneumonia? -He developed fever 101.8 (2/1-2/2) night  -chest x-ray showed bilateral lower lobe infiltrate, with cough, and sore throat -Denies nausea vomiting, denies dysphagia -Repeat Covid testing negative -Started Rocephin and Flagyl for possible aspiration pneumonia due to history of alcohol use,   procalcitonin <0.1-0.14  Acute pancreatitis -Presents with abdominal pain, CT findings consistent with acute pancreatitis. -He has been sober for alcohol for 2 years, does reports like fatty diet -Right upper quadrant ultrasound showed prior cholecystectomy -Triglyceride not elevated. - IgG4 at 31.  -continue to have ab pain, lipase trend up, keep on clears, repeat ct ab/pel  Hypoglycemia event Due  to n.p.o. On hypoglycemia protocol Advance diet as tolerated  Ileus/Constipation;  -Report passing gas, ab pain has improved, no nausea no vomiting -Advance diet as tolerated ,encourage activity, start stool softener  Hypokalemia/hypomagnesemia, replace K and  mag  COPD, baseline not oxygen dependent No wheezing on exam  Low back pain MRI from yesterday showed progressive degenerative changes. Patient need to follow-up with  Dr [name] , has appointment 01/04/2021 PT recommends outpatient PT   DVT prophylaxis: enoxaparin (LOVENOX) injection 40 mg Start: 12/30/20 2130   Code Status: Full Family Communication: Patient Disposition:   Status is: Inpatient   Dispo: The patient is from: Home              Anticipated d/c is to: Home              Anticipated d/c date is: To be determined, need to able to tolerate diet advancement, continue to have fever                Consultants:   None  Procedures:   None  Antimicrobials:   Flagyl/Rocephin     Objective: Vitals:   01/04/21 0030 01/04/21 0200 01/04/21 0443 01/04/21 0732  BP: 127/81  114/87 121/84  Pulse: 89  79 76  Resp: _0 Temp: (!) 101.9 F (38.8 C) 98.8 F (37.1 C) 98.4 F (36.9 C) 98 F (36.7 C)  TempSrc: Oral  Oral Oral  SpO2: 95%  97% 98%  Weight:   73.1 kg   Height:        Intake/Output Summary (Last 24 hours) at 01/04/2021  [place] filed at 01/04/2021 1246 Gross per 24 hour  Intake --  Output 1950 ml  Net -1950 ml   Filed Weights   01/02/21 0402 01/03/21 0354 01/04/21 0443  Weight: 76.1 kg 78.3 kg 73.1 kg    Examination:  General exam: calm, NAD Respiratory system: Clear to auscultation. Respiratory effort normal. Cardiovascular system: S1 & S2 heard, RRR. No JVD, no murmur, No pedal edema. Gastrointestinal system: Abdomen is nondistended, soft and nontender. Normal bowel sounds heard. Well healed midline surgical scan Central nervous system: Alert and oriented. No focal  neurological deficits. Extremities: Symmetric 5 x 5 power. Skin: No rashes, lesions or ulcers Psychiatry: Judgement and insight appear normal. Mood & affect appropriate.     Data Reviewed: I have personally reviewed following labs and imaging studies  CBC: Recent Labs  Lab 12/31/20 0340 01/01/21 0351 01/02/21 0419 01/03/21 0350 01/04/21 0413  WBC 10.0 10.9* 13.3* 15.9* 17.0*  NEUTROABS  --   --   --  10.7* 11.5*  HGB 15.5 14.8 14.6 14.5 14.0  HCT 46.8 44.5 40.6 42.1 40.7  MCV 95.5 94.5 90.6 91.1 91.3  PLT 246 246 240 235 269    Basic Metabolic Panel: Recent Labs  Lab 12/31/20 0340 01/01/21 0351 01/02/21 0419 01/03/21 0350 01/04/21 0413  NA 136 135 134* 134* 132*  K 3.7 4.0 3.1* 3.4* 3.4*  CL 107 104 103 101 101  CO2 19* 19* _0 GLUCOSE 96 64* 107* 101* 99  BUN 8 7 <5* <5* <5*  CREATININE 1.00 0.97 1.01 1.03 1.04  CALCIUM 8.1* 8.3* 8.1* 8.2* 8.3*  MG  --   --   --  1.6* 2.0    GFR: Estimated Creatinine Clearance: 77.6 mL/min (by C-G formula based on SCr of 1.04 mg/dL).  Liver Function Tests: Recent Labs  Lab 12/30/20 1306 12/31/20 0340 01/01/21 0351 01/02/21 0419 01/03/21 0350  AST _1 ALT _2 ALKPHOS 77 61 58 57 57  BILITOT 1.1 1.3* 1.8* 1.5* 1.5*  PROT 7.9 6.3* 6.4* 5.8* 6.0*  ALBUMIN 3.9 3.2* 3.1* 2.7* 2.6*    CBG: Recent Labs  Lab 01/03/21 0601 01/03/21 1155 01/03/21 1748 01/04/21 0030 01/04/21 0608  GLUCAP 96 114* 104* 91 103*     Recent Results (from the past 240 hour(s))  SARS CORONAVIRUS 2 (TAT 6-24 HRS) Nasopharyngeal Nasopharyngeal Swab     Status: None   Collection Time: 12/30/20 10:34 PM   Specimen: Nasopharyngeal Swab  Result Value Ref Range Status   SARS Coronavirus 2 NEGATIVE NEGATIVE Final    Comment: (NOTE) SARS-CoV-2 target nucleic acids are NOT DETECTED.  The SARS-CoV-2 RNA is generally detectable in upper and lower respiratory specimens during the acute phase of infection.  Negative results do not preclude SARS-CoV-2 infection, do not rule out co-infections with other pathogens, and should not be used as the sole basis for treatment or other patient management decisions. Negative results must be combined with clinical observations, patient history, and epidemiological information. The expected result is Negative.  Fact Sheet for Patients: SugarRoll.be  Fact Sheet for Healthcare Providers: https://www.woods-mathews.com/  This test is not yet approved or cleared by the [place] FDA and  has been authorized for detection and/or diagnosis of SARS-CoV-2 by FDA under an Emergency Use Authorization (EUA). This EUA will remain  in effect (meaning this test can be used) for the duration of the COVID-19 declaration under Se ction 564(b)(1) of the  Act, 21 U.S.C. section 360bbb-3(b)(1), unless the authorization is terminated or revoked sooner.  Performed at [facility], [place] [address], [place], [place] [address]   Culture, blood (routine x 2)     Status: None (Preliminary result)   Collection Time: 01/01/21  9:58 PM   Specimen: BLOOD RIGHT HAND  Result Value Ref Range Status   Specimen Description BLOOD RIGHT HAND  Final   Special Requests   Final    BOTTLES DRAWN AEROBIC ONLY Blood Culture adequate volume   Culture   Final    NO GROWTH 3 DAYS Performed at [facility], [place] [address], [place], [place] [address]    Report Status PENDING  Incomplete  Culture, blood (routine x 2)     Status: None (Preliminary result)   Collection Time: 01/01/21  9:58 PM   Specimen: BLOOD LEFT HAND  Result Value Ref Range Status   Specimen Description BLOOD LEFT HAND  Final   Special Requests   Final    BOTTLES DRAWN AEROBIC ONLY Blood Culture adequate volume   Culture   Final    NO GROWTH 3 DAYS Performed at [facility], [place] [address], [place], [place] [address]    Report Status PENDING  Incomplete   Urine Culture     Status: None   Collection Time: 01/02/21  7:30 AM   Specimen: Urine, Random  Result Value Ref Range Status   Specimen Description URINE, RANDOM  Final   Special Requests NONE  Final   Culture   Final    NO GROWTH Performed at [facility], [address], [place], [place] [address]    Report Status 01/03/2021 FINAL  Final  SARS Coronavirus 2 by RT PCR (hospital order, performed in [facility] hospital lab) Nasopharyngeal Nasopharyngeal Swab     Status: None   Collection Time: 01/02/21  1:13 PM   Specimen: Nasopharyngeal Swab  Result Value Ref Range Status   SARS Coronavirus 2 NEGATIVE NEGATIVE Final    Comment: (NOTE) SARS-CoV-2 target nucleic acids are NOT DETECTED.  The SARS-CoV-2 RNA is generally detectable in upper and lower respiratory specimens during the acute phase of infection. The lowest concentration of SARS-CoV-2 viral copies this assay can detect is 250 copies / mL. A negative result does not preclude SARS-CoV-2 infection and should not be used as the sole basis for treatment or other patient management decisions.  A negative result may occur with improper specimen collection / handling, submission of specimen other than nasopharyngeal swab, presence of viral mutation(s) within the areas targeted by this assay, and inadequate number of viral copies (<250 copies / mL). A negative result must be combined with clinical observations, patient history, and epidemiological information.  Fact Sheet for Patients:   StrictlyIdeas.no  Fact Sheet for Healthcare Providers: BankingDealers.co.za  This test is not yet approved or  cleared by the [place] FDA and has been authorized for detection and/or diagnosis of SARS-CoV-2 by FDA under an Emergency Use Authorization (EUA).  This EUA will remain in effect (meaning this test can be used) for the duration of the COVID-19 declaration under Section  564(b)(1) of the Act, 21 U.S.C. section 360bbb-3(b)(1), unless the authorization is terminated or revoked sooner.  Performed at [facility], [place] [address], [place], [place] [address]          Radiology Studies: No results found.      Scheduled Meds: . bisacodyl  10 mg Rectal Once  .  enoxaparin (LOVENOX) injection  40 mg Subcutaneous Q24H  . guaiFENesin  600 mg Oral BID  . pantoprazole (PROTONIX) IV  40 mg Intravenous Q12H  . polyethylene glycol  17 g Oral BID  . senna-docusate  1 tablet Oral BID   Continuous Infusions: . sodium chloride    . cefTRIAXone (ROCEPHIN)  IV 1 g (01/03/21 1348)  . famotidine (PEPCID) IV 20 mg (01/04/21 0835)  . metronidazole 500 mg (01/04/21 0552)     LOS: 5 days   Time spent: 51mns Greater than 50% of this time was spent in counseling, explanation of diagnosis, planning of further management, and coordination of care.  I have personally reviewed and interpreted on  01/04/2021 daily labs,  imagings as discussed above under date review session and assessment and plans.  I reviewed all nursing notes, pharmacy notes,   vitals, pertinent old records  I have discussed plan of care as described above with RN , patient on 01/04/2021  Voice Recognition /Dragon dictation system was used to create this note, attempts have been made to correct errors. Please contact the author with questions and/or clarifications.   FFlorencia Reasons MD PhD FACP [facility]  Available via Epic secure chat 7am-7pm for nonurgent issues Please page for urgent issues To page the attending provider between 7A-7P or the covering provider during after hours 7P-7A, please log into the web site [email or web address] and access using universal [place] password for that web site. If you do not have the password, please call the hospital operator.    01/04/2021, 1:25 PM

## 2021-01-05 DIAGNOSIS — E440 Moderate protein-calorie malnutrition: Secondary | ICD-10-CM

## 2021-01-05 DIAGNOSIS — K862 Cyst of pancreas: Secondary | ICD-10-CM

## 2021-01-05 DIAGNOSIS — R935 Abnormal findings on diagnostic imaging of other abdominal regions, including retroperitoneum: Secondary | ICD-10-CM

## 2021-01-05 LAB — CBC WITH DIFFERENTIAL/PLATELET
Abs Immature Granulocytes: 0.04 10*3/uL (ref 0.00–0.07)
Basophils Absolute: 0.1 10*3/uL (ref 0.0–0.1)
Basophils Relative: 0 %
Eosinophils Absolute: 0.4 10*3/uL (ref 0.0–0.5)
Eosinophils Relative: 3 %
HCT: 38.8 % — ABNORMAL LOW (ref 39.0–52.0)
Hemoglobin: 13.4 g/dL (ref 13.0–17.0)
Immature Granulocytes: 0 %
Lymphocytes Relative: 14 %
Lymphs Abs: 1.9 10*3/uL (ref 0.7–4.0)
MCH: 31.2 pg (ref 26.0–34.0)
MCHC: 34.5 g/dL (ref 30.0–36.0)
MCV: 90.4 fL (ref 80.0–100.0)
Monocytes Absolute: 2.4 10*3/uL — ABNORMAL HIGH (ref 0.1–1.0)
Monocytes Relative: 18 %
Neutro Abs: 8.8 10*3/uL — ABNORMAL HIGH (ref 1.7–7.7)
Neutrophils Relative %: 65 %
Platelets: 280 10*3/uL (ref 150–400)
RBC: 4.29 MIL/uL (ref 4.22–5.81)
RDW: 12.4 % (ref 11.5–15.5)
WBC: 13.6 10*3/uL — ABNORMAL HIGH (ref 4.0–10.5)
nRBC: 0 % (ref 0.0–0.2)

## 2021-01-05 LAB — LIPASE, BLOOD: Lipase: 117 U/L — ABNORMAL HIGH (ref 11–51)

## 2021-01-05 LAB — COMPREHENSIVE METABOLIC PANEL
ALT: 12 U/L (ref 0–44)
AST: 15 U/L (ref 15–41)
Albumin: 2.6 g/dL — ABNORMAL LOW (ref 3.5–5.0)
Alkaline Phosphatase: 53 U/L (ref 38–126)
Anion gap: 12 (ref 5–15)
BUN: 5 mg/dL — ABNORMAL LOW (ref 6–20)
CO2: 21 mmol/L — ABNORMAL LOW (ref 22–32)
Calcium: 8.2 mg/dL — ABNORMAL LOW (ref 8.9–10.3)
Chloride: 99 mmol/L (ref 98–111)
Creatinine, Ser: 1.02 mg/dL (ref 0.61–1.24)
GFR, Estimated: >60 mL/min (ref >60)
Glucose, Bld: 100 mg/dL — ABNORMAL HIGH (ref 70–99)
Potassium: 3.2 mmol/L — ABNORMAL LOW (ref 3.5–5.1)
Sodium: 132 mmol/L — ABNORMAL LOW (ref 135–145)
Total Bilirubin: 1.2 mg/dL (ref 0.3–1.2)
Total Protein: 6 g/dL — ABNORMAL LOW (ref 6.5–8.1)

## 2021-01-05 LAB — LACTIC ACID, PLASMA: Lactic Acid, Venous: 0.8 mmol/L (ref 0.5–1.9)

## 2021-01-05 LAB — MRSA PCR SCREENING: MRSA by PCR: NEGATIVE

## 2021-01-05 LAB — GLUCOSE, CAPILLARY
Glucose-Capillary: 112 mg/dL — ABNORMAL HIGH (ref 70–99)
Glucose-Capillary: 97 mg/dL (ref 70–99)
Glucose-Capillary: 99 mg/dL (ref 70–99)

## 2021-01-05 LAB — SEDIMENTATION RATE: Sed Rate: 42 mm/hr — ABNORMAL HIGH (ref 0–16)

## 2021-01-05 LAB — MAGNESIUM: Magnesium: 1.7 mg/dL (ref 1.7–2.4)

## 2021-01-05 LAB — C-REACTIVE PROTEIN: CRP: 17.9 mg/dL — ABNORMAL HIGH (ref <1.0)

## 2021-01-05 MED ORDER — POTASSIUM CHLORIDE CRYS ER 20 MEQ PO TBCR
40.0000 meq | EXTENDED_RELEASE_TABLET | Freq: Once | ORAL | Status: AC
  Administered 2021-01-05: 40 meq via ORAL
  Filled 2021-01-05: qty 2

## 2021-01-05 MED ORDER — MAGNESIUM SULFATE 2 GM/50ML IV SOLN
2.0000 g | Freq: Once | INTRAVENOUS | Status: AC
  Administered 2021-01-05: 2 g via INTRAVENOUS
  Filled 2021-01-05: qty 50
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
## 2021-01-05 NOTE — Progress Notes (Signed)
PROGRESS NOTE    Wesley [name]  STM:[id number] DOB: 08-Jul-1966 DOA: 12/30/2020 PCP: [name], MD    Chief Complaint  Patient presents with  . Abdominal Pain  . Back Pain    Brief Narrative:  55 year old with past medical history significant for COPD, chronic low back pain with sciatica, history of alcohol abuse with  strict abstinence for 2 years now who presents to the emergency department complaining of severe upper abdominal pain and low back pain.  He follows with orthopedic surgery for his low back pain.  He had MRI  12/29/2020 with progression and is a scheduled to follow-up on that with Dr. [name] but he developed worsening severe upper abdominal pain and presented to the ED.  Evaluation in the ED he was found to have a lipase elevated at 87, CT abdomen and pelvis showed peripancreatic fat stranding without focal fluid collection or necrosis.  Subjective:   spike fever appear subsided , continue to have left sided ab pain, feeling nauseous, no vomiting Reports initially had right sided ab pain, now pain moved to the left side  Chronic back pain appear better than before  Reports stool is not formed   Assessment & Plan:   Principal Problem:   Acute pancreatitis Active Problems:   COPD with chronic bronchitis (HCC)   Alcohol dependence in remission ([place])   Chronic midline low back pain with bilateral sciatica    Acute pancreatitis -Presents with abdominal pain, CT findings consistent with acute pancreatitis. -He has been sober for alcohol for 2 years, does reports like fatty diet -Right upper quadrant ultrasound showed prior cholecystectomy -Triglyceride not elevated. - IgG4 at 31.  -continue to have ab pain, lipase trend up,  repeat ct ab/pel "Acute non-necrotizing pancreatitis, worsened in the pancreatic tail region and improved in the pancreatic head region since 12/30/2020 CT" -GI consulted   Pneumonia? -chest x-ray showed bilateral lower lobe  infiltrate, with cough, and sore throat on 2/3 -Denies nausea vomiting, denies dysphagia -Covid testing negative x2 -Started Rocephin and Flagyl for possible aspiration pneumonia due to history of alcohol use,   procalcitonin <0.1-0.14 -respiratory symptoms seems has resolved, lung exam benign, he is on room air   fever /leukocytosis He developed fever of 101.8 (2/1-2/2) night, From pneumonia? From pancreatitis? Blood culture no growth, urine culture no growth,  mrsa screening negative, Covid testing negative x2 On rocephin/flagyl Fever appear resolved, leukocytosis worsening, lactic acid 0.8 -continue rocephin/flagyl for now   Hypokalemia/hypomagnesemia, remain low, continue to replace K and  mag  Hypoglycemia event Due to n.p.o. On hypoglycemia protocol    Ileus/Constipation;  -improved   COPD, baseline not oxygen dependent No wheezing on exam  Low back pain MRI from yesterday showed progressive degenerative changes. Patient need to follow-up with  Dr [name] , missed appointment 01/04/2021 due to hospitalization PT recommends outpatient PT Reports back pain is  Better in the hospital   DVT prophylaxis: enoxaparin (LOVENOX) injection 40 mg Start: 12/30/20 2130   Code Status: Full Family Communication: Patient Disposition:   Status is: Inpatient   Dispo: The patient is from: Home              Anticipated d/c is to: Home              Anticipated d/c date is: To be determined, need to able to tolerate diet advancement, continue to have fever                Consultants:  None  Procedures:   None  Antimicrobials:   Flagyl/Rocephin     Objective: Vitals:   01/05/21 0015 01/05/21 0439 01/05/21 0500 01/05/21 0758  BP: 128/87 121/76  119/73  Pulse: 74 61  66  Resp: _0 Temp: 98.6 F (37 C) 98.4 F (36.9 C)  98.5 F (36.9 C)  TempSrc: Oral Oral  Oral  SpO2: 100% 95%  94%  Weight:   75.2 kg   Height:        Intake/Output Summary (Last 24  hours) at 01/05/2021 0920 Last data filed at 01/05/2021 0350 Gross per 24 hour  Intake 320 ml  Output 500 ml  Net -180 ml   Filed Weights   01/03/21 0354 01/04/21 0443 01/05/21 0500  Weight: 78.3 kg 73.1 kg 75.2 kg    Examination:  General exam: calm, NAD Respiratory system: Clear to auscultation. Respiratory effort normal. Cardiovascular system: S1 & S2 heard, RRR. No JVD, no murmur, No pedal edema. Gastrointestinal system: Abdomen is mildly distended, mild left side tenderness,  Normal bowel sounds heard. Well healed midline surgical scan Central nervous system: Alert and oriented. No focal neurological deficits. Extremities: Symmetric 5 x 5 power. Skin: No rashes, lesions or ulcers Psychiatry: Judgement and insight appear normal. Mood & affect appropriate.     Data Reviewed: I have personally reviewed following labs and imaging studies  CBC: Recent Labs  Lab 01/01/21 0351 01/02/21 0419 01/03/21 0350 01/04/21 0413 01/05/21 0225  WBC 10.9* 13.3* 15.9* 17.0* 13.6*  NEUTROABS  --   --  10.7* 11.5* 8.8*  HGB 14.8 14.6 14.5 14.0 13.4  HCT 44.5 40.6 42.1 40.7 38.8*  MCV 94.5 90.6 91.1 91.3 90.4  PLT 246 240 235 271 093    Basic Metabolic Panel: Recent Labs  Lab 01/01/21 0351 01/02/21 0419 01/03/21 0350 01/04/21 0413 01/05/21 0225  NA 135 134* 134* 132* 132*  K 4.0 3.1* 3.4* 3.4* 3.2*  CL 104 103 101 101 99  CO2 19* _1 21*  GLUCOSE 64* 107* 101* 99 100*  BUN 7 <5* <5* <5* 5*  CREATININE 0.97 1.01 1.03 1.04 1.02  CALCIUM 8.3* 8.1* 8.2* 8.3* 8.2*  MG  --   --  1.6* 2.0 1.7    GFR: Estimated Creatinine Clearance: 79.2 mL/min (by C-G formula based on SCr of 1.02 mg/dL).  Liver Function Tests: Recent Labs  Lab 12/31/20 0340 01/01/21 0351 01/02/21 0419 01/03/21 0350 01/05/21 0225  AST _2 ALT _3 ALKPHOS 61 58 57 57 53  BILITOT 1.3* 1.8* 1.5* 1.5* 1.2  PROT 6.3* 6.4* 5.8* 6.0* 6.0*  ALBUMIN 3.2* 3.1* 2.7* 2.6* 2.6*     CBG: Recent Labs  Lab 01/04/21 0030 01/04/21 0608 01/04/21 1812 01/05/21 0014 01/05/21 0601  GLUCAP 91 103* 103* 97 99     Recent Results (from the past 240 hour(s))  SARS CORONAVIRUS 2 (TAT 6-24 HRS) Nasopharyngeal Nasopharyngeal Swab     Status: None   Collection Time: 12/30/20 10:34 PM   Specimen: Nasopharyngeal Swab  Result Value Ref Range Status   SARS Coronavirus 2 NEGATIVE NEGATIVE Final    Comment: (NOTE) SARS-CoV-2 target nucleic acids are NOT DETECTED.  The SARS-CoV-2 RNA is generally detectable in upper and lower respiratory specimens during the acute phase of infection. Negative results do not preclude SARS-CoV-2 infection, do not rule out co-infections with other pathogens, and should not be used as the sole basis for treatment  or other patient management decisions. Negative results must be combined with clinical observations, patient history, and epidemiological information. The expected result is Negative.  Fact Sheet for Patients: SugarRoll.be  Fact Sheet for Healthcare Providers: https://www.woods-mathews.com/  This test is not yet approved or cleared by the [place] FDA and  has been authorized for detection and/or diagnosis of SARS-CoV-2 by FDA under an Emergency Use Authorization (EUA). This EUA will remain  in effect (meaning this test can be used) for the duration of the COVID-19 declaration under Se ction 564(b)(1) of the Act, 21 U.S.C. section 360bbb-3(b)(1), unless the authorization is terminated or revoked sooner.  Performed at [facility], [place] [address], [place], [place] [address]   Culture, blood (routine x 2)     Status: None (Preliminary result)   Collection Time: 01/01/21  9:58 PM   Specimen: BLOOD RIGHT HAND  Result Value Ref Range Status   Specimen Description BLOOD RIGHT HAND  Final   Special Requests   Final    BOTTLES DRAWN AEROBIC ONLY Blood Culture adequate volume    Culture   Final    NO GROWTH 3 DAYS Performed at [facility], [place] [address] [place], [place] [address]    Report Status PENDING  Incomplete  Culture, blood (routine x 2)     Status: None (Preliminary result)   Collection Time: 01/01/21  9:58 PM   Specimen: BLOOD LEFT HAND  Result Value Ref Range Status   Specimen Description BLOOD LEFT HAND  Final   Special Requests   Final    BOTTLES DRAWN AEROBIC ONLY Blood Culture adequate volume   Culture   Final    NO GROWTH 3 DAYS Performed at [facility], [place] [address] [place], [place] [address]    Report Status PENDING  Incomplete  Urine Culture     Status: None   Collection Time: 01/02/21  7:30 AM   Specimen: Urine, Random  Result Value Ref Range Status   Specimen Description URINE, RANDOM  Final   Special Requests NONE  Final   Culture   Final    NO GROWTH Performed at [facility], [address] [place], [place] [address]    Report Status 01/03/2021 FINAL  Final  SARS Coronavirus 2 by RT PCR (hospital order, performed in [facility] hospital lab) Nasopharyngeal Nasopharyngeal Swab     Status: None   Collection Time: 01/02/21  1:13 PM   Specimen: Nasopharyngeal Swab  Result Value Ref Range Status   SARS Coronavirus 2 NEGATIVE NEGATIVE Final    Comment: (NOTE) SARS-CoV-2 target nucleic acids are NOT DETECTED.  The SARS-CoV-2 RNA is generally detectable in upper and lower respiratory specimens during the acute phase of infection. The lowest concentration of SARS-CoV-2 viral copies this assay can detect is 250 copies / mL. A negative result does not preclude SARS-CoV-2 infection and should not be used as the sole basis for treatment or other patient management decisions.  A negative result may occur with improper specimen collection / handling, submission of specimen other than nasopharyngeal swab, presence of viral mutation(s) within the areas targeted by this assay, and inadequate number of viral  copies (<250 copies / mL). A negative result must be combined with clinical observations, patient history, and epidemiological information.  Fact Sheet for Patients:   StrictlyIdeas.no  Fact Sheet for Healthcare Providers: BankingDealers.co.za  This test is not yet approved or  cleared by the [place] FDA and has been  authorized for detection and/or diagnosis of SARS-CoV-2 by FDA under an Emergency Use Authorization (EUA).  This EUA will remain in effect (meaning this test can be used) for the duration of the COVID-19 declaration under Section 564(b)(1) of the Act, 21 U.S.C. section 360bbb-3(b)(1), unless the authorization is terminated or revoked sooner.  Performed at [facility], [place] [address] [place], [place] [address]   MRSA PCR Screening     Status: None   Collection Time: 01/04/21 10:26 PM   Specimen: Nasopharyngeal  Result Value Ref Range Status   MRSA by PCR NEGATIVE NEGATIVE Final    Comment:        The GeneXpert MRSA Assay (FDA approved for NASAL specimens only), is one component of a comprehensive MRSA colonization surveillance program. It is not intended to diagnose MRSA infection nor to guide or monitor treatment for MRSA infections. Performed at [facility], [place] [address], [place], [place] [address]          Radiology Studies: CT CHEST ABDOMEN PELVIS W CONTRAST  Result Date: 01/04/2021 CLINICAL DATA:  Inpatient. Follow-up pancreatitis. Chest and abdominal pain. EXAM: CT CHEST, ABDOMEN, AND PELVIS WITH CONTRAST TECHNIQUE: Multidetector CT imaging of the chest, abdomen and pelvis was performed following the standard protocol during bolus administration of intravenous contrast. CONTRAST:  140m OMNIPAQUE IOHEXOL 300 MG/ML  SOLN COMPARISON:  01/01/2021 chest radiograph. 12/30/2020 CT abdomen/pelvis. 07/23/2010 chest CT. FINDINGS: CT CHEST FINDINGS Cardiovascular: Normal heart size. No  significant pericardial effusion/thickening. Left anterior descending coronary atherosclerosis. Great vessels are normal in course and caliber. No central pulmonary emboli. Mediastinum/Nodes: No discrete thyroid nodules. Unremarkable esophagus. No right axillary adenopathy. Mildly enlarged left axillary nodes up to 1.0 cm (series 3/image 14), new since 2011 chest CT. Asymmetrically mildly prominent 0.6 cm left internal mammary node (series 3/image 23), new. Otherwise no pathologically enlarged mediastinal nodes. No hilar adenopathy. Lungs/Pleura: No pneumothorax. Small dependent bilateral pleural effusions with passive atelectasis in the dependent lower lobes. Moderate paraseptal emphysema with mild diffuse bronchial wall thickening. Mild to moderate patchy ground-glass opacities in both lungs, predominantly in the upper lobes, new from 2011 chest CT. No lung masses or significant pulmonary nodules in the aerated portions of the lungs. Musculoskeletal: Chronic ill-defined sclerosis at the first costomanubrial junctions bilaterally, similar to mildly worsened since 2011 chest CT. Mild thoracic spondylosis. Symmetric mild bilateral gynecomastia, unchanged. CT ABDOMEN PELVIS FINDINGS Hepatobiliary: Normal liver size. Simple 1.5 cm right liver dome cyst. A few additional scattered subcentimeter hypodense liver lesions are too small to characterize and are unchanged. No new liver lesions. Cholecystectomy. No biliary ductal dilatation. Pancreas: Generalized thickening of the pancreatic tail with prominent peripancreatic fat stranding and ill-defined fluid at the pancreatic tail, worsened, compatible with acute pancreatitis. Previously visualized generalized thickening of the pancreatic head with peripancreatic fat stranding and fluid in the pancreatic head has improved. No discrete pancreatic mass. Preserved pancreatic parenchymal enhancement. No significant pancreatic duct dilation. No measurable peripancreatic fluid  collections. Spleen: Normal size. No mass. Adrenals/Urinary Tract: Normal adrenals. Normal kidneys with no hydronephrosis and no renal mass. Normal bladder. Stomach/Bowel: Normal non-distended stomach. Normal caliber small bowel with no small bowel wall thickening. Normal appendix. Oral contrast transits to the rectum. Mild left colonic diverticulosis with no definite large bowel wall thickening. Vascular/Lymphatic: Atherosclerotic nonaneurysmal abdominal aorta. Patent portal, splenic, hepatic and renal veins. No pathologically enlarged lymph nodes in the abdomen or pelvis. Reproductive: Top-normal size prostate. Other: No pneumoperitoneum, ascites or focal fluid collection. Musculoskeletal:  No aggressive appearing focal osseous lesions. Marked degenerative disc disease at L5-S1. Chronic ballistic fragment posterior to medial right iliac bone. IMPRESSION: 1. Acute non-necrotizing pancreatitis, worsened in the pancreatic tail region and improved in the pancreatic head region since 12/30/2020 CT. No measurable peripancreatic fluid collections. 2. Small dependent bilateral pleural effusions with passive lower lobe atelectasis. 3. Mild to moderate patchy ground-glass opacities in both lungs, predominantly in the upper lobes, new from 2011 chest CT, favoring a nonspecific infectious or inflammatory etiology, with differential including viral pneumonia such as from COVID-19. 4. Mild left axillary and left internal mammary lymphadenopathy, new since 2011 chest CT, nonspecific, potentially reactive. Suggest attention on follow-up chest CT with IV contrast in 3 months. 5. Chronic ill-defined sclerosis at the first costomanubrial junctions bilaterally, similar to mildly worsened since 2011 chest CT, favoring a benign etiology. 6. Mild left colonic diverticulosis. 7. Aortic Atherosclerosis (ICD10-I70.0) and Emphysema (ICD10-J43.9). Electronically Signed   By: Ilona Sorrel M.D.   On: 01/04/2021 15:54        Scheduled  Meds: . bisacodyl  10 mg Rectal Once  . enoxaparin (LOVENOX) injection  40 mg Subcutaneous Q24H  . guaiFENesin  600 mg Oral BID  . pantoprazole (PROTONIX) IV  40 mg Intravenous Q12H  . polyethylene glycol  17 g Oral BID  . potassium chloride  40 mEq Oral Once  . senna-docusate  1 tablet Oral BID   Continuous Infusions: . sodium chloride 75 mL/hr at 01/04/21 1051  . cefTRIAXone (ROCEPHIN)  IV Stopped (01/04/21 1605)  . famotidine (PEPCID) IV 20 mg (01/05/21 0913)  . magnesium sulfate bolus IVPB    . metronidazole Stopped (01/05/21 0639)     LOS: 6 days   Time spent: 80mns, case discussed with GI Greater than 50% of this time was spent in counseling, explanation of diagnosis, planning of further management, and coordination of care.  I have personally reviewed and interpreted on  01/05/2021 daily labs,  imagings as discussed above under date review session and assessment and plans.  I reviewed all nursing notes, pharmacy notes,   vitals, pertinent old records  I have discussed plan of care as described above with RN , patient on 01/05/2021  Voice Recognition /Dragon dictation system was used to create this note, attempts have been made to correct errors. Please contact the author with questions and/or clarifications.   FFlorencia Reasons MD PhD FACP [facility]  Available via Epic secure chat 7am-7pm for nonurgent issues Please page for urgent issues To page the attending provider between 7A-7P or the covering provider during after hours 7P-7A, please log into the web site [email or web address] and access using universal [place] password for that web site. If you do not have the password, please call the hospital operator.    01/05/2021, 9:20 AM

## 2021-01-06 DIAGNOSIS — R1084 Generalized abdominal pain: Secondary | ICD-10-CM

## 2021-01-06 LAB — GLUCOSE, CAPILLARY
Glucose-Capillary: 88 mg/dL (ref 70–99)
Glucose-Capillary: 92 mg/dL (ref 70–99)
Glucose-Capillary: 94 mg/dL (ref 70–99)

## 2021-01-06 LAB — CULTURE, BLOOD (ROUTINE X 2)
Culture: NO GROWTH
Culture: NO GROWTH
Special Requests: ADEQUATE
Special Requests: ADEQUATE

## 2021-01-06 LAB — CREATININE, SERUM
Creatinine, Ser: 0.99 mg/dL (ref 0.61–1.24)
GFR, Estimated: >60 mL/min (ref >60)

## 2021-01-06 LAB — LIPASE, BLOOD: Lipase: 143 U/L — ABNORMAL HIGH (ref 11–51)

## 2021-01-06 NOTE — Progress Notes (Signed)
[facility] Progress Note  CC:  Pancreatitis  Subjective:  Doing ok.  Pain gets up to about an 8 still at times, but pain medication helps and is at about a 4 now.  Does have an appetite and is willing to try some food, however.  Passing flatus and had a loose BM today.  Objective:  Vital signs in last 24 hours: Temp:  [98.2 F (36.8 C)-98.6 F (37 C)] 98.3 F (36.8 C) (02/06 1240) Pulse Rate:  [64-79] 79 (02/06 1240) Resp:  [17-18] 18 (02/06 1240) BP: (123-141)/(72-86) 130/86 (02/06 1240) SpO2:  [90 %-99 %] 94 % (02/06 1240) Last BM Date: 01/05/21 General:  Alert, Well-developed, in NAD Heart:  Regular rate and rhythm; no murmurs Pulm:  CTAB.  No W/R/R. Abdomen:  Soft, non-distended.  BS present.  Mild epigastric TTP. Extremities:  Without edema. Neurologic:  Alert and oriented x 4;  grossly normal neurologically. Psych:  Alert and cooperative. Normal mood and affect.  Intake/Output from previous [place]: 02/05 0701 - 02/06 0700 In: -  Out: 350 [Urine:350]  Lab Results: Recent Labs    01/04/21 0413 01/05/21 0225  WBC 17.0* 13.6*  HGB 14.0 13.4  HCT 40.7 38.8*  PLT 271 280   BMET Recent Labs    01/04/21 0413 01/05/21 0225 01/06/21 0235  NA 132* 132*  --   K 3.4* 3.2*  --   CL 101 99  --   CO2 22 21*  --   GLUCOSE 99 100*  --   BUN <5* 5*  --   CREATININE 1.04 1.02 0.99  CALCIUM 8.3* 8.2*  --    LFT Recent Labs    01/05/21 0225  PROT 6.0*  ALBUMIN 2.6*  AST 15  ALT 12  ALKPHOS 53  BILITOT 1.2    CT CHEST ABDOMEN PELVIS W CONTRAST  Result Date: 01/04/2021 CLINICAL DATA:  Inpatient. Follow-up pancreatitis. Chest and abdominal pain. EXAM: CT CHEST, ABDOMEN, AND PELVIS WITH CONTRAST TECHNIQUE: Multidetector CT imaging of the chest, abdomen and pelvis was performed following the standard protocol during bolus administration of intravenous contrast. CONTRAST:  176m OMNIPAQUE IOHEXOL 300 MG/ML  SOLN COMPARISON:  01/01/2021 chest  radiograph. 12/30/2020 CT abdomen/pelvis. 07/23/2010 chest CT. FINDINGS: CT CHEST FINDINGS Cardiovascular: Normal heart size. No significant pericardial effusion/thickening. Left anterior descending coronary atherosclerosis. Great vessels are normal in course and caliber. No central pulmonary emboli. Mediastinum/Nodes: No discrete thyroid nodules. Unremarkable esophagus. No right axillary adenopathy. Mildly enlarged left axillary nodes up to 1.0 cm (series 3/image 14), new since 2011 chest CT. Asymmetrically mildly prominent 0.6 cm left internal mammary node (series 3/image 23), new. Otherwise no pathologically enlarged mediastinal nodes. No hilar adenopathy. Lungs/Pleura: No pneumothorax. Small dependent bilateral pleural effusions with passive atelectasis in the dependent lower lobes. Moderate paraseptal emphysema with mild diffuse bronchial wall thickening. Mild to moderate patchy ground-glass opacities in both lungs, predominantly in the upper lobes, new from 2011 chest CT. No lung masses or significant pulmonary nodules in the aerated portions of the lungs. Musculoskeletal: Chronic ill-defined sclerosis at the first costomanubrial junctions bilaterally, similar to mildly worsened since 2011 chest CT. Mild thoracic spondylosis. Symmetric mild bilateral gynecomastia, unchanged. CT ABDOMEN PELVIS FINDINGS Hepatobiliary: Normal liver size. Simple 1.5 cm right liver dome cyst. A few additional scattered subcentimeter hypodense liver lesions are too small to characterize and are unchanged. No new liver lesions. Cholecystectomy. No biliary ductal dilatation. Pancreas: Generalized thickening of the pancreatic tail with prominent peripancreatic fat stranding and  ill-defined fluid at the pancreatic tail, worsened, compatible with acute pancreatitis. Previously visualized generalized thickening of the pancreatic head with peripancreatic fat stranding and fluid in the pancreatic head has improved. No discrete pancreatic  mass. Preserved pancreatic parenchymal enhancement. No significant pancreatic duct dilation. No measurable peripancreatic fluid collections. Spleen: Normal size. No mass. Adrenals/Urinary Tract: Normal adrenals. Normal kidneys with no hydronephrosis and no renal mass. Normal bladder. Stomach/Bowel: Normal non-distended stomach. Normal caliber small bowel with no small bowel wall thickening. Normal appendix. Oral contrast transits to the rectum. Mild left colonic diverticulosis with no definite large bowel wall thickening. Vascular/Lymphatic: Atherosclerotic nonaneurysmal abdominal aorta. Patent portal, splenic, hepatic and renal veins. No pathologically enlarged lymph nodes in the abdomen or pelvis. Reproductive: Top-normal size prostate. Other: No pneumoperitoneum, ascites or focal fluid collection. Musculoskeletal: No aggressive appearing focal osseous lesions. Marked degenerative disc disease at L5-S1. Chronic ballistic fragment posterior to medial right iliac bone. IMPRESSION: 1. Acute non-necrotizing pancreatitis, worsened in the pancreatic tail region and improved in the pancreatic head region since 12/30/2020 CT. No measurable peripancreatic fluid collections. 2. Small dependent bilateral pleural effusions with passive lower lobe atelectasis. 3. Mild to moderate patchy ground-glass opacities in both lungs, predominantly in the upper lobes, new from 2011 chest CT, favoring a nonspecific infectious or inflammatory etiology, with differential including viral pneumonia such as from COVID-19. 4. Mild left axillary and left internal mammary lymphadenopathy, new since 2011 chest CT, nonspecific, potentially reactive. Suggest attention on follow-up chest CT with IV contrast in 3 months. 5. Chronic ill-defined sclerosis at the first costomanubrial junctions bilaterally, similar to mildly worsened since 2011 chest CT, favoring a benign etiology. 6. Mild left colonic diverticulosis. 7. Aortic Atherosclerosis  (ICD10-I70.0) and Emphysema (ICD10-J43.9). Electronically Signed   By: Ilona Sorrel M.D.   On: 01/04/2021 15:54   Assessment / Plan: *Pancreatitis:  Triglycerides normal and IgG4 normal.  S/p cholecystectomy with no biliary dilation on imaging and LFTs normal.  No ETOH in 2 years. Clinically he looks good.  Leukocytosis is improving.  Lipase up slightly today from 117 to 143 but overall stable. *Mild hyponatremia and hypokalemia:  Replacement per primary service.  -Will advance diet to soft diet.  If he does not tolerate that then may need to consider [place]. -Continue supportive care. -MRCP/MRI abdomen as outpatient once acute issues resolved to rule out pancreatic divisum and if that is unrevealing then may possibly need EUS.   LOS: 7 days   Laban Emperor. Duanne Duchesne  01/06/2021, 1:23 PM
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
## 2021-01-07 ENCOUNTER — Ambulatory Visit: Payer: Medicaid Other | Admitting: Physical Therapy

## 2021-01-07 DIAGNOSIS — K8500 Idiopathic acute pancreatitis without necrosis or infection: Secondary | ICD-10-CM

## 2021-01-07 LAB — CBC
HCT: 42.3 % (ref 39.0–52.0)
Hemoglobin: 14.4 g/dL (ref 13.0–17.0)
MCH: 31.4 pg (ref 26.0–34.0)
MCHC: 34 g/dL (ref 30.0–36.0)
MCV: 92.4 fL (ref 80.0–100.0)
Platelets: 365 10*3/uL (ref 150–400)
RBC: 4.58 MIL/uL (ref 4.22–5.81)
RDW: 12.6 % (ref 11.5–15.5)
WBC: 13 10*3/uL — ABNORMAL HIGH (ref 4.0–10.5)
nRBC: 0 % (ref 0.0–0.2)

## 2021-01-07 LAB — BASIC METABOLIC PANEL
Anion gap: 13 (ref 5–15)
BUN: 7 mg/dL (ref 6–20)
CO2: 20 mmol/L — ABNORMAL LOW (ref 22–32)
Calcium: 8.8 mg/dL — ABNORMAL LOW (ref 8.9–10.3)
Chloride: 100 mmol/L (ref 98–111)
Creatinine, Ser: 1.05 mg/dL (ref 0.61–1.24)
GFR, Estimated: >60 mL/min (ref >60)
Glucose, Bld: 102 mg/dL — ABNORMAL HIGH (ref 70–99)
Potassium: 3.7 mmol/L (ref 3.5–5.1)
Sodium: 133 mmol/L — ABNORMAL LOW (ref 135–145)

## 2021-01-07 LAB — GLUCOSE, CAPILLARY
Glucose-Capillary: 102 mg/dL — ABNORMAL HIGH (ref 70–99)
Glucose-Capillary: 137 mg/dL — ABNORMAL HIGH (ref 70–99)
Glucose-Capillary: 110 mg/dL — ABNORMAL HIGH (ref 70–99)

## 2021-01-07 LAB — MAGNESIUM: Magnesium: 1.7 mg/dL (ref 1.7–2.4)

## 2021-01-07 LAB — LIPASE, BLOOD: Lipase: 182 U/L — ABNORMAL HIGH (ref 11–51)

## 2021-01-07 MED ORDER — SODIUM CHLORIDE 0.9 % IV SOLN
2.0000 g | INTRAVENOUS | Status: AC
  Administered 2021-01-07 – 2021-01-08 (×2): 2 g via INTRAVENOUS
  Filled 2021-01-07 (×2): qty 20

## 2021-01-07 NOTE — Progress Notes (Signed)
Ok to stop ceftriaxone/flagyl at 7d per Dr. Tawanna Solo.  Onnie Boer, PharmD, BCIDP, AAHIVP, CPP Infectious Disease Pharmacist 01/07/2021 10:54 AM

## 2021-01-07 NOTE — Progress Notes (Signed)
Physical Therapy Treatment Patient Details Name: Wesley [name] MRN: [id number] DOB: 18-Mar-1966 Today's Date: 01/07/2021    History of Present Illness 55 year old with past medical history significant for COPD, chronic low back pain with sciatica, history of alcohol abuse with a strict abstinence for 2 years now who presents to the emergency department complaining of severe upper abdominal pain and low back pain. Pt with acute pancreatitis. Of note: pt just had MRI yesterday for worsening LBP, showed moderate spinal stenosis L5 worsened from MRI 2020.    PT Comments    Patient progressing well towards PT goals. Continues to report pain in left abdomen and low back which is mostly managed with pain medication. Tolerated gait training today without use of DME needing Min guard assist for safety. Pt with stiff like gait pattern with decreased step lengths bilaterally reaching for items in hallway at times for support. 2/4 DOE with Sp02 92% after activity. Encouraged getting up in room frequently and changing positions to help alleviate back pain. Will plan for higher level balance challenges next session as tolerated.   Follow Up Recommendations  Outpatient PT (for Low back pain)     Equipment Recommendations  None recommended by PT    Recommendations for Other Services       Precautions / Restrictions Precautions Precautions: Back Precaution Booklet Issued: No Precaution Comments: for comfort Restrictions Weight Bearing Restrictions: No    Mobility  Bed Mobility Overal bed mobility: Needs Assistance Bed Mobility: Rolling;Sidelying to Sit;Sit to Supine Rolling: Modified independent (Device/Increase time) Sidelying to sit: Modified independent (Device/Increase time)   Sit to supine: Modified independent (Device/Increase time)   General bed mobility comments: No assist needed. Use of rail.  Transfers Overall transfer level: Needs assistance Equipment used: None Transfers: Sit  to/from Stand Sit to Stand: Supervision         General transfer comment: Supervision for safety. Stood from [employer], no difficulties  Ambulation/Gait Ambulation/Gait assistance: Counsellor (Feet): 200 Feet Assistive device: None Gait Pattern/deviations: Step-through pattern;Decreased step length - right;Decreased step length - left;Decreased stride length;Trunk flexed Gait velocity: decreased   General Gait Details: Slow, stiff-like gait with somewhat of a flexed trunk; at times reaching for something to support self but no overt LOB. 2/4 DOE. Sp02 92% on RA.   Stairs             Wheelchair Mobility    Modified Rankin (Stroke Patients Only)       Balance Overall balance assessment: Mild deficits observed, not formally tested                                          Cognition Arousal/Alertness: Awake/alert Behavior During Therapy: WFL for tasks assessed/performed Overall Cognitive Status: Within Functional Limits for tasks assessed Area of Impairment: Memory                     Memory: Decreased short-term memory         General Comments: appears WFL for basic mobility tasks      Exercises      General Comments        Pertinent Vitals/Pain Pain Assessment: Faces Faces Pain Scale: Hurts little more Pain Location: Lft side of abdomen and back Pain Descriptors / Indicators: Aching;Sore Pain Intervention(s): Monitored during session;Repositioned    Home Living  Prior Function            PT Goals (current goals can now be found in the care plan section) Acute Rehab PT Goals Patient Stated Goal: To decrease pain. Progress towards PT goals: Progressing toward goals    Frequency    Min 3X/week      PT Plan Current plan remains appropriate    Co-evaluation              AM-PAC PT "6 Clicks" Mobility   Outcome Measure  Help needed turning from your back to your  side while in a flat bed without using bedrails?: None Help needed moving from lying on your back to sitting on the side of a flat bed without using bedrails?: None Help needed moving to and from a bed to a chair (including a wheelchair)?: A Little Help needed standing up from a chair using your arms (e.g., wheelchair or bedside chair)?: A Little Help needed to walk in hospital room?: A Little Help needed climbing 3-5 steps with a railing? : A Little 6 Click Score: 20    End of Session   Activity Tolerance: Patient tolerated treatment well Patient left: in bed;with call bell/phone within reach Nurse Communication: Mobility status PT Visit Diagnosis: Unsteadiness on feet (R26.81);Pain;Other abnormalities of gait and mobility (R26.89);Difficulty in walking, not elsewhere classified (R26.2) Pain - Right/Left: Left Pain - part of body:  (abdomen, back)     Time: 3154-0086 PT Time Calculation (min) (ACUTE ONLY): 17 min  Charges:  $Gait Training: 8-22 mins                     Marisa Severin, PT, DPT Acute Rehabilitation Services Pager [phone] Office [place] 01/07/2021, 3:01 PM

## 2021-01-07 NOTE — Progress Notes (Signed)
PROGRESS NOTE    Wesley [name]  OZH:[id number] DOB: March 20, 1966 DOA: 12/30/2020 PCP: [name], MD   Chief Complain: Abdominal pain, back pain  Brief Narrative: Patient is a 55 year old male with history of COPD, chronic back pain with sciatica, history of alcohol abuse but with abstinence of 2 years now who presented to the emergency department complains of severe upper abdominal pain, low back pain.  On presentation he was found to have elevated lipase, CT abdomen/pelvis showed peripancreatic fatty stranding without focal fluid collection or necrosis.  He was admitted for the management of acute pancreatitis.  Hospital course remarkable for poor oral intake, fever.  GI following,now signed off.  Plan is to discharge him home 1 to 2 days if he continues to have good oral intake.  Assessment & Plan:   Principal Problem:   Acute pancreatitis Active Problems:   COPD with chronic bronchitis (HCC)   Alcohol dependence in remission ([place])   Chronic midline low back pain with bilateral sciatica   Acute pancreatitis: Presented with abdominal pain.  CT finding as above.  Previous history of chronic alcohol abuse now sober for last 2 years.  Triglycerides not elevated.  Follow-up CT imaging showed acute nonnecrotizing pancreatitis, worsening the pancreatic tail region.  GI consulted and following.  Hospital course remarkable for poor oral intake.his oral intake has improved the last 24 hours.  GI also considering MRCP and endoscopic ultrasound as an outpatient. We recommend low-fat diet.  Suspected pneumonia: He also developed fever during this hospitalization.  Chest x-ray showed bilateral lower lobe infiltrate.  He was also having cough, sore throat.  Covid testing negative.  Started on ceftriaxone and Flagyl for possible aspiration pneumonia.  Currently respiratory status stable, saturating fine on room air.  Will discontinue antibiotics soon. Currently afebrile, no leukocytosis.  Blood  cultures have not shown any growth. He has mild leukocytosis, will continue to monitor.  Hypokalemia/hypomagnesemia: Will be monitored and supplemented as necessary  Ileus/constipation: Improved  COPD: Currently stable.  Not wheezing  Low back pain: MRI done here showed progressive degenerative changes.  Follows with Dr. [name] as an outpatient.  Continue supportive  care, pain management.  PT recommended outpatient PT.         DVT prophylaxis:Lovenox Code Status: Full Family Communication: None at bedside Status is: Inpatient  Remains inpatient appropriate because:Inpatient level of care appropriate due to severity of illness   Dispo: The patient is from: Home              Anticipated d/c is to: Home              Anticipated d/c date is: 1-2 days              Patient currently is not medically stable to d/c.   Difficult to place patient No      Consultants: GI  Procedures:None  Antimicrobials:  Anti-infectives (From admission, onward)   Start     Dose/Rate Route Frequency Ordered Stop   01/02/21 1400  metroNIDAZOLE (FLAGYL) IVPB 500 mg        500 mg 100 mL/hr over 60 Minutes Intravenous Every 8 hours 01/02/21 1256     01/02/21 1330  cefTRIAXone (ROCEPHIN) 1 g in sodium chloride 0.9 % 100 mL IVPB        1 g 200 mL/hr over 30 Minutes Intravenous Every 24 hours 01/02/21 1241        Subjective:  Patient seen and examined at the bedside this  morning.  He looks better today.  Still complains of some abdominal/epigastric discomfort but overall he looks better.  He is started on soft diet.  Denies any nausea or vomiting.  Objective: Vitals:   01/06/21 2331 01/07/21 0325 01/07/21 0538 01/07/21 0842  BP: (!) 121/97 113/69  116/80  Pulse: 73 (!) 57  72  Resp: _0 Temp: 98.6 F (37 C) 98.4 F (36.9 C)  98.2 F (36.8 C)  TempSrc: Oral Oral  Oral  SpO2: 94% 96%  93%  Weight:   71.6 kg   Height:        Intake/Output Summary (Last 24 hours) at 01/07/2021  1046 Last data filed at 01/07/2021 0116 Gross per 24 hour  Intake 200 ml  Output 300 ml  Net -100 ml   Filed Weights   01/04/21 0443 01/05/21 0500 01/07/21 0538  Weight: 73.1 kg 75.2 kg 71.6 kg    Examination:  General exam: Appears calm and comfortable ,Not in distress,average built HEENT:PERRL,Oral mucosa moist, Ear/Nose normal on gross exam Respiratory system: Bilateral equal air entry, normal vesicular breath sounds, no wheezes or crackles  Cardiovascular system: S1 & S2 heard, RRR. No JVD, murmurs, rubs, gallops or clicks. No pedal edema. Gastrointestinal system: Abdomen is mildly distended, soft and has some tenderness in the epigastric region. No organomegaly or masses felt. Normal bowel sounds heard. Central nervous system: Alert and oriented. No focal neurological deficits. Extremities: No edema, no clubbing ,no cyanosis Skin: No rashes, lesions or ulcers,no icterus ,no pallor   Data Reviewed: I have personally reviewed following labs and imaging studies  CBC: Recent Labs  Lab 01/02/21 0419 01/03/21 0350 01/04/21 0413 01/05/21 0225 01/07/21 0228  WBC 13.3* 15.9* 17.0* 13.6* 13.0*  NEUTROABS  --  10.7* 11.5* 8.8*  --   HGB 14.6 14.5 14.0 13.4 14.4  HCT 40.6 42.1 40.7 38.8* 42.3  MCV 90.6 91.1 91.3 90.4 92.4  PLT 240 235 271 280 381   Basic Metabolic Panel: Recent Labs  Lab 01/02/21 0419 01/03/21 0350 01/04/21 0413 01/05/21 0225 01/06/21 0235 01/07/21 0228  NA 134* 134* 132* 132*  --  133*  K 3.1* 3.4* 3.4* 3.2*  --  3.7  CL 103 101 101 99  --  100  CO2 _1 21*  --  20*  GLUCOSE 107* 101* 99 100*  --  102*  BUN <5* <5* <5* 5*  --  7  CREATININE 1.01 1.03 1.04 1.02 0.99 1.05  CALCIUM 8.1* 8.2* 8.3* 8.2*  --  8.8*  MG  --  1.6* 2.0 1.7  --  1.7   GFR: Estimated Creatinine Clearance: 76.9 mL/min (by C-G formula based on SCr of 1.05 mg/dL). Liver Function Tests: Recent Labs  Lab 01/01/21 0351 01/02/21 0419 01/03/21 0350 01/05/21 0225  AST _2 ALT _3 ALKPHOS 58 57 57 53  BILITOT 1.8* 1.5* 1.5* 1.2  PROT 6.4* 5.8* 6.0* 6.0*  ALBUMIN 3.1* 2.7* 2.6* 2.6*   Recent Labs  Lab 01/03/21 0350 01/04/21 0413 01/05/21 0225 01/06/21 0235 01/07/21 0228  LIPASE 104* 103* 117* 143* 182*   No results for input(s): AMMONIA in the last 168 hours. Coagulation Profile: No results for input(s): INR, PROTIME in the last 168 hours. Cardiac Enzymes: No results for input(s): CKTOTAL, CKMB, CKMBINDEX, TROPONINI in the last 168 hours. BNP (last 3 results) No results for input(s): PROBNP in the last 8760 hours. HbA1C: No results for input(s):  HGBA1C in the last 72 hours. CBG: Recent Labs  Lab 01/05/21 1154 01/06/21 0023 01/06/21 0636 01/06/21 1338 01/07/21 0606  GLUCAP 112* 88 92 94 102*   Lipid Profile: No results for input(s): CHOL, HDL, LDLCALC, TRIG, CHOLHDL, LDLDIRECT in the last 72 hours. Thyroid Function Tests: No results for input(s): TSH, T4TOTAL, FREET4, T3FREE, THYROIDAB in the last 72 hours. Anemia Panel: No results for input(s): VITAMINB12, FOLATE, FERRITIN, TIBC, IRON, RETICCTPCT in the last 72 hours. Sepsis Labs: Recent Labs  Lab 01/03/21 0350 01/04/21 0413 01/05/21 0225  PROCALCITON <0.10 0.14  --   LATICACIDVEN  --   --  0.8    Recent Results (from the past 240 hour(s))  SARS CORONAVIRUS 2 (TAT 6-24 HRS) Nasopharyngeal Nasopharyngeal Swab     Status: None   Collection Time: 12/30/20 10:34 PM   Specimen: Nasopharyngeal Swab  Result Value Ref Range Status   SARS Coronavirus 2 NEGATIVE NEGATIVE Final    Comment: (NOTE) SARS-CoV-2 target nucleic acids are NOT DETECTED.  The SARS-CoV-2 RNA is generally detectable in upper and lower respiratory specimens during the acute phase of infection. Negative results do not preclude SARS-CoV-2 infection, do not rule out co-infections with other pathogens, and should not be used as the sole basis for treatment or other patient management  decisions. Negative results must be combined with clinical observations, patient history, and epidemiological information. The expected result is Negative.  Fact Sheet for Patients: SugarRoll.be  Fact Sheet for Healthcare Providers: https://www.woods-mathews.com/  This test is not yet approved or cleared by the [place] FDA and  has been authorized for detection and/or diagnosis of SARS-CoV-2 by FDA under an Emergency Use Authorization (EUA). This EUA will remain  in effect (meaning this test can be used) for the duration of the COVID-19 declaration under Se ction 564(b)(1) of the Act, 21 U.S.C. section 360bbb-3(b)(1), unless the authorization is terminated or revoked sooner.  Performed at [facility], [place] [address] [place], [place] [address]   Culture, blood (routine x 2)     Status: None   Collection Time: 01/01/21  9:58 PM   Specimen: BLOOD RIGHT HAND  Result Value Ref Range Status   Specimen Description BLOOD RIGHT HAND  Final   Special Requests   Final    BOTTLES DRAWN AEROBIC ONLY Blood Culture adequate volume   Culture   Final    NO GROWTH 5 DAYS Performed at [facility], [place] [address], [place], [place] [address]    Report Status 01/06/2021 FINAL  Final  Culture, blood (routine x 2)     Status: None   Collection Time: 01/01/21  9:58 PM   Specimen: BLOOD LEFT HAND  Result Value Ref Range Status   Specimen Description BLOOD LEFT HAND  Final   Special Requests   Final    BOTTLES DRAWN AEROBIC ONLY Blood Culture adequate volume   Culture   Final    NO GROWTH 5 DAYS Performed at [facility], [place] [address] [place], [place] [address]    Report Status 01/06/2021 FINAL  Final  Urine Culture     Status: None   Collection Time: 01/02/21  7:30 AM   Specimen: Urine, Random  Result Value Ref Range Status   Specimen Description URINE, RANDOM  Final   Special Requests NONE  Final   Culture   Final    NO  GROWTH Performed at [facility], [place] [address], [place], [place] [address]  Report Status 01/03/2021 FINAL  Final  SARS Coronavirus 2 by RT PCR (hospital order, performed in [facility] hospital lab) Nasopharyngeal Nasopharyngeal Swab     Status: None   Collection Time: 01/02/21  1:13 PM   Specimen: Nasopharyngeal Swab  Result Value Ref Range Status   SARS Coronavirus 2 NEGATIVE NEGATIVE Final    Comment: (NOTE) SARS-CoV-2 target nucleic acids are NOT DETECTED.  The SARS-CoV-2 RNA is generally detectable in upper and lower respiratory specimens during the acute phase of infection. The lowest concentration of SARS-CoV-2 viral copies this assay can detect is 250 copies / mL. A negative result does not preclude SARS-CoV-2 infection and should not be used as the sole basis for treatment or other patient management decisions.  A negative result may occur with improper specimen collection / handling, submission of specimen other than nasopharyngeal swab, presence of viral mutation(s) within the areas targeted by this assay, and inadequate number of viral copies (<250 copies / mL). A negative result must be combined with clinical observations, patient history, and epidemiological information.  Fact Sheet for Patients:   StrictlyIdeas.no  Fact Sheet for Healthcare Providers: BankingDealers.co.za  This test is not yet approved or  cleared by the [place] FDA and has been authorized for detection and/or diagnosis of SARS-CoV-2 by FDA under an Emergency Use Authorization (EUA).  This EUA will remain in effect (meaning this test can be used) for the duration of the COVID-19 declaration under Section 564(b)(1) of the Act, 21 U.S.C. section 360bbb-3(b)(1), unless the authorization is terminated or revoked sooner.  Performed at [facility], [place] [address] [place], [place] [address]   Culture, blood (routine x 2)      Status: None (Preliminary result)   Collection Time: 01/04/21  5:43 PM   Specimen: BLOOD LEFT ARM  Result Value Ref Range Status   Specimen Description BLOOD LEFT ARM  Final   Special Requests   Final    BOTTLES DRAWN AEROBIC AND ANAEROBIC Blood Culture adequate volume   Culture   Final    NO GROWTH 3 DAYS Performed at [facility], [place] [address] [place], [place] [address]    Report Status PENDING  Incomplete  Culture, blood (routine x 2)     Status: None (Preliminary result)   Collection Time: 01/04/21  5:52 PM   Specimen: BLOOD RIGHT HAND  Result Value Ref Range Status   Specimen Description BLOOD RIGHT HAND  Final   Special Requests   Final    BOTTLES DRAWN AEROBIC ONLY Blood Culture results may not be optimal due to an inadequate volume of blood received in culture bottles   Culture   Final    NO GROWTH 3 DAYS Performed at [facility], [place] [address], [place], [place] [address]    Report Status PENDING  Incomplete  MRSA PCR Screening     Status: None   Collection Time: 01/04/21 10:26 PM   Specimen: Nasopharyngeal  Result Value Ref Range Status   MRSA by PCR NEGATIVE NEGATIVE Final    Comment:        The GeneXpert MRSA Assay (FDA approved for NASAL specimens only), is one component of a comprehensive MRSA colonization surveillance program. It is not intended to diagnose MRSA infection nor to guide or monitor treatment for MRSA infections. Performed at [facility], [place] [address] [place], [place] [address]          Radiology Studies: No results found.  Scheduled Meds: . bisacodyl  10 mg Rectal Once  . enoxaparin (LOVENOX) injection  40 mg Subcutaneous Q24H  . guaiFENesin  600 mg Oral BID  . pantoprazole (PROTONIX) IV  40 mg Intravenous Q12H  . polyethylene glycol  17 g Oral BID  . senna-docusate  1 tablet Oral BID   Continuous Infusions: . cefTRIAXone (ROCEPHIN)  IV 200 mL/hr at 01/07/21 0116  . famotidine (PEPCID) IV 20 mg  (01/07/21 1036)  . metronidazole 500 mg (01/07/21 0548)     LOS: 8 days    Time spent: 25 mins,More than 50% of that time was spent in counseling and/or coordination of care.      Shelly Coss, MD [facility] P2/05/2021, 10:46 AM

## 2021-01-07 NOTE — Progress Notes (Signed)
Progress Note   Subjective  Patient states his pain is about the same but his appetite is improving and he ate regular meal last night (chicken) and just ate some eggs / bacon / french toast. He does think he is improving but taking some time.    Objective   Vital signs in last 24 hours: Temp:  [98.2 F (36.8 C)-99.5 F (37.5 C)] 98.2 F (36.8 C) (02/07 0842) Pulse Rate:  [57-79] 72 (02/07 0842) Resp:  [16-18] 18 (02/07 0842) BP: (113-142)/(69-97) 116/80 (02/07 0842) SpO2:  [90 %-96 %] 93 % (02/07 0842) Weight:  [71.6 kg] 71.6 kg (02/07 0538) Last BM Date: 01/06/21 General:    AA male in NAD Heart:  Regular rate and rhythm; no murmurs Lungs: Respirations even and unlabored, lungs CTA bilaterally Abdomen:  Soft, epigastric tenderness, non distended Extremities:  Without edema. Neurologic:  Alert and oriented,  grossly normal neurologically. Psych:  Cooperative. Normal mood and affect.  Intake/Output from previous [place]: 02/06 0701 - 02/07 0700 In: 200 [IV Piggyback:200] Out: 300 [Urine:300] Intake/Output this shift: No intake/output data recorded.  Lab Results: Recent Labs    01/05/21 0225 01/07/21 0228  WBC 13.6* 13.0*  HGB 13.4 14.4  HCT 38.8* 42.3  PLT 280 365   BMET Recent Labs    01/05/21 0225 01/06/21 0235 01/07/21 0228  NA 132*  --  133*  K 3.2*  --  3.7  CL 99  --  100  CO2 21*  --  20*  GLUCOSE 100*  --  102*  BUN 5*  --  7  CREATININE 1.02 0.99 1.05  CALCIUM 8.2*  --  8.8*   LFT Recent Labs    01/05/21 0225  PROT 6.0*  ALBUMIN 2.6*  AST 15  ALT 12  ALKPHOS 53  BILITOT 1.2   PT/INR No results for input(s): LABPROT, INR in the last 72 hours.  Studies/Results: No results found.     Assessment / Plan:    55 y/o male admitted with recurrent pancreatitis. Unclear etiology, denies recent EtOH use, LFTs normal, serologic workup unremarkable. CT on 2/4 showed improved changes in the head of the pancrease but some worsening in the  tail region. His PO intake has improved in the past 24 hours, appetite improving, but still having some pain. We discussed pancreatitis, he seems to be slowly improving. Counseled him on a low fat diet and he should try to maintain that as best he can (eating bacon today). Will see how he tolerates PO today and if this continues to go well, he can be discharged in the upcoming days once his pain can be controlled with oral meds. MRCP should be done in the next month or so as outpatient if he improves, exclude divisum, and then EUS will be considered as well as outpatient. We will coordinate outpatient follow up with him after his discharge. Otherwise continue supportive care for now, suspect he may be here another [place] or 2 pending his course. We will sign off for now, please call with questions / concerns.  Rives Cellar, MD [facility]

## 2021-01-07 NOTE — Progress Notes (Signed)
Occupational Therapy Treatment Patient Details Name: Wesley [name] MRN: [id number] DOB: [birth year] Today's Date: 01/07/2021    History of present illness 55 year old with past medical history significant for COPD, chronic low back pain with sciatica, history of alcohol abuse with a strict abstinence for 2 years now who presents to the emergency department complaining of severe upper abdominal pain and low back pain. Pt with acute pancreatitis. Of note: pt just had MRI yesterday for worsening LBP, showed moderate spinal stenosis L5 worsened from MRI 2020.   OT comments  OT treatment session with focus on safety with household mobility and self-care re-education. Patient currently functioning at Mod I level grossly for self-care tasks with use of DME including RW and BSC over standard height toilet. Patient reports STM deficits at baseline with difficulty verbally recalling back precautions for pain management but patient demonstrates understanding during functional tasks remembering to keep shoulders and hips in line during bed mobility and donning footwear in figure-4 position. Household mobility with and without use of RW upward to 149f without overt LOB and close supervision A for safety. Patient reports functioning near his baseline but continues to be limited by pain in side consistent with pancreatitis and low back pain controlled with scheduled oral meds. Patient does not require continued acute occupational therapy services with OT to sign off at this time.     Follow Up Recommendations  No OT follow up    Equipment Recommendations  3 in 1 bedside commode    Recommendations for Other Services      Precautions / Restrictions Precautions Precautions: Back (For pain management) Precaution Booklet Issued: No Precaution Comments: Verbally reviewed precautions. Patient with STM deficits at baseline. Can recall 2/3 precautions with Min A. Restrictions Weight Bearing Restrictions: No        Mobility Bed Mobility Overal bed mobility: Needs Assistance Bed Mobility: Rolling;Sidelying to Sit Rolling: Modified independent (Device/Increase time) Sidelying to sit: Modified independent (Device/Increase time)       General bed mobility comments: Mod I for all parts of bed mobility after repeat reminders for technique 2/2 STM deficits at baseline. HOB flat.  Transfers Overall transfer level: Needs assistance Equipment used: Rolling walker (2 wheeled) Transfers: Sit to/from Stand Sit to Stand: Supervision         General transfer comment: Supervision A for safety with cues for hand placement.    Balance Overall balance assessment: Mild deficits observed, not formally tested                                         ADL either performed or assessed with clinical judgement   ADL Overall ADL's : Needs assistance/impaired                     Lower Body Dressing: Supervision/safety;Sit to/from stand Lower Body Dressing Details (indicate cue type and reason): Able to doff/don footwear seated EOB. Good return demo of back precautions for pain management.             Functional mobility during ADLs: Supervision/safety;Rolling walker General ADL Comments: Making great progress toward OT goals. Overall Mod I to supervision A this date.     Vision   Vision Assessment?: No apparent visual deficits   Perception     Praxis      Cognition Arousal/Alertness: Awake/alert Behavior During Therapy: WFL for tasks assessed/performed Overall Cognitive Status: Impaired/Different  from baseline Area of Impairment: Memory                     Memory: Decreased short-term memory         General Comments: Decreased ability to recall back precautions after repeat education but follows back precautions during functional tasks including LB dressing seated EOB and bed mobility.        Exercises     Shoulder Instructions       General  Comments      Pertinent Vitals/ Pain       Pain Assessment: Faces Faces Pain Scale: Hurts little more Pain Location: L side of abdomen and back Pain Descriptors / Indicators: Aching Pain Intervention(s): Limited activity within patient's tolerance;Monitored during session;Repositioned  Home Living                                          Prior Functioning/Environment              Frequency  Min 2X/week        Progress Toward Goals  OT Goals(current goals can now be found in the care plan section)  Progress towards OT goals: Progressing toward goals  Acute Rehab OT Goals Patient Stated Goal: To decrease pain. OT Goal Formulation: With patient Time For Goal Achievement: 01/16/21 Potential to Achieve Goals: Good ADL Goals Pt Will Perform Grooming: with modified independence;standing Pt Will Perform Lower Body Dressing: with modified independence;sit to/from stand;with adaptive equipment Pt Will Transfer to Toilet: with modified independence;ambulating;bedside commode Pt Will Perform Toileting - Clothing Manipulation and hygiene: with modified independence;sit to/from stand  Plan Discharge plan remains appropriate;Frequency remains appropriate    Co-evaluation                 AM-PAC OT "6 Clicks" Daily Activity     Outcome Measure   Help from another person eating meals?: None Help from another person taking care of personal grooming?: A Little Help from another person toileting, which includes using toliet, bedpan, or urinal?: A Little Help from another person bathing (including washing, rinsing, drying)?: A Little Help from another person to put on and taking off regular upper body clothing?: None Help from another person to put on and taking off regular lower body clothing?: A Little 6 Click Score: 20    End of Session Equipment Utilized During Treatment: Gait belt;Rolling walker      Activity Tolerance Patient tolerated treatment  well   Patient Left in chair;with call bell/phone within reach;with chair alarm set   Nurse Communication          Time: 4098-1191 OT Time Calculation (min): 22 min  Charges: OT General Charges $OT Visit: 1 Visit OT Treatments $Self Care/Home Management : 8-22 mins  Rhen Kawecki H. OTR/L Supplemental OT, Department of rehab services [phone]   Scherry Laverne R H. 01/07/2021, 1:04 PM

## 2021-01-08 LAB — CBC WITH DIFFERENTIAL/PLATELET
Abs Immature Granulocytes: 0.03 10*3/uL (ref 0.00–0.07)
Basophils Absolute: 0.1 10*3/uL (ref 0.0–0.1)
Basophils Relative: 1 %
Eosinophils Absolute: 0.6 10*3/uL — ABNORMAL HIGH (ref 0.0–0.5)
Eosinophils Relative: 5 %
HCT: 41.7 % (ref 39.0–52.0)
Hemoglobin: 14.2 g/dL (ref 13.0–17.0)
Immature Granulocytes: 0 %
Lymphocytes Relative: 24 %
Lymphs Abs: 2.7 10*3/uL (ref 0.7–4.0)
MCH: 31.1 pg (ref 26.0–34.0)
MCHC: 34.1 g/dL (ref 30.0–36.0)
MCV: 91.4 fL (ref 80.0–100.0)
Monocytes Absolute: 1.9 10*3/uL — ABNORMAL HIGH (ref 0.1–1.0)
Monocytes Relative: 17 %
Neutro Abs: 5.8 10*3/uL (ref 1.7–7.7)
Neutrophils Relative %: 53 %
Platelets: 422 10*3/uL — ABNORMAL HIGH (ref 150–400)
RBC: 4.56 MIL/uL (ref 4.22–5.81)
RDW: 12.5 % (ref 11.5–15.5)
WBC: 11 10*3/uL — ABNORMAL HIGH (ref 4.0–10.5)
nRBC: 0 % (ref 0.0–0.2)

## 2021-01-08 LAB — GLUCOSE, CAPILLARY
Glucose-Capillary: 103 mg/dL — ABNORMAL HIGH (ref 70–99)
Glucose-Capillary: 106 mg/dL — ABNORMAL HIGH (ref 70–99)
Glucose-Capillary: 115 mg/dL — ABNORMAL HIGH (ref 70–99)
Glucose-Capillary: 78 mg/dL (ref 70–99)

## 2021-01-08 LAB — LIPASE, BLOOD: Lipase: 261 U/L — ABNORMAL HIGH (ref 11–51)

## 2021-01-08 MED ORDER — OXYCODONE-ACETAMINOPHEN 7.5-325 MG PO TABS: 1.0000 | ORAL_TABLET | ORAL | 0 refills | Status: DC | PRN

## 2021-01-08 MED ORDER — POLYETHYLENE GLYCOL 3350 17 G PO PACK: 17.0000 g | PACK | Freq: Every day | ORAL | 0 refills | Status: DC | PRN

## 2021-01-08 MED FILL — ?POLYETHYLENE GLYCOL 3350 P: 17 | 30 days supply | Qty: 30 | Fill #0

## 2021-01-08 NOTE — Discharge Summary (Signed)
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  MG  --  1.6* 2.0 1.7  --  1.7   Liver Function Tests: Recent Labs  Lab 01/02/21 0419 01/03/21 0350 01/05/21 0225  AST _1 ALT _2 ALKPHOS 57 57 53  BILITOT 1.5* 1.5* 1.2  PROT 5.8* 6.0* 6.0*  ALBUMIN 2.7* 2.6* 2.6*   Recent Labs  Lab 01/04/21 0413 01/05/21 0225 01/06/21 0235 01/07/21 0228 01/08/21 0050  LIPASE 103* 117* 143* 182* 261*   No results for input(s): AMMONIA in the last 168 hours. CBC: Recent Labs  Lab 01/03/21 0350 01/04/21 0413 01/05/21 0225 01/07/21 0228 01/08/21 0050  WBC 15.9* 17.0* 13.6* 13.0* 11.0*  NEUTROABS 10.7* 11.5* 8.8*  --  5.8  HGB 14.5 14.0 13.4  14.4 14.2  HCT 42.1 40.7 38.8* 42.3 41.7  MCV 91.1 91.3 90.4 92.4 91.4  PLT 235 271 280 365 422*   Cardiac Enzymes: No results for input(s): CKTOTAL, CKMB, CKMBINDEX, TROPONINI in the last 168 hours. BNP: Invalid input(s): POCBNP CBG: Recent Labs  Lab 01/07/21 0606 01/07/21 1211  01/07/21 1822 01/08/21 0000 01/08/21 0611  GLUCAP 102* 137* 110* 115* 103*   D-Dimer No results for input(s): DDIMER in the last 72 hours. Hgb A1c No results for input(s): HGBA1C in the last 72 hours. Lipid Profile No results for input(s): CHOL, HDL, LDLCALC, TRIG, CHOLHDL, LDLDIRECT in the last 72 hours. Thyroid function studies No results for input(s): TSH, T4TOTAL, T3FREE, THYROIDAB in the last 72 hours.  Invalid input(s): FREET3 Anemia work up No results for input(s): VITAMINB12, FOLATE, FERRITIN, TIBC, IRON, RETICCTPCT in the last 72 hours. Urinalysis    Component Value Date/Time   COLORURINE YELLOW 12/30/2020 2102   APPEARANCEUR CLEAR 12/30/2020 2102   LABSPEC 1.021 12/30/2020 2102   PHURINE 5.0 12/30/2020 2102   GLUCOSEU NEGATIVE 12/30/2020 2102   HGBUR NEGATIVE 12/30/2020 2102   BILIRUBINUR NEGATIVE 12/30/2020 2102   BILIRUBINUR negative 05/23/2019 1456   KETONESUR NEGATIVE 12/30/2020 2102   PROTEINUR NEGATIVE 12/30/2020 2102   UROBILINOGEN 0.2 05/23/2019 1456   NITRITE NEGATIVE 12/30/2020 2102   LEUKOCYTESUR NEGATIVE 12/30/2020 2102   Sepsis Labs Invalid input(s): PROCALCITONIN,  WBC,  LACTICIDVEN Microbiology Recent Results (from the past 240 hour(s))  SARS CORONAVIRUS 2 (TAT 6-24 HRS) Nasopharyngeal Nasopharyngeal Swab     Status: None   Collection Time: 12/30/20 10:34 PM   Specimen: Nasopharyngeal Swab  Result Value Ref Range Status   SARS Coronavirus 2 NEGATIVE NEGATIVE Final    Comment: (NOTE) SARS-CoV-2 target nucleic acids are NOT DETECTED.  The SARS-CoV-2 RNA is generally detectable in upper and lower respiratory specimens during the acute phase of infection.  Negative results do not preclude SARS-CoV-2 infection, do not rule out co-infections with other pathogens, and should not be used as the sole basis for treatment or other patient management decisions. Negative results must be combined with clinical observations, patient history, and epidemiological information. The expected result is Negative.  Fact Sheet for Patients: SugarRoll.be  Fact Sheet for Healthcare Providers: https://www.woods-mathews.com/  This test is not yet approved or cleared by the [place] FDA and  has been authorized for detection and/or diagnosis of SARS-CoV-2 by FDA under an Emergency Use Authorization (EUA). This EUA will remain  in effect (meaning this test can be used) for the duration of the COVID-19 declaration under Se ction 564(b)(1) of the Act, 21 U.S.C. section 360bbb-3(b)(1), unless the authorization is terminated or revoked sooner.  Performed at [facility], [place] [address], [place], [place] [address]   Culture, blood (routine x 2)     Status: None   Collection Time: 01/01/21  9:58 PM   Specimen: BLOOD RIGHT HAND  Result Value Ref Range Status   Specimen Description BLOOD RIGHT HAND  Final   Special Requests   Final    BOTTLES DRAWN AEROBIC ONLY Blood Culture adequate volume   Culture   Final    NO GROWTH 5 DAYS Performed at [facility], [place] [address] [place], [place] [address]    Report Status 01/06/2021 FINAL  Final  Culture, blood (routine x 2)     Status: None   Collection Time: 01/01/21  9:58 PM   Specimen: BLOOD LEFT HAND  Result Value Ref Range Status   Specimen Description BLOOD LEFT HAND  Final   Special Requests   Final    BOTTLES DRAWN AEROBIC ONLY Blood Culture adequate volume   Culture   Final    NO GROWTH 5 DAYS Performed at [facility], [place] [address], [place], [place] [address]  Report Status 01/06/2021 FINAL  Final  Urine Culture     Status: None    Collection Time: 01/02/21  7:30 AM   Specimen: Urine, Random  Result Value Ref Range Status   Specimen Description URINE, RANDOM  Final   Special Requests NONE  Final   Culture   Final    NO GROWTH Performed at [facility], [address] [place], [place] [address]    Report Status 01/03/2021 FINAL  Final  SARS Coronavirus 2 by RT PCR (hospital order, performed in [facility] hospital lab) Nasopharyngeal Nasopharyngeal Swab     Status: None   Collection Time: 01/02/21  1:13 PM   Specimen: Nasopharyngeal Swab  Result Value Ref Range Status   SARS Coronavirus 2 NEGATIVE NEGATIVE Final    Comment: (NOTE) SARS-CoV-2 target nucleic acids are NOT DETECTED.  The SARS-CoV-2 RNA is generally detectable in upper and lower respiratory specimens during the acute phase of infection. The lowest concentration of SARS-CoV-2 viral copies this assay can detect is 250 copies / mL. A negative result does not preclude SARS-CoV-2 infection and should not be used as the sole basis for treatment or other patient management decisions.  A negative result may occur with improper specimen collection / handling, submission of specimen other than nasopharyngeal swab, presence of viral mutation(s) within the areas targeted by this assay, and inadequate number of viral copies (<250 copies / mL). A negative result must be combined with clinical observations, patient history, and epidemiological information.  Fact Sheet for Patients:   StrictlyIdeas.no  Fact Sheet for Healthcare Providers: BankingDealers.co.za  This test is not yet approved or  cleared by the [place] FDA and has been authorized for detection and/or diagnosis of SARS-CoV-2 by FDA under an Emergency Use Authorization (EUA).  This EUA will remain in effect (meaning this test can be used) for the duration of the COVID-19 declaration under Section 564(b)(1) of the Act, 21 U.S.C. section  360bbb-3(b)(1), unless the authorization is terminated or revoked sooner.  Performed at [facility], [place] [address] [place], [place] [address]   Culture, blood (routine x 2)     Status: None (Preliminary result)   Collection Time: 01/04/21  5:43 PM   Specimen: BLOOD LEFT ARM  Result Value Ref Range Status   Specimen Description BLOOD LEFT ARM  Final   Special Requests   Final    BOTTLES DRAWN AEROBIC AND ANAEROBIC Blood Culture adequate volume   Culture   Final    NO GROWTH 4 DAYS Performed at [facility], [place] [address], [place], [place] [address]    Report Status PENDING  Incomplete  Culture, blood (routine x 2)     Status: None (Preliminary result)   Collection Time: 01/04/21  5:52 PM   Specimen: BLOOD RIGHT HAND  Result Value Ref Range Status   Specimen Description BLOOD RIGHT HAND  Final   Special Requests   Final    BOTTLES DRAWN AEROBIC ONLY Blood Culture results may not be optimal due to an inadequate volume of blood received in culture bottles   Culture   Final    NO GROWTH 4 DAYS Performed at [facility], [place] [address], [place], [place] [address]    Report Status PENDING  Incomplete  MRSA PCR Screening     Status: None   Collection Time: 01/04/21 10:26 PM   Specimen: Nasopharyngeal  Result Value Ref Range Status   MRSA by PCR NEGATIVE NEGATIVE  Final    Comment:        The GeneXpert MRSA Assay (FDA approved for NASAL specimens only), is one component of a comprehensive MRSA colonization surveillance program. It is not intended to diagnose MRSA infection nor to guide or monitor treatment for MRSA infections. Performed at [facility], [place] [address] [place], [place] [address]     Please note: You were cared for by a hospitalist during your hospital stay. Once you are discharged, your primary care physician will handle any further medical issues. Please note that NO REFILLS for any discharge medications will be authorized once you  are discharged, as it is imperative that you return to your primary care physician (or establish a relationship with a primary care physician if you do not have one) for your post hospital discharge needs so that they can reassess your need for medications and monitor your lab values.    Time coordinating discharge: 40 minutes  SIGNED:   Shelly Coss, MD  [facility] 01/08/2021, 10:37 AM Pager [phone]  If 7PM-7AM, please contact night-coverage [email or web address] Password [email or web address]

## 2021-01-08 NOTE — Progress Notes (Signed)
Physical Therapy Treatment Patient Details Name: Wesley [name] MRN: [id number] DOB: 03/04/1966 Today's Date: 01/08/2021    History of Present Illness Pt 55 year old with past medical history significant for COPD, chronic low back pain with sciatica, history of alcohol abuse with a strict abstinence for 2 years now who presents to the emergency department complaining of severe upper abdominal pain and low back pain. Pt with acute pancreatitis. Of note: pt just had MRI yesterday for worsening LBP, showed moderate spinal stenosis L5 worsened from MRI 2020.    PT Comments    Pt making good progress.  He demonstrates safe transfers and gait without AD.  Does demonstrate mild balance deficits in higher level balance task - scored a 19/24 on the DGI which is the cut-off for non-fall risk.  Pt does demonstrate stiff gait pattern that he reports is due to chronic back pain.  Pt has back precaution handout and is utilizing precautions for comfort.  Continue to recommend outpt PT if pt desires to address low back pain.       Follow Up Recommendations  Outpatient PT (for low back pain)     Equipment Recommendations  None recommended by PT    Recommendations for Other Services       Precautions / Restrictions Precautions Precautions: Back Precaution Booklet Issued: Yes (comment) Precaution Comments: for comfort Restrictions Weight Bearing Restrictions: No    Mobility  Bed Mobility Overal bed mobility: Modified Independent Bed Mobility: Rolling;Sidelying to Sit;Sit to Supine Rolling: Modified independent (Device/Increase time) Sidelying to sit: Modified independent (Device/Increase time);HOB elevated   Sit to supine: Modified independent (Device/Increase time);HOB elevated   General bed mobility comments: No assist needed. Utilized back precautions  Transfers Overall transfer level: Needs assistance Equipment used: None Transfers: Sit to/from Stand Sit to Stand: Supervision          General transfer comment: Supervision for safety. Stood from [employer], no difficulties  Ambulation/Gait Ambulation/Gait assistance: Scientist, forensic (Feet): 300 Feet Assistive device: None Gait Pattern/deviations: Step-through pattern;Wide base of support     General Gait Details: Slow, stiff-like gait that pt reports is from chronic back pain; No AD used and did not reach out for support today.  On RA with O2 sats 94% and DOE of 1/4 (hx COPD_   Stairs Stairs: Yes Stairs assistance: Min guard Stair Management: Step to pattern;One rail Right Number of Stairs: 5 General stair comments: performed safely   Wheelchair Mobility    Modified Rankin (Stroke Patients Only)       Balance Overall balance assessment: Modified Independent   Sitting balance-Leahy Scale: Normal     Standing balance support: No upper extremity supported Standing balance-Leahy Scale: Good                   Standardized Balance Assessment Standardized Balance Assessment : Dynamic Gait Index   Dynamic Gait Index Level Surface: Mild Impairment Change in Gait Speed: Normal Gait with Horizontal Head Turns: Mild Impairment Gait with Vertical Head Turns: Mild Impairment Gait and Pivot Turn: Normal Step Over Obstacle: Normal Step Around Obstacles: Normal Steps: Moderate Impairment Total Score: 19      Cognition Arousal/Alertness: Awake/alert Behavior During Therapy: WFL for tasks assessed/performed Overall Cognitive Status: Within Functional Limits for tasks assessed  Exercises      General Comments        Pertinent Vitals/Pain Pain Assessment: 0-10 Pain Score: 4  Pain Location: Lft side of abdomen and back Pain Descriptors / Indicators: Aching;Sore Pain Intervention(s): Monitored during session;Repositioned;Other (comment) (utilized back precautions)    Home Living                      Prior Function             PT Goals (current goals can now be found in the care plan section) Acute Rehab PT Goals Patient Stated Goal: return home PT Goal Formulation: With patient Time For Goal Achievement: 01/15/21 Potential to Achieve Goals: Good Progress towards PT goals: Progressing toward goals    Frequency    Min 3X/week      PT Plan Current plan remains appropriate    Co-evaluation              AM-PAC PT "6 Clicks" Mobility   Outcome Measure  Help needed turning from your back to your side while in a flat bed without using bedrails?: None Help needed moving from lying on your back to sitting on the side of a flat bed without using bedrails?: None Help needed moving to and from a bed to a chair (including a wheelchair)?: None Help needed standing up from a chair using your arms (e.g., wheelchair or bedside chair)?: None Help needed to walk in hospital room?: None Help needed climbing 3-5 steps with a railing? : A Little 6 Click Score: 23    End of Session Equipment Utilized During Treatment: Gait belt Activity Tolerance: Patient tolerated treatment well Patient left: in bed;with call bell/phone within reach (pt has been ambulating independently in room) Nurse Communication: Mobility status PT Visit Diagnosis: Unsteadiness on feet (R26.81);Pain;Other abnormalities of gait and mobility (R26.89);Difficulty in walking, not elsewhere classified (R26.2) Pain - Right/Left: Left     Time: 1055-1110 PT Time Calculation (min) (ACUTE ONLY): 15 min  Charges:  $Gait Training: 8-22 mins                     Abran Richard, PT Acute Rehab Services Pager [phone] [facility] [place] 01/08/2021, 11:55 AM

## 2021-01-08 NOTE — TOC Transition Note (Signed)
Transition of Care [facility]) - CM/SW Discharge Note   Patient Details  Name: Wesley [name] MRN: [id number] Date of Birth: 1966-09-30  [facility] CM/SW Contact:  Pollie Friar, RN Phone Number: 01/08/2021, 11:53 AM   Clinical Narrative:    Pt is discharging home with outpatient therapy through [facility]. Orders in Epic and information on the AVS. 3 in 1 for home is at the bedside.  Meds except narcotics sent to [facility] pharmacy. Pt provided prescription for narcotic and Good Rx card to assist with the cost.  Pt is active with [facility] and will have f/u through the clinic. Pt has transportation home.    Final next level of care: OP Rehab Barriers to Discharge: Inadequate or no insurance,Barriers Unresolved (comment)   Patient Goals and CMS Choice     Choice offered to / list presented to : Patient  Discharge Placement                       Discharge Plan and Services   Discharge Planning Services: CM Consult Post Acute Care Choice: Durable Medical Equipment          DME Arranged: 3-N-1 DME Agency: [employer]     Representative spoke with at DME Agency: [facility] services            Social Determinants of Health ([place]) Interventions     Readmission Risk Interventions No flowsheet data found.

## 2021-01-08 NOTE — Progress Notes (Signed)
Pt verbalizes understanding of all discharge instructions, follow up appointments, where to pick up and fill prescriptions.  He has all belongings with him including his wallet, phone and charger.  Pt mother to transport home.

## 2021-01-09 ENCOUNTER — Telehealth: Payer: Self-pay | Admitting: *Deleted

## 2021-01-09 LAB — CULTURE, BLOOD (ROUTINE X 2)
Culture: NO GROWTH
Culture: NO GROWTH
Special Requests: ADEQUATE

## 2021-01-09 NOTE — Telephone Encounter (Signed)
Transition Care Management Follow-up Telephone Call  Date of discharge and from where: 01/08/2021 - [facility]  How have you been since you were released from the hospital? "Doing a little better"  Any questions or concerns? No  Items Reviewed:  Did the pt receive and understand the discharge instructions provided? Yes   Medications obtained and verified? Yes   Other? N/A  Any new allergies since your discharge? No   Dietary orders reviewed? Yes  Do you have support at home? Yes   Home Care and Equipment/Supplies: Were home health services ordered? not applicable If so, what is the name of the agency? N/A  Has the agency set up a time to come to the patient's home? not applicable Were any new equipment or medical supplies ordered?  No What is the name of the medical supply agency? N/A Were you able to get the supplies/equipment? not applicable Do you have any questions related to the use of the equipment or supplies? No  Functional Questionnaire: (I = Independent and D = Dependent) ADLs: I  Bathing/Dressing- I  Meal Prep- I  Eating- I  Maintaining continence- I  Transferring/Ambulation- I  Managing Meds- I  Follow up appointments reviewed:   PCP Hospital f/u appt confirmed? Yes  Scheduled to see Dr. Joya Gaskins on 02/14/2021 @ 0930.  [facility] f/u appt confirmed? Yes  Scheduled to see GI on 02/05/2021 @ 1040.  Are transportation arrangements needed? No   If their condition worsens, is the pt aware to call PCP or go to the Emergency Dept.? Yes  Was the patient provided with contact information for the PCP's office or ED? Yes  Was to pt encouraged to call back with questions or concerns? Yes

## 2021-01-10 ENCOUNTER — Telehealth: Payer: Self-pay

## 2021-01-10 NOTE — Telephone Encounter (Signed)
-----  Message from Yetta Flock, MD sent at 01/07/2021 11:00 AM EST ----- Claiborne Billings can you help coordinate outpatient follow up with Dr. Loletha Grayer in the next month or so, or APP, after his discharge for pancreatitis. He may be here another few days. Thanks

## 2021-01-10 NOTE — Telephone Encounter (Signed)
Spoke to patient to inform him that he is scheduled for a follow up appointment with Dr [name] on 02/05/21 at 10:40 am. Patient knows to go to the [facility] office. All questions answered.

## 2021-01-16 ENCOUNTER — Ambulatory Visit: Payer: Medicaid Other | Admitting: Orthopaedic Surgery

## 2021-01-29 ENCOUNTER — Encounter: Payer: Self-pay | Admitting: Orthopaedic Surgery

## 2021-01-29 ENCOUNTER — Ambulatory Visit (INDEPENDENT_AMBULATORY_CARE_PROVIDER_SITE_OTHER): Payer: Medicaid Other | Admitting: Orthopaedic Surgery

## 2021-01-29 DIAGNOSIS — M5136 Other intervertebral disc degeneration, lumbar region: Secondary | ICD-10-CM
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
## 2021-02-05 ENCOUNTER — Encounter: Payer: Self-pay | Admitting: Gastroenterology

## 2021-02-05 ENCOUNTER — Other Ambulatory Visit: Payer: Self-pay

## 2021-02-05 ENCOUNTER — Other Ambulatory Visit (INDEPENDENT_AMBULATORY_CARE_PROVIDER_SITE_OTHER): Payer: Self-pay

## 2021-02-05 ENCOUNTER — Ambulatory Visit (INDEPENDENT_AMBULATORY_CARE_PROVIDER_SITE_OTHER): Payer: No Typology Code available for payment source | Admitting: Gastroenterology

## 2021-02-05 VITALS — BP 112/74 | HR 94 | Ht 68.0 in | Wt 158.1 lb

## 2021-02-05 DIAGNOSIS — R1013 Epigastric pain: Secondary | ICD-10-CM

## 2021-02-05 DIAGNOSIS — IMO0002 Reserved for concepts with insufficient information to code with codable children: Secondary | ICD-10-CM

## 2021-02-05 DIAGNOSIS — F17200 Nicotine dependence, unspecified, uncomplicated: Secondary | ICD-10-CM

## 2021-02-05 DIAGNOSIS — R6881 Early satiety: Secondary | ICD-10-CM

## 2021-02-05 DIAGNOSIS — R110 Nausea: Secondary | ICD-10-CM

## 2021-02-05 DIAGNOSIS — K859 Acute pancreatitis without necrosis or infection, unspecified: Secondary | ICD-10-CM

## 2021-02-05 LAB — COMPREHENSIVE METABOLIC PANEL
ALT: 10 U/L (ref 0–53)
AST: 15 U/L (ref 0–37)
Albumin: 4.1 g/dL (ref 3.5–5.2)
Alkaline Phosphatase: 90 U/L (ref 39–117)
BUN: 7 mg/dL (ref 6–23)
CO2: 28 mEq/L (ref 19–32)
Calcium: 9.9 mg/dL (ref 8.4–10.5)
Chloride: 98 mEq/L (ref 96–112)
Creatinine, Ser: 0.96 mg/dL (ref 0.40–1.50)
GFR: 88.7 mL/min (ref >60.00)
Glucose, Bld: 92 mg/dL (ref 70–99)
Potassium: 4.6 mEq/L (ref 3.5–5.1)
Sodium: 135 mEq/L (ref 135–145)
Total Bilirubin: 1 mg/dL (ref 0.2–1.2)
Total Protein: 7.9 g/dL (ref 6.0–8.3)

## 2021-02-05 LAB — CBC
HCT: 49.2 % (ref 39.0–52.0)
Hemoglobin: 16.7 g/dL (ref 13.0–17.0)
MCHC: 33.9 g/dL (ref 30.0–36.0)
MCV: 92.7 fl (ref 78.0–100.0)
Platelets: 312 10*3/uL (ref 150.0–400.0)
RBC: 5.31 Mil/uL (ref 4.22–5.81)
RDW: 13.9 % (ref 11.5–15.5)
WBC: 9.4 10*3/uL (ref 4.0–10.5)

## 2021-02-05 LAB — LIPASE: Lipase: 44 U/L (ref 11.0–59.0)

## 2021-02-05 NOTE — Progress Notes (Signed)
P  Chief Complaint:    Hospital follow-up  GI History: 55 year old male with a history of COPD, chronic low back pain with sciatica, history of alcohol abuse (abstinent since 01/2018), history of acute recurrent pancreatitis, HCV s/p tx w/ SVR, hepatic steatosis, hx of ccy 12/2018.   Endoscopic History: -EGD (05/2019, Dr. [name]): Mild gastritis, otherwise normal -Colonoscopy (05/2019, Dr. [name]): 7 polyps (TA x6), sigmoid diverticulosis, small internal hemorrhoids.  Normal TI.  Repeat in 3 years  HPI:     Patient is a 55 y.o. male presenting [facility] for hospital follow-up.  Admitted 1/30-2/8, 2022 with acute recurrent pancreatitis.  Has not had any alcohol in 2+ years.  Previous to this admission, last admission for pancreatitis was 04/2019. -Lipase peaked at 261.  Normal liver enzymes. -Elevated ESR/CRP -CT A/P: Peripancreatic fatty stranding without pseudocyst or necrosis -TG normal.  HIV negative, IgG4 negative -Repeat CT A/P: Acute, nonnecrotizing pancreatitis, worsening in the pancreatic tail but improved in HOP region -Also treated for possible aspiration pneumonia treated with Abx -Recommended outpatient MRI/MRCP to rule out pancreatic divisum and if unrevealing, outpatient EUS  Today, he states he still has intermittent epigastric pain and p.o. intake not back to baseline. Early satiety and is afraid to eat if he has to go anywhere due to post prandial stools (which can range formed to loose). No steatorrhea or hematochezia or melena. Was eating fine before most recent hospitalization. Post prandial nausea without emesis.   Not exercising. Still smoking 1 PPD.   No new imaging or labs since hospital discharge last month.  Review of systems:     No chest pain, no SOB, no fevers, no urinary sx   Past Medical History:  Diagnosis Date  . Acute pancreatitis   . Alcohol abuse   . Cholecystitis 01/2018  . Chronic hepatitis C ([facility])   .  EMPHYSEMATOUS BLEB 08/07/2010   Qualifier: Diagnosis of  By: [name] MD, [name]   . History of blood transfusion   . Neuropathy   . Radial nerve compression    right  . Spinal stenosis, lumbar   . SPONTANEOUS PNEUMOTHORAX 08/07/2010   Qualifier: History of  By: Tilden Dome      Patient's surgical history, family medical history, social history, medications and allergies were all reviewed in Epic    Current Outpatient Medications  Medication Sig Dispense Refill  . DULoxetine (CYMBALTA) 60 MG capsule Take 1 capsule (60 mg total) by mouth daily. 30 capsule 3  . folic acid (FOLVITE) 1 MG tablet Take 1 tablet (1 mg total) by mouth daily. 90 tablet 3  . Multiple Vitamin (MULTIVITAMIN) tablet Take 1 tablet by mouth daily.    . naproxen (NAPROSYN) 500 MG tablet Take 1 tablet (500 mg total) by mouth 2 (two) times daily as needed for moderate pain. 60 tablet 2  . ondansetron (ZOFRAN) 4 MG tablet Take 1 tablet (4 mg total) by mouth every 8 (eight) hours as needed for nausea or vomiting. 20 tablet 3  . oxyCODONE-acetaminophen (PERCOCET) 7.5-325 MG tablet Take 1 tablet by mouth every 4 (four) hours as needed for severe pain. 20 tablet 0  . pantoprazole (PROTONIX) 40 MG tablet Take 1 tablet (40 mg total) by mouth daily. 30 tablet 3  . polyethylene glycol (MIRALAX) 17 g packet Take 17 g by mouth daily as needed for moderate constipation. 30 each 0  . pregabalin (LYRICA) 150 MG capsule Take 1 capsule (150 mg total) by mouth 2 (  two) times daily. For nerve pain 180 capsule 1  . rOPINIRole (REQUIP) 1 MG tablet Take 1 tablet (1 mg total) by mouth at bedtime. 30 tablet 2  . tamsulosin (FLOMAX) 0.4 MG CAPS capsule Take 1 capsule (0.4 mg total) by mouth daily. 30 capsule 3  . thiamine 100 MG tablet Take 1 tablet (100 mg total) by mouth daily. 30 tablet 6  . tiZANidine (ZANAFLEX) 4 MG tablet One or two pills at bedtime as needed for muscle spasm (Patient taking differently: Take 4 mg by mouth every 6 (six) hours  as needed for muscle spasms. One or two pills at bedtime as needed for muscle spasm) 60 tablet 4  . vitamin B-12 (CYANOCOBALAMIN) 1000 MCG tablet Take 1 tablet (1,000 mcg total) by mouth daily. Due to vitamin B12 deficiency 30 tablet 6   No current facility-administered medications for this visit.    Physical Exam:     BP 112/74   Pulse 94   Ht 5' 8" (1.727 m)   Wt 158 lb 2 oz (71.7 kg)   BMI 24.04 kg/m   GENERAL:  Pleasant male in NAD PSYCH: : Cooperative, normal affect EENT:  conjunctiva pink, mucous membranes moist, neck supple without masses CARDIAC:  RRR, no murmur heard, no peripheral edema PULM: Normal respiratory effort, lungs CTA bilaterally, no wheezing ABDOMEN: Mild TTP in MEG without rebound or guarding.  No peritoneal signs.  Nondistended, soft. No obvious masses, no hepatomegaly,  normal bowel sounds SKIN:  turgor, no lesions seen Musculoskeletal:  Normal muscle tone, normal strength NEURO: Alert and oriented x 3, no focal neurologic deficits   IMPRESSION and PLAN:    1) Acute recurrent pancreatitis 2) Epigastric pain 3) Early satiety 4) Postprandial fecal urgency 5) Nausea without vomiting  - Repeat CMP - Check lipase - Check fecal elastase  - Trial probiotic - MRI/MRCP - Depending on results, may need referral for EUS -Congratulated him on his continued abstinence from EtOH  6) Tobacco use disorder -Extensively counseled patient on smoking cessation, as this continues to place him at risk for acute recurrent pancreatitis, along with significant benefits from a cardiovascular pulmonary standpoint with quitting.  He understands and would like to strongly consider this.  To reach out to me or his PCM if he needs further assistance in quitting  I spent 35 minutes of time, including in depth chart review, independent review of results as outlined above, communicating results with the patient directly, face-to-face time with the patient, coordinating care, and  ordering studies and medications as appropriate, and documentation.    RTC in 3 months or sooner as needed     Lavena Bullion ,DO, FACG 02/05/2021, 10:46 AM

## 2021-02-05 NOTE — Patient Instructions (Signed)
If you are age 56 or older, your body mass index should be between 23-30. Your Body mass index is 24.04 kg/m. If this is out of the aforementioned range listed, please consider follow up with your Primary Care Provider.  If you are age 65 or younger, your body mass index should be between 19-25. Your Body mass index is 24.04 kg/m. If this is out of the aformentioned range listed, please consider follow up with your Primary Care Provider.   You will be contacted by [facility] in the next 2 days to arrange an MRI.  The number on your caller ID will be [phone], please answer when they call.  If you have not heard from them in 2 days please call [phone] to schedule.    Please go to the 2nd floor of this building today and schedule your labwork, [address], [address].  Please start a probiotic, we recommend Align.  Return to the clinic in 3 months which will be around June 2022. Please call to schedule this appointment at [phone].  Due to recent changes in healthcare laws, you may see the results of your imaging and laboratory studies on MyChart before your provider has had a chance to review them.  We understand that in some cases there may be results that are confusing or concerning to you. Not all laboratory results come back in the same time frame and the provider may be waiting for multiple results in order to interpret others.  Please give [place] 48 hours in order for your provider to thoroughly review all the results before contacting the office for clarification of your results.   Thank you for choosing me and [facility].  Vito Cirigliano, D.O.

## 2021-02-07 ENCOUNTER — Other Ambulatory Visit: Payer: Self-pay

## 2021-02-07 ENCOUNTER — Other Ambulatory Visit (INDEPENDENT_AMBULATORY_CARE_PROVIDER_SITE_OTHER): Payer: No Typology Code available for payment source

## 2021-02-07 DIAGNOSIS — K859 Acute pancreatitis without necrosis or infection, unspecified: Secondary | ICD-10-CM

## 2021-02-07 DIAGNOSIS — R110 Nausea: Secondary | ICD-10-CM

## 2021-02-07 DIAGNOSIS — R1013 Epigastric pain: Secondary | ICD-10-CM

## 2021-02-11 ENCOUNTER — Telehealth: Payer: Self-pay | Admitting: Critical Care Medicine

## 2021-02-11 NOTE — Telephone Encounter (Signed)
Called patient and LVM advising patient that Dr. Joya Gaskins would not be available to complete his upcoming appointment on 3/17 in person but would be available for a virtual visit. Advised patient his appt has been changed to virtual for Thursday, does not need to come into the office, but if he prefers to reschedule for another [place] he can give [place] a call at [phone] to reschedule.

## 2021-02-14 ENCOUNTER — Encounter: Payer: Self-pay | Admitting: Critical Care Medicine

## 2021-02-14 ENCOUNTER — Ambulatory Visit: Payer: Self-pay | Attending: Critical Care Medicine | Admitting: Critical Care Medicine

## 2021-02-14 ENCOUNTER — Other Ambulatory Visit: Payer: Self-pay

## 2021-02-14 ENCOUNTER — Other Ambulatory Visit: Payer: Self-pay | Admitting: Critical Care Medicine

## 2021-02-14 ENCOUNTER — Telehealth: Payer: Self-pay | Admitting: Critical Care Medicine

## 2021-02-14 DIAGNOSIS — M792 Neuralgia and neuritis, unspecified: Secondary | ICD-10-CM

## 2021-02-14 DIAGNOSIS — M5442 Lumbago with sciatica, left side: Secondary | ICD-10-CM

## 2021-02-14 DIAGNOSIS — K8500 Idiopathic acute pancreatitis without necrosis or infection: Secondary | ICD-10-CM

## 2021-02-14 DIAGNOSIS — G2581 Restless legs syndrome: Secondary | ICD-10-CM

## 2021-02-14 DIAGNOSIS — F17200 Nicotine dependence, unspecified, uncomplicated: Secondary | ICD-10-CM

## 2021-02-14 DIAGNOSIS — G629 Polyneuropathy, unspecified: Secondary | ICD-10-CM

## 2021-02-14 DIAGNOSIS — F1021 Alcohol dependence, in remission: Secondary | ICD-10-CM

## 2021-02-14 DIAGNOSIS — G8929 Other chronic pain: Secondary | ICD-10-CM

## 2021-02-14 DIAGNOSIS — M5441 Lumbago with sciatica, right side: Secondary | ICD-10-CM

## 2021-02-14 DIAGNOSIS — J449 Chronic obstructive pulmonary disease, unspecified: Secondary | ICD-10-CM

## 2021-02-14 DIAGNOSIS — Z8719 Personal history of other diseases of the digestive system: Secondary | ICD-10-CM

## 2021-02-14 DIAGNOSIS — E538 Deficiency of other specified B group vitamins: Secondary | ICD-10-CM

## 2021-02-14 DIAGNOSIS — N401 Enlarged prostate with lower urinary tract symptoms: Secondary | ICD-10-CM

## 2021-02-14 DIAGNOSIS — R110 Nausea: Secondary | ICD-10-CM

## 2021-02-14 DIAGNOSIS — M48062 Spinal stenosis, lumbar region with neurogenic claudication: Secondary | ICD-10-CM

## 2021-02-14 DIAGNOSIS — R351 Nocturia: Secondary | ICD-10-CM

## 2021-02-14 DIAGNOSIS — F0631 Mood disorder due to known physiological condition with depressive features: Secondary | ICD-10-CM

## 2021-02-14 LAB — PANCREATIC ELASTASE, FECAL: Pancreatic Elastase-1, Stool: 370 mcg/g

## 2021-02-14 MED ORDER — ONDANSETRON HCL 4 MG PO TABS: 4.0000 mg | ORAL_TABLET | Freq: Three times a day (TID) | ORAL | 3 refills | Status: DC | PRN

## 2021-02-14 MED ORDER — OXYCODONE-ACETAMINOPHEN 7.5-325 MG PO TABS: 1.0000 | ORAL_TABLET | Freq: Four times a day (QID) | ORAL | 0 refills | Status: DC | PRN

## 2021-02-14 MED ORDER — THIAMINE HCL 100 MG PO TABS: 100.0000 mg | ORAL_TABLET | Freq: Every day | ORAL | 6 refills | Status: DC

## 2021-02-14 MED ORDER — NAPROXEN 500 MG PO TABS: 500.0000 mg | ORAL_TABLET | Freq: Two times a day (BID) | ORAL | 2 refills | Status: DC | PRN

## 2021-02-14 MED ORDER — PANTOPRAZOLE SODIUM 40 MG PO TBEC: 40.0000 mg | DELAYED_RELEASE_TABLET | Freq: Every day | ORAL | 3 refills | Status: DC

## 2021-02-14 MED ORDER — TIZANIDINE HCL 4 MG PO TABS: 4.0000 mg | ORAL_TABLET | Freq: Four times a day (QID) | ORAL | 1 refills | Status: DC | PRN

## 2021-02-14 MED ORDER — ROPINIROLE HCL 1 MG PO TABS: 1.0000 mg | ORAL_TABLET | Freq: Every day | ORAL | 2 refills | Status: DC

## 2021-02-14 MED ORDER — PREGABALIN 150 MG PO CAPS: 150.0000 mg | ORAL_CAPSULE | Freq: Two times a day (BID) | ORAL | 1 refills | Status: DC

## 2021-02-14 MED ORDER — TAMSULOSIN HCL 0.4 MG PO CAPS: 0.4000 mg | ORAL_CAPSULE | Freq: Every day | ORAL | 3 refills | Status: DC

## 2021-02-14 MED ORDER — FOLIC ACID 1 MG PO TABS: 1.0000 mg | ORAL_TABLET | Freq: Every day | ORAL | 3 refills | Status: DC

## 2021-02-14 MED ORDER — DULOXETINE HCL 60 MG PO CPEP: 60.0000 mg | ORAL_CAPSULE | Freq: Every day | ORAL | 3 refills | Status: DC

## 2021-02-14 MED ORDER — ALBUTEROL SULFATE HFA 108 (90 BASE) MCG/ACT IN AERS: 2.0000 | INHALATION_SPRAY | Freq: Four times a day (QID) | RESPIRATORY_TRACT | 0 refills | Status: DC | PRN

## 2021-02-14 MED ORDER — BUDESONIDE-FORMOTEROL FUMARATE 160-4.5 MCG/ACT IN AERO: 2.0000 | INHALATION_SPRAY | Freq: Two times a day (BID) | RESPIRATORY_TRACT | 12 refills | Status: DC

## 2021-02-14 MED ORDER — NICOTINE POLACRILEX 4 MG MT LOZG: LOZENGE | OROMUCOSAL | 4 refills | Status: DC

## 2021-02-14 MED ORDER — VITAMIN B-12 1000 MCG PO TABS: 1000.0000 ug | ORAL_TABLET | Freq: Every day | ORAL | 6 refills | Status: DC

## 2021-02-14 NOTE — Assessment & Plan Note (Signed)
Marland Kitchen  Current smoking consumption amount: 1 pack a [place]  . Dicsussion on advise to quit smoking and smoking impacts: Impacts on lung health  . Patient's willingness to quit: Uncertain  . Methods to quit smoking discussed: Behavioral modification nicotine replacement  . Medication management of smoking session drugs discussed:  . Resources provided:  AVS   . Setting quit date not established  . Follow-up arranged 1 months   Time spent counseling the patient: 5 minutes

## 2021-02-14 NOTE — Progress Notes (Signed)
Subjective:    Patient ID: Wesley [name], male    DOB: September 10, 1966, 55 y.o.   MRN: [id number] Virtual Visit via Telephone Note  I connected with Wesley [name] on 02/14/21 at  9:30 AM EDT by telephone and verified that I am speaking with the correct person using two identifiers.   Consent:  I discussed the limitations, risks, security and privacy concerns of performing an evaluation and management service by telephone and the availability of in person appointments. I also discussed with the patient that there may be a patient responsible charge related to this service. The patient expressed understanding and agreed to proceed.  Location of patient: Patient's at home  Location of provider: I am in my office  Persons participating in the televisit with the patient.   No one else on the call  Initially I had video with the patient I could see him he was in no distress however he could not hear me while he could see me.  I could not hear him  We switched him to a phone visit    History of Present Illness:  55 y.o.M here to est PCP   COPD, neuropathy, smoker , ETOH, chronic pain  12/13/2020 this is a former patient of Wesley [name] comes in today to reestablish care having not been seen in this clinic since 2020.  Patient has a prior history of COPD with spontaneous pneumothorax from an emphysematous bleb, history of active smoking severe alcohol use elevated liver functions chronic hepatitis C with treatment pancreatitis recurrent malnutrition  Patient states he has not been drinking in several years he has had prior history of lumbar disc disease and neck disease.  He is formally been followed by neurosurgery and then more recently by Ortho care.  He is due a COVID booster.  He was seen in the hepatitis C clinic and is finished his course of therapy.  He still smokes 1 pack a Cease of cigarettes.  He was formally on opiates per Wesley [name] but is now off.  He states he has a needle sensation in his  feet up to his knees bilaterally.  He was on vitamin supplementation but is off this medication at this time.  He is not on any medicines at all as he is run out all of his medications and needing refills  On arrival blood pressure is 125/84  Patient seen by neurology in November of last year found to have neurogenic claudication felt to be probably factorial including lumbar radiculopathy history of alcohol use and vitamin deficiency  02/14/2021 Patient seen in follow-up by way of a telephone visit he did not have technology for video.  This patient was previously seen in January to establish primary care.  Subsequent to this visit the patient was admitted to the hospital later in January with acute pancreatitis COPD exacerbation.  The discharge summary is as documented below.   Admit date: 12/30/2020 Discharge date: 01/08/2021  Admitted From: Home Disposition:  Home  Discharge Condition:Stable CODE STATUS:FULL Diet recommendation: Heart Healthy   Brief/Interim Summary: Patient is a 55 year old male with history of COPD, chronic back pain with sciatica, history of alcohol abuse but with abstinence of 2 years now who presented to the emergency department complains of severe upper abdominal pain, low back pain. On presentation he was found to have elevated lipase, CT abdomen/pelvis showed peripancreatic fatty stranding without focal fluid collection or necrosis. He was admitted for the management of acute pancreatitis. Hospital course remarkable  for poor oral intake, fever. GI following,now signed off.  He saw her intake has improved since last 48 hours.  Abdominal pain has been well controlled.  He is medically stable for discharge to home today.  He will follow-up with GI as an outpatient.  Following problems were addressed during his hospitalization:  Acute pancreatitis: Presented with abdominal pain. CT finding as above. Previous history of chronic alcohol abuse now sober for  last 2 years. Triglycerides not elevated. Follow-up CT imaging showed acute nonnecrotizing pancreatitis, worsening the pancreatic tail region.GI consulted and following. Hospital course remarkable for poor oral intake.his oral intake has improved the last 48 hours.GI also considering MRCPand endoscopic ultrasound as an outpatient. We recommend low-fat diet.  Suspected pneumonia:He also developed fever during this hospitalization. Chest x-ray showed bilateral lower lobe infiltrate. He was also having cough, sore throat. Covid testing negative. Started on ceftriaxone and Flagyl for possible aspiration pneumonia. Currently respiratory status stable, saturating fine on room air. Will discontinue antibiotics . Currently afebrile, no leukocytosis. Blood cultures have not shown any growth. He has mild leukocytosis, improved.  Hypokalemia/hypomagnesemia: Supplemented and corrected  Ileus/constipation: Resolved.  He is having bowel movements now.  COPD: Currently stable. Not wheezing  Low back pain: MRI done here showed progressive degenerative changes. Follows with Wesley [name] as an outpatient. Continue supportivecare, pain management. PT recommended outpatient PT.    Discharge Diagnoses:  Principal Problem:   Acute pancreatitis Active Problems:   COPD with chronic bronchitis (HCC)   Alcohol dependence in remission (Wesley [name])   Chronic midline low back pain with bilateral sciatica  Since discharge the patient still is having significant low back pain has been followed up by orthopedics who did not plan any intervention.  He now has [employer] which may make a difference in what type of orthopedic intervention he can receive.  He is yet received physical therapy.  He still has left leg weakness he does not fall but is quite weak and uses a cane to ambulate.  He states he still dyspneic with exertion and was not given any inhaled medications on discharge.  Patient did  have aspiration pneumonia this is been treated he is off all antibiotics.  Patient still has nausea occasional diarrhea.  He still smoking 1 pack a Kallenberger of cigarettes.  His abdominal pain is improving.  He has had gastroenterology follow-up and they are planning an MRCP to evaluate for pancreatic divisum  Patient is needing refills on his medications at this time.  Mrs. Catalina Gravel is not on any inhaled medications.  He still has a swollen degree on his left foot.   Past Medical History:  Diagnosis Date   Acute pancreatitis    Alcohol abuse    Cholecystitis 01/2018   Chronic hepatitis C ([place])    EMPHYSEMATOUS BLEB 08/07/2010   Qualifier: Diagnosis of  By: [name] MD, [name]    History of blood transfusion    Neuropathy    Radial nerve compression    right   Spinal stenosis, lumbar    SPONTANEOUS PNEUMOTHORAX 08/07/2010   Qualifier: History of  By: Tilden Dome       Family History  Problem Relation Age of Onset   Hypertension Mother    Cirrhosis Father 76   Kidney cancer Father    Multiple sclerosis Sister    Liver cancer Maternal Grandmother    Colon cancer Cousin 68       Mother's niece   Esophageal cancer Neg Hx  Stomach cancer Neg Hx    Rectal cancer Neg Hx      Social History   Socioeconomic History   Marital status: Single    Spouse name: Not on file   Number of children: 1   Years of education: 10   Highest education level: Not on file  Occupational History   Occupation: unemployed - fired for drinking on the job    Comment: unemployed  Tobacco Use   Smoking status: Current Every Licausi Smoker    Packs/Gurevich: 0.75    Years: 42.00    Pack years: 31.50    Types: Cigarettes   Smokeless tobacco: Never Used  Scientific laboratory technician Use: Never used  Substance and Sexual Activity   Alcohol use: Not Currently    Alcohol/week: 25.0 standard drinks    Types: 25 Standard drinks or equivalent per week    Comment: stopped 04/2019   Drug use: No    Sexual activity: Yes    Partners: Female    Birth control/protection: None  Other Topics Concern   Not on file  Social History Narrative   Left handed   Lives in a single story home with fiance.   Caffeine- sodas, 7 -8 cans   Social Determinants of Health   Financial Resource Strain: Not on file  Food Insecurity: Not on file  Transportation Needs: Not on file  Physical Activity: Not on file  Stress: Not on file  Social Connections: Not on file  Intimate Partner Violence: Not on file     No Known Allergies   Outpatient Medications Prior to Visit  Medication Sig Dispense Refill   Multiple Vitamin (MULTIVITAMIN) tablet Take 1 tablet by mouth daily.     polyethylene glycol (MIRALAX) 17 g packet Take 17 g by mouth daily as needed for moderate constipation. 30 each 0   DULoxetine (CYMBALTA) 60 MG capsule Take 1 capsule (60 mg total) by mouth daily. 30 capsule 3   folic acid (FOLVITE) 1 MG tablet Take 1 tablet (1 mg total) by mouth daily. 90 tablet 3   naproxen (NAPROSYN) 500 MG tablet Take 1 tablet (500 mg total) by mouth 2 (two) times daily as needed for moderate pain. 60 tablet 2   ondansetron (ZOFRAN) 4 MG tablet Take 1 tablet (4 mg total) by mouth every 8 (eight) hours as needed for nausea or vomiting. 20 tablet 3   oxyCODONE-acetaminophen (PERCOCET) 7.5-325 MG tablet Take 1 tablet by mouth every 4 (four) hours as needed for severe pain. 20 tablet 0   pantoprazole (PROTONIX) 40 MG tablet Take 1 tablet (40 mg total) by mouth daily. 30 tablet 3   pregabalin (LYRICA) 150 MG capsule Take 1 capsule (150 mg total) by mouth 2 (two) times daily. For nerve pain 180 capsule 1   rOPINIRole (REQUIP) 1 MG tablet Take 1 tablet (1 mg total) by mouth at bedtime. 30 tablet 2   tamsulosin (FLOMAX) 0.4 MG CAPS capsule Take 1 capsule (0.4 mg total) by mouth daily. 30 capsule 3   thiamine 100 MG tablet Take 1 tablet (100 mg total) by mouth daily. 30 tablet 6   tiZANidine (ZANAFLEX) 4 MG  tablet One or two pills at bedtime as needed for muscle spasm (Patient taking differently: Take 4 mg by mouth every 6 (six) hours as needed for muscle spasms. One or two pills at bedtime as needed for muscle spasm) 60 tablet 4   vitamin B-12 (CYANOCOBALAMIN) 1000 MCG tablet Take 1 tablet (1,000 mcg  total) by mouth daily. Due to vitamin B12 deficiency 30 tablet 6   No facility-administered medications prior to visit.      Review of Systems  Constitutional: Negative for fever.  HENT: Positive for postnasal drip and rhinorrhea.   Respiratory: Positive for cough, shortness of breath and wheezing.   Cardiovascular: Positive for chest pain and leg swelling.  Gastrointestinal: Positive for abdominal pain, diarrhea and nausea. Negative for vomiting.  Genitourinary: Negative.   Musculoskeletal: Positive for back pain and gait problem.  Skin:       itch       Objective:   Physical Exam There were no vitals filed for this visit. No exam this is a phone note      Assessment & Plan:  I personally reviewed all images and lab data in the [facility] system as well as any outside material available during this office visit and agree with the  radiology impressions.   COPD with chronic bronchitis ([place]) COPD previously documented  Plan is to pursue smoking cessation and begin Symbicort 162 puffs twice daily and as needed albuterol  Acute pancreatitis Slowly improving is on probiotic not requiring Pancrease  Follow-up MRCP follow-up with gastroenterology  Patient is no longer drinking alcohol  Chronic midline low back pain with bilateral sciatica Has had follow-up with orthopedics no surgical interventions recommended  We will pursue physical therapy  I did give the patient a short course of Percocet 5 mg/325 he will be referred to pain management  [facility] drug database was reviewed  Alcohol dependence in remission [facility]) Not actively drinking alcohol at this  time  SMOKER     Current smoking consumption amount: 1 pack a Gessel   Dicsussion on advise to quit smoking and smoking impacts: Impacts on lung health   Patient's willingness to quit: Uncertain   Methods to quit smoking discussed: Behavioral modification nicotine replacement   Medication management of smoking session drugs discussed:   Resources provided:  AVS    Setting quit date not established   Follow-up arranged 1 months   Time spent counseling the patient: 5 minutes    Diagnoses and all orders for this visit:  Neurogenic claudication due to lumbar spinal stenosis -     DULoxetine (CYMBALTA) 60 MG capsule; Take 1 capsule (60 mg total) by mouth daily. -     naproxen (NAPROSYN) 500 MG tablet; Take 1 tablet (500 mg total) by mouth 2 (two) times daily as needed for moderate pain. -     pregabalin (LYRICA) 150 MG capsule; Take 1 capsule (150 mg total) by mouth 2 (two) times daily. For nerve pain -     tiZANidine (ZANAFLEX) 4 MG tablet; Take 1 tablet (4 mg total) by mouth every 6 (six) hours as needed for muscle spasms. One or two pills at bedtime as needed for muscle spasm -     Ambulatory referral to Physical Therapy  Depression due to physical illness -     DULoxetine (CYMBALTA) 60 MG capsule; Take 1 capsule (60 mg total) by mouth daily.  Chronic midline low back pain with bilateral sciatica -     naproxen (NAPROSYN) 500 MG tablet; Take 1 tablet (500 mg total) by mouth 2 (two) times daily as needed for moderate pain. -     tiZANidine (ZANAFLEX) 4 MG tablet; Take 1 tablet (4 mg total) by mouth every 6 (six) hours as needed for muscle spasms. One or two pills at bedtime as needed for muscle spasm -  Ambulatory referral to Physical Therapy  Nausea -     ondansetron (ZOFRAN) 4 MG tablet; Take 1 tablet (4 mg total) by mouth every 8 (eight) hours as needed for nausea or vomiting. -     pantoprazole (PROTONIX) 40 MG tablet; Take 1 tablet (40 mg total) by mouth  daily.  Neuropathic pain -     pregabalin (LYRICA) 150 MG capsule; Take 1 capsule (150 mg total) by mouth 2 (two) times daily. For nerve pain -     Ambulatory referral to Physical Therapy  Neuropathy -     rOPINIRole (REQUIP) 1 MG tablet; Take 1 tablet (1 mg total) by mouth at bedtime.  Restless leg syndrome -     rOPINIRole (REQUIP) 1 MG tablet; Take 1 tablet (1 mg total) by mouth at bedtime.  Nocturia associated with benign prostatic hyperplasia -     tamsulosin (FLOMAX) 0.4 MG CAPS capsule; Take 1 capsule (0.4 mg total) by mouth daily.  History of pancreatitis -     thiamine 100 MG tablet; Take 1 tablet (100 mg total) by mouth daily.  Vitamin B12 deficiency -     vitamin B-12 (CYANOCOBALAMIN) 1000 MCG tablet; Take 1 tablet (1,000 mcg total) by mouth daily. Due to vitamin B12 deficiency  COPD with chronic bronchitis (HCC)  Idiopathic acute pancreatitis without infection or necrosis  Alcohol dependence in remission ([place])  SMOKER  Other orders -     folic acid (FOLVITE) 1 MG tablet; Take 1 tablet (1 mg total) by mouth daily. -     nicotine polacrilex (NICORETTE MINI) 4 MG lozenge; Take three a Gunn to stop smoking -     oxyCODONE-acetaminophen (PERCOCET) 7.5-325 MG tablet; Take 1 tablet by mouth every 6 (six) hours as needed for severe pain. -     budesonide-formoterol (SYMBICORT) 160-4.5 MCG/ACT inhaler; Inhale 2 puffs into the lungs 2 (two) times daily. -     albuterol (VENTOLIN HFA) 108 (90 Base) MCG/ACT inhaler; Inhale 2 puffs into the lungs every 6 (six) hours as needed for wheezing or shortness of breath.     Follow Up Instructions: Patient knows refills were sent to the pharmacy he will have a physical therapy consult and he will return to see me in short-term follow-up in a month   I discussed the assessment and treatment plan with the patient. The patient was provided an opportunity to ask questions and all were answered. The patient agreed with the plan and  demonstrated an understanding of the instructions.   The patient was advised to call back or seek an in-person evaluation if the symptoms worsen or if the condition fails to improve as anticipated.  I provided 79mnutes of non-face-to-face time during this encounter  including  median intraservice time , review of notes, labs, imaging, medications  and explaining diagnosis and management to the patient .    PAsencion Noble MD

## 2021-02-14 NOTE — Assessment & Plan Note (Signed)
COPD previously documented  Plan is to pursue smoking cessation and begin Symbicort 162 puffs twice daily and as needed albuterol

## 2021-02-14 NOTE — Assessment & Plan Note (Signed)
Not actively drinking alcohol at this time

## 2021-02-14 NOTE — Assessment & Plan Note (Signed)
Has had follow-up with orthopedics no surgical interventions recommended  We will pursue physical therapy  I did give the patient a short course of Percocet 5 mg/325 he will be referred to pain management  [facility] drug database was reviewed

## 2021-02-14 NOTE — Telephone Encounter (Signed)
-----  Message from [name], MD sent at 02/14/2021 10:00 AM EDT ----- Regarding: f/u appt Needs appt with me in first two weeks April  face to face

## 2021-02-14 NOTE — Assessment & Plan Note (Signed)
Slowly improving is on probiotic not requiring Pancrease  Follow-up MRCP follow-up with gastroenterology  Patient is no longer drinking alcohol

## 2021-02-14 NOTE — Telephone Encounter (Signed)
Patient has been scheduled for April 13 with Dr. Joya Gaskins

## 2021-02-18 ENCOUNTER — Other Ambulatory Visit: Payer: Self-pay | Admitting: Critical Care Medicine

## 2021-02-18 DIAGNOSIS — M48062 Spinal stenosis, lumbar region with neurogenic claudication: Secondary | ICD-10-CM

## 2021-02-18 DIAGNOSIS — M792 Neuralgia and neuritis, unspecified: Secondary | ICD-10-CM

## 2021-02-18 MED ORDER — PREGABALIN 150 MG PO CAPS: 150.0000 mg | ORAL_CAPSULE | Freq: Two times a day (BID) | ORAL | 1 refills | Status: DC

## 2021-02-18 MED FILL — PREGABALIN 150 MG CAPS: 150 | 30 days supply | Qty: 60 | Fill #0

## 2021-02-21 ENCOUNTER — Other Ambulatory Visit: Payer: Self-pay | Admitting: Gastroenterology

## 2021-02-21 ENCOUNTER — Ambulatory Visit (HOSPITAL_COMMUNITY)
Admission: RE | Admit: 2021-02-21 | Discharge: 2021-02-21 | Disposition: A | Discharge status: Home / Self Care | Payer: Self-pay | Source: Ambulatory Visit | Attending: Gastroenterology | Admitting: Gastroenterology

## 2021-02-21 ENCOUNTER — Other Ambulatory Visit: Payer: Self-pay

## 2021-02-21 DIAGNOSIS — K859 Acute pancreatitis without necrosis or infection, unspecified: Secondary | ICD-10-CM | POA: Insufficient documentation

## 2021-02-21 DIAGNOSIS — R1013 Epigastric pain: Secondary | ICD-10-CM

## 2021-02-21 DIAGNOSIS — R110 Nausea: Secondary | ICD-10-CM | POA: Insufficient documentation

## 2021-02-21 MED ORDER — GADOBUTROL 1 MMOL/ML IV SOLN
6.0000 mL | Freq: Once | INTRAVENOUS | Status: AC | PRN
  Administered 2021-02-21: 6 mL via INTRAVENOUS

## 2021-03-01 ENCOUNTER — Telehealth: Payer: Self-pay

## 2021-03-01 NOTE — Telephone Encounter (Signed)
-----  Message from Irving Copas., MD sent at 02/28/2021  5:09 PM EDT ----- Chong Sicilian, please move forward with scheduling this patient an EUS for rule out choledocholithiasis, rule out autoimmune pancreatitis (possible FNA/FNB).  Can be performed by DJ or myself.  Thanks. GM  ----- Message ----- From: Lavena Bullion, DO Sent: 02/28/2021   5:03 PM EDT To: Milus Banister, MD, Timothy Lasso, RN, #  Thank you for looking at his case and your input! I talked to him on the phone, and he is agreeable with proceeding direct to EUS.   Good point on exploring with [facility] if issues persist and no clear dx.   Thanks!  VC  ----- Message ----- From: Irving Copas., MD Sent: 02/28/2021   4:55 PM EDT To: Milus Banister, MD, Timothy Lasso, RN, #  Interesting case and imaging. EUS is reasonable to evaluate the area, rule out retained small bile duct stone (less likely with normal LFTs) and consider FNB to rule out malignancy/Autoimmune Pancreatitis (less so with normal IgG4).  Needs to stop smoking.   If patient agrees VC, then let Lakyia Behe know and we can set up for an EUS with DJ or myself. If his issues persist, then would query [facility] to evaluate for Functional Disorder of the Sphincter and do Manometry to rule out idiopathic pancreatitis/SOD as cause for symptoms as long as EUS is unremarkable. Thanks. GM ----- Message ----- From: Lavena Bullion, DO Sent: 02/28/2021   4:16 PM EDT To: Milus Banister, MD, #  Gents-  Interesting case I'd like to pick your collective brains on.  55 year old male with previous admissions for acute recurrent pancreatitis.  Does have a history of EtOH abuse, but quit drinking in 2019.  Does still smoke 1 PPD (counseled to quit a few times by me).  Work-up for acute recurrent pancreatitis otherwise unrevealing, to include IgG4, HIV, hyper-TG.  Has had prior ccy.  Does have ongoing, intermittent epigastric pain, but otherwise normal  pancreatic elastase, lipase, and his CBC and CMP have normalized.  MRI/MRCP with an indeterminate lesion in HOP measuring 3.3 x 2.2 x 2.3 cm without PD dilation.  There is some focal narrowing of the CBD at the level of the ampulla. I'm not sure if it is from extraluminal compression from this HOP finding or a small periampullary etiology.  Suppose the HOP finding could be some leftover groove pancreatitis, or a focal autoimmune pancreatitis as suggested by the Radiologist, but suspect nonemergent EUS might help [place] differentiate.  Alternatively, I could do another MRI/MRCP in 3-6 months.  As always, I really appreciate your help!  Vito   ----- Message ----- From: Interface, Rad Results In Sent: 02/22/2021   8:04 AM EDT To: Lavena Bullion, DO

## 2021-03-04 ENCOUNTER — Other Ambulatory Visit: Payer: Self-pay

## 2021-03-04 DIAGNOSIS — K859 Acute pancreatitis without necrosis or infection, unspecified: Secondary | ICD-10-CM

## 2021-03-04 NOTE — Telephone Encounter (Signed)
EUS has been scheduled for 03/21/21 at 730 am at [facility].  COVID test on 03/18/21 at 1010 am.    Left message on machine to call back

## 2021-03-05 ENCOUNTER — Ambulatory Visit: Payer: Medicare Other | Attending: Critical Care Medicine | Admitting: Physical Therapy

## 2021-03-05 ENCOUNTER — Other Ambulatory Visit: Payer: Self-pay

## 2021-03-05 ENCOUNTER — Encounter: Payer: Self-pay | Admitting: Physical Therapy

## 2021-03-05 DIAGNOSIS — G8929 Other chronic pain: Secondary | ICD-10-CM

## 2021-03-05 DIAGNOSIS — M6281 Muscle weakness (generalized): Secondary | ICD-10-CM | POA: Diagnosis not present

## 2021-03-05 DIAGNOSIS — R262 Difficulty in walking, not elsewhere classified: Secondary | ICD-10-CM

## 2021-03-05 DIAGNOSIS — M5441 Lumbago with sciatica, right side: Secondary | ICD-10-CM | POA: Insufficient documentation

## 2021-03-05 DIAGNOSIS — M5442 Lumbago with sciatica, left side: Secondary | ICD-10-CM | POA: Insufficient documentation

## 2021-03-05 DIAGNOSIS — M546 Pain in thoracic spine: Secondary | ICD-10-CM

## 2021-03-05 DIAGNOSIS — R252 Cramp and spasm: Secondary | ICD-10-CM | POA: Diagnosis not present
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
## 2021-03-05 NOTE — Patient Instructions (Signed)
Wesley [name], PT, [place] Certified Exercise Expert for the Aging Adult  03/05/21 11:47 AM Phone: [phone] Fax: [phone]

## 2021-03-05 NOTE — Telephone Encounter (Signed)
EUS scheduled, pt instructed and medications reviewed.  Patient instructions mailed to home and sent to My Chart .  Patient to call with any questions or concerns.

## 2021-03-13 ENCOUNTER — Other Ambulatory Visit: Payer: Self-pay

## 2021-03-13 ENCOUNTER — Ambulatory Visit: Payer: Self-pay | Attending: Critical Care Medicine | Admitting: Critical Care Medicine

## 2021-03-13 ENCOUNTER — Encounter: Payer: Self-pay | Admitting: Critical Care Medicine

## 2021-03-13 VITALS — BP 108/68 | HR 88 | Ht 68.0 in | Wt 157.4 lb

## 2021-03-13 DIAGNOSIS — G894 Chronic pain syndrome: Secondary | ICD-10-CM

## 2021-03-13 DIAGNOSIS — J449 Chronic obstructive pulmonary disease, unspecified: Secondary | ICD-10-CM

## 2021-03-13 DIAGNOSIS — M5442 Lumbago with sciatica, left side: Secondary | ICD-10-CM

## 2021-03-13 DIAGNOSIS — G621 Alcoholic polyneuropathy: Secondary | ICD-10-CM

## 2021-03-13 DIAGNOSIS — B182 Chronic viral hepatitis C: Secondary | ICD-10-CM

## 2021-03-13 DIAGNOSIS — F17200 Nicotine dependence, unspecified, uncomplicated: Secondary | ICD-10-CM

## 2021-03-13 DIAGNOSIS — I739 Peripheral vascular disease, unspecified: Secondary | ICD-10-CM | POA: Insufficient documentation

## 2021-03-13 DIAGNOSIS — K860 Alcohol-induced chronic pancreatitis: Secondary | ICD-10-CM | POA: Insufficient documentation

## 2021-03-13 DIAGNOSIS — G8929 Other chronic pain: Secondary | ICD-10-CM

## 2021-03-13 DIAGNOSIS — M5441 Lumbago with sciatica, right side: Secondary | ICD-10-CM

## 2021-03-13 DIAGNOSIS — E440 Moderate protein-calorie malnutrition: Secondary | ICD-10-CM

## 2021-03-13 MED ORDER — OXYCODONE-ACETAMINOPHEN 7.5-325 MG PO TABS: 1.0000 | ORAL_TABLET | Freq: Four times a day (QID) | ORAL | 0 refills | Status: DC | PRN

## 2021-03-13 MED FILL — Pregabalin Cap 150 MG: ORAL | 30 days supply | Qty: 60 | Fill #0 | Status: AC

## 2021-03-13 NOTE — Assessment & Plan Note (Signed)
No longer on antivirals RNA quantitative is negative

## 2021-03-13 NOTE — Progress Notes (Signed)
Left leg and lower back pain

## 2021-03-13 NOTE — Assessment & Plan Note (Signed)
Improved on Symbicort

## 2021-03-13 NOTE — Assessment & Plan Note (Signed)
Referral to pain management clinic given was bridged with [facility] drug database was reviewed no multiple prescribers seen

## 2021-03-13 NOTE — Assessment & Plan Note (Signed)
Chronic mid back pain follow-up with physical therapy

## 2021-03-13 NOTE — Patient Instructions (Signed)
Keep your appointment with gastroenterology for your endoscopy  Keep your upcoming physical therapy appointments  Please let us know your insurance card number so that we can put this into the computer for billing purposes in referral purposes  We gave you a dental resource sheet for practices to attend to watch her pancreas condition improves  I refilled your Percocet sent this to the [facility] pharmacy as a one-time fill on your now can be referred to pain management clinic  Reduce your tobacco intake and please get the nicotine lozenges we recommend at the last visit  Return to see Dr. Joya Gaskins 3 months   Tobacco Use Disorder Tobacco use disorder (TUD) occurs when a person craves, seeks, and uses tobacco, regardless of the consequences. This disorder can cause problems with mental and physical health. It can affect your ability to have healthy relationships, and it can keep you from meeting your responsibilities at work, home, or school. Tobacco may be:  Smoked as a cigarette or cigar.  Inhaled using e-cigarettes.  Smoked in a pipe or hookah.  Chewed as smokeless tobacco.  Inhaled into the nostrils as snuff. Tobacco products contain a dangerous chemical called nicotine, which is very addictive. Nicotine triggers hormones that make the body feel stimulated and works on areas of the brain that make you feel good. These effects can make it hard for people to quit nicotine. Tobacco contains many other unsafe chemicals that can damage almost every organ in the body. Smoking tobacco also puts others in danger due to fire risk and possible health problems caused by breathing in secondhand smoke. What are the signs or symptoms? Symptoms of TUD may include:  Being unable to slow down or stop your tobacco use.  Spending an abnormal amount of time getting or using tobacco.  Craving tobacco. Cravings may last for up to 6 months after quitting.  Tobacco use that: ? Interferes with your work,  school, or home life. ? Interferes with your personal and social relationships. ? Makes you give up activities that you once enjoyed or found important.  Using tobacco even though you know that it is: ? Dangerous or bad for your health or someone else's health. ? Causing problems in your life.  Needing more and more of the substance to get the same effect (developing tolerance).  Experiencing unpleasant symptoms if you do not use the substance (withdrawal). Withdrawal symptoms may include: ? Depressed, anxious, or irritable mood. ? Difficulty concentrating. ? Increased appetite. ? Restlessness or trouble sleeping.  Using the substance to avoid withdrawal. How is this diagnosed? This condition may be diagnosed based on:  Your current and past tobacco use. Your health care provider may ask questions about how your tobacco use affects your life.  A physical exam. You may be diagnosed with TUD if you have at least two symptoms within a 67-monthperiod. How is this treated? This condition is treated by stopping tobacco use. Many people are unable to quit on their own and need help. Treatment may include:  Nicotine replacement therapy (NRT). NRT provides nicotine without the other harmful chemicals in tobacco. NRT gradually lowers the dosage of nicotine in the body and reduces withdrawal symptoms. NRT is available as: ? Over-the-counter gums, lozenges, and skin patches. ? Prescription mouth inhalers and nasal sprays.  Medicine that acts on the brain to reduce cravings and withdrawal symptoms.  A type of talk therapy that examines your triggers for tobacco use, how to avoid them, and how to cope  with cravings (behavioral therapy).  Hypnosis. This may help with withdrawal symptoms.  Joining a support group for others coping with TUD. The best treatment for TUD is usually a combination of medicine, talk therapy, and support groups. Recovery can be a long process. Many people start using  tobacco again after stopping (relapse). If you relapse, it does not mean that treatment will not work. Follow these instructions at home: Lifestyle  Do not use any products that contain nicotine or tobacco, such as cigarettes and e-cigarettes.  Avoid things that trigger tobacco use as much as you can. Triggers include people and situations that usually cause you to use tobacco.  Avoid drinks that contain caffeine, including coffee. These may worsen some withdrawal symptoms.  Find ways to manage stress. Wanting to smoke may cause stress, and stress can make you want to smoke. Relaxation techniques such as deep breathing, meditation, and yoga may help.  Attend support groups as needed. These groups are an important part of long-term recovery for many people. General instructions  Take over-the-counter and prescription medicines only as told by your health care provider.  Check with your health care provider before taking any new prescription or over-the-counter medicines.  Decide on a friend, family member, or smoking quit-line (such as 1-800-QUIT-NOW in the [place] that you can call or text when you feel the urge to smoke or when you need help coping with cravings.  Keep all follow-up visits as told by your health care provider and therapist. This is important.   Contact a health care provider if:  You are not able to take your medicines as prescribed.  Your symptoms get worse, even with treatment. Summary  Tobacco use disorder (TUD) occurs when a person craves, seeks, and uses tobacco regardless of the consequences.  This condition may be diagnosed based on your current and past tobacco use and a physical exam.  Many people are unable to quit on their own and need help. Recovery can be a long process.  The most effective treatment for TUD is usually a combination of medicine, talk therapy, and support groups. This information is not intended to replace advice given to you by your  health care provider. Make sure you discuss any questions you have with your health care provider. Document Revised: 11/04/2017 Document Reviewed: 11/04/2017 Elsevier Patient Education  2021 [employer].

## 2021-03-13 NOTE — Progress Notes (Signed)
Subjective:    Patient ID: Wesley [name], male    DOB: 02-20-1966, 55 y.o.   MRN: [id number]  History of Present Illness:  55 y.o.M here to est PCP   COPD, neuropathy, smoker , ETOH, chronic pain  12/13/2020 this is a former patient of Dr. Chapman [name] comes in today to reestablish care having not been seen in this clinic since 2020.  Patient has a prior history of COPD with spontaneous pneumothorax from an emphysematous bleb, history of active smoking severe alcohol use elevated liver functions chronic hepatitis C with treatment pancreatitis recurrent malnutrition  Patient states he has not been drinking in several years he has had prior history of lumbar disc disease and neck disease.  He is formally been followed by neurosurgery and then more recently by Ortho care.  He is due a COVID booster.  He was seen in the hepatitis C clinic and is finished his course of therapy.  He still smokes 1 pack a Wesley [name] of cigarettes.  He was formally on opiates per Dr. Chapman [name] but is now off.  He states he has a needle sensation in his feet up to his knees bilaterally.  He was on vitamin supplementation but is off this medication at this time.  He is not on any medicines at all as he is run out all of his medications and needing refills  On arrival blood pressure is 125/84  Patient seen by neurology in November of last year found to have neurogenic claudication felt to be probably factorial including lumbar radiculopathy history of alcohol use and vitamin deficiency  02/14/2021 Patient seen in follow-up by way of a telephone visit he did not have technology for video.  This patient was previously seen in January to establish primary care.  Subsequent to this visit the patient was admitted to the hospital later in January with acute pancreatitis COPD exacerbation.  The discharge summary is as documented below.   Admit date: 12/30/2020 Discharge date: 01/08/2021  Admitted From: Home Disposition:  Home  Discharge  Condition:Stable CODE STATUS:FULL Diet recommendation: Heart Healthy   Brief/Interim Summary: Patient is a 55 year old male with history of COPD, chronic back pain with sciatica, history of alcohol abuse but with abstinence of 2 years now who presented to the emergency department complains of severe upper abdominal pain, low back pain. On presentation he was found to have elevated lipase, CT abdomen/pelvis showed peripancreatic fatty stranding without focal fluid collection or necrosis. He was admitted for the management of acute pancreatitis. Hospital course remarkable for poor oral intake, fever. GI following,now signed off.  He saw her intake has improved since last 48 hours.  Abdominal pain has been well controlled.  He is medically stable for discharge to home today.  He will follow-up with GI as an outpatient.  Following problems were addressed during his hospitalization:  Acute pancreatitis: Presented with abdominal pain. CT finding as above. Previous history of chronic alcohol abuse now sober for last 2 years. Triglycerides not elevated. Follow-up CT imaging showed acute nonnecrotizing pancreatitis, worsening the pancreatic tail region.GI consulted and following. Hospital course remarkable for poor oral intake.his oral intake has improved the last 48 hours.GI also considering MRCPand endoscopic ultrasound as an outpatient. We recommend low-fat diet.  Suspected pneumonia:He also developed fever during this hospitalization. Chest x-ray showed bilateral lower lobe infiltrate. He was also having cough, sore throat. Covid testing negative. Started on ceftriaxone and Flagyl for possible aspiration pneumonia. Currently respiratory status stable, saturating fine on room air.  Will discontinue antibiotics . Currently afebrile, no leukocytosis. Blood cultures have not shown any growth. He has mild leukocytosis, improved.  Hypokalemia/hypomagnesemia: Supplemented and  corrected  Ileus/constipation: Resolved.  He is having bowel movements now.  COPD: Currently stable. Not wheezing  Low back pain: MRI done here showed progressive degenerative changes. Follows with Dr. Lorin [name] as an outpatient. Continue supportivecare, pain management. PT recommended outpatient PT.    Discharge Diagnoses:  Principal Problem:   Acute pancreatitis Active Problems:   COPD with chronic bronchitis (HCC)   Alcohol dependence in remission ([place])   Chronic midline low back pain with bilateral sciatica  Since discharge the patient still is having significant low back pain has been followed up by orthopedics who did not plan any intervention.  He now has [employer] which may make a difference in what type of orthopedic intervention he can receive.  He is yet received physical therapy.  He still has left leg weakness he does not fall but is quite weak and uses a cane to ambulate.  He states he still dyspneic with exertion and was not given any inhaled medications on discharge.  Patient did have aspiration pneumonia this is been treated he is off all antibiotics.  Patient still has nausea occasional diarrhea.  He still smoking 1 pack a [place] of cigarettes.  His abdominal pain is improving.  He has had gastroenterology follow-up and they are planning an MRCP to evaluate for pancreatic divisum  Patient is needing refills on his medications at this time.  Mrs. Wesley [name] is not on any inhaled medications.  He still has a swollen degree on his left foot.   03/13/2021 Patient seen in return follow-up still complains of severe low back pain does now have [facility] Medicaid.  He is seeking a pain management referral.  He is receiving physical therapy.  Orthopedics is not planning surgery.  On another matter he has acute pancreatitis alcohol induced he is no longer drinking alcohol but now has a masslike density in the head of his  Pancreas.  He has an upper endoscopy ultrasound  pending within the next week.  Patient is using Percocet would like refills on this. Patient's breathing is better on Symbicort  Past Medical History:  Diagnosis Date  . Acute pancreatitis   . Alcohol abuse   . Cholecystitis 01/2018  . Chronic hepatitis C ([place])   . EMPHYSEMATOUS BLEB 08/07/2010   Qualifier: Diagnosis of  By: [name] MD, [name]   . History of blood transfusion   . Neuropathy   . Radial nerve compression    right  . Spinal stenosis, lumbar   . SPONTANEOUS PNEUMOTHORAX 08/07/2010   Qualifier: History of  By: Tilden Dome       Family History  Problem Relation Age of Onset  . Hypertension Mother   . Cirrhosis Father 50  . Kidney cancer Father   . Multiple sclerosis Sister   . Liver cancer Maternal Grandmother   . Colon cancer Cousin 59       Mother's niece  . Esophageal cancer Neg Hx   . Stomach cancer Neg Hx   . Rectal cancer Neg Hx      Social History   Socioeconomic History  . Marital status: Single    Spouse name: Not on file  . Number of children: 1  . Years of education: 80  . Highest education level: Not on file  Occupational History  . Occupation: unemployed - fired for drinking on the job  Comment: unemployed  Tobacco Use  . Smoking status: Current Every [place] Smoker    Packs/Wesby: 0.75    Years: 42.00    Pack years: 31.50    Types: Cigarettes  . Smokeless tobacco: Never Used  Vaping Use  . Vaping Use: Never used  Substance and Sexual Activity  . Alcohol use: Not Currently    Alcohol/week: 25.0 standard drinks    Types: 25 Standard drinks or equivalent per week    Comment: stopped 04/2019  . Drug use: No  . Sexual activity: Yes    Partners: Female    Birth control/protection: None  Other Topics Concern  . Not on file  Social History Narrative   Left handed   Lives in a single story home with fiance.   Caffeine- sodas, 7 -8 cans   Social Determinants of Health   Financial Resource Strain: Not on file  Food Insecurity: Not on  file  Transportation Needs: Not on file  Physical Activity: Not on file  Stress: Not on file  Social Connections: Not on file  Intimate Partner Violence: Not on file     No Known Allergies   Outpatient Medications Prior to Visit  Medication Sig Dispense Refill  . albuterol (VENTOLIN HFA) 108 (90 Base) MCG/ACT inhaler Inhale 2 puffs into the lungs every 6 (six) hours as needed for wheezing or shortness of breath. 8 g 0  . budesonide-formoterol (SYMBICORT) 160-4.5 MCG/ACT inhaler Inhale 2 puffs into the lungs 2 (two) times daily. 1 each 12  . DULoxetine (CYMBALTA) 60 MG capsule Take 1 capsule (60 mg total) by mouth daily. 30 capsule 3  . folic acid (FOLVITE) 1 MG tablet TAKE 1 TABLET (1 MG TOTAL) BY MOUTH DAILY. (Patient taking differently: Take 1 mg by mouth daily.) 90 tablet 3  . Multiple Vitamin (MULTIVITAMIN) tablet Take 1 tablet by mouth daily.    . naproxen (NAPROSYN) 500 MG tablet TAKE 1 TABLET (500 MG TOTAL) BY MOUTH 2 (TWO) TIMES DAILY AS NEEDED FOR MODERATE PAIN. (Patient taking differently: Take 500 mg by mouth 2 (two) times daily as needed for moderate pain.) 60 tablet 2  . nicotine polacrilex (COMMIT) 4 MG lozenge TAKE THREE A Avitabile TO STOP SMOKING 100 lozenge 4  . ondansetron (ZOFRAN) 4 MG tablet TAKE 1 TABLET (4 MG TOTAL) BY MOUTH EVERY 8 (EIGHT) HOURS AS NEEDED FOR NAUSEA OR VOMITING. (Patient taking differently: Take 4 mg by mouth every 8 (eight) hours as needed for nausea or vomiting.) 20 tablet 3  . pantoprazole (PROTONIX) 40 MG tablet TAKE 1 TABLET (40 MG TOTAL) BY MOUTH DAILY. (Patient taking differently: Take 40 mg by mouth daily.) 30 tablet 3  . polyethylene glycol (MIRALAX) 17 g packet Take 17 g by mouth daily as needed for moderate constipation. 30 each 0  . pregabalin (LYRICA) 150 MG capsule TAKE 1 CAPSULE (150 MG TOTAL) BY MOUTH 2 (TWO) TIMES DAILY. FOR NERVE PAIN (Patient taking differently: Take 150 mg by mouth 2 (two) times daily.) 180 capsule 1  . rOPINIRole (REQUIP)  1 MG tablet TAKE 1 TABLET (1 MG TOTAL) BY MOUTH AT BEDTIME. (Patient taking differently: Take 1 mg by mouth at bedtime.) 30 tablet 2  . tamsulosin (FLOMAX) 0.4 MG CAPS capsule TAKE 1 CAPSULE (0.4 MG TOTAL) BY MOUTH DAILY. (Patient taking differently: Take 0.4 mg by mouth daily as needed.) 30 capsule 3  . thiamine 100 MG tablet TAKE 1 TABLET (100 MG TOTAL) BY MOUTH DAILY. (Patient taking differently: Take 100 mg  by mouth daily.) 30 tablet 6  . tiZANidine (ZANAFLEX) 4 MG tablet TAKE 1-2 TABS BY MOUTH EVERY NIGHT AT BEDTIME AS NEEDED (Patient taking differently: Take 4-8 mg by mouth at bedtime as needed for muscle spasms.) 40 tablet 1  . vitamin B-12 (CYANOCOBALAMIN) 1000 MCG tablet Take 1 tablet (1,000 mcg total) by mouth daily. Due to vitamin B12 deficiency (Patient taking differently: Take 1,000 mcg by mouth daily.) 30 tablet 6  . traMADol (ULTRAM) 50 MG tablet Take 50 mg by mouth every 6 (six) hours as needed for moderate pain.    Marland Kitchen triamcinolone ointment (KENALOG) 0.1 % APPLY 1 APPLICATION ON THE SKIN DAILY (Patient not taking: Reported on 03/13/2021) 80 g 0  . oxyCODONE-acetaminophen (PERCOCET) 7.5-325 MG tablet Take 1 tablet by mouth every 6 (six) hours as needed for severe pain. (Patient not taking: Reported on 03/13/2021) 40 tablet 0   No facility-administered medications prior to visit.      Review of Systems  Constitutional: Negative for fever.  HENT: Negative for postnasal drip and rhinorrhea.   Respiratory: Negative for cough, shortness of breath and wheezing.   Cardiovascular: Negative for chest pain and leg swelling.  Gastrointestinal: Positive for abdominal pain and nausea. Negative for diarrhea and vomiting.  Genitourinary: Negative.   Musculoskeletal: Positive for back pain and gait problem.  Skin:       itch       Objective:   Physical Exam Vitals:   03/13/21 0931  BP: 108/68  Pulse: 88  SpO2: 98%  Weight: 157 lb 6.4 oz (71.4 kg)  Height: 5' 8" (1.727 m)    Gen:  Pleasant, well-nourished, in no distress,  normal affect  ENT: No lesions,  mouth clear,  oropharynx clear, no postnasal drip  Neck: No JVD, no TMG, no carotid bruits  Lungs: No use of accessory muscles, no dullness to percussion, clear without rales or rhonchi  Cardiovascular: RRR, heart sounds normal, no murmur or gallops, no peripheral edema  Abdomen: Tender epigastric area without rebound or guarding, no HSM,  BS normal  Musculoskeletal: No deformities, no cyanosis or clubbing  Neuro: alert, non focal  Skin: Warm, no lesions or rashes  No results found.      Assessment & Plan:  I personally reviewed all images and lab data in the [facility] system as well as any outside material available during this office visit and agree with the  radiology impressions.   COPD with chronic bronchitis (HCC) Improved on Symbicort  Chronic viral hepatitis C ([place]) No longer on antivirals RNA quantitative is negative  Alcohol-induced chronic pancreatitis (HCC) Chronic pancreatitis follow-up with gastroenterology  Chronic midline low back pain with bilateral sciatica Chronic mid back pain follow-up with physical therapy  SMOKER    . Current smoking consumption amount: 1 pack a [place]  . Dicsussion on advise to quit smoking and smoking impacts: Impacts on lung health  . Patient's willingness to quit: Uncertain  . Methods to quit smoking discussed: Behavioral modification nicotine replacement  . Medication management of smoking session drugs discussed:  . Resources provided:  AVS   . Setting quit date not established  . Follow-up arranged 1 months   Time spent counseling the patient: 5 minutes   Chronic pain syndrome Referral to pain management clinic given was bridged with [facility] drug database was reviewed no multiple prescribers seen   Kanan was seen today for follow-up.  Diagnoses and all orders for this visit:  Alcoholic peripheral neuropathy ([place])  PAD  (peripheral artery disease) (HCC)  Alcohol-induced chronic pancreatitis ([place]) -     Ambulatory referral to Pain Clinic  Malnutrition of moderate degree (HCC)  Chronic hepatitis C without hepatic coma (HCC)  Chronic pain syndrome -     Ambulatory referral to Pain Clinic  COPD with chronic bronchitis ([place])  Chronic midline low back pain with bilateral sciatica  SMOKER  Other orders -     oxyCODONE-acetaminophen (PERCOCET) 7.5-325 MG tablet; Take 1 tablet by mouth every 6 (six) hours as needed for severe pain.

## 2021-03-13 NOTE — Assessment & Plan Note (Signed)
Chronic pancreatitis follow-up with gastroenterology

## 2021-03-13 NOTE — Assessment & Plan Note (Signed)
Marland Kitchen  Current smoking consumption amount: 1 pack a [place]  . Dicsussion on advise to quit smoking and smoking impacts: Impacts on lung health  . Patient's willingness to quit: Uncertain  . Methods to quit smoking discussed: Behavioral modification nicotine replacement  . Medication management of smoking session drugs discussed:  . Resources provided:  AVS   . Setting quit date not established  . Follow-up arranged 1 months   Time spent counseling the patient: 5 minutes

## 2021-03-18 ENCOUNTER — Other Ambulatory Visit (HOSPITAL_COMMUNITY)
Admission: RE | Admit: 2021-03-18 | Discharge: 2021-03-18 | Disposition: A | Discharge status: Home / Self Care | Payer: Medicare Other | Source: Ambulatory Visit | Attending: Gastroenterology | Admitting: Gastroenterology

## 2021-03-18 DIAGNOSIS — Z20822 Contact with and (suspected) exposure to covid-19: Secondary | ICD-10-CM | POA: Insufficient documentation

## 2021-03-18 DIAGNOSIS — Z01812 Encounter for preprocedural laboratory examination: Secondary | ICD-10-CM | POA: Insufficient documentation

## 2021-03-18 LAB — SARS CORONAVIRUS 2 (TAT 6-24 HRS): SARS Coronavirus 2: NEGATIVE

## 2021-03-19 ENCOUNTER — Telehealth: Payer: Self-pay | Admitting: Critical Care Medicine

## 2021-03-19 DIAGNOSIS — K029 Dental caries, unspecified: Secondary | ICD-10-CM

## 2021-03-19 NOTE — Telephone Encounter (Signed)
Will forward to provider to place referral

## 2021-03-19 NOTE — Telephone Encounter (Signed)
Referral sent

## 2021-03-19 NOTE — Telephone Encounter (Signed)
Copied from [place] [id number]. Topic: Referral - Request for Referral >> Mar 19, 2021 10:23 AM Yvette Rack wrote: Has patient seen PCP for this complaint? yes  *If NO, is insurance requiring patient see PCP for this issue before PCP can refer them? Referral for which specialty: Dentistry  Preferred provider/office: [facility] through orange card Reason for referral: loose tooth

## 2021-03-20 ENCOUNTER — Encounter (HOSPITAL_COMMUNITY): Payer: Self-pay | Admitting: Gastroenterology

## 2021-03-20 ENCOUNTER — Other Ambulatory Visit: Payer: Self-pay

## 2021-03-20 NOTE — Progress Notes (Signed)
Patient denies shortness of breath, fever, cough or chest pain.  PCP - Dr Asencion Noble Cardiologist - n/a Gertie Fey - Dr [name]  Chest x-ray - 01/01/21 (1V) EKG - n/a Stress Test - n/a ECHO - n/a Cardiac Cath - n/a  STOP now taking any Aspirin (unless otherwise instructed by your surgeon), Aleve, Naproxen, Ibuprofen, Motrin, Advil, Goody's, BC's, all herbal medications, fish oil, and all vitamins.   Coronavirus Screening Covid test on 03/18/21 was negative.  Patient verbalized understanding of instructions that were given via phone.

## 2021-03-20 NOTE — Anesthesia Preprocedure Evaluation (Addendum)
Anesthesia Evaluation  Patient identified by MRN, date of birth, ID band Patient awake    Reviewed: Allergy & Precautions, NPO status , Patient's Chart, lab work & pertinent test results  History of Anesthesia Complications Negative for: history of anesthetic complications  Airway Mallampati: I  TM Distance: >3 FB Neck ROM: Full    Dental no notable dental hx. (+) Dental Advisory Given, Poor Dentition, Missing, Chipped, Loose,    Pulmonary COPD, Current Smoker and Patient abstained from smoking.,    Pulmonary exam normal breath sounds clear to auscultation       Cardiovascular + Peripheral Vascular Disease  Normal cardiovascular exam Rhythm:Regular Rate:Normal     Neuro/Psych PSYCHIATRIC DISORDERS  Neuromuscular disease (neuropathy)    GI/Hepatic (+)     substance abuse (quit in march)  alcohol use, Hepatitis -, C  Endo/Other   Hyponatremia   Renal/GU      Musculoskeletal  (+) Arthritis ,   Abdominal   Peds  Hematology   Anesthesia Other Findings   Reproductive/Obstetrics                           Anesthesia Physical  Anesthesia Plan  ASA: III  Anesthesia Plan: MAC   Post-op Pain Management:    Induction: Intravenous  PONV Risk Score and Plan: Treatment may vary due to age or medical condition  Airway Management Planned: Natural Airway, Nasal Cannula, Simple Face Mask and Mask  Additional Equipment: None  Intra-op Plan:   Post-operative Plan: Extubation in OR  Informed Consent: I have reviewed the patients History and Physical, chart, labs and discussed the procedure including the risks, benefits and alternatives for the proposed anesthesia with the patient or authorized representative who has indicated his/her understanding and acceptance.     Dental advisory given  Plan Discussed with: CRNA and Anesthesiologist  Anesthesia Plan Comments:         Anesthesia  Quick Evaluation

## 2021-03-21 ENCOUNTER — Encounter (HOSPITAL_COMMUNITY): Payer: Self-pay | Admitting: Gastroenterology

## 2021-03-21 ENCOUNTER — Ambulatory Visit (HOSPITAL_COMMUNITY)
Admission: RE | Admit: 2021-03-21 | Discharge: 2021-03-21 | Disposition: A | Discharge status: Home / Self Care | Payer: Medicare Other | Attending: Gastroenterology | Admitting: Gastroenterology

## 2021-03-21 ENCOUNTER — Telehealth: Payer: Self-pay

## 2021-03-21 ENCOUNTER — Ambulatory Visit (HOSPITAL_COMMUNITY): Payer: Medicare Other | Admitting: Physician Assistant

## 2021-03-21 ENCOUNTER — Encounter (HOSPITAL_COMMUNITY)
Admission: RE | Disposition: A | Discharge status: Home / Self Care | Payer: Self-pay | Source: Home / Self Care | Attending: Gastroenterology

## 2021-03-21 ENCOUNTER — Other Ambulatory Visit: Payer: Self-pay

## 2021-03-21 DIAGNOSIS — K859 Acute pancreatitis without necrosis or infection, unspecified: Secondary | ICD-10-CM

## 2021-03-21 DIAGNOSIS — K3189 Other diseases of stomach and duodenum: Secondary | ICD-10-CM | POA: Insufficient documentation

## 2021-03-21 DIAGNOSIS — K2980 Duodenitis without bleeding: Secondary | ICD-10-CM | POA: Insufficient documentation

## 2021-03-21 DIAGNOSIS — F17210 Nicotine dependence, cigarettes, uncomplicated: Secondary | ICD-10-CM | POA: Insufficient documentation

## 2021-03-21 DIAGNOSIS — K861 Other chronic pancreatitis: Secondary | ICD-10-CM | POA: Diagnosis not present

## 2021-03-21 DIAGNOSIS — K869 Disease of pancreas, unspecified: Secondary | ICD-10-CM | POA: Diagnosis not present

## 2021-03-21 DIAGNOSIS — K831 Obstruction of bile duct: Secondary | ICD-10-CM

## 2021-03-21 DIAGNOSIS — K208 Other esophagitis without bleeding: Secondary | ICD-10-CM | POA: Insufficient documentation

## 2021-03-21 DIAGNOSIS — R590 Localized enlarged lymph nodes: Secondary | ICD-10-CM | POA: Diagnosis not present

## 2021-03-21 DIAGNOSIS — B49 Unspecified mycosis: Secondary | ICD-10-CM | POA: Diagnosis not present

## 2021-03-21 DIAGNOSIS — K838 Other specified diseases of biliary tract: Secondary | ICD-10-CM | POA: Diagnosis not present

## 2021-03-21 HISTORY — PX: EUS: SHX5427

## 2021-03-21 HISTORY — PX: BIOPSY: SHX5522

## 2021-03-21 HISTORY — PX: FINE NEEDLE ASPIRATION BIOPSY: CATH118315

## 2021-03-21 HISTORY — PX: ESOPHAGOGASTRODUODENOSCOPY (EGD) WITH PROPOFOL: SHX5813

## 2021-03-21 SURGERY — ESOPHAGOGASTRODUODENOSCOPY (EGD) WITH PROPOFOL
Anesthesia: Monitor Anesthesia Care

## 2021-03-21 MED ORDER — LIDOCAINE HCL URETHRAL/MUCOSAL 2 % EX GEL
CUTANEOUS | Status: DC | PRN
  Administered 2021-03-21: 1 via TOPICAL

## 2021-03-21 MED ORDER — PROPOFOL 500 MG/50ML IV EMUL
INTRAVENOUS | Status: DC | PRN
  Administered 2021-03-21: 75 ug/kg/min via INTRAVENOUS

## 2021-03-21 MED ORDER — LACTATED RINGERS IV SOLN: INTRAVENOUS | Status: DC

## 2021-03-21 MED ORDER — ONDANSETRON HCL 4 MG/2ML IJ SOLN
INTRAMUSCULAR | Status: DC | PRN
  Administered 2021-03-21: 4 mg via INTRAVENOUS

## 2021-03-21 MED ORDER — KETAMINE HCL 10 MG/ML IJ SOLN
INTRAMUSCULAR | Status: DC | PRN
  Administered 2021-03-21 (×7): 5 mg via INTRAVENOUS

## 2021-03-21 MED ORDER — FENTANYL CITRATE (PF) 100 MCG/2ML IJ SOLN
INTRAMUSCULAR | Status: DC | PRN
  Administered 2021-03-21: 50 ug via INTRAVENOUS
  Administered 2021-03-21 (×2): 25 ug via INTRAVENOUS

## 2021-03-21 MED ORDER — DEXMEDETOMIDINE (PRECEDEX) IN NS 20 MCG/5ML (4 MCG/ML) IV SYRINGE
PREFILLED_SYRINGE | INTRAVENOUS | Status: DC | PRN
  Administered 2021-03-21 (×4): 4 ug via INTRAVENOUS

## 2021-03-21 MED ORDER — LIDOCAINE VISCOUS HCL 2 % MT SOLN
OROMUCOSAL | Status: AC
  Filled 2021-03-21: qty 15

## 2021-03-21 MED ORDER — PROPOFOL 10 MG/ML IV BOLUS
INTRAVENOUS | Status: DC | PRN
  Administered 2021-03-21 (×3): 20 mg via INTRAVENOUS
  Administered 2021-03-21: 10 mg via INTRAVENOUS

## 2021-03-21 MED ORDER — GLYCOPYRROLATE 0.2 MG/ML IJ SOLN
INTRAMUSCULAR | Status: DC | PRN
  Administered 2021-03-21 (×2): .1 mg via INTRAVENOUS

## 2021-03-21 MED ORDER — FENTANYL CITRATE (PF) 100 MCG/2ML IJ SOLN
25.0000 ug | INTRAMUSCULAR | Status: DC | PRN
  Administered 2021-03-21: 50 ug via INTRAVENOUS

## 2021-03-21 SURGICAL SUPPLY — 14 items

## 2021-03-21 NOTE — Op Note (Signed)
[facility] Patient Name: Wesley [name] Procedure Date : 03/21/2021 MRN: [id number] Attending MD: Justice Britain , MD Date of Birth: [birth year] CSN: [id number] Age: 55 Admit Type: Outpatient Procedure:                Upper EUS Indications:              CBD stricture on MRCP, Suspected mass in pancreas                            on MRCP, Exclusion of chronic pancreatitis, Acute                            recurrent pancreatitis Providers:                Justice Britain, MD, Baird Cancer, RN, Elspeth Cho Tech., Technician, Adolphus Birchwood Referring MD:             Carlota Raspberry. Havery Moros, MD, Asencion Noble, MD Medicines:                Monitored Anesthesia Care Complications:            No immediate complications. Estimated Blood Loss:     Estimated blood loss was minimal. Procedure:                Pre-Anesthesia Assessment:                           - Prior to the procedure, a History and Physical                            was performed, and patient medications and                            allergies were reviewed. The patient's tolerance of                            previous anesthesia was also reviewed. The risks                            and benefits of the procedure and the sedation                            options and risks were discussed with the patient.                            All questions were answered, and informed consent                            was obtained. Prior Anticoagulants: The patient has                            taken no previous anticoagulant or antiplatelet  agents. ASA Grade Assessment: III - A patient with                            severe systemic disease. After reviewing the risks                            and benefits, the patient was deemed in                            satisfactory condition to undergo the procedure.                           After obtaining  informed consent, the endoscope was                            passed under direct vision. Throughout the                            procedure, the patient's blood pressure, pulse, and                            oxygen saturations were monitored continuously. The                            GIF-H190 ([id number]) Olympus gastroscope was                            introduced through the mouth, and advanced to the                            second part of duodenum. The TJF- Q180V ([id number])                            Olympus duodenoscope was introduced through the                            mouth, and advanced to the second part of duodenum.                            The GF-UCT180 ([id number]) Olympus Linear EUS was                            introduced through the mouth, and advanced to the                            duodenum for ultrasound examination from the                            stomach and duodenum. The upper EUS was                            accomplished without difficulty. The patient  tolerated the procedure. Scope In: Scope Out: Findings:      ENDOSCOPIC FINDING: :      White nummular lesions were noted in the entire esophagus. Biopsies were       taken with a cold forceps for histology to rule out Candida.      The Z-line was regular and was found 45 cm from the incisors.      Patchy mildly erythematous mucosa without bleeding was found in the       gastric body and in the gastric antrum.      No other gross lesions were noted in the entire examined stomach -       previously biopsied and negative for HP.      Normal mucosa was found in the duodenal bulb, in the first portion of       the duodenum and in the second portion of the duodenum.      The major papilla was normal.      Localized mild inflammation characterized by erythema and friability was       found in the periampullary area. Biopsies were taken with a cold forceps       for histology.       ENDOSONOGRAPHIC FINDING: :      An irregular region/lesion was identified in the region of the distal       CBD narrowing/stricture. The lesion was partially hypoechoic but more so       homogenous. The lesion measured 33 mm by 28 mm in maximal       cross-sectional diameter. The outer margins were irregular. An intact       interface was seen between the mass and the superior mesenteric artery       and celiac trunk suggesting a lack of invasion. Fine needle biopsy was       performed. Color Doppler imaging was utilized prior to needle puncture       to confirm a lack of significant vascular structures within the needle       path. Seven passes were made (initially I used a 19 gauge Acquire       ultrasound biopsy needle using a transduodenal approach but this was too       stiff to allow for adequate use in this region so I changed to a 25       guage Acquire - did not have a 22 g Acquire to complete the rest of the       passes). Visible cores of tissue were obtained. Touch preps were       performed. Final cytology results are pending.      Pancreatic parenchymal abnormalities were noted in the entire pancreas.       These consisted of atrophy, hyperechoic foci with shadowing, lobularity       without honeycombing and hyperechoic strands.      The pancreatic duct had a tortuous/ectatic appearance and had       hyperechoic walls in the body of the pancreas and tail of the pancreas.       The pancreatic duct in the head measured 1.2 mm, the pancreatic duc tin       the neck measured 2.6 mm, the pancreatic duct in the body measured 2.4       -> 1.5 mm, the pancreatic duct in the tail measured 1.0 mm.      There was dilation in the common hepatic duct (10.7 mm), with a  normal       CBD appearance of 5.2 mm, though there was noted narrowing vs stricture       in the distal CBD (in region of the noted lesion above).      Endosonographic imaging in the common bile duct and in the cystic duct        (though enlarged partially) showed no stones or sludge.      Endosonographic imaging of the ampulla showed no extrinsic compression,       intramural (subepithelial) lesion, mass, varices or wall thickening.      Endosonographic imaging in the visualized portion of the liver showed no       mass.      One enlarged lymph node was visualized in the peripancreatic region. It       measured 4 mm by 5 mm in maximal cross-sectional diameter. The node was       triangular, isoechoic and had well defined margins.      The celiac region was visualized. Impression:               EGD Impression:                           - White nummular lesions in esophageal mucosa.                            Biopsied.                           - Z-line regular, 45 cm from the incisors.                           - Erythematous mucosa in the gastric body and                            antrum. No other gross lesions in the stomach.                            Previously biopsied and negative for HP.                           - Normal mucosa was found in the duodenal bulb, in                            the first portion of the duodenum and in the second                            portion of the duodenum.                           - Normal major papilla though mild duodenitis noted                            in periampullary region (this was biopsied).                           EUS Impression:                           -  A mass-like region was identified in the                            pancreatic head where CBD narrowing vs stricture                            was found. Cytology results are pending. However,                            the endosonographic appearance is suggestive of                            either chronic pancreatitis (previous significant                            alcohol consumption) changes vs possible                            adenocarcinoma due to the narrowing noted (though                             benign narrowing can occur as well in setting of                            chronic pancreatitis). Fine needle biopsy                            performed. If malignancy is found this will be                            staged T2 N0 Mx by endosonographic criteria. Fine                            needle biopsy performed.                           - Pancreatic parenchymal abnormalities consisting                            of atrophy, hyperechoic foci, lobularity and                            hyperechoic strands were noted in the entire                            pancreas.                           - The pancreatic duct had a tortuous/ectatic                            appearance and had hyperechoic walls in the body of                            the pancreas and  tail of the pancreas.                           - There was dilation in the common hepatic duct.                            CBD narrowing/stricture noted in distal CBD                           - One enlarged lymph node was visualized in the                            peripancreatic region. Tissue has not been                            obtained. However, the endosonographic appearance                            is suggestive of benign inflammatory changes based                            on EUS.                           - Patient has 1 Major A criteria and 3 Minor                            criteria via [place] classification this is                            consistent with a diagnosis of Chronic Pancreatitis. Recommendation:           - The patient will be observed post-procedure,                            until all discharge criteria are met.                           - Discharge patient to home.                           - Patient has a contact number available for                            emergencies. The signs and symptoms of potential                            delayed complications were discussed with  the                            patient. Return to normal activities tomorrow.                            Written discharge instructions were provided to the  patient.                           - Low fat diet for 1 week.                           - Await cytology results and await path results.                           - If cytology is non-malignant, consideration of                            repeat EUS in 4-6 weeks with CEA level and repeat                            HFP to be drawn after further time from recent                            pancreatitis.                           - Also recommend initiation of PERT (72K with each                            meal and 36K with each snack), in setting of                            chronic pancreatitis diagnosis.                           - The findings and recommendations were discussed                            with the patient.                           - The findings and recommendations were discussed                            with the patient's family. Procedure Code(s):        --- Professional ---                           [id number], Esophagogastroduodenoscopy, flexible,                            transoral; with transendoscopic ultrasound-guided                            intramural or transmural fine needle                            aspiration/biopsy(s), (includes endoscopic                            ultrasound examination limited to the esophagus,  stomach or duodenum, and adjacent structures) Diagnosis Code(s):        --- Professional ---                           K22.8, Other specified diseases of esophagus                           K31.89, Other diseases of stomach and duodenum                           K29.80, Duodenitis without bleeding                           K86.9, Disease of pancreas, unspecified                           R93.3, Abnormal findings on diagnostic imaging of                             other parts of digestive tract                           K86.89, Other specified diseases of pancreas                           R59.0, Localized enlarged lymph nodes                           K83.1, Obstruction of bile duct                           K85.90, Acute pancreatitis without necrosis or                            infection, unspecified                           K83.8, Other specified diseases of biliary tract                           R93.2, Abnormal findings on diagnostic imaging of                            liver and biliary tract CPT copyright 2019 American Medical Association. All rights reserved. The codes documented in this report are preliminary and upon coder review may  be revised to meet current compliance requirements. Justice Britain, MD 03/21/2021 9:26:45 AM Number of Addenda: 0

## 2021-03-21 NOTE — Transfer of Care (Signed)
Immediate Anesthesia Transfer of Care Note  Patient: Stylianos N Insalaco  Procedure(s) Performed: ESOPHAGOGASTRODUODENOSCOPY (EGD) WITH PROPOFOL (N/A ) UPPER ENDOSCOPIC ULTRASOUND (EUS) RADIAL (N/A ) BIOPSY FINE NEEDLE ASPIRATION BIOPSY  Patient Location: PACU  Anesthesia Type:General  Level of Consciousness: drowsy and patient cooperative  Airway & Oxygen Therapy: Patient Spontanous Breathing and Patient connected to face mask oxygen  Post-op Assessment: Report given to RN and Post -op Vital signs reviewed and stable  Post vital signs: Reviewed and stable  Last Vitals:  Vitals Value Taken Time  BP 136/97 03/21/21 0906  Temp    Pulse 69 03/21/21 0909  Resp 15 03/21/21 0909  SpO2 97 % 03/21/21 0909  Vitals shown include unvalidated device data.  Last Pain:  Vitals:   03/21/21 0705  TempSrc: Oral  PainSc: 7          Complications: No complications documented.
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
## 2021-03-21 NOTE — Anesthesia Postprocedure Evaluation (Signed)
Anesthesia Post Note  Patient: Wesley [name]  Procedure(s) Performed: ESOPHAGOGASTRODUODENOSCOPY (EGD) WITH PROPOFOL (N/A ) UPPER ENDOSCOPIC ULTRASOUND (EUS) RADIAL (N/A ) BIOPSY FINE NEEDLE ASPIRATION BIOPSY     Patient location during evaluation: PACU Anesthesia Type: MAC Level of consciousness: awake and alert Pain management: pain level controlled Vital Signs Assessment: post-procedure vital signs reviewed and stable Respiratory status: spontaneous breathing, nonlabored ventilation, respiratory function stable and patient connected to nasal cannula oxygen Cardiovascular status: stable and blood pressure returned to baseline Postop Assessment: no apparent nausea or vomiting Anesthetic complications: no   No complications documented.  Last Vitals:  Vitals:   03/21/21 0906 03/21/21 0920  BP: (!) 136/97 (!) 129/91  Pulse: 72 67  Resp: 15 13  Temp: 36.5 C 36.5 C  SpO2: 100% 100%    Last Pain:  Vitals:   03/21/21 0920  TempSrc:   PainSc: 10-Worst pain ever                 Simar Pothier

## 2021-03-21 NOTE — Telephone Encounter (Signed)
-----  Message from Lavena Bullion, DO sent at 03/21/2021  4:25 PM EDT ----- Thanks Chester Holstein, appreciate your help with his care.   Sunshine Mackowski Can we call to check in on this patient tomorrow and also get him started on [name] and navigator assistance as outlined below? Thanks!  VC  ----- Message ----- From: Irving Copas., MD Sent: 03/21/2021  12:20 PM EDT To: Yetta Flock, MD, Lavena Bullion, DO, #  Sorry about that SA.  VC, please see thoughts below. Will see how he does, though chance with the amount of pain he was having pre-procedure he may end up being back in the hospital soon. I'll update you all with results of pathology. GM ----- Message ----- From: Yetta Flock, MD Sent: 03/21/2021  12:08 PM EDT To: Irving Copas., MD, #  Thanks Chester Holstein.  I think I saw this patient for a inpatient follow up note last month, but it looks like he belongs to [facility] who has seen him outpatient.  [place], [place], let me know otherwise if this is not your patient.   Thanks. Richardson Landry ----- Message ----- From: Irving Copas., MD Sent: 03/21/2021   9:44 AM EDT To: Yetta Flock, MD, Yevette Edwards, RN  SA, The patient has evidence of a masslike area in the head of pancreas which I biopsied.  He also has evidence of EUS definition chronic pancreatitis.  I recommend we get him initiated on pancreas enzyme replacement therapy in an effort of trying to help some of his pain.  72,000 units with each meal and 36,000 units with each snack.  May be ideal to see about getting him patient navigator assistance and some samples to see how he does.   Also if you have an opportunity to call him tomorrow because he was already in the significant amount of pain but did not want to stay in the hospital to ensure that he does not need to come into the hospital post procedure. I will be in touch with him once the results of the biopsies return. Thanks. GM

## 2021-03-22 ENCOUNTER — Encounter: Payer: Self-pay | Admitting: Gastroenterology

## 2021-03-22 ENCOUNTER — Other Ambulatory Visit: Payer: Self-pay

## 2021-03-22 ENCOUNTER — Encounter (HOSPITAL_COMMUNITY): Payer: Self-pay | Admitting: Gastroenterology

## 2021-03-22 DIAGNOSIS — K859 Acute pancreatitis without necrosis or infection, unspecified: Secondary | ICD-10-CM

## 2021-03-22 LAB — CYTOLOGY - NON PAP

## 2021-03-22 LAB — SURGICAL PATHOLOGY

## 2021-03-22 MED ORDER — PANCRELIPASE (LIP-PROT-AMYL) 36000-114000 UNITS PO CPEP
ORAL_CAPSULE | ORAL | 1 refills | Status: DC
  Filled 2021-03-22: qty 280, 32d supply, fill #0

## 2021-03-22 MED ORDER — PANCRELIPASE (LIP-PROT-AMYL) 36000-114000 UNITS PO CPEP: ORAL_CAPSULE | ORAL | 1 refills | Status: DC

## 2021-03-22 NOTE — Telephone Encounter (Addendum)
Dr. [name], just an FYI. Thanks  Spoke with patient in regards to recommendations and to follow up and see how he is feeling.  He states that he didn't sleep all night because he was in a lot of pain. He states that he feels much better today but still having some pain. He states that he took Naproxen for his back and that provided some relief but he was unsure if the pain from his back was causing the pain in his stomach. He is aware that we would like him to begin [name] and I will send this in to [facility] since the other pharmacy does not have it. Advised that I will also leave him some samples at the front desk to pick up so that he can begin taking it until we can get him the prescription. Patient is aware that I have faxed over a form for the [name] Nurse ambassador to reach out to him in regards to finding assistance for the medication cost, he is aware that they will reach out to him within 24 hours but it may be Monday since it is the weekend. Advised patient that if he does not hear from them to call and I have provided him with the contact number. Patient verbalized understanding of all information and had no concerns at the end of the call.

## 2021-03-22 NOTE — Telephone Encounter (Signed)
Wesley [name] from [facility] called in. They are unable to fill [name] prescription because too expensive and unable to do financial assistance because it has been discontinued by the manufacture

## 2021-03-22 NOTE — Addendum Note (Signed)
Addended by: Yevette Edwards on: 03/22/2021 01:22 PM   Modules accepted: Orders

## 2021-03-22 NOTE — Telephone Encounter (Signed)
Lm on mobile vm for patient to return call.   Prescription for [name] has been sent to pharmacy on file.   [name] nurse ambassador form has been completed and faxed to [phone].

## 2021-03-26 ENCOUNTER — Other Ambulatory Visit: Payer: Self-pay

## 2021-03-26 DIAGNOSIS — K859 Acute pancreatitis without necrosis or infection, unspecified: Secondary | ICD-10-CM

## 2021-03-26 DIAGNOSIS — R948 Abnormal results of function studies of other organs and systems: Secondary | ICD-10-CM

## 2021-03-28 ENCOUNTER — Other Ambulatory Visit (INDEPENDENT_AMBULATORY_CARE_PROVIDER_SITE_OTHER): Payer: Medicare Other

## 2021-03-28 DIAGNOSIS — Z79899 Other long term (current) drug therapy: Secondary | ICD-10-CM | POA: Diagnosis not present

## 2021-03-28 DIAGNOSIS — R935 Abnormal findings on diagnostic imaging of other abdominal regions, including retroperitoneum: Secondary | ICD-10-CM | POA: Diagnosis not present

## 2021-03-28 DIAGNOSIS — M542 Cervicalgia: Secondary | ICD-10-CM | POA: Diagnosis not present

## 2021-03-28 DIAGNOSIS — K859 Acute pancreatitis without necrosis or infection, unspecified: Secondary | ICD-10-CM | POA: Diagnosis not present

## 2021-03-28 DIAGNOSIS — F17210 Nicotine dependence, cigarettes, uncomplicated: Secondary | ICD-10-CM | POA: Diagnosis not present

## 2021-03-28 DIAGNOSIS — Z79891 Long term (current) use of opiate analgesic: Secondary | ICD-10-CM | POA: Diagnosis not present

## 2021-03-28 DIAGNOSIS — M545 Low back pain, unspecified: Secondary | ICD-10-CM | POA: Diagnosis not present

## 2021-03-28 LAB — HEPATIC FUNCTION PANEL
ALT: 151 U/L — ABNORMAL HIGH (ref 0–53)
AST: 67 U/L — ABNORMAL HIGH (ref 0–37)
Albumin: 4.4 g/dL (ref 3.5–5.2)
Alkaline Phosphatase: 300 U/L — ABNORMAL HIGH (ref 39–117)
Bilirubin, Direct: 0.4 mg/dL — ABNORMAL HIGH (ref 0.0–0.3)
Total Bilirubin: 1.6 mg/dL — ABNORMAL HIGH (ref 0.2–1.2)
Total Protein: 8.3 g/dL (ref 6.0–8.3)

## 2021-03-28 LAB — BILIRUBIN, DIRECT: Bilirubin, Direct: 0.4 mg/dL — ABNORMAL HIGH (ref 0.0–0.3)

## 2021-03-29 LAB — CANCER ANTIGEN 19-9: CA 19-9: 38 U/mL — ABNORMAL HIGH (ref <34)

## 2021-04-02 ENCOUNTER — Encounter: Payer: Self-pay | Admitting: Physical Therapy

## 2021-04-02 ENCOUNTER — Other Ambulatory Visit: Payer: Self-pay

## 2021-04-02 ENCOUNTER — Ambulatory Visit: Payer: Medicare Other | Attending: Critical Care Medicine | Admitting: Physical Therapy

## 2021-04-02 DIAGNOSIS — M546 Pain in thoracic spine: Secondary | ICD-10-CM | POA: Diagnosis not present

## 2021-04-02 DIAGNOSIS — M5441 Lumbago with sciatica, right side: Secondary | ICD-10-CM | POA: Insufficient documentation

## 2021-04-02 DIAGNOSIS — R262 Difficulty in walking, not elsewhere classified: Secondary | ICD-10-CM | POA: Diagnosis not present

## 2021-04-02 DIAGNOSIS — R252 Cramp and spasm: Secondary | ICD-10-CM | POA: Diagnosis not present

## 2021-04-02 DIAGNOSIS — G8929 Other chronic pain: Secondary | ICD-10-CM | POA: Diagnosis not present

## 2021-04-02 DIAGNOSIS — M5442 Lumbago with sciatica, left side: Secondary | ICD-10-CM | POA: Insufficient documentation

## 2021-04-02 DIAGNOSIS — M6281 Muscle weakness (generalized): Secondary | ICD-10-CM | POA: Diagnosis not present
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
## 2021-04-04 ENCOUNTER — Encounter: Payer: No Typology Code available for payment source | Admitting: Physical Therapy

## 2021-04-05 ENCOUNTER — Other Ambulatory Visit: Payer: Self-pay

## 2021-04-05 ENCOUNTER — Other Ambulatory Visit (INDEPENDENT_AMBULATORY_CARE_PROVIDER_SITE_OTHER): Payer: Medicare Other

## 2021-04-05 DIAGNOSIS — K859 Acute pancreatitis without necrosis or infection, unspecified: Secondary | ICD-10-CM

## 2021-04-05 LAB — COMPREHENSIVE METABOLIC PANEL
ALT: 151 U/L — ABNORMAL HIGH (ref 0–53)
AST: 53 U/L — ABNORMAL HIGH (ref 0–37)
Albumin: 4.4 g/dL (ref 3.5–5.2)
Alkaline Phosphatase: 317 U/L — ABNORMAL HIGH (ref 39–117)
BUN: 8 mg/dL (ref 6–23)
CO2: 25 mEq/L (ref 19–32)
Calcium: 9.7 mg/dL (ref 8.4–10.5)
Chloride: 101 mEq/L (ref 96–112)
Creatinine, Ser: 1.01 mg/dL (ref 0.40–1.50)
GFR: 83.36 mL/min (ref >60.00)
Glucose, Bld: 88 mg/dL (ref 70–99)
Potassium: 4.2 mEq/L (ref 3.5–5.1)
Sodium: 135 mEq/L (ref 135–145)
Total Bilirubin: 1.6 mg/dL — ABNORMAL HIGH (ref 0.2–1.2)
Total Protein: 8.2 g/dL (ref 6.0–8.3)

## 2021-04-09 ENCOUNTER — Ambulatory Visit: Payer: Medicare Other | Admitting: Physical Therapy

## 2021-04-11 ENCOUNTER — Ambulatory Visit: Payer: Medicare Other | Admitting: Physical Therapy

## 2021-04-16 ENCOUNTER — Encounter: Payer: No Typology Code available for payment source | Admitting: Physical Therapy

## 2021-04-18 ENCOUNTER — Encounter: Payer: No Typology Code available for payment source | Admitting: Physical Therapy

## 2021-04-23 ENCOUNTER — Encounter: Payer: No Typology Code available for payment source | Admitting: Physical Therapy

## 2021-04-25 ENCOUNTER — Encounter: Payer: No Typology Code available for payment source | Admitting: Physical Therapy

## 2021-04-30 ENCOUNTER — Encounter: Payer: No Typology Code available for payment source | Admitting: Physical Therapy

## 2021-05-01 ENCOUNTER — Other Ambulatory Visit: Payer: Self-pay

## 2021-05-01 MED FILL — Pregabalin Cap 150 MG: ORAL | 30 days supply | Qty: 60 | Fill #1 | Status: CN

## 2021-05-02 ENCOUNTER — Other Ambulatory Visit: Payer: Self-pay

## 2021-05-02 ENCOUNTER — Ambulatory Visit: Payer: Medicare Other | Admitting: Physical Therapy

## 2021-05-02 MED FILL — Ondansetron HCl Tab 4 MG: ORAL | 7 days supply | Qty: 20 | Fill #0 | Status: CN

## 2021-05-03 ENCOUNTER — Other Ambulatory Visit: Payer: Self-pay

## 2021-05-07 ENCOUNTER — Encounter: Payer: No Typology Code available for payment source | Admitting: Physical Therapy

## 2021-05-14 ENCOUNTER — Encounter: Payer: No Typology Code available for payment source | Admitting: Physical Therapy

## 2021-05-16 ENCOUNTER — Encounter (HOSPITAL_COMMUNITY): Payer: Self-pay | Admitting: Gastroenterology

## 2021-05-16 ENCOUNTER — Encounter: Payer: No Typology Code available for payment source | Admitting: Physical Therapy

## 2021-05-16 ENCOUNTER — Other Ambulatory Visit: Payer: Self-pay

## 2021-05-16 NOTE — Progress Notes (Signed)
Attempted to obtain medical history via telephone, unable to reach at this time. I left a voicemail to return pre surgical testing department's phone call.

## 2021-05-17 ENCOUNTER — Other Ambulatory Visit (HOSPITAL_COMMUNITY)
Admission: RE | Admit: 2021-05-17 | Discharge: 2021-05-17 | Disposition: A | Discharge status: Home / Self Care | Payer: Medicare Other | Source: Ambulatory Visit | Attending: Gastroenterology | Admitting: Gastroenterology

## 2021-05-17 DIAGNOSIS — Z20822 Contact with and (suspected) exposure to covid-19: Secondary | ICD-10-CM | POA: Insufficient documentation

## 2021-05-17 DIAGNOSIS — Z01812 Encounter for preprocedural laboratory examination: Secondary | ICD-10-CM | POA: Diagnosis not present

## 2021-05-17 LAB — SARS CORONAVIRUS 2 (TAT 6-24 HRS): SARS Coronavirus 2: NEGATIVE

## 2021-05-22 ENCOUNTER — Ambulatory Visit (HOSPITAL_COMMUNITY): Payer: Medicare Other | Admitting: Anesthesiology

## 2021-05-22 ENCOUNTER — Ambulatory Visit (HOSPITAL_COMMUNITY)
Admission: RE | Admit: 2021-05-22 | Discharge: 2021-05-22 | Disposition: A | Discharge status: Home / Self Care | Payer: Medicare Other | Attending: Gastroenterology | Admitting: Gastroenterology

## 2021-05-22 ENCOUNTER — Encounter (HOSPITAL_COMMUNITY)
Admission: RE | Disposition: A | Discharge status: Home / Self Care | Payer: Self-pay | Source: Home / Self Care | Attending: Gastroenterology

## 2021-05-22 ENCOUNTER — Encounter (HOSPITAL_COMMUNITY): Payer: Self-pay | Admitting: Gastroenterology

## 2021-05-22 ENCOUNTER — Ambulatory Visit (HOSPITAL_COMMUNITY): Payer: Medicare Other

## 2021-05-22 DIAGNOSIS — R8569 Abnormal cytological findings in specimens from other digestive organs and abdominal cavity: Secondary | ICD-10-CM | POA: Diagnosis not present

## 2021-05-22 DIAGNOSIS — B3781 Candidal esophagitis: Secondary | ICD-10-CM | POA: Diagnosis not present

## 2021-05-22 DIAGNOSIS — R109 Unspecified abdominal pain: Secondary | ICD-10-CM | POA: Diagnosis not present

## 2021-05-22 DIAGNOSIS — K838 Other specified diseases of biliary tract: Secondary | ICD-10-CM | POA: Diagnosis not present

## 2021-05-22 DIAGNOSIS — K3189 Other diseases of stomach and duodenum: Secondary | ICD-10-CM | POA: Diagnosis not present

## 2021-05-22 DIAGNOSIS — K2970 Gastritis, unspecified, without bleeding: Secondary | ICD-10-CM | POA: Diagnosis not present

## 2021-05-22 DIAGNOSIS — F17200 Nicotine dependence, unspecified, uncomplicated: Secondary | ICD-10-CM | POA: Insufficient documentation

## 2021-05-22 DIAGNOSIS — K8689 Other specified diseases of pancreas: Secondary | ICD-10-CM | POA: Diagnosis not present

## 2021-05-22 DIAGNOSIS — R978 Other abnormal tumor markers: Secondary | ICD-10-CM | POA: Insufficient documentation

## 2021-05-22 DIAGNOSIS — R110 Nausea: Secondary | ICD-10-CM

## 2021-05-22 DIAGNOSIS — K2960 Other gastritis without bleeding: Secondary | ICD-10-CM | POA: Insufficient documentation

## 2021-05-22 DIAGNOSIS — J449 Chronic obstructive pulmonary disease, unspecified: Secondary | ICD-10-CM | POA: Diagnosis not present

## 2021-05-22 DIAGNOSIS — K861 Other chronic pancreatitis: Secondary | ICD-10-CM | POA: Diagnosis not present

## 2021-05-22 DIAGNOSIS — K831 Obstruction of bile duct: Secondary | ICD-10-CM | POA: Diagnosis not present

## 2021-05-22 DIAGNOSIS — K859 Acute pancreatitis without necrosis or infection, unspecified: Secondary | ICD-10-CM | POA: Diagnosis not present

## 2021-05-22 DIAGNOSIS — R1084 Generalized abdominal pain: Secondary | ICD-10-CM | POA: Insufficient documentation

## 2021-05-22 DIAGNOSIS — K2289 Other specified disease of esophagus: Secondary | ICD-10-CM | POA: Diagnosis not present

## 2021-05-22 DIAGNOSIS — R857 Abnormal histological findings in specimens from digestive organs and abdominal cavity: Secondary | ICD-10-CM | POA: Diagnosis not present

## 2021-05-22 DIAGNOSIS — R948 Abnormal results of function studies of other organs and systems: Secondary | ICD-10-CM

## 2021-05-22 HISTORY — PX: EUS: SHX5427

## 2021-05-22 HISTORY — PX: BIOPSY: SHX5522

## 2021-05-22 HISTORY — PX: FINE NEEDLE ASPIRATION: SHX5430

## 2021-05-22 HISTORY — PX: ESOPHAGOGASTRODUODENOSCOPY (EGD) WITH PROPOFOL: SHX5813

## 2021-05-22 SURGERY — UPPER ENDOSCOPIC ULTRASOUND (EUS) RADIAL
Anesthesia: Monitor Anesthesia Care

## 2021-05-22 MED ORDER — FENTANYL CITRATE (PF) 100 MCG/2ML IJ SOLN
25.0000 ug | Freq: Once | INTRAMUSCULAR | Status: AC
Start: 2021-05-22 — End: 2021-05-22
  Administered 2021-05-22: 25 ug via INTRAVENOUS

## 2021-05-22 MED ORDER — PROPOFOL 500 MG/50ML IV EMUL
INTRAVENOUS | Status: AC
  Filled 2021-05-22: qty 50

## 2021-05-22 MED ORDER — LACTATED RINGERS IV SOLN: INTRAVENOUS | Status: DC | PRN

## 2021-05-22 MED ORDER — GLYCOPYRROLATE PF 0.2 MG/ML IJ SOSY
PREFILLED_SYRINGE | INTRAMUSCULAR | Status: DC | PRN
  Administered 2021-05-22: .2 mg via INTRAVENOUS

## 2021-05-22 MED ORDER — LIDOCAINE 2% (20 MG/ML) 5 ML SYRINGE
INTRAMUSCULAR | Status: DC | PRN
  Administered 2021-05-22: 100 mg via INTRAVENOUS

## 2021-05-22 MED ORDER — LACTATED RINGERS IV SOLN: INTRAVENOUS | Status: DC

## 2021-05-22 MED ORDER — FENTANYL CITRATE (PF) 100 MCG/2ML IJ SOLN
INTRAMUSCULAR | Status: AC
  Filled 2021-05-22: qty 2

## 2021-05-22 MED ORDER — OXYCODONE-ACETAMINOPHEN 7.5-325 MG PO TABS: 1.0000 | ORAL_TABLET | Freq: Three times a day (TID) | ORAL | 0 refills | Status: AC | PRN

## 2021-05-22 MED ORDER — PANTOPRAZOLE SODIUM 40 MG PO TBEC: 40.0000 mg | DELAYED_RELEASE_TABLET | Freq: Two times a day (BID) | ORAL | 6 refills | Status: DC

## 2021-05-22 MED ORDER — FOLIC ACID 1 MG PO TABS: 1.0000 mg | ORAL_TABLET | Freq: Every day | ORAL | 0 refills | Status: DC

## 2021-05-22 MED ORDER — PROPOFOL 10 MG/ML IV BOLUS
INTRAVENOUS | Status: DC | PRN
  Administered 2021-05-22: 30 mg via INTRAVENOUS
  Administered 2021-05-22: 20 mg via INTRAVENOUS
  Administered 2021-05-22: 30 mg via INTRAVENOUS
  Administered 2021-05-22: 20 mg via INTRAVENOUS

## 2021-05-22 MED ORDER — OXYCODONE-ACETAMINOPHEN 7.5-325 MG PO TABS
1.0000 | ORAL_TABLET | Freq: Once | ORAL | Status: AC
  Administered 2021-05-22: 1 via ORAL

## 2021-05-22 MED ORDER — PROPOFOL 500 MG/50ML IV EMUL
INTRAVENOUS | Status: DC | PRN
  Administered 2021-05-22: 160 ug/kg/min via INTRAVENOUS

## 2021-05-22 MED ORDER — SODIUM CHLORIDE 0.9 % IV SOLN: INTRAVENOUS | Status: DC

## 2021-05-22 SURGICAL SUPPLY — 14 items

## 2021-05-22 NOTE — Op Note (Signed)
[facility] Patient Name: Wesley [name] Procedure Date: 05/22/2021 MRN: [id number] Attending MD: Justice Britain , MD Date of Birth: 06-01-1966 CSN: [id number] Age: 55 Admit Type: Outpatient Procedure:                Upper EUS Indications:              Suspected mass in pancreas on MRI, Obstruction of                            bile duct on MRI, Elevated CA 19-9, Chronic                            recurrent pancreatitis, Generalized abdominal pain Providers:                Justice Britain, MD, Josie Dixon, RN, Ladona Ridgel, Technician, Caryl Pina CRNA Referring MD:             Carlota Raspberry. Havery Moros, MD, Asencion Noble, MD Medicines:                Monitored Anesthesia Care Complications:            No immediate complications. Estimated Blood Loss:     Estimated blood loss was minimal. Procedure:                Pre-Anesthesia Assessment:                           - Prior to the procedure, a History and Physical                            was performed, and patient medications and                            allergies were reviewed. The patient's tolerance of                            previous anesthesia was also reviewed. The risks                            and benefits of the procedure and the sedation                            options and risks were discussed with the patient.                            All questions were answered, and informed consent                            was obtained. Prior Anticoagulants: The patient has                            taken no previous anticoagulant or antiplatelet  agents except for NSAID medication. ASA Grade                            Assessment: III - A patient with severe systemic                            disease. After reviewing the risks and benefits,                            the patient was deemed in satisfactory condition to                             undergo the procedure.                           After obtaining informed consent, the endoscope was                            passed under direct vision. Throughout the                            procedure, the patient's blood pressure, pulse, and                            oxygen saturations were monitored continuously. The                            GIF-H190 ([id number]) Olympus gastroscope was                            introduced through the mouth, and advanced to the                            second part of duodenum. The Olympus [id number]-Q180V                            [id number]) was introduced through the mouth, and                            advanced to the second part of duodenum. The                            [id number] ([id number]) Olympus Linear EUS was                            introduced through the mouth, and advanced to the                            duodenum for ultrasound examination from the                            stomach and duodenum. After obtaining informed  consent, the endoscope was passed under direct                            vision. Throughout the procedure, the patient's                            blood pressure, pulse, and oxygen saturations were                            monitored continuously.The upper EUS was                            accomplished without difficulty. The patient                            tolerated the procedure. Scope In: Scope Out: Findings:      ENDOSCOPIC FINDING: :      No gross lesions were noted in the proximal esophagus and in the mid       esophagus.      White nummular lesions were noted in the distal esophagus - improved       from prior when I diagnosed him with candida. Biopsies were taken with a       cold forceps for histology to see if residual candida is present.      The Z-line was irregular and was found 43 cm from the incisors.      Scattered severe inflammation characterized by erosions,  erythema and       granularity was found in the gastric body, at the incisura and in the       gastric antrum.      No other gross lesions were noted in the entire examined stomach.       Biopsies were taken with a cold forceps for histology and Helicobacter       pylori testing.      No gross lesions were noted in the duodenal bulb, in the first portion       of the duodenum and in the second portion of the duodenum.      The major papilla was normal and found under a hood.      ENDOSONOGRAPHIC FINDING: :      There was a suggestion of a narrowing/stricture in the distal common       bile duct.      The common bile duct which measured up to 5.2 mm.      An irregular area was identified in the pancreatic head. The area was       homogenous, mass-like with some calcifications noted. The region       measured 29 mm by 29 mm in maximal cross-sectional diameter. The outer       margins were irregular. This is exactly where the distal common bile       duct becomes narrowed/strictured. An intact interface was seen between       the area and the superior mesenteric artery and celiac trunk suggesting       a lack of invasion. The remainder of the pancreas was examined. The       endosonographic appearance of parenchyma is noted below and the upstream       pancreatic duct is noted below. Because of the  previous atypical cells       (favored to be reactive) and the elevated CA19-9, a fine needle biopsy       was performed of this area. Color Doppler imaging was utilized prior to       needle puncture to confirm a lack of significant vascular structures       within the needle path. Six passes were made with the 22 gauge SharkCore       biopsy needle using a transduodenal approach. The region being biopsied       is more fibrotic than would normally be felt with the needle passage -       query the chronic pancreaittis changes. Visible cores of tissue were       obtained. Preliminary cytologic  examination and touch preps were       performed. Final cytology results are pending.      Pancreatic parenchymal abnormalities were noted in the entire pancreas.       These consisted of hyperechoic foci with shadowing, lobularity without       honeycombing and hyperechoic strands.      The pancreatic duct had an irregularly contoured endosonographic       appearance and had a tortuous/ectatic appearance. In the pancreatic head       (PD=1.7 mm), genu of the pancreas (PD=3.1 mm), body of the pancreas       (PD=2.3 mm) and tail of the pancreas (PD=2.1 mm).      Endosonographic imaging of the ampulla showed no intramural       (subepithelial) lesion.      Endosonographic imaging in the visualized portion of the liver showed no       mass.      The celiac region was visualized. Impression:               EGD Impression:                           - No gross lesions in esophagus proximally. White                            nummular lesions in esophageal mucosa distally -                            biopsied to rule out Candida.                           - Z-line irregular, 43 cm from the incisors.                           - Erosive gastritis noted. No other gross lesions                            in the stomach. Biopsied.                           - No gross lesions in the duodenal bulb, in the                            first portion of the duodenum and in the second  portion of the duodenum.                           - Normal major papilla under a hood.                           EUS Impression:                           - There was a suggestion of a stricture/narrowing                            in the common bile duct. The CBD measured 5.2 mm.                           - A mass-like region was identified in the                            pancreatic head where the CBD narrowing is noted.                            Due to elevated CA19-9 and imaging suggestive of  a                            mass (though has been present since at least 2020                            per last MRI report), FNB was performed. Tissue was                            obtained from this exam and pending. However, the                            endosonographic appearance is suggestive of benign                            inflammatory changes vs possibility of                            adenocarcinoma. If this turns out to be an                            adenocarcinoma, it would be staged T2 N0 Mx by                            endosonographic criteria.                           - Pancreatic parenchymal abnormalities consisting                            of hyperechoic foci, lobularity and hyperechoic  strands were noted in the entire pancreas.                            Consistent with known chronic pancreatitis.                           - The pancreatic duct had an irregularly contoured                            endosonographic appearance and had a                            tortuous/ectatic appearance. Moderate Sedation:      Not Applicable - Patient had care per Anesthesia. Recommendation:           - The patient will be observed post-procedure,                            until all discharge criteria are met.                           - Discharge patient to home.                           - Patient has a contact number available for                            emergencies. The signs and symptoms of potential                            delayed complications were discussed with the                            patient. Return to normal activities tomorrow.                            Written discharge instructions were provided to the                            patient.                           - Low fat diet.                           - Observe patient's clinical course.                           - Increase PPI to 40 mg twice daily.                            - Try to minimize NSAID use (Naproxen) as this is                            likely causing peptic ulcer disease at this time.                           -  Observe patient's clinical course.                           - Await cytology results and await path results.                           - If pathology is non-diagnostic or still atypical                            without a clear diagnosis, I would recommend repeat                            cross-sectional imaging with a pancreas protocol CT                            in the next 3-4 weeks. Repeat CA19-9 at that time.                            Presentation of his case at [facility] is reasonable. If                            any further EUS attempts are felt to be indicated,                            a second operator should be asked to perform.                           - Referral to Pain clinic for treatment of chronic                            pancreatitis pain, is reasonable.                           - The findings and recommendations were discussed                            with the patient.                           - The findings and recommendations were discussed                            with the patient's family. Procedure Code(s):        --- Professional ---                           [phone], Esophagogastroduodenoscopy, flexible,                            transoral; with transendoscopic ultrasound-guided                            intramural or transmural fine needle  aspiration/biopsy(s), (includes endoscopic                            ultrasound examination limited to the esophagus,                            stomach or duodenum, and adjacent structures) Diagnosis Code(s):        --- Professional ---                           K22.8, Other specified diseases of esophagus                           K29.70, Gastritis, unspecified, without bleeding                           K86.89, Other  specified diseases of pancreas                           K86.9, Disease of pancreas, unspecified                           R93.3, Abnormal findings on diagnostic imaging of                            other parts of digestive tract                           K83.1, Obstruction of bile duct                           R97.8, Other abnormal tumor markers                           K86.1, Other chronic pancreatitis                           R10.84, Generalized abdominal pain                           K83.8, Other specified diseases of biliary tract                           R93.2, Abnormal findings on diagnostic imaging of                            liver and biliary tract CPT copyright 2019 [employer]. All rights reserved. The codes documented in this report are preliminary and upon coder review may  be revised to meet current compliance requirements. Justice Britain, MD 05/22/2021 12:42:15 PM Number of Addenda: 0

## 2021-05-22 NOTE — Anesthesia Preprocedure Evaluation (Addendum)
Anesthesia Evaluation  Patient identified by MRN, date of birth, ID band Patient awake    Reviewed: Allergy & Precautions, NPO status , Patient's Chart, lab work & pertinent test results  History of Anesthesia Complications Negative for: history of anesthetic complications  Airway Mallampati: II  TM Distance: >3 FB Neck ROM: Full    Dental  (+) Poor Dentition, Missing, Chipped   Pulmonary COPD, Current Smoker,    Pulmonary exam normal        Cardiovascular + Peripheral Vascular Disease  Normal cardiovascular exam     Neuro/Psych  Neuromuscular disease (neuropathy) negative psych ROS   GI/Hepatic negative GI ROS, (+)     substance abuse  alcohol use, Hepatitis -, C  Endo/Other  negative endocrine ROS  Renal/GU negative Renal ROS     Musculoskeletal  (+) Arthritis ,   Abdominal   Peds  Hematology negative hematology ROS (+)   Anesthesia Other Findings Covid test negative   Reproductive/Obstetrics                            Anesthesia Physical Anesthesia Plan  ASA: 3  Anesthesia Plan: MAC   Post-op Pain Management:    Induction: Intravenous  PONV Risk Score and Plan: 1 and Propofol infusion and Treatment may vary due to age or medical condition  Airway Management Planned: Nasal Cannula and Natural Airway  Additional Equipment: None  Intra-op Plan:   Post-operative Plan:   Informed Consent: I have reviewed the patients History and Physical, chart, labs and discussed the procedure including the risks, benefits and alternatives for the proposed anesthesia with the patient or authorized representative who has indicated his/her understanding and acceptance.       Plan Discussed with: CRNA and Anesthesiologist  Anesthesia Plan Comments:        Anesthesia Quick Evaluation

## 2021-05-22 NOTE — Progress Notes (Addendum)
Patient evaluated post EUS with fine-needle biopsy. Patient's preoperative pain was rated 7/8/9 before our procedure. Patient describes pain in the postprocedure recovery area as greater than 10. Anesthesia has offered the patient Percocet. I will obtain abdominal imaging to rule out free air/perforation which is felt to be unlikely. If patient's pain is not able to be controlled with Percocet he may need further medications via IV or potential inpatient observation. I will reach out to the hospitalist team about potentially bringing the patient into the hospital if he continues to have significant pain post procedure.   Justice Britain, MD [facility] Office # [phone]   Patient discomfort improved.  Will send home with 8 Percocet total to try and get him through any additional discomfort for the next 2-days.  If pain is so significant or he cannot tolerate oral intake then will need to come into hospital for evaluation for post-procedural complications.  Patient aware no further narcotic medications will be given by this prescriber or [facility] moving forward.   Justice Britain, MD [facility] Office # [phone]

## 2021-05-22 NOTE — Anesthesia Postprocedure Evaluation (Signed)
Anesthesia Post Note  Patient: Avenir N Lindsley  Procedure(s) Performed: UPPER ENDOSCOPIC ULTRASOUND (EUS) RADIAL ESOPHAGOGASTRODUODENOSCOPY (EGD) WITH PROPOFOL BIOPSY FINE NEEDLE ASPIRATION (FNA) LINEAR     Patient location during evaluation: PACU Anesthesia Type: MAC Level of consciousness: awake and alert Pain management: pain level controlled Vital Signs Assessment: post-procedure vital signs reviewed and stable Respiratory status: spontaneous breathing, nonlabored ventilation and respiratory function stable Cardiovascular status: stable and blood pressure returned to baseline Anesthetic complications: no   No notable events documented.  Last Vitals:  Vitals:   05/22/21 1240 05/22/21 1250  BP: (!) 143/103 138/87  Pulse: 63 (!) 59  Resp: 16 20  Temp:    SpO2: 97% 97%                  Audry Pili

## 2021-05-22 NOTE — Discharge Instructions (Signed)
YOU HAD AN ENDOSCOPIC PROCEDURE TODAY: Refer to the procedure report and other information in the discharge instructions given to you for any specific questions about what was found during the examination. If this information does not answer your questions, please call [place] office at [phone] to clarify.   YOU SHOULD EXPECT: Some feelings of bloating in the abdomen. Passage of more gas than usual. Walking can help get rid of the air that was put into your GI tract during the procedure and reduce the bloating. If you had a lower endoscopy (such as a colonoscopy or flexible sigmoidoscopy) you may notice spotting of blood in your stool or on the toilet paper. Some abdominal soreness may be present for a [place] or two, also.  DIET: Your first meal following the procedure should be a light meal and then it is ok to progress to your normal diet. A half-sandwich or bowl of soup is an example of a good first meal. Heavy or fried foods are harder to digest and may make you feel nauseous or bloated. Drink plenty of fluids but you should avoid alcoholic beverages for 24 hours. If you had a esophageal dilation, please see attached instructions for diet.    ACTIVITY: Your care partner should take you home directly after the procedure. You should plan to take it easy, moving slowly for the rest of the [place]. You can resume normal activity the [place] after the procedure however YOU SHOULD NOT DRIVE, use power tools, machinery or perform tasks that involve climbing or major physical exertion for 24 hours (because of the sedation medicines used during the test).   SYMPTOMS TO REPORT IMMEDIATELY: A gastroenterologist can be reached at any hour. Please call [phone]  for any of the following symptoms:   Following upper endoscopy (EGD, EUS, ERCP, esophageal dilation) Vomiting of blood or coffee ground material  New, significant abdominal pain  New, significant chest pain or pain under the shoulder blades  Painful or  persistently difficult swallowing  New shortness of breath  Black, tarry-looking or red, bloody stools  FOLLOW UP:  If any biopsies were taken you will be contacted by phone or by letter within the next 1-3 weeks. Call [phone]  if you have not heard about the biopsies in 3 weeks.  Please also call with any specific questions about appointments or follow up tests.

## 2021-05-22 NOTE — Transfer of Care (Signed)
Immediate Anesthesia Transfer of Care Note  Patient: Wesley [name]  Procedure(s) Performed: UPPER ENDOSCOPIC ULTRASOUND (EUS) RADIAL ESOPHAGOGASTRODUODENOSCOPY (EGD) WITH PROPOFOL BIOPSY FINE NEEDLE ASPIRATION (FNA) LINEAR  Patient Location: Endoscopy Unit  Anesthesia Type:MAC  Level of Consciousness: drowsy and responds to stimulation  Airway & Oxygen Therapy: Patient Spontanous Breathing and Patient connected to face mask oxygen  Post-op Assessment: Report given to RN, Post -op Vital signs reviewed and stable and Patient moving all extremities  Post vital signs: Reviewed and stable  Last Vitals:  Vitals Value Taken Time  BP 131/71 05/22/21 1212  Temp 35.8 C 05/22/21 1212  Pulse 72 05/22/21 1216  Resp 21 05/22/21 1216  SpO2 99 % 05/22/21 1216  Vitals shown include unvalidated device data.  Last Pain:  Vitals:   05/22/21 1212  TempSrc: Axillary  PainSc: 0-No pain      Patients Stated Pain Goal: 3 (21/97/58 8325)  Complications: No notable events documented.
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
## 2021-05-23 ENCOUNTER — Encounter (HOSPITAL_COMMUNITY): Payer: Self-pay | Admitting: Gastroenterology

## 2021-05-23 ENCOUNTER — Encounter: Payer: Self-pay | Admitting: Gastroenterology

## 2021-05-23 LAB — SURGICAL PATHOLOGY

## 2021-05-24 LAB — CYTOLOGY - NON PAP

## 2021-05-27 NOTE — Progress Notes (Signed)
6/27 Left message on machine to call back

## 2021-05-28 ENCOUNTER — Telehealth: Payer: Self-pay | Admitting: General Surgery

## 2021-05-28 NOTE — Telephone Encounter (Signed)
LVM for the patient to call and schedule a follow up appointment to see Dr [name].

## 2021-05-28 NOTE — Telephone Encounter (Signed)
-----  Message from [place], DO sent at 05/26/2021  7:53 AM EDT ----- Thanks for the follow-up on his repeat EUS and path. Will plan for repeat imaging and followup with me in the office as planned, and likely discuss at [facility] to lay out a plan moving forward for him.  Olivia Mackie, can you please make sure this patient is set up for repeat imaging and f/u with me as outlined below? Thanks.  VC ----- Message ----- From: Irving Copas., MD Sent: 05/25/2021  10:14 PM EDT To: Timothy Lasso, RN, [name], MD, #  Patty, I have released the results letter to you to be sent to the patient. Please let the patient know that his repeat biopsies of the pancreas head still show no evidence of cancer.  This is good news, but we still need to be vigilant and work on understanding why his pain seems to be out of proportion to what would be expected in most individuals with chronic pancreatitis. The patient should undergo a pancreas protocol CT abdomen in the next 2 to 3 months (placed under Dr. [name] name). Follow-up in clinic with Dr. [name] should be set up in the next 4 to 5 weeks. Patient should have a pain specialist referral for chronic pancreatitis pain management.  VC, we can touch base further when I return if there are other questions but please let me know what the pancreas protocol CT shows.  If the patient is progressing or worsening then certainly get the scan earlier and we can discuss at multidisciplinary conference findings and where to go from there.  Thanks to all. GM

## 2021-06-12 ENCOUNTER — Other Ambulatory Visit: Payer: Self-pay

## 2021-06-12 ENCOUNTER — Ambulatory Visit: Payer: Medicare Other | Attending: Critical Care Medicine | Admitting: Critical Care Medicine

## 2021-06-12 ENCOUNTER — Encounter: Payer: Self-pay | Admitting: Critical Care Medicine

## 2021-06-12 VITALS — BP 115/78 | HR 84 | Resp 16 | Wt 152.0 lb

## 2021-06-12 DIAGNOSIS — G629 Polyneuropathy, unspecified: Secondary | ICD-10-CM

## 2021-06-12 DIAGNOSIS — M5441 Lumbago with sciatica, right side: Secondary | ICD-10-CM

## 2021-06-12 DIAGNOSIS — G8929 Other chronic pain: Secondary | ICD-10-CM

## 2021-06-12 DIAGNOSIS — M48062 Spinal stenosis, lumbar region with neurogenic claudication: Secondary | ICD-10-CM

## 2021-06-12 DIAGNOSIS — G2581 Restless legs syndrome: Secondary | ICD-10-CM

## 2021-06-12 DIAGNOSIS — F0631 Mood disorder due to known physiological condition with depressive features: Secondary | ICD-10-CM

## 2021-06-12 DIAGNOSIS — M5442 Lumbago with sciatica, left side: Secondary | ICD-10-CM

## 2021-06-12 DIAGNOSIS — M792 Neuralgia and neuritis, unspecified: Secondary | ICD-10-CM

## 2021-06-12 MED ORDER — DULOXETINE HCL 60 MG PO CPEP
60.0000 mg | ORAL_CAPSULE | Freq: Every day | ORAL | 3 refills | Status: DC
  Filled 2021-06-12: qty 30, 30d supply, fill #0
  Filled 2021-09-08: qty 30, 30d supply, fill #1

## 2021-06-12 MED ORDER — TIZANIDINE HCL 4 MG PO TABS
4.0000 mg | ORAL_TABLET | Freq: Every evening | ORAL | 1 refills | Status: DC | PRN
  Filled 2021-06-12: qty 60, 30d supply, fill #0

## 2021-06-12 MED ORDER — FOLIC ACID 1 MG PO TABS
1.0000 mg | ORAL_TABLET | Freq: Every day | ORAL | 0 refills | Status: DC
  Filled 2021-06-12: qty 30, 30d supply, fill #0

## 2021-06-12 MED ORDER — ROPINIROLE HCL 1 MG PO TABS
1.0000 mg | ORAL_TABLET | Freq: Every evening | ORAL | 1 refills | Status: DC | PRN
  Filled 2021-06-12: qty 30, 30d supply, fill #0

## 2021-06-12 MED ORDER — PREGABALIN 150 MG PO CAPS
150.0000 mg | ORAL_CAPSULE | Freq: Two times a day (BID) | ORAL | 6 refills | Status: DC
  Filled 2021-06-12 – 2021-06-13 (×2): qty 60, 30d supply, fill #0
  Filled 2021-08-01: qty 60, 30d supply, fill #1
  Filled 2021-09-04: qty 60, 30d supply, fill #2

## 2021-06-12 MED ORDER — ALBUTEROL SULFATE HFA 108 (90 BASE) MCG/ACT IN AERS
2.0000 | INHALATION_SPRAY | Freq: Four times a day (QID) | RESPIRATORY_TRACT | 0 refills | Status: DC | PRN
  Filled 2021-06-12: qty 8.5, 25d supply, fill #0

## 2021-06-12 MED FILL — Ondansetron HCl Tab 4 MG: ORAL | 6 days supply | Qty: 20 | Fill #0 | Status: AC

## 2021-06-12 MED FILL — Pregabalin Cap 150 MG: ORAL | 30 days supply | Qty: 60 | Fill #1 | Status: CN

## 2021-06-12 NOTE — Patient Instructions (Signed)
#  Take all your medications as prescribed. #Follow up with pain clinic for chronic back pain. #Follow with GI next week #Report new or worsening symptom to the clinic or local ER. #Cut back on smoking #Stay hydrated and exercise.

## 2021-06-12 NOTE — Progress Notes (Signed)
Established Patient Office Visit  Subjective:  Patient ID: Wesley [name], male    DOB: 10/19/1966  Age: 55 y.o. MRN: [id number]  CC:  Chief Complaint  Patient presents with   Back Pain    HPI Wesley [name] presents for neuropathy and back pain. Has a history of peripheral neuropathy, chronic back pain, COPD and RLS among others. Patient says he recently had endoscopic biopsy and has an upcoming appointment with GI next week. Takes Lyrica 120m PO bid, his neuropathy is worse at night when he is laying down. Denies CP, SOB, cough, fever, chills or any other symptoms than back pain and intermittent numbness and tingling to his lower extremities. Referred to pain clinic, but hasn't gone yet. Continues to smoke 1 pack of cigarettes per [place].   Past Medical History:  Diagnosis Date   Acute pancreatitis    Alcohol abuse    quit 04/2019   Cholecystitis 01/2018   [place]    EMPHYSEMATOUS BLEB 08/07/2010   Qualifier: Diagnosis of  By: WMelvyn NovasMD, MChristena Deem   History of blood transfusion    Neuropathy    left leg, feet   Radial nerve compression    right   Spinal stenosis, lumbar    SPONTANEOUS PNEUMOTHORAX 08/07/2010   Qualifier: History of  By: RTilden Dome     Past Surgical History:  Procedure Laterality Date   arm surgery Right 2017-2018   "surgery on nerves in right neck"   BIOPSY  03/21/2021   Procedure: BIOPSY;  Surgeon: MIrving Copas, MD;  [facility] [facility]  Service: Gastroenterology;;   BIOPSY  05/22/2021   Procedure: BIOPSY;  Surgeon: MIrving Copas, MD;  [facility];  Service: Gastroenterology;;   CHOLECYSTECTOMY N/A 12/13/2018   Procedure: LAPAROSCOPIC CHOLECYSTECTOMY WITH INTRAOPERATIVE CHOLANGIOGRAM;  Surgeon: TDonnie Mesa MD;  [facility]  Service: General;  Laterality: N/A;   COLONOSCOPY     COLOSTOMY  1987   [facility]   COLOSTOMY TAKEDOWN  1998   ESOPHAGOGASTRODUODENOSCOPY (EGD) WITH PROPOFOL N/A 03/21/2021    Procedure: ESOPHAGOGASTRODUODENOSCOPY (EGD) WITH PROPOFOL;  Surgeon: MIrving Copas, MD;  [place] [facility]  Service: Gastroenterology;  Laterality: N/A;   ESOPHAGOGASTRODUODENOSCOPY (EGD) WITH PROPOFOL N/A 05/22/2021   Procedure: ESOPHAGOGASTRODUODENOSCOPY (EGD) WITH PROPOFOL;  Surgeon: MRush LandmarkGTelford Nab, MD;  [facility];  Service: Gastroenterology;  Laterality: N/A;   EUS N/A 03/21/2021   Procedure: UPPER ENDOSCOPIC ULTRASOUND (EUS) RADIAL;  Surgeon: MIrving Copas, MD;  [place] [facility]  Service: Gastroenterology;  Laterality: N/A;   EUS N/A 05/22/2021   Procedure: UPPER ENDOSCOPIC ULTRASOUND (EUS) RADIAL;  Surgeon: MIrving Copas, MD;  [facility];  Service: Gastroenterology;  Laterality: N/A;   FINE NEEDLE ASPIRATION  05/22/2021   Procedure: FINE NEEDLE ASPIRATION (FNA) LINEAR;  Surgeon: MIrving Copas, MD;  [facility];  Service: Gastroenterology;;  used covidien sharkCore 22gauge needle   FINE NEEDLE ASPIRATION BIOPSY  03/21/2021   Procedure: FINE NEEDLE ASPIRATION BIOPSY;  Surgeon: MIrving Copas, MD;  [place] [facility]  Service: Gastroenterology;;   UAleatha BorerNERVE TRANSPOSITION Right 05/31/2015   Procedure: RIGHT RADIAL TUNNEL RELEASE ;  Surgeon: DKathryne Hitch MD;  [place] [facility]  Service: Orthopedics;  Laterality: Right;    Family History  Problem Relation Age of Onset   Hypertension Mother    Cirrhosis Father 446  Kidney cancer Father    Multiple sclerosis Sister    Liver cancer  Maternal Grandmother    Colon cancer Cousin 23       Mother's niece   Esophageal cancer Neg Hx    Stomach cancer Neg Hx    Rectal cancer Neg Hx     Social History   Socioeconomic History   Marital status: Single    Spouse name: Not on file   Number of children: 1   Years of education: 10   Highest education level: Not on file  Occupational History   Occupation: unemployed -  fired for drinking on the job    Comment: unemployed  Tobacco Use   Smoking status: Every Schoonmaker    Packs/Cross: 0.75    Years: 42.00    Pack years: 31.50    Types: Cigarettes   Smokeless tobacco: Never  Vaping Use   Vaping Use: Never used  Substance and Sexual Activity   Alcohol use: Not Currently    Alcohol/week: 25.0 standard drinks    Types: 25 Standard drinks or equivalent per week    Comment: stopped 04/2019   Drug use: No   Sexual activity: Yes    Partners: Female    Birth control/protection: None  Other Topics Concern   Not on file  Social History Narrative   Left handed   Lives in a single story home with fiance.   Caffeine- sodas, 7 -8 cans   Social Determinants of Health   Financial Resource Strain: Not on file  Food Insecurity: Not on file  Transportation Needs: Not on file  Physical Activity: Not on file  Stress: Not on file  Social Connections: Not on file  Intimate Partner Violence: Not on file    Outpatient Medications Prior to Visit  Medication Sig Dispense Refill   budesonide-formoterol (SYMBICORT) 160-4.5 MCG/ACT inhaler Inhale 2 puffs into the lungs 2 (two) times daily. (Patient taking differently: Inhale 2 puffs into the lungs 2 (two) times daily as needed (shortness of breath).) 1 each 12   lipase/protease/amylase ([name]) 36000 UNITS CPEP capsule Take 2 capsules (72,000 Units total) by mouth 3 (three) times daily with meals AND 1 capsule (36,000 Units total) with snacks. 280 capsule 1   Multiple Vitamin (MULTIVITAMIN) tablet Take 1 tablet by mouth daily.     nicotine polacrilex (COMMIT) 4 MG lozenge TAKE THREE A [place] TO STOP SMOKING 100 lozenge 4   ondansetron (ZOFRAN) 4 MG tablet TAKE 1 TABLET (4 MG TOTAL) BY MOUTH EVERY 8 (EIGHT) HOURS AS NEEDED FOR NAUSEA OR VOMITING. (Patient taking differently: Take 4 mg by mouth every 8 (eight) hours as needed for nausea or vomiting.) 20 tablet 3   oxyCODONE-acetaminophen (PERCOCET) 7.5-325 MG tablet Take 1 tablet by  mouth every 8 (eight) hours as needed for severe pain. 8 tablet 0   pantoprazole (PROTONIX) 40 MG tablet Take 1 tablet (40 mg total) by mouth 2 (two) times daily before a meal. 60 tablet 6   polyethylene glycol (MIRALAX) 17 g packet Take 17 g by mouth daily as needed for moderate constipation. 30 each 0   Probiotic Product (ALIGN) 4 MG CAPS Take 4 mg by mouth daily.     sulindac (CLINORIL) 150 MG tablet Take 150 mg by mouth 2 (two) times daily.     tamsulosin (FLOMAX) 0.4 MG CAPS capsule TAKE 1 CAPSULE (0.4 MG TOTAL) BY MOUTH DAILY. (Patient taking differently: Take 0.4 mg by mouth daily.) 30 capsule 3   thiamine 100 MG tablet TAKE 1 TABLET (100 MG TOTAL) BY MOUTH DAILY. (Patient not taking: Reported  on 05/16/2021) 30 tablet 6   triamcinolone ointment (KENALOG) 0.1 % APPLY 1 APPLICATION ON THE SKIN DAILY (Patient not taking: No sig reported) 80 g 0   vitamin B-12 (CYANOCOBALAMIN) 1000 MCG tablet Take 1 tablet (1,000 mcg total) by mouth daily. Due to vitamin B12 deficiency (Patient taking differently: Take 1,000 mcg by mouth daily.) 30 tablet 6   albuterol (VENTOLIN HFA) 108 (90 Base) MCG/ACT inhaler Inhale 2 puffs into the lungs every 6 (six) hours as needed for wheezing or shortness of breath. 8 g 0   DULoxetine (CYMBALTA) 60 MG capsule Take 1 capsule (60 mg total) by mouth daily. 30 capsule 3   folic acid (FOLVITE) 1 MG tablet Take 1 tablet (1 mg total) by mouth daily. 30 tablet 0   oxyCODONE-acetaminophen (PERCOCET) 7.5-325 MG tablet Take 1 tablet by mouth every 6 (six) hours as needed for severe pain. 40 tablet 0   pregabalin (LYRICA) 150 MG capsule TAKE 1 CAPSULE (150 MG TOTAL) BY MOUTH 2 (TWO) TIMES DAILY. FOR NERVE PAIN (Patient taking differently: Take 150 mg by mouth 2 (two) times daily.) 180 capsule 1   rOPINIRole (REQUIP) 1 MG tablet TAKE 1 TABLET (1 MG TOTAL) BY MOUTH AT BEDTIME. (Patient taking differently: Take 1 mg by mouth at bedtime as needed (restless legs).) 30 tablet 2   Thiamine  HCl (VITAMIN B-1) 250 MG tablet Take 250 mg by mouth daily.     tiZANidine (ZANAFLEX) 4 MG tablet TAKE 1-2 TABS BY MOUTH EVERY NIGHT AT BEDTIME AS NEEDED (Patient taking differently: Take 4-8 mg by mouth at bedtime as needed for muscle spasms.) 40 tablet 1   No facility-administered medications prior to visit.    No Known Allergies  ROS Review of Systems  Constitutional: Negative.   Respiratory: Negative.    Cardiovascular: Negative.   Gastrointestinal: Negative.  Negative for abdominal pain.     Objective:    Physical Exam Constitutional:      Appearance: Normal appearance. He is normal weight.  Cardiovascular:     Rate and Rhythm: Normal rate and regular rhythm.  Pulmonary:     Effort: Pulmonary effort is normal.     Breath sounds: Normal breath sounds.  Musculoskeletal:        General: Normal range of motion.  Neurological:     Mental Status: He is alert.    BP 115/78   Pulse 84   Resp 16   Wt 152 lb (68.9 kg)   SpO2 98%   BMI 23.11 kg/m  Wt Readings from Last 3 Encounters:  06/12/21 152 lb (68.9 kg)  05/22/21 145 lb (65.8 kg)  03/20/21 157 lb 6.5 oz (71.4 kg)     Health Maintenance Due  Topic Date Due   Zoster Vaccines- Shingrix (1 of 2) Never done   Pneumococcal Vaccine 16-72 Years old (2 - PCV) 04/13/2020   COVID-19 Vaccine (4 - Booster for Pfizer series) 04/20/2021    There are no preventive care reminders to display for this patient.  Lab Results  Component Value Date   TSH 1.310 05/02/2020   Lab Results  Component Value Date   WBC 9.4 02/05/2021   HGB 16.7 02/05/2021   HCT 49.2 02/05/2021   MCV 92.7 02/05/2021   PLT 312.0 02/05/2021   Lab Results  Component Value Date   NA 135 04/05/2021   K 4.2 04/05/2021   CO2 25 04/05/2021   GLUCOSE 88 04/05/2021   BUN 8 04/05/2021   CREATININE 1.01 04/05/2021  BILITOT 1.6 (H) 04/05/2021   ALKPHOS 317 (H) 04/05/2021   AST 53 (H) 04/05/2021   ALT 151 (H) 04/05/2021   PROT 8.2 04/05/2021    ALBUMIN 4.4 04/05/2021   CALCIUM 9.7 04/05/2021   ANIONGAP 13 01/07/2021   GFR 83.36 04/05/2021   Lab Results  Component Value Date   CHOL 108 02/08/2018   Lab Results  Component Value Date   HDL 18 (L) 02/08/2018   Lab Results  Component Value Date   LDLCALC 75 02/08/2018   Lab Results  Component Value Date   TRIG 66 12/31/2020   Lab Results  Component Value Date   CHOLHDL 6.0 02/08/2018   Lab Results  Component Value Date   HGBA1C 5.3 05/23/2019      Assessment & Plan:   Problem List Items Addressed This Visit       Nervous and Auditory   Neuropathy    #Take medications as prescribed #Report new or worsening symptoms to the clinic or local ER. #Elevate legs while sitting.        Relevant Medications   rOPINIRole (REQUIP) 1 MG tablet   Chronic midline low back pain with bilateral sciatica - Primary    #Reschedule the appointment with pain clinic for chronic back pain #Use hot packs as needed #Report new or worsening pain to the clinic or local ED.       Relevant Medications   DULoxetine (CYMBALTA) 60 MG capsule   pregabalin (LYRICA) 150 MG capsule   rOPINIRole (REQUIP) 1 MG tablet   tiZANidine (ZANAFLEX) 4 MG tablet     Other   Neurogenic claudication due to lumbar spinal stenosis   Relevant Medications   DULoxetine (CYMBALTA) 60 MG capsule   pregabalin (LYRICA) 150 MG capsule   tiZANidine (ZANAFLEX) 4 MG tablet   Other Visit Diagnoses     Depression due to physical illness       Relevant Medications   DULoxetine (CYMBALTA) 60 MG capsule   Neuropathic pain       Relevant Medications   pregabalin (LYRICA) 150 MG capsule   Restless leg syndrome       Relevant Medications   rOPINIRole (REQUIP) 1 MG tablet       Meds ordered this encounter  Medications   albuterol (VENTOLIN HFA) 108 (90 Base) MCG/ACT inhaler    Sig: Inhale 2 puffs into the lungs every 6 (six) hours as needed for wheezing or shortness of breath.    Dispense:  8 g     Refill:  0    May substitute   DULoxetine (CYMBALTA) 60 MG capsule    Sig: Take 1 capsule (60 mg total) by mouth daily.    Dispense:  30 capsule    Refill:  3   folic acid (FOLVITE) 1 MG tablet    Sig: Take 1 tablet (1 mg total) by mouth daily.    Dispense:  30 tablet    Refill:  0   pregabalin (LYRICA) 150 MG capsule    Sig: Take 1 capsule (150 mg total) by mouth 2 (two) times daily.    Dispense:  60 capsule    Refill:  6   rOPINIRole (REQUIP) 1 MG tablet    Sig: Take 1 tablet (1 mg total) by mouth at bedtime as needed (restless legs).    Dispense:  30 tablet    Refill:  1   tiZANidine (ZANAFLEX) 4 MG tablet    Sig: Take 1-2 tablets (4-8 mg total) by mouth  at bedtime as needed for muscle spasms.    Dispense:  60 tablet    Refill:  1     Follow-up: Return in about 3 months (around 09/12/2021).    Asencion Noble, MD  I have seen and examined this patient with the mid-level provider and agree with the above note . Asencion Noble

## 2021-06-12 NOTE — Assessment & Plan Note (Addendum)
#  Reschedule the appointment with pain clinic for chronic back pain #Use hot packs as needed #Report new or worsening pain to the clinic or local ED.

## 2021-06-12 NOTE — Assessment & Plan Note (Signed)
#  Take medications as prescribed #Report new or worsening symptoms to the clinic or local ER. #Elevate legs while sitting.

## 2021-06-13 ENCOUNTER — Other Ambulatory Visit: Payer: Self-pay

## 2021-06-14 ENCOUNTER — Other Ambulatory Visit: Payer: Self-pay

## 2021-06-19 ENCOUNTER — Ambulatory Visit (INDEPENDENT_AMBULATORY_CARE_PROVIDER_SITE_OTHER): Payer: Medicare Other | Admitting: Gastroenterology

## 2021-06-19 ENCOUNTER — Other Ambulatory Visit (INDEPENDENT_AMBULATORY_CARE_PROVIDER_SITE_OTHER): Payer: Medicare Other

## 2021-06-19 ENCOUNTER — Other Ambulatory Visit: Payer: Self-pay

## 2021-06-19 ENCOUNTER — Encounter: Payer: Self-pay | Admitting: Gastroenterology

## 2021-06-19 VITALS — BP 110/80 | HR 78 | Ht 68.0 in | Wt 153.5 lb

## 2021-06-19 DIAGNOSIS — K860 Alcohol-induced chronic pancreatitis: Secondary | ICD-10-CM | POA: Diagnosis not present

## 2021-06-19 DIAGNOSIS — K859 Acute pancreatitis without necrosis or infection, unspecified: Secondary | ICD-10-CM

## 2021-06-19 DIAGNOSIS — IMO0002 Reserved for concepts with insufficient information to code with codable children: Secondary | ICD-10-CM

## 2021-06-19 DIAGNOSIS — R6881 Early satiety: Secondary | ICD-10-CM | POA: Diagnosis not present

## 2021-06-19 DIAGNOSIS — R1013 Epigastric pain: Secondary | ICD-10-CM | POA: Diagnosis not present

## 2021-06-19 DIAGNOSIS — F17200 Nicotine dependence, unspecified, uncomplicated: Secondary | ICD-10-CM | POA: Diagnosis not present

## 2021-06-19 DIAGNOSIS — K861 Other chronic pancreatitis: Secondary | ICD-10-CM | POA: Diagnosis not present

## 2021-06-19 LAB — HEPATIC FUNCTION PANEL
ALT: 13 U/L (ref 0–53)
AST: 23 U/L (ref 0–37)
Albumin: 4.4 g/dL (ref 3.5–5.2)
Alkaline Phosphatase: 80 U/L (ref 39–117)
Bilirubin, Direct: 0.2 mg/dL (ref 0.0–0.3)
Total Bilirubin: 0.9 mg/dL (ref 0.2–1.2)
Total Protein: 7.7 g/dL (ref 6.0–8.3)

## 2021-06-19 NOTE — Patient Instructions (Signed)
If you are age 56 or older, your body mass index should be between 23-30. Your Body mass index is 23.34 kg/m. If this is out of the aforementioned range listed, please consider follow up with your Primary Care Provider.  If you are age 91 or younger, your body mass index should be between 19-25. Your Body mass index is 23.34 kg/m. If this is out of the aformentioned range listed, please consider follow up with your Primary Care Provider.   Please go to the 2nd floor of this building today and schedule your labwork, [address] [place], [address].  Please go to the first floor of [facility] and schedule your CT of the abdomen.  Your referral to pain management has been sent, they will contact you with an appointment.  Due to recent changes in healthcare laws, you may see the results of your imaging and laboratory studies on MyChart before your provider has had a chance to review them.  We understand that in some cases there may be results that are confusing or concerning to you. Not all laboratory results come back in the same time frame and the provider may be waiting for multiple results in order to interpret others.  Please give [place] 48 hours in order for your provider to thoroughly review all the results before contacting the office for clarification of your results.   Follow up in three months, our schedule will be open for that time in September 2022  Thank you for choosing me and [facility].  Vito Cirigliano, D.O.

## 2021-06-19 NOTE — Progress Notes (Signed)
Chief Complaint:    Chronic pancreatitis, follow-up from EUS  GI History: 55 year old male with a history of COPD, chronic low back pain with sciatica, history of alcohol abuse (abstinent since 01/2018), history of acute recurrent pancreatitis, HCV s/p tx w/ SVR, hepatic steatosis, hx of ccy 12/2018.   Pertinent evaluation to date: -EGD (05/2019, Dr. [name]): Mild gastritis, otherwise normal -Colonoscopy (05/2019, Dr. [name]): 7 polyps (TA x6), sigmoid diverticulosis, small internal hemorrhoids.  Normal TI.  Repeat in 3 years -CT A/P (12/30/2020): Peripancreatic fatty stranding without pseudocyst or necrosis - 12/2020: TG normal.  HIV negative, IgG4 negative -Repeat CT A/P (01/04/2021): Acute, nonnecrotizing pancreatitis, worsening in the pancreatic tail but improved in HOP region - MRCP (02/21/2021): 3.3 x 2.2 x 2.3 cm region in the inferior aspect of the pancreatic head with indeterminate imaging characteristics, similar to MRI from 04/2019.  Focal narrowing of distal CBD shortly before level of ampulla.  Hepatic steatosis. - 01/2021: Normal pancreatic elastase, lipase, CBC, CMP - EUS (03/21/2021, Dr. [name]): Candida esophagitis, mild gastritis, irregular, partially hypoechoic 33 x 28 mm region in pancreas in the region of distal CBD narrowing/stricture (path: Atypical cells favored to be reactive).  Tortuous/ectatic PD.  Features of chronic pancreatitis (atrophy, hyperechoic foci with shadowing, lobularity without honeycombing).  Dilated common hepatic duct with normal CBD. - 4/22: CA 19-9: 38 (ULN 34) - EUS (05/22/2021, Dr. [name]): Severe gastritis.  Narrowing/stricture of distal CBD with CBD measuring 5.2 mm.  Irregular area in the HOP which was homogenous and masslike with calcifications measuring 29 x 29 mm (path: Mild atypia, no malignant cells).  Is located where the CBD becomes narrowed/strictured.  No invasion with local vasculature.  Irregularly contoured PD.  Findings of chronic  pancreatitis.  HPI:     Patient is a 55 y.o. male presenting to the Gastroenterology Clinic for follow-up.  Last seen by me on 02/05/2021.  At that time, was still having intermittent epigastric pain and p.o. intake was still not back at baseline.  Was still smoking 1 PPD.    Since last appointment, has had EUS x2 as outlined above.  EUS demonstrates findings of Chronic Pancreatitis, irregularly contoured PD, suggestion of distal CBD stricture/narrowing, all in the same area of focal area of hypoechoic, irregular appearing area in the head of pancreas.  FNA x2 demonstrates atypical cells favored to be reactive.  Recommendation from Dr. [name] was for repeat CT pancreas protocol in the next 4 weeks or so with repeat CA 19-9.  Also consideration for discussion at multidisciplinary conference and possible referral to [place] [facility] if needed for repeat EUS.  Also recommended referral to Pain Medicine for treatment of pain  2/2 Chronic Pancreatitis.  Today, he states still with similar sxs of intermittent epigastric pain. Has not heard from Pain Management Clinic about setting up appt yet. Will reach out today. Appetite unchanged with occasional post prandial loose stools. No hematochezia, steatorrhea, melena, fever, chills. Weight stable.   He is excited about taking his grandkids to the beach next week.     Review of systems:     No chest pain, no SOB, no fevers, no urinary sx   Past Medical History:  Diagnosis Date   Acute pancreatitis    Alcohol abuse    quit 04/2019   Cholecystitis 01/2018   Chronic hepatitis C ([place])    EMPHYSEMATOUS BLEB 08/07/2010   Qualifier: Diagnosis of  By: [name] MD, [name]    History of blood transfusion  Neuropathy    left leg, feet   Radial nerve compression    right   Spinal stenosis, lumbar    SPONTANEOUS PNEUMOTHORAX 08/07/2010   Qualifier: History of  By: Tilden Dome      Patient's surgical history, family medical history, social  history, medications and allergies were all reviewed in Epic    Current Outpatient Medications  Medication Sig Dispense Refill   albuterol (VENTOLIN HFA) 108 (90 Base) MCG/ACT inhaler Inhale 2 puffs into the lungs every 6 (six) hours as needed for wheezing or shortness of breath. 8.5 g 0   budesonide-formoterol (SYMBICORT) 160-4.5 MCG/ACT inhaler Inhale 2 puffs into the lungs 2 (two) times daily. (Patient taking differently: Inhale 2 puffs into the lungs 2 (two) times daily as needed (shortness of breath).) 1 each 12   DULoxetine (CYMBALTA) 60 MG capsule Take 1 capsule (60 mg total) by mouth daily. 30 capsule 3   folic acid (FOLVITE) 1 MG tablet Take 1 tablet (1 mg total) by mouth daily. 30 tablet 0   lipase/protease/amylase ([name]) 36000 UNITS CPEP capsule Take 2 capsules (72,000 Units total) by mouth 3 (three) times daily with meals AND 1 capsule (36,000 Units total) with snacks. 280 capsule 1   Multiple Vitamin (MULTIVITAMIN) tablet Take 1 tablet by mouth daily.     nicotine polacrilex (COMMIT) 4 MG lozenge TAKE THREE A [place] TO STOP SMOKING 100 lozenge 4   ondansetron (ZOFRAN) 4 MG tablet TAKE 1 TABLET (4 MG TOTAL) BY MOUTH EVERY 8 (EIGHT) HOURS AS NEEDED FOR NAUSEA OR VOMITING. (Patient taking differently: Take 4 mg by mouth every 8 (eight) hours as needed for nausea or vomiting.) 20 tablet 3   oxyCODONE-acetaminophen (PERCOCET) 7.5-325 MG tablet Take 1 tablet by mouth every 8 (eight) hours as needed for severe pain. 8 tablet 0   pantoprazole (PROTONIX) 40 MG tablet Take 1 tablet (40 mg total) by mouth 2 (two) times daily before a meal. 60 tablet 6   polyethylene glycol (MIRALAX) 17 g packet Take 17 g by mouth daily as needed for moderate constipation. 30 each 0   pregabalin (LYRICA) 150 MG capsule Take 1 capsule (150 mg total) by mouth 2 (two) times daily. 60 capsule 6   Probiotic Product (ALIGN) 4 MG CAPS Take 4 mg by mouth daily.     rOPINIRole (REQUIP) 1 MG tablet Take 1 tablet (1 mg total)  by mouth at bedtime as needed (restless legs). 30 tablet 1   sulindac (CLINORIL) 150 MG tablet Take 150 mg by mouth 2 (two) times daily.     tamsulosin (FLOMAX) 0.4 MG CAPS capsule TAKE 1 CAPSULE (0.4 MG TOTAL) BY MOUTH DAILY. (Patient taking differently: Take 0.4 mg by mouth daily.) 30 capsule 3   thiamine 100 MG tablet TAKE 1 TABLET (100 MG TOTAL) BY MOUTH DAILY. (Patient not taking: Reported on 05/16/2021) 30 tablet 6   tiZANidine (ZANAFLEX) 4 MG tablet Take 1-2 tablets (4-8 mg total) by mouth at bedtime as needed for muscle spasms. 60 tablet 1   triamcinolone ointment (KENALOG) 0.1 % APPLY 1 APPLICATION ON THE SKIN DAILY (Patient not taking: No sig reported) 80 g 0   vitamin B-12 (CYANOCOBALAMIN) 1000 MCG tablet Take 1 tablet (1,000 mcg total) by mouth daily. Due to vitamin B12 deficiency (Patient taking differently: Take 1,000 mcg by mouth daily.) 30 tablet 6   No current facility-administered medications for this visit.    Physical Exam:     There were no vitals taken for this  visit.  GENERAL:  Pleasant male in NAD PSYCH: : Cooperative, normal affect EENT:  conjunctiva pink, mucous membranes moist, neck supple without masses CARDIAC:  RRR, no murmur heard, no peripheral edema PULM: Normal respiratory effort, lungs CTA bilaterally, no wheezing ABDOMEN:  Mild TTP in epigastrium. No rebound or peritoneal signs. Nondistended, soft. No obvious masses, no hepatomegaly,  normal bowel sounds SKIN:  turgor, no lesions seen Musculoskeletal:  Normal muscle tone, normal strength NEURO: Alert and oriented x 3, no focal neurologic deficits   IMPRESSION and PLAN:    1) Acute recurrent pancreatitis 2) Epigastric pain 3) Early satiety 4) Tobacco use disorder  - CA 19-9, LAEs - CT pancreas protocol. If still concern for suspicious lesion in HOP, may need to consider referral to academic center for 2nd opintion - Referral to Pain Management for pain 2/2 Chronic Pancreatitis - RTC in 3 months  or sooner prn - Plan for annual micronutrient assessment at f/u, to include Vit D, E, B12, INR, prealbumin and Hgb A1c to eval for pancreatogenic DM - Continue [name] for now - Strongly counseled on smoking cessation again today. He has prescribed cessation medications at home, but has no desire to quit at this time - Continue low fat and small, frequent meals  I spent 35 minutes of time, including in depth chart review, independent review of results as outlined above, communicating results with the patient directly, face-to-face time with the patient, coordinating care, and ordering studies and medications as appropriate, and documentation.           Lavena Bullion ,DO, FACG 06/19/2021, 10:55 AM

## 2021-06-20 LAB — CANCER ANTIGEN 19-9: CA 19-9: 20 U/mL (ref <34)

## 2021-06-27 ENCOUNTER — Other Ambulatory Visit: Payer: Self-pay

## 2021-06-27 DIAGNOSIS — IMO0002 Reserved for concepts with insufficient information to code with codable children: Secondary | ICD-10-CM

## 2021-06-27 DIAGNOSIS — K860 Alcohol-induced chronic pancreatitis: Secondary | ICD-10-CM

## 2021-06-27 DIAGNOSIS — K859 Acute pancreatitis without necrosis or infection, unspecified: Secondary | ICD-10-CM

## 2021-06-27 DIAGNOSIS — R1013 Epigastric pain: Secondary | ICD-10-CM

## 2021-07-01 ENCOUNTER — Other Ambulatory Visit (INDEPENDENT_AMBULATORY_CARE_PROVIDER_SITE_OTHER): Payer: Medicare Other

## 2021-07-01 DIAGNOSIS — K859 Acute pancreatitis without necrosis or infection, unspecified: Secondary | ICD-10-CM

## 2021-07-01 DIAGNOSIS — K860 Alcohol-induced chronic pancreatitis: Secondary | ICD-10-CM | POA: Diagnosis not present

## 2021-07-01 DIAGNOSIS — R1013 Epigastric pain: Secondary | ICD-10-CM | POA: Diagnosis not present

## 2021-07-01 LAB — COMPREHENSIVE METABOLIC PANEL
ALT: 17 U/L (ref 0–53)
AST: 26 U/L (ref 0–37)
Albumin: 4.3 g/dL (ref 3.5–5.2)
Alkaline Phosphatase: 89 U/L (ref 39–117)
BUN: 10 mg/dL (ref 6–23)
CO2: 25 mEq/L (ref 19–32)
Calcium: 9.7 mg/dL (ref 8.4–10.5)
Chloride: 99 mEq/L (ref 96–112)
Creatinine, Ser: 1.14 mg/dL (ref 0.40–1.50)
GFR: 71.97 mL/min (ref >60.00)
Glucose, Bld: 86 mg/dL (ref 70–99)
Potassium: 4.4 mEq/L (ref 3.5–5.1)
Sodium: 135 mEq/L (ref 135–145)
Total Bilirubin: 1.3 mg/dL — ABNORMAL HIGH (ref 0.2–1.2)
Total Protein: 8.5 g/dL — ABNORMAL HIGH (ref 6.0–8.3)

## 2021-07-02 DIAGNOSIS — Z79899 Other long term (current) drug therapy: Secondary | ICD-10-CM | POA: Diagnosis not present

## 2021-07-02 DIAGNOSIS — Z79891 Long term (current) use of opiate analgesic: Secondary | ICD-10-CM | POA: Diagnosis not present

## 2021-07-02 DIAGNOSIS — M545 Low back pain, unspecified: Secondary | ICD-10-CM | POA: Diagnosis not present

## 2021-07-04 ENCOUNTER — Encounter (HOSPITAL_BASED_OUTPATIENT_CLINIC_OR_DEPARTMENT_OTHER): Payer: Self-pay

## 2021-07-04 ENCOUNTER — Ambulatory Visit (HOSPITAL_BASED_OUTPATIENT_CLINIC_OR_DEPARTMENT_OTHER)
Admission: RE | Admit: 2021-07-04 | Discharge: 2021-07-04 | Disposition: A | Discharge status: Home / Self Care | Payer: Medicare Other | Source: Ambulatory Visit | Attending: Gastroenterology | Admitting: Gastroenterology

## 2021-07-04 ENCOUNTER — Other Ambulatory Visit: Payer: Self-pay

## 2021-07-04 ENCOUNTER — Other Ambulatory Visit: Payer: Self-pay | Admitting: Gastroenterology

## 2021-07-04 DIAGNOSIS — K859 Acute pancreatitis without necrosis or infection, unspecified: Secondary | ICD-10-CM

## 2021-07-04 DIAGNOSIS — K769 Liver disease, unspecified: Secondary | ICD-10-CM | POA: Diagnosis not present

## 2021-07-04 DIAGNOSIS — IMO0002 Reserved for concepts with insufficient information to code with codable children: Secondary | ICD-10-CM

## 2021-07-04 DIAGNOSIS — R1013 Epigastric pain: Secondary | ICD-10-CM

## 2021-07-04 DIAGNOSIS — K8689 Other specified diseases of pancreas: Secondary | ICD-10-CM | POA: Diagnosis not present

## 2021-07-04 DIAGNOSIS — I700 Atherosclerosis of aorta: Secondary | ICD-10-CM | POA: Diagnosis not present

## 2021-07-04 DIAGNOSIS — K5730 Diverticulosis of large intestine without perforation or abscess without bleeding: Secondary | ICD-10-CM | POA: Diagnosis not present

## 2021-07-04 MED ORDER — IOHEXOL 300 MG/ML  SOLN: 100.0000 mL | Freq: Once | INTRAMUSCULAR | Status: DC | PRN

## 2021-07-04 MED ORDER — IOHEXOL 300 MG/ML  SOLN
100.0000 mL | Freq: Once | INTRAMUSCULAR | Status: AC | PRN
  Administered 2021-07-04: 100 mL via INTRAVENOUS

## 2021-07-05 ENCOUNTER — Other Ambulatory Visit: Payer: Self-pay

## 2021-07-16 MED FILL — Ondansetron HCl Tab 4 MG: ORAL | 6 days supply | Qty: 20 | Fill #1 | Status: AC

## 2021-07-17 ENCOUNTER — Other Ambulatory Visit: Payer: Self-pay

## 2021-07-19 ENCOUNTER — Other Ambulatory Visit: Payer: Self-pay

## 2021-07-26 ENCOUNTER — Other Ambulatory Visit: Payer: Self-pay

## 2021-07-26 DIAGNOSIS — K859 Acute pancreatitis without necrosis or infection, unspecified: Secondary | ICD-10-CM

## 2021-07-26 DIAGNOSIS — R1013 Epigastric pain: Secondary | ICD-10-CM

## 2021-07-26 DIAGNOSIS — F17200 Nicotine dependence, unspecified, uncomplicated: Secondary | ICD-10-CM

## 2021-07-26 DIAGNOSIS — K860 Alcohol-induced chronic pancreatitis: Secondary | ICD-10-CM

## 2021-07-26 DIAGNOSIS — R6881 Early satiety: Secondary | ICD-10-CM

## 2021-07-30 ENCOUNTER — Telehealth: Payer: Self-pay | Admitting: Gastroenterology

## 2021-07-30 NOTE — Telephone Encounter (Signed)
Spoke with patient, he states that he was told by Dr. [name] to discontinue Naproxen. Per 05/22/21 EUS report "Try to minimize NSAID use (Naproxen) as this is likely causing peptic ulcer disease at this time." Pt is aware that I will call in RX for Tramadol. Pt advised that this is a short course prescription so he will need to take as need to moderate to severe pain. [facility] on [facility]. Pt verbalized understanding and had no concerns at the end of the call.

## 2021-07-30 NOTE — Telephone Encounter (Signed)
Pt is asking if something can be prescribed for abd pain while he waits for his appt at [facility]. [facility] [address].

## 2021-07-30 NOTE — Telephone Encounter (Signed)
Is he using anything for pain currently?  If not, typically start with NSAIDs and/or Tylenol as needed for pain.  If these are ineffective, is then reasonable to trial escalating medication while awaiting appointment at [facility].    If he has already tried NSAIDs and Tylenol without improvement, would then be okay to go with tramadol IR 25 mg every 6 hours as needed for pain, #20, RF0.  If pain escalating and any associated p.o. intolerance, nausea/vomiting, fever, or other concerns, could warrant ER evaluation with labs and exam.

## 2021-07-30 NOTE — Telephone Encounter (Signed)
Spoke with patient, he states that he has upper right sided abdominal pain, describes as a sharp pain. Pain radiates to back at times. No OTC meds for pain. Patient rates pain at 8/10. No fever. He is wanting a prescription to help with the pain until his appt with [facility]. The referral was sent as urgent last Friday. Please advise, thanks.

## 2021-07-31 MED ORDER — TRAMADOL HCL 50 MG PO TABS: 50.0000 mg | ORAL_TABLET | Freq: Four times a day (QID) | ORAL | 0 refills | Status: DC | PRN

## 2021-07-31 NOTE — Telephone Encounter (Signed)
Spoke with [facility] pharmacist, Nayef, I have called in Tramadol prescription.

## 2021-08-01 ENCOUNTER — Other Ambulatory Visit: Payer: Self-pay

## 2021-08-01 MED FILL — Ondansetron HCl Tab 4 MG: ORAL | 6 days supply | Qty: 20 | Fill #2 | Status: AC

## 2021-08-02 ENCOUNTER — Other Ambulatory Visit: Payer: Self-pay

## 2021-08-02 DIAGNOSIS — R1013 Epigastric pain: Secondary | ICD-10-CM | POA: Diagnosis not present

## 2021-08-02 DIAGNOSIS — M545 Low back pain, unspecified: Secondary | ICD-10-CM | POA: Diagnosis not present

## 2021-08-02 DIAGNOSIS — Z79899 Other long term (current) drug therapy: Secondary | ICD-10-CM | POA: Diagnosis not present

## 2021-08-02 DIAGNOSIS — G8929 Other chronic pain: Secondary | ICD-10-CM | POA: Diagnosis not present

## 2021-08-07 DIAGNOSIS — Z79899 Other long term (current) drug therapy: Secondary | ICD-10-CM | POA: Diagnosis not present

## 2021-08-15 DIAGNOSIS — H524 Presbyopia: Secondary | ICD-10-CM | POA: Diagnosis not present

## 2021-08-26 ENCOUNTER — Telehealth: Payer: Self-pay

## 2021-08-26 NOTE — Telephone Encounter (Signed)
-----  Message from Yevette Edwards, RN sent at 07/26/2021  4:41 PM EDT ----- Regarding: Labs Hepatic function panel, CA 19-9 - chronic pancreatitis. Orders in epic.

## 2021-08-26 NOTE — Telephone Encounter (Signed)
My chart message sent with lab reminder.

## 2021-08-28 ENCOUNTER — Other Ambulatory Visit: Payer: Medicare Other

## 2021-08-28 DIAGNOSIS — K859 Acute pancreatitis without necrosis or infection, unspecified: Secondary | ICD-10-CM

## 2021-08-28 DIAGNOSIS — F17200 Nicotine dependence, unspecified, uncomplicated: Secondary | ICD-10-CM | POA: Diagnosis not present

## 2021-08-28 DIAGNOSIS — R6881 Early satiety: Secondary | ICD-10-CM

## 2021-08-28 DIAGNOSIS — K860 Alcohol-induced chronic pancreatitis: Secondary | ICD-10-CM

## 2021-08-28 DIAGNOSIS — R1013 Epigastric pain: Secondary | ICD-10-CM

## 2021-08-28 DIAGNOSIS — Z8719 Personal history of other diseases of the digestive system: Secondary | ICD-10-CM | POA: Diagnosis not present

## 2021-08-28 LAB — HEPATIC FUNCTION PANEL
ALT: 18 U/L (ref 0–53)
AST: 30 U/L (ref 0–37)
Albumin: 4.3 g/dL (ref 3.5–5.2)
Alkaline Phosphatase: 72 U/L (ref 39–117)
Bilirubin, Direct: 0.1 mg/dL (ref 0.0–0.3)
Total Bilirubin: 0.5 mg/dL (ref 0.2–1.2)
Total Protein: 8 g/dL (ref 6.0–8.3)

## 2021-08-29 LAB — CANCER ANTIGEN 19-9: CA 19-9: 34 U/mL — ABNORMAL HIGH

## 2021-08-30 DIAGNOSIS — Z79899 Other long term (current) drug therapy: Secondary | ICD-10-CM | POA: Diagnosis not present

## 2021-08-30 DIAGNOSIS — R1013 Epigastric pain: Secondary | ICD-10-CM | POA: Diagnosis not present

## 2021-08-30 DIAGNOSIS — G8929 Other chronic pain: Secondary | ICD-10-CM | POA: Diagnosis not present

## 2021-08-30 DIAGNOSIS — M545 Low back pain, unspecified: Secondary | ICD-10-CM | POA: Diagnosis not present

## 2021-09-04 DIAGNOSIS — Z79899 Other long term (current) drug therapy: Secondary | ICD-10-CM | POA: Diagnosis not present

## 2021-09-05 ENCOUNTER — Other Ambulatory Visit: Payer: Self-pay

## 2021-09-06 ENCOUNTER — Telehealth: Payer: Self-pay | Admitting: General Surgery

## 2021-09-06 NOTE — Telephone Encounter (Signed)
Left detailed message regarding results of liver enzymes and CA19-9. Told the patient I would send a mychart message and If he could to reply to it or call me.

## 2021-09-06 NOTE — Telephone Encounter (Signed)
-----  Message from Stigler, DO sent at 09/05/2021  2:54 PM EDT ----- Liver enzymes have normalized, including normal total bilirubin is 0.5.  CA 19-9 at the upper limit of normal at 34.  This has fluctuated in the past, most recently 38, but 38 before that.  Looks like he has an appointment set up with [facility] on 09/20/2021.  Please confirm that he has that appointment.  All of these labs should be visible through care everywhere when he is seen at [facility], but we can certainly assist if records are needed.

## 2021-09-09 ENCOUNTER — Other Ambulatory Visit: Payer: Self-pay

## 2021-09-10 ENCOUNTER — Other Ambulatory Visit: Payer: Self-pay

## 2021-09-17 ENCOUNTER — Other Ambulatory Visit: Payer: Self-pay

## 2021-09-17 ENCOUNTER — Encounter: Payer: Self-pay | Admitting: Critical Care Medicine

## 2021-09-17 ENCOUNTER — Ambulatory Visit: Payer: Medicare Other | Attending: Critical Care Medicine | Admitting: Critical Care Medicine

## 2021-09-17 DIAGNOSIS — E538 Deficiency of other specified B group vitamins: Secondary | ICD-10-CM | POA: Diagnosis not present

## 2021-09-17 DIAGNOSIS — F17210 Nicotine dependence, cigarettes, uncomplicated: Secondary | ICD-10-CM | POA: Diagnosis not present

## 2021-09-17 DIAGNOSIS — K861 Other chronic pancreatitis: Secondary | ICD-10-CM | POA: Diagnosis not present

## 2021-09-17 DIAGNOSIS — M48062 Spinal stenosis, lumbar region with neurogenic claudication: Secondary | ICD-10-CM | POA: Diagnosis not present

## 2021-09-17 DIAGNOSIS — G629 Polyneuropathy, unspecified: Secondary | ICD-10-CM | POA: Diagnosis not present

## 2021-09-17 DIAGNOSIS — Z8719 Personal history of other diseases of the digestive system: Secondary | ICD-10-CM

## 2021-09-17 DIAGNOSIS — E440 Moderate protein-calorie malnutrition: Secondary | ICD-10-CM

## 2021-09-17 DIAGNOSIS — G894 Chronic pain syndrome: Secondary | ICD-10-CM

## 2021-09-17 DIAGNOSIS — R351 Nocturia: Secondary | ICD-10-CM

## 2021-09-17 DIAGNOSIS — G2581 Restless legs syndrome: Secondary | ICD-10-CM

## 2021-09-17 DIAGNOSIS — R110 Nausea: Secondary | ICD-10-CM

## 2021-09-17 DIAGNOSIS — M792 Neuralgia and neuritis, unspecified: Secondary | ICD-10-CM | POA: Diagnosis not present

## 2021-09-17 DIAGNOSIS — N401 Enlarged prostate with lower urinary tract symptoms: Secondary | ICD-10-CM

## 2021-09-17 DIAGNOSIS — F17200 Nicotine dependence, unspecified, uncomplicated: Secondary | ICD-10-CM

## 2021-09-17 DIAGNOSIS — J449 Chronic obstructive pulmonary disease, unspecified: Secondary | ICD-10-CM

## 2021-09-17 DIAGNOSIS — G621 Alcoholic polyneuropathy: Secondary | ICD-10-CM

## 2021-09-17 DIAGNOSIS — F0631 Mood disorder due to known physiological condition with depressive features: Secondary | ICD-10-CM

## 2021-09-17 DIAGNOSIS — B182 Chronic viral hepatitis C: Secondary | ICD-10-CM

## 2021-09-17 MED ORDER — BUDESONIDE-FORMOTEROL FUMARATE 160-4.5 MCG/ACT IN AERO
2.0000 | INHALATION_SPRAY | Freq: Two times a day (BID) | RESPIRATORY_TRACT | 12 refills | Status: DC
  Filled 2021-09-17 – 2022-02-05 (×2): qty 10.2, 30d supply, fill #0
  Filled 2022-04-09 – 2022-04-18 (×2): qty 10.2, 30d supply, fill #1
  Filled 2022-05-27: qty 10.2, 30d supply, fill #2

## 2021-09-17 MED ORDER — VITAMIN B-12 1000 MCG PO TABS
1000.0000 ug | ORAL_TABLET | Freq: Every day | ORAL | 4 refills | Status: DC
Start: 2021-09-17 — End: 2021-11-05
  Filled 2021-09-17: qty 60, 60d supply, fill #0

## 2021-09-17 MED ORDER — DULOXETINE HCL 60 MG PO CPEP
60.0000 mg | ORAL_CAPSULE | Freq: Every day | ORAL | 3 refills | Status: DC
Start: 2021-09-17 — End: 2021-11-05
  Filled 2021-09-17: qty 30, 30d supply, fill #0

## 2021-09-17 MED ORDER — NICOTINE POLACRILEX 4 MG MT LOZG
LOZENGE | OROMUCOSAL | 4 refills | Status: DC
Start: 2021-09-17 — End: 2022-08-14
  Filled 2021-09-17: qty 72, 24d supply, fill #0

## 2021-09-17 MED ORDER — ONDANSETRON HCL 4 MG PO TABS
4.0000 mg | ORAL_TABLET | Freq: Three times a day (TID) | ORAL | 1 refills | Status: DC | PRN
  Filled 2021-09-17: qty 20, 7d supply, fill #0

## 2021-09-17 MED ORDER — THIAMINE HCL 100 MG PO TABS
ORAL_TABLET | Freq: Every day | ORAL | 6 refills | Status: DC
  Filled 2021-09-17: qty 30, fill #0

## 2021-09-17 MED ORDER — FOLIC ACID 1 MG PO TABS
1.0000 mg | ORAL_TABLET | Freq: Every day | ORAL | 4 refills | Status: DC
  Filled 2021-09-17: qty 30, 30d supply, fill #0

## 2021-09-17 MED ORDER — ROPINIROLE HCL 1 MG PO TABS
1.0000 mg | ORAL_TABLET | Freq: Every evening | ORAL | 1 refills | Status: DC | PRN
  Filled 2021-09-17: qty 30, 30d supply, fill #0

## 2021-09-17 MED ORDER — PREGABALIN 150 MG PO CAPS
150.0000 mg | ORAL_CAPSULE | Freq: Every day | ORAL | 6 refills | Status: DC
  Filled 2021-09-17 – 2021-11-04 (×2): qty 30, 30d supply, fill #0

## 2021-09-17 MED ORDER — ALBUTEROL SULFATE HFA 108 (90 BASE) MCG/ACT IN AERS
2.0000 | INHALATION_SPRAY | Freq: Four times a day (QID) | RESPIRATORY_TRACT | 0 refills | Status: DC | PRN
  Filled 2021-09-17: qty 8.5, 25d supply, fill #0

## 2021-09-17 NOTE — Assessment & Plan Note (Signed)
Patient currently not using tamsulosin will observe off medication
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
## 2021-09-17 NOTE — Assessment & Plan Note (Signed)
COPD advanced with ongoing tobacco use and not using his inhaler on a regular basis  Patient reinstructed as to proper use of Symbicort and is to resume it at 2 puffs twice daily refill sent  See smoking use assessment

## 2021-09-17 NOTE — Assessment & Plan Note (Signed)
History of acute pancreatitis now with chronic pancreatitis with head of pancreatic organ inflamed  In the past thought to be due to alcohol but he has been abstinent since 2019 from alcohol  Patient has narrowing at the common bile duct entrance to the pancreatic duct area.  Patient is to be referred to [facility] for further investigations as endoscopic ultrasound has not yielded a diagnosis and repeat imaging not showing a diagnosis as well but concerned over pancreatic cancer is raised  Patient is got pancreatic insufficiency and has not been using the [name] as prescribed  Patient advised to resume [name] as prescribed  Patient is in a chronic pain management program and he will continue to follow in that clinic for his opiates  Smoking cessation advised that this is contributing to his pancreatic condition as he continues to smoke

## 2021-09-17 NOTE — Assessment & Plan Note (Signed)
Patient is status post therapy for this currently off antiviral

## 2021-09-17 NOTE — Assessment & Plan Note (Signed)
Marland Kitchen  Current smoking consumption amount: 1 pack a Scharf  . Dicsussion on advise to quit smoking and smoking impacts: Impacts on lung health and pancreatic health  . Patient's willingness to quit: Uncertain  . Methods to quit smoking discussed: Behavioral modification nicotine replacement  . Medication management of smoking session drugs discussed: Advised to pick up nicotine patch and lozenge takes at the same time  . Resources provided:  AVS   . Setting quit date not established  . Follow-up arranged 1 months   Time spent counseling the patient: 5 minutes

## 2021-09-17 NOTE — Assessment & Plan Note (Signed)
Recent albumin normal patient advised to continue [name] supplement

## 2021-09-17 NOTE — Assessment & Plan Note (Signed)
Patient is currently abstinent from alcohol he will continue folic acid and thiamine supplementation and duloxetine these were refilled

## 2021-09-17 NOTE — Assessment & Plan Note (Signed)
Chronic pain due to back as well as now chronic pancreatitis patient in pain clinic will continue follow-up

## 2021-09-18 ENCOUNTER — Other Ambulatory Visit: Payer: Self-pay

## 2021-09-19 ENCOUNTER — Other Ambulatory Visit: Payer: Self-pay

## 2021-09-20 DIAGNOSIS — M5416 Radiculopathy, lumbar region: Secondary | ICD-10-CM | POA: Diagnosis not present

## 2021-09-20 DIAGNOSIS — F17210 Nicotine dependence, cigarettes, uncomplicated: Secondary | ICD-10-CM | POA: Diagnosis not present

## 2021-09-20 DIAGNOSIS — Z9049 Acquired absence of other specified parts of digestive tract: Secondary | ICD-10-CM | POA: Diagnosis not present

## 2021-09-20 DIAGNOSIS — K5909 Other constipation: Secondary | ICD-10-CM | POA: Diagnosis not present

## 2021-09-20 DIAGNOSIS — R109 Unspecified abdominal pain: Secondary | ICD-10-CM | POA: Diagnosis not present

## 2021-09-20 DIAGNOSIS — K8689 Other specified diseases of pancreas: Secondary | ICD-10-CM | POA: Diagnosis not present

## 2021-09-20 DIAGNOSIS — J449 Chronic obstructive pulmonary disease, unspecified: Secondary | ICD-10-CM | POA: Diagnosis not present

## 2021-09-20 DIAGNOSIS — K861 Other chronic pancreatitis: Secondary | ICD-10-CM | POA: Diagnosis not present

## 2021-09-27 DIAGNOSIS — M545 Low back pain, unspecified: Secondary | ICD-10-CM | POA: Diagnosis not present

## 2021-09-27 DIAGNOSIS — G8929 Other chronic pain: Secondary | ICD-10-CM | POA: Diagnosis not present

## 2021-09-27 DIAGNOSIS — Z79899 Other long term (current) drug therapy: Secondary | ICD-10-CM | POA: Diagnosis not present

## 2021-09-27 DIAGNOSIS — R1013 Epigastric pain: Secondary | ICD-10-CM | POA: Diagnosis not present

## 2021-10-02 DIAGNOSIS — K209 Esophagitis, unspecified without bleeding: Secondary | ICD-10-CM | POA: Diagnosis not present

## 2021-10-02 DIAGNOSIS — Z79899 Other long term (current) drug therapy: Secondary | ICD-10-CM | POA: Diagnosis not present

## 2021-10-02 DIAGNOSIS — K869 Disease of pancreas, unspecified: Secondary | ICD-10-CM | POA: Diagnosis not present

## 2021-10-02 DIAGNOSIS — K8689 Other specified diseases of pancreas: Secondary | ICD-10-CM | POA: Diagnosis not present

## 2021-10-02 DIAGNOSIS — R935 Abnormal findings on diagnostic imaging of other abdominal regions, including retroperitoneum: Secondary | ICD-10-CM | POA: Diagnosis not present

## 2021-10-02 DIAGNOSIS — K838 Other specified diseases of biliary tract: Secondary | ICD-10-CM | POA: Diagnosis not present

## 2021-10-02 DIAGNOSIS — K861 Other chronic pancreatitis: Secondary | ICD-10-CM | POA: Diagnosis not present

## 2021-10-02 DIAGNOSIS — Z7951 Long term (current) use of inhaled steroids: Secondary | ICD-10-CM | POA: Diagnosis not present

## 2021-10-02 DIAGNOSIS — R1013 Epigastric pain: Secondary | ICD-10-CM | POA: Diagnosis not present

## 2021-10-02 DIAGNOSIS — F17210 Nicotine dependence, cigarettes, uncomplicated: Secondary | ICD-10-CM | POA: Diagnosis not present

## 2021-10-02 DIAGNOSIS — J449 Chronic obstructive pulmonary disease, unspecified: Secondary | ICD-10-CM | POA: Diagnosis not present

## 2021-10-02 DIAGNOSIS — K2289 Other specified disease of esophagus: Secondary | ICD-10-CM | POA: Diagnosis not present

## 2021-10-02 DIAGNOSIS — Z9049 Acquired absence of other specified parts of digestive tract: Secondary | ICD-10-CM | POA: Diagnosis not present

## 2021-10-02 DIAGNOSIS — G629 Polyneuropathy, unspecified: Secondary | ICD-10-CM | POA: Diagnosis not present

## 2021-10-16 ENCOUNTER — Other Ambulatory Visit: Payer: Self-pay

## 2021-10-28 ENCOUNTER — Telehealth: Payer: Self-pay

## 2021-10-28 DIAGNOSIS — K860 Alcohol-induced chronic pancreatitis: Secondary | ICD-10-CM

## 2021-10-28 DIAGNOSIS — R948 Abnormal results of function studies of other organs and systems: Secondary | ICD-10-CM

## 2021-10-28 DIAGNOSIS — K859 Acute pancreatitis without necrosis or infection, unspecified: Secondary | ICD-10-CM

## 2021-10-28 NOTE — Telephone Encounter (Signed)
-----  Message from April H Pait sent at 10/28/2021  4:01 PM EST ----- Regarding: FW: CT appt  ----- Message ----- From: Antonieta Loveless Sent: 10/28/2021   3:19 PM EST To: April H Pait Subject: RE: CT appt                                    Appointment has not been authorized yet. As soon as it is, then we will schedule patient.    ----- Message ----- From: Sheldon Silvan, April H Sent: 10/28/2021   1:28 PM EST To: Antonieta Loveless Subject: FW: CT appt                                     ----- Message ----- From: Yevette Edwards, RN Sent: 10/28/2021   1:03 PM EST To: April H Pait, Roosvelt Maser Subject: CT appt                                        Please schedule pt for CT scan at [facility]. The order is in epic thanks!

## 2021-10-28 NOTE — Telephone Encounter (Signed)
Left message on voicemail for patient to return call.  CT order in epic

## 2021-10-28 NOTE — Telephone Encounter (Signed)
-----  Message from Yevette Edwards, RN sent at 07/26/2021  4:42 PM EDT ----- Regarding: CT CT pancreas protocol - chronic pancreatitis

## 2021-10-28 NOTE — Telephone Encounter (Signed)
Called and spoke to patient. He's aware that he's due for a routine CT at this time and knows to expect a call from radiology scheduling. Pt reports that [facility] surgeon told him that he needs a colonoscopy at this time. Pt said he is having right upper abdominal pain that moves down to lower abdomen. Please advise if pt needs office visit prior to scheduling colon.   Secure staff message sent to radiology scheduling to contact pt.

## 2021-10-28 NOTE — Telephone Encounter (Signed)
Called and spoke with patient. He has been scheduled for a pre-visit on Wednesday, 12/11/21 at 1:30 pm. Pt is aware that he will check in on the 2nd floor of the [place] office. Pt is scheduled for a colonoscopy on Tuesday, 12/24/21 at 8 am. Pt is aware that he will need to arrive on the 4th floor in the [facility] with a care partner by 7 am. Pt verbalized understanding of all information and had no concerns at the end of the call.

## 2021-10-28 NOTE — Telephone Encounter (Signed)
Would be due in a couple months for repeat colonoscopy for polyp surveillance anyway, but given symptomatology and recommendation to expedite from [place], ok to schedule for colonoscopy w me w/o need for OV first. Thanks.

## 2021-10-30 DIAGNOSIS — M545 Low back pain, unspecified: Secondary | ICD-10-CM | POA: Diagnosis not present

## 2021-10-30 DIAGNOSIS — G8929 Other chronic pain: Secondary | ICD-10-CM | POA: Diagnosis not present

## 2021-10-30 DIAGNOSIS — Z79899 Other long term (current) drug therapy: Secondary | ICD-10-CM | POA: Diagnosis not present

## 2021-10-30 DIAGNOSIS — R1013 Epigastric pain: Secondary | ICD-10-CM | POA: Diagnosis not present

## 2021-11-04 ENCOUNTER — Other Ambulatory Visit: Payer: Self-pay

## 2021-11-05 ENCOUNTER — Other Ambulatory Visit: Payer: Self-pay

## 2021-11-05 ENCOUNTER — Ambulatory Visit: Payer: Medicare Other | Attending: Critical Care Medicine | Admitting: Critical Care Medicine

## 2021-11-05 ENCOUNTER — Encounter: Payer: Self-pay | Admitting: Critical Care Medicine

## 2021-11-05 VITALS — BP 111/75 | HR 90 | Resp 16 | Wt 155.0 lb

## 2021-11-05 DIAGNOSIS — J449 Chronic obstructive pulmonary disease, unspecified: Secondary | ICD-10-CM

## 2021-11-05 DIAGNOSIS — M48062 Spinal stenosis, lumbar region with neurogenic claudication: Secondary | ICD-10-CM

## 2021-11-05 DIAGNOSIS — R110 Nausea: Secondary | ICD-10-CM

## 2021-11-05 DIAGNOSIS — E440 Moderate protein-calorie malnutrition: Secondary | ICD-10-CM

## 2021-11-05 DIAGNOSIS — F17200 Nicotine dependence, unspecified, uncomplicated: Secondary | ICD-10-CM

## 2021-11-05 DIAGNOSIS — Z8719 Personal history of other diseases of the digestive system: Secondary | ICD-10-CM

## 2021-11-05 DIAGNOSIS — K861 Other chronic pancreatitis: Secondary | ICD-10-CM

## 2021-11-05 DIAGNOSIS — Z23 Encounter for immunization: Secondary | ICD-10-CM

## 2021-11-05 DIAGNOSIS — G894 Chronic pain syndrome: Secondary | ICD-10-CM

## 2021-11-05 DIAGNOSIS — N521 Erectile dysfunction due to diseases classified elsewhere: Secondary | ICD-10-CM

## 2021-11-05 DIAGNOSIS — E538 Deficiency of other specified B group vitamins: Secondary | ICD-10-CM | POA: Diagnosis not present

## 2021-11-05 DIAGNOSIS — N529 Male erectile dysfunction, unspecified: Secondary | ICD-10-CM | POA: Insufficient documentation

## 2021-11-05 DIAGNOSIS — M792 Neuralgia and neuritis, unspecified: Secondary | ICD-10-CM | POA: Diagnosis not present

## 2021-11-05 DIAGNOSIS — I739 Peripheral vascular disease, unspecified: Secondary | ICD-10-CM

## 2021-11-05 DIAGNOSIS — G621 Alcoholic polyneuropathy: Secondary | ICD-10-CM

## 2021-11-05 DIAGNOSIS — J4489 Other specified chronic obstructive pulmonary disease: Secondary | ICD-10-CM

## 2021-11-05 MED ORDER — PREGABALIN 150 MG PO CAPS
150.0000 mg | ORAL_CAPSULE | Freq: Three times a day (TID) | ORAL | 6 refills | Status: DC
  Filled 2021-11-05: qty 90, 30d supply, fill #0
  Filled 2022-01-13: qty 90, 30d supply, fill #1
  Filled 2022-01-14: qty 90, 30d supply, fill #0

## 2021-11-05 MED ORDER — PANTOPRAZOLE SODIUM 40 MG PO TBEC
40.0000 mg | DELAYED_RELEASE_TABLET | Freq: Two times a day (BID) | ORAL | 6 refills | Status: DC
  Filled 2021-11-05: qty 60, 30d supply, fill #0

## 2021-11-05 MED ORDER — FOLIC ACID 1 MG PO TABS
1.0000 mg | ORAL_TABLET | Freq: Every day | ORAL | 4 refills | Status: DC
  Filled 2021-11-05: qty 30, 30d supply, fill #0

## 2021-11-05 MED ORDER — THIAMINE HCL 100 MG PO TABS
ORAL_TABLET | Freq: Every day | ORAL | 6 refills | Status: DC
  Filled 2021-11-05: qty 30, fill #0

## 2021-11-05 MED ORDER — ONDANSETRON HCL 4 MG PO TABS
4.0000 mg | ORAL_TABLET | Freq: Three times a day (TID) | ORAL | 1 refills | Status: DC | PRN
  Filled 2021-11-05: qty 20, 7d supply, fill #0
  Filled 2022-01-13: qty 20, 7d supply, fill #1
  Filled 2022-01-14: qty 20, 7d supply, fill #0

## 2021-11-05 MED ORDER — NORTRIPTYLINE HCL 10 MG PO CAPS
10.0000 mg | ORAL_CAPSULE | Freq: Every day | ORAL | 4 refills | Status: DC
  Filled 2021-11-05: qty 60, 60d supply, fill #0

## 2021-11-05 MED ORDER — VITAMIN B-12 1000 MCG PO TABS
1000.0000 ug | ORAL_TABLET | Freq: Every day | ORAL | 4 refills | Status: DC
  Filled 2021-11-05: qty 60, 60d supply, fill #0

## 2021-11-05 MED ORDER — PANCRELIPASE (LIP-PROT-AMYL) 36000-114000 UNITS PO CPEP
ORAL_CAPSULE | ORAL | 1 refills | Status: DC
  Filled 2021-11-05: qty 200, 25d supply, fill #0

## 2021-11-05 MED ORDER — ALBUTEROL SULFATE HFA 108 (90 BASE) MCG/ACT IN AERS
2.0000 | INHALATION_SPRAY | Freq: Four times a day (QID) | RESPIRATORY_TRACT | 0 refills | Status: DC | PRN
  Filled 2021-11-05: qty 8.5, 25d supply, fill #0

## 2021-11-05 MED ORDER — TAMSULOSIN HCL 0.4 MG PO CAPS
0.4000 mg | ORAL_CAPSULE | Freq: Every day | ORAL | 5 refills | Status: DC
  Filled 2021-11-05: qty 30, 30d supply, fill #0

## 2021-11-05 MED ORDER — SILDENAFIL CITRATE 50 MG PO TABS
50.0000 mg | ORAL_TABLET | Freq: Every day | ORAL | 0 refills | Status: DC | PRN
  Filled 2021-11-05: qty 5, 30d supply, fill #0
  Filled 2021-11-12: qty 10, 30d supply, fill #0

## 2021-11-05 MED ORDER — BACLOFEN 10 MG PO TABS
5.0000 mg | ORAL_TABLET | Freq: Three times a day (TID) | ORAL | 4 refills | Status: DC | PRN
  Filled 2021-11-05: qty 60, 20d supply, fill #0

## 2021-11-05 MED ORDER — ERGOCALCIFEROL 1.25 MG (50000 UT) PO CAPS
50000.0000 [IU] | ORAL_CAPSULE | ORAL | 3 refills | Status: AC
Start: 2021-11-05 — End: 2021-12-05
  Filled 2021-11-05: qty 12, 84d supply, fill #0

## 2021-11-05 NOTE — Assessment & Plan Note (Signed)
Peripheral artery disease stable at this time

## 2021-11-05 NOTE — Assessment & Plan Note (Addendum)
Marland Kitchen  Current smoking consumption amount: 1/2 pack a [place]k  . Dicsussion on advise to quit smoking and smoking impacts: Impacts on lung health and pancreatic health  . Patient's willingness to quit: Uncertain  . Methods to quit smoking discussed: Behavioral modification nicotine replacement  . Medication management of smoking session drugs discussed: Advised to pick up nicotine patch and lozenge takes at the same time  . Resources provided:  AVS   . Setting quit date not established  . Follow-up arranged 4 months   Time spent counseling the patient: 5 minutes

## 2021-11-05 NOTE — Assessment & Plan Note (Signed)
As per gastroenterology continue [name]

## 2021-11-05 NOTE — Assessment & Plan Note (Addendum)
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

## 2021-11-05 NOTE — Assessment & Plan Note (Signed)
Continue nutrition supplements and continue [name]

## 2021-11-05 NOTE — Assessment & Plan Note (Signed)
Chronic lung disease stable at this time but he is still smoking half pack a [place] of cigarettes  Patient advised to reduce smoking by use of nicotine replacement as before  Stay on Symbicort twice daily albuterol as needed
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
## 2021-11-05 NOTE — Patient Instructions (Signed)
Flu vaccine and pneumonia vaccine was given  Refills on your medication sent to our pharmacy  Discontinue duloxetine  Begin Viagra take as needed for erectile dysfunction if this does not improve your performance we will then refer you to urology  Keep your follow-up appointments with [facility]  Focus on smoking cessation by using nicotine replacement therapy to reduce the amount of tobacco you are taking in  Return to see Dr. Joya Gaskins in 4 months

## 2021-11-05 NOTE — Assessment & Plan Note (Signed)
Patient taking opiates as needed only 1-2 daily continue with pain management clinic

## 2021-11-05 NOTE — Telephone Encounter (Signed)
[place], Amy Cecilio Asper, [place], [place] Cc: Pait, April H This patient has [facility] Medicare.  No authorization required.  He can be scheduled.  Thanks,  Amy

## 2021-11-05 NOTE — Telephone Encounter (Signed)
Secure staff message sent to [facility] to follow up on this authorization.

## 2021-11-05 NOTE — Assessment & Plan Note (Signed)
Significant alcohol peripheral neuropathy continue thiamine folic acid supplementation continue Lyrica 150 mg 3 times daily and will discontinue duloxetine for now

## 2021-11-06 ENCOUNTER — Other Ambulatory Visit: Payer: Self-pay

## 2021-11-07 ENCOUNTER — Other Ambulatory Visit: Payer: Self-pay

## 2021-11-12 ENCOUNTER — Other Ambulatory Visit: Payer: Self-pay

## 2021-11-13 ENCOUNTER — Other Ambulatory Visit: Payer: Self-pay | Admitting: Gastroenterology

## 2021-11-13 DIAGNOSIS — R110 Nausea: Secondary | ICD-10-CM

## 2021-11-15 ENCOUNTER — Encounter (HOSPITAL_BASED_OUTPATIENT_CLINIC_OR_DEPARTMENT_OTHER): Payer: Self-pay

## 2021-11-15 ENCOUNTER — Ambulatory Visit (HOSPITAL_BASED_OUTPATIENT_CLINIC_OR_DEPARTMENT_OTHER)
Admission: RE | Admit: 2021-11-15 | Discharge: 2021-11-15 | Disposition: A | Discharge status: Home / Self Care | Payer: Medicare Other | Source: Ambulatory Visit | Attending: Gastroenterology | Admitting: Gastroenterology

## 2021-11-15 ENCOUNTER — Other Ambulatory Visit: Payer: Self-pay

## 2021-11-15 DIAGNOSIS — R948 Abnormal results of function studies of other organs and systems: Secondary | ICD-10-CM | POA: Insufficient documentation

## 2021-11-15 DIAGNOSIS — K859 Acute pancreatitis without necrosis or infection, unspecified: Secondary | ICD-10-CM | POA: Diagnosis not present

## 2021-11-15 DIAGNOSIS — K8689 Other specified diseases of pancreas: Secondary | ICD-10-CM | POA: Diagnosis not present

## 2021-11-15 DIAGNOSIS — K860 Alcohol-induced chronic pancreatitis: Secondary | ICD-10-CM | POA: Insufficient documentation

## 2021-11-15 DIAGNOSIS — K4090 Unilateral inguinal hernia, without obstruction or gangrene, not specified as recurrent: Secondary | ICD-10-CM | POA: Diagnosis not present

## 2021-11-15 DIAGNOSIS — K7689 Other specified diseases of liver: Secondary | ICD-10-CM | POA: Diagnosis not present

## 2021-11-15 MED ORDER — IOHEXOL 300 MG/ML  SOLN
100.0000 mL | Freq: Once | INTRAMUSCULAR | Status: AC | PRN
  Administered 2021-11-15: 100 mL via INTRAVENOUS

## 2021-11-29 DIAGNOSIS — G8929 Other chronic pain: Secondary | ICD-10-CM | POA: Diagnosis not present

## 2021-11-29 DIAGNOSIS — M545 Low back pain, unspecified: Secondary | ICD-10-CM | POA: Diagnosis not present

## 2021-11-29 DIAGNOSIS — Z79899 Other long term (current) drug therapy: Secondary | ICD-10-CM | POA: Diagnosis not present

## 2021-11-29 DIAGNOSIS — R1013 Epigastric pain: Secondary | ICD-10-CM | POA: Diagnosis not present

## 2021-12-03 ENCOUNTER — Telehealth: Payer: Self-pay | Admitting: General Surgery

## 2021-12-03 NOTE — Telephone Encounter (Signed)
Contacted the patient and left a detailed message regarding his imaging. Notified him there were no changes from previous imaging studies. Patient advised to follow up with Dr Cleda Mccreedy at [facility]. Also advised to call with any questions.

## 2021-12-03 NOTE — Telephone Encounter (Signed)
-----  Message from [place], DO sent at 11/29/2021  2:43 PM EST ----- CT demonstrates 15 mm right hepatic cyst and mild intrahepatic duct dilation up to 11 mm with smooth tapering at the ampulla.  Again an area of hypoenhancement is seen in the pancreas measuring 2.5 x 3.8 cm.  This appears unchanged from the previous imaging studies.  Patient recently had EUS in early November at [facility].  Per review of notes from [address], patient is to follow-up with Dr. Cleda Mccreedy at [facility].  These results should also be available through [place].

## 2021-12-11 ENCOUNTER — Ambulatory Visit (AMBULATORY_SURGERY_CENTER): Payer: Medicare Other | Admitting: *Deleted

## 2021-12-11 ENCOUNTER — Other Ambulatory Visit: Payer: Self-pay

## 2021-12-11 VITALS — Ht 68.0 in | Wt 150.0 lb

## 2021-12-11 DIAGNOSIS — Z86010 Personal history of colonic polyps: Secondary | ICD-10-CM

## 2021-12-11 MED ORDER — PLENVU 140 G PO SOLR: 1.0000 | Freq: Once | ORAL | 0 refills | Status: AC

## 2021-12-11 NOTE — Progress Notes (Signed)
Pt's previsit is done over the phone and all paperwork (prep instructions, blank consent form to just read over) sent to patient.  Pt's name and DOB verified at the beginning of the previsit.  Pt denies any difficulty with ambulating.    No trouble with anesthesia, denies being told they were difficult to intubate, or hx/fam hx of malignant hyperthermia per pt    No egg or soy allergy  No home oxygen use   No medications for weight loss taken  emmi information given  Pt has constipation issues- 2 Rosch Plenvu/Miralax prep given  Pt informed that we do not do prior authorizations for prep  Plenvu Medicare coupon sent with paperwork- pt instructed to activate coupon and take to pharmacy; understanding voiced

## 2021-12-18 ENCOUNTER — Encounter: Payer: Self-pay | Admitting: Gastroenterology

## 2021-12-19 ENCOUNTER — Telehealth: Payer: Self-pay | Admitting: Gastroenterology

## 2021-12-19 ENCOUNTER — Other Ambulatory Visit: Payer: Self-pay

## 2021-12-19 DIAGNOSIS — Z86010 Personal history of colonic polyps: Secondary | ICD-10-CM

## 2021-12-19 MED ORDER — PEG 3350-KCL-NA BICARB-NACL 420 G PO SOLR
4000.0000 mL | Freq: Once | ORAL | 0 refills | Status: DC
  Filled 2021-12-19: qty 4000, 1d supply, fill #0

## 2021-12-19 NOTE — Telephone Encounter (Signed)
Sent in Golytely prep to pt's pharmacy  New instructions to pt's my chart for 2 [place] Golytely  Called pt- we  went over 2 [place] Golytely instructions over the phone

## 2021-12-19 NOTE — Telephone Encounter (Signed)
Patient called states the Plenvu is not covered by insurance and the coupon is not accepted requested an alternative.

## 2021-12-20 MED ORDER — PEG 3350-KCL-NA BICARB-NACL 420 G PO SOLR: 4000.0000 mL | Freq: Once | ORAL | 0 refills | Status: AC

## 2021-12-20 NOTE — Addendum Note (Signed)
Addended by: Levonne Spiller on: 12/20/2021 01:17 PM   Modules accepted: Orders

## 2021-12-20 NOTE — Telephone Encounter (Signed)
Patient just called states the Golytely was sent to the wrong pharmacy please send to [facility] on [address] where the Plenvu was sent.

## 2021-12-20 NOTE — Telephone Encounter (Signed)
[facility]ol[facility]ely sent to [facility] per pt's request.

## 2021-12-24 ENCOUNTER — Encounter: Payer: Self-pay | Admitting: Gastroenterology

## 2021-12-24 ENCOUNTER — Ambulatory Visit (AMBULATORY_SURGERY_CENTER): Payer: Medicare Other | Admitting: Gastroenterology

## 2021-12-24 ENCOUNTER — Other Ambulatory Visit: Payer: Self-pay

## 2021-12-24 VITALS — BP 147/90 | HR 73 | Temp 97.9°F | Resp 15 | Ht 68.0 in | Wt 150.0 lb

## 2021-12-24 DIAGNOSIS — K5730 Diverticulosis of large intestine without perforation or abscess without bleeding: Secondary | ICD-10-CM

## 2021-12-24 DIAGNOSIS — K640 First degree hemorrhoids: Secondary | ICD-10-CM | POA: Diagnosis not present

## 2021-12-24 DIAGNOSIS — D122 Benign neoplasm of ascending colon: Secondary | ICD-10-CM | POA: Diagnosis not present

## 2021-12-24 DIAGNOSIS — Z86010 Personal history of colonic polyps: Secondary | ICD-10-CM

## 2021-12-24 DIAGNOSIS — K529 Noninfective gastroenteritis and colitis, unspecified: Secondary | ICD-10-CM | POA: Diagnosis not present

## 2021-12-24 DIAGNOSIS — K5289 Other specified noninfective gastroenteritis and colitis: Secondary | ICD-10-CM | POA: Diagnosis not present

## 2021-12-24 DIAGNOSIS — D125 Benign neoplasm of sigmoid colon: Secondary | ICD-10-CM | POA: Diagnosis not present

## 2021-12-24 DIAGNOSIS — K635 Polyp of colon: Secondary | ICD-10-CM | POA: Diagnosis not present

## 2021-12-24 MED ORDER — SODIUM CHLORIDE 0.9 % IV SOLN: 500.0000 mL | Freq: Once | INTRAVENOUS | Status: DC

## 2021-12-24 NOTE — Progress Notes (Signed)
Pt's states no medical or surgical changes since previsit or office visit.   Vitals CW

## 2021-12-24 NOTE — Op Note (Signed)
[place] Patient Name: Wesley [name] Procedure Date: 12/24/2021 7:04 AM MRN: [id number] Endoscopist: Gerrit Heck , MD Age: 57 Referring MD:  Date of Birth: 07-21-1966 Gender: Male Account #: [id number] Procedure:                Colonoscopy Indications:              Surveillance: Personal history of adenomatous                            polyps on last colonoscopy 3 years ago                           Last colonoscopy was 05/2019 and notable for 7                            polyps (TA x6), sigmoid diverticulosis, small                            internal hemorrhoids. Normal TI. Medicines:                Monitored Anesthesia Care Procedure:                Pre-Anesthesia Assessment:                           - Prior to the procedure, a History and Physical                            was performed, and patient medications and                            allergies were reviewed. The patient's tolerance of                            previous anesthesia was also reviewed. The risks                            and benefits of the procedure and the sedation                            options and risks were discussed with the patient.                            All questions were answered, and informed consent                            was obtained. Prior Anticoagulants: The patient has                            taken no previous anticoagulant or antiplatelet                            agents. ASA Grade Assessment: III - A patient with  severe systemic disease. After reviewing the risks                            and benefits, the patient was deemed in                            satisfactory condition to undergo the procedure.                           After obtaining informed consent, the colonoscope                            was passed under direct vision. Throughout the                            procedure, the patient's blood pressure, pulse, and                             oxygen saturations were monitored continuously. The                            Olympus Colonoscope #[id number] was introduced through                            the anus and advanced to the the cecum, identified                            by appendiceal orifice and ileocecal valve. The                            colonoscopy was performed without difficulty. The                            patient tolerated the procedure well. The quality                            of the bowel preparation was good. The ileocecal                            valve, appendiceal orifice, and rectum were                            photographed. Scope In: 8:01:27 AM Scope Out: 8:18:40 AM Scope Withdrawal Time: 0 hours 15 minutes 4 seconds  Total Procedure Duration: 0 hours 17 minutes 13 seconds  Findings:                 The perianal and digital rectal examinations were                            normal.                           A 8 mm polyp was found in the ascending colon. The  polyp was sessile. The polyp was removed with a                            cold snare. Resection and retrieval were complete.                            Estimated blood loss was minimal.                           An area of granular mucosa was found in the sigmoid                            colon. This was located 55 cm from the anal verge,                            and proximal to the field of sigmoid                            diverticulosis. There was a faint scarring                            appearance to the area. The edges were not well                            defined with white light and narrow band imaging.                            Biopsies were taken with a cold forceps for                            histology. Estimated blood loss was minimal.                           Multiple small and large-mouthed diverticula were                            found in the sigmoid colon.                            A 3 mm polyp was found in the sigmoid colon. The                            polyp was sessile and located on the edge of a                            small diverticulum. The polyp was removed with a                            cold snare. Resection and retrieval were complete.                            Estimated blood loss was minimal.  Non-bleeding internal hemorrhoids were found during                            retroflexion. The hemorrhoids were small. Complications:            No immediate complications. Estimated Blood Loss:     Estimated blood loss was minimal. Impression:               - One 8 mm polyp in the ascending colon, removed                            with a cold snare. Resected and retrieved.                           - Localized area of mildly granular appearing                            mucosa in the sigmoid colon. Biopsied.                           - Diverticulosis in the sigmoid colon.                           - One 3 mm polyp in the sigmoid colon, removed with                            a cold snare. Resected and retrieved.                           - Non-bleeding internal hemorrhoids. Recommendation:           - Patient has a contact number available for                            emergencies. The signs and symptoms of potential                            delayed complications were discussed with the                            patient. Return to normal activities tomorrow.                            Written discharge instructions were provided to the                            patient.                           - Resume previous diet.                           - Continue present medications.                           - Await pathology results.                           -  Repeat colonoscopy for surveillance based on                            pathology results.                           - Return to GI clinic  PRN. Gerrit Heck, MD 12/24/2021 8:26:00 AM

## 2021-12-24 NOTE — Progress Notes (Signed)
To pacu, VSS. Report to [email or web address]
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
## 2021-12-24 NOTE — Progress Notes (Signed)
Called to room to assist during endoscopic procedure.  Patient ID and intended procedure confirmed with present staff. Received instructions for my participation in the procedure from the performing physician.

## 2021-12-24 NOTE — Patient Instructions (Signed)
YOU HAD AN ENDOSCOPIC PROCEDURE TODAY AT [facility]:   Refer to the procedure report that was given to you for any specific questions about what was found during the examination.  If the procedure report does not answer your questions, please call your gastroenterologist to clarify.  If you requested that your care partner not be given the details of your procedure findings, then the procedure report has been included in a sealed envelope for you to review at your convenience later.  **Handouts given on polyps, diverticulosis and hemorrhoids**  YOU SHOULD EXPECT: Some feelings of bloating in the abdomen. Passage of more gas than usual.  Walking can help get rid of the air that was put into your GI tract during the procedure and reduce the bloating. If you had a lower endoscopy (such as a colonoscopy or flexible sigmoidoscopy) you may notice spotting of blood in your stool or on the toilet paper. If you underwent a bowel prep for your procedure, you may not have a normal bowel movement for a few days.  Please Note:  You might notice some irritation and congestion in your nose or some drainage.  This is from the oxygen used during your procedure.  There is no need for concern and it should clear up in a Ho[place] or so.  SYMPTOMS TO REPORT IMMEDIATELY:  Following lower endoscopy (colonoscopy or flexible sigmoidoscopy):  Excessive amounts of blood in the stool  Significant tenderness or worsening of abdominal pains  Swelling of the abdomen that is new, acute  Fever of 100F or higher  For urgent or emergent issues, a gastroenterologist can be reached at any hour by calling [phone]. Do not use MyChart messaging for urgent concerns.    DIET:  We do recommend a small meal at first, but then you may proceed to your regular diet.  Drink plenty of fluids but you should avoid alcoholic beverages for 24 hours.  ACTIVITY:  You should plan to take it easy for the rest of today and you  should NOT DRIVE or use heavy machinery until tomorrow (because of the sedation medicines used during the test).    FOLLOW UP: Our staff will call the number listed on your records 48-72 hours following your procedure to check on you and address any questions or concerns that you may have regarding the information given to you following your procedure. If we do not reach you, we will leave a message.  We will attempt to reach you two times.  During this call, we will ask if you have developed any symptoms of COVID 19. If you develop any symptoms (ie: fever, flu-like symptoms, shortness of breath, cough etc.) before then, please call [phone].  If you test positive for Covid 19 in the 2 weeks post procedure, please call and report this information to [place].    If any biopsies were taken you will be contacted by phone or by letter within the next 1-3 weeks.  Please call us at [phone] if you have not heard about the biopsies in 3 weeks.    SIGNATURES/CONFIDENTIALITY: You and/or your care partner have signed paperwork which will be entered into your electronic medical record.  These signatures attest to the fact that that the information above on your After Visit Summary has been reviewed and is understood.  Full responsibility of the confidentiality of this discharge information lies with you and/or your care-partner.

## 2021-12-25 ENCOUNTER — Other Ambulatory Visit: Payer: Self-pay

## 2021-12-26 ENCOUNTER — Telehealth: Payer: Self-pay

## 2021-12-26 DIAGNOSIS — M5441 Lumbago with sciatica, right side: Secondary | ICD-10-CM | POA: Diagnosis not present

## 2021-12-26 DIAGNOSIS — M5442 Lumbago with sciatica, left side: Secondary | ICD-10-CM | POA: Diagnosis not present

## 2021-12-26 DIAGNOSIS — K861 Other chronic pancreatitis: Secondary | ICD-10-CM | POA: Diagnosis not present

## 2021-12-26 DIAGNOSIS — G8929 Other chronic pain: Secondary | ICD-10-CM | POA: Diagnosis not present

## 2021-12-26 NOTE — Telephone Encounter (Signed)
Follow up Call-  Call back number 12/24/2021 05/19/2019  Post procedure Call Back phone  # [phone]  Permission to leave phone message Yes Yes  Some recent data might be hidden     Patient questions:  Do you have a fever, pain , or abdominal swelling? No. Pain Score  0 *  Have you tolerated food without any problems? Yes.    Have you been able to return to your normal activities? Yes.    Do you have any questions about your discharge instructions: Diet   No. Medications  No. Follow up visit  No.  Do you have questions or concerns about your Care? No.  Actions: * If pain score is 4 or above: No action needed, pain <4.  Have you developed a fever since your procedure? no  2.   Have you had an respiratory symptoms (SOB or cough) since your procedure? no  3.   Have you tested positive for COVID 19 since your procedure no  4.   Have you had any family members/close contacts diagnosed with the COVID 19 since your procedure?  no   If yes to any of these questions please route to Joylene John, RN and Joella Prince, RN

## 2021-12-27 DIAGNOSIS — M545 Low back pain, unspecified: Secondary | ICD-10-CM | POA: Diagnosis not present

## 2021-12-27 DIAGNOSIS — Z79899 Other long term (current) drug therapy: Secondary | ICD-10-CM | POA: Diagnosis not present

## 2021-12-27 DIAGNOSIS — R1013 Epigastric pain: Secondary | ICD-10-CM | POA: Diagnosis not present

## 2021-12-27 DIAGNOSIS — G8929 Other chronic pain: Secondary | ICD-10-CM | POA: Diagnosis not present

## 2022-01-02 DIAGNOSIS — Z79899 Other long term (current) drug therapy: Secondary | ICD-10-CM | POA: Diagnosis not present

## 2022-01-06 ENCOUNTER — Encounter: Payer: Self-pay | Admitting: Gastroenterology

## 2022-01-15 ENCOUNTER — Other Ambulatory Visit: Payer: Self-pay

## 2022-02-05 ENCOUNTER — Other Ambulatory Visit: Payer: Self-pay

## 2022-02-05 DIAGNOSIS — Z79899 Other long term (current) drug therapy: Secondary | ICD-10-CM | POA: Diagnosis not present

## 2022-02-05 DIAGNOSIS — R1013 Epigastric pain: Secondary | ICD-10-CM | POA: Diagnosis not present

## 2022-02-05 DIAGNOSIS — M545 Low back pain, unspecified: Secondary | ICD-10-CM | POA: Diagnosis not present

## 2022-02-05 DIAGNOSIS — G8929 Other chronic pain: Secondary | ICD-10-CM | POA: Diagnosis not present

## 2022-02-05 DIAGNOSIS — F17210 Nicotine dependence, cigarettes, uncomplicated: Secondary | ICD-10-CM | POA: Diagnosis not present

## 2022-02-06 ENCOUNTER — Other Ambulatory Visit: Payer: Self-pay

## 2022-02-06 MED ORDER — DULOXETINE HCL 60 MG PO CPEP: ORAL_CAPSULE | ORAL | 3 refills | Status: DC

## 2022-02-07 ENCOUNTER — Other Ambulatory Visit: Payer: Self-pay

## 2022-02-17 ENCOUNTER — Other Ambulatory Visit: Payer: Self-pay | Admitting: Critical Care Medicine

## 2022-03-05 DIAGNOSIS — Z6824 Body mass index (BMI) 24.0-24.9, adult: Secondary | ICD-10-CM | POA: Diagnosis not present

## 2022-03-05 DIAGNOSIS — Z79899 Other long term (current) drug therapy: Secondary | ICD-10-CM | POA: Diagnosis not present

## 2022-03-05 DIAGNOSIS — R1013 Epigastric pain: Secondary | ICD-10-CM | POA: Diagnosis not present

## 2022-03-05 DIAGNOSIS — F17210 Nicotine dependence, cigarettes, uncomplicated: Secondary | ICD-10-CM | POA: Diagnosis not present

## 2022-03-05 DIAGNOSIS — G8929 Other chronic pain: Secondary | ICD-10-CM | POA: Diagnosis not present

## 2022-03-05 DIAGNOSIS — M545 Low back pain, unspecified: Secondary | ICD-10-CM | POA: Diagnosis not present

## 2022-03-10 ENCOUNTER — Ambulatory Visit: Payer: Medicare Other | Admitting: Critical Care Medicine

## 2022-03-10 DIAGNOSIS — Z79899 Other long term (current) drug therapy: Secondary | ICD-10-CM | POA: Diagnosis not present

## 2022-03-17 ENCOUNTER — Ambulatory Visit: Payer: Medicare Other | Admitting: Gastroenterology

## 2022-03-20 ENCOUNTER — Ambulatory Visit: Payer: Medicare Other | Attending: Critical Care Medicine | Admitting: Critical Care Medicine

## 2022-03-20 ENCOUNTER — Other Ambulatory Visit: Payer: Self-pay

## 2022-03-20 ENCOUNTER — Encounter: Payer: Self-pay | Admitting: Critical Care Medicine

## 2022-03-20 VITALS — BP 129/82 | HR 101 | Resp 16 | Ht 68.0 in | Wt 165.0 lb

## 2022-03-20 DIAGNOSIS — M792 Neuralgia and neuritis, unspecified: Secondary | ICD-10-CM | POA: Diagnosis not present

## 2022-03-20 DIAGNOSIS — Z122 Encounter for screening for malignant neoplasm of respiratory organs: Secondary | ICD-10-CM | POA: Diagnosis not present

## 2022-03-20 DIAGNOSIS — R110 Nausea: Secondary | ICD-10-CM | POA: Diagnosis not present

## 2022-03-20 DIAGNOSIS — J449 Chronic obstructive pulmonary disease, unspecified: Secondary | ICD-10-CM

## 2022-03-20 DIAGNOSIS — Z8719 Personal history of other diseases of the digestive system: Secondary | ICD-10-CM | POA: Diagnosis not present

## 2022-03-20 DIAGNOSIS — K860 Alcohol-induced chronic pancreatitis: Secondary | ICD-10-CM

## 2022-03-20 DIAGNOSIS — G621 Alcoholic polyneuropathy: Secondary | ICD-10-CM

## 2022-03-20 DIAGNOSIS — N401 Enlarged prostate with lower urinary tract symptoms: Secondary | ICD-10-CM

## 2022-03-20 DIAGNOSIS — J020 Streptococcal pharyngitis: Secondary | ICD-10-CM | POA: Diagnosis not present

## 2022-03-20 DIAGNOSIS — B182 Chronic viral hepatitis C: Secondary | ICD-10-CM

## 2022-03-20 DIAGNOSIS — Z139 Encounter for screening, unspecified: Secondary | ICD-10-CM

## 2022-03-20 DIAGNOSIS — E538 Deficiency of other specified B group vitamins: Secondary | ICD-10-CM

## 2022-03-20 DIAGNOSIS — J4489 Other specified chronic obstructive pulmonary disease: Secondary | ICD-10-CM

## 2022-03-20 DIAGNOSIS — Z0000 Encounter for general adult medical examination without abnormal findings: Secondary | ICD-10-CM

## 2022-03-20 DIAGNOSIS — M48062 Spinal stenosis, lumbar region with neurogenic claudication: Secondary | ICD-10-CM | POA: Diagnosis not present

## 2022-03-20 DIAGNOSIS — I739 Peripheral vascular disease, unspecified: Secondary | ICD-10-CM

## 2022-03-20 DIAGNOSIS — F1021 Alcohol dependence, in remission: Secondary | ICD-10-CM

## 2022-03-20 DIAGNOSIS — E440 Moderate protein-calorie malnutrition: Secondary | ICD-10-CM | POA: Diagnosis not present

## 2022-03-20 DIAGNOSIS — F17200 Nicotine dependence, unspecified, uncomplicated: Secondary | ICD-10-CM

## 2022-03-20 DIAGNOSIS — N521 Erectile dysfunction due to diseases classified elsewhere: Secondary | ICD-10-CM

## 2022-03-20 DIAGNOSIS — R351 Nocturia: Secondary | ICD-10-CM

## 2022-03-20 LAB — POCT RAPID STREP A (OFFICE): Rapid Strep A Screen: NEGATIVE

## 2022-03-20 MED ORDER — THIAMINE HCL 100 MG PO TABS: ORAL_TABLET | Freq: Every day | ORAL | 6 refills | Status: AC

## 2022-03-20 MED ORDER — BACLOFEN 10 MG PO TABS: 5.0000 mg | ORAL_TABLET | Freq: Three times a day (TID) | ORAL | 4 refills | Status: DC | PRN

## 2022-03-20 MED ORDER — VITAMIN D (ERGOCALCIFEROL) 1.25 MG (50000 UNIT) PO CAPS: 50000.0000 [IU] | ORAL_CAPSULE | ORAL | 3 refills | Status: AC

## 2022-03-20 MED ORDER — AMITRIPTYLINE HCL 50 MG PO TABS: 50.0000 mg | ORAL_TABLET | Freq: Every day | ORAL | 2 refills | Status: DC

## 2022-03-20 MED ORDER — PREGABALIN 150 MG PO CAPS
150.0000 mg | ORAL_CAPSULE | Freq: Three times a day (TID) | ORAL | 6 refills | Status: DC
  Filled 2022-03-20: qty 90, 30d supply, fill #0
  Filled 2022-04-18 – 2022-06-17 (×2): qty 90, 30d supply, fill #1
  Filled 2022-07-20 – 2022-07-28 (×2): qty 90, 30d supply, fill #2
  Filled 2022-09-13: qty 90, 30d supply, fill #3
  Filled 2022-10-13: qty 90, 30d supply, fill #4
  Filled 2022-11-13: qty 90, 30d supply, fill #5
  Filled 2022-12-16: qty 90, 30d supply, fill #6

## 2022-03-20 MED ORDER — AMOXICILLIN-POT CLAVULANATE 875-125 MG PO TABS
1.0000 | ORAL_TABLET | Freq: Two times a day (BID) | ORAL | 0 refills | Status: DC
  Filled 2022-03-20: qty 20, 10d supply, fill #0

## 2022-03-20 MED ORDER — FOLIC ACID 1 MG PO TABS: 1.0000 mg | ORAL_TABLET | Freq: Every day | ORAL | 4 refills | Status: AC

## 2022-03-20 MED ORDER — PANCRELIPASE (LIP-PROT-AMYL) 36000-114000 UNITS PO CPEP: ORAL_CAPSULE | ORAL | 1 refills | Status: DC

## 2022-03-20 MED ORDER — PANTOPRAZOLE SODIUM 40 MG PO TBEC: 40.0000 mg | DELAYED_RELEASE_TABLET | Freq: Two times a day (BID) | ORAL | 6 refills | Status: DC

## 2022-03-20 MED ORDER — DULOXETINE HCL 60 MG PO CPEP: ORAL_CAPSULE | ORAL | 3 refills | Status: DC

## 2022-03-20 MED ORDER — VITAMIN B-12 1000 MCG PO TABS: 1000.0000 ug | ORAL_TABLET | Freq: Every day | ORAL | 4 refills | Status: AC

## 2022-03-20 NOTE — Patient Instructions (Addendum)
Lung cancer screening test will be ordered ?Complete set of screening labs obtained today ?Follow a healthy diet as attached ?Refills on medications long-term sent to [facility] [place] and your antibiotic and Lyrica sent to our pharmacy downstairs ?Return to see Dr. Joya Gaskins 4 months ? ? ? ? ? ?Fat and Cholesterol Restricted Eating Plan ?Eating a diet that limits fat and cholesterol may help lower your risk for heart disease and other conditions. Your body needs fat and cholesterol for basic functions, but eating too much of these things can be harmful to your health. ?Your health care provider may order lab tests to check your blood fat (lipid) and cholesterol levels. This helps your health care provider understand your risk for certain conditions and whether you need to make diet changes. Work with your health care provider or dietitian to make an eating plan that is right for you. ?Your plan includes: ?Limit your fat intake to ______% or less of your total calories a Armon. This is ______g of fat per Eissler. ?Limit your saturated fat intake to ______% or less of your total calories a Timberlake. This is ______g of saturated fat per Putzier. ?Limit the amount of cholesterol in your diet to less than _________mg a Teng. ?Eat ___________ g of fiber a Diviney. ?What are tips for following this plan? ?General guidelines ?If you are overweight, work with your health care provider to lose weight safely. Losing just 5-10% of your body weight can improve your overall health and help prevent diseases such as diabetes and heart disease. ?Avoid: ?Foods with added sugar. ?Fried foods. ?Foods that contain partially hydrogenated oils, including stick margarine, some tub margarines, cookies, crackers, and other baked goods. ?If you drink alcohol: ?Limit how much you have to: ?0-1 drink a Zuercher for women who are not pregnant. ?0-2 drinks a Goldinger for men. ?Know how much alcohol is in a drink. In the [place] one drink equals one 12 oz bottle of beer (355 mL), one  5 oz glass of wine (148 mL), or one 1? oz glass of hard liquor (44 mL). ?Reading food labels ?Check food labels for: ?Trans fats or partially hydrogenated oils. Avoid foods that contain these. ?High amounts of saturated fat. Choose foods that are low in saturated fat (less than 2 g). ?The amount of cholesterol in each serving. ?The amount of fiber in each serving. ?Choose foods with healthy fats, such as: ?Monounsaturated and polyunsaturated fats. These include olive and canola oil, flaxseeds, walnuts, almonds, and seeds. ?Omega-3 fats. These are found in foods such as salmon, mackerel, sardines, tuna, flaxseed oil, and ground flaxseeds. ?Choose grain products that have whole grains. Look for the word "whole" as the first word in the ingredient list. ?Cooking ?Cook foods using methods other than frying. Baking, boiling, grilling, and broiling are some healthy options. ?Eat more home-cooked food and less restaurant, buffet, and fast food. ?Avoid cooking using saturated fats. ?Animal sources of saturated fats include meats, butter, and cream. ?Plant sources of saturated fats include palm oil, palm kernel oil, and coconut oil. ?Meal planning ? ?At meals, imagine dividing your plate into fourths: ?Fill one-half of your plate with vegetables, green salads, and fruit. ?Fill one-fourth of your plate with whole grains. ?Fill one-fourth of your plate with lean protein foods. ?Eat fish that is high in omega-3 fats at least two times a week. ?Eat more foods that contain fiber, such as whole grains, beans, apples, pears, berries, broccoli, carrots, peas, and barley. These foods help promote healthy  cholesterol levels in the blood. ?What foods should I eat? ?Fruits ?All fresh, canned (in natural juice), or frozen fruits. ?Vegetables ?Fresh or frozen vegetables (raw, steamed, roasted, or grilled). Green salads. ?Grains ?Whole grains, such as whole wheat or whole grain breads, crackers, cereals, and pasta. Unsweetened oatmeal,  bulgur, barley, quinoa, or brown rice. Corn or whole wheat flour tortillas. ?Meats and other proteins ?Ground beef (85% or leaner), grass-fed beef, or beef trimmed of fat. Skinless chicken or [place]. Ground chicken or [place]. Pork trimmed of fat. All fish and seafood. Egg whites. Dried beans, peas, or lentils. Unsalted nuts or seeds. Unsalted canned beans. Natural nut butters without added sugar and oil. ?Dairy ?Low-fat or nonfat dairy products, such as skim or 1% milk, 2% or reduced-fat cheeses, low-fat and fat-free ricotta or cottage cheese, or plain low-fat and nonfat yogurt. ?Fats and oils ?Tub margarine without trans fats. Light or reduced-fat mayonnaise and salad dressings. Avocado. Olive, canola, sesame, or safflower oils. ?The items listed above may not be a complete list of foods and beverages you can eat. Contact a dietitian for more information. ?What foods should I avoid? ?Fruits ?Canned fruit in heavy syrup. Fruit in cream or butter sauce. Fried fruit. ?Vegetables ?Vegetables cooked in cheese, cream, or butter sauce. Fried vegetables. ?Grains ?White bread. White pasta. White rice. Cornbread. Bagels, pastries, and croissants. Crackers and snack foods that contain trans fat and hydrogenated oils. ?Meats and other proteins ?Fatty cuts of meat. Ribs, chicken wings, bacon, sausage, bologna, salami, chitterlings, fatback, hot dogs, bratwurst, and packaged lunch meats. Liver and organ meats. Whole eggs and egg yolks. Chicken and [place] with skin. Fried meat. ?Dairy ?Whole or 2% milk, cream, half-and-half, and cream cheese. Whole milk cheeses. Whole-fat or sweetened yogurt. Full-fat cheeses. Nondairy creamers and whipped toppings. Processed cheese, cheese spreads, and cheese curds. ?Fats and oils ?Butter, stick margarine, lard, shortening, ghee, or bacon fat. Coconut, palm kernel, and palm oils. ?Beverages ?Alcohol. Sugar-sweetened drinks such as sodas, lemonade, and fruit drinks. ?Sweets and desserts ?Corn  syrup, sugars, honey, and molasses. Candy. Jam and jelly. Syrup. Sweetened cereals. Cookies, pies, cakes, donuts, muffins, and ice cream. ?The items listed above may not be a complete list of foods and beverages you should avoid. Contact a dietitian for more information. ?Summary ?Your body needs fat and cholesterol for basic functions. However, eating too much of these things can be harmful to your health. ?Work with your health care provider and dietitian to follow a diet that limits fat and cholesterol. Doing this may help lower your risk for heart disease and other conditions. ?Choose healthy fats, such as monounsaturated and polyunsaturated fats, and foods high in omega-3 fatty acids. ?Eat fiber-rich foods, such as whole grains, beans, peas, fruits, and vegetables. ?Limit or avoid alcohol, fried foods, and foods high in saturated fats, partially hydrogenated oils, and sugar. ?This information is not intended to replace advice given to you by your health care provider. Make sure you discuss any questions you have with your health care provider. ?Document Revised: 03/29/2021 Document Reviewed: 03/29/2021 ?Elsevier Patient Education ? [place]. ? ?Fall Prevention in the Home, Adult ?Falls can cause injuries and can happen to people of all ages. There are many things you can do to make your home safe and to help prevent falls. Ask for help when making these changes. ?What actions can I take to prevent falls? ?General Instructions ?Use good lighting in all rooms. Replace any light bulbs that burn out. ?Turn on the  lights in dark areas. Use night-lights. ?Keep items that you use often in easy-to-reach places. Lower the shelves around your home if needed. ?Set up your furniture so you have a clear path. Avoid moving your furniture around. ?Do not have throw rugs or other things on the floor that can make you trip. ?Avoid walking on wet floors. ?If any of your floors are uneven, fix them. ?Add color or  contrast paint or tape to clearly mark and help you see: ?Grab bars or handrails. ?First and last steps of staircases. ?Where the edge of each step is. ?If you use a stepladder: ?Make sure that it is fully opened
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
## 2022-03-20 NOTE — Assessment & Plan Note (Signed)
Strep screen negative we will continue with Augmentin for 7 days ?

## 2022-03-20 NOTE — Assessment & Plan Note (Signed)
Continue replacement with [name] ?

## 2022-03-20 NOTE — Assessment & Plan Note (Signed)
Has improved diet ?

## 2022-03-20 NOTE — Assessment & Plan Note (Signed)
Continue chronic pain management and refill neuropathic medications ?

## 2022-03-20 NOTE — Assessment & Plan Note (Signed)
Plan to screen with CT scan low-dose ? ?No change in inhalers ?

## 2022-03-20 NOTE — Assessment & Plan Note (Signed)
Currently not drinking alcohol ?

## 2022-03-20 NOTE — Assessment & Plan Note (Signed)
Prostate mildly enlarged we will recheck PSA ?

## 2022-03-20 NOTE — Assessment & Plan Note (Signed)
? ? ??  Current smoking consumption amount: 1/2 pack a Conkright ? ?? Dicsussion on advise to quit smoking and smoking impacts: Impacts on lung health and pancreatic health ? ?? Patient's willingness to quit: Uncertain ? ?? Methods to quit smoking discussed: Behavioral modification nicotine replacement ? ?? Medication management of smoking session drugs discussed: Advised to pick up nicotine patch and lozenge takes at the same time ? ?? Resources provided:  AVS  ? ?? Setting quit date not established ? ?? Follow-up arranged 4 months ? ? ?Time spent counseling the patient: 5 minutes ? ?

## 2022-03-20 NOTE — Assessment & Plan Note (Signed)
Patient not using Viagra we will discontinue ?

## 2022-03-21 LAB — BASIC METABOLIC PANEL
BUN/Creatinine Ratio: 6 — ABNORMAL LOW (ref 9–20)
BUN: 6 mg/dL (ref 6–24)
CO2: 26 mmol/L (ref 20–29)
Calcium: 9.7 mg/dL (ref 8.7–10.2)
Chloride: 96 mmol/L (ref 96–106)
Creatinine, Ser: 1.03 mg/dL (ref 0.76–1.27)
Glucose: 99 mg/dL (ref 70–99)
Potassium: 4 mmol/L (ref 3.5–5.2)
Sodium: 139 mmol/L (ref 134–144)
eGFR: 85 mL/min/{1.73_m2} (ref >59)

## 2022-03-21 LAB — HEPATIC FUNCTION PANEL
ALT: 24 IU/L (ref 0–44)
AST: 39 IU/L (ref 0–40)
Albumin: 3.9 g/dL (ref 3.8–4.9)
Alkaline Phosphatase: 109 IU/L (ref 44–121)
Bilirubin Total: 1.3 mg/dL — ABNORMAL HIGH (ref 0.0–1.2)
Bilirubin, Direct: 0.44 mg/dL — ABNORMAL HIGH (ref 0.00–0.40)
Total Protein: 7.5 g/dL (ref 6.0–8.5)

## 2022-03-21 LAB — PSA: Prostate Specific Ag, Serum: 2.5 ng/mL (ref 0.0–4.0)

## 2022-03-21 LAB — LIPID PANEL
Chol/HDL Ratio: 3.1 ratio (ref 0.0–5.0)
Cholesterol, Total: 87 mg/dL — ABNORMAL LOW (ref 100–199)
HDL: 28 mg/dL — ABNORMAL LOW (ref >39)
LDL Chol Calc (NIH): 41 mg/dL (ref 0–99)
Triglycerides: 91 mg/dL (ref 0–149)
VLDL Cholesterol Cal: 18 mg/dL (ref 5–40)

## 2022-03-21 LAB — HEMOGLOBIN A1C
Est. average glucose Bld gHb Est-mCnc: 117 mg/dL
Hgb A1c MFr Bld: 5.7 % — ABNORMAL HIGH (ref 4.8–5.6)

## 2022-03-21 LAB — CBC WITH DIFFERENTIAL/PLATELET
Basophils Absolute: 0.1 10*3/uL (ref 0.0–0.2)
Basos: 1 %
EOS (ABSOLUTE): 0.4 10*3/uL (ref 0.0–0.4)
Eos: 2 %
Hematocrit: 43.4 % (ref 37.5–51.0)
Hemoglobin: 15.6 g/dL (ref 13.0–17.7)
Immature Grans (Abs): 0.1 10*3/uL (ref 0.0–0.1)
Immature Granulocytes: 1 %
Lymphocytes Absolute: 2.5 10*3/uL (ref 0.7–3.1)
Lymphs: 12 %
MCH: 32.3 pg (ref 26.6–33.0)
MCHC: 35.9 g/dL — ABNORMAL HIGH (ref 31.5–35.7)
MCV: 90 fL (ref 79–97)
Monocytes Absolute: 2 10*3/uL — ABNORMAL HIGH (ref 0.1–0.9)
Monocytes: 9 %
Neutrophils Absolute: 16.4 10*3/uL — ABNORMAL HIGH (ref 1.4–7.0)
Neutrophils: 75 %
Platelets: 274 10*3/uL (ref 150–450)
RBC: 4.83 x10E6/uL (ref 4.14–5.80)
RDW: 12 % (ref 11.6–15.4)
WBC: 21.5 10*3/uL (ref 3.4–10.8)

## 2022-03-24 ENCOUNTER — Telehealth: Payer: Self-pay

## 2022-03-24 NOTE — Telephone Encounter (Signed)
-----  Message from [name], MD sent at 03/21/2022  7:20 AM EDT ----- ?Let mr Mccaig know his white count is very high due to infection  after he finishes his antibiotic I need to see him back for short term f/u and CBC check in two weeks book him on schedule. ?No diabetes   PSA is negative, cholesterol normal, liver normal ?

## 2022-03-24 NOTE — Telephone Encounter (Signed)
Pt was called and is aware of results, DOB was confirmed.  ?
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
## 2022-04-07 ENCOUNTER — Other Ambulatory Visit: Payer: Self-pay

## 2022-04-07 ENCOUNTER — Ambulatory Visit: Payer: Medicare Other | Admitting: Critical Care Medicine

## 2022-04-07 ENCOUNTER — Encounter: Payer: Self-pay | Admitting: Critical Care Medicine

## 2022-04-07 ENCOUNTER — Ambulatory Visit: Payer: Medicare Other | Attending: Critical Care Medicine | Admitting: Critical Care Medicine

## 2022-04-07 VITALS — BP 125/84 | HR 101 | Wt 164.6 lb

## 2022-04-07 DIAGNOSIS — R1314 Dysphagia, pharyngoesophageal phase: Secondary | ICD-10-CM

## 2022-04-07 DIAGNOSIS — D72825 Bandemia: Secondary | ICD-10-CM | POA: Diagnosis not present

## 2022-04-07 DIAGNOSIS — F17200 Nicotine dependence, unspecified, uncomplicated: Secondary | ICD-10-CM | POA: Diagnosis not present

## 2022-04-07 DIAGNOSIS — F17210 Nicotine dependence, cigarettes, uncomplicated: Secondary | ICD-10-CM

## 2022-04-07 DIAGNOSIS — J020 Streptococcal pharyngitis: Secondary | ICD-10-CM | POA: Diagnosis not present

## 2022-04-07 HISTORY — DX: Dysphagia, pharyngoesophageal phase: R13.14

## 2022-04-07 MED ORDER — NICOTINE 21 MG/24HR TD PT24
21.0000 mg | MEDICATED_PATCH | Freq: Every day | TRANSDERMAL | 0 refills | Status: DC
  Filled 2022-04-07: qty 28, 28d supply, fill #0

## 2022-04-07 NOTE — Assessment & Plan Note (Signed)
Concerned about inability to swallow will obtain modified barium swallow ?

## 2022-04-07 NOTE — Assessment & Plan Note (Signed)
This has resolved no further antibiotics ?

## 2022-04-07 NOTE — Assessment & Plan Note (Signed)
White count 21,000 with bandemia we will repeat CBC and have pathology review differential and peripheral smear ?

## 2022-04-07 NOTE — Assessment & Plan Note (Addendum)
? ? ??  Current smoking consumption amount: 1 pack a [place] ? ?? Dicsussion on advise to quit smoking and smoking impacts: Impacts on lung health and pancreatic health ? ?? Patient's willingness to quit: Uncertain ? ?? Methods to quit smoking discussed: Behavioral modification nicotine replacement ? ?? Medication management of smoking session drugs discussed: Advised to pick up nicotine patch and lozenge takes at the same time ? ?? Resources provided:  AVS  ? ?? Setting quit date not established ? ?? Follow-up arranged 4 months ? ? ?Time spent counseling the patient: 5 minutes ? ?

## 2022-04-07 NOTE — Patient Instructions (Addendum)
Stop smoking use nicotine patch and see attachment, nicotine patch sent to our pharmacy downstairs ? ?Swallowing study will be obtained in radiology, based on results may either need to send you to ear nose and throat or gastroenterology ? ?Keep your CT scan appointment May 18 ? ?Blood count will be rechecked again today ? ?We will call you results ? ?Return to Dr. Joya Gaskins for regular follow-up in 4 months ?

## 2022-04-08 LAB — CBC WITH DIFFERENTIAL/PLATELET
Basophils Absolute: 0.1 10*3/uL (ref 0.0–0.2)
Basos: 1 %
EOS (ABSOLUTE): 0.5 10*3/uL — ABNORMAL HIGH (ref 0.0–0.4)
Eos: 5 %
Hematocrit: 45.2 % (ref 37.5–51.0)
Hemoglobin: 16.1 g/dL (ref 13.0–17.7)
Immature Grans (Abs): 0.1 10*3/uL (ref 0.0–0.1)
Immature Granulocytes: 1 %
Lymphocytes Absolute: 2.1 10*3/uL (ref 0.7–3.1)
Lymphs: 21 %
MCH: 31.8 pg (ref 26.6–33.0)
MCHC: 35.6 g/dL (ref 31.5–35.7)
MCV: 89 fL (ref 79–97)
Monocytes Absolute: 1.1 10*3/uL — ABNORMAL HIGH (ref 0.1–0.9)
Monocytes: 11 %
Neutrophils Absolute: 6.4 10*3/uL (ref 1.4–7.0)
Neutrophils: 61 %
Platelets: 392 10*3/uL (ref 150–450)
RBC: 5.06 x10E6/uL (ref 4.14–5.80)
RDW: 12.6 % (ref 11.6–15.4)
WBC: 10.3 10*3/uL (ref 3.4–10.8)

## 2022-04-09 ENCOUNTER — Other Ambulatory Visit: Payer: Self-pay

## 2022-04-14 ENCOUNTER — Other Ambulatory Visit: Payer: Self-pay

## 2022-04-16 LAB — PATHOLOGIST SMEAR REVIEW
Basophils Absolute: 0.1 10*3/uL (ref 0.0–0.2)
Basos: 1 %
EOS (ABSOLUTE): 0.6 10*3/uL — ABNORMAL HIGH (ref 0.0–0.4)
Eos: 5 %
Hematocrit: 46.6 % (ref 37.5–51.0)
Hemoglobin: 16 g/dL (ref 13.0–17.7)
Immature Grans (Abs): 0 10*3/uL (ref 0.0–0.1)
Immature Granulocytes: 0 %
Lymphocytes Absolute: 2.2 10*3/uL (ref 0.7–3.1)
Lymphs: 21 %
MCH: 31.8 pg (ref 26.6–33.0)
MCHC: 34.3 g/dL (ref 31.5–35.7)
MCV: 93 fL (ref 79–97)
Monocytes Absolute: 1.2 10*3/uL — ABNORMAL HIGH (ref 0.1–0.9)
Monocytes: 11 %
Neutrophils Absolute: 6.6 10*3/uL (ref 1.4–7.0)
Neutrophils: 62 %
Path Rev PLTs: NORMAL
Path Rev RBC: NORMAL
Path Rev WBC: NORMAL
Platelets: 425 10*3/uL (ref 150–450)
RBC: 5.03 x10E6/uL (ref 4.14–5.80)
RDW: 12.8 % (ref 11.6–15.4)
WBC: 10.6 10*3/uL (ref 3.4–10.8)

## 2022-04-17 ENCOUNTER — Other Ambulatory Visit: Payer: Medicare Other

## 2022-04-17 ENCOUNTER — Ambulatory Visit
Admission: RE | Admit: 2022-04-17 | Discharge: 2022-04-17 | Disposition: A | Discharge status: Home / Self Care | Payer: Medicare Other | Source: Ambulatory Visit | Attending: Critical Care Medicine | Admitting: Critical Care Medicine

## 2022-04-17 DIAGNOSIS — I2510 Atherosclerotic heart disease of native coronary artery without angina pectoris: Secondary | ICD-10-CM | POA: Diagnosis not present

## 2022-04-17 DIAGNOSIS — F17210 Nicotine dependence, cigarettes, uncomplicated: Secondary | ICD-10-CM | POA: Diagnosis not present

## 2022-04-17 DIAGNOSIS — K8689 Other specified diseases of pancreas: Secondary | ICD-10-CM | POA: Diagnosis not present

## 2022-04-17 DIAGNOSIS — N62 Hypertrophy of breast: Secondary | ICD-10-CM | POA: Diagnosis not present

## 2022-04-17 DIAGNOSIS — Z122 Encounter for screening for malignant neoplasm of respiratory organs: Secondary | ICD-10-CM

## 2022-04-18 ENCOUNTER — Telehealth: Payer: Self-pay

## 2022-04-18 ENCOUNTER — Other Ambulatory Visit: Payer: Self-pay

## 2022-04-18 NOTE — Telephone Encounter (Signed)
-----  Message from [name], MD sent at 04/18/2022  3:02 PM EDT ----- Let pt know CT shows no lung cancer

## 2022-04-18 NOTE — Telephone Encounter (Signed)
Pt was called and vm was left, Information has been sent to nurse pool.

## 2022-04-21 ENCOUNTER — Other Ambulatory Visit: Payer: Self-pay | Admitting: Critical Care Medicine

## 2022-04-21 ENCOUNTER — Telehealth: Payer: Self-pay | Admitting: Critical Care Medicine

## 2022-04-21 ENCOUNTER — Ambulatory Visit (HOSPITAL_COMMUNITY)
Admission: RE | Admit: 2022-04-21 | Discharge: 2022-04-21 | Disposition: A | Discharge status: Home / Self Care | Payer: Medicare Other | Source: Ambulatory Visit | Attending: Critical Care Medicine | Admitting: Critical Care Medicine

## 2022-04-21 DIAGNOSIS — R1314 Dysphagia, pharyngoesophageal phase: Secondary | ICD-10-CM | POA: Insufficient documentation

## 2022-04-21 DIAGNOSIS — K222 Esophageal obstruction: Secondary | ICD-10-CM

## 2022-04-21 DIAGNOSIS — R1310 Dysphagia, unspecified: Secondary | ICD-10-CM | POA: Diagnosis not present

## 2022-04-21 DIAGNOSIS — R1319 Other dysphagia: Secondary | ICD-10-CM

## 2022-04-21 NOTE — Telephone Encounter (Signed)
I have already responded to this by secure chat

## 2022-04-21 NOTE — Telephone Encounter (Signed)
Routing to pcp for review

## 2022-04-21 NOTE — Telephone Encounter (Signed)
Ok noted

## 2022-04-21 NOTE — Telephone Encounter (Signed)
Wesley [name] Speech Pathologist is calling in order to clarify orders from Dr. Joya Gaskins. Does Dr. Joya Gaskins want modified barium swallow or esophagram? Notes state modified barium swallow and order states esophagram. Please advise   Pt is coming in today at 11am for a swallow test.  CB- [phone]

## 2022-04-22 ENCOUNTER — Encounter: Payer: Self-pay | Admitting: Gastroenterology

## 2022-04-24 ENCOUNTER — Other Ambulatory Visit: Payer: Self-pay | Admitting: Pharmacist

## 2022-04-24 MED ORDER — TAMSULOSIN HCL 0.4 MG PO CAPS: 0.4000 mg | ORAL_CAPSULE | Freq: Every day | ORAL | 1 refills | Status: DC

## 2022-04-25 DIAGNOSIS — F17210 Nicotine dependence, cigarettes, uncomplicated: Secondary | ICD-10-CM | POA: Diagnosis not present

## 2022-04-25 DIAGNOSIS — M545 Low back pain, unspecified: Secondary | ICD-10-CM | POA: Diagnosis not present

## 2022-04-25 DIAGNOSIS — R1013 Epigastric pain: Secondary | ICD-10-CM | POA: Diagnosis not present

## 2022-04-25 DIAGNOSIS — Z79899 Other long term (current) drug therapy: Secondary | ICD-10-CM | POA: Diagnosis not present

## 2022-04-25 DIAGNOSIS — G8929 Other chronic pain: Secondary | ICD-10-CM | POA: Diagnosis not present

## 2022-04-27 ENCOUNTER — Other Ambulatory Visit: Payer: Self-pay | Admitting: Critical Care Medicine

## 2022-05-01 DIAGNOSIS — Z79899 Other long term (current) drug therapy: Secondary | ICD-10-CM | POA: Diagnosis not present

## 2022-05-19 ENCOUNTER — Ambulatory Visit (INDEPENDENT_AMBULATORY_CARE_PROVIDER_SITE_OTHER): Payer: Medicare Other | Admitting: Gastroenterology

## 2022-05-19 ENCOUNTER — Encounter: Payer: Self-pay | Admitting: Gastroenterology

## 2022-05-19 VITALS — BP 120/78 | HR 90 | Ht 68.0 in | Wt 160.0 lb

## 2022-05-19 DIAGNOSIS — R1013 Epigastric pain: Secondary | ICD-10-CM | POA: Diagnosis not present

## 2022-05-19 DIAGNOSIS — R1310 Dysphagia, unspecified: Secondary | ICD-10-CM

## 2022-05-19 DIAGNOSIS — R933 Abnormal findings on diagnostic imaging of other parts of digestive tract: Secondary | ICD-10-CM | POA: Diagnosis not present

## 2022-05-19 DIAGNOSIS — K860 Alcohol-induced chronic pancreatitis: Secondary | ICD-10-CM | POA: Diagnosis not present

## 2022-05-19 DIAGNOSIS — Z86010 Personal history of colonic polyps: Secondary | ICD-10-CM

## 2022-05-19 NOTE — Progress Notes (Signed)
Chief Complaint:    Abnormal barium esophagram, dysphagia  GI History: 56 year old male with a history of COPD, chronic low back pain with sciatica, history of alcohol abuse (abstinent since 01/2018), history of acute recurrent pancreatitis superimposed on chronic pancreatitis, HCV s/p tx w/ SVR, hepatic steatosis, hx of ccy 12/2018.   Pertinent evaluation to date: -EGD (05/2019, Dr. [name]): Mild gastritis, otherwise normal -Colonoscopy (05/2019, Dr. [name]): 7 polyps (TA x6), sigmoid diverticulosis, small internal hemorrhoids.  Normal TI.  Repeat in 3 years -CT A/P (12/30/2020): Peripancreatic fatty stranding without pseudocyst or necrosis - 12/2020: TG normal.  HIV negative, IgG4 negative -Repeat CT A/P (01/04/2021): Acute, nonnecrotizing pancreatitis, worsening in the pancreatic tail but improved in HOP region - MRCP (02/21/2021): 3.3 x 2.2 x 2.3 cm region in the inferior aspect of the pancreatic head with indeterminate imaging characteristics, similar to MRI from 04/2019.  Focal narrowing of distal CBD shortly before level of ampulla.  Hepatic steatosis. - 01/2021: Normal pancreatic elastase, lipase, CBC, CMP - EUS (03/21/2021, Dr. [name]): Candida esophagitis, mild gastritis, irregular, partially hypoechoic 33 x 28 mm region in pancreas in the region of distal CBD narrowing/stricture (path: Atypical cells favored to be reactive).  Tortuous/ectatic PD.  Features of chronic pancreatitis (atrophy, hyperechoic foci with shadowing, lobularity without honeycombing).  Dilated common hepatic duct with normal CBD. - 4/22: CA 19-9: 38 (ULN 34) - EUS (05/22/2021, Dr. [name]): Severe gastritis.  Narrowing/stricture of distal CBD with CBD measuring 5.2 mm.  Irregular area in the HOP which was homogenous and masslike with calcifications measuring 29 x 29 mm (path: Mild atypia, no malignant cells).  Is located where the CBD becomes narrowed/strictured.  No invasion with local vasculature.  Irregularly  contoured PD.  Findings of chronic pancreatitis. -09/20/2021: Seen in consultation at [facility] for chronic pancreatitis and possible pancreatic mass.  Recommended repeat EUS along with multimodal approach to pain management.  Started on baclofen -10/02/2021: EUS at [facility]: Appearance c/w chronic pancreatitis, possible intraductal stone in HOP PD.  Recommend MRI/MRCP.  Dilation of common hepatic duct with tapering to the level of ampulla without stones or sludge.  Normal visualized portions of the liver.  Acute esophagitiis; negative for Candida esophagitis. Recommended returning to Dr. Cleda Mccreedy.  CEA 4.9; recommended repeat colonoscopy locally.  Started on baclofen for pain without much improvement.  Recommending Pain Management team - 12/03/2021: CT pancreas: 15 mm right hepatic cyst and mild intrahepatic duct dilation up to 11 mm with smooth tapering at the ampulla.  Area of hypoenhancement again seen in pancreas measuring 2.5 x 3.8 cm, unchanged from previous studies. - 12/24/2021: Colonoscopy: 8 mm descending polyp (path: SSP), Area of granular mucosa in sigmoid located 59 cm from anal verge and feels intense diverticulosis (path: Benign), Sigmoid diverticulosis, 3 mm sigmoid polyp (path: TA), Internal hemorrhoids.  Recommended repeat in 5 years  HPI:     Patient is a 56 y.o. male presenting to the [facility]ology Clinic for follow-up and evaluation of recent abnormal barium esophagram and dysphagia.  Was last seen in this office on 06/19/2021.  Since then has been following with [facility], to include EUS in November and imaging earlier this year.  -04/17/2022: CT chest for lung CA screening: Moderate hepatic steatosis, pancreatic atrophy with suggestion of mild duct dilation within head and neck.  Lung RADS 2; repeat in 12 months - 04/21/2022: Barium esophagram for evaluation of dysphagia and odynophagia: Normal esophagus, mild fold thickening, trace gastroesophageal reflux to distal third of  esophagus with provocation maneuvers.  13 mm barium tablet became lodged at the GE junction and passed after several attempts at drinking water.  He reports developing dysphagia over the last 2 months, pointing to anterior neck and mid sternum. No odynophagia. No regurgitation of chewed foods.  No food impactions.  Worse with meats. Has changed his diet to avoid exacerbating foods. No hematochezia, melena.   Weight stable. Still smoking but no EtOH.    Following with Dr. Mee Hives in [facility]. Was last seen in [facility] on 12/26/2021.  Reviewed labs from May and April: Normal CBC, BMP, LFTs (Tbili 1.3 and stable).   Review of systems:     No chest pain, no SOB, no fevers, no urinary sx   Past Medical History:  Diagnosis Date   Acute pancreatitis    Alcohol abuse    quit 04/2019   Arthritis    Cholecystitis 01/2018   Chronic hepatitis C ([place])    EMPHYSEMATOUS BLEB 08/07/2010   Qualifier: Diagnosis of  By: [name] MD, [name]    GERD (gastroesophageal reflux disease)    History of blood transfusion    Neuropathy    left leg, feet   Radial nerve compression    right   Spinal stenosis, lumbar    SPONTANEOUS PNEUMOTHORAX 08/07/2010   Qualifier: History of  By: Tilden Dome      Patient's surgical history, family medical history, social history, medications and allergies were all reviewed in Epic    Current Outpatient Medications  Medication Sig Dispense Refill   albuterol (VENTOLIN HFA) 108 (90 Base) MCG/ACT inhaler Inhale 2 puffs into the lungs every 6 (six) hours as needed for wheezing or shortness of breath. 8.5 g 0   amitriptyline ([name]) 50 MG tablet Take 1 tablet (50 mg total) by mouth at bedtime. 90 tablet 2   amoxicillin-clavulanate (AUGMENTIN) 875-125 MG tablet Take 1 tablet by mouth 2 (two) times daily. 20 tablet 0   baclofen (LIORESAL) 10 MG tablet Take 0.5-1 tablets (5-10 mg total) by mouth every 8 (eight) hours as needed. 60 each 4    budesonide-formoterol (SYMBICORT) 160-4.5 MCG/ACT inhaler Inhale 2 puffs into the lungs 2 (two) times daily. 10.2 g 12   DULoxetine (CYMBALTA) 60 MG capsule Take 1 capsule by mouth daily 30 capsule 3   folic acid (FOLVITE) 1 MG tablet Take 1 tablet (1 mg total) by mouth daily. 60 tablet 4   lipase/protease/amylase ([name]) 36000 UNITS CPEP capsule Take 2 capsules (72,000 Units total) by mouth 3 (three) times daily with meals AND 1 capsule (36,000 Units total) with snacks. 280 capsule 1   Multiple Vitamins-Minerals (COMPLETE) TABS daily.     nicotine (NICODERM CQ - DOSED IN MG/24 HOURS) 21 mg/24hr patch Place 1 patch (21 mg total) onto the skin daily. 28 patch 0   nicotine polacrilex (COMMIT) 4 MG lozenge TAKE THREE A [place] TO STOP SMOKING (Patient not taking: Reported on 04/07/2022) 100 lozenge 4   ondansetron (ZOFRAN) 4 MG tablet Take 1 tablet (4 mg total) by mouth every 8 (eight) hours as needed for nausea or vomiting. 20 tablet 1   oxyCODONE-acetaminophen (PERCOCET) 7.5-325 MG tablet Take 1 tablet by mouth every 8 (eight) hours as needed for severe pain. 8 tablet 0   pantoprazole (PROTONIX) 40 MG tablet Take 1 tablet (40 mg total) by mouth 2 (two) times daily before a meal. 60 tablet 6   Polyethylene Glycol 3350 (MIRALAX PO) Take by mouth.  PRN     pregabalin (LYRICA) 150 MG capsule Take 1 capsule (150 mg total) by mouth in the morning, at noon, and at bedtime. 90 capsule 6   tamsulosin (FLOMAX) 0.4 MG CAPS capsule Take 1 capsule (0.4 mg total) by mouth daily. 90 capsule 1   thiamine 100 MG tablet TAKE 1 TABLET (100 MG TOTAL) BY MOUTH DAILY. 30 tablet 6   vitamin B-12 (CYANOCOBALAMIN) 1000 MCG tablet Take 1 tablet (1,000 mcg total) by mouth daily. 60 tablet 4   Vitamin D, Ergocalciferol, (DRISDOL) 1.25 MG (50000 UNIT) CAPS capsule Take 1 capsule (50,000 Units total) by mouth every 7 (seven) days. 14 capsule 3   No current facility-administered medications for this visit.    Physical Exam:      There were no vitals taken for this visit.  GENERAL:  Pleasant male in NAD PSYCH: : Cooperative, normal affect EENT:  conjunctiva pink, mucous membranes moist, neck supple without masses CARDIAC:  RRR, no murmur heard, no peripheral edema PULM: Normal respiratory effort, lungs CTA bilaterally, no wheezing ABDOMEN: Generalized tenderness to just light touch.  Nondistended, soft.  Normal bowel sounds NEURO: Alert and oriented x 3, no focal neurologic deficits   IMPRESSION and PLAN:    1) Dysphagia 2) Abnormal barium esophagram  - EGD for diagnostic and potentially therapeutic intent with esophageal dilation and/or biopsies as appropriate - We will expedite procedure to be done this week or early next week pending his schedule - In the meantime, soft foods, chew thoroughly, and plenty of fluids with meals  3) Chronic pancreatitis 4) Chronic abdominal pain - Continue follow-up at [facility] follow-up in [facility]  5) History of colon polyps - Repeat colonoscopy in 2028 for ongoing polyp surveillance  The indications, risks, and benefits of EGD were explained to the patient in detail. Risks include but are not limited to bleeding, perforation, adverse reaction to medications, and cardiopulmonary compromise. Sequelae include but are not limited to the possibility of surgery, hospitalization, and mortality. The patient verbalized understanding and wished to proceed. All questions answered, referred to scheduler. Further recommendations pending results of the exam.             Dominic Pea Saloni Lablanc ,DO, FACG 05/19/2022, 8:21 AM

## 2022-05-19 NOTE — Patient Instructions (Addendum)
If you are age 57 or younger, your body mass index should be between 19-25. Your Body mass index is 24.33 kg/m. If this is out of the aformentioned range listed, please consider follow up with your Primary Care Provider.   __________________________________________________________  The [facility] providers would like to encourage you to use [facility] to communicate with providers for non-urgent requests or questions.  Due to long hold times on the telephone, sending your provider a message by [facility] may be a faster and more efficient way to get a response.  Please allow 48 business hours for a response.  Please remember that this is for non-urgent requests.   Due to recent changes in healthcare laws, you may see the results of your imaging and laboratory studies on MyChart before your provider has had a chance to review them.  We understand that in some cases there may be results that are confusing or concerning to you. Not all laboratory results come back in the same time frame and the provider may be waiting for multiple results in order to interpret others.  Please give [place] 48 hours in order for your provider to thoroughly review all the results before contacting the office for clarification of your results.   You have been scheduled for an endoscopy. Please follow written instructions given to you at your visit today. If you use inhalers (even only as needed), please bring them with you on the [place] of your procedure.     Thank you for choosing me and [facility].  Vito Cirigliano, D.O.

## 2022-05-21 ENCOUNTER — Other Ambulatory Visit: Payer: Self-pay | Admitting: Critical Care Medicine

## 2022-05-21 DIAGNOSIS — F17210 Nicotine dependence, cigarettes, uncomplicated: Secondary | ICD-10-CM | POA: Diagnosis not present

## 2022-05-21 DIAGNOSIS — Z6824 Body mass index (BMI) 24.0-24.9, adult: Secondary | ICD-10-CM | POA: Diagnosis not present

## 2022-05-21 DIAGNOSIS — R1013 Epigastric pain: Secondary | ICD-10-CM | POA: Diagnosis not present

## 2022-05-21 DIAGNOSIS — M545 Low back pain, unspecified: Secondary | ICD-10-CM | POA: Diagnosis not present

## 2022-05-21 DIAGNOSIS — Z79899 Other long term (current) drug therapy: Secondary | ICD-10-CM | POA: Diagnosis not present

## 2022-05-21 DIAGNOSIS — G8929 Other chronic pain: Secondary | ICD-10-CM | POA: Diagnosis not present

## 2022-05-22 ENCOUNTER — Ambulatory Visit: Payer: Medicare Other | Admitting: Critical Care Medicine

## 2022-05-22 ENCOUNTER — Ambulatory Visit (AMBULATORY_SURGERY_CENTER): Payer: Medicare Other | Admitting: Gastroenterology

## 2022-05-22 ENCOUNTER — Encounter: Payer: Self-pay | Admitting: Gastroenterology

## 2022-05-22 VITALS — BP 139/93 | HR 77 | Temp 97.7°F | Resp 20 | Ht 68.0 in | Wt 160.0 lb

## 2022-05-22 DIAGNOSIS — K2970 Gastritis, unspecified, without bleeding: Secondary | ICD-10-CM

## 2022-05-22 DIAGNOSIS — R933 Abnormal findings on diagnostic imaging of other parts of digestive tract: Secondary | ICD-10-CM

## 2022-05-22 DIAGNOSIS — R1310 Dysphagia, unspecified: Secondary | ICD-10-CM

## 2022-05-22 MED ORDER — SODIUM CHLORIDE 0.9 % IV SOLN: 500.0000 mL | Freq: Once | INTRAVENOUS | Status: DC

## 2022-05-22 NOTE — Progress Notes (Signed)
Called to room to assist during endoscopic procedure.  Patient ID and intended procedure confirmed with present staff. Received instructions for my participation in the procedure from the performing physician.

## 2022-05-22 NOTE — Patient Instructions (Signed)
Please read handouts provided. Continue present medications. Await pathology results. Repeat upper endoscopy as needed. Resume previous diet.   YOU HAD AN ENDOSCOPIC PROCEDURE TODAY AT [facility]:   Refer to the procedure report that was given to you for any specific questions about what was found during the examination.  If the procedure report does not answer your questions, please call your gastroenterologist to clarify.  If you requested that your care partner not be given the details of your procedure findings, then the procedure report has been included in a sealed envelope for you to review at your convenience later.  YOU SHOULD EXPECT: Some feelings of bloating in the abdomen. Passage of more gas than usual.  Walking can help get rid of the air that was put into your GI tract during the procedure and reduce the bloating. If you had a lower endoscopy (such as a colonoscopy or flexible sigmoidoscopy) you may notice spotting of blood in your stool or on the toilet paper. If you underwent a bowel prep for your procedure, you may not have a normal bowel movement for a few days.  Please Note:  You might notice some irritation and congestion in your nose or some drainage.  This is from the oxygen used during your procedure.  There is no need for concern and it should clear up in a [place] or so.  SYMPTOMS TO REPORT IMMEDIATELY:  Following upper endoscopy (EGD)  Vomiting of blood or coffee ground material  New chest pain or pain under the shoulder blades  Painful or persistently difficult swallowing  New shortness of breath  Fever of 100F or higher  Black, tarry-looking stools  For urgent or emergent issues, a gastroenterologist can be reached at any hour by calling [phone]. Do not use MyChart messaging for urgent concerns.    DIET:  We do recommend a small meal at first, but then you may proceed to your regular diet.  Drink plenty of fluids but you should avoid  alcoholic beverages for 24 hours.  ACTIVITY:  You should plan to take it easy for the rest of today and you should NOT DRIVE or use heavy machinery until tomorrow (because of the sedation medicines used during the test).    FOLLOW UP: Our staff will call the number listed on your records 24-72 hours following your procedure to check on you and address any questions or concerns that you may have regarding the information given to you following your procedure. If we do not reach you, we will leave a message.  We will attempt to reach you two times.  During this call, we will ask if you have developed any symptoms of COVID 19. If you develop any symptoms (ie: fever, flu-like symptoms, shortness of breath, cough etc.) before then, please call [phone].  If you test positive for Covid 19 in the 2 weeks post procedure, please call and report this information to [place].    If any biopsies were taken you will be contacted by phone or by letter within the next 1-3 weeks.  Please call us at [phone] if you have not heard about the biopsies in 3 weeks.    SIGNATURES/CONFIDENTIALITY: You and/or your care partner have signed paperwork which will be entered into your electronic medical record.  These signatures attest to the fact that that the information above on your After Visit Summary has been reviewed and is understood.  Full responsibility of the confidentiality of this discharge information lies  with you and/or your care-partner.

## 2022-05-22 NOTE — Op Note (Signed)
[facility] Patient Name: Wesley [name] Procedure Date: 05/22/2022 10:07 AM MRN: [id number] Endoscopist: Gerrit Heck , MD Age: 56 Referring MD:  Date of Birth: 09/23/1966 Gender: Male Account #: [id number] Procedure:                Upper GI endoscopy Indications:              Dysphagia, Abnormal esophagram Medicines:                Monitored Anesthesia Care Procedure:                Pre-Anesthesia Assessment:                           - Prior to the procedure, a History and Physical                            was performed, and patient medications and                            allergies were reviewed. The patient's tolerance of                            previous anesthesia was also reviewed. The risks                            and benefits of the procedure and the sedation                            options and risks were discussed with the patient.                            All questions were answered, and informed consent                            was obtained. Prior Anticoagulants: The patient has                            taken no previous anticoagulant or antiplatelet                            agents. ASA Grade Assessment: III - A patient with                            severe systemic disease. After reviewing the risks                            and benefits, the patient was deemed in                            satisfactory condition to undergo the procedure.                           After obtaining informed consent, the endoscope was  passed under direct vision. Throughout the                            procedure, the patient's blood pressure, pulse, and                            oxygen saturations were monitored continuously. The                            GIF [id number] #[id number] was introduced through the                            mouth, and advanced to the second part of duodenum.                            The upper GI endoscopy was  accomplished without                            difficulty. The patient tolerated the procedure                            well. Scope In: Scope Out: Findings:                 The examined esophagus was normal. Due to the                            symptoms of dysphagia and abnormal esophagram, the                            decision was made to perform esophageal dilation. A                            guidewire was placed and the scope was withdrawn.                            Dilation was performed with a Savary dilator with                            no resistance at 17 mm. The dilation site was                            examined following endoscope reinsertion and showed                            no bleeding, mucosal tear or perforation. Estimated                            blood loss: none.                           The Z-line was regular and was found 44 cm from the  incisors.                           Localized mild inflammation characterized by                            erythema was found in the gastric antrum. Biopsies                            were taken with a cold forceps for Helicobacter                            pylori testing. Estimated blood loss was minimal.                           The examined duodenum was normal. Complications:            No immediate complications. Estimated Blood Loss:     Estimated blood loss was minimal. Impression:               - Normal esophagus. Dilated with 17 mm Savary                            dilator.                           - Z-line regular, 44 cm from the incisors.                           - Mild, non-ulcer gastritis. Biopsied.                           - Normal examined duodenum. Recommendation:           - Patient has a contact number available for                            emergencies. The signs and symptoms of potential                            delayed complications were discussed with the                             patient. Return to normal activities tomorrow.                            Written discharge instructions were provided to the                            patient.                           - Resume previous diet.                           - Continue present medications.                           -  Await pathology results.                           - Repeat upper endoscopy PRN. Gerrit Heck, MD 05/22/2022 10:30:01 AM

## 2022-05-22 NOTE — Progress Notes (Signed)
Report given to PACU, vss 
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
## 2022-05-23 ENCOUNTER — Telehealth: Payer: Self-pay | Admitting: *Deleted

## 2022-05-27 ENCOUNTER — Other Ambulatory Visit: Payer: Self-pay

## 2022-05-28 ENCOUNTER — Other Ambulatory Visit: Payer: Self-pay | Admitting: Pharmacist

## 2022-05-28 ENCOUNTER — Other Ambulatory Visit: Payer: Self-pay

## 2022-05-28 MED ORDER — PANCRELIPASE (LIP-PROT-AMYL) 36000-114000 UNITS PO CPEP: ORAL_CAPSULE | ORAL | 2 refills | Status: DC

## 2022-06-02 ENCOUNTER — Other Ambulatory Visit: Payer: Self-pay

## 2022-06-11 ENCOUNTER — Ambulatory Visit: Payer: Medicare Other | Attending: Critical Care Medicine | Admitting: Critical Care Medicine

## 2022-06-11 ENCOUNTER — Encounter: Payer: Self-pay | Admitting: Critical Care Medicine

## 2022-06-11 ENCOUNTER — Other Ambulatory Visit: Payer: Self-pay

## 2022-06-11 VITALS — BP 123/82 | HR 102 | Ht 68.0 in | Wt 163.2 lb

## 2022-06-11 DIAGNOSIS — F17200 Nicotine dependence, unspecified, uncomplicated: Secondary | ICD-10-CM | POA: Diagnosis not present

## 2022-06-11 DIAGNOSIS — R1314 Dysphagia, pharyngoesophageal phase: Secondary | ICD-10-CM

## 2022-06-11 DIAGNOSIS — J449 Chronic obstructive pulmonary disease, unspecified: Secondary | ICD-10-CM

## 2022-06-11 DIAGNOSIS — E440 Moderate protein-calorie malnutrition: Secondary | ICD-10-CM | POA: Diagnosis not present

## 2022-06-11 DIAGNOSIS — B370 Candidal stomatitis: Secondary | ICD-10-CM | POA: Diagnosis not present

## 2022-06-11 DIAGNOSIS — I700 Atherosclerosis of aorta: Secondary | ICD-10-CM | POA: Diagnosis not present

## 2022-06-11 DIAGNOSIS — F17210 Nicotine dependence, cigarettes, uncomplicated: Secondary | ICD-10-CM | POA: Diagnosis not present

## 2022-06-11 DIAGNOSIS — D72825 Bandemia: Secondary | ICD-10-CM

## 2022-06-11 MED ORDER — FLUCONAZOLE 100 MG PO TABS
100.0000 mg | ORAL_TABLET | Freq: Every day | ORAL | 0 refills | Status: DC
  Filled 2022-06-11: qty 7, 7d supply, fill #0

## 2022-06-11 MED ORDER — FLUTICASONE FUROATE-VILANTEROL 200-25 MCG/ACT IN AEPB
1.0000 | INHALATION_SPRAY | Freq: Every day | RESPIRATORY_TRACT | 2 refills | Status: DC
  Filled 2022-06-11: qty 60, 30d supply, fill #0
  Filled 2022-10-16: qty 60, 30d supply, fill #1
  Filled 2022-11-13: qty 60, 30d supply, fill #2

## 2022-06-11 MED ORDER — FLUCONAZOLE 100 MG PO TABS
ORAL_TABLET | ORAL | 0 refills | Status: DC
  Filled 2022-06-11: qty 7, 6d supply, fill #0
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
## 2022-06-11 NOTE — Assessment & Plan Note (Signed)
Marland Kitchen  Current smoking consumption amount: 1 pack a [place]  . Dicsussion on advise to quit smoking and smoking impacts: Impacts on lung health and pancreatic health  . Patient's willingness to quit: Uncertain  . Methods to quit smoking discussed: Behavioral modification nicotine replacement  Medication management of smoking session drugs discussed: Advised to pick up nicotine d lozenge . Resources provided:  AVS   . Setting quit date not established  . Follow-up arranged 4 months   Time spent counseling the patient: 5 minutes

## 2022-06-11 NOTE — Patient Instructions (Signed)
Start Breo 1 puff daily and discontinue Symbicort  Please stop smoking use nicotine lozenge 4 mg take this 3 times daily to reduce tobacco intake  Diflucan yeast medication sent to pharmacy for oral yeast infection  No other medication changes  Return to see Dr. Joya Gaskins 3 months

## 2022-06-11 NOTE — Assessment & Plan Note (Signed)
Resolved on follow-up blood counts

## 2022-06-11 NOTE — Assessment & Plan Note (Signed)
Has PAD and aortic atherosclerosis  No need for statins d/t liver disease and LDL 43  03/2022

## 2022-06-11 NOTE — Assessment & Plan Note (Signed)
Prescribe a course of fluconazole

## 2022-06-11 NOTE — Assessment & Plan Note (Signed)
Resolved with dilation from GI

## 2022-06-11 NOTE — Assessment & Plan Note (Signed)
Resolved on recent lab testing

## 2022-06-11 NOTE — Assessment & Plan Note (Signed)
The patient no longer can get Symbicort due to insurance coverage change we will switch to Breo dry powdered inhaler instructed him as to proper use of the DPI

## 2022-06-12 ENCOUNTER — Other Ambulatory Visit: Payer: Self-pay

## 2022-06-18 ENCOUNTER — Other Ambulatory Visit: Payer: Self-pay

## 2022-06-19 ENCOUNTER — Other Ambulatory Visit: Payer: Self-pay

## 2022-06-20 DIAGNOSIS — G8929 Other chronic pain: Secondary | ICD-10-CM | POA: Diagnosis not present

## 2022-06-20 DIAGNOSIS — M545 Low back pain, unspecified: Secondary | ICD-10-CM | POA: Diagnosis not present

## 2022-06-20 DIAGNOSIS — Z79891 Long term (current) use of opiate analgesic: Secondary | ICD-10-CM | POA: Diagnosis not present

## 2022-06-20 DIAGNOSIS — F17210 Nicotine dependence, cigarettes, uncomplicated: Secondary | ICD-10-CM | POA: Diagnosis not present

## 2022-06-20 DIAGNOSIS — R1013 Epigastric pain: Secondary | ICD-10-CM | POA: Diagnosis not present

## 2022-06-20 DIAGNOSIS — Z79899 Other long term (current) drug therapy: Secondary | ICD-10-CM | POA: Diagnosis not present

## 2022-06-28 ENCOUNTER — Other Ambulatory Visit: Payer: Self-pay | Admitting: Critical Care Medicine

## 2022-06-30 NOTE — Telephone Encounter (Signed)
Refilled 04/24/2022 #90 1 refill. Requested Prescriptions  Pending Prescriptions Disp Refills  . tamsulosin (FLOMAX) 0.4 MG CAPS capsule [Pharmacy Med Name: Tamsulosin HCl 0.4 MG Oral Capsule] 100 capsule 2    Sig: TAKE 1 CAPSULE BY MOUTH DAILY     Urology: Alpha-Adrenergic Blocker Passed - 06/28/2022 10:21 PM      Passed - PSA in normal range and within 360 days    Prostate Specific Ag, Serum  Date Value Ref Range Status  03/20/2022 2.5 0.0 - 4.0 ng/mL Final    Comment:    Roche ECLIA methodology. According to the American Urological Association, Serum PSA should decrease and remain at undetectable levels after radical prostatectomy. The AUA defines biochemical recurrence as an initial PSA value 0.2 ng/mL or greater followed by a subsequent confirmatory PSA value 0.2 ng/mL or greater. Values obtained with different assay methods or kits cannot be used interchangeably. Results cannot be interpreted as absolute evidence of the presence or absence of malignant disease.          Passed - Last BP in normal range    BP Readings from Last 1 Encounters:  06/11/22 123/82         Passed - Valid encounter within last 12 months    Recent Outpatient Visits          2 weeks ago COPD with chronic bronchitis ([place])   [place] [name], MD   2 months ago [place] [name], MD   3 months ago Strep throat   [place] [name], MD   7 months ago Neuropathic pain   [place] [name], MD   9 months ago Idiopathic chronic pancreatitis [facility])   [place] [name], MD      Future Appointments            In 2 months [name], MD [place]

## 2022-07-14 ENCOUNTER — Ambulatory Visit: Payer: Self-pay

## 2022-07-14 NOTE — Patient Outreach (Signed)
  Care Coordination   07/14/2022 Name: Wesley [name] [place] MRN: [id number] DOB: 27-Sep-1966   Care Coordination Outreach Attempts:  An unsuccessful telephone outreach was attempted today to offer the patient information about available care coordination services as a benefit of their health plan.   Follow Up Plan:  Additional outreach attempts will be made to offer the patient care coordination information and services.   Encounter Outcome:  No Answer  Care Coordination Interventions Activated:  No   Care Coordination Interventions:  No, not indicated    Daneen Schick, BSW, CDP Social Worker, Certified Dementia Practitioner Care Coordination [phone]

## 2022-07-21 ENCOUNTER — Ambulatory Visit: Payer: Self-pay

## 2022-07-21 ENCOUNTER — Other Ambulatory Visit: Payer: Self-pay

## 2022-07-21 NOTE — Patient Outreach (Signed)
  Care Coordination   07/21/2022 Name: Greogry Goodwyn Bellucci MRN: [id number] DOB: February 11, 1966   Care Coordination Outreach Attempts:  A second unsuccessful outreach was attempted today to offer the patient with information about available care coordination services as a benefit of their health plan.     Follow Up Plan:  Additional outreach attempts will be made to offer the patient care coordination information and services.   Encounter Outcome:  No Answer  Care Coordination Interventions Activated:  No   Care Coordination Interventions:  No, not indicated    Daneen Schick, BSW, CDP Social Worker, Certified Dementia Practitioner Care Coordination [phone]

## 2022-07-28 ENCOUNTER — Other Ambulatory Visit: Payer: Self-pay

## 2022-07-28 ENCOUNTER — Ambulatory Visit: Payer: Self-pay

## 2022-07-28 NOTE — Patient Outreach (Signed)
  Care Coordination   07/28/2022 Name: Wesley [name] MRN: [id number] DOB: 1966/04/11   Care Coordination Outreach Attempts:  A third unsuccessful outreach was attempted today to offer the patient with information about available care coordination services as a benefit of their health plan.   Follow Up Plan:  No further outreach attempts will be made at this time. We have been unable to contact the patient to offer or enroll patient in care coordination services  Encounter Outcome:  Pt. Request to Call Back  Care Coordination Interventions Activated:  No   Care Coordination Interventions:  No, not indicated    Daneen Schick, BSW, CDP Social Worker, Certified Dementia Practitioner Care Coordination [phone]

## 2022-07-29 DIAGNOSIS — G8929 Other chronic pain: Secondary | ICD-10-CM | POA: Diagnosis not present

## 2022-07-29 DIAGNOSIS — F17210 Nicotine dependence, cigarettes, uncomplicated: Secondary | ICD-10-CM | POA: Diagnosis not present

## 2022-07-29 DIAGNOSIS — R1013 Epigastric pain: Secondary | ICD-10-CM | POA: Diagnosis not present

## 2022-07-29 DIAGNOSIS — Z79899 Other long term (current) drug therapy: Secondary | ICD-10-CM | POA: Diagnosis not present

## 2022-07-29 DIAGNOSIS — M545 Low back pain, unspecified: Secondary | ICD-10-CM | POA: Diagnosis not present

## 2022-07-29 DIAGNOSIS — Z79891 Long term (current) use of opiate analgesic: Secondary | ICD-10-CM | POA: Diagnosis not present

## 2022-07-30 ENCOUNTER — Other Ambulatory Visit: Payer: Self-pay

## 2022-08-08 DIAGNOSIS — H524 Presbyopia: Secondary | ICD-10-CM | POA: Diagnosis not present

## 2022-08-13 ENCOUNTER — Other Ambulatory Visit (HOSPITAL_COMMUNITY): Payer: Self-pay

## 2022-08-13 ENCOUNTER — Emergency Department (EMERGENCY_DEPARTMENT_HOSPITAL)
Admission: EM | Admit: 2022-08-13 | Discharge: 2022-08-13 | Disposition: A | Discharge status: Home / Self Care | Payer: Medicare Other | Source: Home / Self Care | Attending: Emergency Medicine | Admitting: Emergency Medicine

## 2022-08-13 ENCOUNTER — Other Ambulatory Visit: Payer: Self-pay

## 2022-08-13 ENCOUNTER — Ambulatory Visit: Payer: Medicare Other | Admitting: Critical Care Medicine

## 2022-08-13 ENCOUNTER — Emergency Department (HOSPITAL_COMMUNITY): Payer: Medicare Other

## 2022-08-13 ENCOUNTER — Other Ambulatory Visit: Payer: Self-pay | Admitting: Critical Care Medicine

## 2022-08-13 DIAGNOSIS — R770 Abnormality of albumin: Secondary | ICD-10-CM | POA: Insufficient documentation

## 2022-08-13 DIAGNOSIS — Z800 Family history of malignant neoplasm of digestive organs: Secondary | ICD-10-CM | POA: Diagnosis not present

## 2022-08-13 DIAGNOSIS — Z86718 Personal history of other venous thrombosis and embolism: Secondary | ICD-10-CM | POA: Diagnosis not present

## 2022-08-13 DIAGNOSIS — Z79899 Other long term (current) drug therapy: Secondary | ICD-10-CM | POA: Diagnosis not present

## 2022-08-13 DIAGNOSIS — R748 Abnormal levels of other serum enzymes: Secondary | ICD-10-CM | POA: Insufficient documentation

## 2022-08-13 DIAGNOSIS — S36029A Unspecified contusion of spleen, initial encounter: Secondary | ICD-10-CM | POA: Diagnosis not present

## 2022-08-13 DIAGNOSIS — R9431 Abnormal electrocardiogram [ECG] [EKG]: Secondary | ICD-10-CM | POA: Diagnosis not present

## 2022-08-13 DIAGNOSIS — G629 Polyneuropathy, unspecified: Secondary | ICD-10-CM | POA: Diagnosis not present

## 2022-08-13 DIAGNOSIS — Z820 Family history of epilepsy and other diseases of the nervous system: Secondary | ICD-10-CM | POA: Diagnosis not present

## 2022-08-13 DIAGNOSIS — D72829 Elevated white blood cell count, unspecified: Secondary | ICD-10-CM | POA: Insufficient documentation

## 2022-08-13 DIAGNOSIS — S3600XA Unspecified injury of spleen, initial encounter: Secondary | ICD-10-CM | POA: Diagnosis present

## 2022-08-13 DIAGNOSIS — E876 Hypokalemia: Secondary | ICD-10-CM | POA: Insufficient documentation

## 2022-08-13 DIAGNOSIS — F17210 Nicotine dependence, cigarettes, uncomplicated: Secondary | ICD-10-CM | POA: Diagnosis not present

## 2022-08-13 DIAGNOSIS — K219 Gastro-esophageal reflux disease without esophagitis: Secondary | ICD-10-CM | POA: Diagnosis not present

## 2022-08-13 DIAGNOSIS — R109 Unspecified abdominal pain: Secondary | ICD-10-CM | POA: Diagnosis not present

## 2022-08-13 DIAGNOSIS — R112 Nausea with vomiting, unspecified: Secondary | ICD-10-CM | POA: Insufficient documentation

## 2022-08-13 DIAGNOSIS — E700 Classical phenylketonuria: Secondary | ICD-10-CM | POA: Insufficient documentation

## 2022-08-13 DIAGNOSIS — Z560 Unemployment, unspecified: Secondary | ICD-10-CM | POA: Diagnosis not present

## 2022-08-13 DIAGNOSIS — F1021 Alcohol dependence, in remission: Secondary | ICD-10-CM | POA: Diagnosis present

## 2022-08-13 DIAGNOSIS — Z7951 Long term (current) use of inhaled steroids: Secondary | ICD-10-CM | POA: Diagnosis not present

## 2022-08-13 DIAGNOSIS — K5900 Constipation, unspecified: Secondary | ICD-10-CM | POA: Diagnosis not present

## 2022-08-13 DIAGNOSIS — Z8249 Family history of ischemic heart disease and other diseases of the circulatory system: Secondary | ICD-10-CM | POA: Diagnosis not present

## 2022-08-13 DIAGNOSIS — R110 Nausea: Secondary | ICD-10-CM

## 2022-08-13 DIAGNOSIS — Y92008 Other place in unspecified non-institutional (private) residence as the place of occurrence of the external cause: Secondary | ICD-10-CM | POA: Diagnosis not present

## 2022-08-13 DIAGNOSIS — D735 Infarction of spleen: Secondary | ICD-10-CM | POA: Diagnosis not present

## 2022-08-13 DIAGNOSIS — R1013 Epigastric pain: Secondary | ICD-10-CM | POA: Insufficient documentation

## 2022-08-13 DIAGNOSIS — J439 Emphysema, unspecified: Secondary | ICD-10-CM | POA: Diagnosis not present

## 2022-08-13 DIAGNOSIS — Z23 Encounter for immunization: Secondary | ICD-10-CM | POA: Diagnosis not present

## 2022-08-13 DIAGNOSIS — R197 Diarrhea, unspecified: Secondary | ICD-10-CM | POA: Insufficient documentation

## 2022-08-13 DIAGNOSIS — R1012 Left upper quadrant pain: Secondary | ICD-10-CM | POA: Diagnosis not present

## 2022-08-13 DIAGNOSIS — B182 Chronic viral hepatitis C: Secondary | ICD-10-CM | POA: Diagnosis present

## 2022-08-13 DIAGNOSIS — G894 Chronic pain syndrome: Secondary | ICD-10-CM | POA: Diagnosis not present

## 2022-08-13 DIAGNOSIS — Z20822 Contact with and (suspected) exposure to covid-19: Secondary | ICD-10-CM | POA: Insufficient documentation

## 2022-08-13 DIAGNOSIS — I700 Atherosclerosis of aorta: Secondary | ICD-10-CM | POA: Diagnosis not present

## 2022-08-13 DIAGNOSIS — N400 Enlarged prostate without lower urinary tract symptoms: Secondary | ICD-10-CM | POA: Diagnosis present

## 2022-08-13 DIAGNOSIS — K861 Other chronic pancreatitis: Secondary | ICD-10-CM | POA: Diagnosis not present

## 2022-08-13 DIAGNOSIS — Z8051 Family history of malignant neoplasm of kidney: Secondary | ICD-10-CM | POA: Diagnosis not present

## 2022-08-13 DIAGNOSIS — D733 Abscess of spleen: Secondary | ICD-10-CM | POA: Diagnosis not present

## 2022-08-13 DIAGNOSIS — Z803 Family history of malignant neoplasm of breast: Secondary | ICD-10-CM | POA: Diagnosis not present

## 2022-08-13 DIAGNOSIS — R0602 Shortness of breath: Secondary | ICD-10-CM | POA: Diagnosis not present

## 2022-08-13 DIAGNOSIS — W1830XA Fall on same level, unspecified, initial encounter: Secondary | ICD-10-CM | POA: Diagnosis present

## 2022-08-13 DIAGNOSIS — M48061 Spinal stenosis, lumbar region without neurogenic claudication: Secondary | ICD-10-CM | POA: Diagnosis not present

## 2022-08-13 DIAGNOSIS — J449 Chronic obstructive pulmonary disease, unspecified: Secondary | ICD-10-CM | POA: Diagnosis not present

## 2022-08-13 LAB — CBC WITH DIFFERENTIAL/PLATELET
Abs Immature Granulocytes: 0.07 10*3/uL (ref 0.00–0.07)
Basophils Absolute: 0.1 10*3/uL (ref 0.0–0.1)
Basophils Relative: 1 %
Eosinophils Absolute: 0.2 10*3/uL (ref 0.0–0.5)
Eosinophils Relative: 1 %
HCT: 45.4 % (ref 39.0–52.0)
Hemoglobin: 15.8 g/dL (ref 13.0–17.0)
Immature Granulocytes: 1 %
Lymphocytes Relative: 16 %
Lymphs Abs: 2.1 10*3/uL (ref 0.7–4.0)
MCH: 31.6 pg (ref 26.0–34.0)
MCHC: 34.8 g/dL (ref 30.0–36.0)
MCV: 90.8 fL (ref 80.0–100.0)
Monocytes Absolute: 1.9 10*3/uL — ABNORMAL HIGH (ref 0.1–1.0)
Monocytes Relative: 14 %
Neutro Abs: 9 10*3/uL — ABNORMAL HIGH (ref 1.7–7.7)
Neutrophils Relative %: 67 %
Platelets: 397 10*3/uL (ref 150–400)
RBC: 5 MIL/uL (ref 4.22–5.81)
RDW: 17.6 % — ABNORMAL HIGH (ref 11.5–15.5)
WBC: 13.3 10*3/uL — ABNORMAL HIGH (ref 4.0–10.5)
nRBC: 0 % (ref 0.0–0.2)

## 2022-08-13 LAB — LIPASE, BLOOD: Lipase: 126 U/L — ABNORMAL HIGH (ref 11–51)

## 2022-08-13 LAB — URINALYSIS, ROUTINE W REFLEX MICROSCOPIC
Bacteria, UA: NONE SEEN
Bilirubin Urine: NEGATIVE
Glucose, UA: NEGATIVE mg/dL
Hgb urine dipstick: NEGATIVE
Ketones, ur: 80 mg/dL — AB
Leukocytes,Ua: NEGATIVE
Nitrite: NEGATIVE
Protein, ur: 30 mg/dL — AB
Specific Gravity, Urine: 1.023 (ref 1.005–1.030)
pH: 5 (ref 5.0–8.0)

## 2022-08-13 LAB — HEMOGLOBIN AND HEMATOCRIT, BLOOD
HCT: 43.2 % (ref 39.0–52.0)
Hemoglobin: 14.8 g/dL (ref 13.0–17.0)

## 2022-08-13 LAB — COMPREHENSIVE METABOLIC PANEL
ALT: 19 U/L (ref 0–44)
AST: 21 U/L (ref 15–41)
Albumin: 2.9 g/dL — ABNORMAL LOW (ref 3.5–5.0)
Alkaline Phosphatase: 105 U/L (ref 38–126)
Anion gap: 15 (ref 5–15)
BUN: 5 mg/dL — ABNORMAL LOW (ref 6–20)
CO2: 25 mmol/L (ref 22–32)
Calcium: 9.3 mg/dL (ref 8.9–10.3)
Chloride: 99 mmol/L (ref 98–111)
Creatinine, Ser: 1.1 mg/dL (ref 0.61–1.24)
GFR, Estimated: >60 mL/min (ref >60)
Glucose, Bld: 112 mg/dL — ABNORMAL HIGH (ref 70–99)
Potassium: 3 mmol/L — ABNORMAL LOW (ref 3.5–5.1)
Sodium: 139 mmol/L (ref 135–145)
Total Bilirubin: 1.3 mg/dL — ABNORMAL HIGH (ref 0.3–1.2)
Total Protein: 8.1 g/dL (ref 6.5–8.1)

## 2022-08-13 LAB — RESP PANEL BY RT-PCR (FLU A&B, COVID) ARPGX2
Influenza A by PCR: NEGATIVE
Influenza B by PCR: NEGATIVE
SARS Coronavirus 2 by RT PCR: NEGATIVE

## 2022-08-13 LAB — LACTIC ACID, PLASMA: Lactic Acid, Venous: 0.9 mmol/L (ref 0.5–1.9)

## 2022-08-13 LAB — MAGNESIUM: Magnesium: 1.9 mg/dL (ref 1.7–2.4)

## 2022-08-13 MED ORDER — ONDANSETRON HCL 4 MG PO TABS
4.0000 mg | ORAL_TABLET | Freq: Three times a day (TID) | ORAL | 1 refills | Status: DC | PRN
  Filled 2022-08-13: qty 20, 7d supply, fill #0
  Filled 2022-09-05: qty 20, 7d supply, fill #1

## 2022-08-13 MED ORDER — IOHEXOL 350 MG/ML SOLN
75.0000 mL | Freq: Once | INTRAVENOUS | Status: AC | PRN
  Administered 2022-08-13: 75 mL via INTRAVENOUS

## 2022-08-13 MED ORDER — MORPHINE SULFATE (PF) 4 MG/ML IV SOLN
4.0000 mg | Freq: Once | INTRAVENOUS | Status: AC
  Administered 2022-08-13: 4 mg via INTRAVENOUS
  Filled 2022-08-13: qty 1

## 2022-08-13 MED ORDER — OXYCODONE-ACETAMINOPHEN 5-325 MG PO TABS
2.0000 | ORAL_TABLET | Freq: Once | ORAL | Status: AC
  Administered 2022-08-13: 2 via ORAL
  Filled 2022-08-13: qty 2

## 2022-08-13 MED ORDER — AMOXICILLIN-POT CLAVULANATE 875-125 MG PO TABS
1.0000 | ORAL_TABLET | Freq: Two times a day (BID) | ORAL | 0 refills | Status: DC
  Filled 2022-08-13 (×2): qty 14, 7d supply, fill #0

## 2022-08-13 MED ORDER — POTASSIUM CHLORIDE CRYS ER 20 MEQ PO TBCR
40.0000 meq | EXTENDED_RELEASE_TABLET | Freq: Once | ORAL | Status: AC
  Administered 2022-08-13: 40 meq via ORAL
  Filled 2022-08-13: qty 2

## 2022-08-13 MED ORDER — SODIUM CHLORIDE 0.9 % IV BOLUS
1000.0000 mL | Freq: Once | INTRAVENOUS | Status: AC
  Administered 2022-08-13: 1000 mL via INTRAVENOUS

## 2022-08-13 MED ORDER — ONDANSETRON HCL 4 MG/2ML IJ SOLN
4.0000 mg | Freq: Once | INTRAMUSCULAR | Status: AC
  Administered 2022-08-13: 4 mg via INTRAVENOUS
  Filled 2022-08-13: qty 2

## 2022-08-13 MED ORDER — HYDROMORPHONE HCL 1 MG/ML IJ SOLN
1.0000 mg | Freq: Once | INTRAMUSCULAR | Status: AC
  Administered 2022-08-13: 1 mg via INTRAVENOUS
  Filled 2022-08-13: qty 1

## 2022-08-13 MED ORDER — PIPERACILLIN-TAZOBACTAM 3.375 G IVPB: 3.3750 g | Freq: Three times a day (TID) | INTRAVENOUS | Status: DC

## 2022-08-13 NOTE — ED Provider Notes (Addendum)
Care of the patient received from [facility].  Please see her note for full HPI.  In short, 56 year old male with a history of pancreatitis, alcohol abuse, chronic hep C, GERD, COPD presents to the ER with complaints of left-sided abdominal pain.  He had some associated nausea and emesis and diarrhea.  No fevers.  Pain is worse when he was laying down.  There was some concern for pancreatitis, his lipase was elevated at 126 but not exactly consistent with pancreatitis.  His lab work showed a leukocytosis of 13.3, potassium of 3, total bilirubin of 1.3.  UA not consistent with UTI.  Chest x-ray with bolus emphysema, unchanged from his prior CT scan.  Patient denies any increase in shortness of breath.  Care signed out pending CT scan of the abdomen.  I reviewed this, agree with radiology read.  Findings as   IMPRESSION:  Multiple subcapsular splenic fluid collections are noted, the  largest measuring 6.9 x 2.7 cm. It is uncertain if these represent  old hematomas or possibly abscesses. There is noted abnormal low  density involving the inferior portion of the spleen which may  represent laceration or possibly infarction.    Mild bilateral posterior basilar subsegmental atelectasis is noted.    Small fat containing right inguinal hernia.   After further discussion, the patient does endorse a fall on Saturday 9/9.  He states that he was trying to move a dresser and ended up falling into it, with the impact being in his left upper quadrant.  This would explain the possible hematoma.  He does have a slight leukocytosis but is afebrile in the ER.  His hemoglobin is normal despite the fall being 5 days out, repeat is stable.   Discussed the CT scan findings with Dr. Bobbye Morton with surgery.  Given that the fall happened 5 days ago, as well as a stable hemoglobin, low concern for continuous hemorrhage.  She recommends discharge with pain control and PCP follow-up.  Patient was educated on CT scan  findings.  Patient does endorse chills and diaphoresis over the last several days.  It is difficult to delineate whether his symptoms are secondary to the hematoma or an infection.  He does have a leukocytosis here but is not febrile.  However given he is endorsing infectious symptoms, COVID and flu are negative, I think it would be prudent to admit him for IV antibiotics and possible ID consultation.  Lactic acid pending.  Consulted hospitalist for admission.  Evaluated by hospitalist who felt the patient was stable to go home.  Recommending 1 week of Augmentin, prescription sent.  Again reiterated strict return precautions and PCP follow-up.  He was counseled on pain control, recommending Tylenol, ibuprofen and his home oxycodone (PDMP reviewed, script filled on 9/7.  Patient voiced understanding and is agreeable.  Stable for discharge.  Case discussed w/ Dr. Nechama Guard who is agreeable to the above plan and disposition    Results for orders placed or performed during the hospital encounter of 08/13/22  Resp Panel by RT-PCR (Flu A&B, Covid) Anterior Nasal Swab   Specimen: Anterior Nasal Swab  Result Value Ref Range   SARS Coronavirus 2 by RT PCR NEGATIVE NEGATIVE   Influenza A by PCR NEGATIVE NEGATIVE   Influenza B by PCR NEGATIVE NEGATIVE  Comprehensive metabolic panel  Result Value Ref Range   Sodium 139 135 - 145 mmol/L   Potassium 3.0 (L) 3.5 - 5.1 mmol/L   Chloride 99 98 - 111 mmol/L  CO2 25 22 - 32 mmol/L   Glucose, Bld 112 (H) 70 - 99 mg/dL   BUN 5 (L) 6 - 20 mg/dL   Creatinine, Ser 1.10 0.61 - 1.24 mg/dL   Calcium 9.3 8.9 - 10.3 mg/dL   Total Protein 8.1 6.5 - 8.1 g/dL   Albumin 2.9 (L) 3.5 - 5.0 g/dL   AST 21 15 - 41 U/L   ALT 19 0 - 44 U/L   Alkaline Phosphatase 105 38 - 126 U/L   Total Bilirubin 1.3 (H) 0.3 - 1.2 mg/dL   GFR, Estimated >60 >60 mL/min   Anion gap 15 5 - 15  CBC with Differential  Result Value Ref Range   WBC 13.3 (H) 4.0 - 10.5 K/uL   RBC 5.00 4.22 - 5.81  MIL/uL   Hemoglobin 15.8 13.0 - 17.0 g/dL   HCT 45.4 39.0 - 52.0 %   MCV 90.8 80.0 - 100.0 fL   MCH 31.6 26.0 - 34.0 pg   MCHC 34.8 30.0 - 36.0 g/dL   RDW 17.6 (H) 11.5 - 15.5 %   Platelets 397 150 - 400 K/uL   nRBC 0.0 0.0 - 0.2 %   Neutrophils Relative % 67 %   Neutro Abs 9.0 (H) 1.7 - 7.7 K/uL   Lymphocytes Relative 16 %   Lymphs Abs 2.1 0.7 - 4.0 K/uL   Monocytes Relative 14 %   Monocytes Absolute 1.9 (H) 0.1 - 1.0 K/uL   Eosinophils Relative 1 %   Eosinophils Absolute 0.2 0.0 - 0.5 K/uL   Basophils Relative 1 %   Basophils Absolute 0.1 0.0 - 0.1 K/uL   Immature Granulocytes 1 %   Abs Immature Granulocytes 0.07 0.00 - 0.07 K/uL  Lipase, blood  Result Value Ref Range   Lipase 126 (H) 11 - 51 U/L  Urinalysis, Routine w reflex microscopic  Result Value Ref Range   Color, Urine AMBER (A) YELLOW   APPearance HAZY (A) CLEAR   Specific Gravity, Urine 1.023 1.005 - 1.030   pH 5.0 5.0 - 8.0   Glucose, UA NEGATIVE NEGATIVE mg/dL   Hgb urine dipstick NEGATIVE NEGATIVE   Bilirubin Urine NEGATIVE NEGATIVE   Ketones, ur 80 (A) NEGATIVE mg/dL   Protein, ur 30 (A) NEGATIVE mg/dL   Nitrite NEGATIVE NEGATIVE   Leukocytes,Ua NEGATIVE NEGATIVE   RBC / HPF 0-5 0 - 5 RBC/hpf   WBC, UA 0-5 0 - 5 WBC/hpf   Bacteria, UA NONE SEEN NONE SEEN   Squamous Epithelial / LPF 0-5 0 - 5   Mucus PRESENT   Magnesium  Result Value Ref Range   Magnesium 1.9 1.7 - 2.4 mg/dL  Hemoglobin and hematocrit, blood  Result Value Ref Range   Hemoglobin 14.8 13.0 - 17.0 g/dL   HCT 43.2 39.0 - 52.0 %   CT Abdomen Pelvis W Contrast  Result Date: 08/13/2022 CLINICAL DATA:  Acute generalized abdominal pain. EXAM: CT ABDOMEN AND PELVIS WITH CONTRAST TECHNIQUE: Multidetector CT imaging of the abdomen and pelvis was performed using the standard protocol following bolus administration of intravenous contrast. RADIATION DOSE REDUCTION: This exam was performed according to the departmental dose-optimization program  which includes automated exposure control, adjustment of the mA and/or kV according to patient size and/or use of iterative reconstruction technique. CONTRAST:  79m OMNIPAQUE IOHEXOL 350 MG/ML SOLN COMPARISON:  November 15, 2021. FINDINGS: Lower chest: Mild bilateral posterior basilar subsegmental atelectasis is noted. Hepatobiliary: Status post cholecystectomy. Minimal intrahepatic and extrahepatic biliary dilatation is noted most  likely due to post cholecystectomy status. Stable hepatic cysts are noted. Pancreas: Unremarkable. No pancreatic ductal dilatation or surrounding inflammatory changes. Spleen: Multiple subcapsular fluid collections are noted, the largest measuring 6.9 x 2.7 cm, concerning for hematoma or possibly abscess. Low density is seen involving the inferior portion of the spleen concerning for possible laceration or infarction. Adrenals/Urinary Tract: Adrenal glands are unremarkable. Kidneys are normal, without renal calculi, focal lesion, or hydronephrosis. Bladder is unremarkable. Stomach/Bowel: Stomach is within normal limits. Appendix appears normal. No evidence of bowel wall thickening, distention, or inflammatory changes. Vascular/Lymphatic: Aortic atherosclerosis. No enlarged abdominal or pelvic lymph nodes. Reproductive: Prostate is unremarkable. Other: Small fat containing right inguinal hernia is noted. No ascites is noted. Musculoskeletal: Severe degenerative disc disease is noted at L5-S1 with sclerosis of the adjacent vertebral bodies. No acute osseous abnormality is noted. IMPRESSION: Multiple subcapsular splenic fluid collections are noted, the largest measuring 6.9 x 2.7 cm. It is uncertain if these represent old hematomas or possibly abscesses. There is noted abnormal low density involving the inferior portion of the spleen which may represent laceration or possibly infarction. Mild bilateral posterior basilar subsegmental atelectasis is noted. Small fat containing right inguinal  hernia. Aortic Atherosclerosis (ICD10-I70.0). Electronically Signed   By: Marijo Conception M.D.   On: 08/13/2022 09:06   DG Chest 2 View  Result Date: 08/13/2022 CLINICAL DATA:  56 year old male with shortness of breath. EXAM: CHEST - 2 VIEW COMPARISON:  Chest CT 04/17/2022 and earlier. FINDINGS: AP and lateral views at 0505 hours. The patient's arms are not raised on the lateral, limiting its utility. Upper lobe paraseptal and bullous emphysema better demonstrated on the May CT. Lower lung volumes now. Mediastinal contours remain within normal limits. No superimposed pneumothorax, pulmonary edema, pleural effusion, or consolidation identified. No acute osseous abnormality identified. Right upper quadrant cholecystectomy clips. Visible bowel gas pattern is within normal limits. IMPRESSION: Bullous Emphysema (ICD10-J43.9), as seen by CT in May. Lower lung volumes. No acute cardiopulmonary abnormality. Electronically Signed   By: Genevie Ann M.D.   On: 08/13/2022 05:51        Garald Balding, PA-C 08/13/22 1204    Elgie Congo, MD 08/13/22 1934
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
## 2022-08-13 NOTE — ED Notes (Signed)
All discharge instructions including follow up care and prescriptions reviewed with patient and patient verbalized understanding of same. Patient stable and ambulatory at time of discharge.

## 2022-08-13 NOTE — ED Provider Notes (Signed)
[facility] Provider Note   CSN: [id number] Arrival date & time: 08/13/22  6010     History  Chief Complaint  Patient presents with   Back Pain   Abdominal Pain    Wesley [name] is a 56 y.o. male with a history of prior pancreatitis, alcohol abuse, chronic hepatitis C, GERD, COPD, and prior abdominal surgeries including colostomy status post takedown and cholecystectomy who presents to the emergency department with complaints of abdominal pain for the past 3 days.  Pain is located to the epigastric area as well as the left side of the abdomen, radiates into his back, feels achy/sharp in nature, it is constant, worse when he lays in certain positions, no alleviating factors.  He had some associated nausea with emesis as well as diarrhea.  He also notes some cough, dyspnea, and subjective fever/chills.  He denies hematemesis, melena, hematochezia, dysuria, testicular pain/swelling, chest pain, or syncope.  Denies numbness or weakness in the legs or incontinence.  HPI     Home Medications Prior to Admission medications   Medication Sig Start Date End Date Taking? Authorizing Provider  albuterol (VENTOLIN HFA) 108 (90 Base) MCG/ACT inhaler Inhale 2 puffs into the lungs every 6 (six) hours as needed for wheezing or shortness of breath. 11/05/21   [name], MD  amitriptyline ([name]) 50 MG tablet Take 1 tablet (50 mg total) by mouth at bedtime. 03/20/22 06/18/22  [name], MD  baclofen (LIORESAL) 10 MG tablet Take 0.5-1 tablets (5-10 mg total) by mouth every 8 (eight) hours as needed. 03/20/22   [name], MD  DULoxetine (CYMBALTA) 60 MG capsule TAKE 1 CAPSULE BY MOUTH DAILY 05/22/22 05/22/23  [name], MD  fluconazole (DIFLUCAN) 100 MG tablet Take two once then one daily until gone 06/11/22   [name], MD  fluticasone furoate-vilanterol (BREO ELLIPTA) 200-25 MCG/ACT AEPB Inhale 1 puff into the lungs daily. 06/11/22    [name], MD  folic acid (FOLVITE) 1 MG tablet Take 1 tablet (1 mg total) by mouth daily. 03/20/22 03/20/23  [name], MD  lipase/protease/amylase ([name]) 36000 UNITS CPEP capsule Take 2 capsules (72,000 Units total) by mouth 3 (three) times daily with meals AND 1 capsule (36,000 Units total) with snacks. Max: 9 capsules per [place]. 05/28/22   [name], MD  Multiple Vitamin (MULTIVITAMIN WITH MINERALS) TABS tablet Spectravite Ultra Men 50 Plus 300 mcg-60 mcg-600 mcg-300 mcg tablet  TAKE 1 TABLET BY MOUTH EVERY [place]    [provider]  Multiple Vitamins-Minerals (COMPLETE) TABS daily.    [provider]  nicotine (NICODERM CQ - DOSED IN MG/24 HOURS) 21 mg/24hr patch Place 1 patch (21 mg total) onto the skin daily. 04/07/22   [name], MD  nicotine polacrilex (COMMIT) 4 MG lozenge TAKE THREE A [place] TO STOP SMOKING 09/17/21 09/17/22  [name], MD  ondansetron (ZOFRAN) 4 MG tablet Take 1 tablet (4 mg total) by mouth every 8 (eight) hours as needed for nausea or vomiting. 11/05/21 11/05/22  [name], MD  pantoprazole (PROTONIX) 40 MG tablet Take 1 tablet (40 mg total) by mouth 2 (two) times daily before a meal. 03/20/22 03/20/23  [name], MD  Polyethylene Glycol 3350 (MIRALAX PO) Take by mouth. PRN    [provider]  pregabalin (LYRICA) 150 MG capsule Take 1 capsule (150 mg total) by mouth in the morning, at noon, and at bedtime. 03/20/22  09/18/22  [name], MD  tamsulosin (FLOMAX) 0.4 MG CAPS capsule Take 1 capsule (0.4 mg total) by mouth daily. 04/24/22   [name], MD  thiamine 100 MG tablet TAKE 1 TABLET (100 MG TOTAL) BY MOUTH DAILY. 03/20/22 03/20/23  [name], MD  thiamine 100 MG tablet Take 1 tablet by mouth daily.    [provider]  vitamin B-12 (CYANOCOBALAMIN) 1000 MCG tablet Take 1 tablet (1,000 mcg total) by mouth daily. 03/20/22   [name], MD  Vitamin D, Ergocalciferol,  (DRISDOL) 1.25 MG (50000 UNIT) CAPS capsule Take 1 capsule (50,000 Units total) by mouth every 7 (seven) days. 03/20/22   [name], MD      Allergies    Patient has no known allergies.    Review of Systems   Review of Systems  Constitutional:  Positive for chills and fever.  Respiratory:  Positive for cough and shortness of breath.   Cardiovascular:  Negative for chest pain.  Gastrointestinal:  Positive for abdominal pain, diarrhea, nausea and vomiting. Negative for blood in stool.  Genitourinary:  Negative for dysuria, scrotal swelling and testicular pain.  Neurological:  Negative for syncope, weakness and numbness.  All other systems reviewed and are negative.   Physical Exam Updated Vital Signs There were no vitals taken for this visit. Physical Exam Vitals and nursing note reviewed.  Constitutional:      General: He is not in acute distress.    Appearance: He is well-developed. He is not toxic-appearing.  HENT:     Head: Normocephalic and atraumatic.  Eyes:     General:        Right eye: No discharge.        Left eye: No discharge.     Conjunctiva/sclera: Conjunctivae normal.  Cardiovascular:     Rate and Rhythm: Normal rate and regular rhythm.  Pulmonary:     Effort: No respiratory distress.     Breath sounds: Normal breath sounds. No wheezing or rales.  Abdominal:     General: A surgical scar is present. There is no distension.     Palpations: Abdomen is soft.     Tenderness: There is abdominal tenderness in the epigastric area, periumbilical area, left upper quadrant and left lower quadrant.  Musculoskeletal:     Cervical back: Neck supple.     Comments: No midline or paraspinal muscle tenderness to palpation.  Skin:    General: Skin is warm and dry.  Neurological:     Mental Status: He is alert.     Comments: Clear speech.  Strength and sensation grossly intact bilateral lower extremities.  Psychiatric:        Behavior: Behavior normal.    ED  Results / Procedures / Treatments   Labs (all labs ordered are listed, but only abnormal results are displayed) Labs Reviewed  COMPREHENSIVE METABOLIC PANEL - Abnormal; Notable for the following components:      Result Value   Potassium 3.0 (*)    Glucose, Bld 112 (*)    BUN 5 (*)    Albumin 2.9 (*)    Total Bilirubin 1.3 (*)    All other components within normal limits  CBC WITH DIFFERENTIAL/PLATELET - Abnormal; Notable for the following components:   WBC 13.3 (*)    RDW 17.6 (*)    Neutro Abs 9.0 (*)    Monocytes Absolute 1.9 (*)    All other components within normal limits  LIPASE, BLOOD - Abnormal; Notable  for the following components:   Lipase 126 (*)    All other components within normal limits  URINALYSIS, ROUTINE W REFLEX MICROSCOPIC - Abnormal; Notable for the following components:   Color, Urine AMBER (*)    APPearance HAZY (*)    Ketones, ur 80 (*)    Protein, ur 30 (*)    All other components within normal limits  RESP PANEL BY RT-PCR (FLU A&B, COVID) [id number]    EKG None  Radiology DG Chest 2 View  Result Date: 08/13/2022 CLINICAL DATA:  56 year old male with shortness of breath. EXAM: CHEST - 2 VIEW COMPARISON:  Chest CT 04/17/2022 and earlier. FINDINGS: AP and lateral views at 0505 hours. The patient's arms are not raised on the lateral, limiting its utility. Upper lobe paraseptal and bullous emphysema better demonstrated on the May CT. Lower lung volumes now. Mediastinal contours remain within normal limits. No superimposed pneumothorax, pulmonary edema, pleural effusion, or consolidation identified. No acute osseous abnormality identified. Right upper quadrant cholecystectomy clips. Visible bowel gas pattern is within normal limits. IMPRESSION: Bullous Emphysema (ICD10-J43.9), as seen by CT in May. Lower lung volumes. No acute cardiopulmonary abnormality. Electronically Signed   By: Genevie Ann M.D.   On: 08/13/2022 05:51    Procedures Procedures    Medications  Ordered in ED Medications - No data to display  ED Course/ Medical Decision Making/ A&P                           Medical Decision Making Amount and/or Complexity of Data Reviewed Labs: ordered. Radiology: ordered.  Risk Prescription drug management.  Patient presents to the ED with complaints of abdominal pain, this involves an extensive number of treatment options, and is a complaint that carries with it a high risk of complications and morbidity. Nontoxic, vitals w/ mildly elevated BP. Left sided and central upper abdominal TTP.   Ddx including but not limited to: Recurrent pancreatitis, PUD, GERD, perforation, obstruction, diverticulitis, colitis, appendicitis, viral GI illness, musculoskeletal pain, pyelonephritis, nephrolithiasis.  Additional history obtained:  Chart/nursing notes reviewed.  External records viewed including prior admits.   EKG: Sinus  Lab Tests:  I viewed & interpreted labs including:  CBC: Leukocytosis CMP: Mild hypokalemia & hypoalbuminemia.  Lipase: Mild elevation in lipase UA: Ketonuria.   Imaging Studies:  I ordered and viewed the following imaging, agree with radiologist impression:  CXR: Bullous Emphysema, as seen by CT in May. Lower lung volumes. No acute cardiopulmonary abnormality  06:00: RE-EVAL: Patient with no change in pain, remains severe. Will give Dilaudid. Pending CT.   06:30: Patient care signed out to [place] @ shift change pending CT & disposition.   Portions of this note were generated with [employer]. Dictation errors may occur despite best attempts at proofreading.  Final Clinical Impression(s) / ED Diagnoses Final diagnoses:  None    Rx / DC Orders ED Discharge Orders     None         Amaryllis Dyke, PA-C 08/13/22 [id number]    Quintella Reichert, MD 08/14/22 0008

## 2022-08-13 NOTE — Discharge Instructions (Addendum)
As discussed, you have a fluid collection around your spleen which could be blood from your fall but also could be an infection.  Please take 1 g of Tylenol every 6 hours as needed, 800 mg of ibuprofen every 8 hours, and your home oxycodone for pain.  Please take the antibiotic as directed until finished.  It is very importantly follow-up with your primary care doctor.  Please make sure to return to the ER if you have any worsening fevers, chills, uncontrolled pain, dizziness, or any other new or concerning symptoms.

## 2022-08-13 NOTE — ED Triage Notes (Signed)
Pt arrived POV with c/c of lower back pain and abdominal pain. Complaints of pain in upper and lower abdominal area. Denies chest pain.

## 2022-08-14 ENCOUNTER — Encounter (HOSPITAL_COMMUNITY): Payer: Self-pay | Admitting: Emergency Medicine

## 2022-08-14 ENCOUNTER — Emergency Department (HOSPITAL_COMMUNITY): Payer: Medicare Other

## 2022-08-14 ENCOUNTER — Other Ambulatory Visit: Payer: Self-pay

## 2022-08-14 ENCOUNTER — Inpatient Hospital Stay (HOSPITAL_COMMUNITY)
Admission: EM | Admit: 2022-08-14 | Discharge: 2022-08-17 | DRG: 815 | Disposition: A | Discharge status: Home / Self Care | Payer: Medicare Other | Attending: Student in an Organized Health Care Education/Training Program | Admitting: Student in an Organized Health Care Education/Training Program

## 2022-08-14 DIAGNOSIS — W1830XA Fall on same level, unspecified, initial encounter: Secondary | ICD-10-CM | POA: Diagnosis present

## 2022-08-14 DIAGNOSIS — G894 Chronic pain syndrome: Secondary | ICD-10-CM | POA: Diagnosis present

## 2022-08-14 DIAGNOSIS — Z820 Family history of epilepsy and other diseases of the nervous system: Secondary | ICD-10-CM

## 2022-08-14 DIAGNOSIS — J449 Chronic obstructive pulmonary disease, unspecified: Secondary | ICD-10-CM | POA: Diagnosis present

## 2022-08-14 DIAGNOSIS — D733 Abscess of spleen: Principal | ICD-10-CM | POA: Diagnosis present

## 2022-08-14 DIAGNOSIS — M48061 Spinal stenosis, lumbar region without neurogenic claudication: Secondary | ICD-10-CM | POA: Diagnosis present

## 2022-08-14 DIAGNOSIS — K5900 Constipation, unspecified: Secondary | ICD-10-CM | POA: Diagnosis present

## 2022-08-14 DIAGNOSIS — Y92008 Other place in unspecified non-institutional (private) residence as the place of occurrence of the external cause: Secondary | ICD-10-CM

## 2022-08-14 DIAGNOSIS — G629 Polyneuropathy, unspecified: Secondary | ICD-10-CM | POA: Diagnosis present

## 2022-08-14 DIAGNOSIS — Z7951 Long term (current) use of inhaled steroids: Secondary | ICD-10-CM

## 2022-08-14 DIAGNOSIS — Z23 Encounter for immunization: Secondary | ICD-10-CM

## 2022-08-14 DIAGNOSIS — F1021 Alcohol dependence, in remission: Secondary | ICD-10-CM | POA: Diagnosis present

## 2022-08-14 DIAGNOSIS — K861 Other chronic pancreatitis: Secondary | ICD-10-CM | POA: Diagnosis present

## 2022-08-14 DIAGNOSIS — Z8051 Family history of malignant neoplasm of kidney: Secondary | ICD-10-CM

## 2022-08-14 DIAGNOSIS — Z800 Family history of malignant neoplasm of digestive organs: Secondary | ICD-10-CM

## 2022-08-14 DIAGNOSIS — B182 Chronic viral hepatitis C: Secondary | ICD-10-CM | POA: Diagnosis present

## 2022-08-14 DIAGNOSIS — F17210 Nicotine dependence, cigarettes, uncomplicated: Secondary | ICD-10-CM | POA: Diagnosis present

## 2022-08-14 DIAGNOSIS — S36029A Unspecified contusion of spleen, initial encounter: Secondary | ICD-10-CM | POA: Diagnosis present

## 2022-08-14 DIAGNOSIS — K219 Gastro-esophageal reflux disease without esophagitis: Secondary | ICD-10-CM | POA: Diagnosis present

## 2022-08-14 DIAGNOSIS — Z560 Unemployment, unspecified: Secondary | ICD-10-CM

## 2022-08-14 DIAGNOSIS — Z86718 Personal history of other venous thrombosis and embolism: Secondary | ICD-10-CM

## 2022-08-14 DIAGNOSIS — Z8249 Family history of ischemic heart disease and other diseases of the circulatory system: Secondary | ICD-10-CM

## 2022-08-14 DIAGNOSIS — Z79899 Other long term (current) drug therapy: Secondary | ICD-10-CM

## 2022-08-14 DIAGNOSIS — Z803 Family history of malignant neoplasm of breast: Secondary | ICD-10-CM

## 2022-08-14 DIAGNOSIS — R1012 Left upper quadrant pain: Principal | ICD-10-CM

## 2022-08-14 DIAGNOSIS — Z20822 Contact with and (suspected) exposure to covid-19: Secondary | ICD-10-CM | POA: Diagnosis present

## 2022-08-14 DIAGNOSIS — N400 Enlarged prostate without lower urinary tract symptoms: Secondary | ICD-10-CM | POA: Diagnosis present

## 2022-08-14 LAB — URINALYSIS, ROUTINE W REFLEX MICROSCOPIC
Bacteria, UA: NONE SEEN
Bilirubin Urine: NEGATIVE
Glucose, UA: NEGATIVE mg/dL
Hgb urine dipstick: NEGATIVE
Ketones, ur: 5 mg/dL — AB
Leukocytes,Ua: NEGATIVE
Nitrite: NEGATIVE
Protein, ur: 30 mg/dL — AB
Specific Gravity, Urine: 1.018 (ref 1.005–1.030)
pH: 5 (ref 5.0–8.0)

## 2022-08-14 LAB — CBC WITH DIFFERENTIAL/PLATELET
Abs Immature Granulocytes: 0.12 10*3/uL — ABNORMAL HIGH (ref 0.00–0.07)
Basophils Absolute: 0.1 10*3/uL (ref 0.0–0.1)
Basophils Relative: 1 %
Eosinophils Absolute: 0.3 10*3/uL (ref 0.0–0.5)
Eosinophils Relative: 2 %
HCT: 43.6 % (ref 39.0–52.0)
Hemoglobin: 14.9 g/dL (ref 13.0–17.0)
Immature Granulocytes: 1 %
Lymphocytes Relative: 16 %
Lymphs Abs: 2.5 10*3/uL (ref 0.7–4.0)
MCH: 31.1 pg (ref 26.0–34.0)
MCHC: 34.2 g/dL (ref 30.0–36.0)
MCV: 91 fL (ref 80.0–100.0)
Monocytes Absolute: 1.7 10*3/uL — ABNORMAL HIGH (ref 0.1–1.0)
Monocytes Relative: 11 %
Neutro Abs: 11.3 10*3/uL — ABNORMAL HIGH (ref 1.7–7.7)
Neutrophils Relative %: 69 %
Platelets: 436 10*3/uL — ABNORMAL HIGH (ref 150–400)
RBC: 4.79 MIL/uL (ref 4.22–5.81)
RDW: 17.5 % — ABNORMAL HIGH (ref 11.5–15.5)
WBC: 16 10*3/uL — ABNORMAL HIGH (ref 4.0–10.5)
nRBC: 0 % (ref 0.0–0.2)

## 2022-08-14 LAB — COMPREHENSIVE METABOLIC PANEL
ALT: 17 U/L (ref 0–44)
AST: 21 U/L (ref 15–41)
Albumin: 2.9 g/dL — ABNORMAL LOW (ref 3.5–5.0)
Alkaline Phosphatase: 86 U/L (ref 38–126)
Anion gap: 12 (ref 5–15)
BUN: 5 mg/dL — ABNORMAL LOW (ref 6–20)
CO2: 24 mmol/L (ref 22–32)
Calcium: 9.2 mg/dL (ref 8.9–10.3)
Chloride: 102 mmol/L (ref 98–111)
Creatinine, Ser: 1.05 mg/dL (ref 0.61–1.24)
GFR, Estimated: >60 mL/min (ref >60)
Glucose, Bld: 98 mg/dL (ref 70–99)
Potassium: 3 mmol/L — ABNORMAL LOW (ref 3.5–5.1)
Sodium: 138 mmol/L (ref 135–145)
Total Bilirubin: 0.8 mg/dL (ref 0.3–1.2)
Total Protein: 8 g/dL (ref 6.5–8.1)

## 2022-08-14 LAB — LACTIC ACID, PLASMA: Lactic Acid, Venous: 1.1 mmol/L (ref 0.5–1.9)

## 2022-08-14 LAB — LIPASE, BLOOD: Lipase: 174 U/L — ABNORMAL HIGH (ref 11–51)

## 2022-08-14 MED ORDER — ENOXAPARIN SODIUM 40 MG/0.4ML IJ SOSY
40.0000 mg | PREFILLED_SYRINGE | INTRAMUSCULAR | Status: DC
  Administered 2022-08-14 – 2022-08-16 (×3): 40 mg via SUBCUTANEOUS
  Filled 2022-08-14 (×3): qty 0.4

## 2022-08-14 MED ORDER — ALBUTEROL SULFATE HFA 108 (90 BASE) MCG/ACT IN AERS: 2.0000 | INHALATION_SPRAY | Freq: Four times a day (QID) | RESPIRATORY_TRACT | Status: DC | PRN

## 2022-08-14 MED ORDER — ONDANSETRON HCL 4 MG/2ML IJ SOLN
4.0000 mg | Freq: Four times a day (QID) | INTRAMUSCULAR | Status: DC | PRN
  Administered 2022-08-15 – 2022-08-17 (×2): 4 mg via INTRAVENOUS
  Filled 2022-08-14 (×2): qty 2

## 2022-08-14 MED ORDER — OXYCODONE HCL 5 MG PO TABS
7.5000 mg | ORAL_TABLET | Freq: Four times a day (QID) | ORAL | Status: DC
  Administered 2022-08-14 – 2022-08-16 (×6): 7.5 mg via ORAL
  Filled 2022-08-14 (×8): qty 2

## 2022-08-14 MED ORDER — POTASSIUM CHLORIDE 20 MEQ PO PACK
40.0000 meq | PACK | Freq: Two times a day (BID) | ORAL | Status: DC
  Administered 2022-08-14: 40 meq via ORAL
  Filled 2022-08-14: qty 2

## 2022-08-14 MED ORDER — FLUTICASONE FUROATE-VILANTEROL 200-25 MCG/ACT IN AEPB
1.0000 | INHALATION_SPRAY | Freq: Every day | RESPIRATORY_TRACT | Status: DC
  Administered 2022-08-14 – 2022-08-17 (×4): 1 via RESPIRATORY_TRACT
  Filled 2022-08-14: qty 28

## 2022-08-14 MED ORDER — TAMSULOSIN HCL 0.4 MG PO CAPS
0.4000 mg | ORAL_CAPSULE | Freq: Every day | ORAL | Status: DC
  Administered 2022-08-14 – 2022-08-17 (×4): 0.4 mg via ORAL
  Filled 2022-08-14 (×4): qty 1

## 2022-08-14 MED ORDER — ALBUTEROL SULFATE (2.5 MG/3ML) 0.083% IN NEBU: 2.5000 mg | INHALATION_SOLUTION | Freq: Four times a day (QID) | RESPIRATORY_TRACT | Status: DC | PRN

## 2022-08-14 MED ORDER — DULOXETINE HCL 60 MG PO CPEP
60.0000 mg | ORAL_CAPSULE | Freq: Every day | ORAL | Status: DC
  Administered 2022-08-14 – 2022-08-17 (×4): 60 mg via ORAL
  Filled 2022-08-14 (×2): qty 2
  Filled 2022-08-14 (×2): qty 1

## 2022-08-14 MED ORDER — ONDANSETRON HCL 4 MG PO TABS
4.0000 mg | ORAL_TABLET | Freq: Four times a day (QID) | ORAL | Status: DC | PRN
  Administered 2022-08-16: 4 mg via ORAL
  Filled 2022-08-14: qty 1

## 2022-08-14 MED ORDER — FOLIC ACID 1 MG PO TABS
1.0000 mg | ORAL_TABLET | Freq: Every day | ORAL | Status: DC
  Administered 2022-08-14 – 2022-08-17 (×4): 1 mg via ORAL
  Filled 2022-08-14 (×4): qty 1

## 2022-08-14 MED ORDER — PANCRELIPASE (LIP-PROT-AMYL) 36000-114000 UNITS PO CPEP
72000.0000 [IU] | ORAL_CAPSULE | Freq: Three times a day (TID) | ORAL | Status: DC
  Administered 2022-08-15 – 2022-08-17 (×8): 72000 [IU] via ORAL
  Filled 2022-08-14 (×8): qty 2

## 2022-08-14 MED ORDER — HYDROMORPHONE HCL 1 MG/ML IJ SOLN
1.0000 mg | Freq: Once | INTRAMUSCULAR | Status: AC
  Administered 2022-08-14: 1 mg via INTRAVENOUS
  Filled 2022-08-14: qty 1

## 2022-08-14 MED ORDER — HYDROMORPHONE HCL 1 MG/ML IJ SOLN
0.5000 mg | INTRAMUSCULAR | Status: DC | PRN
  Administered 2022-08-15: 0.5 mg via INTRAVENOUS
  Filled 2022-08-14: qty 1

## 2022-08-14 MED ORDER — ADULT MULTIVITAMIN W/MINERALS CH
1.0000 | ORAL_TABLET | Freq: Every day | ORAL | Status: DC
  Administered 2022-08-15 – 2022-08-17 (×3): 1 via ORAL
  Filled 2022-08-14 (×3): qty 1

## 2022-08-14 MED ORDER — SENNOSIDES-DOCUSATE SODIUM 8.6-50 MG PO TABS: 1.0000 | ORAL_TABLET | Freq: Every evening | ORAL | Status: DC | PRN

## 2022-08-14 MED ORDER — PANCRELIPASE (LIP-PROT-AMYL) 36000-114000 UNITS PO CPEP: 36000.0000 [IU] | ORAL_CAPSULE | Freq: Every day | ORAL | Status: DC | PRN

## 2022-08-14 MED ORDER — PIPERACILLIN-TAZOBACTAM 3.375 G IVPB
3.3750 g | Freq: Two times a day (BID) | INTRAVENOUS | Status: DC
  Administered 2022-08-14 – 2022-08-16 (×5): 3.375 g via INTRAVENOUS
  Filled 2022-08-14 (×5): qty 50

## 2022-08-14 MED ORDER — ACETAMINOPHEN 650 MG RE SUPP: 650.0000 mg | Freq: Four times a day (QID) | RECTAL | Status: DC | PRN

## 2022-08-14 MED ORDER — OXYCODONE HCL 5 MG PO TABS: 5.0000 mg | ORAL_TABLET | Freq: Four times a day (QID) | ORAL | Status: DC

## 2022-08-14 MED ORDER — VITAMIN B-12 1000 MCG PO TABS
1000.0000 ug | ORAL_TABLET | Freq: Every day | ORAL | Status: DC
  Administered 2022-08-14 – 2022-08-17 (×4): 1000 ug via ORAL
  Filled 2022-08-14 (×4): qty 1

## 2022-08-14 MED ORDER — ACETAMINOPHEN 325 MG PO TABS: 650.0000 mg | ORAL_TABLET | Freq: Four times a day (QID) | ORAL | Status: DC | PRN

## 2022-08-14 MED ORDER — PREGABALIN 75 MG PO CAPS
150.0000 mg | ORAL_CAPSULE | Freq: Three times a day (TID) | ORAL | Status: DC
  Administered 2022-08-14 – 2022-08-17 (×8): 150 mg via ORAL
  Filled 2022-08-14 (×8): qty 2

## 2022-08-14 MED ORDER — IOHEXOL 350 MG/ML SOLN
75.0000 mL | Freq: Once | INTRAVENOUS | Status: AC | PRN
  Administered 2022-08-14: 75 mL via INTRAVENOUS

## 2022-08-14 NOTE — ED Notes (Addendum)
Pharmacist notified that upon walking out the nourishment station this RN accidentally dropped medication on the floor which rolled under the fridge and cannot be obtained. Pharmacist reports awareness. Nurse in [place] acknowledges accident as she was witness to original waste

## 2022-08-14 NOTE — ED Notes (Signed)
Pain started at 10/10   now is 8/10
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
## 2022-08-14 NOTE — ED Triage Notes (Signed)
Pt reports currently 10/10 abdominal pain. Seen here yesterday for same and given option to stay but went home because he thought he could control the pain. Pt reports took 1 oxycodone at 6am. Pt with hx of pancretitis and recent splenic trauma with possible abscess.

## 2022-08-14 NOTE — ED Provider Notes (Signed)
[place] EMERGENCY DEPARTMENT Provider Note   CSN: [id number] Arrival date & time: 08/14/22  1318     History  Chief Complaint  Patient presents with   Abdominal Pain    Wesley [name] is a 56 y.o. male.  HPI 56 year old male with a history of pancreatitis, alcohol abuse, chronic hepatitis C, GERD, COPD and prior abdominal surgeries including colostomy status post takedown and cholecystectomy who presents to the ER with complaints of left upper quadrant pain.  He was seen here yesterday for the same, I personally evaluated the patient.  His CT scan showed a possible splenic hematoma versus abscess.  He did have a fall and fell into a dresser his left upper quadrant.  He was also endorsing chills over the last several days.  Discussed admission with internal medicine resident team, who saw and evaluated the patient and felt that he was stable for discharge with antibiotics.  Sent home with Augmentin.  Returns with complaints of uncontrolled pain.  He states that he was compliant with his Tylenol and ibuprofen and morphine    Home Medications Prior to Admission medications   Medication Sig Start Date End Date Taking? Authorizing Provider  albuterol (VENTOLIN HFA) 108 (90 Base) MCG/ACT inhaler Inhale 2 puffs into the lungs every 6 (six) hours as needed for wheezing or shortness of breath. 11/05/21   [name], MD  amitriptyline ([name]) 50 MG tablet Take 1 tablet (50 mg total) by mouth at bedtime. 03/20/22 06/18/22  [name], MD  amoxicillin-clavulanate (AUGMENTIN) 875-125 MG tablet Take 1 tablet by mouth every 12 (twelve) hours for 7 days. 08/13/22 08/21/22  Garald Balding, PA-C  baclofen (LIORESAL) 10 MG tablet Take 0.5-1 tablets (5-10 mg total) by mouth every 8 (eight) hours as needed. 03/20/22   [name], MD  DULoxetine (CYMBALTA) 60 MG capsule TAKE 1 CAPSULE BY MOUTH DAILY 05/22/22 05/22/23  [name], MD  fluconazole (DIFLUCAN) 100 MG  tablet Take two once then one daily until gone 06/11/22   [name], MD  fluticasone furoate-vilanterol (BREO ELLIPTA) 200-25 MCG/ACT AEPB Inhale 1 puff into the lungs daily. 06/11/22   [name], MD  folic acid (FOLVITE) 1 MG tablet Take 1 tablet (1 mg total) by mouth daily. 03/20/22 03/20/23  [name], MD  lipase/protease/amylase ([name]) 36000 UNITS CPEP capsule Take 2 capsules (72,000 Units total) by mouth 3 (three) times daily with meals AND 1 capsule (36,000 Units total) with snacks. Max: 9 capsules per [place]. 05/28/22   [name], MD  Multiple Vitamin (MULTIVITAMIN WITH MINERALS) TABS tablet Spectravite Ultra Men 50 Plus 300 mcg-60 mcg-600 mcg-300 mcg tablet  TAKE 1 TABLET BY MOUTH EVERY [place]    [provider]  Multiple Vitamins-Minerals (COMPLETE) TABS daily.    [provider]  nicotine (NICODERM CQ - DOSED IN MG/24 HOURS) 21 mg/24hr patch Place 1 patch (21 mg total) onto the skin daily. 04/07/22   [name], MD  nicotine polacrilex (COMMIT) 4 MG lozenge TAKE THREE A [place] TO STOP SMOKING 09/17/21 09/17/22  [name], MD  ondansetron (ZOFRAN) 4 MG tablet Take 1 tablet (4 mg total) by mouth every 8 (eight) hours as needed for nausea or vomiting. 08/13/22 08/13/23  [name], MD  pantoprazole (PROTONIX) 40 MG tablet Take 1 tablet (40 mg total) by mouth 2 (two) times daily before a meal. 03/20/22 03/20/23  [name], MD  Polyethylene Glycol 3350 (  MIRALAX PO) Take by mouth. PRN    [provider]  pregabalin (LYRICA) 150 MG capsule Take 1 capsule (150 mg total) by mouth in the morning, at noon, and at bedtime. 03/20/22 09/18/22  [name], MD  tamsulosin (FLOMAX) 0.4 MG CAPS capsule Take 1 capsule (0.4 mg total) by mouth daily. 04/24/22   [name], MD  thiamine 100 MG tablet TAKE 1 TABLET (100 MG TOTAL) BY MOUTH DAILY. 03/20/22 03/20/23  [name], MD  thiamine 100 MG tablet Take 1 tablet by mouth  daily.    [provider]  vitamin B-12 (CYANOCOBALAMIN) 1000 MCG tablet Take 1 tablet (1,000 mcg total) by mouth daily. 03/20/22   [name], MD  Vitamin D, Ergocalciferol, (DRISDOL) 1.25 MG (50000 UNIT) CAPS capsule Take 1 capsule (50,000 Units total) by mouth every 7 (seven) days. 03/20/22   [name], MD      Allergies    Patient has no known allergies.    Review of Systems   Review of Systems Ten systems reviewed and are negative for acute change, except as noted in the HPI.   Physical Exam Updated Vital Signs BP (!) 119/93 (BP Location: Right Arm)   Pulse (!) 109   Temp 98.5 F (36.9 C)   Resp 20   Ht 5' 8" (1.727 m)   Wt 79 kg   SpO2 94%   BMI 26.48 kg/m  Physical Exam Vitals and nursing note reviewed.  Constitutional:      General: He is not in acute distress.    Appearance: He is well-developed.     Comments: Appears in pain   HENT:     Head: Normocephalic and atraumatic.  Eyes:     Conjunctiva/sclera: Conjunctivae normal.  Cardiovascular:     Rate and Rhythm: Normal rate and regular rhythm.     Heart sounds: No murmur heard. Pulmonary:     Effort: Pulmonary effort is normal. No respiratory distress.     Breath sounds: Normal breath sounds.  Abdominal:     Palpations: Abdomen is soft.     Tenderness: There is no abdominal tenderness.     Comments: LUQ tenderness, mild guarding   Musculoskeletal:        General: No swelling.     Cervical back: Neck supple.  Skin:    General: Skin is warm and dry.     Capillary Refill: Capillary refill takes less than 2 seconds.  Neurological:     Mental Status: He is alert.  Psychiatric:        Mood and Affect: Mood normal.     ED Results / Procedures / Treatments   Labs (all labs ordered are listed, but only abnormal results are displayed) Labs Reviewed  CBC WITH DIFFERENTIAL/PLATELET - Abnormal; Notable for the following components:      Result Value   WBC 16.0 (*)    RDW 17.5 (*)     Platelets 436 (*)    Neutro Abs 11.3 (*)    Monocytes Absolute 1.7 (*)    Abs Immature Granulocytes 0.12 (*)    All other components within normal limits  COMPREHENSIVE METABOLIC PANEL - Abnormal; Notable for the following components:   Potassium 3.0 (*)    BUN 5 (*)    Albumin 2.9 (*)    All other components within normal limits  LIPASE, BLOOD - Abnormal; Notable for the following components:   Lipase 174 (*)    All other components within  normal limits  URINALYSIS, ROUTINE W REFLEX MICROSCOPIC - Abnormal; Notable for the following components:   Color, Urine AMBER (*)    APPearance HAZY (*)    Ketones, ur 5 (*)    Protein, ur 30 (*)    All other components within normal limits  LACTIC ACID, PLASMA  LACTIC ACID, PLASMA    EKG None  Radiology CT Abdomen Pelvis W Contrast  Result Date: 08/13/2022 CLINICAL DATA:  Acute generalized abdominal pain. EXAM: CT ABDOMEN AND PELVIS WITH CONTRAST TECHNIQUE: Multidetector CT imaging of the abdomen and pelvis was performed using the standard protocol following bolus administration of intravenous contrast. RADIATION DOSE REDUCTION: This exam was performed according to the departmental dose-optimization program which includes automated exposure control, adjustment of the mA and/or kV according to patient size and/or use of iterative reconstruction technique. CONTRAST:  67m OMNIPAQUE IOHEXOL 350 MG/ML SOLN COMPARISON:  November 15, 2021. FINDINGS: Lower chest: Mild bilateral posterior basilar subsegmental atelectasis is noted. Hepatobiliary: Status post cholecystectomy. Minimal intrahepatic and extrahepatic biliary dilatation is noted most likely due to post cholecystectomy status. Stable hepatic cysts are noted. Pancreas: Unremarkable. No pancreatic ductal dilatation or surrounding inflammatory changes. Spleen: Multiple subcapsular fluid collections are noted, the largest measuring 6.9 x 2.7 cm, concerning for hematoma or possibly abscess. Low density  is seen involving the inferior portion of the spleen concerning for possible laceration or infarction. Adrenals/Urinary Tract: Adrenal glands are unremarkable. Kidneys are normal, without renal calculi, focal lesion, or hydronephrosis. Bladder is unremarkable. Stomach/Bowel: Stomach is within normal limits. Appendix appears normal. No evidence of bowel wall thickening, distention, or inflammatory changes. Vascular/Lymphatic: Aortic atherosclerosis. No enlarged abdominal or pelvic lymph nodes. Reproductive: Prostate is unremarkable. Other: Small fat containing right inguinal hernia is noted. No ascites is noted. Musculoskeletal: Severe degenerative disc disease is noted at L5-S1 with sclerosis of the adjacent vertebral bodies. No acute osseous abnormality is noted. IMPRESSION: Multiple subcapsular splenic fluid collections are noted, the largest measuring 6.9 x 2.7 cm. It is uncertain if these represent old hematomas or possibly abscesses. There is noted abnormal low density involving the inferior portion of the spleen which may represent laceration or possibly infarction. Mild bilateral posterior basilar subsegmental atelectasis is noted. Small fat containing right inguinal hernia. Aortic Atherosclerosis (ICD10-I70.0). Electronically Signed   By: JMarijo ConceptionM.D.   On: 08/13/2022 09:06   DG Chest 2 View  Result Date: 08/13/2022 CLINICAL DATA:  56year old male with shortness of breath. EXAM: CHEST - 2 VIEW COMPARISON:  Chest CT 04/17/2022 and earlier. FINDINGS: AP and lateral views at 0505 hours. The patient's arms are not raised on the lateral, limiting its utility. Upper lobe paraseptal and bullous emphysema better demonstrated on the May CT. Lower lung volumes now. Mediastinal contours remain within normal limits. No superimposed pneumothorax, pulmonary edema, pleural effusion, or consolidation identified. No acute osseous abnormality identified. Right upper quadrant cholecystectomy clips. Visible bowel  gas pattern is within normal limits. IMPRESSION: Bullous Emphysema (ICD10-J43.9), as seen by CT in May. Lower lung volumes. No acute cardiopulmonary abnormality. Electronically Signed   By: HGenevie AnnM.D.   On: 08/13/2022 05:51    Procedures Procedures    Medications Ordered in ED Medications  HYDROmorphone (DILAUDID) injection 1 mg (has no administration in time range)  piperacillin-tazobactam (ZOSYN) IVPB 3.375 g (has no administration in time range)    ED Course/ Medical Decision Making/ A&P  Medical Decision Making Amount and/or Complexity of Data Reviewed Radiology: ordered.  Risk Prescription drug management.   56 year old male presents to the ER with complaints of left upper quadrant pain.  Seen here by myself yesterday with a splenic hematoma versus abscess.  He is now back with uncontrollable pain despite taking his home regimen regimen.  Had not had a chance to start his antibiotics.  Lab work ordered, reviewed and interpreted by me, CBC with a slight increase in his white count from 13-16 today.  His potassium is 3, no other electrode abnormalities.  Normal LFTs.  His lipase is slightly elevated  compared to yesterday or elevated than yesterday, 126-176.  Lactid is normal.  Plan to repeat a CT scan of the abdomen to rule out splenic rupture, worsening hematoma, etc.  Was given Dilaudid, I did start Zosyn for possible abscess.  He will need admission for further pain control. Care signed out to oncoming team who will follow up on  CT Scan and reconsult family medicine for admission.  Final Clinical Impression(s) / ED Diagnoses Final diagnoses:  None    Rx / DC Orders ED Discharge Orders     None         Garald Balding, PA-C 08/14/22 1514    Lennice Sites, DO 08/15/22 [phone]

## 2022-08-14 NOTE — Hospital Course (Addendum)
States he took oxycodone last night and woke up in worsening pain this morning. States his current pain is different from his pancreatitis pain. The current pain is on his LUQ and radiates to his back. Worsened with activity and relieved with pain meds and rest. States he had a subjective fever this morning but no chills, CP, diarrhea, dysuria, urgency, urinary frequency, no sick contacts. Had episode of emesis and diarrhea two days ago.** Endorse chronic SOB with his COPD but no change. He has   Hx of spinal stenosis, neuropathy, COPD, chronic pancreatitis  Social Hx: Stopped drinking 4 years ago. Smokes 1 ppd since he was 56 years old. Received treatment for hep C. Lives with wife and grand kids. Retired from [employer] 4 years ago.
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
## 2022-08-14 NOTE — ED Provider Triage Note (Signed)
Emergency Medicine Provider Triage Evaluation Note  Wesley [name] , a 56 y.o. male  was evaluated in triage.  Pt complains of left upper quadrant abdominal pain.  Seen yesterday, diagnosed with splenic hematoma versus abscess.  Initially considered admission but hospitalist evaluated and did not think patient needed admission.  Discharged with pain management and Augmentin.  Patient is not started taking the antibiotic yet, no vomiting.  States the abdominal pain is too severe..  Review of Systems  Per HPI  Physical Exam  BP (!) 119/93 (BP Location: Right Arm)   Pulse (!) 109   Temp 98.5 F (36.9 C)   Resp 20   Ht 5' 8" (1.727 m)   Wt 79 kg   SpO2 94%   BMI 26.48 kg/m  Gen:   Awake, no distress   Resp:  Normal effort  MSK:   Moves extremities without difficulty  Other:  Upper quadrant abdominal pain with guarding  Medical Decision Making  Medically screening exam initiated at 1:27 PM.  Appropriate orders placed.  Om N Clagg was informed that the remainder of the evaluation will be completed by another provider, this initial triage assessment does not replace that evaluation, and the importance of remaining in the ED until their evaluation is complete.  Imaging was done yesterday, will leave decision for repeat CT up to provider who sees the patient in the back.     Sherrill Raring, PA-C 08/14/22 1328

## 2022-08-15 DIAGNOSIS — K219 Gastro-esophageal reflux disease without esophagitis: Secondary | ICD-10-CM | POA: Diagnosis present

## 2022-08-15 DIAGNOSIS — Z560 Unemployment, unspecified: Secondary | ICD-10-CM | POA: Diagnosis not present

## 2022-08-15 DIAGNOSIS — S36029A Unspecified contusion of spleen, initial encounter: Secondary | ICD-10-CM | POA: Diagnosis present

## 2022-08-15 DIAGNOSIS — G629 Polyneuropathy, unspecified: Secondary | ICD-10-CM | POA: Diagnosis present

## 2022-08-15 DIAGNOSIS — Z8249 Family history of ischemic heart disease and other diseases of the circulatory system: Secondary | ICD-10-CM | POA: Diagnosis not present

## 2022-08-15 DIAGNOSIS — D735 Infarction of spleen: Secondary | ICD-10-CM | POA: Diagnosis not present

## 2022-08-15 DIAGNOSIS — Z800 Family history of malignant neoplasm of digestive organs: Secondary | ICD-10-CM | POA: Diagnosis not present

## 2022-08-15 DIAGNOSIS — J449 Chronic obstructive pulmonary disease, unspecified: Secondary | ICD-10-CM | POA: Diagnosis present

## 2022-08-15 DIAGNOSIS — Y92008 Other place in unspecified non-institutional (private) residence as the place of occurrence of the external cause: Secondary | ICD-10-CM | POA: Diagnosis not present

## 2022-08-15 DIAGNOSIS — G894 Chronic pain syndrome: Secondary | ICD-10-CM | POA: Diagnosis present

## 2022-08-15 DIAGNOSIS — B182 Chronic viral hepatitis C: Secondary | ICD-10-CM | POA: Diagnosis present

## 2022-08-15 DIAGNOSIS — Z79899 Other long term (current) drug therapy: Secondary | ICD-10-CM | POA: Diagnosis not present

## 2022-08-15 DIAGNOSIS — F1021 Alcohol dependence, in remission: Secondary | ICD-10-CM | POA: Diagnosis present

## 2022-08-15 DIAGNOSIS — Z86718 Personal history of other venous thrombosis and embolism: Secondary | ICD-10-CM | POA: Diagnosis not present

## 2022-08-15 DIAGNOSIS — K861 Other chronic pancreatitis: Secondary | ICD-10-CM | POA: Diagnosis present

## 2022-08-15 DIAGNOSIS — F17210 Nicotine dependence, cigarettes, uncomplicated: Secondary | ICD-10-CM | POA: Diagnosis present

## 2022-08-15 DIAGNOSIS — K5900 Constipation, unspecified: Secondary | ICD-10-CM | POA: Diagnosis present

## 2022-08-15 DIAGNOSIS — W1830XA Fall on same level, unspecified, initial encounter: Secondary | ICD-10-CM | POA: Diagnosis present

## 2022-08-15 DIAGNOSIS — N400 Enlarged prostate without lower urinary tract symptoms: Secondary | ICD-10-CM | POA: Diagnosis present

## 2022-08-15 DIAGNOSIS — Z20822 Contact with and (suspected) exposure to covid-19: Secondary | ICD-10-CM | POA: Diagnosis present

## 2022-08-15 DIAGNOSIS — Z820 Family history of epilepsy and other diseases of the nervous system: Secondary | ICD-10-CM | POA: Diagnosis not present

## 2022-08-15 DIAGNOSIS — D733 Abscess of spleen: Secondary | ICD-10-CM | POA: Diagnosis present

## 2022-08-15 DIAGNOSIS — M48061 Spinal stenosis, lumbar region without neurogenic claudication: Secondary | ICD-10-CM | POA: Diagnosis present

## 2022-08-15 DIAGNOSIS — Z7951 Long term (current) use of inhaled steroids: Secondary | ICD-10-CM | POA: Diagnosis not present

## 2022-08-15 DIAGNOSIS — Z23 Encounter for immunization: Secondary | ICD-10-CM | POA: Diagnosis present

## 2022-08-15 DIAGNOSIS — Z803 Family history of malignant neoplasm of breast: Secondary | ICD-10-CM | POA: Diagnosis not present

## 2022-08-15 DIAGNOSIS — Z8051 Family history of malignant neoplasm of kidney: Secondary | ICD-10-CM | POA: Diagnosis not present

## 2022-08-15 LAB — BASIC METABOLIC PANEL
Anion gap: 16 — ABNORMAL HIGH (ref 5–15)
BUN: 5 mg/dL — ABNORMAL LOW (ref 6–20)
CO2: 23 mmol/L (ref 22–32)
Calcium: 9.2 mg/dL (ref 8.9–10.3)
Chloride: 100 mmol/L (ref 98–111)
Creatinine, Ser: 1.11 mg/dL (ref 0.61–1.24)
GFR, Estimated: >60 mL/min (ref >60)
Glucose, Bld: 105 mg/dL — ABNORMAL HIGH (ref 70–99)
Potassium: 2.9 mmol/L — ABNORMAL LOW (ref 3.5–5.1)
Sodium: 139 mmol/L (ref 135–145)

## 2022-08-15 LAB — CBC
HCT: 43.2 % (ref 39.0–52.0)
Hemoglobin: 14.9 g/dL (ref 13.0–17.0)
MCH: 31.4 pg (ref 26.0–34.0)
MCHC: 34.5 g/dL (ref 30.0–36.0)
MCV: 90.9 fL (ref 80.0–100.0)
Platelets: 376 10*3/uL (ref 150–400)
RBC: 4.75 MIL/uL (ref 4.22–5.81)
RDW: 17.6 % — ABNORMAL HIGH (ref 11.5–15.5)
WBC: 11.9 10*3/uL — ABNORMAL HIGH (ref 4.0–10.5)
nRBC: 0 % (ref 0.0–0.2)

## 2022-08-15 LAB — HIV ANTIBODY (ROUTINE TESTING W REFLEX): HIV Screen 4th Generation wRfx: NONREACTIVE

## 2022-08-15 LAB — MAGNESIUM: Magnesium: 2 mg/dL (ref 1.7–2.4)

## 2022-08-15 MED ORDER — POTASSIUM CHLORIDE 10 MEQ/100ML IV SOLN: 10.0000 meq | INTRAVENOUS | Status: DC

## 2022-08-15 MED ORDER — ACETAMINOPHEN 500 MG PO TABS
1000.0000 mg | ORAL_TABLET | Freq: Three times a day (TID) | ORAL | Status: DC
  Administered 2022-08-15 – 2022-08-17 (×7): 1000 mg via ORAL
  Filled 2022-08-15 (×7): qty 2

## 2022-08-15 MED ORDER — OXYCODONE HCL 5 MG PO TABS: 5.0000 mg | ORAL_TABLET | Freq: Four times a day (QID) | ORAL | Status: DC | PRN

## 2022-08-15 MED ORDER — LIDOCAINE 5 % EX PTCH
1.0000 | MEDICATED_PATCH | CUTANEOUS | Status: DC
  Administered 2022-08-15 – 2022-08-17 (×3): 1 via TRANSDERMAL
  Filled 2022-08-15 (×3): qty 1

## 2022-08-15 MED ORDER — POTASSIUM CHLORIDE 20 MEQ PO PACK
40.0000 meq | PACK | Freq: Two times a day (BID) | ORAL | Status: DC
  Administered 2022-08-15 – 2022-08-16 (×3): 40 meq via ORAL
  Filled 2022-08-15 (×3): qty 2

## 2022-08-15 MED ORDER — INFLUENZA VAC SPLIT QUAD 0.5 ML IM SUSY
0.5000 mL | PREFILLED_SYRINGE | INTRAMUSCULAR | Status: AC
  Administered 2022-08-17: 0.5 mL via INTRAMUSCULAR

## 2022-08-15 MED ORDER — IBUPROFEN 200 MG PO TABS
400.0000 mg | ORAL_TABLET | Freq: Four times a day (QID) | ORAL | Status: DC
  Administered 2022-08-15 – 2022-08-17 (×9): 400 mg via ORAL
  Filled 2022-08-15 (×7): qty 2
  Filled 2022-08-15: qty 1
  Filled 2022-08-15: qty 2

## 2022-08-15 MED ORDER — OXYCODONE HCL 5 MG PO TABS
5.0000 mg | ORAL_TABLET | Freq: Four times a day (QID) | ORAL | Status: DC | PRN
  Administered 2022-08-16 (×2): 5 mg via ORAL
  Filled 2022-08-15 (×2): qty 1

## 2022-08-15 NOTE — ED Notes (Signed)
This nurse went into room to give medications. IV catheter out of arm. Unable to give IV dilaudid and ABT at this time. Will attempt to restart IV

## 2022-08-15 NOTE — Progress Notes (Signed)
Subjective: Patient evaluated at bedside this AM in the ED. Continues to endorse LUQ pain that is persistent and radiating around his side to his back.. He says the quality of pain has changed and now is pulsatile. He also says the level of pain has increased. Denies HA, dizziness, lightheadedness, substernal CP,SOB,N/V,urinary symptoms. He says home pain meds help temporarily not for very long.  Objective:  Vital signs in last 24 hours: Vitals:   08/15/22 1115 08/15/22 1130 08/15/22 1300 08/15/22 1356  BP: (!) 141/94 (!) 140/89 (!) 133/92 130/89  Pulse: 78 99 89 (!) 102  Resp: _0 Temp:   98.3 F (36.8 C)   TempSrc:   Oral Oral  SpO2: 91% (!) 89% 94% 92%  Weight:    75.6 kg  Height:    5' 8" (1.727 m)   Gen: well nourished, well developed, slight distress, laying in bed CV: RRR, no m/r/g, 2+ bilateral radial pulses Pulm: normal WOB, LCTAB Abd: soft, non-distended, PTP about anterior LUQ and flank Extremities: warm, dry, no LE edema Skin: no subcutaneous nodules,no nail hemorrhage  Assessment/Plan:  Principal Problem:   Splenic abscess  Wesley [name] is a 56 y/o male with a pmh of chronic pancreatitis, chronic HCV, COPD, PAD, alcoholic peripheral neuropathy, chronic thrombosis of splenic vein presenting with persistent LUQ pain.   Peripheral splenic subcapsular hematomas C/f infection of hematoma Patient having some improvement of pain with home oxy, prn dilaudid, scheduled high dose tylenol, lidocaine patch. His pain is still present and severe and seems to be reduced just after prn dilauded, but does not seem to remain controlled long term. Will switch PRN to oxy and figure out oral regimen to discharge on tomorrow. No c/f extracapsular splenic bleed at this time given no abdominal swelling, hypotension and no drop in hemoglobin. Will transition to oral augmentin to complete 5 [place] regimen at discharge. Patient got this at time of d/c from ED 9/13. -oxycodone 7.5 mg  QID -Tylenol 1013m TID -lidocaine patch -oxy 535mprn q6 hours -Advil 40022m6 hours -Continue IV zosyn -daily cbc   Chronic pancreatitis Chronic splenic vein thrombosis CT A/P noted irregular dilation of the main pancreatic duct with recommendation for EUS evaluation. No need for EUS at this time as patient has been worked up for ductal dilation in the past, found to have non-malignant pancreatic head mass, and has close GI follow up.Lipase elevated to 174 during hospitalization, stable from prior and without exam or CT findings to suggest current acute pancreatitis. -continue creon   COPD -continue home breo ellipta   Spinal stenosis peripheral neuropathy -oxycodone 7.5 mg QID -Continue duloxetine -continue lyrica -continue amitriptyline   BPH -continue flomax  Prior to Admission Living Arrangement: Anticipated Discharge Location: Barriers to Discharge: Dispo: Anticipated discharge in approximately 1 [place](s).   RogIona CoachD 08/15/2022, 2:24 PM Pager: [phone] 5pm on weekdays and 1pm on weekends: On Call pager [phone]

## 2022-08-15 NOTE — Discharge Summary (Signed)
Name: Wesley [name] MRN: [id number] DOB: [birth year] 56 y.o. PCP: [name], MD  Date of Admission: 08/14/2022  1:19 PM Date of Discharge: No discharge date for patient encounter. Attending Physician: Charise Killian, MD  Discharge Diagnosis: 1. Principal Problem:   Splenic abscess  ***  Discharge Medications: Allergies as of 08/15/2022   No Known Allergies   Med Rec must be completed prior to using this [facility]***       Disposition and follow-up:   [name] was discharged from [facility] in Stable condition.  At the hospital follow up visit please address:  1.  ***  2.  Labs / imaging needed at time of follow-up: CBC  3.  Pending labs/ test needing follow-up: NA  Follow-up Appointments:   Hospital Course by problem list: [name] is a 56 y/o male with a pmh of chronic pancreatitis, chronic HCV, COPD, PAD, alcoholic peripheral neuropathy, chronic thrombosis of splenic vein presenting with persistent LUQ pain.   Peripheral splenic subcapsular fluid collections Abscess vs hematoma Patient  seen 9/13 after a fall on Saturday where he hit his left abdomen on a dryer at his home. The pain was not well controlled and the patient had subjective fever, vomiting and 3 episodes of diarrhea 2 days ago, which prompted him to come in. He was found to have splenic fluid collections on CT c/f infected hematoma and discharged on augmentin and home oxy from his pain clinic. Patient re-presented for similar clinical scenario 9/14 with worsening pain and repeat CT showed subcapsular fluid collections with the largest measuring 6.7 x2.7 cm, similar to prior. He did not have epigastric ptp or inflammatory changes on CT to suggest pancreatitis. There were no urinary symptoms or inflammatory changes about the kidneys to suggest UTI/pyelonephritis. The patient is without a gall bladder. Do not suspect septic emboli from endocarditis given no other cutaneous or visceral  evidence of emboli and no murmur or sepsis. Given that his splenic imaging Dec 2022 was normal and now changed in the setting of trauma, does favor hematoma. HE had a WBC that peaked at 16K and downtrended to 11.9 on discharge s/p 2 days IV zosyn. The WBC could be secondary to inflammation but given response to antibiotics could represent infection of hematomas. No need for IR intervention as this is grade 2 splenic laceration that is contained and without contrast extravasation. No vaccinations needed as the patient has a functioning spleen. Pain was controlled with***. Patient will transition to the augmentin received at D/C 9/13 for completion of a 5 Pridgen total course of broad spectrum and anaerobic coverage.   Chronic pancreatitis Chronic splenic vein thrombosis CT A/P noted irregular dilation of the main pancreatic duct with recommendation for EUS evaluation. No need for EUS at this time as patient has been worked up for ductal dilation in the past, found to have non-malignant pancreatic head mass, and has close GI follow up.Lipase elevated to 174 during hospitalization, stable from prior and without exam or CT findings to suggest current acute pancreatitis. Continued home creon.   COPD Continued home breo ellipta.   Spinal stenosis peripheral neuropathy Continued home oxycodone 7.5 mg QID, duloxetine,lyrica,amitriptyline.   BPH Continued flomax.  Subjective Patient evaluated at bedside this AM in the ED. Continues to endorse LUQ pain that is persistent and radiating around his side to his back.. He says the quality of pain has changed and now is pulsatile. He also says the level of pain has  increased. Denies HA, dizziness, lightheadedness, substernal CP,SOB,N/V,urinary symptoms. He says home pain meds help temporarily not for very long.  Discharge Exam:   BP (!) 140/89   Pulse 99   Temp 98.3 F (36.8 C) (Oral)   Resp 16   Ht 5' 8" (1.727 m)   Wt 79 kg   SpO2 (!) 89%   BMI 26.48 kg/m   Discharge exam:  Gen: well nourished, well developed, slight distress, laying in bed CV: RRR, no m/r/g, 2+ bilateral radial pulses Pulm: normal WOB, LCTAB Abd: soft, non-distended, PTP about anterior LUQ and flank Extremities: warm, dry, no LE edema Skin: no subcutaneous nodules,no nail hemorrhage  Pertinent Labs, Studies, and Procedures:     Latest Ref Rng & Units 08/15/2022    4:00 AM 08/14/2022    1:38 PM 08/13/2022    9:30 AM  CBC  WBC 4.0 - 10.5 K/uL 11.9  16.0    Hemoglobin 13.0 - 17.0 g/dL 14.9  14.9  14.8   Hematocrit 39.0 - 52.0 % 43.2  43.6  43.2   Platelets 150 - 400 K/uL 376  436         Latest Ref Rng & Units 08/15/2022    4:00 AM 08/14/2022    1:38 PM 08/13/2022    4:43 AM  BMP  Glucose 70 - 99 mg/dL 105  98  112   BUN 6 - 20 mg/dL _0 Creatinine 0.61 - 1.24 mg/dL 1.11  1.05  1.10   Sodium 135 - 145 mmol/L 139  138  139   Potassium 3.5 - 5.1 mmol/L 2.9  3.0  3.0   Chloride 98 - 111 mmol/L 100  102  99   CO2 22 - 32 mmol/L _1 Calcium 8.9 - 10.3 mg/dL 9.2  9.2  9.3    CT ABDOMEN PELVIS W CONTRAST  Result Date: 08/14/2022 CLINICAL DATA:  Left upper quadrant abdominal pain. EXAM: CT ABDOMEN AND PELVIS WITH CONTRAST TECHNIQUE: Multidetector CT imaging of the abdomen and pelvis was performed using the standard protocol following bolus administration of intravenous contrast. RADIATION DOSE REDUCTION: This exam was performed according to the departmental dose-optimization program which includes automated exposure control, adjustment of the mA and/or kV according to patient size and/or use of iterative reconstruction technique. CONTRAST:  69m OMNIPAQUE IOHEXOL 350 MG/ML SOLN COMPARISON:  August 13, 2022 FINDINGS: Lower chest: Bibasilar streaky airspace opacities. Small left pleural effusion. Hepatobiliary: Multiple liver cysts.  Post cholecystectomy. Pancreas: Atrophic appearance of the body and tail of the pancreas with irregular dilation of the main  pancreatic duct, particularly in the tail of the pancreas. Spleen: Persistent abnormal appearance of the spleen with multiple peripheral subcapsular fluid collections the largest measuring 6.7 by 2.7 cm. Perisplenic fat stranding and capsular thickening. Adrenals/Urinary Tract: Adrenal glands are unremarkable. Kidneys are normal, without renal calculi, focal lesion, or hydronephrosis. Bladder is unremarkable. Stomach/Bowel: Stomach is within normal limits. Appendix appears normal. No evidence of bowel wall thickening, distention, or inflammatory changes. Vascular/Lymphatic: No significant vascular findings are present. No enlarged abdominal or pelvic lymph nodes. Reproductive: Prostate is unremarkable. Other: No abdominal wall hernia or abnormality. No abdominopelvic ascites. Musculoskeletal: Moderate disc narrowing at L5-S1 with extensive sclerotic changes of the bordering vertebral bodies. IMPRESSION: 1. Persistent abnormal appearance of the spleen with multiple peripheral subcapsular fluid collections the largest measuring 6.7 x 2.7 cm. Perisplenic fat stranding and capsular thickening. These findings may represent sequela of  splenic laceration, splenic infarcts, or splenic abscess. 2. Atrophic appearance of the body and tail of the pancreas with irregular dilation of the main pancreatic duct, particularly in the tail of the pancreas. Further evaluation with EUS is suggested. 3. Bibasilar streaky airspace opacities with small left pleural effusion. 4. Moderate disc narrowing at L5-S1 with extensive sclerotic changes of the bordering vertebral bodies. This may be due to degenerative changes, however chronic discitis/osteomyelitis is also a consideration. Electronically Signed   By: Fidela Salisbury M.D.   On: 08/14/2022 16:53     Discharge Instructions:   Signed: Iona Coach, MD 08/15/2022, 12:15 PM   Pager: [phone]

## 2022-08-16 DIAGNOSIS — D733 Abscess of spleen: Secondary | ICD-10-CM

## 2022-08-16 LAB — CBC
HCT: 45.7 % (ref 39.0–52.0)
Hemoglobin: 15.6 g/dL (ref 13.0–17.0)
MCH: 31.3 pg (ref 26.0–34.0)
MCHC: 34.1 g/dL (ref 30.0–36.0)
MCV: 91.6 fL (ref 80.0–100.0)
Platelets: 426 10*3/uL — ABNORMAL HIGH (ref 150–400)
RBC: 4.99 MIL/uL (ref 4.22–5.81)
RDW: 17.7 % — ABNORMAL HIGH (ref 11.5–15.5)
WBC: 13.4 10*3/uL — ABNORMAL HIGH (ref 4.0–10.5)
nRBC: 0 % (ref 0.0–0.2)

## 2022-08-16 LAB — BASIC METABOLIC PANEL
Anion gap: 11 (ref 5–15)
BUN: 8 mg/dL (ref 6–20)
CO2: 24 mmol/L (ref 22–32)
Calcium: 9 mg/dL (ref 8.9–10.3)
Chloride: 100 mmol/L (ref 98–111)
Creatinine, Ser: 1.18 mg/dL (ref 0.61–1.24)
GFR, Estimated: >60 mL/min (ref >60)
Glucose, Bld: 175 mg/dL — ABNORMAL HIGH (ref 70–99)
Potassium: 3.5 mmol/L (ref 3.5–5.1)
Sodium: 135 mmol/L (ref 135–145)

## 2022-08-16 MED ORDER — POLYETHYLENE GLYCOL 3350 17 G PO PACK
17.0000 g | PACK | Freq: Once | ORAL | Status: AC
  Administered 2022-08-16: 17 g via ORAL
  Filled 2022-08-16: qty 1

## 2022-08-16 MED ORDER — SENNOSIDES-DOCUSATE SODIUM 8.6-50 MG PO TABS: 1.0000 | ORAL_TABLET | Freq: Every evening | ORAL | Status: DC | PRN

## 2022-08-16 MED ORDER — OXYCODONE HCL 5 MG PO TABS
15.0000 mg | ORAL_TABLET | Freq: Four times a day (QID) | ORAL | Status: DC
  Administered 2022-08-16 – 2022-08-17 (×5): 15 mg via ORAL
  Filled 2022-08-16 (×5): qty 3

## 2022-08-16 MED ORDER — SENNOSIDES-DOCUSATE SODIUM 8.6-50 MG PO TABS
1.0000 | ORAL_TABLET | Freq: Once | ORAL | Status: AC
  Administered 2022-08-16: 1 via ORAL
  Filled 2022-08-16: qty 1

## 2022-08-16 MED ORDER — PIPERACILLIN-TAZOBACTAM 3.375 G IVPB
3.3750 g | Freq: Three times a day (TID) | INTRAVENOUS | Status: DC
  Administered 2022-08-16 – 2022-08-17 (×3): 3.375 g via INTRAVENOUS
  Filled 2022-08-16 (×3): qty 50

## 2022-08-16 NOTE — Progress Notes (Signed)
Mobility Specialist Progress Note:   08/16/22 1625  Mobility  Activity Ambulated independently in hallway  Level of Assistance Independent  Assistive Device None  Distance Ambulated (ft) 500 ft  Activity Response Tolerated well  $Mobility charge 1 Mobility   Pt received in bed and agreeable. No complaints. Pt left in bed with all needs met and call bell in reach.   Joylene Wescott Mobility Specialist-Acute Rehab Secure Chat only

## 2022-08-16 NOTE — Progress Notes (Signed)
Subjective:   Hospital Shaff: 2  Overnight event: No acute events overnight  Interim History: Patient evaluated laying slightly uncomfortable in bed. Continues to endorse pain in his left upper quadrant. Denies any fevers, chills, nausea, vomiting or diarrhea.  Endorses constipation and reports she has not had a bowel movement in 2-3 days.   Objective:  Vital signs in last 24 hours: Vitals:   08/15/22 1708 08/15/22 2154 08/16/22 0003 08/16/22 0455  BP: (!) 125/94 113/83 (!) 133/93 127/84  Pulse:  87 77 78  Resp: _0 Temp: 97.8 F (36.6 C) 97.8 F (36.6 C) 97.9 F (36.6 C) 97.8 F (36.6 C)  TempSrc: Oral Oral Oral Oral  SpO2: 92% 92% (!) 85% 93%  Weight:      Height:        Filed Weights   08/14/22 1326 08/15/22 1356  Weight: 79 kg 75.6 kg     Intake/Output Summary (Last 24 hours) at 08/16/2022 0723 Last data filed at 08/16/2022 0254 Gross per 24 hour  Intake 301.89 ml  Output 750 ml  Net -448.11 ml   Net IO Since Admission: -448.11 mL [08/16/22 0723]  No results for input(s): "GLUCAP" in the last 72 hours.   Pertinent Labs:    Latest Ref Rng & Units 08/15/2022    4:00 AM 08/14/2022    1:38 PM 08/13/2022    9:30 AM  CBC  WBC 4.0 - 10.5 K/uL 11.9  16.0    Hemoglobin 13.0 - 17.0 g/dL 14.9  14.9  14.8   Hematocrit 39.0 - 52.0 % 43.2  43.6  43.2   Platelets 150 - 400 K/uL 376  436         Latest Ref Rng & Units 08/15/2022    4:00 AM 08/14/2022    1:38 PM 08/13/2022    4:43 AM  CMP  Glucose 70 - 99 mg/dL 105  98  112   BUN 6 - 20 mg/dL _1 Creatinine 0.61 - 1.24 mg/dL 1.11  1.05  1.10   Sodium 135 - 145 mmol/L 139  138  139   Potassium 3.5 - 5.1 mmol/L 2.9  3.0  3.0   Chloride 98 - 111 mmol/L 100  102  99   CO2 22 - 32 mmol/L _2 Calcium 8.9 - 10.3 mg/dL 9.2  9.2  9.3   Total Protein 6.5 - 8.1 g/dL  8.0  8.1   Total Bilirubin 0.3 - 1.2 mg/dL  0.8  1.3   Alkaline Phos 38 - 126 U/L  86  105   AST 15 - 41 U/L  21  21   ALT 0 - 44 U/L   17  19     Imaging: No results found.  Physical Exam  General: Pleasant, well-appearing middle-age man laying in bed. No acute distress. CV: RRR. No m/r/g. No LE edema Pulmonary: Lungs CTAB. Normal effort. No wheezing or rales. Abdominal: Mildly distended abdomen. Moderate tenderness to palpation of the LUQ.  Hypoactive bowel sounds. Extremities: 2+ distal pulses. Normal ROM. Skin: Warm and dry. No obvious rash or lesions. Neuro: A&Ox3. Moves all extremities. Normal sensation to gross touch.  Psych: Normal mood and affect  Assessment/Plan: Wesley [name] is a 56 y.o. male with hx of chronic pancreatitis, chronic HCV, COPD, PAD, alcoholic peripheral neuropathy, chronic thrombosis of splenic vein presenting with persistent LUQ pain.  Principal Problem:   Splenic abscess  Peripheral splenic subcapsular hematomas C/f infection of hematoma Patient continues to complain of left upper quadrant abdominal pain that is not significantly controlled with current pain regimen. He endorsed some constipation but denies any nausea, vomiting, fevers, or chills. Mild bump in white count however patient remains afebrile. Currently on home dose of Oxy 7.5 mg every 6 hours and has required 2 doses of as needed oxy 5 mg. Due to his high pain tolerance, we will escalate his pain regimen for better pain control with plan to discharge home tomorrow morning on oral antibiotics. -Continue IV Zosyn -Increase oxycodone from 7.5 mg q6h to 15 mg q6h -Discontinue PRN oxycodone 5 mg -Continue Tylenol 1000 mg 3 times daily -Continue ibuprofen 400 mg q6h for 5 days (Hoheisel 2/5) -Continue lidocaine patch -Bowel regimen with MiraLAX and Senokot X  Chronic pancreatitis Chronic splenic vein thrombosis CT A/P noted irregular dilation of the main pancreatic duct with recommendation for EUS evaluation. No need for EUS at this time as patient has been worked up for ductal dilation in the past, found to have non-malignant  pancreatic head mass.  -Continue [name] with meals and snacks -Outpatient GI follow-up   COPD Respiratory status remained stable. Lungs clear to auscultation.  Remains on room air. -Continue home breo ellipta   Spinal stenosis peripheral neuropathy Escalate pain regimen as above. Discussed plan for patient to follow-up with his pain specialist in the outpatient after discharge. -Increase oxycodone to 15 mg every 6 hours. -Continue duloxetine -Continue lyrica -Continue amitriptyline   BPH -Continue flomax  Diet: Regular IVF: None VTE: Lovenox CODE: Full  Prior to Admission Living Arrangement: Home Anticipated Discharge Location: Home Barriers to Discharge: Pain control Dispo: Anticipated discharge tomorrow  Signed: Lacinda Axon, MD 08/16/2022, 7:23 AM  Pager: [phone] Internal Medicine Teaching Service After 5pm on weekdays and 1pm on weekends: On Call pager: [phone]

## 2022-08-17 DIAGNOSIS — D733 Abscess of spleen: Secondary | ICD-10-CM | POA: Diagnosis not present

## 2022-08-17 MED ORDER — IBUPROFEN 400 MG PO TABS: 400.0000 mg | ORAL_TABLET | Freq: Four times a day (QID) | ORAL | 0 refills | Status: DC

## 2022-08-17 MED ORDER — SENNA 8.6 MG PO TABS: 1.0000 | ORAL_TABLET | Freq: Every day | ORAL | 1 refills | Status: DC

## 2022-08-17 MED ORDER — POLYETHYLENE GLYCOL 3350 17 G PO PACK
17.0000 g | PACK | Freq: Once | ORAL | Status: AC
  Administered 2022-08-17: 17 g via ORAL
  Filled 2022-08-17: qty 1

## 2022-08-17 MED ORDER — SENNOSIDES-DOCUSATE SODIUM 8.6-50 MG PO TABS
2.0000 | ORAL_TABLET | Freq: Once | ORAL | Status: AC
  Administered 2022-08-17: 2 via ORAL
  Filled 2022-08-17: qty 2

## 2022-08-17 MED ORDER — OXYCODONE HCL 10 MG PO TABS: 10.0000 mg | ORAL_TABLET | Freq: Four times a day (QID) | ORAL | 0 refills | Status: AC

## 2022-08-17 MED ORDER — AMOXICILLIN-POT CLAVULANATE 875-125 MG PO TABS: 1.0000 | ORAL_TABLET | Freq: Two times a day (BID) | ORAL | 0 refills | Status: AC

## 2022-08-17 MED ORDER — LIDOCAINE 5 % EX PTCH: 1.0000 | MEDICATED_PATCH | CUTANEOUS | 0 refills | Status: DC

## 2022-08-17 MED ORDER — ACETAMINOPHEN 500 MG PO TABS: 1000.0000 mg | ORAL_TABLET | Freq: Three times a day (TID) | ORAL | 0 refills | Status: DC

## 2022-08-17 NOTE — Discharge Instructions (Addendum)
Wesley [name], It was a pleasure taking care of you at [place] were admitted for worsening of your left upper quadrant pain which was thought to be due to a possible infected splenic hematoma. We are discharging you home now that your pain is better. Please follow the following instructions.  1) Take oxycodone 10 mg every 6 hours, make sure to take senna daily and as needed MiraLAX to help with constipation 2) Continue taking the Augmentin every 12 hours for 4 additional days 3) Please make sure to follow-up with your pain doctor later this month and your primary doctor as soon as possible.  Take care,  Dr. Linwood Dibbles, MD, MPH

## 2022-08-17 NOTE — Progress Notes (Signed)
Mobility Specialist Progress Note:   08/17/22 1009  Mobility  Activity Ambulated independently in hallway  Level of Assistance Independent  Assistive Device None  Distance Ambulated (ft) 500 ft  Activity Response Tolerated well  $Mobility charge 1 Mobility   Pt received in bed and eager. No complaints. Pt left ambulating in room with all needs met and call bell in reach.   Alicen Donalson Mobility Specialist-Acute Rehab Secure Chat only

## 2022-08-18 ENCOUNTER — Telehealth: Payer: Self-pay

## 2022-08-18 NOTE — Telephone Encounter (Signed)
Transition Care Management Unsuccessful Follow-up Telephone Call  Date of discharge and from where:  08/17/2022, [facility]  Attempts:  1st Attempt  Reason for unsuccessful TCM follow-up call:  Left voice message  on [phone] and [phone], call back requested

## 2022-08-19 ENCOUNTER — Telehealth: Payer: Self-pay

## 2022-08-19 NOTE — Telephone Encounter (Signed)
Transition Care Management Follow-up Telephone Call Date of discharge and from where: 08/17/2022, [facility] How have you been since you were released from the hospital? He said he still hurts, but is okay.  Any questions or concerns? Yes- he has not been able to get the lidocaine patch. The pharmacy is waiting for a prior auth.   Items Reviewed: Did the pt receive and understand the discharge instructions provided? Yes  Medications obtained and verified?  He has all meds except the lidocaine patch Other? No  Any new allergies since your discharge? No  Dietary orders reviewed? No Do you have support at home? Yes   Home Care and Equipment/Supplies: Were home health services ordered? no If so, what is the name of the agency? N/a  Has the agency set up a time to come to the patient's home? not applicable Were any new equipment or medical supplies ordered?  No What is the name of the medical supply agency? N/a Were you able to get the supplies/equipment? not applicable Do you have any questions related to the use of the equipment or supplies? No  Functional Questionnaire: (I = Independent and D = Dependent) ADLs: independent  Follow up appointments reviewed:  PCP Hospital f/u appt confirmed? Yes  Scheduled to see Epimenio Sarin, PA- 09/11/2022.   [facility] f/u appt confirmed?  None scheduled at this time  . Are transportation arrangements needed? No  If their condition worsens, is the pt aware to call PCP or go to the Emergency Dept.? Yes Was the patient provided with contact information for the PCP's office or ED? Yes Was to pt encouraged to call back with questions or concerns? Yes

## 2022-08-19 NOTE — Telephone Encounter (Signed)
Lurena Joiner can you help get PA approved for lidocaine patch ordered at hosp for this pt?

## 2022-08-20 ENCOUNTER — Other Ambulatory Visit: Payer: Self-pay

## 2022-08-20 NOTE — Telephone Encounter (Signed)
Yes sir, I will see what his insurance company says. It may be denied as the 4% patches are available OTC. Usually, [place] will deny rxs if they are available OTC, even if there is a strength issue. Let me see what I can do.

## 2022-08-20 NOTE — Telephone Encounter (Signed)
Dr. Joya Gaskins,   Was able to work with Claiborne Billings to get PA approved for lidocaine patches pt under dx of M54.41 - chronic, midline lower back pain with bilateral sciatica. Pharmacy will be notified and rx filled for the patient.

## 2022-08-27 ENCOUNTER — Encounter: Payer: Self-pay | Admitting: Nurse Practitioner

## 2022-08-27 ENCOUNTER — Inpatient Hospital Stay (HOSPITAL_COMMUNITY)
Admission: EM | Admit: 2022-08-27 | Discharge: 2022-09-03 | DRG: 640 | Disposition: A | Discharge status: Home / Self Care | Payer: Medicare Other | Attending: Internal Medicine | Admitting: Internal Medicine

## 2022-08-27 ENCOUNTER — Emergency Department (HOSPITAL_COMMUNITY): Payer: Medicare Other

## 2022-08-27 ENCOUNTER — Other Ambulatory Visit: Payer: Self-pay

## 2022-08-27 ENCOUNTER — Encounter (HOSPITAL_COMMUNITY): Payer: Self-pay | Admitting: Emergency Medicine

## 2022-08-27 ENCOUNTER — Ambulatory Visit: Payer: Medicare Other | Attending: Physician Assistant | Admitting: Nurse Practitioner

## 2022-08-27 VITALS — BP 125/83 | HR 88 | Temp 98.7°F | Resp 20 | Ht 68.0 in | Wt 167.0 lb

## 2022-08-27 DIAGNOSIS — E871 Hypo-osmolality and hyponatremia: Secondary | ICD-10-CM | POA: Diagnosis not present

## 2022-08-27 DIAGNOSIS — G8929 Other chronic pain: Secondary | ICD-10-CM | POA: Diagnosis not present

## 2022-08-27 DIAGNOSIS — Z803 Family history of malignant neoplasm of breast: Secondary | ICD-10-CM

## 2022-08-27 DIAGNOSIS — K219 Gastro-esophageal reflux disease without esophagitis: Secondary | ICD-10-CM | POA: Diagnosis present

## 2022-08-27 DIAGNOSIS — B3781 Candidal esophagitis: Secondary | ICD-10-CM | POA: Diagnosis present

## 2022-08-27 DIAGNOSIS — R1314 Dysphagia, pharyngoesophageal phase: Secondary | ICD-10-CM | POA: Diagnosis present

## 2022-08-27 DIAGNOSIS — Z09 Encounter for follow-up examination after completed treatment for conditions other than malignant neoplasm: Secondary | ICD-10-CM | POA: Diagnosis not present

## 2022-08-27 DIAGNOSIS — Z8719 Personal history of other diseases of the digestive system: Secondary | ICD-10-CM

## 2022-08-27 DIAGNOSIS — K863 Pseudocyst of pancreas: Secondary | ICD-10-CM | POA: Diagnosis present

## 2022-08-27 DIAGNOSIS — G629 Polyneuropathy, unspecified: Secondary | ICD-10-CM | POA: Diagnosis not present

## 2022-08-27 DIAGNOSIS — R911 Solitary pulmonary nodule: Secondary | ICD-10-CM | POA: Diagnosis not present

## 2022-08-27 DIAGNOSIS — E872 Acidosis, unspecified: Secondary | ICD-10-CM | POA: Diagnosis not present

## 2022-08-27 DIAGNOSIS — K648 Other hemorrhoids: Secondary | ICD-10-CM | POA: Diagnosis present

## 2022-08-27 DIAGNOSIS — T792XXD Traumatic secondary and recurrent hemorrhage and seroma, subsequent encounter: Secondary | ICD-10-CM | POA: Diagnosis not present

## 2022-08-27 DIAGNOSIS — M545 Low back pain, unspecified: Secondary | ICD-10-CM | POA: Diagnosis present

## 2022-08-27 DIAGNOSIS — D75839 Thrombocytosis, unspecified: Secondary | ICD-10-CM | POA: Diagnosis not present

## 2022-08-27 DIAGNOSIS — K5900 Constipation, unspecified: Secondary | ICD-10-CM | POA: Diagnosis not present

## 2022-08-27 DIAGNOSIS — K635 Polyp of colon: Secondary | ICD-10-CM | POA: Diagnosis not present

## 2022-08-27 DIAGNOSIS — K861 Other chronic pancreatitis: Secondary | ICD-10-CM | POA: Diagnosis not present

## 2022-08-27 DIAGNOSIS — J439 Emphysema, unspecified: Secondary | ICD-10-CM | POA: Diagnosis not present

## 2022-08-27 DIAGNOSIS — I82891 Chronic embolism and thrombosis of other specified veins: Secondary | ICD-10-CM | POA: Diagnosis present

## 2022-08-27 DIAGNOSIS — N400 Enlarged prostate without lower urinary tract symptoms: Secondary | ICD-10-CM | POA: Diagnosis present

## 2022-08-27 DIAGNOSIS — F1011 Alcohol abuse, in remission: Secondary | ICD-10-CM | POA: Diagnosis present

## 2022-08-27 DIAGNOSIS — I2510 Atherosclerotic heart disease of native coronary artery without angina pectoris: Secondary | ICD-10-CM | POA: Diagnosis not present

## 2022-08-27 DIAGNOSIS — J4489 Other specified chronic obstructive pulmonary disease: Secondary | ICD-10-CM | POA: Diagnosis not present

## 2022-08-27 DIAGNOSIS — Z800 Family history of malignant neoplasm of digestive organs: Secondary | ICD-10-CM

## 2022-08-27 DIAGNOSIS — K259 Gastric ulcer, unspecified as acute or chronic, without hemorrhage or perforation: Secondary | ICD-10-CM | POA: Diagnosis not present

## 2022-08-27 DIAGNOSIS — I739 Peripheral vascular disease, unspecified: Secondary | ICD-10-CM | POA: Diagnosis present

## 2022-08-27 DIAGNOSIS — Z7951 Long term (current) use of inhaled steroids: Secondary | ICD-10-CM

## 2022-08-27 DIAGNOSIS — R09A2 Foreign body sensation, throat: Secondary | ICD-10-CM | POA: Diagnosis present

## 2022-08-27 DIAGNOSIS — G621 Alcoholic polyneuropathy: Secondary | ICD-10-CM | POA: Diagnosis present

## 2022-08-27 DIAGNOSIS — R112 Nausea with vomiting, unspecified: Secondary | ICD-10-CM | POA: Diagnosis present

## 2022-08-27 DIAGNOSIS — S36031D Moderate laceration of spleen, subsequent encounter: Secondary | ICD-10-CM

## 2022-08-27 DIAGNOSIS — Z8051 Family history of malignant neoplasm of kidney: Secondary | ICD-10-CM

## 2022-08-27 DIAGNOSIS — R109 Unspecified abdominal pain: Secondary | ICD-10-CM | POA: Diagnosis not present

## 2022-08-27 DIAGNOSIS — Z86718 Personal history of other venous thrombosis and embolism: Secondary | ICD-10-CM

## 2022-08-27 DIAGNOSIS — K8689 Other specified diseases of pancreas: Secondary | ICD-10-CM

## 2022-08-27 DIAGNOSIS — I700 Atherosclerosis of aorta: Secondary | ICD-10-CM | POA: Diagnosis not present

## 2022-08-27 DIAGNOSIS — Z79899 Other long term (current) drug therapy: Secondary | ICD-10-CM

## 2022-08-27 DIAGNOSIS — Z560 Unemployment, unspecified: Secondary | ICD-10-CM

## 2022-08-27 DIAGNOSIS — N179 Acute kidney failure, unspecified: Secondary | ICD-10-CM | POA: Diagnosis present

## 2022-08-27 DIAGNOSIS — Z820 Family history of epilepsy and other diseases of the nervous system: Secondary | ICD-10-CM

## 2022-08-27 DIAGNOSIS — K319 Disease of stomach and duodenum, unspecified: Secondary | ICD-10-CM | POA: Diagnosis present

## 2022-08-27 DIAGNOSIS — K859 Acute pancreatitis without necrosis or infection, unspecified: Secondary | ICD-10-CM | POA: Diagnosis present

## 2022-08-27 DIAGNOSIS — S36029D Unspecified contusion of spleen, subsequent encounter: Principal | ICD-10-CM

## 2022-08-27 DIAGNOSIS — K760 Fatty (change of) liver, not elsewhere classified: Secondary | ICD-10-CM | POA: Diagnosis present

## 2022-08-27 DIAGNOSIS — F17210 Nicotine dependence, cigarettes, uncomplicated: Secondary | ICD-10-CM | POA: Diagnosis present

## 2022-08-27 DIAGNOSIS — K2950 Unspecified chronic gastritis without bleeding: Secondary | ICD-10-CM | POA: Diagnosis present

## 2022-08-27 DIAGNOSIS — Z8249 Family history of ischemic heart disease and other diseases of the circulatory system: Secondary | ICD-10-CM

## 2022-08-27 DIAGNOSIS — B182 Chronic viral hepatitis C: Secondary | ICD-10-CM | POA: Diagnosis present

## 2022-08-27 DIAGNOSIS — J90 Pleural effusion, not elsewhere classified: Secondary | ICD-10-CM | POA: Diagnosis not present

## 2022-08-27 DIAGNOSIS — Z86010 Personal history of colonic polyps: Secondary | ICD-10-CM

## 2022-08-27 DIAGNOSIS — D735 Infarction of spleen: Secondary | ICD-10-CM

## 2022-08-27 DIAGNOSIS — J449 Chronic obstructive pulmonary disease, unspecified: Secondary | ICD-10-CM | POA: Diagnosis present

## 2022-08-27 DIAGNOSIS — S3600XD Unspecified injury of spleen, subsequent encounter: Secondary | ICD-10-CM | POA: Insufficient documentation

## 2022-08-27 LAB — URINALYSIS, ROUTINE W REFLEX MICROSCOPIC
Bilirubin Urine: NEGATIVE
Glucose, UA: NEGATIVE mg/dL
Hgb urine dipstick: NEGATIVE
Ketones, ur: NEGATIVE mg/dL
Leukocytes,Ua: NEGATIVE
Nitrite: NEGATIVE
Protein, ur: NEGATIVE mg/dL
Specific Gravity, Urine: 1.003 — ABNORMAL LOW (ref 1.005–1.030)
pH: 5 (ref 5.0–8.0)

## 2022-08-27 LAB — CBC WITH DIFFERENTIAL/PLATELET
Abs Immature Granulocytes: 0.06 10*3/uL (ref 0.00–0.07)
Basophils Absolute: 0.1 10*3/uL (ref 0.0–0.1)
Basophils Relative: 1 %
Eosinophils Absolute: 0.4 10*3/uL (ref 0.0–0.5)
Eosinophils Relative: 3 %
HCT: 43 % (ref 39.0–52.0)
Hemoglobin: 15.3 g/dL (ref 13.0–17.0)
Immature Granulocytes: 1 %
Lymphocytes Relative: 14 %
Lymphs Abs: 1.7 10*3/uL (ref 0.7–4.0)
MCH: 31.2 pg (ref 26.0–34.0)
MCHC: 35.6 g/dL (ref 30.0–36.0)
MCV: 87.6 fL (ref 80.0–100.0)
Monocytes Absolute: 0.9 10*3/uL (ref 0.1–1.0)
Monocytes Relative: 7 %
Neutro Abs: 9.2 10*3/uL — ABNORMAL HIGH (ref 1.7–7.7)
Neutrophils Relative %: 74 %
Platelets: 668 10*3/uL — ABNORMAL HIGH (ref 150–400)
RBC: 4.91 MIL/uL (ref 4.22–5.81)
RDW: 16.9 % — ABNORMAL HIGH (ref 11.5–15.5)
WBC: 12.4 10*3/uL — ABNORMAL HIGH (ref 4.0–10.5)
nRBC: 0 % (ref 0.0–0.2)

## 2022-08-27 LAB — COMPREHENSIVE METABOLIC PANEL
ALT: 11 U/L (ref 0–44)
AST: 17 U/L (ref 15–41)
Albumin: 2.8 g/dL — ABNORMAL LOW (ref 3.5–5.0)
Alkaline Phosphatase: 79 U/L (ref 38–126)
Anion gap: 11 (ref 5–15)
BUN: <5 mg/dL — ABNORMAL LOW (ref 6–20)
CO2: 24 mmol/L (ref 22–32)
Calcium: 9.2 mg/dL (ref 8.9–10.3)
Chloride: 98 mmol/L (ref 98–111)
Creatinine, Ser: 1.13 mg/dL (ref 0.61–1.24)
GFR, Estimated: >60 mL/min (ref >60)
Glucose, Bld: 105 mg/dL — ABNORMAL HIGH (ref 70–99)
Potassium: 3.3 mmol/L — ABNORMAL LOW (ref 3.5–5.1)
Sodium: 133 mmol/L — ABNORMAL LOW (ref 135–145)
Total Bilirubin: 0.6 mg/dL (ref 0.3–1.2)
Total Protein: 8.2 g/dL — ABNORMAL HIGH (ref 6.5–8.1)

## 2022-08-27 LAB — LACTIC ACID, PLASMA: Lactic Acid, Venous: 0.9 mmol/L (ref 0.5–1.9)

## 2022-08-27 LAB — LIPASE, BLOOD: Lipase: 201 U/L — ABNORMAL HIGH (ref 11–51)

## 2022-08-27 MED ORDER — HYDROMORPHONE HCL 1 MG/ML IJ SOLN
0.5000 mg | Freq: Four times a day (QID) | INTRAMUSCULAR | Status: DC | PRN
  Administered 2022-08-27 – 2022-08-29 (×5): 0.5 mg via INTRAVENOUS
  Filled 2022-08-27: qty 1
  Filled 2022-08-27 (×4): qty 0.5

## 2022-08-27 MED ORDER — OXYCODONE HCL 5 MG PO TABS: 5.0000 mg | ORAL_TABLET | ORAL | Status: DC | PRN

## 2022-08-27 MED ORDER — ALBUTEROL SULFATE HFA 108 (90 BASE) MCG/ACT IN AERS: 2.0000 | INHALATION_SPRAY | Freq: Four times a day (QID) | RESPIRATORY_TRACT | Status: DC | PRN

## 2022-08-27 MED ORDER — POTASSIUM CHLORIDE 20 MEQ PO PACK
40.0000 meq | PACK | Freq: Two times a day (BID) | ORAL | Status: DC
  Administered 2022-08-27 – 2022-09-03 (×14): 40 meq via ORAL
  Filled 2022-08-27 (×14): qty 2

## 2022-08-27 MED ORDER — PANCRELIPASE (LIP-PROT-AMYL) 36000-114000 UNITS PO CPEP
72000.0000 [IU] | ORAL_CAPSULE | Freq: Three times a day (TID) | ORAL | Status: DC
  Administered 2022-08-27 – 2022-09-03 (×16): 72000 [IU] via ORAL
  Filled 2022-08-27 (×20): qty 2

## 2022-08-27 MED ORDER — IOHEXOL 350 MG/ML SOLN
75.0000 mL | Freq: Once | INTRAVENOUS | Status: AC | PRN
  Administered 2022-08-27: 75 mL via INTRAVENOUS

## 2022-08-27 MED ORDER — TAMSULOSIN HCL 0.4 MG PO CAPS
0.4000 mg | ORAL_CAPSULE | Freq: Every day | ORAL | Status: DC
  Administered 2022-08-27 – 2022-09-02 (×7): 0.4 mg via ORAL
  Filled 2022-08-27 (×7): qty 1

## 2022-08-27 MED ORDER — PANTOPRAZOLE SODIUM 40 MG PO TBEC
40.0000 mg | DELAYED_RELEASE_TABLET | Freq: Two times a day (BID) | ORAL | Status: DC
  Administered 2022-08-28 – 2022-09-03 (×12): 40 mg via ORAL
  Filled 2022-08-27 (×13): qty 1

## 2022-08-27 MED ORDER — PANCRELIPASE (LIP-PROT-AMYL) 36000-114000 UNITS PO CPEP
36000.0000 [IU] | ORAL_CAPSULE | ORAL | Status: DC
  Administered 2022-08-29 – 2022-09-03 (×6): 36000 [IU] via ORAL
  Filled 2022-08-27 (×6): qty 1

## 2022-08-27 MED ORDER — OXYCODONE HCL 5 MG PO TABS
7.5000 mg | ORAL_TABLET | ORAL | Status: DC | PRN
  Administered 2022-08-28 – 2022-08-29 (×3): 7.5 mg via ORAL
  Filled 2022-08-27 (×4): qty 2

## 2022-08-27 MED ORDER — FLUTICASONE FUROATE-VILANTEROL 200-25 MCG/ACT IN AEPB
1.0000 | INHALATION_SPRAY | Freq: Every day | RESPIRATORY_TRACT | Status: DC
  Administered 2022-08-28 – 2022-09-03 (×7): 1 via RESPIRATORY_TRACT
  Filled 2022-08-27: qty 28

## 2022-08-27 MED ORDER — ONDANSETRON HCL 4 MG PO TABS
4.0000 mg | ORAL_TABLET | Freq: Three times a day (TID) | ORAL | Status: DC | PRN
  Administered 2022-08-27: 4 mg via ORAL
  Filled 2022-08-27 (×4): qty 1

## 2022-08-27 MED ORDER — PANCRELIPASE (LIP-PROT-AMYL) 36000-114000 UNITS PO CPEP: 36000.0000 [IU] | ORAL_CAPSULE | Freq: Three times a day (TID) | ORAL | Status: DC

## 2022-08-27 MED ORDER — PREGABALIN 75 MG PO CAPS
150.0000 mg | ORAL_CAPSULE | Freq: Every day | ORAL | Status: DC
  Administered 2022-08-27 – 2022-09-02 (×7): 150 mg via ORAL
  Filled 2022-08-27 (×7): qty 2

## 2022-08-27 MED ORDER — ALBUTEROL SULFATE (2.5 MG/3ML) 0.083% IN NEBU: 2.5000 mg | INHALATION_SOLUTION | Freq: Four times a day (QID) | RESPIRATORY_TRACT | Status: DC | PRN

## 2022-08-27 MED ORDER — HEPARIN SODIUM (PORCINE) 5000 UNIT/ML IJ SOLN
5000.0000 [IU] | Freq: Three times a day (TID) | INTRAMUSCULAR | Status: DC
  Administered 2022-08-27 – 2022-09-03 (×21): 5000 [IU] via SUBCUTANEOUS
  Filled 2022-08-27 (×22): qty 1

## 2022-08-27 MED ORDER — HYDROMORPHONE HCL 1 MG/ML IJ SOLN
1.0000 mg | Freq: Once | INTRAMUSCULAR | Status: AC
  Administered 2022-08-27: 1 mg via INTRAVENOUS
  Filled 2022-08-27: qty 1

## 2022-08-27 MED ORDER — ACETAMINOPHEN 325 MG PO TABS
650.0000 mg | ORAL_TABLET | Freq: Four times a day (QID) | ORAL | Status: DC | PRN
  Filled 2022-08-27: qty 2

## 2022-08-27 MED ORDER — ACETAMINOPHEN 650 MG RE SUPP: 650.0000 mg | Freq: Four times a day (QID) | RECTAL | Status: DC | PRN

## 2022-08-27 MED ORDER — POLYETHYLENE GLYCOL 3350 17 G PO PACK
17.0000 g | PACK | Freq: Every day | ORAL | Status: DC
  Administered 2022-08-27 – 2022-09-03 (×8): 17 g via ORAL
  Filled 2022-08-27 (×8): qty 1

## 2022-08-27 NOTE — ED Triage Notes (Signed)
Patient here for evaluation of continued abdominal pain, recently diagnosed with splenic abscess. Patient is alert, oriented, and in no apparent distress at this time.

## 2022-08-27 NOTE — Progress Notes (Signed)
HFU- ruptured spleen Guarding LUQ pain, 10/10 constant Relief with pain medication

## 2022-08-27 NOTE — ED Provider Triage Note (Signed)
Emergency Medicine Provider Triage Evaluation Note  Wesley [name] , a 56 y.o. male  was evaluated in triage.  Pt complains of Left upper quadrant abdominal pain.  He has a history of chronic pancreatitis was recently hospitalized with a splenic abscess.  He saw his PCP who sent him back to the emergency department today for reassessment as he has worsening pain and abdominal swelling.  He complains of severe left upper quadrant abdominal pain.  Denies fevers chills.  He complains of shortness of breath due to pain with movement of the diaphragm on the left side.  Review of Systems  Positive: Abdominal distention Negative: Vomiting  Physical Exam  BP 116/78 (BP Location: Right Arm)   Pulse 99   Temp 98.2 F (36.8 C) (Oral)   Resp 14   SpO2 94%  Gen:   Awake, no distress   Resp:  Normal effort  MSK:   Moves extremities without difficulty  Other:  Tight, tender abdominal distention, left upper quadrant tenderness with involuntary guarding  Medical Decision Making  Medically screening exam initiated at 12:29 PM.  Appropriate orders placed.  Wesley [name] was informed that the remainder of the evaluation will be completed by another provider, this initial triage assessment does not replace that evaluation, and the importance of remaining in the ED until their evaluation is complete.  Work-up initiated   Wesley Mail, PA-C 08/27/22 1230

## 2022-08-27 NOTE — ED Notes (Signed)
Provider at bedside
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
## 2022-08-27 NOTE — ED Notes (Signed)
Pt called several times for triage, no answer.

## 2022-08-27 NOTE — ED Notes (Signed)
Pt moved to room 47 and verbal report given to [place] at this time

## 2022-08-27 NOTE — ED Notes (Addendum)
Disregard this note.

## 2022-08-27 NOTE — ED Notes (Signed)
Received verbal report from [place] at this time

## 2022-08-27 NOTE — ED Notes (Signed)
Pt in hallway resting. NAD Updated on plan of care.

## 2022-08-28 ENCOUNTER — Observation Stay (HOSPITAL_COMMUNITY): Payer: Medicare Other

## 2022-08-28 ENCOUNTER — Telehealth: Payer: Self-pay | Admitting: Emergency Medicine

## 2022-08-28 DIAGNOSIS — K635 Polyp of colon: Secondary | ICD-10-CM | POA: Diagnosis present

## 2022-08-28 DIAGNOSIS — E871 Hypo-osmolality and hyponatremia: Secondary | ICD-10-CM | POA: Diagnosis present

## 2022-08-28 DIAGNOSIS — N179 Acute kidney failure, unspecified: Secondary | ICD-10-CM | POA: Diagnosis present

## 2022-08-28 DIAGNOSIS — R911 Solitary pulmonary nodule: Secondary | ICD-10-CM | POA: Diagnosis not present

## 2022-08-28 DIAGNOSIS — I2510 Atherosclerotic heart disease of native coronary artery without angina pectoris: Secondary | ICD-10-CM | POA: Diagnosis present

## 2022-08-28 DIAGNOSIS — K859 Acute pancreatitis without necrosis or infection, unspecified: Secondary | ICD-10-CM

## 2022-08-28 DIAGNOSIS — J439 Emphysema, unspecified: Secondary | ICD-10-CM | POA: Diagnosis present

## 2022-08-28 DIAGNOSIS — D75839 Thrombocytosis, unspecified: Secondary | ICD-10-CM | POA: Diagnosis present

## 2022-08-28 DIAGNOSIS — B182 Chronic viral hepatitis C: Secondary | ICD-10-CM | POA: Diagnosis present

## 2022-08-28 DIAGNOSIS — E872 Acidosis, unspecified: Secondary | ICD-10-CM | POA: Diagnosis present

## 2022-08-28 DIAGNOSIS — I700 Atherosclerosis of aorta: Secondary | ICD-10-CM | POA: Diagnosis not present

## 2022-08-28 DIAGNOSIS — J90 Pleural effusion, not elsewhere classified: Secondary | ICD-10-CM | POA: Diagnosis not present

## 2022-08-28 DIAGNOSIS — F17210 Nicotine dependence, cigarettes, uncomplicated: Secondary | ICD-10-CM | POA: Diagnosis present

## 2022-08-28 DIAGNOSIS — K8689 Other specified diseases of pancreas: Secondary | ICD-10-CM

## 2022-08-28 DIAGNOSIS — G629 Polyneuropathy, unspecified: Secondary | ICD-10-CM | POA: Diagnosis present

## 2022-08-28 DIAGNOSIS — B3781 Candidal esophagitis: Secondary | ICD-10-CM | POA: Diagnosis present

## 2022-08-28 DIAGNOSIS — K259 Gastric ulcer, unspecified as acute or chronic, without hemorrhage or perforation: Secondary | ICD-10-CM | POA: Diagnosis present

## 2022-08-28 DIAGNOSIS — K319 Disease of stomach and duodenum, unspecified: Secondary | ICD-10-CM | POA: Diagnosis present

## 2022-08-28 DIAGNOSIS — I82891 Chronic embolism and thrombosis of other specified veins: Secondary | ICD-10-CM | POA: Diagnosis present

## 2022-08-28 DIAGNOSIS — J4489 Other specified chronic obstructive pulmonary disease: Secondary | ICD-10-CM | POA: Diagnosis present

## 2022-08-28 DIAGNOSIS — D735 Infarction of spleen: Secondary | ICD-10-CM

## 2022-08-28 DIAGNOSIS — I739 Peripheral vascular disease, unspecified: Secondary | ICD-10-CM | POA: Diagnosis present

## 2022-08-28 DIAGNOSIS — F1011 Alcohol abuse, in remission: Secondary | ICD-10-CM | POA: Diagnosis present

## 2022-08-28 DIAGNOSIS — G8929 Other chronic pain: Secondary | ICD-10-CM | POA: Diagnosis present

## 2022-08-28 DIAGNOSIS — K861 Other chronic pancreatitis: Secondary | ICD-10-CM

## 2022-08-28 DIAGNOSIS — K863 Pseudocyst of pancreas: Secondary | ICD-10-CM | POA: Diagnosis present

## 2022-08-28 DIAGNOSIS — Z86718 Personal history of other venous thrombosis and embolism: Secondary | ICD-10-CM | POA: Diagnosis not present

## 2022-08-28 DIAGNOSIS — G621 Alcoholic polyneuropathy: Secondary | ICD-10-CM | POA: Diagnosis present

## 2022-08-28 DIAGNOSIS — K5900 Constipation, unspecified: Secondary | ICD-10-CM | POA: Diagnosis present

## 2022-08-28 LAB — CBC WITH DIFFERENTIAL/PLATELET
Abs Immature Granulocytes: 0.03 10*3/uL (ref 0.00–0.07)
Basophils Absolute: 0.1 10*3/uL (ref 0.0–0.1)
Basophils Relative: 1 %
Eosinophils Absolute: 0.4 10*3/uL (ref 0.0–0.5)
Eosinophils Relative: 4 %
HCT: 42.1 % (ref 39.0–52.0)
Hemoglobin: 14.2 g/dL (ref 13.0–17.0)
Immature Granulocytes: 0 %
Lymphocytes Relative: 25 %
Lymphs Abs: 2.6 10*3/uL (ref 0.7–4.0)
MCH: 30.3 pg (ref 26.0–34.0)
MCHC: 33.7 g/dL (ref 30.0–36.0)
MCV: 90 fL (ref 80.0–100.0)
Monocytes Absolute: 1.1 10*3/uL — ABNORMAL HIGH (ref 0.1–1.0)
Monocytes Relative: 10 %
Neutro Abs: 6 10*3/uL (ref 1.7–7.7)
Neutrophils Relative %: 60 %
Platelets: 610 10*3/uL — ABNORMAL HIGH (ref 150–400)
RBC: 4.68 MIL/uL (ref 4.22–5.81)
RDW: 17.2 % — ABNORMAL HIGH (ref 11.5–15.5)
WBC: 10.2 10*3/uL (ref 4.0–10.5)
nRBC: 0 % (ref 0.0–0.2)

## 2022-08-28 LAB — COMPREHENSIVE METABOLIC PANEL
ALT: 11 U/L (ref 0–44)
AST: 16 U/L (ref 15–41)
Albumin: 2.6 g/dL — ABNORMAL LOW (ref 3.5–5.0)
Alkaline Phosphatase: 73 U/L (ref 38–126)
Anion gap: 14 (ref 5–15)
BUN: <5 mg/dL — ABNORMAL LOW (ref 6–20)
CO2: 25 mmol/L (ref 22–32)
Calcium: 9.7 mg/dL (ref 8.9–10.3)
Chloride: 96 mmol/L — ABNORMAL LOW (ref 98–111)
Creatinine, Ser: 1.09 mg/dL (ref 0.61–1.24)
GFR, Estimated: >60 mL/min (ref >60)
Glucose, Bld: 93 mg/dL (ref 70–99)
Potassium: 3.6 mmol/L (ref 3.5–5.1)
Sodium: 135 mmol/L (ref 135–145)
Total Bilirubin: 0.7 mg/dL (ref 0.3–1.2)
Total Protein: 7.5 g/dL (ref 6.5–8.1)

## 2022-08-28 LAB — LACTIC ACID, PLASMA: Lactic Acid, Venous: 0.6 mmol/L (ref 0.5–1.9)

## 2022-08-28 LAB — PROTIME-INR
INR: 1.1 (ref 0.8–1.2)
Prothrombin Time: 13.7 seconds (ref 11.4–15.2)

## 2022-08-28 LAB — MAGNESIUM: Magnesium: 2 mg/dL (ref 1.7–2.4)

## 2022-08-28 LAB — PHOSPHORUS: Phosphorus: 3 mg/dL (ref 2.5–4.6)

## 2022-08-28 MED ORDER — NICOTINE 21 MG/24HR TD PT24
21.0000 mg | MEDICATED_PATCH | Freq: Every day | TRANSDERMAL | Status: DC
  Administered 2022-08-28 – 2022-09-03 (×7): 21 mg via TRANSDERMAL
  Filled 2022-08-28 (×6): qty 1

## 2022-08-28 MED ORDER — IOHEXOL 350 MG/ML SOLN
60.0000 mL | Freq: Once | INTRAVENOUS | Status: AC | PRN
  Administered 2022-08-28: 60 mL via INTRAVENOUS

## 2022-08-28 MED ORDER — LACTATED RINGERS IV SOLN: INTRAVENOUS | Status: AC
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
## 2022-08-28 NOTE — Progress Notes (Signed)
Wesley [name] is a 56 y.o. male patient admitted. Awake, alert - oriented  X 4 - no acute distress noted.  VSS - Blood pressure (!) 132/90, pulse 95, temperature 98.7 F (37.1 C), temperature source Oral, resp. rate 18, height 5' 8" (1.727 m), weight 74.4 kg, SpO2 96 %.    IV in place, occlusive dsg intact without redness.  Orientation to room, and floor completed.  Admission INP armband ID verified with patient/family, and in place.   SR up x 2, fall assessment complete, with patient and family able to verbalize understanding of risk associated with falls, and verbalized understanding to call nsg before up out of bed.  Call light within reach, patient able to voice, and demonstrate understanding. No evidence of skin break down noted on exam.       Will cont to eval and treat per MD orders.  Hezzie Bump, RN 08/28/2022 3:44 PM
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
## 2022-08-28 NOTE — Telephone Encounter (Signed)
Need to set this pt up for an outpt PET and hosp f/u OV to eval RUL nodule. May be headed for surgical resection vs XRT.

## 2022-08-28 NOTE — ED Notes (Signed)
Patient back from CT scan at this time and requesting pain medicine. This RN to administer ordered PRN pain medication for patient.
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
## 2022-08-29 DIAGNOSIS — K859 Acute pancreatitis without necrosis or infection, unspecified: Secondary | ICD-10-CM | POA: Diagnosis not present

## 2022-08-29 LAB — CBC WITH DIFFERENTIAL/PLATELET
Abs Immature Granulocytes: 0.02 10*3/uL (ref 0.00–0.07)
Basophils Absolute: 0.2 10*3/uL — ABNORMAL HIGH (ref 0.0–0.1)
Basophils Relative: 2 %
Eosinophils Absolute: 0.4 10*3/uL (ref 0.0–0.5)
Eosinophils Relative: 4 %
HCT: 40.7 % (ref 39.0–52.0)
Hemoglobin: 14.1 g/dL (ref 13.0–17.0)
Immature Granulocytes: 0 %
Lymphocytes Relative: 33 %
Lymphs Abs: 3 10*3/uL (ref 0.7–4.0)
MCH: 30.9 pg (ref 26.0–34.0)
MCHC: 34.6 g/dL (ref 30.0–36.0)
MCV: 89.1 fL (ref 80.0–100.0)
Monocytes Absolute: 1 10*3/uL (ref 0.1–1.0)
Monocytes Relative: 11 %
Neutro Abs: 4.5 10*3/uL (ref 1.7–7.7)
Neutrophils Relative %: 50 %
Platelets: 521 10*3/uL — ABNORMAL HIGH (ref 150–400)
RBC: 4.57 MIL/uL (ref 4.22–5.81)
RDW: 16.9 % — ABNORMAL HIGH (ref 11.5–15.5)
WBC: 9 10*3/uL (ref 4.0–10.5)
nRBC: 0 % (ref 0.0–0.2)

## 2022-08-29 LAB — COMPREHENSIVE METABOLIC PANEL
ALT: 11 U/L (ref 0–44)
AST: 13 U/L — ABNORMAL LOW (ref 15–41)
Albumin: 2.4 g/dL — ABNORMAL LOW (ref 3.5–5.0)
Alkaline Phosphatase: 67 U/L (ref 38–126)
Anion gap: 14 (ref 5–15)
BUN: 5 mg/dL — ABNORMAL LOW (ref 6–20)
CO2: 23 mmol/L (ref 22–32)
Calcium: 9 mg/dL (ref 8.9–10.3)
Chloride: 98 mmol/L (ref 98–111)
Creatinine, Ser: 1.08 mg/dL (ref 0.61–1.24)
GFR, Estimated: >60 mL/min (ref >60)
Glucose, Bld: 71 mg/dL (ref 70–99)
Potassium: 4.1 mmol/L (ref 3.5–5.1)
Sodium: 135 mmol/L (ref 135–145)
Total Bilirubin: 0.7 mg/dL (ref 0.3–1.2)
Total Protein: 7.3 g/dL (ref 6.5–8.1)

## 2022-08-29 MED ORDER — OXYCODONE HCL 5 MG PO TABS
10.0000 mg | ORAL_TABLET | ORAL | Status: DC | PRN
  Administered 2022-08-29 – 2022-09-03 (×13): 10 mg via ORAL
  Filled 2022-08-29 (×13): qty 2

## 2022-08-29 MED ORDER — HYDROMORPHONE HCL 1 MG/ML IJ SOLN
1.0000 mg | Freq: Four times a day (QID) | INTRAMUSCULAR | Status: DC | PRN
  Administered 2022-08-29 – 2022-09-02 (×11): 1 mg via INTRAVENOUS
  Filled 2022-08-29 (×13): qty 1

## 2022-08-29 NOTE — Telephone Encounter (Signed)
Hi Amanda - I need to set this pt up with a nodule visit / hosp f/u with me. I am leaving this message in my box because I also need to order an outpt PET after he gets discharged.

## 2022-08-29 NOTE — Hospital Course (Signed)
Pt states he is feeling sore, states it's the same pain as he had before upon admission. He is able to pass gas, has been having bowel movements with diarrhea. Last team he ate was Monday (the half sandwich). Will start with liquids diet today. Discussed pseudocysts with patients. Discussed lung findings. S  Pain is under rib cage, likely splenic. No trouble breathing. Increased oxycodone to 32m, and dilaudid to 1 for pain management.   Will monitor patient status on clear liquid diet and resort to cortrak if necessary  10/1  Pt states he threw up twice last night, thinks it was nauseas, and was also in pain last night. Says IV zofran helped more than pill form. States pain is the same as when he came in, it has gotten better than got worse , could be due to PO intake. Pt also complaining of size of creon pill, recommended using apple sauce to help. Will give more fluids. Pt stopped drinking years ago, and was initially ddxed with alcohol induced pancreatitis.   Abdomen tender in epigastric region and radiates to L side.   10/2  Pt still seems uncomfortable, and denies having appetite. Does not like taking creon. Will talk to the nurse about clearing up creon orders. Abdominal pain is about the same. Uses oxy and dilaudid. Pt used the bathroom yesterday.  Hard to swallow and things feel like they get stuck. Everything feels like it gets stuck. Regular swallowing like swallowing saliva gets stuck. Vomiting because feels like things get stuck in throat. Has been going on for a little bit before he got here. Dr. CBryan Lemma?? GI doc. Still endorsing anusea. Will get speech to see him   10/3 Pt is complaining of abdominal pain with exertion of abdomen. Has been able to tolerate PO clear liquid diet. Denies any vomiting. Explained etiology of pain due to acute on chronic pancreatitis. Denies alcohol use. Explained barium swallow study. Pt is passing gas, not having bowel movements.

## 2022-08-29 NOTE — Progress Notes (Signed)
IR was requested for image guided aspiration/drain placement for perisplenic fluid collection.   Case was reviewed by Dr. Kathlene Cote yesterday, it appears that patient is developing pseudocysts which often not amenable for percutaneous drainage due to thick nature of the fluid, and it is sterile most of time and does not need percutaneous drainage.    Dr. Kathlene Cote discussed with primary team yesterday, no drain placement is indicated currently.    Will delete the IR Rad eval order.  Please call IR for questions and concerns.   Armando Gang Chelesa Weingartner PA-C 08/29/2022 8:55 AM

## 2022-08-29 NOTE — Progress Notes (Addendum)
HD#1 Subjective:   Summary:  Wesley [name] is a 56 y.o. male with hx of chronic pancreatitis, chronic HCV, COPD, PAD, alcoholic peripheral neuropathy, chronic thrombosis of splenic vein presenting with persistent left sided abdominal pain.  Overnight Events: NAEO.  Pt states he is feeling sore, states it's the same pain as he had yesterday. Pain is under rib cage on left. Denies trouble breathing. He is able to pass gas, has been having bowel movements with diarrhea. States he last ate on Monday (half a sandwich). Discussed liquid diet today and potential for NG tube if unable to advance diet. Discussed lung findings and he had been made aware by PCCM yesterday.   Objective:  Vital signs in last 24 hours: Vitals:   08/28/22 1516 08/28/22 2156 08/29/22 0528 08/29/22 1122  BP: (!) 132/90 122/80 133/85 130/87  Pulse: 95 87 90 87  Resp: _0 Temp: 98.7 F (37.1 C) 97.6 F (36.4 C) 97.7 F (36.5 C) 98 F (36.7 C)  TempSrc: Oral Oral Oral Oral  SpO2: 96% 93% 92% 97%  Weight:      Height:       Supplemental O2: Room Air SpO2: 97 %   Physical Exam:  Constitutional: ill-appearing male sitting in hospital bed, in no acute distress, non-diaphoretic HENT: normocephalic atraumatic, mucous membranes moist Eyes: conjunctiva non-erythematous Neck: supple Cardiovascular: regular rate and rhythm, no m/r/g Pulmonary/Chest: normal work of breathing on room air Abdominal: Distended, tenderness in the left upper and lower quadrants MSK: normal bulk and tone Neurological: alert & oriented x 3 Skin: warm and dry Psych: Normal mood and affect  Filed Weights   08/27/22 1559  Weight: 74.4 kg     Intake/Output Summary (Last 24 hours) at 08/29/2022 1130 Last data filed at 08/29/2022 0900 Gross per 24 hour  Intake 545.94 ml  Output --  Net 545.94 ml   Net IO Since Admission: 545.94 mL [08/29/22 1130]  Pertinent Labs:    Latest Ref Rng & Units 08/29/2022    2:28 AM 08/28/2022     4:28 AM 08/27/2022   12:36 PM  CBC  WBC 4.0 - 10.5 K/uL 9.0  10.2  12.4   Hemoglobin 13.0 - 17.0 g/dL 14.1  14.2  15.3   Hematocrit 39.0 - 52.0 % 40.7  42.1  43.0   Platelets 150 - 400 K/uL 521  610  668        Latest Ref Rng & Units 08/29/2022    2:28 AM 08/28/2022    4:28 AM 08/27/2022   12:36 PM  CMP  Glucose 70 - 99 mg/dL 71  93  105   BUN 6 - 20 mg/dL 5  <5  <5   Creatinine 0.61 - 1.24 mg/dL 1.08  1.09  1.13   Sodium 135 - 145 mmol/L 135  135  133   Potassium 3.5 - 5.1 mmol/L 4.1  3.6  3.3   Chloride 98 - 111 mmol/L 98  96  98   CO2 22 - 32 mmol/L _1 Calcium 8.9 - 10.3 mg/dL 9.0  9.7  9.2   Total Protein 6.5 - 8.1 g/dL 7.3  7.5  8.2   Total Bilirubin 0.3 - 1.2 mg/dL 0.7  0.7  0.6   Alkaline Phos 38 - 126 U/L 67  73  79   AST 15 - 41 U/L _2 ALT 0 - 44 U/L 11  11  11     Imaging: No results found.  Assessment/Plan:   Principal Problem:   Acute on chronic pancreatitis (HCC) Active Problems:   COPD with chronic bronchitis (HCC)   Incidental lung nodule, greater than or equal to 77m   Pancreatic pseudocyst   Patient Summary: Wesley [name] is a 56y.o. male with hx of chronic pancreatitis, chronic HCV, COPD, PAD, alcoholic peripheral neuropathy, chronic thrombosis of splenic vein presenting with persistent left sided abdominal pain likely secondary to progressive subscapular splenic fluid collections in the setting of acute on chronic pancreatitis with pseudocysts.   #Progressive Subcapsular Splenic Fluid Collections # Acute on Chronic pancreatitis with pseudocysts Persistent RUQ/LQ pain, vomiting and nausea in the setting of chronic pancreatitis. Likely acute now with elevated lipase and characteristic symptoms. Has not eaten for a few days. CT 08/27/22 with similar fullness and low-density involving the distal pancreatic body and tail region. Multiple pseudocysts noted. No indication for drainage unless concerned for infecition.  WBC wnl, Hgb 14.1.  Afebrile, VSS.  Will ADAT, if unable to tolerate PO by this afternoon, will order Cortrak. -trial liquid diet, ADAT -blood cx NG x 12 hrs -Pain control with Tylenol 650 mg every 6 as needed, oxycodone 10 mg every 4 hours as needed and Dilaudid 1 mg IV every 6 hours as needed -LR 100 mL/h -Continue [name] with meals -Zofran for nausea -Outpatient GI follow-up  #Incidental lung nodule concerning for primary lung malignancy, posterior right apex 9/28 CT chest with interval enlargement of a nodule associated with an emphysematous bulla in the posterior right apex, 1.2 x 1.2 cm. Seen by PCCM who will follow outpatient. Due to location, may require surgical resection vs XRT. -outpatient PET and office visit to evaluate RUL nodule   #Constipation -daily Miralax    #COPD Continue albuterol and breo ellipta.   #BPH Continue Flomax.  Diet: NPO IVF: LR,100cc/hr VTE: Heparin Code: Full PT/OT recs: Pending, none.   Dispo: Admit patient to Observation with expected length of stay less than 2 midnights.  JLinward NatalMD Internal Medicine Resident PGY-1 Please contact the on call pager after 5 pm and on weekends at [phone]

## 2022-08-30 DIAGNOSIS — K859 Acute pancreatitis without necrosis or infection, unspecified: Secondary | ICD-10-CM | POA: Diagnosis not present

## 2022-08-30 LAB — CBC WITH DIFFERENTIAL/PLATELET
Abs Immature Granulocytes: 0.03 10*3/uL (ref 0.00–0.07)
Basophils Absolute: 0.1 10*3/uL (ref 0.0–0.1)
Basophils Relative: 1 %
Eosinophils Absolute: 0.4 10*3/uL (ref 0.0–0.5)
Eosinophils Relative: 4 %
HCT: 42.7 % (ref 39.0–52.0)
Hemoglobin: 14.6 g/dL (ref 13.0–17.0)
Immature Granulocytes: 0 %
Lymphocytes Relative: 21 %
Lymphs Abs: 2.2 10*3/uL (ref 0.7–4.0)
MCH: 30.9 pg (ref 26.0–34.0)
MCHC: 34.2 g/dL (ref 30.0–36.0)
MCV: 90.3 fL (ref 80.0–100.0)
Monocytes Absolute: 1.1 10*3/uL — ABNORMAL HIGH (ref 0.1–1.0)
Monocytes Relative: 11 %
Neutro Abs: 6.4 10*3/uL (ref 1.7–7.7)
Neutrophils Relative %: 63 %
Platelets: 508 10*3/uL — ABNORMAL HIGH (ref 150–400)
RBC: 4.73 MIL/uL (ref 4.22–5.81)
RDW: 16.8 % — ABNORMAL HIGH (ref 11.5–15.5)
WBC: 10.3 10*3/uL (ref 4.0–10.5)
nRBC: 0 % (ref 0.0–0.2)

## 2022-08-30 LAB — COMPREHENSIVE METABOLIC PANEL
ALT: 10 U/L (ref 0–44)
AST: 14 U/L — ABNORMAL LOW (ref 15–41)
Albumin: 2.6 g/dL — ABNORMAL LOW (ref 3.5–5.0)
Alkaline Phosphatase: 62 U/L (ref 38–126)
Anion gap: 14 (ref 5–15)
BUN: 5 mg/dL — ABNORMAL LOW (ref 6–20)
CO2: 21 mmol/L — ABNORMAL LOW (ref 22–32)
Calcium: 9.2 mg/dL (ref 8.9–10.3)
Chloride: 101 mmol/L (ref 98–111)
Creatinine, Ser: 1.07 mg/dL (ref 0.61–1.24)
GFR, Estimated: >60 mL/min (ref >60)
Glucose, Bld: 81 mg/dL (ref 70–99)
Potassium: 4.5 mmol/L (ref 3.5–5.1)
Sodium: 136 mmol/L (ref 135–145)
Total Bilirubin: 0.8 mg/dL (ref 0.3–1.2)
Total Protein: 7.9 g/dL (ref 6.5–8.1)

## 2022-08-30 MED ORDER — ONDANSETRON HCL 4 MG PO TABS
4.0000 mg | ORAL_TABLET | Freq: Three times a day (TID) | ORAL | Status: DC | PRN
  Administered 2022-08-31: 4 mg via ORAL
  Filled 2022-08-30 (×3): qty 1

## 2022-08-30 MED ORDER — ONDANSETRON HCL 4 MG/2ML IJ SOLN
4.0000 mg | Freq: Three times a day (TID) | INTRAMUSCULAR | Status: DC | PRN
  Administered 2022-08-30 – 2022-09-02 (×7): 4 mg via INTRAVENOUS
  Filled 2022-08-30 (×7): qty 2

## 2022-08-30 NOTE — Progress Notes (Addendum)
HD#2 Subjective:   Summary:  Wesley [name] is a 56 y.o. male with hx of chronic pancreatitis, chronic HCV, COPD, PAD, alcoholic peripheral neuropathy, chronic thrombosis of splenic vein presenting with persistent left sided abdominal pain.  Overnight Events: NAEO.  Patient states his pain is improved to 5 out of 10 today.  Continues to have left subphrenic pain.  He was able to eat a little bit but said he had to eat slowly and some of his food was less appetizing as had been sitting there for a while.  He says his nausea is what precludes him from eating but I reminded him of Zofran available to him which she said helps.  Noted some right abdominal pain this morning but this has gone away.  Denies vomiting.   Objective:  Vital signs in last 24 hours: Vitals:   08/29/22 1630 08/29/22 2202 08/30/22 0445 08/30/22 0747  BP: 126/80 (!) 130/91 129/87 128/86  Pulse: 90 94 83 98  Resp: _0 Temp: 98.5 F (36.9 C) 97.8 F (36.6 C) 97.6 F (36.4 C) 98.3 F (36.8 C)  TempSrc: Oral Oral Oral Oral  SpO2: 97% 97% 97% 95%  Weight:      Height:       Supplemental O2: Room Air SpO2: 95 %   Physical Exam:  Constitutional: well-appearing male sitting in hospital bed, in no acute distress HENT: normocephalic atraumatic, mucous membranes moist Eyes: conjunctiva non-erythematous Neck: supple Cardiovascular: regular rate and rhythm, no m/r/g Pulmonary/Chest: normal work of breathing on room air, lungs CTAB Abdominal: Distended, tenderness in the left upper and lower quadrants MSK: normal bulk and tone Neurological: alert & oriented x 3 Skin: warm and dry Psych: Normal mood and affect  Filed Weights   08/27/22 1559  Weight: 74.4 kg     Intake/Output Summary (Last 24 hours) at 08/30/2022 1227 Last data filed at 08/29/2022 1630 Gross per 24 hour  Intake 240 ml  Output --  Net 240 ml   Net IO Since Admission: 785.94 mL [08/30/22 1227]  Pertinent Labs:    Latest Ref Rng &  Units 08/30/2022    2:42 AM 08/29/2022    2:28 AM 08/28/2022    4:28 AM  CBC  WBC 4.0 - 10.5 K/uL 10.3  9.0  10.2   Hemoglobin 13.0 - 17.0 g/dL 14.6  14.1  14.2   Hematocrit 39.0 - 52.0 % 42.7  40.7  42.1   Platelets 150 - 400 K/uL 508  521  610        Latest Ref Rng & Units 08/30/2022    2:42 AM 08/29/2022    2:28 AM 08/28/2022    4:28 AM  CMP  Glucose 70 - 99 mg/dL 81  71  93   BUN 6 - 20 mg/dL 5  5  <5   Creatinine 0.61 - 1.24 mg/dL 1.07  1.08  1.09   Sodium 135 - 145 mmol/L 136  135  135   Potassium 3.5 - 5.1 mmol/L 4.5  4.1  3.6   Chloride 98 - 111 mmol/L 101  98  96   CO2 22 - 32 mmol/L _1 Calcium 8.9 - 10.3 mg/dL 9.2  9.0  9.7   Total Protein 6.5 - 8.1 g/dL 7.9  7.3  7.5   Total Bilirubin 0.3 - 1.2 mg/dL 0.8  0.7  0.7   Alkaline Phos 38 - 126 U/L 62  67  73  AST 15 - 41 U/L _0 ALT 0 - 44 U/L _1 Imaging: No results found.  Assessment/Plan:   Principal Problem:   Acute on chronic pancreatitis (HCC) Active Problems:   COPD with chronic bronchitis (HCC)   Incidental lung nodule, greater than or equal to 45m   Pancreatic pseudocyst   Patient Summary: Wesley [name] is a 56y.o. male with hx of chronic pancreatitis, chronic HCV, COPD, PAD, alcoholic peripheral neuropathy, chronic thrombosis of splenic vein presenting with persistent left sided abdominal pain likely secondary to progressive subscapular splenic fluid collections in the setting of acute on chronic pancreatitis with pseudocysts.   #Progressive Subcapsular Splenic Fluid Collections # Acute on Chronic pancreatitis with pseudocysts CT 08/27/22 with similar fullness and low-density involving the distal pancreatic body and tail region. Multiple pseudocysts noted.  Vital signs stable. Still without leukocytosis.  Hemoglobin wnl.  Patient's abdominal pain improved.  Continues to endorse nausea but this improves with Zofran.  Denies vomiting.  Was able to eat a little bit yesterday and  is willing to try again today.  Abdomen similar on exam. Will continue to treat symptoms of acute pancreatitis with IVF, pain control, and slowly advance diet. -ADAT -Pain control with Tylenol 650 mg every 6 as needed, oxycodone 10 mg every 4 hours as needed and Dilaudid 1 mg IV every 6 hours as needed -LR 100 mL/h -Continue [name] with meals -Zofran for nausea -Outpatient GI follow-up  #Incidental lung nodule concerning for primary lung malignancy, posterior right apex 9/28 CT chest with interval enlargement of a nodule associated with an emphysematous bulla in the posterior right apex, 1.2 x 1.2 cm. Seen by PCCM who will follow outpatient. Due to location, may require surgical resection vs XRT. -outpatient PET and office visit to evaluate RUL nodule   #Constipation -daily Miralax    #COPD Continue albuterol and breo ellipta.   #BPH Continue Flomax.  Diet: Liquids, ADAT IVF: LR,100cc/hr VTE: Heparin Code: Full PT/OT recs: Pending, none.   Dispo: Admit patient to Observation with expected length of stay less than 2 midnights.  JLinward NatalMD Internal Medicine Resident PGY-1 Please contact the on call pager after 5 pm and on weekends at [phone]

## 2022-08-30 NOTE — Progress Notes (Signed)
Patient has experienced 2 episodes of nausea, which has lead to dry heaving and abdominal pain. Zofran administered this morning with some relief. MD White paged.

## 2022-08-30 NOTE — Progress Notes (Signed)
Internal Medicine Attending:   I have seen and evaluated this patient and I have discussed the plan of care with the house staff. I reviewed the resident's note 08/30/22 and I agree with the resident's findings and plan as documented in the resident's note. Please see their note for complete details.   Charise Killian, MD 08/30/2022, 1:55 PM

## 2022-08-31 LAB — COMPREHENSIVE METABOLIC PANEL
ALT: 9 U/L (ref 0–44)
AST: 16 U/L (ref 15–41)
Albumin: 3.1 g/dL — ABNORMAL LOW (ref 3.5–5.0)
Alkaline Phosphatase: 75 U/L (ref 38–126)
Anion gap: 19 — ABNORMAL HIGH (ref 5–15)
BUN: 6 mg/dL (ref 6–20)
CO2: 16 mmol/L — ABNORMAL LOW (ref 22–32)
Calcium: 10.4 mg/dL — ABNORMAL HIGH (ref 8.9–10.3)
Chloride: 97 mmol/L — ABNORMAL LOW (ref 98–111)
Creatinine, Ser: 1.56 mg/dL — ABNORMAL HIGH (ref 0.61–1.24)
GFR, Estimated: 51 mL/min — ABNORMAL LOW (ref >60)
Glucose, Bld: 121 mg/dL — ABNORMAL HIGH (ref 70–99)
Potassium: 4.1 mmol/L (ref 3.5–5.1)
Sodium: 132 mmol/L — ABNORMAL LOW (ref 135–145)
Total Bilirubin: 0.8 mg/dL (ref 0.3–1.2)
Total Protein: 8.6 g/dL — ABNORMAL HIGH (ref 6.5–8.1)

## 2022-08-31 LAB — CBC
HCT: 46.4 % (ref 39.0–52.0)
Hemoglobin: 16.2 g/dL (ref 13.0–17.0)
MCH: 31.1 pg (ref 26.0–34.0)
MCHC: 34.9 g/dL (ref 30.0–36.0)
MCV: 89.1 fL (ref 80.0–100.0)
Platelets: 539 10*3/uL — ABNORMAL HIGH (ref 150–400)
RBC: 5.21 MIL/uL (ref 4.22–5.81)
RDW: 16.8 % — ABNORMAL HIGH (ref 11.5–15.5)
WBC: 13.9 10*3/uL — ABNORMAL HIGH (ref 4.0–10.5)
nRBC: 0 % (ref 0.0–0.2)

## 2022-08-31 LAB — BETA-HYDROXYBUTYRIC ACID: Beta-Hydroxybutyric Acid: 3.63 mmol/L — ABNORMAL HIGH (ref 0.05–0.27)

## 2022-08-31 MED ORDER — LACTATED RINGERS IV BOLUS
1000.0000 mL | Freq: Once | INTRAVENOUS | Status: AC
  Administered 2022-08-31: 1000 mL via INTRAVENOUS

## 2022-08-31 NOTE — Progress Notes (Signed)
HD#3 Subjective:   Summary:  Wesley [name] is a 56 y.o. male with hx of chronic pancreatitis, chronic HCV, COPD, PAD, alcoholic peripheral neuropathy, chronic thrombosis of splenic vein presenting with persistent left sided abdominal pain.  Overnight Events: Patient ate some mashed potatoes and gravy and had 2 episodes of emesis.  He also states that his abdominal pain became more diffuse.  This a.m. he states that his abdominal pain continues to be worsened. He notes that it did improve briefly. Over the last couple of days. He has used his oxy x3 and HM x3 in the last 24 hours. He also notes is mouth is dry.    Vitals in last 24 hours: Vitals:   08/30/22 1620 08/30/22 2015 08/31/22 0533 08/31/22 0844  BP: 129/87 (!) 142/87 127/77 121/85  Pulse: 92 (!) 108 (!) 109 (!) 102  Resp: _0 Temp: 98.7 F (37.1 C) 98.8 F (37.1 C) 99.1 F (37.3 C) 99 F (37.2 C)  TempSrc: Oral Oral Oral Oral  SpO2: 96% 94% 95% 94%  Weight:      Height:       Supplemental O2: Room Air SpO2: 94 %   Physical Exam:  Constitutional: well-appearing male sitting in hospital bed, in no acute distress HENT: normocephalic atraumatic, dry mucous membranes  Eyes: conjunctiva non-erythematous Neck: supple Cardiovascular: regular rate and rhythm, no m/r/g Pulmonary/Chest: normal work of breathing on room air, lungs CTAB Abdominal: nondistended, mild TTP diffusely  MSK: normal bulk and tone Neurological: alert & oriented x 3 Skin: warm and dry Psych: Normal mood and affect  Filed Weights   08/27/22 1559  Weight: 74.4 kg     Intake/Output Summary (Last 24 hours) at 08/31/2022 1352 Last data filed at 08/30/2022 2330 Gross per 24 hour  Intake 240 ml  Output --  Net 240 ml    Net IO Since Admission: 1,025.94 mL [08/31/22 1352]  Pertinent Labs:    Latest Ref Rng & Units 08/31/2022    4:51 AM 08/30/2022    2:42 AM 08/29/2022    2:28 AM  CBC  WBC 4.0 - 10.5 K/uL 13.9  10.3  9.0    Hemoglobin 13.0 - 17.0 g/dL 16.2  14.6  14.1   Hematocrit 39.0 - 52.0 % 46.4  42.7  40.7   Platelets 150 - 400 K/uL 539  508  521        Latest Ref Rng & Units 08/31/2022    4:51 AM 08/30/2022    2:42 AM 08/29/2022    2:28 AM  CMP  Glucose 70 - 99 mg/dL 121  81  71   BUN 6 - 20 mg/dL _1 Creatinine 0.61 - 1.24 mg/dL 1.56  1.07  1.08   Sodium 135 - 145 mmol/L 132  136  135   Potassium 3.5 - 5.1 mmol/L 4.1  4.5  4.1   Chloride 98 - 111 mmol/L 97  101  98   CO2 22 - 32 mmol/L _2 Calcium 8.9 - 10.3 mg/dL 10.4  9.2  9.0   Total Protein 6.5 - 8.1 g/dL 8.6  7.9  7.3   Total Bilirubin 0.3 - 1.2 mg/dL 0.8  0.8  0.7   Alkaline Phos 38 - 126 U/L 75  62  67   AST 15 - 41 U/L _3 ALT 0 - 44 U/L 9  10  11     Imaging: No results found.  Assessment/Plan:   Principal Problem:   Acute on chronic pancreatitis (HCC) Active Problems:   COPD with chronic bronchitis (HCC)   Incidental lung nodule, greater than or equal to 37m   Pancreatic pseudocyst   Patient Summary: Wesley [name] is a 56y.o. male with hx of chronic pancreatitis, chronic HCV, COPD, PAD, alcoholic peripheral neuropathy, chronic thrombosis of splenic vein presenting with persistent left sided abdominal pain likely secondary to progressive subscapular splenic fluid collections in the setting of acute on chronic pancreatitis with pseudocysts.   #Progressive Subcapsular Splenic Fluid Collections # Acute on Chronic pancreatitis with pseudocysts CT 08/27/22 with similar fullness and low-density involving the distal pancreatic body and tail region. Multiple pseudocysts noted.  Patient now with tachycardia and leukocytosis to almost 14,000.  However the rest of his cell lines also increased, suggestive of hemoconcentration in the setting of poor oral intake.  Patient also notes that his abdominal pain has worsened acutely after eating mashed potatoes and gravy having 2 episodes of emesis.  Patient was also noted  to have an AKI.  -Transition patient back to clear liquid diet -Bolus patient 1 L of LR -Hydromorphone and oxycodone for pain -Continue [name] with meals -Zofran for nausea -Outpatient GI follow-up  #Incidental lung nodule concerning for primary lung malignancy, posterior right apex 9/28 CT chest with interval enlargement of a nodule associated with an emphysematous bulla in the posterior right apex, 1.2 x 1.2 cm. Seen by PCCM who will follow outpatient. Due to location, may require surgical resection vs XRT. -outpatient PET and office visit to evaluate RUL nodule   #Constipation -daily Miralax    #COPD Continue albuterol and breo ellipta.   #BPH Continue Flomax.  Diet: Liquids, ADAT VTE: Heparin Code: Full  Dispo: Admit patient to Observation with expected length of stay less than 2 midnights.  PRick Duff MD PGY-3 Internal Medicine  Pager [phone]

## 2022-09-01 DIAGNOSIS — K859 Acute pancreatitis without necrosis or infection, unspecified: Secondary | ICD-10-CM | POA: Diagnosis not present

## 2022-09-01 LAB — CBC WITH DIFFERENTIAL/PLATELET
Abs Immature Granulocytes: 0.05 10*3/uL (ref 0.00–0.07)
Basophils Absolute: 0.1 10*3/uL (ref 0.0–0.1)
Basophils Relative: 1 %
Eosinophils Absolute: 0.5 10*3/uL (ref 0.0–0.5)
Eosinophils Relative: 5 %
HCT: 43.2 % (ref 39.0–52.0)
Hemoglobin: 14.6 g/dL (ref 13.0–17.0)
Immature Granulocytes: 1 %
Lymphocytes Relative: 21 %
Lymphs Abs: 2.2 10*3/uL (ref 0.7–4.0)
MCH: 30.1 pg (ref 26.0–34.0)
MCHC: 33.8 g/dL (ref 30.0–36.0)
MCV: 89.1 fL (ref 80.0–100.0)
Monocytes Absolute: 1.3 10*3/uL — ABNORMAL HIGH (ref 0.1–1.0)
Monocytes Relative: 12 %
Neutro Abs: 6.2 10*3/uL (ref 1.7–7.7)
Neutrophils Relative %: 60 %
Platelets: 413 10*3/uL — ABNORMAL HIGH (ref 150–400)
RBC: 4.85 MIL/uL (ref 4.22–5.81)
RDW: 16.6 % — ABNORMAL HIGH (ref 11.5–15.5)
WBC: 10.2 10*3/uL (ref 4.0–10.5)
nRBC: 0 % (ref 0.0–0.2)

## 2022-09-01 LAB — BASIC METABOLIC PANEL
Anion gap: 12 (ref 5–15)
BUN: <5 mg/dL — ABNORMAL LOW (ref 6–20)
CO2: 23 mmol/L (ref 22–32)
Calcium: 9.4 mg/dL (ref 8.9–10.3)
Chloride: 98 mmol/L (ref 98–111)
Creatinine, Ser: 1.15 mg/dL (ref 0.61–1.24)
GFR, Estimated: >60 mL/min (ref >60)
Glucose, Bld: 90 mg/dL (ref 70–99)
Potassium: 4.3 mmol/L (ref 3.5–5.1)
Sodium: 133 mmol/L — ABNORMAL LOW (ref 135–145)

## 2022-09-01 NOTE — Care Management Important Message (Signed)
Important Message  Patient Details  Name: Wesley [name] MRN: [id number] Date of Birth: 1966/08/28   Medicare Important Message Given:  Yes     Hannah Beat 09/01/2022, 1:40 PM

## 2022-09-01 NOTE — Progress Notes (Signed)
HD#4 Subjective:   Summary:  Wesley [name] is a 56 y.o. male with hx of chronic pancreatitis, chronic HCV, COPD, PAD, alcoholic peripheral neuropathy, chronic thrombosis of splenic vein presenting with persistent left sided abdominal pain.  Overnight Events: NAEO. Keep tring to advance to soft diet but nausea / emesis. Was able to keep down some apple sauce last night.   He states he's been able to eat two meals now without vomiting. Pt continues to endorse right sided abdominal pain though this continues to improve. Continues to endorse diminished appetite. Does not like taking creon but this has been easier since crushing and putting in apple sauce. Uses oxy and dilaudid. Pt used the bathroom yesterday with normal BM.  Hard to swallow and things feel like they get stuck in his throat. Trouble with solids and liquids. Saliva gets stuck. Vomiting because feels like things get stuck in throat. Has been an issue since at least a couple days before admission. He thinks he sees Dr. [name] outpatient. Continues to endorse nausea but this improves with Zofran. Food getting stuck in throat is what most limits his nutrition.  Vitals in last 24 hours: Vitals:   08/31/22 1631 08/31/22 2200 09/01/22 0526 09/01/22 0749  BP: 132/88 112/76 122/83 114/78  Pulse: 89 86 91 91  Resp: _0 Temp: 98.4 F (36.9 C) 97.8 F (36.6 C) 98.1 F (36.7 C) 98.7 F (37.1 C)  TempSrc: Oral Oral Oral Oral  SpO2: 95% 97% 94% 97%  Weight:      Height:       Supplemental O2: Room Air SpO2: 97 %   Physical Exam:  Constitutional: well-appearing male sitting in hospital bed, in no acute distress HENT: normocephalic atraumatic, dry mucous membranes  Eyes: conjunctiva non-erythematous Neck: supple Pulmonary/Chest: normal work of breathing on room air Abdominal: nondistended, mild TTP on left and at epigastrium  MSK: normal bulk and tone Neurological: alert & oriented x 3 Skin: warm and dry Psych:  Normal mood and affect  Filed Weights   08/27/22 1559  Weight: 74.4 kg     Intake/Output Summary (Last 24 hours) at 09/01/2022 1123 Last data filed at 09/01/2022 0645 Gross per 24 hour  Intake 1240 ml  Output --  Net 1240 ml   Net IO Since Admission: 2,265.94 mL [09/01/22 1123]  Pertinent Labs:    Latest Ref Rng & Units 09/01/2022    6:30 AM 08/31/2022    4:51 AM 08/30/2022    2:42 AM  CBC  WBC 4.0 - 10.5 K/uL 10.2  13.9  10.3   Hemoglobin 13.0 - 17.0 g/dL 14.6  16.2  14.6   Hematocrit 39.0 - 52.0 % 43.2  46.4  42.7   Platelets 150 - 400 K/uL 413  539  508        Latest Ref Rng & Units 09/01/2022    6:30 AM 08/31/2022    4:51 AM 08/30/2022    2:42 AM  CMP  Glucose 70 - 99 mg/dL 90  121  81   BUN 6 - 20 mg/dL <_1 Creatinine 0.61 - 1.24 mg/dL 1.15  1.56  1.07   Sodium 135 - 145 mmol/L 133  132  136   Potassium 3.5 - 5.1 mmol/L 4.3  4.1  4.5   Chloride 98 - 111 mmol/L 98  97  101   CO2 22 - 32 mmol/L _2 Calcium 8.9 -  10.3 mg/dL 9.4  10.4  9.2   Total Protein 6.5 - 8.1 g/dL  8.6  7.9   Total Bilirubin 0.3 - 1.2 mg/dL  0.8  0.8   Alkaline Phos 38 - 126 U/L  75  62   AST 15 - 41 U/L  16  14   ALT 0 - 44 U/L  9  10     Imaging: No results found.  Assessment/Plan:   Principal Problem:   Acute on chronic pancreatitis (HCC) Active Problems:   COPD with chronic bronchitis (HCC)   Incidental lung nodule, greater than or equal to 7m   Pancreatic pseudocyst   Patient Summary: Wesley [name] is a 56y.o. male with hx of chronic pancreatitis, chronic HCV, COPD, PAD, alcoholic peripheral neuropathy, chronic thrombosis of splenic vein presenting with persistent left sided abdominal pain likely secondary to progressive subscapular splenic fluid collections in the setting of acute on chronic pancreatitis with pseudocysts.   #Progressive Subcapsular Splenic Fluid Collections # Acute on Chronic pancreatitis with pseudocysts CT 08/27/22 with similar fullness and  low-density involving the distal pancreatic body and tail region. Multiple pseudocysts noted.  WBC 10.2. Tachycardia resolved. Afebrile. AKI, AGMA resolved. Seems to be improving in ability to tolerate po. Abdominal pain and nausea improved. -ADAT -Hydromorphone and oxycodone for pain -Continue [name] with meals -Zofran for nausea -Outpatient GI follow-up  #Esophageal dysphagia Patient reported symptoms of esophageal dysphasia today, limiting po intake in the setting of pancreatitis. 05/2022 EGD with normal esophagus, underwent dilation. Recommended upper endoscopy prn at that time. -GI consulted, appreciated recommendations -Referral to SLP  #Incidental lung nodule concerning for primary lung malignancy, posterior right apex 9/28 CT chest with interval enlargement of a nodule associated with an emphysematous bulla in the posterior right apex, 1.2 x 1.2 cm. Seen by PCCM who will follow outpatient. Due to location, may require surgical resection vs XRT. -outpatient PET and office visit to evaluate RUL nodule   #Constipation -daily Miralax    #COPD Continue albuterol and breo ellipta.   #BPH Continue Flomax.  Diet: Liquids, ADAT VTE: Heparin Code: Full  Dispo: Admit patient to Observation with expected length of stay less than 2 midnights.  PRick Duff MD PGY-3 Internal Medicine  Pager [phone]
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
## 2022-09-01 NOTE — Progress Notes (Signed)
Mobility Specialist - Progress Note   09/01/22 1100  Mobility  Activity Ambulated independently in hallway  Activity Response Tolerated well  Distance Ambulated (ft) 550 ft  $Mobility charge 1 Mobility  Level of Assistance Independent  Assistive Device None    Pt received in bed agreeable to mobility. Left in bed w/ call bell in reach and all needs met.   Paulla Dolly Mobility Specialist
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
## 2022-09-02 ENCOUNTER — Telehealth: Payer: Self-pay

## 2022-09-02 DIAGNOSIS — R911 Solitary pulmonary nodule: Secondary | ICD-10-CM | POA: Diagnosis not present

## 2022-09-02 DIAGNOSIS — K861 Other chronic pancreatitis: Secondary | ICD-10-CM | POA: Diagnosis not present

## 2022-09-02 DIAGNOSIS — K859 Acute pancreatitis without necrosis or infection, unspecified: Secondary | ICD-10-CM | POA: Diagnosis not present

## 2022-09-02 LAB — CBC
HCT: 42.9 % (ref 39.0–52.0)
Hemoglobin: 14.7 g/dL (ref 13.0–17.0)
MCH: 30.2 pg (ref 26.0–34.0)
MCHC: 34.3 g/dL (ref 30.0–36.0)
MCV: 88.1 fL (ref 80.0–100.0)
Platelets: 383 10*3/uL (ref 150–400)
RBC: 4.87 MIL/uL (ref 4.22–5.81)
RDW: 16.6 % — ABNORMAL HIGH (ref 11.5–15.5)
WBC: 9.3 10*3/uL (ref 4.0–10.5)
nRBC: 0 % (ref 0.0–0.2)

## 2022-09-02 LAB — BASIC METABOLIC PANEL
Anion gap: 14 (ref 5–15)
BUN: <5 mg/dL — ABNORMAL LOW (ref 6–20)
CO2: 22 mmol/L (ref 22–32)
Calcium: 9.5 mg/dL (ref 8.9–10.3)
Chloride: 97 mmol/L — ABNORMAL LOW (ref 98–111)
Creatinine, Ser: 1.13 mg/dL (ref 0.61–1.24)
GFR, Estimated: >60 mL/min (ref >60)
Glucose, Bld: 98 mg/dL (ref 70–99)
Potassium: 4.1 mmol/L (ref 3.5–5.1)
Sodium: 133 mmol/L — ABNORMAL LOW (ref 135–145)

## 2022-09-02 LAB — CULTURE, BLOOD (ROUTINE X 2)
Culture: NO GROWTH
Culture: NO GROWTH
Special Requests: ADEQUATE
Special Requests: ADEQUATE

## 2022-09-02 MED ORDER — FAMOTIDINE 20 MG PO TABS
20.0000 mg | ORAL_TABLET | Freq: Two times a day (BID) | ORAL | Status: DC
  Administered 2022-09-02 – 2022-09-03 (×3): 20 mg via ORAL
  Filled 2022-09-02 (×3): qty 1

## 2022-09-02 MED ORDER — OXYCODONE HCL 10 MG PO TABS: 10.0000 mg | ORAL_TABLET | ORAL | 0 refills | Status: DC | PRN

## 2022-09-02 NOTE — Progress Notes (Addendum)
HD#5 Subjective:   Summary:  Wesley [name] is a 56 y.o. male with hx of chronic pancreatitis, chronic HCV, COPD, PAD, alcoholic peripheral neuropathy, chronic thrombosis of splenic vein presenting with persistent left sided abdominal pain.  Overnight Events: NAEO.   Pt is complaining of abdominal pain with movement and coughing. Pain similar to yesterday. Has been able to tolerate PO clear liquid diet and now softs. Denies any vomiting for two days now. Explained etiology of pain due to acute on chronic pancreatitis. Denies alcohol use in the past 4 years. Explained barium swallow study and patient wishes to have this done before discharge. Pt is passing gas but denies bowel movement in a couple days.   Vitals in last 24 hours: Vitals:   09/01/22 2018 09/02/22 0425 09/02/22 0813 09/02/22 0838  BP: 130/84 119/85 115/81   Pulse: 91 95 94 93  Resp: _0 Temp: 98.4 F (36.9 C) 98.7 F (37.1 C) 99.3 F (37.4 C)   TempSrc: Oral Oral Oral   SpO2: 98% 94% 98% 92%  Weight:      Height:       Supplemental O2: Room Air SpO2: 92 %   Physical Exam:  Constitutional: well-appearing male sitting in hospital bed, in no acute distress HENT: normocephalic atraumatic, dry mucous membranes  Eyes: conjunctiva non-erythematous Neck: supple Cardio: RRR, no m/r/g Pulmonary/Chest: normal work of breathing on room air, CTAB Abdominal: nondistended, tenderness to deep palpation at LUQ, RUQ, and epigastrium MSK: normal bulk and tone Neurological: alert & oriented x 3 Skin: warm and dry Psych: Normal mood and affect  Filed Weights   08/27/22 1559  Weight: 74.4 kg    No intake or output data in the 24 hours ending 09/02/22 1326 Net IO Since Admission: 2,265.94 mL [09/02/22 1326]  Pertinent Labs:    Latest Ref Rng & Units 09/02/2022    2:26 AM 09/01/2022    6:30 AM 08/31/2022    4:51 AM  CBC  WBC 4.0 - 10.5 K/uL 9.3  10.2  13.9   Hemoglobin 13.0 - 17.0 g/dL 14.7  14.6  16.2    Hematocrit 39.0 - 52.0 % 42.9  43.2  46.4   Platelets 150 - 400 K/uL 383  413  539        Latest Ref Rng & Units 09/02/2022    2:26 AM 09/01/2022    6:30 AM 08/31/2022    4:51 AM  CMP  Glucose 70 - 99 mg/dL 98  90  121   BUN 6 - 20 mg/dL <5  <5  6   Creatinine 0.61 - 1.24 mg/dL 1.13  1.15  1.56   Sodium 135 - 145 mmol/L 133  133  132   Potassium 3.5 - 5.1 mmol/L 4.1  4.3  4.1   Chloride 98 - 111 mmol/L 97  98  97   CO2 22 - 32 mmol/L _1 Calcium 8.9 - 10.3 mg/dL 9.5  9.4  10.4   Total Protein 6.5 - 8.1 g/dL   8.6   Total Bilirubin 0.3 - 1.2 mg/dL   0.8   Alkaline Phos 38 - 126 U/L   75   AST 15 - 41 U/L   16   ALT 0 - 44 U/L   9     Imaging: No results found.  Assessment/Plan:   Principal Problem:   Acute on chronic pancreatitis (HCC) Active Problems:   COPD with chronic bronchitis (  [place])   Incidental lung nodule, greater than or equal to 39m   Pancreatic pseudocyst   Patient Summary: Wesley [name] is a 56y.o. male with hx of chronic pancreatitis, chronic HCV, COPD, PAD, alcoholic peripheral neuropathy, chronic thrombosis of splenic vein presenting with persistent left sided abdominal pain likely secondary to progressive subscapular splenic fluid collections in the setting of acute on chronic pancreatitis with pseudocysts.   #Progressive Subcapsular Splenic Fluid Collections # Acute on Chronic pancreatitis with pseudocysts CT 08/27/22 with similar fullness and low-density involving the distal pancreatic body and tail region. Multiple pseudocysts noted. Continues to tolerate advancing diet, now on solids without emesis so far today. Abdominal pain similar to yesterday. Likely to be discharged on oxycodone with close follow up for his pancreatitis. Will stay through night as MBS scheduled for tomorrow. Likely discharge after. -ADAT -transition to po pain regimen with oxycodone 10 mg prn -Continue [name] with meals -Zofran for nausea -Outpatient GI  follow-up  #Esophageal dysphagia Patient reports symptoms of esophageal dysphasia going on for 6 months, now limiting po intake in the setting of pancreatitis. 05/2022 EGD with normal esophagus, underwent dilation. SLP eval this admission with normal exam and no signs of overt aspiration.  -MBS tomorrow per SLP  #Incidental lung nodule concerning for primary lung malignancy, posterior right apex 9/28 CT chest with interval enlargement of a nodule associated with an emphysematous bulla in the posterior right apex, 1.2 x 1.2 cm. Seen by PCCM who will follow outpatient. Due to location, may require surgical resection vs XRT. -outpatient PET and office visit to evaluate RUL nodule   #Constipation -daily Miralax    #COPD Continue albuterol and breo ellipta.   #BPH Continue Flomax.  Diet: Liquids, ADAT VTE: Heparin Code: Full  Dispo: Admit patient to Observation with expected length of stay less than 2 midnights.  PRick Duff MD PGY-3 Internal Medicine  Pager [phone]

## 2022-09-02 NOTE — Telephone Encounter (Signed)
-----  Message from Sharyn Creamer, MD sent at 09/02/2022  1:58 PM EDT ----- Wesley [name], please arrange for follow up with Dr. [name] or APP in 1 month for globus sensation and chronic pancreatitis with pseudocysts. Thanks.

## 2022-09-02 NOTE — Telephone Encounter (Signed)
Pt scheduled for follow up with [name] on 10/03/22 at 9 am. Pt verbalized understanding.

## 2022-09-02 NOTE — Progress Notes (Signed)
Daily Rounding Note  09/02/2022, 12:12 PM  LOS: 5 days   SUBJECTIVE:   Chief complaint:    Dysphagia.  Globus sensation.  SLP evaluation yesterday afternoon.  Bedside swallow was unremarkable.  Patient did express sense of food getting stuck in his throat at all consistencies.  SLP surmised patient experiencing globus secondary to esophageal issues.  Plan is to pursue MBS once on solid food but its not been set up yet.   Since then he has gone from full liquids and now on regular diet.  Appetite is not great but he is swallowing well.  Still tolerating [name] so long as he takes it with applesauce.   OBJECTIVE:         Vital signs in last 24 hours:    Temp:  [98.4 F (36.9 C)-99.3 F (37.4 C)] 99.3 F (37.4 C) (10/03 0813) Pulse Rate:  [91-95] 93 (10/03 0838) Resp:  [16-17] 16 (10/03 0838) BP: (115-136)/(81-85) 115/81 (10/03 0813) SpO2:  [92 %-98 %] 92 % (10/03 0838) Last BM Date : 08/31/22 Filed Weights   08/27/22 1559  Weight: 74.4 kg   General: NAD.  Comfortable.  Does not look ill. Heart: RRR. Chest: No labored breathing or cough. Abdomen: Soft.  Slightly tender without guarding or rebound in the left upper quadrant.  Nondistended.  Soft Extremities: No CCE. Neuro/Psych: Calm, pleasant, cooperative.  No confusion.  No tremors or gross deficits.  Intake/Output from previous [place]: No intake/output data recorded.  Intake/Output this shift: No intake/output data recorded.  Lab Results: Recent Labs    08/31/22 0451 09/01/22 0630 09/02/22 0226  WBC 13.9* 10.2 9.3  HGB 16.2 14.6 14.7  HCT 46.4 43.2 42.9  PLT 539* 413* 383   BMET Recent Labs    08/31/22 0451 09/01/22 0630 09/02/22 0226  NA 132* 133* 133*  K 4.1 4.3 4.1  CL 97* 98 97*  CO2 16* 23 22  GLUCOSE 121* 90 98  BUN 6 <5* <5*  CREATININE 1.56* 1.15 1.13  CALCIUM 10.4* 9.4 9.5   LFT Recent Labs    08/31/22 0451  PROT 8.6*  ALBUMIN 3.1*   AST 16  ALT 9  ALKPHOS 75  BILITOT 0.8   PT/INR No results for input(s): "LABPROT", "INR" in the last 72 hours. Hepatitis Panel No results for input(s): "HEPBSAG", "HCVAB", "HEPAIGM", "HEPBIGM" in the last 72 hours.  Studies/Results: No results found.  Scheduled Meds:  famotidine  20 mg Oral BID   fluticasone furoate-vilanterol  1 puff Inhalation Daily   heparin  5,000 Units Subcutaneous Q8H   lipase/protease/amylase  36,000 Units Oral With snacks   lipase/protease/amylase  72,000 Units Oral TID WC   nicotine  21 mg Transdermal Daily   pantoprazole  40 mg Oral BID AC   polyethylene glycol  17 g Oral Daily   potassium chloride  40 mEq Oral BID   pregabalin  150 mg Oral QHS   tamsulosin  0.4 mg Oral Daily   Continuous Infusions: PRN Meds:.acetaminophen **OR** acetaminophen, albuterol, HYDROmorphone (DILAUDID) injection, ondansetron **OR** ondansetron (ZOFRAN) IV, oxyCODONE  ASSESMENT:   Globus sensation at level of throat, dysphagia to [name] capsule. Esophagram in late May showed trace reflux to the distal esophagus with some nonspecific esophageal fold thickening.  Barium tablet lodged at the GE junction but eventually passed after swallowing additional barium EGD in late June, more than 3 months ago with normal-appearing esophagus.  Dr. performed empiric Savary dilatation and noted  mild gastritis.  Patient's dysphagia/globus never improved.    Right lung apical nodule associated with emphysematous bulla, suspicious for primary lung malignancy.  No evidence that this nodule is impacting the esophagus.  Patient aware that he will be referred to a pulmonologist.  He has an appointment with Dr. Joya Gaskins on 11/29 but not sure if Dr. 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  History alcohol related pancreatitis.  Abstinent since 01/2021.  IgG4 normal.   Perisplenic loculated, small fluid collections, hypodensity/fullness at pancreatic tail on abdominal/pelvic chest CT of 9/27, 9/28.  Lipase of 9/27 201.  Eradicated HCV.  No evidence of cirrhosis or parenchymal liver disease on CT   PLAN     Pt continues to tolerate [name] so long as he consumes this with applesauce.  SLP arranging MBSS for tomorrow.    Azucena Freed  09/02/2022, 12:12 PM Phone [phone]

## 2022-09-03 ENCOUNTER — Inpatient Hospital Stay (HOSPITAL_COMMUNITY): Payer: Medicare Other

## 2022-09-03 DIAGNOSIS — K861 Other chronic pancreatitis: Secondary | ICD-10-CM | POA: Diagnosis not present

## 2022-09-03 DIAGNOSIS — R911 Solitary pulmonary nodule: Secondary | ICD-10-CM | POA: Diagnosis not present

## 2022-09-03 DIAGNOSIS — K859 Acute pancreatitis without necrosis or infection, unspecified: Secondary | ICD-10-CM | POA: Diagnosis not present

## 2022-09-03 LAB — BASIC METABOLIC PANEL
Anion gap: 13 (ref 5–15)
BUN: <5 mg/dL — ABNORMAL LOW (ref 6–20)
CO2: 21 mmol/L — ABNORMAL LOW (ref 22–32)
Calcium: 9.5 mg/dL (ref 8.9–10.3)
Chloride: 96 mmol/L — ABNORMAL LOW (ref 98–111)
Creatinine, Ser: 1.13 mg/dL (ref 0.61–1.24)
GFR, Estimated: >60 mL/min (ref >60)
Glucose, Bld: 100 mg/dL — ABNORMAL HIGH (ref 70–99)
Potassium: 4 mmol/L (ref 3.5–5.1)
Sodium: 130 mmol/L — ABNORMAL LOW (ref 135–145)

## 2022-09-03 LAB — CBC
HCT: 45.6 % (ref 39.0–52.0)
Hemoglobin: 15.7 g/dL (ref 13.0–17.0)
MCH: 30.3 pg (ref 26.0–34.0)
MCHC: 34.4 g/dL (ref 30.0–36.0)
MCV: 88 fL (ref 80.0–100.0)
Platelets: 388 10*3/uL (ref 150–400)
RBC: 5.18 MIL/uL (ref 4.22–5.81)
RDW: 16.4 % — ABNORMAL HIGH (ref 11.5–15.5)
WBC: 9 10*3/uL (ref 4.0–10.5)
nRBC: 0 % (ref 0.0–0.2)

## 2022-09-03 MED ORDER — FAMOTIDINE 20 MG PO TABS: 20.0000 mg | ORAL_TABLET | Freq: Two times a day (BID) | ORAL | 2 refills | Status: DC
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
## 2022-09-03 NOTE — Progress Notes (Signed)
Modified Barium Swallow Progress Note  Patient Details  Name: Wesley [name] MRN: [id number] Date of Birth: [birth year]  Today's Date: 09/03/2022  Modified Barium Swallow completed.  Full report located under Chart Review in the Imaging Section.  Brief recommendations include the following:  Clinical Impression  Pt elected to complete MBS due to complaints of globus sensation despite absence of clinical indications of aspiration/dysphagia on previous bedside swallow evaluation (10/2).  Results of MBS show a functional swallow. Minor swallow initiation delay to level of valleculae noted intermittently with puree consistency, and thin liquids via cup. Delay to level of pyriform sinuses noted with thin liquids via straw. No instances of airway intrusion noted throughout all trials despite consuming large volume boluses. Pt is missing all upper dentition, however, mastication of regular consistency was adequate provided extra time. During pill administration, pill became lodged in the upper esophageal area, and required a subsequent bolus of thin liquids to clear it. Pt has pmh significant for GERD and esophageal dilation. Suspect esophageal dysfunction may be the cause of globus sensation. Pt educated on different swallowing strategies/postural changes to assist in esophageal function. Recommend thin liquid/regular consistency diet, seated upright through meal and 30 minutes afterwards, small bites/sips, and alternate solids/liquids. No SLP f/u recommended   Swallow Evaluation Recommendations   Recommended Consults: Consider esophageal assessment   SLP Diet Recommendations: Thin liquid;Regular solids   Liquid Administration via: Cup;Straw   Medication Administration: Whole meds with liquid   Supervision: Patient able to self feed   Compensations: Slow rate;Small sips/bites;Follow solids with liquid   Postural Changes: Seated upright at 90 degrees;Remain semi-upright after after feeds/meals  (Comment)   Oral Care Recommendations: Oral care BID        Houston Siren 09/03/2022,2:03 PM
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
## 2022-09-03 NOTE — Progress Notes (Signed)
  Transition of Care (TOC) Screening Note   Patient Details  Name: Wesley [name] Date of Birth: 25-May-1966   Transition of Care [facility]) CM/SW Contact:    Bartholomew Crews, RN Phone Number: [phone] 09/03/2022, 2:48 PM  Patient discharge today  Transition of Care Department [facility]) has reviewed patient and no TOC needs have been identified at this time. We will continue to monitor patient advancement through interdisciplinary progression rounds. If new patient transition needs arise, please place a TOC consult.

## 2022-09-03 NOTE — Progress Notes (Signed)
Discharge instructions given at this time. Pt remains alert and oriented; stable at baseline.Pt awaiting volunteer to transport via [place] to ED entrance where pt's private vehicle is parked.

## 2022-09-04 ENCOUNTER — Telehealth: Payer: Self-pay | Admitting: Emergency Medicine

## 2022-09-04 ENCOUNTER — Telehealth: Payer: Self-pay

## 2022-09-04 DIAGNOSIS — R911 Solitary pulmonary nodule: Secondary | ICD-10-CM

## 2022-09-04 NOTE — Telephone Encounter (Signed)
Transition Care Management Unsuccessful Follow-up Telephone Call  Date of discharge and from where:  09/03/2022, [facility]  Attempts:  1st Attempt  Reason for unsuccessful TCM follow-up call:  Left voice message [phone], call back requested.

## 2022-09-05 NOTE — Telephone Encounter (Signed)
Pt returned call. Stated to pt that the PET has been ordered and that we will get this done before his appt on 11/16 with RB. Stated that PCCs would call him to let him know appt info for PET and he verbalized understanding. Nothing further needed.

## 2022-09-05 NOTE — Telephone Encounter (Signed)
ATC LVMTCB PET scan ordered and must be completed by 10/16/22. Pt is scheduled for HFU on 10/16/22.

## 2022-09-08 ENCOUNTER — Telehealth: Payer: Self-pay

## 2022-09-08 ENCOUNTER — Other Ambulatory Visit: Payer: Self-pay

## 2022-09-08 NOTE — Telephone Encounter (Signed)
Transition Care Management Unsuccessful Follow-up Telephone Call  Date of discharge and from where:  09/03/2022, [facility]  Attempts:  2nd Attempt  Reason for unsuccessful TCM follow-up call:  Left voice message-  [phone], call back requested.

## 2022-09-09 ENCOUNTER — Telehealth: Payer: Self-pay

## 2022-09-09 ENCOUNTER — Other Ambulatory Visit: Payer: Self-pay

## 2022-09-09 NOTE — Telephone Encounter (Signed)
Transition Care Management Follow-up Telephone Call Date of discharge and from where: 09/03/2022, [facility] How have you been since you were released from the hospital? He stated he is doing fine.  Any questions or concerns? No  Items Reviewed: Did the pt receive and understand the discharge instructions provided? Yes  Medications obtained and verified?  He said he will need to check to make sure that he has all of his meds. He was not home at the time of this call.  Other? No  Any new allergies since your discharge? No  Dietary orders reviewed? Yes Do you have support at home? Yes   Home Care and Equipment/Supplies: Were home health services ordered? no If so, what is the name of the agency? N/a  Has the agency set up a time to come to the patient's home? not applicable Were any new equipment or medical supplies ordered?  No What is the name of the medical supply agency? N/a Were you able to get the supplies/equipment? not applicable Do you have any questions related to the use of the equipment or supplies? No  Functional Questionnaire: (I = Independent and D = Dependent) ADLs: independent  Follow up appointments reviewed:  PCP Hospital f/u appt confirmed? Yes  Scheduled to see [facility] Medicine [facility] - 09/17/2022 for HFU but he wants to continue to see Dr Joya Gaskins at PCP and he does not want his appointment with Dr Joya Gaskins cancelled that is scheduled for 10/29/2022.  [facility] f/u appt confirmed? Yes  Scheduled to see GI- 10/03/2022  Are transportation arrangements needed? No  If their condition worsens, is the pt aware to call PCP or go to the Emergency Dept.? Yes Was the patient provided with contact information for the PCP's office or ED? Yes Was to pt encouraged to call back with questions or concerns? Yes

## 2022-09-10 ENCOUNTER — Other Ambulatory Visit: Payer: Self-pay | Admitting: Critical Care Medicine

## 2022-09-11 ENCOUNTER — Inpatient Hospital Stay: Payer: Medicare Other | Admitting: Physician Assistant

## 2022-09-11 NOTE — Telephone Encounter (Signed)
Pt scheduled for PET on 10/01/22  and OV on 10/16/22. Nothing further needed at this time.

## 2022-09-15 ENCOUNTER — Other Ambulatory Visit: Payer: Self-pay

## 2022-09-16 ENCOUNTER — Ambulatory Visit: Payer: Medicare Other | Admitting: Critical Care Medicine

## 2022-09-17 ENCOUNTER — Ambulatory Visit: Payer: Medicare Other | Admitting: Physician Assistant

## 2022-09-17 ENCOUNTER — Ambulatory Visit (INDEPENDENT_AMBULATORY_CARE_PROVIDER_SITE_OTHER): Payer: Medicare Other | Admitting: Student

## 2022-09-17 VITALS — BP 96/69 | HR 92 | Temp 98.6°F | Ht 68.0 in | Wt 161.7 lb

## 2022-09-17 DIAGNOSIS — R911 Solitary pulmonary nodule: Secondary | ICD-10-CM

## 2022-09-17 DIAGNOSIS — K859 Acute pancreatitis without necrosis or infection, unspecified: Secondary | ICD-10-CM | POA: Diagnosis not present

## 2022-09-17 DIAGNOSIS — R351 Nocturia: Secondary | ICD-10-CM

## 2022-09-17 DIAGNOSIS — K861 Other chronic pancreatitis: Secondary | ICD-10-CM

## 2022-09-17 DIAGNOSIS — M48062 Spinal stenosis, lumbar region with neurogenic claudication: Secondary | ICD-10-CM

## 2022-09-17 DIAGNOSIS — R110 Nausea: Secondary | ICD-10-CM | POA: Diagnosis not present

## 2022-09-17 DIAGNOSIS — J4489 Other specified chronic obstructive pulmonary disease: Secondary | ICD-10-CM | POA: Diagnosis not present

## 2022-09-17 DIAGNOSIS — R519 Headache, unspecified: Secondary | ICD-10-CM | POA: Diagnosis not present

## 2022-09-17 DIAGNOSIS — N401 Enlarged prostate with lower urinary tract symptoms: Secondary | ICD-10-CM

## 2022-09-17 MED ORDER — ACETAMINOPHEN 500 MG PO TABS: 1000.0000 mg | ORAL_TABLET | Freq: Three times a day (TID) | ORAL | 0 refills | Status: DC | PRN

## 2022-09-17 MED ORDER — ONDANSETRON HCL 4 MG PO TABS: 4.0000 mg | ORAL_TABLET | Freq: Three times a day (TID) | ORAL | 1 refills | Status: DC | PRN

## 2022-09-17 NOTE — Patient Instructions (Addendum)
  Thank you, [name], for allowing [place] to provide your care today. Today we discussed . . .  > Spinal stenosis       -We did not send you to get a another MRI of your back and we will refer you back to the back doctor for further evaluation.  We are also going to refer you to physical therapy in the interim.  Please call our office or go to the hospital if you have any worsening symptoms. > Headache       -Please take Tylenol 650 mg 3-4 times a [place] as needed for your headache.  If you have any associated symptoms or worsening of your headache please give [place] a call. > Trouble starting urination       -Please take your Flomax daily.  If you have any side effects or issues please give [place] a call.   Please return to our office in about 1 month.  If you would like to continue to see our office you may cancel your appointment with Dr. Asencion Noble on 10/29/2022.  However if you would like to continue seeing Dr. Bettina Gavia office you may call our office and inform us of that decision.  Please continue with your planned follow-up for a PET scan on 10/01/2022 and follow-up with pulmonology on 10/16/2022.  Please also follow-up with gastroenterology on 10/03/2022.   I have ordered the following labs for you:  Lab Orders         CBC with Diff         CMP14 + Anion Gap       Tests ordered today:  MRI of the lumbar spine   Referrals ordered today:   Referral Orders  No referral(s) requested today      I have ordered the following medication/changed the following medications:   Stop the following medications: Medications Discontinued During This Encounter  Medication Reason   ondansetron (ZOFRAN) 4 MG tablet Reorder   acetaminophen (TYLENOL) 500 MG tablet Reorder     Start the following medications: Meds ordered this encounter  Medications   ondansetron (ZOFRAN) 4 MG tablet    Sig: Take 1 tablet (4 mg total) by mouth every 8 (eight) hours as needed for nausea or vomiting.     Dispense:  20 tablet    Refill:  1   acetaminophen (TYLENOL) 500 MG tablet    Sig: Take 2 tablets (1,000 mg total) by mouth every 8 (eight) hours as needed for moderate pain.    Dispense:  120 tablet    Refill:  0      Follow up:  1 Month     Remember:     Should you have any questions or concerns please call the internal medicine clinic at [phone].     Wesley [name], [name]
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
## 2022-09-18 ENCOUNTER — Ambulatory Visit: Payer: Self-pay

## 2022-09-18 ENCOUNTER — Ambulatory Visit: Payer: Medicare Other | Admitting: Surgery

## 2022-09-18 ENCOUNTER — Encounter: Payer: Self-pay | Admitting: Surgery

## 2022-09-18 VITALS — BP 111/74 | HR 97 | Ht 68.0 in | Wt 161.0 lb

## 2022-09-18 DIAGNOSIS — M545 Low back pain, unspecified: Secondary | ICD-10-CM | POA: Diagnosis not present

## 2022-09-18 DIAGNOSIS — R29898 Other symptoms and signs involving the musculoskeletal system: Secondary | ICD-10-CM | POA: Diagnosis not present

## 2022-09-18 DIAGNOSIS — R2681 Unsteadiness on feet: Secondary | ICD-10-CM | POA: Diagnosis not present

## 2022-09-18 DIAGNOSIS — M542 Cervicalgia: Secondary | ICD-10-CM | POA: Diagnosis not present

## 2022-09-18 DIAGNOSIS — R519 Headache, unspecified: Secondary | ICD-10-CM | POA: Insufficient documentation

## 2022-09-18 DIAGNOSIS — G8929 Other chronic pain: Secondary | ICD-10-CM

## 2022-09-18 LAB — CBC WITH DIFFERENTIAL/PLATELET
Basophils Absolute: 0.1 10*3/uL (ref 0.0–0.2)
Basos: 1 %
EOS (ABSOLUTE): 0.7 10*3/uL — ABNORMAL HIGH (ref 0.0–0.4)
Eos: 6 %
Hematocrit: 41.1 % (ref 37.5–51.0)
Hemoglobin: 14.1 g/dL (ref 13.0–17.7)
Immature Grans (Abs): 0 10*3/uL (ref 0.0–0.1)
Immature Granulocytes: 0 %
Lymphocytes Absolute: 2.6 10*3/uL (ref 0.7–3.1)
Lymphs: 23 %
MCH: 30.3 pg (ref 26.6–33.0)
MCHC: 34.3 g/dL (ref 31.5–35.7)
MCV: 88 fL (ref 79–97)
Monocytes Absolute: 1.1 10*3/uL — ABNORMAL HIGH (ref 0.1–0.9)
Monocytes: 10 %
Neutrophils Absolute: 6.8 10*3/uL (ref 1.4–7.0)
Neutrophils: 60 %
Platelets: 374 10*3/uL (ref 150–450)
RBC: 4.65 x10E6/uL (ref 4.14–5.80)
RDW: 15.1 % (ref 11.6–15.4)
WBC: 11.3 10*3/uL — ABNORMAL HIGH (ref 3.4–10.8)

## 2022-09-18 LAB — CMP14 + ANION GAP
ALT: 7 IU/L (ref 0–44)
AST: 11 IU/L (ref 0–40)
Albumin/Globulin Ratio: 0.9 — ABNORMAL LOW (ref 1.2–2.2)
Albumin: 3.6 g/dL — ABNORMAL LOW (ref 3.8–4.9)
Alkaline Phosphatase: 78 IU/L (ref 44–121)
Anion Gap: 17 mmol/L (ref 10.0–18.0)
BUN/Creatinine Ratio: 5 — ABNORMAL LOW (ref 9–20)
BUN: 6 mg/dL (ref 6–24)
Bilirubin Total: <0.2 mg/dL (ref 0.0–1.2)
CO2: 22 mmol/L (ref 20–29)
Calcium: 9.5 mg/dL (ref 8.7–10.2)
Chloride: 100 mmol/L (ref 96–106)
Creatinine, Ser: 1.17 mg/dL (ref 0.76–1.27)
Globulin, Total: 3.9 g/dL (ref 1.5–4.5)
Glucose: 90 mg/dL (ref 70–99)
Potassium: 3.7 mmol/L (ref 3.5–5.2)
Sodium: 139 mmol/L (ref 134–144)
Total Protein: 7.5 g/dL (ref 6.0–8.5)
eGFR: 73 mL/min/{1.73_m2} (ref >59)

## 2022-09-18 NOTE — Assessment & Plan Note (Signed)
Patient complains of a headache on the left side of his head that started 3 days ago.  Denies any associated symptoms including vision changes, dizziness, confusion, fatigue, fever.  He has tried [facility] powder but nothing else for this.  He was advised to try acetaminophen and was given instructions to call or return to the office if he does not have resolution or if there are any new or worsening symptoms associated with this.

## 2022-09-18 NOTE — Assessment & Plan Note (Signed)
Patient with known BPH and LUTS who complains of urinary hesitancy and postvoid dribble but has not been taking his Flomax.  He states he will start taking this daily.  Reevaluate next visit.
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
## 2022-09-18 NOTE — Assessment & Plan Note (Signed)
Patient with chronic lower back pain and neurogenic claudication symptoms in bilateral lower extremities.  MRI of the lumbar spine from 12/29/2020 shows progression of chronic disc degeneration at L5/S1 with by foraminal L5 compression and moderate spinal stenosis. Last orthopedics note from 01/29/2021 at [facility] indicated that the expected treatment plan with PT which show improvement in his symptoms with time. Patient states that his symptoms of slowly progressed however he only attended 2 weeks of physical therapy.  He is still able to walk without assistance and has not had any falls.  Denies any saddle anesthesia or bowel or bladder incontinence.  He has stable distal lower extremity numbness.  After discussion with the patient he would like further evaluation of this again.  - MRI of the lumbar spine - Referral to orthopedics - Referral to physical therapy

## 2022-09-18 NOTE — Assessment & Plan Note (Signed)
Patient presents to the clinic for hospital follow-up after being admitted from 08/27/2022 until 09/03/2022 for acute on chronic pancreatitis.  Today he has no complaints of abdominal pain, nausea, or appetite, or any other symptoms similar to the ones that took him to the hospital.  He does have a GI follow-up on 10/03/2022 for this and for esophageal dysphagia and globus sensation. - Continue [name], Zofran, Protonix, Pepcid and GI follow-up on 10/03/2022

## 2022-09-18 NOTE — Assessment & Plan Note (Signed)
Patient with a incidental lung nodule found on CT during recent admission.  He has a PET scan scheduled for 10/01/2022 and follow-up with pulmonology on 10/16/2022.

## 2022-09-18 NOTE — Assessment & Plan Note (Signed)
Patient states that he is on Trelegy and as needed albuterol inhaler which she has only had to use once in the past few weeks.  Denies any symptoms of worsening respiratory status or infection.  No changes today. - Continue Trelegy and as needed albuterol inhaler

## 2022-09-19 ENCOUNTER — Telehealth: Payer: Self-pay | Admitting: Emergency Medicine

## 2022-09-19 NOTE — Telephone Encounter (Signed)
[employer] called and stated authorization for PET scan has been approved with the CT results that was faxed yesterday.   Auth#: [id number] Valid from 09/17/22 - 11/01/22

## 2022-09-19 NOTE — Progress Notes (Signed)
Internal Medicine Clinic Attending  I saw and evaluated the patient.  I personally confirmed the key portions of the history and exam documented by Dr. Nikki Dom and I reviewed pertinent patient test results.  The assessment, diagnosis, and plan were formulated together and I agree with the documentation in the resident's note. PSA 2.5 03/2022.

## 2022-09-22 NOTE — Progress Notes (Signed)
Called and spoke to the patient about the results of his CBC and CMP.  He understands the results and has his MRI is scheduled for 09/24/2022.  He also has scheduled appointment with orthopedics on 10/07/2022.  No change in management at this time.  All questions were answered.

## 2022-09-24 ENCOUNTER — Ambulatory Visit
Admission: RE | Admit: 2022-09-24 | Discharge: 2022-09-24 | Disposition: A | Discharge status: Home / Self Care | Payer: Medicare Other | Source: Ambulatory Visit | Attending: Internal Medicine | Admitting: Internal Medicine

## 2022-09-24 ENCOUNTER — Ambulatory Visit
Admission: RE | Admit: 2022-09-24 | Discharge: 2022-09-24 | Disposition: A | Discharge status: Home / Self Care | Payer: Medicare Other | Source: Ambulatory Visit | Attending: Surgery | Admitting: Surgery

## 2022-09-24 DIAGNOSIS — M48062 Spinal stenosis, lumbar region with neurogenic claudication: Secondary | ICD-10-CM

## 2022-09-24 DIAGNOSIS — M542 Cervicalgia: Secondary | ICD-10-CM

## 2022-09-25 ENCOUNTER — Ambulatory Visit: Payer: Medicare Other | Admitting: Physician Assistant

## 2022-09-30 ENCOUNTER — Other Ambulatory Visit: Payer: Self-pay | Admitting: Critical Care Medicine

## 2022-09-30 DIAGNOSIS — R110 Nausea: Secondary | ICD-10-CM

## 2022-10-01 ENCOUNTER — Ambulatory Visit (HOSPITAL_COMMUNITY)
Admission: RE | Admit: 2022-10-01 | Discharge: 2022-10-01 | Disposition: A | Discharge status: Home / Self Care | Payer: Medicare Other | Source: Ambulatory Visit | Attending: Emergency Medicine | Admitting: Emergency Medicine

## 2022-10-01 ENCOUNTER — Ambulatory Visit
Admission: RE | Admit: 2022-10-01 | Discharge: 2022-10-01 | Disposition: A | Discharge status: Home / Self Care | Payer: Medicare Other | Source: Ambulatory Visit | Attending: Internal Medicine | Admitting: Internal Medicine

## 2022-10-01 ENCOUNTER — Ambulatory Visit
Admission: RE | Admit: 2022-10-01 | Discharge: 2022-10-01 | Disposition: A | Discharge status: Home / Self Care | Payer: Medicare Other | Source: Ambulatory Visit | Attending: Surgery | Admitting: Surgery

## 2022-10-01 DIAGNOSIS — R911 Solitary pulmonary nodule: Secondary | ICD-10-CM | POA: Insufficient documentation

## 2022-10-01 DIAGNOSIS — M5416 Radiculopathy, lumbar region: Secondary | ICD-10-CM | POA: Diagnosis not present

## 2022-10-01 DIAGNOSIS — M4802 Spinal stenosis, cervical region: Secondary | ICD-10-CM | POA: Diagnosis not present

## 2022-10-01 DIAGNOSIS — R918 Other nonspecific abnormal finding of lung field: Secondary | ICD-10-CM | POA: Diagnosis not present

## 2022-10-01 DIAGNOSIS — M542 Cervicalgia: Secondary | ICD-10-CM | POA: Diagnosis not present

## 2022-10-01 LAB — GLUCOSE, CAPILLARY: Glucose-Capillary: 107 mg/dL — ABNORMAL HIGH (ref 70–99)

## 2022-10-01 MED ORDER — FLUDEOXYGLUCOSE F - 18 (FDG) INJECTION
8.2200 | Freq: Once | INTRAVENOUS | Status: AC | PRN
  Administered 2022-10-01: 8.22 via INTRAVENOUS

## 2022-10-02 NOTE — Progress Notes (Deleted)
10/02/2022 Dray N Gaffin [id number] 1966/04/24  Referring provider: [name], MD Primary GI doctor: Dr. [name]  ASSESSMENT AND PLAN:   There are no diagnoses linked to this encounter.   History of Present Illness:  56 y.o. male  with a past medical history of COPD chronic pain, history of alcohol abuse (quit 01/2018), history of acute recurrent pancreatitis superimposed on chronic pancreatitis, HCV status post SVR, hepatic steatosis, history of cholecystectomy 2020 and others listed below, returns to clinic today for evaluation of globulus sensation, pancreatitis.  Patient has significant work-up as outlined in Dr. Vivia Ewing note 05/19/2022 for patient's acute on chronic pancreatitis and dysphagia. Patient states he was having dysphagia, stable weight, still smoking but no alcohol. 04/21/2022 barium esophagram for dysphagia showed normal esophagus, mild fold thickening, trace reflux distal third, 13 mm barium tablet became lodged at GE junction and passed after several attempts drinking water.  Was following with GI [facility] 05/22/2022 EGD with Dr. [name] for dysphagia showed normal esophagus, dilated with 17 mm, mild nonulcer gastritis, normal duodenum, negative for H. pylori. 9/14 admission for infected splenic hematoma, treated with antibiotics, no intervention required as it was contained per surgery and IR. 9/27 readmitted for persistent pain blood cultures negative. 09/03/2019 3 GI consulted for continuing dysphagia, speech therapy consult ordered, on PPI twice daily, consider outpatient esophageal manometry. 09/03/2022 MBS mild aspiration risk, suspected esophageal dysfunction the reason.  No treatment. 08/28/2022 pleural effusion possible mass showed interval enlargement of nodules tissue with emphysematous bulla right apex suspicious for primary lung cancer, pending PET scan with Dr. Lamonte Sakai.  ***  He  reports that he has been smoking cigarettes. He has a 31.50 pack-year  smoking history. He has never used smokeless tobacco. He reports that he does not currently use alcohol after a past usage of about 25.0 standard drinks of alcohol per week. He reports that he does not use drugs. His family history includes Breast cancer in his mother; Cirrhosis (age of onset: 6) in his father; Colon cancer (age of onset: 44) in his cousin; Hypertension in his mother; Kidney cancer in his father; Liver cancer in his maternal grandmother; Multiple sclerosis in his sister.   Current Medications:     Current Outpatient Medications (Respiratory):    albuterol (VENTOLIN HFA) 108 (90 Base) MCG/ACT inhaler, Inhale 2 puffs into the lungs every 6 (six) hours as needed for wheezing or shortness of breath.   fluticasone furoate-vilanterol (BREO ELLIPTA) 200-25 MCG/ACT AEPB, Inhale 1 puff into the lungs daily.  Current Outpatient Medications (Analgesics):    acetaminophen (TYLENOL) 500 MG tablet, Take 2 tablets (1,000 mg total) by mouth every 8 (eight) hours as needed for moderate pain.   oxyCODONE 10 MG TABS, Take 1 tablet (10 mg total) by mouth every 4 (four) hours as needed for moderate pain.  Current Outpatient Medications (Hematological):    folic acid (FOLVITE) 1 MG tablet, Take 1 tablet (1 mg total) by mouth daily.   vitamin B-12 (CYANOCOBALAMIN) 1000 MCG tablet, Take 1 tablet (1,000 mcg total) by mouth daily.  Current Outpatient Medications (Other):    amitriptyline ([name]) 50 MG tablet, Take 1 tablet (50 mg total) by mouth at bedtime.   baclofen (LIORESAL) 10 MG tablet, Take 0.5-1 tablets (5-10 mg total) by mouth every 8 (eight) hours as needed. (Patient taking differently: Take 5-10 mg by mouth every 8 (eight) hours as needed for muscle spasms.)   DULoxetine (CYMBALTA) 60 MG capsule, TAKE 1 CAPSULE BY MOUTH DAILY (Patient  taking differently: Take 60 mg by mouth daily.)   famotidine (PEPCID) 20 MG tablet, Take 1 tablet (20 mg total) by mouth 2 (two) times daily.   lidocaine  (LIDODERM) 5 %, Place 1 patch onto the skin daily. Remove & Discard patch within 12 hours or as directed by MD   lipase/protease/amylase ([name]) 36000 UNITS CPEP capsule, Take 2 capsules (72,000 Units total) by mouth 3 (three) times daily with meals AND 1 capsule (36,000 Units total) with snacks. Max: 9 capsules per [place].   Multiple Vitamin (MULTIVITAMIN WITH MINERALS) TABS tablet, Take 1 tablet by mouth daily.   ondansetron (ZOFRAN) 4 MG tablet, Take 1 tablet (4 mg total) by mouth every 8 (eight) hours as needed for nausea or vomiting.   pantoprazole (PROTONIX) 40 MG tablet, TAKE 1 TABLET BY MOUTH TWICE  DAILY BEFORE A MEAL   polyethylene glycol (MIRALAX / GLYCOLAX) 17 g packet, Take 17 g by mouth daily as needed (constipation). PRN   pregabalin (LYRICA) 150 MG capsule, Take 1 capsule (150 mg total) by mouth in the morning, at noon, and at bedtime.   senna (SENOKOT) 8.6 MG TABS tablet, Take 1 tablet (8.6 mg total) by mouth daily.   tamsulosin (FLOMAX) 0.4 MG CAPS capsule, TAKE 1 CAPSULE BY MOUTH DAILY   thiamine 100 MG tablet, TAKE 1 TABLET (100 MG TOTAL) BY MOUTH DAILY. (Patient taking differently: Take 100 mg by mouth daily.)   Vitamin D, Ergocalciferol, (DRISDOL) 1.25 MG (50000 UNIT) CAPS capsule, Take 1 capsule (50,000 Units total) by mouth every 7 (seven) days. (Patient taking differently: Take 50,000 Units by mouth every Tuesday.)  Surgical History:  He  has a past surgical history that includes Colostomy (1987); Ulnar nerve transposition (Right, 05/31/2015); Colostomy takedown (1988); Cholecystectomy (N/A, 12/13/2018); arm surgery (Right, 2017-2018); Colonoscopy; Esophagogastroduodenoscopy (egd) with propofol (N/A, 03/21/2021); EUS (N/A, 03/21/2021); biopsy (03/21/2021); Fine Needle Aspiration Biopsy (03/21/2021); EUS (N/A, 05/22/2021); Esophagogastroduodenoscopy (egd) with propofol (N/A, 05/22/2021); biopsy (05/22/2021); Fine needle aspiration (05/22/2021); and Upper gastrointestinal  endoscopy.  Current Medications, Allergies, Past Medical History, Past Surgical History, Family History and Social History were reviewed in [employer] record.  Physical Exam: There were no vitals taken for this visit. General:   Pleasant, well developed male in no acute distress Heart : Regular rate and rhythm; no murmurs Pulm: Clear anteriorly; no wheezing Abdomen:  {BlankSingle:19197::"Distended","Ridged","Soft"}, {BlankSingle:19197::"Flat","Obese","Non-distended"} AB, {BlankSingle:19197::"Absent","Hyperactive, tinkling","Hypoactive","Sluggish","Active"} bowel sounds. {actendernessAB:27319} tenderness {anatomy; site abdomen:5010}. {BlankMultiple:19196::"Without guarding","With guarding","Without rebound","With rebound"}, No organomegaly appreciated. Rectal: {acrectalexam:27461} Extremities:  {With/without:5700}  edema. Neurologic:  Alert and  oriented x4;  No focal deficits.  Psych:  Cooperative. Normal mood and affect.   Vladimir Crofts, PA-C 10/02/22

## 2022-10-03 ENCOUNTER — Ambulatory Visit: Payer: Medicare Other | Admitting: Physician Assistant

## 2022-10-03 NOTE — Progress Notes (Signed)
Called and spoke to the patient about the results of his MRI of the lumbar spine.  After clinic visit he saw orthopedics and they ordered a MRI of his cervical spine.  He also had a PET scan of his chest.  He had questions about these extra exams.  We discussed the results of his lumbar MRI that showed no significant change from his prior MRI in 2022.  Cervical MRI showed foraminal stenosis and he has an appointment with Dr. [name] on 10/07/2022 to discuss this further.  His main concern was about the results of his PET scan.  He did have a hyperbolic nodule concerning for neoplasm.  I informed him of this and that it could be cancer.  He has an appointment to see Dr. Lamonte Sakai on 10/16/2022.  All questions were answered.

## 2022-10-07 ENCOUNTER — Encounter: Payer: Self-pay | Admitting: Orthopaedic Surgery

## 2022-10-07 ENCOUNTER — Ambulatory Visit: Payer: Medicare Other | Admitting: Orthopaedic Surgery

## 2022-10-07 ENCOUNTER — Other Ambulatory Visit: Payer: Self-pay

## 2022-10-07 VITALS — BP 94/65 | HR 142 | Ht 68.0 in | Wt 161.0 lb

## 2022-10-07 DIAGNOSIS — M5136 Other intervertebral disc degeneration, lumbar region: Secondary | ICD-10-CM

## 2022-10-08 DIAGNOSIS — Z79899 Other long term (current) drug therapy: Secondary | ICD-10-CM | POA: Diagnosis not present

## 2022-10-08 DIAGNOSIS — R1013 Epigastric pain: Secondary | ICD-10-CM | POA: Diagnosis not present

## 2022-10-08 DIAGNOSIS — M545 Low back pain, unspecified: Secondary | ICD-10-CM | POA: Diagnosis not present

## 2022-10-08 DIAGNOSIS — Z6823 Body mass index (BMI) 23.0-23.9, adult: Secondary | ICD-10-CM | POA: Diagnosis not present

## 2022-10-08 DIAGNOSIS — Z79891 Long term (current) use of opiate analgesic: Secondary | ICD-10-CM | POA: Diagnosis not present

## 2022-10-08 DIAGNOSIS — G8929 Other chronic pain: Secondary | ICD-10-CM | POA: Diagnosis not present

## 2022-10-08 DIAGNOSIS — F17210 Nicotine dependence, cigarettes, uncomplicated: Secondary | ICD-10-CM | POA: Diagnosis not present

## 2022-10-10 DIAGNOSIS — Z79899 Other long term (current) drug therapy: Secondary | ICD-10-CM | POA: Diagnosis not present

## 2022-10-13 ENCOUNTER — Other Ambulatory Visit: Payer: Self-pay
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
## 2022-10-15 ENCOUNTER — Other Ambulatory Visit: Payer: Self-pay

## 2022-10-16 ENCOUNTER — Encounter: Payer: Self-pay | Admitting: Emergency Medicine

## 2022-10-16 ENCOUNTER — Other Ambulatory Visit: Payer: Self-pay

## 2022-10-16 ENCOUNTER — Ambulatory Visit (INDEPENDENT_AMBULATORY_CARE_PROVIDER_SITE_OTHER): Payer: Medicare Other | Admitting: Emergency Medicine

## 2022-10-16 VITALS — BP 120/68 | HR 114 | Temp 98.4°F | Ht 68.5 in | Wt 155.0 lb

## 2022-10-16 DIAGNOSIS — J4489 Other specified chronic obstructive pulmonary disease: Secondary | ICD-10-CM

## 2022-10-16 DIAGNOSIS — R9389 Abnormal findings on diagnostic imaging of other specified body structures: Secondary | ICD-10-CM

## 2022-10-16 DIAGNOSIS — R911 Solitary pulmonary nodule: Secondary | ICD-10-CM
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
## 2022-10-16 NOTE — Patient Instructions (Addendum)
We reviewed your PET scan today. We will work on setting up bronchoscopy with ultrasound in order to evaluate lymph nodes in the chest We will make referral to interventional radiology to consider a needle biopsy of your right upper lobe pulmonary nodule Depending on biopsy results we will decide next steps in evaluation and treatment Continue to work on decreasing your cigarettes. Continue Breo once daily.  Rinse and gargle after using. Follow with Dr Lamonte Sakai in 1 month or next available
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
## 2022-10-17 ENCOUNTER — Other Ambulatory Visit (HOSPITAL_COMMUNITY): Payer: Medicare Other

## 2022-10-17 NOTE — Progress Notes (Signed)
Criselda Peaches, MD  Donita Brooks D; P [facility] Procedure Requests Approved for CT guided bx left internal mammary LN.  The right upper lobe pulmonary nodule is NOT amenable to biopsy given its location along the fissure and on a large emphysematous bleb.  Signed,  Criselda Peaches, MD  Pager: [phone] Clinic: [phone]

## 2022-10-20 ENCOUNTER — Other Ambulatory Visit: Payer: Self-pay

## 2022-10-20 ENCOUNTER — Encounter (HOSPITAL_COMMUNITY): Payer: Self-pay | Admitting: Emergency Medicine

## 2022-10-20 NOTE — Progress Notes (Signed)
PCP - Dr. Asencion Noble  Chest x-ray - 08/13/22 EKG - 08/14/22  ERAS Protcol - Clears until 1000  COVID TEST- DOS  Anesthesia review: N  Patient verbally denies any shortness of breath, fever, cough and chest pain during phone call   -------------  SDW INSTRUCTIONS given:  Your procedure is scheduled on Monday, 10/27/22 at 1300.  Report to [facility] Main Entrance "A" at 1000 A.M., and check in at the Admitting office.  Call this number if you have problems the morning of surgery:  [phone]   Remember:  Do not eat after midnight the night before your surgery  You may drink clear liquids until 1000 the morning of your surgery.   Clear liquids allowed are: Water, Non-Citrus Juices (without pulp), Carbonated Beverages, Clear Tea, Black Coffee Only, and Gatorade    Take these medicines the morning of surgery with A SIP OF WATER  DULoxetine (CYMBALTA)  famotidine (PEPCID)  fluticasone furoate-vilanterol (BREO ELLIPTA)  oxycodone (ROXICODONE)  pantoprazole (PROTONIX)  pregabalin (LYRICA)  tamsulosin (FLOMAX)  acetaminophen (TYLENOL)-if needed albuterol (VENTOLIN HFA)-if needed (Please bring with you on [place] of surgery) baclofen (LIORESAL)-if needed ondansetron (ZOFRAN)-if needed   As of today, STOP taking any Aspirin (unless otherwise instructed by your surgeon) Aleve, Naproxen, Ibuprofen, Motrin, Advil, Goody's, BC's, all herbal medications, fish oil, and all vitamins.                      Do not wear jewelry, make up, or nail polish            Do not wear lotions, powders, perfumes/colognes, or deodorant.            Do not shave 48 hours prior to surgery.  Men may shave face and neck.            Do not bring valuables to the hospital.            [facility] is not responsible for any belongings or valuables.  Do NOT Smoke (Tobacco/Vaping) 24 hours prior to your procedure If you use a CPAP at night, you may bring all equipment for your overnight stay.   Contacts,  glasses, dentures or bridgework may not be worn into surgery.      For patients admitted to the hospital, discharge time will be determined by your treatment team.   Patients discharged the [place] of surgery will not be allowed to drive home, and someone needs to stay with them for 24 hours.    Special instructions:   [place]- Preparing For Surgery  Before surgery, you can play an important role. Because skin is not sterile, your skin needs to be as free of germs as possible. You can reduce the number of germs on your skin by washing with CHG (chlorahexidine gluconate) Soap before surgery.  CHG is an antiseptic cleaner which kills germs and bonds with the skin to continue killing germs even after washing.    Oral Hygiene is also important to reduce your risk of infection.  Remember - BRUSH YOUR TEETH THE MORNING OF SURGERY WITH YOUR REGULAR TOOTHPASTE  Please do not use if you have an allergy to CHG or antibacterial soaps. If your skin becomes reddened/irritated stop using the CHG.  Do not shave (including legs and underarms) for at least 48 hours prior to first CHG shower. It is OK to shave your face.  Please follow these instructions carefully.   Shower the NIGHT BEFORE SURGERY and the [facility]  OF SURGERY with DIAL Soap.   Pat yourself dry with a CLEAN TOWEL.  Wear CLEAN PAJAMAS to bed the night before surgery  Place CLEAN SHEETS on your bed the night of your first shower and DO NOT SLEEP WITH PETS.   [place] of Surgery: Please shower morning of surgery  Wear Clean/Comfortable clothing the morning of surgery Do not apply any deodorants/lotions.   Remember to brush your teeth WITH YOUR REGULAR TOOTHPASTE.   Questions were answered. Patient verbalized understanding of instructions.

## 2022-10-21 DIAGNOSIS — R9389 Abnormal findings on diagnostic imaging of other specified body structures: Secondary | ICD-10-CM | POA: Insufficient documentation

## 2022-10-21 NOTE — Assessment & Plan Note (Signed)
Continue to work on decreasing your cigarettes. Continue Breo once daily.  Rinse and gargle after using. Follow with Dr Lamonte Sakai in 1 month or next available

## 2022-10-21 NOTE — Assessment & Plan Note (Signed)
Reviewed the PET scan performed on 08/31/2022 with the patient.  The right upper lobe nodule is consistent with a primary lung cancer.  Unfortunately it is surrounded by emphysematous change, blebs.  Unclear to me that the nodule is approachable by navigational bronchoscopy.  There does appear to be a window by which can be accessed via transthoracic needle biopsy.  I will ask interventional radiology to make an attempt if they believe it is accessible.  There is some slight enlargement of a precarinal node with some mild hypermetabolism.  This will need to be evaluated by EBUS.  I will work on setting this up as well.  We reviewed your PET scan today. We will work on setting up bronchoscopy with ultrasound in order to evaluate lymph nodes in the chest We will make referral to interventional radiology to consider a needle biopsy of your right upper lobe pulmonary nodule Depending on biopsy results we will decide next steps in evaluation and treatment

## 2022-10-22 ENCOUNTER — Other Ambulatory Visit: Payer: Self-pay | Admitting: Critical Care Medicine

## 2022-10-24 NOTE — Progress Notes (Signed)
Patient was called and informed that the surgery time for Monday was changed from 13:00 to 09:30 o'clock. Patient was instructed to be at the hospital at 06:30 o'clock. Patient verbalized understanding.

## 2022-10-24 NOTE — Telephone Encounter (Signed)
Requested Prescriptions  Pending Prescriptions Disp Refills   DULoxetine (CYMBALTA) 60 MG capsule [Pharmacy Med Name: DULoxetine HCl 60 MG Oral Capsule Delayed Release Particles] 90 capsule 3    Sig: TAKE 1 CAPSULE BY MOUTH DAILY     Psychiatry: Antidepressants - SNRI - duloxetine Passed - 10/22/2022 10:10 PM      Passed - Cr in normal range and within 360 days    Creat  Date Value Ref Range Status  07/26/2019 0.94 0.70 - 1.33 mg/dL Final    Comment:    For patients >10 years of age, the reference limit for Creatinine is approximately 13% higher for people identified as African-American. .    Creatinine, Ser  Date Value Ref Range Status  09/17/2022 1.17 0.76 - 1.27 mg/dL Final   Creatinine,U  Date Value Ref Range Status  07/24/2010  mg/dL Final   57.2 (NOTE)  Cutoff Values for Urine Drug Screen:        Drug Class           Cutoff (ng/mL)        Amphetamines            1000        Barbiturates             200        Cocaine Metabolites      300        Benzodiazepines          200        Methadone                 300        Opiates                 2000        Phencyclidine             25        Propoxyphene             300        Marijuana Metabolites     50  For medical purposes only.   Creatinine, Urine  Date Value Ref Range Status  02/08/2018 26.02 mg/dL Final    Comment:    Performed at [facility] [address] [place], [place] [address]         Passed - eGFR is 30 or above and within 360 days    GFR calc Af Amer  Date Value Ref Range Status  10/08/2020 106 >59 mL/min/1.73 Final    Comment:    **In accordance with recommendations from the NKF-ASN Task force,**   [facility] is in the process of updating its eGFR calculation to the   2021 CKD-EPI creatinine equation that estimates kidney function   without a race variable.    GFR, Estimated  Date Value Ref Range Status  09/03/2022 >60 >60 mL/min Final    Comment:    (NOTE) Calculated using the CKD-EPI  Creatinine Equation (2021)    GFR  Date Value Ref Range Status  07/01/2021 71.97 >60.00 mL/min Final    Comment:    Calculated using the CKD-EPI Creatinine Equation (2021)   eGFR  Date Value Ref Range Status  09/17/2022 73 >59 mL/min/1.73 Final         Passed - Completed PHQ-2 or PHQ-9 in the last 360 days      Passed - Last BP in normal range    BP Readings from Last  1 Encounters:  10/16/22 120/68         Passed - Valid encounter within last 6 months    Recent Outpatient Visits           1 month ago Hospital discharge follow-up   [place], [place] W, NP   4 months ago COPD with chronic bronchitis [facility])   [place] [name], MD   6 months ago [place] [name], MD   7 months ago Strep throat   [place] [name], MD   11 months ago Neuropathic pain   [place] [name], MD       Future Appointments             In 3 weeks Byrum, Rose Fillers, MD [facility]

## 2022-10-26 NOTE — Anesthesia Preprocedure Evaluation (Addendum)
Anesthesia Evaluation  Patient identified by MRN, date of birth, ID band Patient awake    Reviewed: Allergy & Precautions, NPO status , Patient's Chart, lab work & pertinent test results  Airway Mallampati: II  TM Distance: >3 FB Neck ROM: Full    Dental  (+) Edentulous Upper, Dental Advisory Given   Pulmonary COPD, Current Smoker and Patient abstained from smoking.   Pulmonary exam normal breath sounds clear to auscultation       Cardiovascular + Peripheral Vascular Disease  Normal cardiovascular exam Rhythm:Regular Rate:Normal     Neuro/Psych  Headaches  negative psych ROS   GI/Hepatic ,GERD  Medicated,,(+)     substance abuse  alcohol use, Hepatitis -, C  Endo/Other  negative endocrine ROS    Renal/GU negative Renal ROS  negative genitourinary   Musculoskeletal  (+) Arthritis ,  narcotic dependent  Abdominal   Peds  Hematology negative hematology ROS (+)   Anesthesia Other Findings   Reproductive/Obstetrics                             Anesthesia Physical Anesthesia Plan  ASA: 3  Anesthesia Plan: General   Post-op Pain Management: Minimal or no pain anticipated   Induction: Intravenous  PONV Risk Score and Plan: 1 and Midazolam, Dexamethasone and Ondansetron  Airway Management Planned: Oral ETT  Additional Equipment:   Intra-op Plan:   Post-operative Plan: Extubation in OR  Informed Consent: I have reviewed the patients History and Physical, chart, labs and discussed the procedure including the risks, benefits and alternatives for the proposed anesthesia with the patient or authorized representative who has indicated his/her understanding and acceptance.     Dental advisory given  Plan Discussed with: CRNA  Anesthesia Plan Comments:        Anesthesia Quick Evaluation

## 2022-10-27 ENCOUNTER — Ambulatory Visit (HOSPITAL_BASED_OUTPATIENT_CLINIC_OR_DEPARTMENT_OTHER): Payer: Medicare Other | Admitting: Anesthesiology

## 2022-10-27 ENCOUNTER — Ambulatory Visit (HOSPITAL_COMMUNITY): Payer: Medicare Other | Admitting: Anesthesiology

## 2022-10-27 ENCOUNTER — Ambulatory Visit (HOSPITAL_COMMUNITY)
Admission: RE | Admit: 2022-10-27 | Discharge: 2022-10-27 | Disposition: A | Discharge status: Home / Self Care | Payer: Medicare Other | Attending: Emergency Medicine | Admitting: Emergency Medicine

## 2022-10-27 ENCOUNTER — Encounter (HOSPITAL_COMMUNITY): Payer: Self-pay | Admitting: Emergency Medicine

## 2022-10-27 ENCOUNTER — Other Ambulatory Visit: Payer: Self-pay

## 2022-10-27 ENCOUNTER — Encounter (HOSPITAL_COMMUNITY)
Admission: RE | Disposition: A | Discharge status: Home / Self Care | Payer: Self-pay | Source: Home / Self Care | Attending: Emergency Medicine

## 2022-10-27 DIAGNOSIS — I739 Peripheral vascular disease, unspecified: Secondary | ICD-10-CM | POA: Insufficient documentation

## 2022-10-27 DIAGNOSIS — J449 Chronic obstructive pulmonary disease, unspecified: Secondary | ICD-10-CM | POA: Diagnosis not present

## 2022-10-27 DIAGNOSIS — R590 Localized enlarged lymph nodes: Secondary | ICD-10-CM

## 2022-10-27 DIAGNOSIS — F17200 Nicotine dependence, unspecified, uncomplicated: Secondary | ICD-10-CM | POA: Diagnosis not present

## 2022-10-27 DIAGNOSIS — R9389 Abnormal findings on diagnostic imaging of other specified body structures: Secondary | ICD-10-CM

## 2022-10-27 DIAGNOSIS — J439 Emphysema, unspecified: Secondary | ICD-10-CM | POA: Diagnosis not present

## 2022-10-27 DIAGNOSIS — M792 Neuralgia and neuritis, unspecified: Secondary | ICD-10-CM | POA: Diagnosis not present

## 2022-10-27 DIAGNOSIS — K219 Gastro-esophageal reflux disease without esophagitis: Secondary | ICD-10-CM | POA: Insufficient documentation

## 2022-10-27 DIAGNOSIS — F17210 Nicotine dependence, cigarettes, uncomplicated: Secondary | ICD-10-CM | POA: Diagnosis not present

## 2022-10-27 DIAGNOSIS — Z1152 Encounter for screening for COVID-19: Secondary | ICD-10-CM | POA: Diagnosis not present

## 2022-10-27 DIAGNOSIS — M48062 Spinal stenosis, lumbar region with neurogenic claudication: Secondary | ICD-10-CM | POA: Diagnosis not present

## 2022-10-27 DIAGNOSIS — K860 Alcohol-induced chronic pancreatitis: Secondary | ICD-10-CM | POA: Insufficient documentation

## 2022-10-27 DIAGNOSIS — R911 Solitary pulmonary nodule: Secondary | ICD-10-CM

## 2022-10-27 HISTORY — PX: BRONCHIAL NEEDLE ASPIRATION BIOPSY: SHX5106

## 2022-10-27 HISTORY — PX: BRONCHIAL WASHINGS: SHX5105

## 2022-10-27 HISTORY — PX: VIDEO BRONCHOSCOPY WITH ENDOBRONCHIAL ULTRASOUND: SHX6177

## 2022-10-27 LAB — COMPREHENSIVE METABOLIC PANEL
ALT: 10 U/L (ref 0–44)
AST: 15 U/L (ref 15–41)
Albumin: 2.8 g/dL — ABNORMAL LOW (ref 3.5–5.0)
Alkaline Phosphatase: 63 U/L (ref 38–126)
Anion gap: 11 (ref 5–15)
BUN: 6 mg/dL (ref 6–20)
CO2: 26 mmol/L (ref 22–32)
Calcium: 9.1 mg/dL (ref 8.9–10.3)
Chloride: 102 mmol/L (ref 98–111)
Creatinine, Ser: 1 mg/dL (ref 0.61–1.24)
GFR, Estimated: >60 mL/min (ref >60)
Glucose, Bld: 96 mg/dL (ref 70–99)
Potassium: 3.6 mmol/L (ref 3.5–5.1)
Sodium: 139 mmol/L (ref 135–145)
Total Bilirubin: 0.4 mg/dL (ref 0.3–1.2)
Total Protein: 6.7 g/dL (ref 6.5–8.1)

## 2022-10-27 LAB — CBC
HCT: 38.9 % — ABNORMAL LOW (ref 39.0–52.0)
Hemoglobin: 13.3 g/dL (ref 13.0–17.0)
MCH: 29.9 pg (ref 26.0–34.0)
MCHC: 34.2 g/dL (ref 30.0–36.0)
MCV: 87.4 fL (ref 80.0–100.0)
Platelets: 422 10*3/uL — ABNORMAL HIGH (ref 150–400)
RBC: 4.45 MIL/uL (ref 4.22–5.81)
RDW: 16.1 % — ABNORMAL HIGH (ref 11.5–15.5)
WBC: 10.9 10*3/uL — ABNORMAL HIGH (ref 4.0–10.5)
nRBC: 0 % (ref 0.0–0.2)

## 2022-10-27 LAB — SARS CORONAVIRUS 2 BY RT PCR: SARS Coronavirus 2 by RT PCR: NEGATIVE

## 2022-10-27 SURGERY — BRONCHOSCOPY, WITH EBUS
Anesthesia: General

## 2022-10-27 MED ORDER — AMISULPRIDE (ANTIEMETIC) 5 MG/2ML IV SOLN
INTRAVENOUS | Status: AC
  Filled 2022-10-27: qty 4

## 2022-10-27 MED ORDER — LIDOCAINE 5 % EX PTCH: 1.0000 | MEDICATED_PATCH | CUTANEOUS | Status: DC

## 2022-10-27 MED ORDER — AMISULPRIDE (ANTIEMETIC) 5 MG/2ML IV SOLN
10.0000 mg | Freq: Once | INTRAVENOUS | Status: AC
  Administered 2022-10-27: 10 mg via INTRAVENOUS

## 2022-10-27 MED ORDER — MIDAZOLAM HCL 5 MG/5ML IJ SOLN
INTRAMUSCULAR | Status: DC | PRN
  Administered 2022-10-27: 2 mg via INTRAVENOUS

## 2022-10-27 MED ORDER — ROCURONIUM BROMIDE 10 MG/ML (PF) SYRINGE
PREFILLED_SYRINGE | INTRAVENOUS | Status: DC | PRN
  Administered 2022-10-27: 70 mg via INTRAVENOUS

## 2022-10-27 MED ORDER — DEXAMETHASONE SODIUM PHOSPHATE 10 MG/ML IJ SOLN
INTRAMUSCULAR | Status: DC | PRN
  Administered 2022-10-27: 10 mg via INTRAVENOUS

## 2022-10-27 MED ORDER — PROPOFOL 10 MG/ML IV BOLUS
INTRAVENOUS | Status: DC | PRN
  Administered 2022-10-27: 120 mg via INTRAVENOUS

## 2022-10-27 MED ORDER — ONDANSETRON HCL 4 MG/2ML IJ SOLN
INTRAMUSCULAR | Status: DC | PRN
  Administered 2022-10-27: 4 mg via INTRAVENOUS

## 2022-10-27 MED ORDER — LACTATED RINGERS IV SOLN: INTRAVENOUS | Status: DC

## 2022-10-27 MED ORDER — LIDOCAINE 2% (20 MG/ML) 5 ML SYRINGE
INTRAMUSCULAR | Status: DC | PRN
  Administered 2022-10-27: 60 mg via INTRAVENOUS

## 2022-10-27 MED ORDER — SUGAMMADEX SODIUM 200 MG/2ML IV SOLN
INTRAVENOUS | Status: DC | PRN
  Administered 2022-10-27: 200 mg via INTRAVENOUS

## 2022-10-27 MED ORDER — PHENYLEPHRINE HCL-NACL 20-0.9 MG/250ML-% IV SOLN
INTRAVENOUS | Status: DC | PRN
  Administered 2022-10-27: 20 ug/min via INTRAVENOUS

## 2022-10-27 MED ORDER — CHLORHEXIDINE GLUCONATE 0.12 % MT SOLN
15.0000 mL | Freq: Once | OROMUCOSAL | Status: AC
  Administered 2022-10-27: 15 mL via OROMUCOSAL
  Filled 2022-10-27 (×2): qty 15

## 2022-10-27 MED ORDER — FENTANYL CITRATE (PF) 250 MCG/5ML IJ SOLN
INTRAMUSCULAR | Status: DC | PRN
  Administered 2022-10-27 (×2): 50 ug via INTRAVENOUS

## 2022-10-27 NOTE — Discharge Instructions (Signed)
Flexible Bronchoscopy, Care After This sheet gives you information about how to care for yourself after your test. Your doctor may also give you more specific instructions. If you have problems or questions, contact your doctor. Follow these instructions at home: Eating and drinking When your numbness is gone and your cough and gag reflexes have come back, you may: Eat only soft foods. Slowly drink liquids. The [place] after the test, go back to your normal diet. Driving Do not drive for 24 hours if you were given a medicine to help you relax (sedative). Do not drive or use heavy machinery while taking prescription pain medicine. General instructions  Take over-the-counter and prescription medicines only as told by your doctor. Return to your normal activities as told. Ask what activities are safe for you. Do not use any products that have nicotine or tobacco in them. This includes cigarettes and e-cigarettes. If you need help quitting, ask your doctor. Keep all follow-up visits as told by your doctor. This is important. It is very important if you had a tissue sample (biopsy) taken. Get help right away if: You have shortness of breath that gets worse. You get light-headed. You feel like you are going to pass out (faint). You have chest pain. You cough up: More than a little blood. More blood than before. Summary Do not eat or drink anything (not even water) for 2 hours after your test, or until your numbing medicine wears off. Do not use cigarettes. Do not use e-cigarettes. Get help right away if you have chest pain.  Please call our office for any questions or concerns.  [phone].  This information is not intended to replace advice given to you by your health care provider. Make sure you discuss any questions you have with your health care provider. Document Released: 09/14/2009 Document Revised: 10/30/2017 Document Reviewed: 12/05/2016 Elsevier Patient Education  2020 [employer].

## 2022-10-27 NOTE — Anesthesia Postprocedure Evaluation (Signed)
Anesthesia Post Note  Patient: Adrien N Longenecker  Procedure(s) Performed: VIDEO BRONCHOSCOPY WITH ENDOBRONCHIAL ULTRASOUND BRONCHIAL WASHINGS BRONCHIAL NEEDLE ASPIRATION BIOPSIES     Patient location during evaluation: PACU Anesthesia Type: General Level of consciousness: awake and alert Pain management: pain level controlled Vital Signs Assessment: post-procedure vital signs reviewed and stable Respiratory status: spontaneous breathing, nonlabored ventilation, respiratory function stable and patient connected to nasal cannula oxygen Cardiovascular status: blood pressure returned to baseline and stable Postop Assessment: no apparent nausea or vomiting Anesthetic complications: no  No notable events documented.  Last Vitals:  Vitals:   10/27/22 1145 10/27/22 1200  BP: 125/82 (!) 140/85  Pulse: 83 81  Resp: 14 18  Temp:    SpO2: 100% 98%    Last Pain:  Vitals:   10/27/22 1134  TempSrc:   PainSc: Asleep                 Rance Smithson L Tamyka Bezio

## 2022-10-27 NOTE — Op Note (Signed)
Video Bronchoscopy with Endobronchial Ultrasound Procedure Note  Date of Operation: 10/27/2022  Pre-op Diagnosis: Mediastinal adenopathy, right upper lobe nodule  Post-op Diagnosis: Same  Surgeon: Baltazar Apo  Assistants: None  Anesthesia: General endotracheal anesthesia  Operation: Flexible video fiberoptic bronchoscopy with endobronchial ultrasound and biopsies.  Estimated Blood Loss: Minimal  Complications: None apparent  Indications and History: Wesley [name] is a 56 y.o. male with history of tobacco use.  He was found to have a right upper lobe pulmonary nodule.  Associated emphysema as well as mediastinal adenopathy.  The right upper lobe nodule is not accessible by navigational bronchoscopy.  Recommendation made to pursue nodal biopsies via endobronchial ultrasound.  The risks, benefits, complications, treatment options and expected outcomes were discussed with the patient.  The possibilities of pneumothorax, pneumonia, reaction to medication, pulmonary aspiration, perforation of a viscus, bleeding, failure to diagnose a condition and creating a complication requiring transfusion or operation were discussed with the patient who freely signed the consent.    Description of Procedure: The patient was examined in the preoperative area and history and data from the preprocedure consultation were reviewed. It was deemed appropriate to proceed.  The patient was taken to [facility] endoscopy room 3, identified as Wesley [name] and the procedure verified as Flexible Video Fiberoptic Bronchoscopy.  A Time Out was held and the above information confirmed. After being taken to the operating room general anesthesia was initiated and the patient  was orally intubated. The video fiberoptic bronchoscope was introduced via the endotracheal tube and a general inspection was performed which showed normal airways throughout.  Bronchoalveolar lavage with 60 cc normal saline and approximate 20 cc returned  was performed in the apical segment of the right upper lobe for cytology.  The standard scope was then withdrawn and the endobronchial ultrasound was used to identify and characterize the peritracheal, hilar and bronchial lymph nodes. Inspection showed enlarged nodes at station 4L, 7, 4R. Using real-time ultrasound guidance Wang needle biopsies were take from Station 4L, 7, 4R nodes and were sent for cytology. The patient tolerated the procedure well without apparent complications. There was no significant blood loss. The bronchoscope was withdrawn. Anesthesia was reversed and the patient was taken to the PACU for recovery.   Samples: 1. Wang needle biopsies from 4L node 2. Wang needle biopsies from 7 node 3. Wang needle biopsies from 4R node 4.  Bronchoalveolar lavage from apical segment of the right upper lobe for cytology  Plans:  The patient will be discharged from the PACU to home when recovered from anesthesia. We will review the cytology, pathology and microbiology results with the patient when they become available. Outpatient followup will be with Dr. Lamonte Sakai.    Baltazar Apo, MD, PhD 10/27/2022, 11:20 AM [facility] [phone] or if no answer before 7:00PM call [phone] For any issues after 7:00PM please call eLink [phone]

## 2022-10-27 NOTE — Anesthesia Procedure Notes (Signed)
Procedure Name: Intubation Date/Time: 10/27/2022 10:14 AM  Performed by: Kyung Rudd, CRNAPre-anesthesia Checklist: Patient identified, Emergency Drugs available, Suction available and Patient being monitored Patient Re-evaluated:Patient Re-evaluated prior to induction Oxygen Delivery Method: Circle system utilized Preoxygenation: Pre-oxygenation with 100% oxygen Induction Type: IV induction Ventilation: Mask ventilation without difficulty Laryngoscope Size: Mac and 4 Grade View: Grade II Tube type: Oral Tube size: 8.5 mm Number of attempts: 1 Airway Equipment and Method: Stylet Placement Confirmation: ETT inserted through vocal cords under direct vision, positive ETCO2 and breath sounds checked- equal and bilateral Secured at: 22 cm Tube secured with: Tape Dental Injury: Teeth and Oropharynx as per pre-operative assessment

## 2022-10-27 NOTE — Transfer of Care (Signed)
Immediate Anesthesia Transfer of Care Note  Patient: Wesley [name]  Procedure(s) Performed: VIDEO BRONCHOSCOPY WITH ENDOBRONCHIAL ULTRASOUND BRONCHIAL WASHINGS BRONCHIAL NEEDLE ASPIRATION BIOPSIES  Patient Location: PACU  Anesthesia Type:General  Level of Consciousness: awake and alert   Airway & Oxygen Therapy: Patient Spontanous Breathing and Patient connected to nasal cannula oxygen  Post-op Assessment: Report given to RN and Post -op Vital signs reviewed and stable  Post vital signs: Reviewed and stable  Last Vitals:  Vitals Value Taken Time  BP 125/82 10/27/22 1145  Temp 36.7 C 10/27/22 1134  Pulse 88 10/27/22 1148  Resp 18 10/27/22 1148  SpO2 100 % 10/27/22 1148  Vitals shown include unvalidated device data.  Last Pain:  Vitals:   10/27/22 1134  TempSrc:   PainSc: Asleep      Patients Stated Pain Goal: 0 (91/47/82 [id number])  Complications: No notable events documented.

## 2022-10-27 NOTE — Interval H&P Note (Signed)
History and Physical Interval Note:  10/27/2022 10:03 AM  Wesley [name]  has presented today for surgery, with the diagnosis of mediastinal adenopathy.  The various methods of treatment have been discussed with the patient and family. After consideration of risks, benefits and other options for treatment, the patient has consented to  Procedure(s): Ebony (N/A) as a surgical intervention.  The patient's history has been reviewed, patient examined, no change in status, stable for surgery.  I have reviewed the patient's chart and labs.  Questions were answered to the patient's satisfaction.     Collene Gobble

## 2022-10-29 ENCOUNTER — Ambulatory Visit: Payer: Medicare Other | Admitting: Critical Care Medicine

## 2022-10-29 ENCOUNTER — Encounter (HOSPITAL_COMMUNITY): Payer: Self-pay | Admitting: Emergency Medicine

## 2022-10-29 LAB — CYTOLOGY - NON PAP

## 2022-10-30 ENCOUNTER — Other Ambulatory Visit (HOSPITAL_COMMUNITY): Payer: Self-pay | Admitting: Physician Assistant

## 2022-10-30 ENCOUNTER — Other Ambulatory Visit: Payer: Self-pay | Admitting: Radiology

## 2022-10-30 ENCOUNTER — Other Ambulatory Visit: Payer: Self-pay | Admitting: Student

## 2022-10-30 DIAGNOSIS — R911 Solitary pulmonary nodule: Secondary | ICD-10-CM

## 2022-10-31 ENCOUNTER — Encounter (HOSPITAL_COMMUNITY): Payer: Self-pay

## 2022-10-31 ENCOUNTER — Ambulatory Visit (HOSPITAL_COMMUNITY)
Admission: RE | Admit: 2022-10-31 | Discharge: 2022-10-31 | Disposition: A | Discharge status: Home / Self Care | Payer: Medicare Other | Source: Ambulatory Visit | Attending: Emergency Medicine | Admitting: Emergency Medicine

## 2022-10-31 ENCOUNTER — Ambulatory Visit (HOSPITAL_COMMUNITY)
Admission: RE | Admit: 2022-10-31 | Discharge: 2022-10-31 | Disposition: A | Discharge status: Home / Self Care | Payer: Medicare Other | Source: Ambulatory Visit | Attending: Diagnostic Radiology | Admitting: Diagnostic Radiology

## 2022-10-31 ENCOUNTER — Other Ambulatory Visit: Payer: Self-pay

## 2022-10-31 DIAGNOSIS — I898 Other specified noninfective disorders of lymphatic vessels and lymph nodes: Secondary | ICD-10-CM | POA: Diagnosis not present

## 2022-10-31 DIAGNOSIS — J439 Emphysema, unspecified: Secondary | ICD-10-CM | POA: Diagnosis not present

## 2022-10-31 DIAGNOSIS — J9811 Atelectasis: Secondary | ICD-10-CM | POA: Diagnosis not present

## 2022-10-31 DIAGNOSIS — R911 Solitary pulmonary nodule: Secondary | ICD-10-CM | POA: Insufficient documentation

## 2022-10-31 DIAGNOSIS — F17210 Nicotine dependence, cigarettes, uncomplicated: Secondary | ICD-10-CM | POA: Insufficient documentation

## 2022-10-31 DIAGNOSIS — R590 Localized enlarged lymph nodes: Secondary | ICD-10-CM | POA: Insufficient documentation

## 2022-10-31 LAB — PROTIME-INR
INR: 1.1 (ref 0.8–1.2)
Prothrombin Time: 13.8 seconds (ref 11.4–15.2)

## 2022-10-31 LAB — CBC
HCT: 38.9 % — ABNORMAL LOW (ref 39.0–52.0)
Hemoglobin: 13.4 g/dL (ref 13.0–17.0)
MCH: 29.6 pg (ref 26.0–34.0)
MCHC: 34.4 g/dL (ref 30.0–36.0)
MCV: 85.9 fL (ref 80.0–100.0)
Platelets: 422 10*3/uL — ABNORMAL HIGH (ref 150–400)
RBC: 4.53 MIL/uL (ref 4.22–5.81)
RDW: 16.8 % — ABNORMAL HIGH (ref 11.5–15.5)
WBC: 12 10*3/uL — ABNORMAL HIGH (ref 4.0–10.5)
nRBC: 0 % (ref 0.0–0.2)

## 2022-10-31 MED ORDER — FENTANYL CITRATE (PF) 100 MCG/2ML IJ SOLN
INTRAMUSCULAR | Status: AC
  Filled 2022-10-31: qty 4

## 2022-10-31 MED ORDER — SODIUM CHLORIDE 0.9 % IV SOLN: INTRAVENOUS | Status: DC

## 2022-10-31 MED ORDER — MIDAZOLAM HCL 2 MG/2ML IJ SOLN
INTRAMUSCULAR | Status: AC | PRN
  Administered 2022-10-31 (×2): 1 mg via INTRAVENOUS

## 2022-10-31 MED ORDER — MIDAZOLAM HCL 2 MG/2ML IJ SOLN
INTRAMUSCULAR | Status: AC
  Filled 2022-10-31: qty 4

## 2022-10-31 MED ORDER — FENTANYL CITRATE (PF) 100 MCG/2ML IJ SOLN
INTRAMUSCULAR | Status: AC | PRN
  Administered 2022-10-31 (×2): 50 ug via INTRAVENOUS
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
## 2022-10-31 NOTE — Procedures (Signed)
Interventional Radiology Procedure:   Indications: Suspicious right lung nodule and suspicious left internal mammary lymph node  Procedure: CT and US guided core biopsy of left internal mammary node.  Findings: Small 18 gauge core biopsies obtained. Negative for pneumothorax.   Complications: No immediate complications noted.     EBL: Minimal  Plan: CXR in 1 hour, anticipate discharge in 2 hours   Seraphine Gudiel R. Anselm Pancoast, MD  Pager: [phone]

## 2022-11-03 ENCOUNTER — Telehealth: Payer: Self-pay | Admitting: Emergency Medicine

## 2022-11-04 LAB — SURGICAL PATHOLOGY

## 2022-11-04 NOTE — Telephone Encounter (Signed)
See results note from 10/27/22

## 2022-11-07 ENCOUNTER — Telehealth: Payer: Self-pay | Admitting: Emergency Medicine

## 2022-11-07 DIAGNOSIS — R911 Solitary pulmonary nodule: Secondary | ICD-10-CM

## 2022-11-07 NOTE — Telephone Encounter (Signed)
Called the patient to review his lymph node biopsy results.  No answer, left him a voicemail.  Cytology was negative for malignancy.  I reviewed his case in thoracic oncology conference.  Consensus was that it would be reasonable to refer him to radiation oncology for empiric treatment given suspicion regarding the right upper lobe pulmonary nodule.  I will call him back to discuss and see if he is willing to see radiation oncology.
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
## 2022-11-10 NOTE — Telephone Encounter (Signed)
Appt has been cancelled.

## 2022-11-10 NOTE — Telephone Encounter (Signed)
Discussed needle bx results w patient. Also discussed the Thoracic Conference recs regarding referral to radiation oncology for empiric SBRT.  He agrees with this plan.  I will make the referral today.  Please cancel his existing OV with me scheduled for 12/20.

## 2022-11-12 ENCOUNTER — Encounter: Payer: Self-pay | Admitting: Physician Assistant

## 2022-11-12 ENCOUNTER — Other Ambulatory Visit (INDEPENDENT_AMBULATORY_CARE_PROVIDER_SITE_OTHER): Payer: Medicare Other

## 2022-11-12 ENCOUNTER — Ambulatory Visit: Payer: Medicare Other | Admitting: Physician Assistant

## 2022-11-12 VITALS — BP 136/66 | HR 110 | Ht 68.0 in | Wt 159.4 lb

## 2022-11-12 DIAGNOSIS — R1319 Other dysphagia: Secondary | ICD-10-CM

## 2022-11-12 DIAGNOSIS — C3411 Malignant neoplasm of upper lobe, right bronchus or lung: Secondary | ICD-10-CM

## 2022-11-12 DIAGNOSIS — K863 Pseudocyst of pancreas: Secondary | ICD-10-CM

## 2022-11-12 DIAGNOSIS — F17200 Nicotine dependence, unspecified, uncomplicated: Secondary | ICD-10-CM

## 2022-11-12 DIAGNOSIS — I739 Peripheral vascular disease, unspecified: Secondary | ICD-10-CM | POA: Diagnosis not present

## 2022-11-12 DIAGNOSIS — R110 Nausea: Secondary | ICD-10-CM

## 2022-11-12 DIAGNOSIS — R079 Chest pain, unspecified: Secondary | ICD-10-CM

## 2022-11-12 DIAGNOSIS — K860 Alcohol-induced chronic pancreatitis: Secondary | ICD-10-CM

## 2022-11-12 LAB — HEPATIC FUNCTION PANEL
ALT: 10 U/L (ref 0–53)
AST: 15 U/L (ref 0–37)
Albumin: 4 g/dL (ref 3.5–5.2)
Alkaline Phosphatase: 85 U/L (ref 39–117)
Bilirubin, Direct: 0.2 mg/dL (ref 0.0–0.3)
Total Bilirubin: 0.7 mg/dL (ref 0.2–1.2)
Total Protein: 8.1 g/dL (ref 6.0–8.3)

## 2022-11-12 LAB — BASIC METABOLIC PANEL
BUN: 6 mg/dL (ref 6–23)
CO2: 27 mEq/L (ref 19–32)
Calcium: 10.1 mg/dL (ref 8.4–10.5)
Chloride: 102 mEq/L (ref 96–112)
Creatinine, Ser: 0.85 mg/dL (ref 0.40–1.50)
GFR: 96.31 mL/min (ref >60.00)
Glucose, Bld: 107 mg/dL — ABNORMAL HIGH (ref 70–99)
Potassium: 3.9 mEq/L (ref 3.5–5.1)
Sodium: 139 mEq/L (ref 135–145)

## 2022-11-12 LAB — LIPASE: Lipase: 20 U/L (ref 11.0–59.0)

## 2022-11-12 LAB — CBC WITH DIFFERENTIAL/PLATELET
Basophils Absolute: 0.1 10*3/uL (ref 0.0–0.1)
Basophils Relative: 0.9 % (ref 0.0–3.0)
Eosinophils Absolute: 0.1 10*3/uL (ref 0.0–0.7)
Eosinophils Relative: 1.2 % (ref 0.0–5.0)
HCT: 43.7 % (ref 39.0–52.0)
Hemoglobin: 14.8 g/dL (ref 13.0–17.0)
Lymphocytes Relative: 19.7 % (ref 12.0–46.0)
Lymphs Abs: 2.3 10*3/uL (ref 0.7–4.0)
MCHC: 33.9 g/dL (ref 30.0–36.0)
MCV: 87.1 fl (ref 78.0–100.0)
Monocytes Absolute: 0.9 10*3/uL (ref 0.1–1.0)
Monocytes Relative: 7.7 % (ref 3.0–12.0)
Neutro Abs: 8.1 10*3/uL — ABNORMAL HIGH (ref 1.4–7.7)
Neutrophils Relative %: 70.5 % (ref 43.0–77.0)
Platelets: 325 10*3/uL (ref 150.0–400.0)
RBC: 5.02 Mil/uL (ref 4.22–5.81)
RDW: 18.5 % — ABNORMAL HIGH (ref 11.5–15.5)
WBC: 11.4 10*3/uL — ABNORMAL HIGH (ref 4.0–10.5)

## 2022-11-12 LAB — AMYLASE: Amylase: 43 U/L (ref 27–131)

## 2022-11-12 MED ORDER — ONDANSETRON HCL 4 MG PO TABS: 4.0000 mg | ORAL_TABLET | Freq: Three times a day (TID) | ORAL | 1 refills | Status: DC | PRN

## 2022-11-12 NOTE — Patient Instructions (Addendum)
Your provider has requested that you go to the basement level for lab work before leaving today. Press "B" on the elevator. The lab is located at the first door on the left as you exit the elevator.  Try salon pas patches on your chest for possible muscle pain  Go to the ER if you have any severe weakness, severe AB pain, vomit blood, dark red blood in your bowel movement, shortenss of breath or chest pain worse, palpitations or fast heart beat, weakness.   SMOKING CESSATION  [employer]  4754120089 for more information or for a free program for smoking cessation help.   You can call QUIT SMART 1-800-QUIT-NOW for free nicotine patches or replacement therapy- if they are out- keep calling  [facility] [facility] Can call for smoking cessation classes, [phone]  If you have a smart phone, please look up Smoke Free app, this will help you stay on track and give you information about money you have saved, life that you have gained back and a ton of more information.     ADVANTAGES OF QUITTING SMOKING Within 20 minutes, blood pressure decreases. Your pulse is at normal level. After 8 hours, carbon monoxide levels in the blood return to normal. Your oxygen level increases. After 24 hours, the chance of having a heart attack starts to decrease. Your breath, hair, and body stop smelling like smoke. After 48 hours, damaged nerve endings begin to recover. Your sense of taste and smell improve. After 72 hours, the body is virtually free of nicotine. Your bronchial tubes relax and breathing becomes easier. After 2 to 12 weeks, lungs can hold more air. Exercise becomes easier and circulation improves. After 1 year, the risk of coronary heart disease is cut in half. After 5 years, the risk of stroke falls to the same as a nonsmoker. After 10 years, the risk of lung cancer is cut in half and the risk of other cancers decreases significantly. After 15 years, the risk of coronary  heart disease drops, usually to the level of a nonsmoker. You will have extra money to spend on things other than cigarettes.   It was a pleasure to see you today!  Thank you for trusting me with your gastrointestinal care!

## 2022-11-13 ENCOUNTER — Other Ambulatory Visit: Payer: Self-pay

## 2022-11-13 DIAGNOSIS — R079 Chest pain, unspecified: Secondary | ICD-10-CM

## 2022-11-13 NOTE — Progress Notes (Signed)
Thoracic Location of Tumor / Histology:  Malignant neoplasm of right upper lobe  Patient presented with symptoms of: per Dr. Agustina Caroli H&P: patient " was hospitalized between 9/27 and 09/03/2022.  During that hospitalization he had an incidentally observed right upper lobe pulmonary nodule that needed further follow-up."  PET Scan  10/01/2022 --IMPRESSION: 12 mm posterior right upper lobe pulmonary nodule of concern on recent chest CT is markedly hypermetabolic, consistent with primary bronchogenic neoplasm. Hypermetabolic lymph nodes in the right neck, mediastinum, and left internal mammary chain, raising concern for metastatic disease. No evidence for hypermetabolism associated with the pancreatic tail fullness described on previous CT imaging. Areas of nodular hypermetabolism around the low-density splenic lesion, indeterminate. Attention on follow-up recommended. Bilateral groin hernias contain only fat. Aortic Atherosclerosis (ICD10-I70.0).  Biopsies revealed:  10/31/2022 FINAL MICROSCOPIC DIAGNOSIS:  A. LYMPH NODE, LEFT INTERNAL MAMMARY, NEEDLE CORE BIOPSY:  -  Scant fragment of crushed lymphoid tissue.  -  No morphologic or immunohistochemical evidence of malignancy on limited material.  Note: The specimen consists of a few scant fragments of lymphoid tissue which are predominantly crushed.  An immunohistochemical stain is performed (pankeratin) and is negative.   10/27/2022 FINAL MICROSCOPIC DIAGNOSIS: A. LUNG, RUL, LAVAGE:   - No malignant cells identified  B. LYMPH NODE, 4L, FINE NEEDLE ASPIRATION:  - No malignant cells identified  C. LYMPH NODE, STATION 7, FINE NEEDLE ASPIRATION:  - No malignant cells identified  D. LYMPH NODE, 4R, FINE NEEDLE ASPIRATION:  - No malignant cells identified   Tobacco/Marijuana/Snuff/ETOH use: Patient is s current every [place] smoker (~0.75 pack/[place]). Denies any current alcohol use (stopped drinking 04/2019). Denies any vaping or recreational drug  use  Past/Anticipated interventions by cardiothoracic surgery, if any:  10/31/2022 --Dr. Markus Daft CT guided biopsy of the left internal mammary lymph node    10/27/2022 --Dr. Baltazar Apo Flexible video fiberoptic bronchoscopy with endobronchial ultrasound and biopsies   Past/Anticipated interventions by medical oncology, if any:  No referral placed at this time  Signs/Symptoms Weight changes, if any:  No Respiratory complaints, if any: SOB, nonproductive cough. Hemoptysis, if any: No Pain issues, if any:  No  SAFETY ISSUES: Prior radiation? No Pacemaker/ICD? No  Possible current pregnancy? N/A Is the patient on methotrexate? No  Current Complaints / other details:

## 2022-11-14 ENCOUNTER — Other Ambulatory Visit: Payer: Self-pay
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
## 2022-11-18 NOTE — Progress Notes (Signed)
Agree with the assessment and plan as outlined by [name], PA-C.  Planning SBRT for pulmonary lesion. HE had been following with [facility] service for the chronic pancreatitis and surveillance. If that is no longer the case, then will plan for repeat pancreas imaging in the next 3 months or so, along with continued CA 19-9 trend. If he plans to f/u wih [facility], then will defer pancreas surveillance to the center of excellence.    Iyanni Hepp, DO, [facility]

## 2022-11-19 ENCOUNTER — Ambulatory Visit: Payer: Medicare Other | Admitting: Emergency Medicine

## 2022-11-19 ENCOUNTER — Ambulatory Visit
Admission: RE | Admit: 2022-11-19 | Discharge: 2022-11-19 | Disposition: A | Discharge status: Home / Self Care | Payer: Medicare Other | Source: Ambulatory Visit | Attending: Radiation Oncology | Admitting: Radiation Oncology

## 2022-11-19 ENCOUNTER — Other Ambulatory Visit: Payer: Self-pay

## 2022-11-19 ENCOUNTER — Other Ambulatory Visit: Payer: Medicare Other

## 2022-11-19 ENCOUNTER — Other Ambulatory Visit: Payer: Self-pay | Admitting: Physician Assistant

## 2022-11-19 VITALS — BP 122/86 | HR 112 | Temp 96.3°F | Resp 18 | Ht 68.0 in | Wt 155.4 lb

## 2022-11-19 DIAGNOSIS — Z800 Family history of malignant neoplasm of digestive organs: Secondary | ICD-10-CM | POA: Insufficient documentation

## 2022-11-19 DIAGNOSIS — Z7951 Long term (current) use of inhaled steroids: Secondary | ICD-10-CM | POA: Insufficient documentation

## 2022-11-19 DIAGNOSIS — B182 Chronic viral hepatitis C: Secondary | ICD-10-CM | POA: Insufficient documentation

## 2022-11-19 DIAGNOSIS — J439 Emphysema, unspecified: Secondary | ICD-10-CM | POA: Diagnosis not present

## 2022-11-19 DIAGNOSIS — K859 Acute pancreatitis without necrosis or infection, unspecified: Secondary | ICD-10-CM | POA: Diagnosis not present

## 2022-11-19 DIAGNOSIS — Z803 Family history of malignant neoplasm of breast: Secondary | ICD-10-CM | POA: Diagnosis not present

## 2022-11-19 DIAGNOSIS — C3411 Malignant neoplasm of upper lobe, right bronchus or lung: Secondary | ICD-10-CM | POA: Diagnosis not present

## 2022-11-19 DIAGNOSIS — J90 Pleural effusion, not elsewhere classified: Secondary | ICD-10-CM | POA: Insufficient documentation

## 2022-11-19 DIAGNOSIS — K219 Gastro-esophageal reflux disease without esophagitis: Secondary | ICD-10-CM | POA: Insufficient documentation

## 2022-11-19 DIAGNOSIS — R079 Chest pain, unspecified: Secondary | ICD-10-CM | POA: Diagnosis not present

## 2022-11-19 DIAGNOSIS — K869 Disease of pancreas, unspecified: Secondary | ICD-10-CM | POA: Diagnosis not present

## 2022-11-19 DIAGNOSIS — R590 Localized enlarged lymph nodes: Secondary | ICD-10-CM

## 2022-11-19 DIAGNOSIS — D378 Neoplasm of uncertain behavior of other specified digestive organs: Secondary | ICD-10-CM | POA: Diagnosis not present

## 2022-11-19 DIAGNOSIS — F17210 Nicotine dependence, cigarettes, uncomplicated: Secondary | ICD-10-CM | POA: Insufficient documentation

## 2022-11-19 DIAGNOSIS — Z79899 Other long term (current) drug therapy: Secondary | ICD-10-CM | POA: Diagnosis not present

## 2022-11-19 MED ORDER — AMITRIPTYLINE HCL 50 MG PO TABS: 50.0000 mg | ORAL_TABLET | Freq: Every day | ORAL | 3 refills | Status: DC

## 2022-11-19 NOTE — Progress Notes (Signed)
Called patient.  He states [facility] is too far to follow up with.  Will continue follow up here, has OV 01/30/23.  Will plan on repeating image at that time.  Refill elavil for patient.  Future Appointments  Date Time Provider [place]  11/26/2022 11:00 AM Gery Pray, MD [facility] None  01/30/2023  8:40 AM Cirigliano, Dominic Pea, DO [facility]

## 2022-11-20 LAB — CANCER ANTIGEN 19-9: CA 19-9: 36 U/mL — ABNORMAL HIGH (ref <34)

## 2022-11-22 DIAGNOSIS — F17210 Nicotine dependence, cigarettes, uncomplicated: Secondary | ICD-10-CM | POA: Diagnosis not present

## 2022-11-22 DIAGNOSIS — Z6824 Body mass index (BMI) 24.0-24.9, adult: Secondary | ICD-10-CM | POA: Diagnosis not present

## 2022-11-22 DIAGNOSIS — Z79891 Long term (current) use of opiate analgesic: Secondary | ICD-10-CM | POA: Diagnosis not present

## 2022-11-22 DIAGNOSIS — R1013 Epigastric pain: Secondary | ICD-10-CM | POA: Diagnosis not present

## 2022-11-22 DIAGNOSIS — M545 Low back pain, unspecified: Secondary | ICD-10-CM | POA: Diagnosis not present

## 2022-11-22 DIAGNOSIS — G8929 Other chronic pain: Secondary | ICD-10-CM | POA: Diagnosis not present

## 2022-11-24 ENCOUNTER — Other Ambulatory Visit: Payer: Self-pay | Admitting: Critical Care Medicine

## 2022-11-25 NOTE — Progress Notes (Signed)
Pre procedure protocol

## 2022-11-26 ENCOUNTER — Other Ambulatory Visit: Payer: Self-pay

## 2022-11-26 ENCOUNTER — Ambulatory Visit
Admission: RE | Admit: 2022-11-26 | Discharge: 2022-11-26 | Disposition: A | Discharge status: Home / Self Care | Payer: Medicare Other | Source: Ambulatory Visit | Attending: Radiation Oncology | Admitting: Radiation Oncology

## 2022-11-26 DIAGNOSIS — C3411 Malignant neoplasm of upper lobe, right bronchus or lung: Secondary | ICD-10-CM | POA: Insufficient documentation

## 2022-11-26 DIAGNOSIS — F17210 Nicotine dependence, cigarettes, uncomplicated: Secondary | ICD-10-CM | POA: Diagnosis not present

## 2022-12-02 ENCOUNTER — Telehealth: Payer: Self-pay | Admitting: Radiation Oncology

## 2022-12-02 ENCOUNTER — Ambulatory Visit: Payer: Medicare Other | Admitting: Radiation Oncology

## 2022-12-02 DIAGNOSIS — C3411 Malignant neoplasm of upper lobe, right bronchus or lung: Secondary | ICD-10-CM

## 2022-12-02 NOTE — Telephone Encounter (Signed)
This evening I spoke with Wesley [name].  He underwent SBRT planning several days ago.  Despite extensive evaluation we were unable to adequately see his nodule in the right upper lobe.  We therefore cannot adequately treat him with SBRT at this time.  Recommended we repeat a chest CT scan in 3 months for further evaluation.  He agrees.  He will be seen soon after his chest CT scan for further evaluation.

## 2022-12-03 ENCOUNTER — Ambulatory Visit: Payer: Medicare Other | Admitting: Radiation Oncology

## 2022-12-08 ENCOUNTER — Other Ambulatory Visit: Payer: Self-pay | Admitting: Critical Care Medicine

## 2022-12-08 ENCOUNTER — Ambulatory Visit: Payer: Medicare Other

## 2022-12-08 DIAGNOSIS — R110 Nausea: Secondary | ICD-10-CM

## 2022-12-09 NOTE — Telephone Encounter (Signed)
Requested Prescriptions  Pending Prescriptions Disp Refills   pantoprazole (PROTONIX) 40 MG tablet [Pharmacy Med Name: Pantoprazole Sodium 40 MG Oral Tablet Delayed Release] 200 tablet 2    Sig: TAKE 1 TABLET BY MOUTH TWICE  DAILY BEFORE A MEAL     Gastroenterology: Proton Pump Inhibitors Passed - 12/08/2022 10:05 PM      Passed - Valid encounter within last 12 months    Recent Outpatient Visits           3 months ago Hospital discharge follow-up   [place], Vernia Buff, NP   6 months ago COPD with chronic bronchitis [facility])   [place] [name], MD   8 months ago [place] [name], MD   8 months ago Strep throat   [place] [name], MD   1 year ago Neuropathic pain   [place] [name], MD

## 2022-12-10 ENCOUNTER — Ambulatory Visit: Payer: Medicare Other

## 2022-12-15 ENCOUNTER — Ambulatory Visit: Payer: Medicare Other

## 2022-12-16 ENCOUNTER — Other Ambulatory Visit: Payer: Self-pay | Admitting: Critical Care Medicine

## 2022-12-16 ENCOUNTER — Other Ambulatory Visit: Payer: Self-pay

## 2022-12-16 ENCOUNTER — Telehealth: Payer: Self-pay | Admitting: Student

## 2022-12-16 MED ORDER — FLUTICASONE FUROATE-VILANTEROL 200-25 MCG/ACT IN AEPB
1.0000 | INHALATION_SPRAY | Freq: Every day | RESPIRATORY_TRACT | 0 refills | Status: DC
  Filled 2022-12-16: qty 60, 30d supply, fill #0

## 2022-12-16 NOTE — Telephone Encounter (Signed)
Requested medication (s) are due for refill today: yes  Requested medication (s) are on the active medication list: yes  Last refill:  06/11/22 #60 each 2 refills  Future visit scheduled: no   Notes to clinic:  hospital f/u with Raul Del, NP medication last ordered by Dr. Joya Gaskins 06/11/22. Do you want to refill Rx?     Requested Prescriptions  Pending Prescriptions Disp Refills   fluticasone furoate-vilanterol (BREO ELLIPTA) 200-25 MCG/ACT AEPB 60 each 2    Sig: Inhale 1 puff into the lungs daily.     Pulmonology:  Combination Products Passed - 12/16/2022 12:36 PM      Passed - Valid encounter within last 12 months    Recent Outpatient Visits           3 months ago Hospital discharge follow-up   [place] Gildardo Pounds, NP   6 months ago COPD with chronic bronchitis [facility])   [place] [name], MD   8 months ago [place] [name], MD   9 months ago Strep throat   [place] [name], MD   1 year ago Neuropathic pain   [place] [name], MD

## 2022-12-16 NOTE — Telephone Encounter (Signed)
BREO ELLIPTA  fluticasone furoate-vilanterol fluticasone furoate-vilanterol (BREO ELLIPTA) 200-25 MCG/ACT AEPB   [facility]

## 2022-12-17 NOTE — Telephone Encounter (Signed)
Refilled yesterday.

## 2022-12-22 ENCOUNTER — Other Ambulatory Visit: Payer: Self-pay | Admitting: Critical Care Medicine

## 2022-12-23 NOTE — Telephone Encounter (Signed)
Requested medication (s) are due for refill today -no  Requested medication (s) are on the active medication list -yes  Future visit scheduled -no  Last refill: 03/20/22 #14 3RF  Notes to clinic: too soon, manual review requires- high dose medication   Requested Prescriptions  Pending Prescriptions Disp Refills   Vitamin D, Ergocalciferol, (DRISDOL) 1.25 MG (50000 UNIT) CAPS capsule [Pharmacy Med Name: Vitamin D (Ergocalciferol) 1.25 MG (50000 UT) Oral Capsule] 15 capsule 2    Sig: TAKE 1 CAPSULE BY MOUTH EVERY 7  DAYS     Endocrinology:  Vitamins - Vitamin D Supplementation 2 Failed - 12/22/2022 10:06 PM      Failed - Manual Review: Route requests for 50,000 IU strength to the provider      Failed - Vitamin D in normal range and within 360 days    VITD  Date Value Ref Range Status  05/17/2019 43.30 30.00 - 100.00 ng/mL Final   Vit D, 25-Hydroxy  Date Value Ref Range Status  05/02/2020 32.4 30.0 - 100.0 ng/mL Final    Comment:    Vitamin D deficiency has been defined by the [place] and an Endocrine Society practice guideline as a level of serum 25-OH vitamin D less than 20 ng/mL (1,2). The Endocrine Society went on to further define vitamin D insufficiency as a level between 21 and 29 ng/mL (2). 1. IOM (Institute of Medicine). 2010. Dietary reference    intakes for calcium and D. [place]: The    [employer]. 2. Holick MF, Binkley [place], Bischoff-Ferrari HA, et al.    Evaluation, treatment, and prevention of vitamin D    deficiency: an Endocrine Society clinical practice    guideline. JCEM. 2011 Jul; 96(7):1911-30.          Passed - Ca in normal range and within 360 days    Calcium  Date Value Ref Range Status  11/12/2022 10.1 8.4 - 10.5 mg/dL Final         Passed - Valid encounter within last 12 months    Recent Outpatient Visits           3 months ago Hospital discharge follow-up   [place], [place] W, NP   6 months ago COPD with chronic bronchitis [facility])   [place] [name], MD   8 months ago [place] [name], MD   9 months ago Strep throat   [place] [name], MD   1 year ago Neuropathic pain   [place] [name], MD                 Requested Prescriptions  Pending Prescriptions Disp Refills   Vitamin D, Ergocalciferol, (DRISDOL) 1.25 MG (50000 UNIT) CAPS capsule [Pharmacy Med Name: Vitamin D (Ergocalciferol) 1.25 MG (50000 UT) Oral Capsule] 15 capsule 2    Sig: TAKE 1 CAPSULE BY MOUTH EVERY 7  DAYS     Endocrinology:  Vitamins - Vitamin D Supplementation 2 Failed - 12/22/2022 10:06 PM      Failed - Manual Review: Route requests for 50,000 IU strength to the provider      Failed - Vitamin D in normal range and within 360 days    VITD  Date Value Ref Range Status  05/17/2019 43.30 30.00 - 100.00 ng/mL  Final   Vit D, 25-Hydroxy  Date Value Ref Range Status  05/02/2020 32.4 30.0 - 100.0 ng/mL Final    Comment:    Vitamin D deficiency has been defined by the [place] practice guideline as a level of serum 25-OH vitamin D less than 20 ng/mL (1,2). The Endocrine Society went on to further define vitamin D insufficiency as a level between 21 and 29 ng/mL (2). 1. IOM (Institute of Medicine). 2010. Dietary reference    intakes for calcium and D. [place]: The    [employer]. 2. Holick MF, Binkley [place], Bischoff-Ferrari HA, et al.    Evaluation, treatment, and prevention of vitamin D    deficiency: an Endocrine Society clinical practice    guideline. JCEM. 2011 Jul; 96(7):1911-30.          Passed - Ca in normal range and within 360 days    Calcium  Date Value Ref Range Status  11/12/2022 10.1 8.4 - 10.5  mg/dL Final         Passed - Valid encounter within last 12 months    Recent Outpatient Visits           3 months ago Hospital discharge follow-up   [place] Gildardo Pounds, NP   6 months ago COPD with chronic bronchitis [facility])   [place] [name], MD   8 months ago [place] [name], MD   9 months ago Strep throat   [place] [name], MD   1 year ago Neuropathic pain   [place] [name], MD

## 2022-12-31 DIAGNOSIS — G8929 Other chronic pain: Secondary | ICD-10-CM | POA: Diagnosis not present

## 2022-12-31 DIAGNOSIS — R1013 Epigastric pain: Secondary | ICD-10-CM | POA: Diagnosis not present

## 2022-12-31 DIAGNOSIS — Z79891 Long term (current) use of opiate analgesic: Secondary | ICD-10-CM | POA: Diagnosis not present

## 2022-12-31 DIAGNOSIS — F17210 Nicotine dependence, cigarettes, uncomplicated: Secondary | ICD-10-CM | POA: Diagnosis not present

## 2022-12-31 DIAGNOSIS — Z79899 Other long term (current) drug therapy: Secondary | ICD-10-CM | POA: Diagnosis not present

## 2022-12-31 DIAGNOSIS — M545 Low back pain, unspecified: Secondary | ICD-10-CM | POA: Diagnosis not present

## 2023-01-08 ENCOUNTER — Other Ambulatory Visit: Payer: Self-pay | Admitting: Critical Care Medicine

## 2023-01-17 ENCOUNTER — Other Ambulatory Visit: Payer: Self-pay | Admitting: Critical Care Medicine

## 2023-01-19 ENCOUNTER — Other Ambulatory Visit: Payer: Self-pay | Admitting: Critical Care Medicine

## 2023-01-19 ENCOUNTER — Other Ambulatory Visit: Payer: Self-pay

## 2023-01-19 ENCOUNTER — Other Ambulatory Visit: Payer: Self-pay | Admitting: Family Medicine

## 2023-01-19 DIAGNOSIS — M792 Neuralgia and neuritis, unspecified: Secondary | ICD-10-CM

## 2023-01-19 DIAGNOSIS — M48062 Spinal stenosis, lumbar region with neurogenic claudication: Secondary | ICD-10-CM

## 2023-01-20 ENCOUNTER — Other Ambulatory Visit: Payer: Self-pay

## 2023-01-20 ENCOUNTER — Other Ambulatory Visit: Payer: Self-pay | Admitting: Critical Care Medicine

## 2023-01-20 DIAGNOSIS — M792 Neuralgia and neuritis, unspecified: Secondary | ICD-10-CM

## 2023-01-20 DIAGNOSIS — M48062 Spinal stenosis, lumbar region with neurogenic claudication: Secondary | ICD-10-CM

## 2023-01-20 MED ORDER — FLUTICASONE FUROATE-VILANTEROL 200-25 MCG/ACT IN AEPB
1.0000 | INHALATION_SPRAY | Freq: Every day | RESPIRATORY_TRACT | 0 refills | Status: DC
  Filled 2023-01-20: qty 60, 30d supply, fill #0

## 2023-01-21 ENCOUNTER — Other Ambulatory Visit: Payer: Self-pay

## 2023-01-21 DIAGNOSIS — M792 Neuralgia and neuritis, unspecified: Secondary | ICD-10-CM

## 2023-01-21 DIAGNOSIS — M48062 Spinal stenosis, lumbar region with neurogenic claudication: Secondary | ICD-10-CM

## 2023-01-22 ENCOUNTER — Other Ambulatory Visit: Payer: Self-pay

## 2023-01-22 MED ORDER — PREGABALIN 150 MG PO CAPS
150.0000 mg | ORAL_CAPSULE | Freq: Three times a day (TID) | ORAL | 6 refills | Status: DC
  Filled 2023-01-22: qty 90, 30d supply, fill #0
  Filled 2023-02-20: qty 90, 30d supply, fill #1
  Filled 2023-03-22 – 2023-03-25 (×2): qty 90, 30d supply, fill #2
  Filled 2023-04-23: qty 90, 30d supply, fill #3
  Filled 2023-05-25: qty 90, 30d supply, fill #4
  Filled 2023-07-02: qty 90, 30d supply, fill #5
  Filled 2023-07-30: qty 90, 30d supply, fill #6

## 2023-01-23 ENCOUNTER — Other Ambulatory Visit: Payer: Self-pay

## 2023-01-27 DIAGNOSIS — F17210 Nicotine dependence, cigarettes, uncomplicated: Secondary | ICD-10-CM | POA: Diagnosis not present

## 2023-01-27 DIAGNOSIS — R1013 Epigastric pain: Secondary | ICD-10-CM | POA: Diagnosis not present

## 2023-01-27 DIAGNOSIS — M545 Low back pain, unspecified: Secondary | ICD-10-CM | POA: Diagnosis not present

## 2023-01-27 DIAGNOSIS — G8929 Other chronic pain: Secondary | ICD-10-CM | POA: Diagnosis not present

## 2023-01-27 DIAGNOSIS — Z79899 Other long term (current) drug therapy: Secondary | ICD-10-CM | POA: Diagnosis not present

## 2023-01-30 ENCOUNTER — Encounter: Payer: Self-pay | Admitting: Gastroenterology

## 2023-01-30 ENCOUNTER — Ambulatory Visit (INDEPENDENT_AMBULATORY_CARE_PROVIDER_SITE_OTHER): Payer: Medicare Other | Admitting: Gastroenterology

## 2023-01-30 VITALS — BP 120/90 | HR 86 | Ht 68.0 in | Wt 171.5 lb

## 2023-01-30 DIAGNOSIS — Z79899 Other long term (current) drug therapy: Secondary | ICD-10-CM | POA: Diagnosis not present

## 2023-01-30 DIAGNOSIS — Z716 Tobacco abuse counseling: Secondary | ICD-10-CM

## 2023-01-30 DIAGNOSIS — C3411 Malignant neoplasm of upper lobe, right bronchus or lung: Secondary | ICD-10-CM

## 2023-01-30 DIAGNOSIS — F17200 Nicotine dependence, unspecified, uncomplicated: Secondary | ICD-10-CM | POA: Diagnosis not present

## 2023-01-30 DIAGNOSIS — K860 Alcohol-induced chronic pancreatitis: Secondary | ICD-10-CM | POA: Diagnosis not present

## 2023-01-30 DIAGNOSIS — K861 Other chronic pancreatitis: Secondary | ICD-10-CM | POA: Diagnosis not present

## 2023-01-30 DIAGNOSIS — Z860100 Personal history of colon polyps, unspecified: Secondary | ICD-10-CM

## 2023-01-30 MED ORDER — AMITRIPTYLINE HCL 50 MG PO TABS: 50.0000 mg | ORAL_TABLET | Freq: Every day | ORAL | 3 refills | Status: DC
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
## 2023-01-30 NOTE — Patient Instructions (Addendum)
We have sent the following medications to your pharmacy for you to pick up at your convenience: [name]   Follow up in 6 months. Office will contact you to schedule at a later time.   _______________________________________________________  If your blood pressure at your visit was 140/90 or greater, please contact your primary care physician to follow up on this.  _______________________________________________________  If you are age 58 or older, your body mass index should be between 23-30. Your Body mass index is 26.08 kg/m. If this is out of the aforementioned range listed, please consider follow up with your Primary Care Provider.  If you are age 34 or younger, your body mass index should be between 19-25. Your Body mass index is 26.08 kg/m. If this is out of the aformentioned range listed, please consider follow up with your Primary Care Provider.   ________________________________________________________  The [place] [facility] providers would like to encourage you to use [facility] to communicate with providers for non-urgent requests or questions.  Due to long hold times on the telephone, sending your provider a message by [facility] may be a faster and more efficient way to get a response.  Please allow 48 business hours for a response.  Please remember that this is for non-urgent requests.  _______________________________________________________  Thank you for choosing me and [facility].  Dr. Gerrit Heck

## 2023-02-20 ENCOUNTER — Other Ambulatory Visit: Payer: Self-pay | Admitting: Critical Care Medicine

## 2023-02-20 ENCOUNTER — Other Ambulatory Visit: Payer: Self-pay

## 2023-02-23 ENCOUNTER — Other Ambulatory Visit: Payer: Self-pay | Admitting: Student

## 2023-02-23 ENCOUNTER — Other Ambulatory Visit: Payer: Self-pay

## 2023-02-23 NOTE — Telephone Encounter (Signed)
LOV 010/18/2023 and to Return in about 4 weeks (around 10/15/2022)   Pt requesting a refill Request on the following: fluticasone furoate-vilanterol (BREO ELLIPTA) 200-25 MCG/ACT [place] (Ph: [phone])   Appt already sch for the following:    Name: Wesley [name], Wesley [name] MRN: [id number]  Date: 04/06/2023 Status: Sch  Time: 9:45 AM Length: 30  Visit Type: OPEN ESTABLISHED [726] Copay: $0.00  Provider: Johny Blamer

## 2023-02-24 ENCOUNTER — Other Ambulatory Visit: Payer: Self-pay | Admitting: *Deleted

## 2023-02-24 ENCOUNTER — Other Ambulatory Visit: Payer: Self-pay

## 2023-02-24 MED ORDER — FLUTICASONE FUROATE-VILANTEROL 200-25 MCG/ACT IN AEPB
1.0000 | INHALATION_SPRAY | Freq: Every day | RESPIRATORY_TRACT | 3 refills | Status: DC
  Filled 2023-02-24: qty 60, 30d supply, fill #0
  Filled 2023-03-22 – 2023-03-25 (×2): qty 60, 30d supply, fill #1
  Filled 2023-04-23 (×2): qty 60, 30d supply, fill #2

## 2023-02-24 NOTE — Addendum Note (Signed)
Addended by: Riesa Pope on: 02/24/2023 09:36 AM   Modules accepted: Orders

## 2023-02-24 NOTE — Telephone Encounter (Signed)
Next appt scheduled 5/6 with PCP. 

## 2023-02-25 DIAGNOSIS — G8929 Other chronic pain: Secondary | ICD-10-CM | POA: Diagnosis not present

## 2023-02-25 DIAGNOSIS — M545 Low back pain, unspecified: Secondary | ICD-10-CM | POA: Diagnosis not present

## 2023-02-25 DIAGNOSIS — F17210 Nicotine dependence, cigarettes, uncomplicated: Secondary | ICD-10-CM | POA: Diagnosis not present

## 2023-02-25 DIAGNOSIS — Z79899 Other long term (current) drug therapy: Secondary | ICD-10-CM | POA: Diagnosis not present

## 2023-02-25 DIAGNOSIS — R1013 Epigastric pain: Secondary | ICD-10-CM | POA: Diagnosis not present

## 2023-02-26 ENCOUNTER — Ambulatory Visit (HOSPITAL_COMMUNITY)
Admission: RE | Admit: 2023-02-26 | Discharge: 2023-02-26 | Disposition: A | Discharge status: Home / Self Care | Payer: Medicare Other | Source: Ambulatory Visit | Attending: Radiation Oncology | Admitting: Radiation Oncology

## 2023-02-26 DIAGNOSIS — J9811 Atelectasis: Secondary | ICD-10-CM | POA: Diagnosis not present

## 2023-02-26 DIAGNOSIS — J439 Emphysema, unspecified: Secondary | ICD-10-CM | POA: Diagnosis not present

## 2023-02-26 DIAGNOSIS — C3490 Malignant neoplasm of unspecified part of unspecified bronchus or lung: Secondary | ICD-10-CM | POA: Diagnosis not present

## 2023-02-26 DIAGNOSIS — C3411 Malignant neoplasm of upper lobe, right bronchus or lung: Secondary | ICD-10-CM | POA: Diagnosis not present

## 2023-02-27 ENCOUNTER — Other Ambulatory Visit: Payer: Self-pay | Admitting: Critical Care Medicine

## 2023-03-02 DIAGNOSIS — Z79899 Other long term (current) drug therapy: Secondary | ICD-10-CM | POA: Diagnosis not present

## 2023-03-02 NOTE — Telephone Encounter (Signed)
Requested Prescriptions  Pending Prescriptions Disp Refills   tamsulosin (FLOMAX) 0.4 MG CAPS capsule [Pharmacy Med Name: Tamsulosin HCl 0.4 MG Oral Capsule] 100 capsule 1    Sig: TAKE 1 CAPSULE BY MOUTH DAILY     Urology: Alpha-Adrenergic Blocker Failed - 02/27/2023 10:23 PM      Failed - Last BP in normal range    BP Readings from Last 1 Encounters:  01/30/23 (!) 120/90         Passed - PSA in normal range and within 360 days    Prostate Specific Ag, Serum  Date Value Ref Range Status  03/20/2022 2.5 0.0 - 4.0 ng/mL Final    Comment:    Roche ECLIA methodology. According to the American Urological Association, Serum PSA should decrease and remain at undetectable levels after radical prostatectomy. The AUA defines biochemical recurrence as an initial PSA value 0.2 ng/mL or greater followed by a subsequent confirmatory PSA value 0.2 ng/mL or greater. Values obtained with different assay methods or kits cannot be used interchangeably. Results cannot be interpreted as absolute evidence of the presence or absence of malignant disease.          Passed - Valid encounter within last 12 months    Recent Outpatient Visits           6 months ago Hospital discharge follow-up   [place] Gildardo Pounds, NP   8 months ago COPD with chronic bronchitis [facility])   [place] [name], MD   10 months ago [place] [name], MD   11 months ago Strep throat   [place] [name], MD   1 year ago Neuropathic pain   [place] [name], MD

## 2023-03-04 NOTE — Progress Notes (Signed)
Radiation Oncology         [phone]) [phone] ________________________________  Name: Wesley [name] MRN: [id number]  Date: 03/05/2023  DOB: [birth year]  Follow-Up New Visit   Outpatient   CC: Wesley Blamer, DO  Collene Gobble, MD  No diagnosis found.  Diagnosis: The primary encounter diagnosis was Malignant neoplasm of right upper lobe of lung ([place]). A diagnosis of Mediastinal adenopathy was also pertinent to this visit.   Enlarging hypermetabolic RUL pulmonary nodule suspicious for malignancy (negative nodal biopsies)   Narrative/Interval History:  The patient returns today for follow-up and to review recent imaging. The patient was seen for CT sim on 11/26/22. Despite extensive evaluation, we were unable to adequately see his nodule in the right upper lobe, and therefore we could not adequately treat him with SBRT at the time. Subsequently, I recommended that he repeat a chest CT scan in 3 months for further evaluation and to re-assess the need for radiation.     As discussed, the patient recently presented for a follow-up chest CT without contrast on 02/26/23 which showed an interval decrease in size of the posterior RUL nodule. CT also showed interval development of a nonspecific subtle ground-glass opacity in the central upper lobes bilaterally, likely representing an infectious/inflammatory etiology; and an interval decrease in size of the left internal mammary lymph node which appeared to be hypermetabolic on previous PET imaging. (CT also showed an interval decrease in fluid collections associated with the spleen with stable prominence in the tail of the pancreas).   ***                   Allergies:  has No Known Allergies.  Meds: Current Outpatient Medications  Medication Sig Dispense Refill   acetaminophen (TYLENOL) 500 MG tablet Take 2 tablets (1,000 mg total) by mouth every 8 (eight) hours as needed for moderate pain. 120 tablet 0   albuterol (VENTOLIN HFA) 108 (90 Base) MCG/ACT  inhaler Inhale 2 puffs into the lungs every 6 (six) hours as needed for wheezing or shortness of breath. 8.5 g 0   amitriptyline ([name]) 50 MG tablet Take 1 tablet (50 mg total) by mouth at bedtime. 90 tablet 3   baclofen (LIORESAL) 10 MG tablet Take 0.5-1 tablets (5-10 mg total) by mouth every 8 (eight) hours as needed. (Patient taking differently: Take 5-10 mg by mouth every 8 (eight) hours as needed for muscle spasms.) 60 each 4   DULoxetine (CYMBALTA) 60 MG capsule TAKE 1 CAPSULE BY MOUTH DAILY 90 capsule 1   famotidine (PEPCID) 20 MG tablet TAKE 1 TABLET BY MOUTH TWICE  DAILY 200 tablet 2   fluticasone furoate-vilanterol (BREO ELLIPTA) 200-25 MCG/ACT AEPB Inhale 1 puff into the lungs daily. (Must have office visit for refills) 60 each 3   folic acid (FOLVITE) 1 MG tablet Take 1 tablet (1 mg total) by mouth daily. 60 tablet 4   lidocaine (LIDODERM) 5 % Place 1 patch onto the skin 2 (two) times a week. Remove & Discard patch within 12 hours or as directed by MD     lipase/protease/amylase ([name]) 36000 UNITS CPEP capsule Take 2 capsules (72,000 Units total) by mouth 3 (three) times daily with meals AND 1 capsule (36,000 Units total) with snacks. Max: 9 capsules per [place]. 270 capsule 2   Multiple Vitamin (MULTIVITAMIN WITH MINERALS) TABS tablet Take 1 tablet by mouth daily.     ondansetron (ZOFRAN) 4 MG tablet Take 1 tablet (4 mg total) by mouth every  8 (eight) hours as needed for nausea or vomiting. 60 tablet 1   oxycodone (ROXICODONE) 30 MG immediate release tablet Take 15 mg by mouth 2 (two) times daily.     pantoprazole (PROTONIX) 40 MG tablet TAKE 1 TABLET BY MOUTH TWICE  DAILY BEFORE A MEAL 200 tablet 2   polyethylene glycol (MIRALAX / GLYCOLAX) 17 g packet Take 17 g by mouth daily as needed (constipation).     pregabalin (LYRICA) 150 MG capsule Take 1 capsule (150 mg total) by mouth in the morning, at noon, and at bedtime. 90 capsule 6   senna (SENOKOT) 8.6 MG TABS tablet Take 1 tablet (8.6  mg total) by mouth daily. 90 tablet 1   tamsulosin (FLOMAX) 0.4 MG CAPS capsule TAKE 1 CAPSULE BY MOUTH DAILY 100 capsule 1   thiamine 100 MG tablet TAKE 1 TABLET (100 MG TOTAL) BY MOUTH DAILY. (Patient taking differently: Take 100 mg by mouth daily.) 30 tablet 6   vitamin B-12 (CYANOCOBALAMIN) 1000 MCG tablet Take 1 tablet (1,000 mcg total) by mouth daily. 60 tablet 4   Vitamin D, Ergocalciferol, (DRISDOL) 1.25 MG (50000 UNIT) CAPS capsule Take 1 capsule (50,000 Units total) by mouth every 7 (seven) days. (Patient taking differently: Take 50,000 Units by mouth every Tuesday.) 14 capsule 3   No current facility-administered medications for this encounter.    Physical Findings: The patient is in no acute distress. Patient is alert and oriented.  vitals were not taken for this visit. .  No significant changes. Lungs are clear to auscultation bilaterally. Heart has regular rate and rhythm. No palpable cervical, supraclavicular, or axillary adenopathy. Abdomen soft, non-tender, normal bowel sounds.   Lab Findings: Lab Results  Component Value Date   WBC 11.4 (H) 11/12/2022   HGB 14.8 11/12/2022   HCT 43.7 11/12/2022   MCV 87.1 11/12/2022   PLT 325.0 11/12/2022    Radiographic Findings: CT CHEST WO CONTRAST  Result Date: 02/27/2023 CLINICAL DATA:  Non-small-cell lung cancer. Restaging. * Tracking Code: BO * EXAM: CT CHEST WITHOUT CONTRAST TECHNIQUE: Multidetector CT imaging of the chest was performed following the standard protocol without IV contrast. RADIATION DOSE REDUCTION: This exam was performed according to the departmental dose-optimization program which includes automated exposure control, adjustment of the mA and/or kV according to patient size and/or use of iterative reconstruction technique. COMPARISON:  PET-CT 10/01/2022.  Chest CT 08/28/2022. FINDINGS: Cardiovascular: The heart size is normal. No substantial pericardial effusion. Coronary artery calcification is evident. Mild  atherosclerotic calcification is noted in the wall of the thoracic aorta. Mediastinum/Nodes: Stable 8 mm short axis precarinal lymph node. No mediastinal lymphadenopathy. Index left internal mammary node measures 6 mm on 55/2, decreased from 12 mm on 08/28/2022. No evidence for gross hilar lymphadenopathy although assessment is limited by the lack of intravenous contrast on the current study. The esophagus has normal imaging features. There is no axillary lymphadenopathy. Lungs/Pleura: Extensive changes of paraseptal emphysema noted in the lung apices. Subsegmental atelectasis or scarring in the right middle lobe is stable in the interval. Posterior right upper lobe perifissural nodule measured previously at 12 x 12 mm is 9 x 5 mm today on image 49/7. Interval development of subtle ground-glass opacity in the central upper lobes bilaterally. No new suspicious pulmonary nodule or mass. There is no evidence of pleural effusion. Upper Abdomen: Stable appearance of hepatic cysts. Interval decrease in fluid collections associated with the spleen with stable prominence in the tail the pancreas. Musculoskeletal: No worrisome lytic or  sclerotic osseous abnormality. IMPRESSION: 1. Interval decrease in size of the posterior right upper lobe nodule. 2. Interval development of subtle ground-glass opacity in the central upper lobes bilaterally. Imaging features are nonspecific and may be infectious/inflammatory. 3. Interval decrease in size of the left internal mammary lymph node seen to be hypermetabolic on previous PET imaging. 4. Interval decrease in fluid collections associated with the spleen with stable prominence in the tail the pancreas. 5. Aortic Atherosclerosis (ICD10-I70.0) and Emphysema (ICD10-J43.9). Electronically Signed   By: Misty Stanley M.D.   On: 02/27/2023 10:09    Impression: The primary encounter diagnosis was Malignant neoplasm of right upper lobe of lung ([place]). A diagnosis of Mediastinal adenopathy was  also pertinent to this visit.   Enlarging hypermetabolic RUL pulmonary nodule suspicious for malignancy (negative nodal biopsies)    The patient is recovering from the effects of radiation.  ***  Plan:  ***   *** minutes of total time was spent for this patient encounter, including preparation, face-to-face counseling with the patient and coordination of care, physical exam, and documentation of the encounter. ____________________________________  Blair Promise, PhD, MD  This document serves as a record of services personally performed by Gery Pray, MD. It was created on his behalf by Roney Mans, a trained medical scribe. The creation of this record is based on the scribe's personal observations and the provider's statements to them. This document has been checked and approved by the attending provider.

## 2023-03-04 NOTE — Progress Notes (Signed)
Thoracic Location of Tumor / Histology: Right upper lobe pulmonary nodule  PET Scan 10-01-22 IMPRESSION: 1. 12 mm posterior right upper lobe pulmonary nodule of concern on recent chest CT is markedly hypermetabolic, consistent with primary bronchogenic neoplasm. 2. Hypermetabolic lymph nodes in the right neck, mediastinum, and left internal mammary chain, raising concern for metastatic disease. 3. No evidence for hypermetabolism associated with the pancreatic tail fullness described on previous CT imaging. 4. Areas of nodular hypermetabolism around the low-density splenic lesion, indeterminate. Attention on follow-up recommended. 5. Bilateral groin hernias contain only fat. 6.  Aortic Atherosclerosis (ICD10-I70.0).   CT CHEST WO CONTRAST 02-26-23 IMPRESSION: 1. Interval decrease in size of the posterior right upper lobe nodule. 2. Interval development of subtle ground-glass opacity in the central upper lobes bilaterally. Imaging features are nonspecific and may be infectious/inflammatory. 3. Interval decrease in size of the left internal mammary lymph node seen to be hypermetabolic on previous PET imaging. 4. Interval decrease in fluid collections associated with the spleen with stable prominence in the tail the pancreas. 5. Aortic Atherosclerosis (ICD10-I70.0) and Emphysema (ICD10-J43.9)   Patient presented with symptoms of:  per Dr. Agustina Caroli H&P: patient " was hospitalized between 9/27 and 09/03/2022.  During that hospitalization he had an incidentally observed right upper lobe pulmonary nodule that needed further follow-up."   Biopsies revealed:  10-28-23 Clinical History: None provided  Specimen Submitted:  A. LUNG, RUL, LAVAGE:    FINAL MICROSCOPIC DIAGNOSIS:  - No malignant cells identified   SPECIMEN ADEQUACY:  Satisfactory for evaluation    10-27-22 FINAL MICROSCOPIC DIAGNOSIS:   B. LYMPH NODE, 4L, FINE NEEDLE ASPIRATION:  - No malignant cells identified   C.  LYMPH NODE, STATION 7, FINE NEEDLE ASPIRATION:  - No malignant cells identified   D. LYMPH NODE, 4R, FINE NEEDLE ASPIRATION:  - No malignant cells identified   11-01-23 FINAL MICROSCOPIC DIAGNOSIS:   A. LYMPH NODE, LEFT INTERNAL MAMMARY, NEEDLE CORE BIOPSY:  -  Scant fragment of crushed lymphoid tissue.  -  No morphologic or immunohistochemical evidence of malignancy on  limited material.   Note: The specimen consists of a few scant fragments of lymphoid tissue  which are predominantly crushed.  An immunohistochemical stain is  performed (pankeratin) and is negative.    Tobacco/Marijuana/Snuff/ETOH use:  Patient is s current every [place] smoker (~0.75 pack/[place]). Denies any current alcohol use (stopped drinking 04/2019). Denies any vaping or recreational drug use   Past/Anticipated interventions by cardiothoracic surgery, if any:  10/31/2022 --Dr. Markus Daft CT guided biopsy of the left internal mammary lymph node     10/27/2022 --Dr. Baltazar Apo Flexible video fiberoptic bronchoscopy with endobronchial ultrasound and biopsies   Called the patient to review his lymph node biopsy results.  No answer, left him a voicemail.  Cytology was negative for malignancy.  I reviewed his case in thoracic oncology conference.  Consensus was that it would be reasonable to refer him to radiation oncology for empiric treatment given suspicion regarding the right upper lobe pulmonary nodule.  I will call him back to discuss and see if he is willing to see radiation oncology.  Past/Anticipated interventions by medical oncology, if any:  None in EMR  Signs/Symptoms Weight changes, if any: {:18581} Respiratory complaints, if any: {:18581} Hemoptysis, if any: {:18581} Pain issues, if any:  {:18581}  SAFETY ISSUES: Prior radiation? {:18581} Pacemaker/ICD? {:18581}  Possible current pregnancy?{:18581} Is the patient on methotrexate? {:18581}  Current Complaints / other details:  ***

## 2023-03-05 ENCOUNTER — Ambulatory Visit
Admission: RE | Admit: 2023-03-05 | Discharge: 2023-03-05 | Disposition: A | Discharge status: Home / Self Care | Payer: Medicare Other | Source: Ambulatory Visit | Attending: Radiation Oncology | Admitting: Radiation Oncology

## 2023-03-05 ENCOUNTER — Encounter: Payer: Self-pay | Admitting: Radiation Oncology

## 2023-03-05 VITALS — BP 136/92 | HR 88 | Temp 96.7°F | Resp 18 | Ht 68.0 in | Wt 173.1 lb

## 2023-03-05 DIAGNOSIS — K7689 Other specified diseases of liver: Secondary | ICD-10-CM | POA: Diagnosis not present

## 2023-03-05 DIAGNOSIS — R590 Localized enlarged lymph nodes: Secondary | ICD-10-CM

## 2023-03-05 DIAGNOSIS — Z923 Personal history of irradiation: Secondary | ICD-10-CM | POA: Insufficient documentation

## 2023-03-05 DIAGNOSIS — Z7951 Long term (current) use of inhaled steroids: Secondary | ICD-10-CM | POA: Diagnosis not present

## 2023-03-05 DIAGNOSIS — Z79899 Other long term (current) drug therapy: Secondary | ICD-10-CM | POA: Insufficient documentation

## 2023-03-05 DIAGNOSIS — J439 Emphysema, unspecified: Secondary | ICD-10-CM | POA: Insufficient documentation

## 2023-03-05 DIAGNOSIS — C3411 Malignant neoplasm of upper lobe, right bronchus or lung: Secondary | ICD-10-CM | POA: Diagnosis not present

## 2023-03-05 DIAGNOSIS — F17210 Nicotine dependence, cigarettes, uncomplicated: Secondary | ICD-10-CM | POA: Diagnosis not present

## 2023-03-06 ENCOUNTER — Other Ambulatory Visit: Payer: Self-pay | Admitting: Critical Care Medicine

## 2023-03-09 NOTE — Telephone Encounter (Signed)
Requested medication (s) are due for refill today: yes  Requested medication (s) are on the active medication list: yes  Last refill:  03/20/22  Future visit scheduled: yes  Notes to clinic:   Manual Review: Route requests for 50,000 IU strength to the provider      Requested Prescriptions  Pending Prescriptions Disp Refills   Vitamin D, Ergocalciferol, (DRISDOL) 1.25 MG (50000 UNIT) CAPS capsule [Pharmacy Med Name: Vitamin D (Ergocalciferol) 1.25 MG (50000 UT) Oral Capsule] 11 capsule 3    Sig: TAKE 1 CAPSULE BY MOUTH EVERY 7  DAYS     Endocrinology:  Vitamins - Vitamin D Supplementation 2 Failed - 03/06/2023 10:42 PM      Failed - Manual Review: Route requests for 50,000 IU strength to the provider      Failed - Vitamin D in normal range and within 360 days    VITD  Date Value Ref Range Status  05/17/2019 43.30 30.00 - 100.00 ng/mL Final   Vit D, 25-Hydroxy  Date Value Ref Range Status  05/02/2020 32.4 30.0 - 100.0 ng/mL Final    Comment:    Vitamin D deficiency has been defined by the Institute of Medicine and an Endocrine Society practice guideline as a level of serum 25-OH vitamin D less than 20 ng/mL (1,2). The Endocrine Society went on to further define vitamin D insufficiency as a level between 21 and 29 ng/mL (2). 1. IOM (Institute of Medicine). 2010. Dietary reference    intakes for calcium and D. [place]: The    [employer]. 2. Holick MF, Binkley Iliamna, Bischoff-Ferrari HA, et al.    Evaluation, treatment, and prevention of vitamin D    deficiency: an Endocrine Society clinical practice    guideline. JCEM. 2011 Jul; 96(7):1911-30.          Passed - Ca in normal range and within 360 days    Calcium  Date Value Ref Range Status  11/12/2022 10.1 8.4 - 10.5 mg/dL Final         Passed - Valid encounter within last 12 months    Recent Outpatient Visits           6 months ago Hospital discharge follow-up   [facility] Claiborne Rigg, NP   9 months ago [facility]   [facility] Storm Frisk, MD   11 months ago Joline Salt   [facility] Storm Frisk, MD   11 months ago Strep throat   [facility] Storm Frisk, MD   1 year ago Neuropathic pain   [facility] Storm Frisk, MD

## 2023-03-18 ENCOUNTER — Other Ambulatory Visit: Payer: Self-pay | Admitting: Critical Care Medicine

## 2023-03-23 ENCOUNTER — Other Ambulatory Visit: Payer: Self-pay

## 2023-03-25 ENCOUNTER — Other Ambulatory Visit: Payer: Self-pay

## 2023-03-27 DIAGNOSIS — F17210 Nicotine dependence, cigarettes, uncomplicated: Secondary | ICD-10-CM | POA: Diagnosis not present

## 2023-03-27 DIAGNOSIS — Z79899 Other long term (current) drug therapy: Secondary | ICD-10-CM | POA: Diagnosis not present

## 2023-03-27 DIAGNOSIS — M545 Low back pain, unspecified: Secondary | ICD-10-CM | POA: Diagnosis not present

## 2023-03-27 DIAGNOSIS — Z79891 Long term (current) use of opiate analgesic: Secondary | ICD-10-CM | POA: Diagnosis not present

## 2023-03-27 DIAGNOSIS — R1013 Epigastric pain: Secondary | ICD-10-CM | POA: Diagnosis not present

## 2023-03-27 DIAGNOSIS — G8929 Other chronic pain: Secondary | ICD-10-CM | POA: Diagnosis not present

## 2023-04-02 DIAGNOSIS — Z79899 Other long term (current) drug therapy: Secondary | ICD-10-CM | POA: Diagnosis not present

## 2023-04-06 ENCOUNTER — Ambulatory Visit (INDEPENDENT_AMBULATORY_CARE_PROVIDER_SITE_OTHER): Payer: Medicare Other | Admitting: Student

## 2023-04-06 ENCOUNTER — Other Ambulatory Visit: Payer: Self-pay

## 2023-04-06 ENCOUNTER — Encounter: Payer: Self-pay | Admitting: Student

## 2023-04-06 ENCOUNTER — Ambulatory Visit (INDEPENDENT_AMBULATORY_CARE_PROVIDER_SITE_OTHER): Payer: Medicare Other

## 2023-04-06 VITALS — BP 111/84 | HR 85 | Temp 98.2°F | Resp 28 | Ht 68.0 in | Wt 171.3 lb

## 2023-04-06 VITALS — BP 111/84 | HR 85 | Temp 98.2°F | Ht 68.0 in | Wt 171.3 lb

## 2023-04-06 DIAGNOSIS — J4489 Other specified chronic obstructive pulmonary disease: Secondary | ICD-10-CM

## 2023-04-06 DIAGNOSIS — Z720 Tobacco use: Secondary | ICD-10-CM

## 2023-04-06 DIAGNOSIS — F17200 Nicotine dependence, unspecified, uncomplicated: Secondary | ICD-10-CM

## 2023-04-06 DIAGNOSIS — F17210 Nicotine dependence, cigarettes, uncomplicated: Secondary | ICD-10-CM | POA: Diagnosis not present

## 2023-04-06 DIAGNOSIS — Z0000 Encounter for general adult medical examination without abnormal findings: Secondary | ICD-10-CM

## 2023-04-06 MED ORDER — VARENICLINE TARTRATE 0.5 MG PO TABS
0.5000 mg | ORAL_TABLET | Freq: Every day | ORAL | 0 refills | Status: DC
Start: 2023-04-06 — End: 2023-04-22

## 2023-04-06 MED ORDER — UMECLIDINIUM BROMIDE 62.5 MCG/ACT IN AEPB
1.0000 | INHALATION_SPRAY | Freq: Every day | RESPIRATORY_TRACT | 2 refills | Status: DC
Start: 2023-04-06 — End: 2023-04-28
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
## 2023-04-06 NOTE — Assessment & Plan Note (Signed)
Patient is on Breo once daily with PRN albuterol. Prior note and patient stated he was on Trelegy before. Endorses daily cough with mucus that he has noticed more in past few weeks. No fever, chills or recent sick contacts. No wheezing or crackles heard on exam today and normal work of breathing. Of note, increased smoking to 1 ppd from 1/2 ppd in past few months due to stressors. Follows with pulmonology and last saw Dr. Delton Coombes on 10/2022. Patient was unsure if he could f/u with Dr. Delton Coombes after that. Advised patient to call his office to schedule f/u appointment which he plans to do so.   Plan -continue Breo daily -trial Incruse daily  -continue following with pulmonology

## 2023-04-06 NOTE — Assessment & Plan Note (Signed)
Patient with long standing hx of smoking. Had previously decreased to 1/2 ppd but due to stressors starting smoking 1 ppd for past few months. Discussed with patient about tobacco cessation and patient initially hesitant but after discussing risks of smoking and his hx of COPD, he is agreeable to starting Chantix. He plans to set a quit date and will pick up the Chantix at his pharmacy.   Plan  -start Chantix and titrate up dose, reassess at next visit

## 2023-04-06 NOTE — Patient Instructions (Addendum)
Thank you, [name] for allowing [place] to provide your care today.   COPD -Inhaler for Incruse Ellipta 1 puff daily sent to pharmacy  -Please continue follow up with your lung doctor, Dr. Delton Coombes. You should be able to call his office to schedule appointment.  Smoking -Chantix sent to pharmacy  -We discussed a quit date for you. You can start your Chantix 0.5 mg daily for 3 days then increase to 0.5 mg twice a Soldo for 4 days then if doing well continue at 1 mg twice a Fate.    I have ordered the following medication/changed the following medications:   Stop the following medications: There are no discontinued medications.   Start the following medications: Meds ordered this encounter  Medications   umeclidinium bromide (INCRUSE ELLIPTA) 62.5 MCG/ACT AEPB    Sig: Inhale 1 puff into the lungs daily.    Dispense:  30 each    Refill:  2   varenicline (CHANTIX) 0.5 MG tablet    Sig: Take 1 tablet (0.5 mg total) by mouth daily. Days 1 to 3: 0.5 mg once daily. Days 4 to 7: 0.5 mg twice daily. R[place] 8 and later: 1 mg twice daily    Dispense:  60 tablet    Refill:  0     Follow up:  2-4 weeks    Should you have any questions or concerns please call the internal medicine clinic at [phone].    Wesley [name], D.O. [facility]

## 2023-04-07 NOTE — Progress Notes (Signed)
Internal Medicine Clinic Attending  Case discussed with Dr. Zheng  At the time of the visit.  We reviewed the resident's history and exam and pertinent patient test results.  I agree with the assessment, diagnosis, and plan of care documented in the resident's note.  

## 2023-04-09 ENCOUNTER — Ambulatory Visit: Payer: Medicare Other | Admitting: Nurse Practitioner

## 2023-04-10 ENCOUNTER — Ambulatory Visit: Payer: Medicare Other | Admitting: Nurse Practitioner

## 2023-04-16 NOTE — Progress Notes (Signed)
Internal Medicine Clinic Attending  Case and documentation reviewed.  I reviewed the AWV findings.  I agree with the assessment, diagnosis, and plan of care documented in the AWV note.     

## 2023-04-21 ENCOUNTER — Other Ambulatory Visit: Payer: Self-pay | Admitting: Student

## 2023-04-21 DIAGNOSIS — F17200 Nicotine dependence, unspecified, uncomplicated: Secondary | ICD-10-CM

## 2023-04-22 ENCOUNTER — Ambulatory Visit (INDEPENDENT_AMBULATORY_CARE_PROVIDER_SITE_OTHER): Payer: Medicare Other | Admitting: Adult Health

## 2023-04-22 ENCOUNTER — Encounter: Payer: Self-pay | Admitting: Adult Health

## 2023-04-22 ENCOUNTER — Ambulatory Visit (INDEPENDENT_AMBULATORY_CARE_PROVIDER_SITE_OTHER): Payer: Medicare Other

## 2023-04-22 VITALS — BP 124/70 | HR 101 | Temp 98.3°F | Ht 68.0 in | Wt 168.6 lb

## 2023-04-22 DIAGNOSIS — Z720 Tobacco use: Secondary | ICD-10-CM

## 2023-04-22 DIAGNOSIS — R059 Cough, unspecified: Secondary | ICD-10-CM

## 2023-04-22 DIAGNOSIS — R9389 Abnormal findings on diagnostic imaging of other specified body structures: Secondary | ICD-10-CM

## 2023-04-22 DIAGNOSIS — J4489 Other specified chronic obstructive pulmonary disease: Secondary | ICD-10-CM | POA: Diagnosis not present

## 2023-04-22 DIAGNOSIS — J439 Emphysema, unspecified: Secondary | ICD-10-CM | POA: Diagnosis not present

## 2023-04-22 DIAGNOSIS — J449 Chronic obstructive pulmonary disease, unspecified: Secondary | ICD-10-CM | POA: Diagnosis not present

## 2023-04-22 MED ORDER — ALBUTEROL SULFATE (2.5 MG/3ML) 0.083% IN NEBU
2.5000 mg | INHALATION_SOLUTION | Freq: Once | RESPIRATORY_TRACT | Status: AC
Start: 2023-04-22 — End: 2023-04-22
  Administered 2023-04-22: 2.5 mg via RESPIRATORY_TRACT

## 2023-04-22 MED ORDER — PREDNISONE 10 MG PO TABS: ORAL_TABLET | ORAL | 0 refills | Status: DC

## 2023-04-22 MED ORDER — METHYLPREDNISOLONE ACETATE 80 MG/ML IJ SUSP
120.0000 mg | Freq: Once | INTRAMUSCULAR | Status: AC
Start: 2023-04-22 — End: 2023-04-22
  Administered 2023-04-22: 120 mg via INTRAMUSCULAR

## 2023-04-22 MED ORDER — DOXYCYCLINE HYCLATE 100 MG PO TABS
100.0000 mg | ORAL_TABLET | Freq: Two times a day (BID) | ORAL | 0 refills | Status: DC
Start: 2023-04-22 — End: 2023-08-08

## 2023-04-22 MED ORDER — ALBUTEROL SULFATE (2.5 MG/3ML) 0.083% IN NEBU: 2.5000 mg | INHALATION_SOLUTION | Freq: Four times a day (QID) | RESPIRATORY_TRACT | 5 refills | Status: DC | PRN

## 2023-04-22 NOTE — Assessment & Plan Note (Signed)
Smoking cessation discussed 

## 2023-04-22 NOTE — Patient Instructions (Signed)
Doxycycline 100 mg twice daily for 1 week, take with food, wear sunscreen if outside Prednisone taper over the next week.  Begin tomorrow Albuterol inhaler or nebulizer as needed Mucinex DM twice daily as needed for cough and congestion Continue on Breo and Incruse inhaler daily Follow-up in 1 week and as needed Please contact office for sooner follow up if symptoms do not improve or worsen or seek emergency care

## 2023-04-22 NOTE — Assessment & Plan Note (Signed)
Abnormal CT chest with right upper lobe lung nodule.  Hypermetabolic on PET scan.  Subsequent follow-up serial CT does show decrease in size.  Patient is been followed by radiation oncology with planned CT chest in October 2024.  Chest x-ray today without acute change.

## 2023-04-22 NOTE — Assessment & Plan Note (Signed)
Acute COPD exacerbation-chest x-ray today shows no acute process.  Patient with some mild exertional desaturations that resolved with rest.  We discussed hospitalization emergency room referral patient declines at this time.  He was given a albuterol nebulizer in the office with decreased wheezing and perceived benefit.  O2 saturation is 97% on room air at rest. Will treat with empiric antibiotics and steroids.  He was given a Depo-Medrol 120 IM injection in the office.  Will begin steroid taper tomorrow.  Have ordered an albuterol nebulizer for home.  Advised patient that if symptoms or not improving or they worsen he must seek emergency room care and evaluation for possible hospital admission.  Patient verbalizes understanding.  Plan  Patient Instructions  Doxycycline 100 mg twice daily for 1 week, take with food, wear sunscreen if outside Prednisone taper over the next week.  Begin tomorrow Albuterol inhaler or nebulizer as needed Mucinex DM twice daily as needed for cough and congestion Continue on Breo and Incruse inhaler daily Follow-up in 1 week and as needed Please contact office for sooner follow up if symptoms do not improve or worsen or seek emergency care    Will plan on close follow-up for next Tuesday in the office.
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
## 2023-04-23 ENCOUNTER — Other Ambulatory Visit: Payer: Self-pay

## 2023-04-23 DIAGNOSIS — J449 Chronic obstructive pulmonary disease, unspecified: Secondary | ICD-10-CM | POA: Diagnosis not present

## 2023-04-24 DIAGNOSIS — R1013 Epigastric pain: Secondary | ICD-10-CM | POA: Diagnosis not present

## 2023-04-24 DIAGNOSIS — Z79891 Long term (current) use of opiate analgesic: Secondary | ICD-10-CM | POA: Diagnosis not present

## 2023-04-24 DIAGNOSIS — F17210 Nicotine dependence, cigarettes, uncomplicated: Secondary | ICD-10-CM | POA: Diagnosis not present

## 2023-04-24 DIAGNOSIS — M545 Low back pain, unspecified: Secondary | ICD-10-CM | POA: Diagnosis not present

## 2023-04-24 DIAGNOSIS — G8929 Other chronic pain: Secondary | ICD-10-CM | POA: Diagnosis not present

## 2023-04-24 DIAGNOSIS — Z79899 Other long term (current) drug therapy: Secondary | ICD-10-CM | POA: Diagnosis not present

## 2023-04-28 ENCOUNTER — Encounter: Payer: Self-pay | Admitting: Adult Health

## 2023-04-28 ENCOUNTER — Ambulatory Visit (INDEPENDENT_AMBULATORY_CARE_PROVIDER_SITE_OTHER): Payer: Medicare Other | Admitting: Adult Health

## 2023-04-28 DIAGNOSIS — J4489 Other specified chronic obstructive pulmonary disease: Secondary | ICD-10-CM

## 2023-04-28 MED ORDER — FLUTICASONE FUROATE-VILANTEROL 200-25 MCG/ACT IN AEPB: 1.0000 | INHALATION_SPRAY | Freq: Every day | RESPIRATORY_TRACT | 3 refills | Status: DC

## 2023-04-28 MED ORDER — UMECLIDINIUM BROMIDE 62.5 MCG/ACT IN AEPB
1.0000 | INHALATION_SPRAY | Freq: Every day | RESPIRATORY_TRACT | 2 refills | Status: DC
Start: 2023-04-28 — End: 2024-04-10

## 2023-04-28 NOTE — Patient Instructions (Addendum)
Finish Doxycycline and Prednisone as discussed.  Albuterol inhaler or nebulizer as needed Mucinex DM twice daily as needed for cough and congestion Continue on Breo and Incruse inhaler daily Work on not smoking.  Follow-up in 6 weeks with Dr. Delton Coombes  and As needed   Please contact office for sooner follow up if symptoms do not improve or worsen or seek emergency care
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
## 2023-04-29 NOTE — Assessment & Plan Note (Signed)
Resolving COPD flare - improving with antibiotics and steroids   Plan  Patient Instructions  Finish Doxycycline and Prednisone as discussed.  Albuterol inhaler or nebulizer as needed Mucinex DM twice daily as needed for cough and congestion Continue on Breo and Incruse inhaler daily Work on not smoking.  Follow-up in 6 weeks with Dr. Delton Coombes  and As needed   Please contact office for sooner follow up if symptoms do not improve or worsen or seek emergency care

## 2023-05-24 DIAGNOSIS — J449 Chronic obstructive pulmonary disease, unspecified: Secondary | ICD-10-CM | POA: Diagnosis not present

## 2023-05-25 ENCOUNTER — Other Ambulatory Visit: Payer: Self-pay | Admitting: Student

## 2023-05-25 ENCOUNTER — Other Ambulatory Visit: Payer: Self-pay

## 2023-05-26 ENCOUNTER — Other Ambulatory Visit: Payer: Self-pay

## 2023-05-26 MED ORDER — FLUTICASONE FUROATE-VILANTEROL 200-25 MCG/ACT IN AEPB
1.0000 | INHALATION_SPRAY | Freq: Every day | RESPIRATORY_TRACT | 3 refills | Status: DC
  Filled 2023-05-26: qty 60, 60d supply, fill #0
  Filled 2023-05-27 – 2023-07-02 (×3): qty 60, 30d supply, fill #0
  Filled 2023-07-30: qty 60, 30d supply, fill #1
  Filled 2023-10-19: qty 60, 30d supply, fill #2
  Filled 2023-11-22 – 2023-12-13 (×2): qty 60, 30d supply, fill #3

## 2023-05-27 ENCOUNTER — Other Ambulatory Visit: Payer: Self-pay

## 2023-05-27 DIAGNOSIS — Z79899 Other long term (current) drug therapy: Secondary | ICD-10-CM | POA: Diagnosis not present

## 2023-05-27 DIAGNOSIS — Z79891 Long term (current) use of opiate analgesic: Secondary | ICD-10-CM | POA: Diagnosis not present

## 2023-05-27 DIAGNOSIS — G8929 Other chronic pain: Secondary | ICD-10-CM | POA: Diagnosis not present

## 2023-05-27 DIAGNOSIS — F17210 Nicotine dependence, cigarettes, uncomplicated: Secondary | ICD-10-CM | POA: Diagnosis not present

## 2023-05-27 DIAGNOSIS — M545 Low back pain, unspecified: Secondary | ICD-10-CM | POA: Diagnosis not present

## 2023-05-27 DIAGNOSIS — R1013 Epigastric pain: Secondary | ICD-10-CM | POA: Diagnosis not present

## 2023-06-09 ENCOUNTER — Encounter: Payer: Self-pay | Admitting: Gastroenterology

## 2023-06-12 ENCOUNTER — Other Ambulatory Visit: Payer: Self-pay

## 2023-06-18 ENCOUNTER — Ambulatory Visit: Payer: Medicare Other | Admitting: Emergency Medicine

## 2023-06-18 ENCOUNTER — Telehealth: Payer: Self-pay | Admitting: Emergency Medicine

## 2023-06-18 ENCOUNTER — Other Ambulatory Visit: Payer: Self-pay

## 2023-06-18 ENCOUNTER — Encounter: Payer: Self-pay | Admitting: Emergency Medicine

## 2023-06-18 VITALS — BP 128/74 | HR 99 | Temp 98.1°F | Ht 68.0 in | Wt 171.6 lb

## 2023-06-18 DIAGNOSIS — R0781 Pleurodynia: Secondary | ICD-10-CM | POA: Diagnosis not present

## 2023-06-18 DIAGNOSIS — R9389 Abnormal findings on diagnostic imaging of other specified body structures: Secondary | ICD-10-CM | POA: Diagnosis not present

## 2023-06-18 DIAGNOSIS — J4489 Other specified chronic obstructive pulmonary disease: Secondary | ICD-10-CM

## 2023-06-18 DIAGNOSIS — F17200 Nicotine dependence, unspecified, uncomplicated: Secondary | ICD-10-CM | POA: Diagnosis not present

## 2023-06-18 LAB — D-DIMER, QUANTITATIVE: D-Dimer, Quant: 1.31 mcg/mL FEU — ABNORMAL HIGH (ref <0.50)

## 2023-06-18 NOTE — Addendum Note (Signed)
Addended by: Dorisann Frames R on: 06/18/2023 04:25 PM   Modules accepted: Orders

## 2023-06-18 NOTE — Patient Instructions (Addendum)
We reviewed your CT scan of the chest today. We will check lab work today.  Depending on that testing we will decide whether to repeat your CT scan of the chest early.  We will call you to let you know if we need to arrange for this. We should still plan to repeat your chest CT in October as planned by Dr. Roselind Messier. Continue your Virgel Bouquet as you have been taking it.  Rinse and gargle after using. Keep albuterol available to use 2 puffs if needed for shortness of breath, chest tightness, wheezing. Work to continue to decrease your cigarettes.  Try to get down to less than 10 cigarettes daily by our next visit. Follow with APP in 1 to 2 weeks. Follow Dr. Delton Coombes in October after your CT scan so we can review at that time.

## 2023-06-18 NOTE — Telephone Encounter (Signed)
Patient's D-dimer is slightly elevated, checked in the office today when he reported pleuritic type right-sided chest pain.  Suspicion for PE is low to moderate but I do believe it makes sense to check a CT-PA.  Can be urgent but not emergent.  We will order now.  I discussed the findings with him and the plan.  He understands and agrees.

## 2023-06-18 NOTE — Assessment & Plan Note (Signed)
2 months duration, etiology unclear.  No tenderness to palpation and does not sound musculoskeletal based on description.  I will check a D-dimer today.  If negative then I believe we can follow.  If positive then would recommend a CT-PA to ensure no evidence of thromboembolic disease.

## 2023-06-18 NOTE — Assessment & Plan Note (Signed)
-  Continue Breo, albuterol as needed
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
## 2023-06-18 NOTE — Assessment & Plan Note (Signed)
Discussed cutting down with him today.

## 2023-06-18 NOTE — Assessment & Plan Note (Signed)
His pulmonary nodule in the right upper lobe decreased in size and SBRT was deferred.  Plan to repeat CT chest, will be done in October with follow-up with Dr. Roselind Messier.

## 2023-06-19 ENCOUNTER — Ambulatory Visit (HOSPITAL_BASED_OUTPATIENT_CLINIC_OR_DEPARTMENT_OTHER)
Admission: RE | Admit: 2023-06-19 | Discharge: 2023-06-19 | Disposition: A | Discharge status: Home / Self Care | Payer: Medicare Other | Source: Ambulatory Visit | Attending: Emergency Medicine | Admitting: Emergency Medicine

## 2023-06-19 ENCOUNTER — Telehealth: Payer: Self-pay | Admitting: Emergency Medicine

## 2023-06-19 DIAGNOSIS — R0781 Pleurodynia: Secondary | ICD-10-CM | POA: Diagnosis not present

## 2023-06-19 LAB — POCT I-STAT CREATININE: Creatinine, Ser: 1.3 mg/dL — ABNORMAL HIGH (ref 0.61–1.24)

## 2023-06-19 MED ORDER — IOHEXOL 350 MG/ML SOLN
100.0000 mL | Freq: Once | INTRAVENOUS | Status: AC | PRN
  Administered 2023-06-19: 100 mL via INTRAVENOUS

## 2023-06-19 NOTE — Telephone Encounter (Signed)
Please let the patient know that he does not have PE on his CT chest Thanks.

## 2023-06-19 NOTE — Telephone Encounter (Signed)
Spoke with pt and reviewed CTA results as dictated by Dr. Delton Coombes. Pt stated understanding. Nothing further needed at this time.

## 2023-07-02 ENCOUNTER — Other Ambulatory Visit: Payer: Self-pay
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
## 2023-07-14 ENCOUNTER — Encounter: Payer: Self-pay | Admitting: Acute Care

## 2023-07-14 ENCOUNTER — Ambulatory Visit: Payer: Medicare Other | Admitting: Acute Care

## 2023-07-14 ENCOUNTER — Other Ambulatory Visit (HOSPITAL_COMMUNITY): Payer: Self-pay

## 2023-07-14 ENCOUNTER — Other Ambulatory Visit: Payer: Self-pay | Admitting: Acute Care

## 2023-07-14 VITALS — BP 120/76 | HR 91 | Ht 68.0 in | Wt 172.6 lb

## 2023-07-14 DIAGNOSIS — R918 Other nonspecific abnormal finding of lung field: Secondary | ICD-10-CM

## 2023-07-14 DIAGNOSIS — R591 Generalized enlarged lymph nodes: Secondary | ICD-10-CM

## 2023-07-14 DIAGNOSIS — F17210 Nicotine dependence, cigarettes, uncomplicated: Secondary | ICD-10-CM

## 2023-07-14 DIAGNOSIS — J4489 Other specified chronic obstructive pulmonary disease: Secondary | ICD-10-CM

## 2023-07-14 DIAGNOSIS — J45909 Unspecified asthma, uncomplicated: Secondary | ICD-10-CM | POA: Diagnosis not present

## 2023-07-14 MED ORDER — ALBUTEROL SULFATE HFA 108 (90 BASE) MCG/ACT IN AERS
2.0000 | INHALATION_SPRAY | Freq: Four times a day (QID) | RESPIRATORY_TRACT | 6 refills | Status: AC | PRN
Start: 2023-07-14 — End: ?
  Filled 2023-07-14: qty 6.7, 25d supply, fill #0
  Filled 2023-07-15 – 2024-03-24 (×2): qty 26.8, 100d supply, fill #0

## 2023-07-14 NOTE — Patient Instructions (Addendum)
It is good to see you today. I am glad you are feeling better.  We will move your October CT Chest to [facility] September to re-evaluate the slight progression of the left adenopathy we saw on the 06/2023 scan.  Continue Breo once daily as you have been doing. Rinse mouth after use.  Albuterol as needed for breakthrough shortness of breath or wheezing. ( Nebs or inhaler)  I have sent in a new prescription for this.  Follow up after September CT Chest with Dr. Delton Coombes to review results. If this scan is stable consider Lung cancer screening. Please work on quitting smoking.  Call 1-800-QUIT NOW for free nicotine patches, gum or mints to help you quit smoking.  Please contact office for sooner follow up if symptoms do not improve or worsen or seek emergency care

## 2023-07-15 ENCOUNTER — Other Ambulatory Visit (HOSPITAL_COMMUNITY): Payer: Self-pay

## 2023-07-15 ENCOUNTER — Other Ambulatory Visit: Payer: Self-pay

## 2023-07-24 DIAGNOSIS — J449 Chronic obstructive pulmonary disease, unspecified: Secondary | ICD-10-CM | POA: Diagnosis not present

## 2023-07-28 DIAGNOSIS — F17210 Nicotine dependence, cigarettes, uncomplicated: Secondary | ICD-10-CM | POA: Diagnosis not present

## 2023-07-28 DIAGNOSIS — R1013 Epigastric pain: Secondary | ICD-10-CM | POA: Diagnosis not present

## 2023-07-28 DIAGNOSIS — G8929 Other chronic pain: Secondary | ICD-10-CM | POA: Diagnosis not present

## 2023-07-28 DIAGNOSIS — Z79891 Long term (current) use of opiate analgesic: Secondary | ICD-10-CM | POA: Diagnosis not present

## 2023-07-28 DIAGNOSIS — M545 Low back pain, unspecified: Secondary | ICD-10-CM | POA: Diagnosis not present

## 2023-07-28 DIAGNOSIS — Z79899 Other long term (current) drug therapy: Secondary | ICD-10-CM | POA: Diagnosis not present

## 2023-07-30 ENCOUNTER — Other Ambulatory Visit: Payer: Self-pay

## 2023-07-31 ENCOUNTER — Other Ambulatory Visit: Payer: Self-pay

## 2023-07-31 DIAGNOSIS — Z79899 Other long term (current) drug therapy: Secondary | ICD-10-CM | POA: Diagnosis not present

## 2023-08-06 ENCOUNTER — Other Ambulatory Visit: Payer: Self-pay | Admitting: Critical Care Medicine

## 2023-08-08 ENCOUNTER — Emergency Department (HOSPITAL_COMMUNITY): Payer: Medicare Other

## 2023-08-08 ENCOUNTER — Emergency Department (HOSPITAL_COMMUNITY)
Admission: EM | Admit: 2023-08-08 | Discharge: 2023-08-08 | Disposition: A | Discharge status: Home / Self Care | Payer: Medicare Other | Attending: Student | Admitting: Student

## 2023-08-08 ENCOUNTER — Encounter (HOSPITAL_COMMUNITY): Payer: Self-pay

## 2023-08-08 ENCOUNTER — Other Ambulatory Visit: Payer: Self-pay

## 2023-08-08 DIAGNOSIS — R059 Cough, unspecified: Secondary | ICD-10-CM | POA: Diagnosis not present

## 2023-08-08 DIAGNOSIS — F17210 Nicotine dependence, cigarettes, uncomplicated: Secondary | ICD-10-CM | POA: Insufficient documentation

## 2023-08-08 DIAGNOSIS — R918 Other nonspecific abnormal finding of lung field: Secondary | ICD-10-CM | POA: Diagnosis not present

## 2023-08-08 DIAGNOSIS — J189 Pneumonia, unspecified organism: Secondary | ICD-10-CM

## 2023-08-08 DIAGNOSIS — J181 Lobar pneumonia, unspecified organism: Secondary | ICD-10-CM | POA: Diagnosis not present

## 2023-08-08 DIAGNOSIS — R0602 Shortness of breath: Secondary | ICD-10-CM | POA: Diagnosis not present

## 2023-08-08 DIAGNOSIS — R079 Chest pain, unspecified: Secondary | ICD-10-CM | POA: Diagnosis not present

## 2023-08-08 DIAGNOSIS — J449 Chronic obstructive pulmonary disease, unspecified: Secondary | ICD-10-CM | POA: Diagnosis not present

## 2023-08-08 LAB — BASIC METABOLIC PANEL
Anion gap: 9 (ref 5–15)
BUN: 7 mg/dL (ref 6–20)
CO2: 24 mmol/L (ref 22–32)
Calcium: 9.2 mg/dL (ref 8.9–10.3)
Chloride: 104 mmol/L (ref 98–111)
Creatinine, Ser: 1.07 mg/dL (ref 0.61–1.24)
GFR, Estimated: >60 mL/min (ref >60)
Glucose, Bld: 108 mg/dL — ABNORMAL HIGH (ref 70–99)
Potassium: 3.9 mmol/L (ref 3.5–5.1)
Sodium: 137 mmol/L (ref 135–145)

## 2023-08-08 LAB — CBC
HCT: 45.5 % (ref 39.0–52.0)
Hemoglobin: 15.2 g/dL (ref 13.0–17.0)
MCH: 28.6 pg (ref 26.0–34.0)
MCHC: 33.4 g/dL (ref 30.0–36.0)
MCV: 85.7 fL (ref 80.0–100.0)
Platelets: 346 10*3/uL (ref 150–400)
RBC: 5.31 MIL/uL (ref 4.22–5.81)
RDW: 14.7 % (ref 11.5–15.5)
WBC: 10 10*3/uL (ref 4.0–10.5)
nRBC: 0 % (ref 0.0–0.2)

## 2023-08-08 LAB — TROPONIN I (HIGH SENSITIVITY): Troponin I (High Sensitivity): 6 ng/L (ref <18)

## 2023-08-08 MED ORDER — AMOXICILLIN-POT CLAVULANATE 875-125 MG PO TABS: 1.0000 | ORAL_TABLET | Freq: Two times a day (BID) | ORAL | 0 refills | Status: DC

## 2023-08-08 MED ORDER — AMOXICILLIN-POT CLAVULANATE 875-125 MG PO TABS
1.0000 | ORAL_TABLET | Freq: Once | ORAL | Status: AC
  Administered 2023-08-08: 1 via ORAL
  Filled 2023-08-08: qty 1

## 2023-08-08 MED ORDER — IPRATROPIUM-ALBUTEROL 0.5-2.5 (3) MG/3ML IN SOLN
9.0000 mL | Freq: Once | RESPIRATORY_TRACT | Status: AC
  Administered 2023-08-08: 9 mL via RESPIRATORY_TRACT
  Filled 2023-08-08: qty 9

## 2023-08-08 MED ORDER — DOXYCYCLINE HYCLATE 100 MG PO TABS
100.0000 mg | ORAL_TABLET | Freq: Once | ORAL | Status: AC
  Administered 2023-08-08: 100 mg via ORAL
  Filled 2023-08-08: qty 1

## 2023-08-08 MED ORDER — DOXYCYCLINE HYCLATE 100 MG PO CAPS: 100.0000 mg | ORAL_CAPSULE | Freq: Two times a day (BID) | ORAL | 0 refills | Status: DC

## 2023-08-08 NOTE — ED Provider Notes (Signed)
[place] EMERGENCY DEPARTMENT AT [facility] Provider Note   CSN: [id number] Arrival date & time: 08/08/23  1103     History  Chief Complaint  Patient presents with   Chest Pain    Wesley [name] is a 57 y.o. male.  Patient to ED with chest pain he reports as related to cough that has been getting worse over the last week. He feels SOB with exertion. No fever. History of COPD, continuous tobacco use. He denies fever, nausea, vomiting. He states he was seen about a month ago by his pulmonologist and given an antibiotic for similar symptoms and got better. Symptoms recurred one week ago.   The history is provided by the patient. No language interpreter was used.  Chest Pain      Home Medications Prior to Admission medications   Medication Sig Start Date End Date Taking? Authorizing Provider  amoxicillin-clavulanate (AUGMENTIN) 875-125 MG tablet Take 1 tablet by mouth every 12 (twelve) hours. 08/08/23  Yes Yadira Hada, Melvenia Beam, PA-C  doxycycline (VIBRAMYCIN) 100 MG capsule Take 1 capsule (100 mg total) by mouth 2 (two) times daily. 08/08/23  Yes Elpidio Anis, PA-C  acetaminophen (TYLENOL) 500 MG tablet Take 2 tablets (1,000 mg total) by mouth every 8 (eight) hours as needed for moderate pain. 09/17/22   Rocky Morel, DO  albuterol (PROVENTIL) (2.5 MG/3ML) 0.083% nebulizer solution Take 3 mLs (2.5 mg total) by nebulization every 6 (six) hours as needed for wheezing or shortness of breath. 04/22/23 04/21/24  Parrett, Virgel Bouquet, NP  albuterol (VENTOLIN HFA) 108 (90 Base) MCG/ACT inhaler Inhale 2 puffs into the lungs every 6 (six) hours as needed for wheezing or shortness of breath. 11/05/21   Storm Frisk, MD  albuterol (VENTOLIN HFA) 108 (90 Base) MCG/ACT inhaler Inhale 2 puffs into the lungs every 6 (six) hours as needed for wheezing or shortness of breath. 07/14/23   Bevelyn Ngo, NP  amitriptyline ([name]) 50 MG tablet Take 1 tablet (50 mg total) by mouth at bedtime. 01/30/23  01/25/24  Cirigliano, Vito V, DO  baclofen (LIORESAL) 10 MG tablet Take 0.5-1 tablets (5-10 mg total) by mouth every 8 (eight) hours as needed. Patient taking differently: Take 5-10 mg by mouth every 8 (eight) hours as needed for muscle spasms. 03/20/22   Storm Frisk, MD  DULoxetine (CYMBALTA) 60 MG capsule TAKE 1 CAPSULE BY MOUTH DAILY 10/24/22 10/24/23  Storm Frisk, MD  famotidine (PEPCID) 20 MG tablet TAKE 1 TABLET BY MOUTH TWICE  DAILY 01/22/23   Rocky Morel, DO  fluticasone furoate-vilanterol (BREO ELLIPTA) 200-25 MCG/ACT AEPB Inhale 1 puff into the lungs daily. (Must have office visit for refills) 04/28/23   Parrett, Virgel Bouquet, NP  fluticasone furoate-vilanterol (BREO ELLIPTA) 200-25 MCG/ACT AEPB Inhale 1 puff into the lungs daily. (Must have office visit for refills) 05/26/23   Rocky Morel, DO  lidocaine (LIDODERM) 5 % Place 1 patch onto the skin 2 (two) times a week. Remove & Discard patch within 12 hours or as directed by MD 10/27/22   Leslye Peer, MD  lipase/protease/amylase ([name]) 36000 UNITS CPEP capsule Take 2 capsules (72,000 Units total) by mouth 3 (three) times daily with meals AND 1 capsule (36,000 Units total) with snacks. Max: 9 capsules per [place]. 05/28/22   Storm Frisk, MD  Multiple Vitamin (MULTIVITAMIN WITH MINERALS) TABS tablet Take 1 tablet by mouth daily.    [provider]  ondansetron (ZOFRAN) 4 MG tablet Take 1 tablet (4 mg  total) by mouth every 8 (eight) hours as needed for nausea or vomiting. 11/12/22 11/12/23  Doree Albee, PA-C  oxycodone (ROXICODONE) 30 MG immediate release tablet Take 15 mg by mouth 2 (two) times daily. 09/20/22   [provider]  pantoprazole (PROTONIX) 40 MG tablet TAKE 1 TABLET BY MOUTH TWICE  DAILY BEFORE A MEAL 12/09/22   Storm Frisk, MD  polyethylene glycol (MIRALAX / GLYCOLAX) 17 g packet Take 17 g by mouth daily as needed (constipation).    [provider]  predniSONE (DELTASONE) 10 MG  tablet 4 tabs for 2 days, then 3 tabs for 2 days, 2 tabs for 2 days, then 1 tab for 2 days, then stop 04/22/23   Parrett, Tammy S, NP  pregabalin (LYRICA) 150 MG capsule Take 1 capsule (150 mg total) by mouth in the morning, at noon, and at bedtime. 01/22/23 08/31/23  Rocky Morel, DO  senna (SENOKOT) 8.6 MG TABS tablet Take 1 tablet (8.6 mg total) by mouth daily. 08/17/22   Steffanie Rainwater, MD  tamsulosin (FLOMAX) 0.4 MG CAPS capsule TAKE 1 CAPSULE BY MOUTH DAILY 03/02/23   Storm Frisk, MD  umeclidinium bromide (INCRUSE ELLIPTA) 62.5 MCG/ACT AEPB Inhale 1 puff into the lungs daily. 04/28/23   Parrett, Virgel Bouquet, NP  vitamin B-12 (CYANOCOBALAMIN) 1000 MCG tablet Take 1 tablet (1,000 mcg total) by mouth daily. 03/20/22   Storm Frisk, MD  Vitamin D, Ergocalciferol, (DRISDOL) 1.25 MG (50000 UNIT) CAPS capsule Take 1 capsule (50,000 Units total) by mouth every 7 (seven) days. Patient taking differently: Take 50,000 Units by mouth every Tuesday. 03/20/22   Storm Frisk, MD      Allergies    Patient has no known allergies.    Review of Systems   Review of Systems  Cardiovascular:  Positive for chest pain.    Physical Exam Updated Vital Signs BP 139/84 (BP Location: Left Arm)   Pulse 90   Temp 98 F (36.7 C) (Oral)   Resp 18   Ht 5\' 8"  (1.727 m)   Wt 78.5 kg   SpO2 100%   BMI 26.30 kg/m  Physical Exam Vitals and nursing note reviewed.  Constitutional:      Appearance: He is well-developed.  Cardiovascular:     Rate and Rhythm: Normal rate and regular rhythm.     Heart sounds: No murmur heard. Pulmonary:     Effort: Pulmonary effort is normal.     Breath sounds: Examination of the right-middle field reveals wheezing. Examination of the left-middle field reveals wheezing. Examination of the right-lower field reveals wheezing. Examination of the left-lower field reveals wheezing. Wheezing (Expiratory wheezes, mild) present.     Comments: Good air movement.  Chest:      Chest wall: Tenderness present.  Abdominal:     Palpations: Abdomen is soft.  Musculoskeletal:     Cervical back: Normal range of motion.  Skin:    General: Skin is warm and dry.  Neurological:     Mental Status: He is alert and oriented to person, place, and time.     ED Results / Procedures / Treatments   Labs (all labs ordered are listed, but only abnormal results are displayed) Labs Reviewed  BASIC METABOLIC PANEL - Abnormal; Notable for the following components:      Result Value   Glucose, Bld 108 (*)    All other components within normal limits  CBC  TROPONIN I (HIGH SENSITIVITY)  TROPONIN I (HIGH SENSITIVITY)  Results for orders placed or performed during the hospital encounter of 08/08/23 (from the past 24 hour(s))  Basic metabolic panel     Status: Abnormal   Collection Time: 08/08/23 11:25 AM  Result Value Ref Range   Sodium 137 135 - 145 mmol/L   Potassium 3.9 3.5 - 5.1 mmol/L   Chloride 104 98 - 111 mmol/L   CO2 24 22 - 32 mmol/L   Glucose, Bld 108 (H) 70 - 99 mg/dL   BUN 7 6 - 20 mg/dL   Creatinine, Ser 9.60 0.61 - 1.24 mg/dL   Calcium 9.2 8.9 - 45.4 mg/dL   GFR, Estimated >09 >81 mL/min   Anion gap 9 5 - 15  CBC     Status: None   Collection Time: 08/08/23 11:25 AM  Result Value Ref Range   WBC 10.0 4.0 - 10.5 K/uL   RBC 5.31 4.22 - 5.81 MIL/uL   Hemoglobin 15.2 13.0 - 17.0 g/dL   HCT 19.1 47.8 - 29.5 %   MCV 85.7 80.0 - 100.0 fL   MCH 28.6 26.0 - 34.0 pg   MCHC 33.4 30.0 - 36.0 g/dL   RDW 62.1 30.8 - 65.7 %   Platelets 346 150 - 400 K/uL   nRBC 0.0 0.0 - 0.2 %  Troponin I (High Sensitivity)     Status: None   Collection Time: 08/08/23 11:25 AM  Result Value Ref Range   Troponin I (High Sensitivity) 6 <18 ng/L    EKG None  Radiology DG Chest 2 View  Result Date: 08/08/2023 CLINICAL DATA:  Shortness of breath, cough and right-sided chest pain. EXAM: CHEST - 2 VIEW COMPARISON:  October 31, 2022 FINDINGS: Normal cardiac silhouette. Mild  tortuosity of the aorta. Upper lobe predominant emphysematous changes with prominent bulla in the right upper lobe, not significantly changed from before. Streaky bibasilar airspace opacities, left greater than right. IMPRESSION: 1. Streaky bibasilar airspace opacities, left greater than right, may represent atelectasis or pneumonia. 2. Upper lobe predominant emphysematous changes with prominent bulla in the right upper lobe, not significantly changed from before. Electronically Signed   By: Ted Mcalpine M.D.   On: 08/08/2023 13:07    Procedures Procedures    Medications Ordered in ED Medications  ipratropium-albuterol (DUONEB) 0.5-2.5 (3) MG/3ML nebulizer solution 9 mL (9 mLs Nebulization Given 08/08/23 1329)  amoxicillin-clavulanate (AUGMENTIN) 875-125 MG per tablet 1 tablet (1 tablet Oral Given 08/08/23 1328)  doxycycline (VIBRA-TABS) tablet 100 mg (100 mg Oral Given 08/08/23 1328)    ED Course/ Medical Decision Making/ A&P Clinical Course as of 08/08/23 1354  Sat Aug 08, 2023  1321 Patient with COPD, continuous ppd smoker, to ED with painful cough and SOB this week. No fever. He is well appearing on exam, VSS, afebrile. No hypoxia or sign of sepsis. Labs unremarkable for sign of infection. CXR supports bilateral infiltrates.  [SU]  1324 Discussed with Dr. Posey Rea. Duoneb provided in ED. Will start dual antibiotic treatment with Augmentin and Doxycycline. Tussionex for cough. Encourage pulmonary follow up in one week for recheck - return to ED with worsening symptoms.  [SU]    Clinical Course User Index [SU] Elpidio Anis, PA-C                                 Medical Decision Making Amount and/or Complexity of Data Reviewed Labs: ordered. Radiology: ordered.  Risk Prescription drug management.  Final Clinical Impression(s) / ED Diagnoses Final diagnoses:  Pneumonia of both lower lobes due to infectious organism    Rx / DC Orders ED Discharge Orders           Ordered    doxycycline (VIBRAMYCIN) 100 MG capsule  2 times daily        08/08/23 1352    amoxicillin-clavulanate (AUGMENTIN) 875-125 MG tablet  Every 12 hours        08/08/23 1352              Elpidio Anis, PA-C 08/08/23 1354    Kommor, [place], MD 08/08/23 1842

## 2023-08-08 NOTE — ED Triage Notes (Signed)
Pt came in via POV d/t a bad cough the last week & a half & it causes chest pain primarily on the Rt side of his chest. A/Ox4, denies fevers, does endorse some difficulty breathing. Hx of COPD, rates his pain 8/10 while in triage.

## 2023-08-08 NOTE — Discharge Instructions (Addendum)
Take the antibiotics until finished. Continue your nebulizer and inhaler use for cough and any shortness of breath.  Return to the emergency department with any worsening symptoms or new concerns.

## 2023-08-17 ENCOUNTER — Ambulatory Visit (HOSPITAL_BASED_OUTPATIENT_CLINIC_OR_DEPARTMENT_OTHER)
Admission: RE | Admit: 2023-08-17 | Discharge: 2023-08-17 | Disposition: A | Discharge status: Home / Self Care | Payer: Medicare Other | Source: Ambulatory Visit | Attending: Acute Care | Admitting: Acute Care

## 2023-08-17 DIAGNOSIS — J439 Emphysema, unspecified: Secondary | ICD-10-CM | POA: Diagnosis not present

## 2023-08-17 DIAGNOSIS — C3490 Malignant neoplasm of unspecified part of unspecified bronchus or lung: Secondary | ICD-10-CM | POA: Diagnosis not present

## 2023-08-17 DIAGNOSIS — I700 Atherosclerosis of aorta: Secondary | ICD-10-CM | POA: Diagnosis not present

## 2023-08-17 DIAGNOSIS — R591 Generalized enlarged lymph nodes: Secondary | ICD-10-CM | POA: Diagnosis not present

## 2023-08-21 ENCOUNTER — Other Ambulatory Visit: Payer: Self-pay | Admitting: Critical Care Medicine

## 2023-08-21 DIAGNOSIS — R110 Nausea: Secondary | ICD-10-CM

## 2023-08-28 DIAGNOSIS — F17210 Nicotine dependence, cigarettes, uncomplicated: Secondary | ICD-10-CM | POA: Diagnosis not present

## 2023-08-28 DIAGNOSIS — G8929 Other chronic pain: Secondary | ICD-10-CM | POA: Diagnosis not present

## 2023-08-28 DIAGNOSIS — Z79899 Other long term (current) drug therapy: Secondary | ICD-10-CM | POA: Diagnosis not present

## 2023-08-28 DIAGNOSIS — R1013 Epigastric pain: Secondary | ICD-10-CM | POA: Diagnosis not present

## 2023-08-28 DIAGNOSIS — Z79891 Long term (current) use of opiate analgesic: Secondary | ICD-10-CM | POA: Diagnosis not present

## 2023-08-28 DIAGNOSIS — M545 Low back pain, unspecified: Secondary | ICD-10-CM | POA: Diagnosis not present

## 2023-09-01 ENCOUNTER — Ambulatory Visit: Payer: Medicare Other | Admitting: Student

## 2023-09-01 ENCOUNTER — Encounter: Payer: Self-pay | Admitting: Student

## 2023-09-01 VITALS — BP 137/74 | HR 82 | Temp 98.8°F | Ht 68.0 in | Wt 173.6 lb

## 2023-09-01 DIAGNOSIS — R911 Solitary pulmonary nodule: Secondary | ICD-10-CM | POA: Diagnosis not present

## 2023-09-01 DIAGNOSIS — R110 Nausea: Secondary | ICD-10-CM

## 2023-09-01 DIAGNOSIS — R9389 Abnormal findings on diagnostic imaging of other specified body structures: Secondary | ICD-10-CM | POA: Diagnosis not present

## 2023-09-01 DIAGNOSIS — J4489 Other specified chronic obstructive pulmonary disease: Secondary | ICD-10-CM | POA: Diagnosis not present

## 2023-09-01 DIAGNOSIS — M542 Cervicalgia: Secondary | ICD-10-CM

## 2023-09-01 DIAGNOSIS — Z23 Encounter for immunization: Secondary | ICD-10-CM | POA: Diagnosis not present

## 2023-09-01 MED ORDER — ONDANSETRON HCL 4 MG PO TABS
4.0000 mg | ORAL_TABLET | Freq: Three times a day (TID) | ORAL | 1 refills | Status: AC | PRN
Start: 2023-09-01 — End: 2024-08-31
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
## 2023-09-01 NOTE — Patient Instructions (Addendum)
  Thank you, Wesley [name], for allowing [place] to provide your care today.  For your neck pain I would like you to use Tylenol 1000 mg every 8 hours for the pain as well as the baclofen that you have at home.  You can use heating pad or warm compress over the area as needed as well as topical therapy such as Voltaren gel which you can get at the pharmacy or lidocaine patches which you could also get at the pharmacy.  I am going to send a message to Dr. Roselind Messier about your pain and see if they are planning to do any extra imaging.  If this pain does not improve with the medicines and topical therapies above we will consider getting an MRI to further evaluate.    I have ordered the following medication/changed the following medications:   Start the following medications: Meds ordered this encounter  Medications   ondansetron (ZOFRAN) 4 MG tablet    Sig: Take 1 tablet (4 mg total) by mouth every 8 (eight) hours as needed for nausea or vomiting.    Dispense:  60 tablet    Refill:  1      Follow up: 3-4 months    Remember:     Should you have any questions or concerns please call the internal medicine clinic at [phone].     Wesley Morel, DO [facility]

## 2023-09-03 ENCOUNTER — Telehealth: Payer: Self-pay | Admitting: *Deleted

## 2023-09-03 DIAGNOSIS — M542 Cervicalgia: Secondary | ICD-10-CM | POA: Insufficient documentation

## 2023-09-03 NOTE — Telephone Encounter (Signed)
Called patient to inform of Ct for 09-04-23- arrival time- 4:15 pm @ [facility], no restrictions to scan, patient will receive results from Dr. Roselind Messier on 09-07-23 @ 10 am, spoke with patient and he is aware of these appts.and the instructions

## 2023-09-03 NOTE — Assessment & Plan Note (Signed)
Patient recently in the ED on 08/08/2023 and treated for community-acquired pneumonia after having some bibasilar airspace opacities on chest x-ray and complaints of cough and chest pain.  Treated with a course of Augmentin and doxycycline.  Since then he has not had issues with the cough.  Lung sounds today without significant wheezing, or focal abnormalities.  He follows with pulmonology and will continue to do so.

## 2023-09-03 NOTE — Assessment & Plan Note (Signed)
Today patient complains of left lateral neck and left lateral head pain that has been going on for couple of months.  Turning his head makes the pain worse and he is taking high-dose aspirin for this without significant benefit.  On exam he does not have any cervical spinal tenderness but has paraspinal musculature and left trapezius muscle tenderness and tightness.  This is most consistent with musculoskeletal strain but with his questionable malignancy and chronicity we considered getting MRI.  However he is planning to see radiation oncology next week who will be evaluating him for these increasing right upper lobe nodules which may include some imaging.  I will message his radiation oncologist about his symptoms to see if we need to add on any imaging or if there imaging will be sufficient.  He has baclofen at home that he will try as well as conservative measures with stretching, heat, and other topical therapies.

## 2023-09-03 NOTE — Assessment & Plan Note (Signed)
CT of the chest on 08/17/2023 showed progressive right upper lobe nodules and left IMA nodes suspicious for nodal metastasis.  He follows with pulmonology and radiation oncology.  Last saw pulmonology 07/14/2023.  I will see radiation oncology on 09/07/2023.  He has previously had a biopsy of his right upper lobe nodules that was negative for cancer but in the meantime has had progression as above. - Continue with pulmonology and radiation oncology follow-up

## 2023-09-04 ENCOUNTER — Ambulatory Visit (HOSPITAL_COMMUNITY)
Admission: RE | Admit: 2023-09-04 | Discharge: 2023-09-04 | Disposition: A | Discharge status: Home / Self Care | Payer: Medicare Other | Source: Ambulatory Visit | Attending: Radiation Oncology | Admitting: Radiation Oncology

## 2023-09-04 DIAGNOSIS — R590 Localized enlarged lymph nodes: Secondary | ICD-10-CM | POA: Diagnosis not present

## 2023-09-04 DIAGNOSIS — J90 Pleural effusion, not elsewhere classified: Secondary | ICD-10-CM | POA: Diagnosis not present

## 2023-09-04 DIAGNOSIS — J439 Emphysema, unspecified: Secondary | ICD-10-CM | POA: Diagnosis not present

## 2023-09-04 DIAGNOSIS — C3490 Malignant neoplasm of unspecified part of unspecified bronchus or lung: Secondary | ICD-10-CM | POA: Diagnosis not present

## 2023-09-04 MED ORDER — IOHEXOL 300 MG/ML  SOLN
75.0000 mL | Freq: Once | INTRAMUSCULAR | Status: AC | PRN
  Administered 2023-09-04: 75 mL via INTRAVENOUS

## 2023-09-04 NOTE — Progress Notes (Signed)
Internal Medicine Clinic Attending  Case discussed with the resident at the time of the visit.  We reviewed the resident's history and exam and pertinent patient test results.  I agree with the assessment, diagnosis, and plan of care documented in the resident's note.  

## 2023-09-05 NOTE — Progress Notes (Signed)
Radiation Oncology         [phone] ________________________________  Name: Wesley [name] Maraj MRN: [id number]  Date: 09/07/2023  DOB: Apr 24, 1966  Follow-Up Visit Note  CC: Rocky Morel, DO  Leslye Peer, MD  No diagnosis found.  Diagnosis:  The primary encounter diagnosis was Malignant neoplasm of right upper lobe of lung (HCC). A diagnosis of Mediastinal adenopathy was also pertinent to this visit.   Enlarging hypermetabolic RUL pulmonary nodule suspicious for malignancy (negative nodal biopsies)   Narrative:  The patient returns today for routine follow-up and to review recent imaging. He was last seen here for re-evaluation on 03/05/23. To review from his last visit, his most recent CT of chest at that time showed a decrease in size of the suspected RUL pulmonary nodule and interval improvement of the previously hypermetabolic internal mammary lymph node. Based on these findings and prior negative biopsies, malignancy was deemed unlikely and I recommended that he proceed with repeat imaging in 6 months.   In the interval since his last visit, the patient presented to Dr. Delton Coombes this past July with c/o chest pain and SOB x 2 months. A CTA of the chest was subsequently performed on 06/19/23 which showed possible minimal progression of left internal mammary adenopathy. CT otherwise showed no change in size of right upper lobe pulmonary nodule and no evidence of PE. He was given a course of antibiotics by pulmonology in August with improvement in his symptoms.   He however presented to the ED on 08/08/23 with 1 week of progressive chest pain and associated cough. A chest CT without contrast was performed which showed interval progression of the RUL nodules, measuring 13 x 10 mm and 6 x 16 mm, previously corresponding to a single 9 x 6 mm nodule. Mild interval progression of the left internal mammary node was also demonstrated, suspicious for nodal mets. A chest x-ray was also performed which  showed evidence of possible PNA. He was subsequently discharged with a course of doxycycline and Augmentin.   His most recent chest CT with contrast performed on 09/04/23 demonstrates: ***.   ***                           Allergies:  has No Known Allergies.  Meds: Current Outpatient Medications  Medication Sig Dispense Refill   acetaminophen (TYLENOL) 500 MG tablet Take 2 tablets (1,000 mg total) by mouth every 8 (eight) hours as needed for moderate pain. 120 tablet 0   albuterol (PROVENTIL) (2.5 MG/3ML) 0.083% nebulizer solution Take 3 mLs (2.5 mg total) by nebulization every 6 (six) hours as needed for wheezing or shortness of breath. 75 mL 5   albuterol (VENTOLIN HFA) 108 (90 Base) MCG/ACT inhaler Inhale 2 puffs into the lungs every 6 (six) hours as needed for wheezing or shortness of breath. 8.5 g 0   albuterol (VENTOLIN HFA) 108 (90 Base) MCG/ACT inhaler Inhale 2 puffs into the lungs every 6 (six) hours as needed for wheezing or shortness of breath. 33.5 g 6   amitriptyline ([name]) 50 MG tablet Take 1 tablet (50 mg total) by mouth at bedtime. 90 tablet 3   amoxicillin-clavulanate (AUGMENTIN) 875-125 MG tablet Take 1 tablet by mouth every 12 (twelve) hours. 20 tablet 0   baclofen (LIORESAL) 10 MG tablet Take 0.5-1 tablets (5-10 mg total) by mouth every 8 (eight) hours as needed. (Patient taking differently: Take 5-10 mg by mouth every 8 (eight) hours as  needed for muscle spasms.) 60 each 4   doxycycline (VIBRAMYCIN) 100 MG capsule Take 1 capsule (100 mg total) by mouth 2 (two) times daily. 20 capsule 0   DULoxetine (CYMBALTA) 60 MG capsule TAKE 1 CAPSULE BY MOUTH DAILY 90 capsule 1   famotidine (PEPCID) 20 MG tablet TAKE 1 TABLET BY MOUTH TWICE  DAILY 200 tablet 2   fluticasone furoate-vilanterol (BREO ELLIPTA) 200-25 MCG/ACT AEPB Inhale 1 puff into the lungs daily. (Must have office visit for refills) 60 each 3   fluticasone furoate-vilanterol (BREO ELLIPTA) 200-25 MCG/ACT AEPB Inhale 1  puff into the lungs daily. (Must have office visit for refills) 60 each 3   lidocaine (LIDODERM) 5 % Place 1 patch onto the skin 2 (two) times a week. Remove & Discard patch within 12 hours or as directed by MD     lipase/protease/amylase ([name]) 36000 UNITS CPEP capsule Take 2 capsules (72,000 Units total) by mouth 3 (three) times daily with meals AND 1 capsule (36,000 Units total) with snacks. Max: 9 capsules per [place]. 270 capsule 2   Multiple Vitamin (MULTIVITAMIN WITH MINERALS) TABS tablet Take 1 tablet by mouth daily.     ondansetron (ZOFRAN) 4 MG tablet Take 1 tablet (4 mg total) by mouth every 8 (eight) hours as needed for nausea or vomiting. 60 tablet 1   oxycodone (ROXICODONE) 30 MG immediate release tablet Take 15 mg by mouth 2 (two) times daily.     pantoprazole (PROTONIX) 40 MG tablet TAKE 1 TABLET BY MOUTH TWICE  DAILY BEFORE A MEAL 200 tablet 2   polyethylene glycol (MIRALAX / GLYCOLAX) 17 g packet Take 17 g by mouth daily as needed (constipation).     predniSONE (DELTASONE) 10 MG tablet 4 tabs for 2 days, then 3 tabs for 2 days, 2 tabs for 2 days, then 1 tab for 2 days, then stop 20 tablet 0   pregabalin (LYRICA) 150 MG capsule Take 1 capsule (150 mg total) by mouth in the morning, at noon, and at bedtime. 90 capsule 6   senna (SENOKOT) 8.6 MG TABS tablet Take 1 tablet (8.6 mg total) by mouth daily. 90 tablet 1   tamsulosin (FLOMAX) 0.4 MG CAPS capsule TAKE 1 CAPSULE BY MOUTH DAILY 100 capsule 1   umeclidinium bromide (INCRUSE ELLIPTA) 62.5 MCG/ACT AEPB Inhale 1 puff into the lungs daily. 30 each 2   vitamin B-12 (CYANOCOBALAMIN) 1000 MCG tablet Take 1 tablet (1,000 mcg total) by mouth daily. 60 tablet 4   Vitamin D, Ergocalciferol, (DRISDOL) 1.25 MG (50000 UNIT) CAPS capsule Take 1 capsule (50,000 Units total) by mouth every 7 (seven) days. (Patient taking differently: Take 50,000 Units by mouth every Tuesday.) 14 capsule 3   No current facility-administered medications for this  encounter.    Physical Findings: The patient is in no acute distress. Patient is alert and oriented.  vitals were not taken for this visit. .  No significant changes. Lungs are clear to auscultation bilaterally. Heart has regular rate and rhythm. No palpable cervical, supraclavicular, or axillary adenopathy. Abdomen soft, non-tender, normal bowel sounds.   Lab Findings: Lab Results  Component Value Date   WBC 10.0 08/08/2023   HGB 15.2 08/08/2023   HCT 45.5 08/08/2023   MCV 85.7 08/08/2023   PLT 346 08/08/2023    Radiographic Findings: CT CHEST WO CONTRAST  Result Date: 08/31/2023 CLINICAL DATA:  Non-small cell lung cancer EXAM: CT CHEST WITHOUT CONTRAST TECHNIQUE: Multidetector CT imaging of the chest was performed following the standard  protocol without IV contrast. RADIATION DOSE REDUCTION: This exam was performed according to the departmental dose-optimization program which includes automated exposure control, adjustment of the mA and/or kV according to patient size and/or use of iterative reconstruction technique. COMPARISON:  CTA chest dated 06/19/2023 FINDINGS: Cardiovascular: The heart is normal in size. No pericardial effusion. No evidence of thoracic aortic aneurysm. Mild atherosclerotic calcifications of the aortic arch. Mild coronary atherosclerosis of the LAD. Mediastinum/Nodes: Small mediastinal nodes, including 11 mm short axis low right paratracheal node (series 2/image 54), similar to the prior. However, the 9 mm short axis hypermetabolic left IMA node is mildly progressive, previously 8 mm. Visualized thyroid is unremarkable. Lungs/Pleura: Moderate centrilobular and paraseptal emphysematous changes, right upper lobe predominant. Two posterior right upper lobe nodules associated with bullous changes, measuring 13 x 10 mm (series 4/image 29) and 6 x 16 mm (series 4/image 86). These are progressive, corresponding to a single prior 9 x 6 mm nodule. Stable calcified granuloma in the  left lower lobe (series 4/image 105), benign. No new/suspicious pulmonary nodules. No focal consolidation. No pleural effusion or pneumothorax. Upper Abdomen: Stable appearance of the pancreatic tail and spleen, without hypermetabolism on PET. When correlating with prior studies, this favors sequela of prior/chronic pancreatitis. However, there is progressive perisplenic fluid/stranding beneath the left hemidiaphragm (series 2/image 116). Musculoskeletal: Visualized osseous structures are within normal limits. IMPRESSION: Progressive right upper lobe nodules in this patient with known primary bronchogenic carcinoma. Minimally progressive left IMA node, suspicious for nodal metastasis. Additional ancillary findings above. Aortic Atherosclerosis (ICD10-I70.0) and Emphysema (ICD10-J43.9). Electronically Signed   By: Charline Bills M.D.   On: 08/31/2023 01:37   DG Chest 2 View  Result Date: 08/08/2023 CLINICAL DATA:  Shortness of breath, cough and right-sided chest pain. EXAM: CHEST - 2 VIEW COMPARISON:  October 31, 2022 FINDINGS: Normal cardiac silhouette. Mild tortuosity of the aorta. Upper lobe predominant emphysematous changes with prominent bulla in the right upper lobe, not significantly changed from before. Streaky bibasilar airspace opacities, left greater than right. IMPRESSION: 1. Streaky bibasilar airspace opacities, left greater than right, may represent atelectasis or pneumonia. 2. Upper lobe predominant emphysematous changes with prominent bulla in the right upper lobe, not significantly changed from before. Electronically Signed   By: Ted Mcalpine M.D.   On: 08/08/2023 13:07    Impression:  The primary encounter diagnosis was Malignant neoplasm of right upper lobe of lung (HCC). A diagnosis of Mediastinal adenopathy was also pertinent to this visit.   Enlarging hypermetabolic RUL pulmonary nodule suspicious for malignancy (negative nodal biopsies)    The patient is recovering from the  effects of radiation.  ***  Plan:  ***   *** minutes of total time was spent for this patient encounter, including preparation, face-to-face counseling with the patient and coordination of care, physical exam, and documentation of the encounter. ____________________________________  Billie Lade, PhD, MD  This document serves as a record of services personally performed by Antony Blackbird, MD. It was created on his behalf by Neena Rhymes, a trained medical scribe. The creation of this record is based on the scribe's personal observations and the provider's statements to them. This document has been checked and approved by the attending provider.

## 2023-09-07 ENCOUNTER — Encounter: Payer: Self-pay | Admitting: Radiation Oncology

## 2023-09-07 ENCOUNTER — Ambulatory Visit
Admission: RE | Admit: 2023-09-07 | Discharge: 2023-09-07 | Disposition: A | Discharge status: Home / Self Care | Payer: Medicare Other | Source: Ambulatory Visit | Attending: Radiation Oncology | Admitting: Radiation Oncology

## 2023-09-07 VITALS — BP 134/91 | HR 81 | Temp 97.9°F | Resp 20 | Ht 68.0 in | Wt 173.2 lb

## 2023-09-07 DIAGNOSIS — F17210 Nicotine dependence, cigarettes, uncomplicated: Secondary | ICD-10-CM | POA: Diagnosis not present

## 2023-09-07 DIAGNOSIS — Z7951 Long term (current) use of inhaled steroids: Secondary | ICD-10-CM | POA: Insufficient documentation

## 2023-09-07 DIAGNOSIS — I700 Atherosclerosis of aorta: Secondary | ICD-10-CM | POA: Diagnosis not present

## 2023-09-07 DIAGNOSIS — J432 Centrilobular emphysema: Secondary | ICD-10-CM | POA: Diagnosis not present

## 2023-09-07 DIAGNOSIS — J90 Pleural effusion, not elsewhere classified: Secondary | ICD-10-CM | POA: Insufficient documentation

## 2023-09-07 DIAGNOSIS — C3411 Malignant neoplasm of upper lobe, right bronchus or lung: Secondary | ICD-10-CM | POA: Diagnosis not present

## 2023-09-07 DIAGNOSIS — R590 Localized enlarged lymph nodes: Secondary | ICD-10-CM | POA: Insufficient documentation

## 2023-09-07 DIAGNOSIS — R911 Solitary pulmonary nodule: Secondary | ICD-10-CM

## 2023-09-07 DIAGNOSIS — R918 Other nonspecific abnormal finding of lung field: Secondary | ICD-10-CM | POA: Diagnosis not present

## 2023-09-07 DIAGNOSIS — I2510 Atherosclerotic heart disease of native coronary artery without angina pectoris: Secondary | ICD-10-CM | POA: Insufficient documentation

## 2023-09-07 DIAGNOSIS — Z79899 Other long term (current) drug therapy: Secondary | ICD-10-CM | POA: Diagnosis not present

## 2023-09-07 DIAGNOSIS — K769 Liver disease, unspecified: Secondary | ICD-10-CM | POA: Diagnosis not present

## 2023-09-07 NOTE — Progress Notes (Signed)
Wesley [name] is here today for follow up to receive CT results.  Lung Side: Right  Does the patient complain of any of the following: Pain:No Shortness of breath w/wo exertion: No Cough: Intermittently Hemoptysis:  No Pain with swallowing: No Swallowing/choking concerns: No Appetite: Good Energy Level: Low Post radiation skin Changes: N/A    Additional comments if applicable:

## 2023-09-10 ENCOUNTER — Encounter: Payer: Self-pay | Admitting: Gastroenterology

## 2023-09-10 ENCOUNTER — Ambulatory Visit: Payer: Medicare Other | Admitting: Gastroenterology

## 2023-09-10 ENCOUNTER — Other Ambulatory Visit: Payer: Medicare Other

## 2023-09-10 ENCOUNTER — Ambulatory Visit
Admission: RE | Admit: 2023-09-10 | Discharge: 2023-09-10 | Disposition: A | Discharge status: Home / Self Care | Payer: Medicare Other | Source: Ambulatory Visit | Attending: Gastroenterology | Admitting: Gastroenterology

## 2023-09-10 VITALS — BP 130/68 | HR 80 | Ht 68.0 in | Wt 174.0 lb

## 2023-09-10 DIAGNOSIS — R194 Change in bowel habit: Secondary | ICD-10-CM

## 2023-09-10 DIAGNOSIS — K861 Other chronic pancreatitis: Secondary | ICD-10-CM | POA: Diagnosis not present

## 2023-09-10 DIAGNOSIS — K5900 Constipation, unspecified: Secondary | ICD-10-CM

## 2023-09-10 DIAGNOSIS — R109 Unspecified abdominal pain: Secondary | ICD-10-CM | POA: Diagnosis not present

## 2023-09-10 DIAGNOSIS — R140 Abdominal distension (gaseous): Secondary | ICD-10-CM | POA: Diagnosis not present

## 2023-09-10 NOTE — Patient Instructions (Addendum)
._______________________________________________________  If your blood pressure at your visit was 140/90 or greater, please contact your primary care physician to follow up on this.  If you are age 58 or younger, your body mass index should be between 19-25. Your Body mass index is 26.46 kg/m. If this is out of the aformentioned range listed, please consider follow up with your Primary Care Provider.  ________________________________________________________  The [facility] providers would like to encourage you to use [facility] to communicate with providers for non-urgent requests or questions.  Due to long hold times on the telephone, sending your provider a message by [facility] may be a faster and more efficient way to get a response.  Please allow 48 business hours for a response.  Please remember that this is for non-urgent requests.  _______________________________________________________  Your provider has requested that you go to the basement level for lab work before leaving today. Press "B" on the elevator. The lab is located at the first door on the left as you exit the elevator.  Your provider has requested that you have an abdominal x ray before leaving today. Please go to the basement floor to our Radiology department for the test.  You have been scheduled for a CT scan of the abdomen and pelvis at [facility], 1st floor Radiology. You are scheduled on 09-17-23 at 11:30am You should arrive 15 minutes prior to your appointment time for registration and to drink contrast.   Please follow the written instructions below on the [place] of your exam:   1) Do not eat anything after 9:30am (4 hours prior to your test)   You may take any medications as prescribed with a small amount of water, if necessary. If you take any of the following medications: METFORMIN, GLUCOPHAGE, GLUCOVANCE, AVANDAMET, RIOMET, FORTAMET, ACTOPLUS MET, JANUMET, GLUMETZA or METAGLIP, you MAY be asked to HOLD this  medication 48 hours AFTER the exam.   The purpose of you drinking the oral contrast is to aid in the visualization of your intestinal tract. The contrast solution may cause some diarrhea. Depending on your individual set of symptoms, you may also receive an intravenous injection of x-ray contrast/dye. Plan on being at [facility] for 45 minutes or longer, depending on the type of exam you are having performed.   If you have any questions regarding your exam or if you need to reschedule, you may call [facility] at [phone] between the hours of 8:00 am and 5:00 pm, Monday-Friday.   It has been recommended that you complete a bowel purge (to clean out your bowels). Please do the following:  Purchase a bottle of Miralax over the counter as well as a box of 5 mg dulcolax tablets.  Take 4 dulcolax tablets.  Wait 1 hour.  You will then drink 6-8 capfuls of Miralax mixed in an adequate amount of water/juice/gatorade (you may choose which of these liquids to drink) over the next 2-3 hours.  You should expect results within 1 to 6 hours after completing the bowel purge.  Due to recent changes in healthcare laws, you may see the results of your imaging and laboratory studies on MyChart before your provider has had a chance to review them.  We understand that in some cases there may be results that are confusing or concerning to you. Not all laboratory results come back in the same time frame and the provider may be waiting for multiple results in order to interpret others.  Please give [place] 48  hours in order for your provider to thoroughly review all the results before contacting the office for clarification of your results.   Thank you for entrusting me with your care and choosing [facility].  Dr Barron Alvine
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
## 2023-09-11 LAB — CANCER ANTIGEN 19-9: CA 19-9: 33 U/mL (ref <34)

## 2023-09-17 ENCOUNTER — Ambulatory Visit (HOSPITAL_COMMUNITY)
Admission: RE | Admit: 2023-09-17 | Discharge: 2023-09-17 | Disposition: A | Discharge status: Home / Self Care | Payer: Medicare Other | Source: Ambulatory Visit | Attending: Gastroenterology | Admitting: Gastroenterology

## 2023-09-17 DIAGNOSIS — R194 Change in bowel habit: Secondary | ICD-10-CM | POA: Insufficient documentation

## 2023-09-17 DIAGNOSIS — K5730 Diverticulosis of large intestine without perforation or abscess without bleeding: Secondary | ICD-10-CM | POA: Diagnosis not present

## 2023-09-17 DIAGNOSIS — K7689 Other specified diseases of liver: Secondary | ICD-10-CM | POA: Diagnosis not present

## 2023-09-17 DIAGNOSIS — K5900 Constipation, unspecified: Secondary | ICD-10-CM | POA: Diagnosis not present

## 2023-09-17 DIAGNOSIS — K861 Other chronic pancreatitis: Secondary | ICD-10-CM | POA: Diagnosis not present

## 2023-09-17 MED ORDER — IOHEXOL 9 MG/ML PO SOLN
1000.0000 mL | Freq: Once | ORAL | Status: AC
  Administered 2023-09-17: 1000 mL via ORAL

## 2023-09-17 MED ORDER — IOHEXOL 300 MG/ML  SOLN
100.0000 mL | Freq: Once | INTRAMUSCULAR | Status: AC | PRN
  Administered 2023-09-17: 100 mL via INTRAVENOUS

## 2023-09-25 ENCOUNTER — Encounter: Payer: Self-pay | Admitting: Gastroenterology

## 2023-09-30 DIAGNOSIS — Z79899 Other long term (current) drug therapy: Secondary | ICD-10-CM | POA: Diagnosis not present

## 2023-09-30 DIAGNOSIS — Z79891 Long term (current) use of opiate analgesic: Secondary | ICD-10-CM | POA: Diagnosis not present

## 2023-09-30 DIAGNOSIS — G8929 Other chronic pain: Secondary | ICD-10-CM | POA: Diagnosis not present

## 2023-09-30 DIAGNOSIS — R1013 Epigastric pain: Secondary | ICD-10-CM | POA: Diagnosis not present

## 2023-09-30 DIAGNOSIS — F17210 Nicotine dependence, cigarettes, uncomplicated: Secondary | ICD-10-CM | POA: Diagnosis not present

## 2023-09-30 DIAGNOSIS — M545 Low back pain, unspecified: Secondary | ICD-10-CM | POA: Diagnosis not present

## 2023-10-08 ENCOUNTER — Telehealth: Payer: Self-pay | Admitting: Gastroenterology

## 2023-10-08 NOTE — Telephone Encounter (Signed)
PT is calling to give an update on medications. Please advise.

## 2023-10-08 NOTE — Telephone Encounter (Signed)
Spoke with patient regarding recent Miralax purge that he completed on 10-07-23.  Patient stated that he was able to evacuate his bowels after completing the purge.  Patient instructed to continue using Miralax 1 capful daily (titrate up or down) to achieve soft stools without straining.  Patient advised to continue adequate hydration by drinking 64 ounces of water daily.    Patient will continue with this regimen for 7 to 10 days and contact our office with an update on how is feeling.  Patient agreed to plan and verbalized understanding.  No further questions.

## 2023-10-19 ENCOUNTER — Other Ambulatory Visit: Payer: Self-pay | Admitting: Student

## 2023-10-19 ENCOUNTER — Other Ambulatory Visit: Payer: Self-pay

## 2023-10-19 DIAGNOSIS — M792 Neuralgia and neuritis, unspecified: Secondary | ICD-10-CM

## 2023-10-19 DIAGNOSIS — M48062 Spinal stenosis, lumbar region with neurogenic claudication: Secondary | ICD-10-CM

## 2023-10-21 ENCOUNTER — Other Ambulatory Visit: Payer: Self-pay | Admitting: Student

## 2023-10-21 ENCOUNTER — Other Ambulatory Visit: Payer: Self-pay

## 2023-10-21 DIAGNOSIS — M792 Neuralgia and neuritis, unspecified: Secondary | ICD-10-CM

## 2023-10-21 DIAGNOSIS — M48062 Spinal stenosis, lumbar region with neurogenic claudication: Secondary | ICD-10-CM

## 2023-10-21 MED ORDER — PREGABALIN 150 MG PO CAPS
150.0000 mg | ORAL_CAPSULE | Freq: Three times a day (TID) | ORAL | 3 refills | Status: DC
Start: 2023-10-21 — End: 2024-05-26
  Filled 2023-10-21: qty 90, 30d supply, fill #0
  Filled 2023-11-22: qty 90, 30d supply, fill #1
  Filled 2024-02-15: qty 90, 30d supply, fill #0
  Filled 2024-03-24: qty 90, 30d supply, fill #1

## 2023-10-22 ENCOUNTER — Other Ambulatory Visit: Payer: Self-pay

## 2023-10-28 DIAGNOSIS — M545 Low back pain, unspecified: Secondary | ICD-10-CM | POA: Diagnosis not present

## 2023-10-28 DIAGNOSIS — R1013 Epigastric pain: Secondary | ICD-10-CM | POA: Diagnosis not present

## 2023-10-28 DIAGNOSIS — G8929 Other chronic pain: Secondary | ICD-10-CM | POA: Diagnosis not present

## 2023-10-28 DIAGNOSIS — F17210 Nicotine dependence, cigarettes, uncomplicated: Secondary | ICD-10-CM | POA: Diagnosis not present

## 2023-10-28 DIAGNOSIS — Z79899 Other long term (current) drug therapy: Secondary | ICD-10-CM | POA: Diagnosis not present

## 2023-10-28 DIAGNOSIS — Z79891 Long term (current) use of opiate analgesic: Secondary | ICD-10-CM | POA: Diagnosis not present

## 2023-11-18 ENCOUNTER — Encounter: Payer: Self-pay | Admitting: Pharmacist

## 2023-11-18 NOTE — Progress Notes (Signed)
Pharmacy Quality Measure Review  This patient is appearing on report for being at risk of failing the measure for Statin Therapy for Patients with Cardiovascular Disease [facility]) medications this calendar year.   Patient was noted to have CVD per diagnosis of I25.10 - Atherosclerotic heart disease of native coronary artery without angina pectoris used during visit 09/03/2022.  Per Dr Lynelle Doctor notes in July 2023 - patient has PAD and aortic atherosclerosis. No need for statins d/t liver disease and LDL 43 on 03/2022  He is not currently taking a statin. Noted to have chronic liver disease / chronic Hepatitis C.   Lab Results  Component Value Date   CHOL 87 (L) 03/20/2022   HDL 28 (L) 03/20/2022   LDLCALC 41 03/20/2022   TRIG 91 03/20/2022   CHOLHDL 3.1 03/20/2022   Reviewed past history. Patient was noted to have chronic hepatitis C - with completion of treatment around 2020 or 2021.  He was noted to have elevated LFTs in May 2020 and again in April 2022.   Given that his LDL was 41 and his history of elevated LFTs, abdominal CT from 02/21/2021 noting liver cysts and hepatic steatosis and history of Hepatitis C; he could be excluded from statin therapy for CVD.   Would need exclusion code related to liver condition / disease included with future visit.  Consider addition of liver history / diagnosis to his problem list.   Henrene Pastor, PharmD Clinical Pharmacist [facility]  Population Health [phone]

## 2023-11-23 ENCOUNTER — Other Ambulatory Visit: Payer: Self-pay

## 2023-11-24 ENCOUNTER — Other Ambulatory Visit (HOSPITAL_COMMUNITY): Payer: Self-pay

## 2023-11-24 ENCOUNTER — Other Ambulatory Visit: Payer: Self-pay

## 2023-11-26 ENCOUNTER — Other Ambulatory Visit: Payer: Self-pay

## 2023-11-26 ENCOUNTER — Other Ambulatory Visit: Payer: Self-pay | Admitting: Critical Care Medicine

## 2023-11-26 DIAGNOSIS — R110 Nausea: Secondary | ICD-10-CM

## 2023-11-27 ENCOUNTER — Telehealth: Payer: Self-pay

## 2023-11-27 ENCOUNTER — Other Ambulatory Visit: Payer: Self-pay

## 2023-11-27 DIAGNOSIS — F17210 Nicotine dependence, cigarettes, uncomplicated: Secondary | ICD-10-CM | POA: Diagnosis not present

## 2023-11-27 DIAGNOSIS — Z79891 Long term (current) use of opiate analgesic: Secondary | ICD-10-CM | POA: Diagnosis not present

## 2023-11-27 DIAGNOSIS — G8929 Other chronic pain: Secondary | ICD-10-CM | POA: Diagnosis not present

## 2023-11-27 DIAGNOSIS — Z79899 Other long term (current) drug therapy: Secondary | ICD-10-CM | POA: Diagnosis not present

## 2023-11-27 DIAGNOSIS — M545 Low back pain, unspecified: Secondary | ICD-10-CM | POA: Diagnosis not present

## 2023-11-27 DIAGNOSIS — R1013 Epigastric pain: Secondary | ICD-10-CM | POA: Diagnosis not present

## 2023-11-27 MED ORDER — FLUTICASONE FUROATE-VILANTEROL 200-25 MCG/ACT IN AEPB: 1.0000 | INHALATION_SPRAY | Freq: Every day | RESPIRATORY_TRACT | 3 refills | Status: DC

## 2023-11-27 NOTE — Telephone Encounter (Signed)
OPEN ERROR

## 2023-12-01 DIAGNOSIS — Z79899 Other long term (current) drug therapy: Secondary | ICD-10-CM | POA: Diagnosis not present

## 2023-12-15 ENCOUNTER — Emergency Department (HOSPITAL_BASED_OUTPATIENT_CLINIC_OR_DEPARTMENT_OTHER)
Admission: EM | Admit: 2023-12-15 | Discharge: 2023-12-16 | Disposition: A | Discharge status: Home / Self Care | Payer: Medicare Other | Attending: Emergency Medicine | Admitting: Emergency Medicine

## 2023-12-15 ENCOUNTER — Encounter (HOSPITAL_BASED_OUTPATIENT_CLINIC_OR_DEPARTMENT_OTHER): Payer: Self-pay | Admitting: Emergency Medicine

## 2023-12-15 ENCOUNTER — Other Ambulatory Visit: Payer: Self-pay

## 2023-12-15 ENCOUNTER — Emergency Department (HOSPITAL_BASED_OUTPATIENT_CLINIC_OR_DEPARTMENT_OTHER): Payer: Medicare Other

## 2023-12-15 ENCOUNTER — Ambulatory Visit (INDEPENDENT_AMBULATORY_CARE_PROVIDER_SITE_OTHER): Payer: Medicare Other | Admitting: Internal Medicine

## 2023-12-15 VITALS — BP 112/73 | HR 103 | Temp 98.6°F | Ht 63.0 in | Wt 168.0 lb

## 2023-12-15 DIAGNOSIS — F17210 Nicotine dependence, cigarettes, uncomplicated: Secondary | ICD-10-CM | POA: Insufficient documentation

## 2023-12-15 DIAGNOSIS — K219 Gastro-esophageal reflux disease without esophagitis: Secondary | ICD-10-CM

## 2023-12-15 DIAGNOSIS — J4489 Other specified chronic obstructive pulmonary disease: Secondary | ICD-10-CM

## 2023-12-15 DIAGNOSIS — R1012 Left upper quadrant pain: Secondary | ICD-10-CM

## 2023-12-15 DIAGNOSIS — K2900 Acute gastritis without bleeding: Secondary | ICD-10-CM | POA: Diagnosis not present

## 2023-12-15 DIAGNOSIS — K5730 Diverticulosis of large intestine without perforation or abscess without bleeding: Secondary | ICD-10-CM | POA: Diagnosis not present

## 2023-12-15 DIAGNOSIS — K861 Other chronic pancreatitis: Secondary | ICD-10-CM | POA: Diagnosis not present

## 2023-12-15 DIAGNOSIS — R109 Unspecified abdominal pain: Secondary | ICD-10-CM

## 2023-12-15 DIAGNOSIS — K2970 Gastritis, unspecified, without bleeding: Secondary | ICD-10-CM

## 2023-12-15 DIAGNOSIS — M48062 Spinal stenosis, lumbar region with neurogenic claudication: Secondary | ICD-10-CM

## 2023-12-15 DIAGNOSIS — K4020 Bilateral inguinal hernia, without obstruction or gangrene, not specified as recurrent: Secondary | ICD-10-CM | POA: Diagnosis not present

## 2023-12-15 DIAGNOSIS — Z85118 Personal history of other malignant neoplasm of bronchus and lung: Secondary | ICD-10-CM | POA: Diagnosis not present

## 2023-12-15 DIAGNOSIS — Q446 Cystic disease of liver: Secondary | ICD-10-CM | POA: Diagnosis not present

## 2023-12-15 LAB — CBC
HCT: 44.7 % (ref 39.0–52.0)
Hemoglobin: 15.1 g/dL (ref 13.0–17.0)
MCH: 28.9 pg (ref 26.0–34.0)
MCHC: 33.8 g/dL (ref 30.0–36.0)
MCV: 85.6 fL (ref 80.0–100.0)
Platelets: 289 10*3/uL (ref 150–400)
RBC: 5.22 MIL/uL (ref 4.22–5.81)
RDW: 16.7 % — ABNORMAL HIGH (ref 11.5–15.5)
WBC: 13.7 10*3/uL — ABNORMAL HIGH (ref 4.0–10.5)
nRBC: 0 % (ref 0.0–0.2)

## 2023-12-15 LAB — COMPREHENSIVE METABOLIC PANEL
ALT: 11 U/L (ref 0–44)
ALT: 8 U/L (ref 0–44)
AST: 14 U/L — ABNORMAL LOW (ref 15–41)
AST: 10 U/L — ABNORMAL LOW (ref 15–41)
Albumin: 3.5 g/dL (ref 3.5–5.0)
Albumin: 4 g/dL (ref 3.5–5.0)
Alkaline Phosphatase: 86 U/L (ref 38–126)
Alkaline Phosphatase: 81 U/L (ref 38–126)
Anion gap: 12 (ref 5–15)
Anion gap: 10 (ref 5–15)
BUN: 8 mg/dL (ref 6–20)
BUN: 8 mg/dL (ref 6–20)
CO2: 25 mmol/L (ref 22–32)
CO2: 25 mmol/L (ref 22–32)
Calcium: 8.9 mg/dL (ref 8.9–10.3)
Calcium: 9.1 mg/dL (ref 8.9–10.3)
Chloride: 100 mmol/L (ref 98–111)
Chloride: 100 mmol/L (ref 98–111)
Creatinine, Ser: 1.07 mg/dL (ref 0.61–1.24)
Creatinine, Ser: 0.98 mg/dL (ref 0.61–1.24)
GFR, Estimated: >60 mL/min (ref >60)
GFR, Estimated: >60 mL/min (ref >60)
Glucose, Bld: 80 mg/dL (ref 70–99)
Glucose, Bld: 99 mg/dL (ref 70–99)
Potassium: 3.3 mmol/L — ABNORMAL LOW (ref 3.5–5.1)
Potassium: 3.2 mmol/L — ABNORMAL LOW (ref 3.5–5.1)
Sodium: 137 mmol/L (ref 135–145)
Sodium: 135 mmol/L (ref 135–145)
Total Bilirubin: 1.2 mg/dL (ref 0.0–1.2)
Total Bilirubin: 1.2 mg/dL (ref 0.0–1.2)
Total Protein: 7.2 g/dL (ref 6.5–8.1)
Total Protein: 7.9 g/dL (ref 6.5–8.1)

## 2023-12-15 LAB — URINALYSIS, ROUTINE W REFLEX MICROSCOPIC
Bacteria, UA: NONE SEEN
Bilirubin Urine: NEGATIVE
Glucose, UA: NEGATIVE mg/dL
Ketones, ur: NEGATIVE mg/dL
Leukocytes,Ua: NEGATIVE
Nitrite: NEGATIVE
Protein, ur: NEGATIVE mg/dL
Specific Gravity, Urine: 1.011 (ref 1.005–1.030)
pH: 6.5 (ref 5.0–8.0)

## 2023-12-15 LAB — LIPASE, BLOOD
Lipase: 54 U/L — ABNORMAL HIGH (ref 11–51)
Lipase: 55 U/L — ABNORMAL HIGH (ref 11–51)

## 2023-12-15 LAB — AMYLASE: Amylase: 97 U/L (ref 28–100)

## 2023-12-15 MED ORDER — IOHEXOL 300 MG/ML  SOLN
100.0000 mL | Freq: Once | INTRAMUSCULAR | Status: AC | PRN
  Administered 2023-12-15: 85 mL via INTRAVENOUS
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
## 2023-12-15 NOTE — Patient Instructions (Addendum)
 Thank you, [name] for allowing us  to provide your care today.   Abdominal pain I am getting labs that will result quickly. My biggest concern if you have pancreatitis or inflammation of your pancreas. I would like you to go to the emergency room if this pain does not improve by tomorrow AM. The labs this evening should help sort through what this could be as well.  I have ordered the following labs for you:  Lab Orders         Amylase         CMP w [facility] (STAT/Sunquest-performed on-site)         Lipase       I have ordered the following medication/changed the following medications:   Stop the following medications: Medications Discontinued During This Encounter  Medication Reason   amoxicillin -clavulanate (AUGMENTIN ) 875-125 MG tablet    doxycycline  (VIBRAMYCIN ) 100 MG capsule    predniSONE  (DELTASONE ) 10 MG tablet    albuterol  (VENTOLIN  HFA) 108 (90 Base) MCG/ACT inhaler      Start the following medications: No orders of the defined types were placed in this encounter.    Follow up:  1 month    We look forward to seeing you next time. Please call our clinic at [phone] if you have any questions or concerns. The best time to call is Monday-Friday from 9am-4pm, but there is someone available 24/7. If after hours or the weekend, call the main hospital number and ask for the Internal Medicine Resident On-Call. If you need medication refills, please notify your pharmacy one week in advance and they will send us  a request.   Thank you for trusting me with your care. Wishing you the best!   Izetta Medley, DO [facility]

## 2023-12-15 NOTE — Assessment & Plan Note (Signed)
 Patient with history of recurrent pancreatitis. He is not taking creon regularly due to cost.  -message sent to pharmacy tech to see if more affordable way to get medication.

## 2023-12-16 ENCOUNTER — Other Ambulatory Visit (HOSPITAL_COMMUNITY): Payer: Self-pay

## 2023-12-16 MED ORDER — HYDROMORPHONE HCL 1 MG/ML IJ SOLN
1.0000 mg | Freq: Once | INTRAMUSCULAR | Status: AC
  Administered 2023-12-16: 1 mg via INTRAVENOUS
  Filled 2023-12-16: qty 1

## 2023-12-16 MED ORDER — FLUTICASONE FUROATE-VILANTEROL 200-25 MCG/ACT IN AEPB: 1.0000 | INHALATION_SPRAY | Freq: Every day | RESPIRATORY_TRACT | 3 refills | Status: DC

## 2023-12-16 MED ORDER — ONDANSETRON HCL 4 MG/2ML IJ SOLN
4.0000 mg | Freq: Once | INTRAMUSCULAR | Status: AC
  Administered 2023-12-16: 4 mg via INTRAVENOUS
  Filled 2023-12-16: qty 2

## 2023-12-16 MED ORDER — SODIUM CHLORIDE 0.9 % IV BOLUS
1000.0000 mL | Freq: Once | INTRAVENOUS | Status: AC
  Administered 2023-12-16: 1000 mL via INTRAVENOUS

## 2023-12-16 MED ORDER — ALUM & MAG HYDROXIDE-SIMETH 200-200-20 MG/5ML PO SUSP
30.0000 mL | Freq: Once | ORAL | Status: AC
  Administered 2023-12-16: 30 mL via ORAL
  Filled 2023-12-16: qty 30

## 2023-12-16 MED ORDER — AMITRIPTYLINE HCL 50 MG PO TABS: 50.0000 mg | ORAL_TABLET | Freq: Every day | ORAL | 3 refills | Status: DC

## 2023-12-16 MED ORDER — PANTOPRAZOLE SODIUM 40 MG IV SOLR
40.0000 mg | Freq: Once | INTRAVENOUS | Status: AC
  Administered 2023-12-16: 40 mg via INTRAVENOUS
  Filled 2023-12-16: qty 10

## 2023-12-16 MED ORDER — FAMOTIDINE 20 MG PO TABS: 20.0000 mg | ORAL_TABLET | Freq: Two times a day (BID) | ORAL | 2 refills | Status: DC

## 2023-12-16 MED ORDER — SUCRALFATE 1 G PO TABS: 1.0000 g | ORAL_TABLET | Freq: Four times a day (QID) | ORAL | 0 refills | Status: DC | PRN

## 2023-12-16 NOTE — Discharge Instructions (Signed)
 You were evaluated in the Emergency Department and after careful evaluation, we did not find any emergent condition requiring admission or further testing in the hospital.  Your exam/testing today is overall reassuring.  Symptoms likely due to inflammation of the stomach.  Recommend filling the prescription for Protonix  which is taken daily to prevent pain.  Can use the Carafate  medication as needed for more immediate relief.  Follow-up with your GI doctors to discuss your symptoms and your visit to the emergency department.  Please return to the Emergency Department if you experience any worsening of your condition.   Thank you for allowing us  to be a part of your care.

## 2023-12-16 NOTE — Addendum Note (Signed)
 Addended by: Bishop Bullock on: 12/16/2023 07:37 AM   Modules accepted: Orders
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
## 2023-12-17 ENCOUNTER — Telehealth: Payer: Self-pay

## 2023-12-17 ENCOUNTER — Other Ambulatory Visit: Payer: Self-pay | Admitting: Internal Medicine

## 2023-12-17 DIAGNOSIS — K2970 Gastritis, unspecified, without bleeding: Secondary | ICD-10-CM

## 2023-12-17 NOTE — Transitions of Care (Post Inpatient/ED Visit) (Signed)
12/17/2023  Name: Wesley [name] MRN: [id number] DOB: 10/16/1966  Today's TOC FU Call Status: Today's TOC FU Call Status:: Successful TOC FU Call Completed TOC FU Call Complete Date: 12/17/23 Patient's Name and Date of Birth confirmed.  Transition Care Management Follow-up Telephone Call Date of Discharge: 12/16/23 Discharge Facility: [facility] Type of Discharge: Emergency Department Reason for ED Visit: Other: (gastritis) How have you been since you were released from the hospital?: Better Any questions or concerns?: No  Items Reviewed: Did you receive and understand the discharge instructions provided?: Yes Medications obtained,verified, and reconciled?: Yes (Medications Reviewed) Any new allergies since your discharge?: No Dietary orders reviewed?: Yes Do you have support at home?: Yes People in Home: significant other  Medications Reviewed Today: Medications Reviewed Today     Reviewed by Karena Addison, LPN (Licensed Practical Nurse) on 12/17/23 at 1040  Med List Status: <None>   Medication Order Taking? Sig Documenting Provider Last Dose Status Informant  acetaminophen (TYLENOL) 500 MG tablet [id number] No Take 2 tablets (1,000 mg total) by mouth every 8 (eight) hours as needed for moderate pain. Rocky Morel, DO Taking Active Self  albuterol (PROVENTIL) (2.5 MG/3ML) 0.083% nebulizer solution [id number] No Take 3 mLs (2.5 mg total) by nebulization every 6 (six) hours as needed for wheezing or shortness of breath.  Patient not taking: Reported on 09/07/2023   Parrett, Virgel Bouquet, NP Not Taking Active   albuterol (VENTOLIN HFA) 108 (90 Base) MCG/ACT inhaler [id number] No Inhale 2 puffs into the lungs every 6 (six) hours as needed for wheezing or shortness of breath. Bevelyn Ngo, NP Taking Active   amitriptyline ([name]) 50 MG tablet [id number]  Take 1 tablet (50 mg total) by mouth at bedtime. Masters, Florentina Addison, DO  Active   baclofen (LIORESAL) 10 MG tablet  [id number] No Take 0.5-1 tablets (5-10 mg total) by mouth every 8 (eight) hours as needed.  Patient taking differently: Take 5-10 mg by mouth every 8 (eight) hours as needed for muscle spasms.   Storm Frisk, MD Taking Active Self  DULoxetine (CYMBALTA) 60 MG capsule [id number] No TAKE 1 CAPSULE BY MOUTH DAILY Storm Frisk, MD Taking Expired 10/24/23 2359   famotidine (PEPCID) 20 MG tablet [id number]  Take 1 tablet (20 mg total) by mouth 2 (two) times daily. Masters, Florentina Addison, DO  Active   fluticasone furoate-vilanterol (BREO ELLIPTA) 200-25 MCG/ACT AEPB [id number]  Inhale 1 puff into the lungs daily. (Must have office visit for refills) Rocky Morel, DO  Active   fluticasone furoate-vilanterol (BREO ELLIPTA) 200-25 MCG/ACT AEPB [id number]  Inhale 1 puff into the lungs daily. (Must have office visit for refills) Masters, Florentina Addison, DO  Active   lidocaine (LIDODERM) 5 % [id number] No Place 1 patch onto the skin 2 (two) times a week. Remove & Discard patch within 12 hours or as directed by MD Leslye Peer, MD Taking Active   lipase/protease/amylase ([name]) 36000 UNITS CPEP capsule [id number] No Take 2 capsules (72,000 Units total) by mouth 3 (three) times daily with meals AND 1 capsule (36,000 Units total) with snacks. Max: 9 capsules per Vaccaro. Storm Frisk, MD Taking Active Self  Multiple Vitamin (MULTIVITAMIN WITH MINERALS) TABS tablet [id number] No Take 1 tablet by mouth daily. [provider] Taking Active Self  ondansetron (ZOFRAN) 4 MG tablet [id number] No Take 1 tablet (4 mg total) by mouth every 8 (eight) hours as needed for nausea or vomiting. Rocky Morel, DO Taking Active   oxycodone (ROXICODONE) 30 MG  immediate release tablet [id number] No Take 15 mg by mouth 2 (two) times daily. [provider] Taking Active Self  pantoprazole (PROTONIX) 40 MG tablet [id number] No TAKE 1 TABLET BY MOUTH TWICE  DAILY BEFORE A MEAL Storm Frisk, MD Taking Active   polyethylene glycol  (MIRALAX / GLYCOLAX) 17 g packet [id number] No Take 17 g by mouth daily as needed (constipation). [provider] Taking Active Self  pregabalin (LYRICA) 150 MG capsule [id number]  Take 1 capsule (150 mg total) by mouth in the morning, at noon, and at bedtime. Rocky Morel, DO  Active   senna (SENOKOT) 8.6 MG TABS tablet [id number] No Take 1 tablet (8.6 mg total) by mouth daily. Steffanie Rainwater, MD Taking Active Self  sucralfate (CARAFATE) 1 g tablet [id number]  Take 1 tablet (1 g total) by mouth 4 (four) times daily as needed. Sabas Sous, MD  Active   tamsulosin [facility]) 0.4 MG CAPS capsule [id number] No TAKE 1 CAPSULE BY MOUTH DAILY Storm Frisk, MD Taking Active   umeclidinium bromide (INCRUSE ELLIPTA) 62.5 MCG/ACT AEPB [id number] No Inhale 1 puff into the lungs daily. Parrett, Virgel Bouquet, NP Taking Active   vitamin B-12 (CYANOCOBALAMIN) 1000 MCG tablet [id number] No Take 1 tablet (1,000 mcg total) by mouth daily. Storm Frisk, MD Taking Active Self  Vitamin D, Ergocalciferol, (DRISDOL) 1.25 MG (50000 UNIT) CAPS capsule [id number] No Take 1 capsule (50,000 Units total) by mouth every 7 (seven) days.  Patient taking differently: Take 50,000 Units by mouth every Tuesday.   Storm Frisk, MD Taking Active Self            Home Care and Equipment/Supplies: Were Home Health Services Ordered?: NA Any new equipment or medical supplies ordered?: NA  Functional Questionnaire: Do you need assistance with bathing/showering or dressing?: No Do you need assistance with meal preparation?: No Do you need assistance with eating?: No Do you have difficulty maintaining continence: No Do you need assistance with getting out of bed/getting out of a chair/moving?: No Do you have difficulty managing or taking your medications?: No  Follow up appointments reviewed: PCP Follow-up appointment confirmed?: NA Specialist Hospital Follow-up appointment confirmed?: No Reason Specialist  Follow-Up Not Confirmed: Patient has Specialist Provider Number and will Call for Appointment Do you need transportation to your follow-up appointment?: No Do you understand care options if your condition(s) worsen?: Yes-patient verbalized understanding    SIGNATURE Karena Addison, LPN [facility] Nurse Health Advisor Direct Dial [phone]

## 2023-12-17 NOTE — Progress Notes (Signed)
 Internal Medicine Clinic Attending  Case discussed with the resident at the time of the visit.  We reviewed the resident's history and exam and pertinent patient test results.  I agree with the assessment, diagnosis, and plan of care documented in the resident's note.
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
## 2023-12-21 ENCOUNTER — Ambulatory Visit (HOSPITAL_COMMUNITY)
Admission: RE | Admit: 2023-12-21 | Discharge: 2023-12-21 | Disposition: A | Discharge status: Home / Self Care | Payer: Medicare Other | Source: Ambulatory Visit | Attending: Internal Medicine | Admitting: Internal Medicine

## 2023-12-21 ENCOUNTER — Encounter: Payer: Self-pay | Admitting: Internal Medicine

## 2023-12-21 DIAGNOSIS — Z9049 Acquired absence of other specified parts of digestive tract: Secondary | ICD-10-CM | POA: Diagnosis not present

## 2023-12-21 DIAGNOSIS — K2970 Gastritis, unspecified, without bleeding: Secondary | ICD-10-CM | POA: Insufficient documentation

## 2023-12-21 DIAGNOSIS — I700 Atherosclerosis of aorta: Secondary | ICD-10-CM | POA: Diagnosis not present

## 2023-12-21 DIAGNOSIS — R918 Other nonspecific abnormal finding of lung field: Secondary | ICD-10-CM | POA: Diagnosis not present

## 2023-12-21 DIAGNOSIS — R109 Unspecified abdominal pain: Secondary | ICD-10-CM | POA: Diagnosis not present

## 2023-12-21 MED ORDER — GADOBUTROL 1 MMOL/ML IV SOLN
7.0000 mL | Freq: Once | INTRAVENOUS | Status: AC | PRN
  Administered 2023-12-21: 7 mL via INTRAVENOUS

## 2023-12-23 ENCOUNTER — Encounter: Payer: Self-pay | Admitting: Gastroenterology

## 2023-12-23 ENCOUNTER — Ambulatory Visit: Payer: Medicare Other | Admitting: Gastroenterology

## 2023-12-23 VITALS — BP 120/80 | HR 80 | Ht 68.0 in | Wt 167.2 lb

## 2023-12-23 DIAGNOSIS — K860 Alcohol-induced chronic pancreatitis: Secondary | ICD-10-CM

## 2023-12-23 DIAGNOSIS — K862 Cyst of pancreas: Secondary | ICD-10-CM | POA: Diagnosis not present

## 2023-12-23 DIAGNOSIS — R933 Abnormal findings on diagnostic imaging of other parts of digestive tract: Secondary | ICD-10-CM | POA: Diagnosis not present

## 2023-12-23 DIAGNOSIS — R1012 Left upper quadrant pain: Secondary | ICD-10-CM

## 2023-12-23 DIAGNOSIS — R112 Nausea with vomiting, unspecified: Secondary | ICD-10-CM

## 2023-12-23 DIAGNOSIS — R110 Nausea: Secondary | ICD-10-CM

## 2023-12-23 MED ORDER — PANCRELIPASE (LIP-PROT-AMYL) 36000-114000 UNITS PO CPEP: ORAL_CAPSULE | ORAL | 3 refills | Status: AC

## 2023-12-23 MED ORDER — PANTOPRAZOLE SODIUM 40 MG PO TBEC: 40.0000 mg | DELAYED_RELEASE_TABLET | Freq: Two times a day (BID) | ORAL | 3 refills | Status: DC

## 2023-12-23 MED ORDER — AMITRIPTYLINE HCL 50 MG PO TABS: 50.0000 mg | ORAL_TABLET | Freq: Every day | ORAL | 3 refills | Status: DC

## 2023-12-23 NOTE — Patient Instructions (Addendum)
_______________________________________________________  If your blood pressure at your visit was 140/90 or greater, please contact your primary care physician to follow up on this.  _______________________________________________________  If you are age 59 or older, your body mass index should be between 23-30. Your Body mass index is 25.43 kg/m. If this is out of the aforementioned range listed, please consider follow up with your Primary Care Provider.  If you are age 59 or younger, your body mass index should be between 19-25. Your Body mass index is 25.43 kg/m. If this is out of the aformentioned range listed, please consider follow up with your Primary Care Provider.   ________________________________________________________  The [place] [facility] providers would like to encourage you to use [facility] to communicate with providers for non-urgent requests or questions.  Due to long hold times on the telephone, sending your provider a message by [facility] may be a faster and more efficient way to get a response.  Please allow 48 business hours for a response.  Please remember that this is for non-urgent requests.  _______________________________________________________  We have sent the following medications to your pharmacy for you to pick up at your convenience:  [name] 50mg  at bedtime INCREASE: Protonix to 40mg  one tablet two times daily [name]: 72,000 units with each mean and 26,000 units with snacks  You have been scheduled for an endoscopy. Please follow written instructions given to you at your visit today.  If you use inhalers (even only as needed), please bring them with you on the [place] of your procedure.  If you take any of the following medications, they will need to be adjusted prior to your procedure:   DO NOT TAKE 7 DAYS PRIOR TO TEST- Trulicity (dulaglutide) Ozempic, Wegovy (semaglutide) Mounjaro (tirzepatide) Bydureon Bcise (exanatide extended release)  DO NOT TAKE 1 [place]  PRIOR TO YOUR TEST Rybelsus (semaglutide) Adlyxin (lixisenatide) Victoza (liraglutide) Byetta (exanatide) ___________________________________________________________________________   Due to recent changes in healthcare laws, you may see the results of your imaging and laboratory studies on MyChart before your provider has had a chance to review them.  We understand that in some cases there may be results that are confusing or concerning to you. Not all laboratory results come back in the same time frame and the provider may be waiting for multiple results in order to interpret others.  Please give [place] 48 hours in order for your provider to thoroughly review all the results before contacting the office for clarification of your results.   It was a pleasure to see you today!  Vito Cirigliano, D.O.
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
## 2023-12-25 DIAGNOSIS — R1013 Epigastric pain: Secondary | ICD-10-CM | POA: Diagnosis not present

## 2023-12-25 DIAGNOSIS — M545 Low back pain, unspecified: Secondary | ICD-10-CM | POA: Diagnosis not present

## 2023-12-25 DIAGNOSIS — Z79899 Other long term (current) drug therapy: Secondary | ICD-10-CM | POA: Diagnosis not present

## 2023-12-25 DIAGNOSIS — Z79891 Long term (current) use of opiate analgesic: Secondary | ICD-10-CM | POA: Diagnosis not present

## 2023-12-25 DIAGNOSIS — G8929 Other chronic pain: Secondary | ICD-10-CM | POA: Diagnosis not present

## 2023-12-29 ENCOUNTER — Ambulatory Visit: Payer: Medicare Other | Admitting: Gastroenterology

## 2023-12-29 ENCOUNTER — Encounter: Payer: Self-pay | Admitting: Gastroenterology

## 2023-12-29 VITALS — BP 130/87 | HR 76 | Temp 97.7°F | Resp 19 | Ht 68.0 in | Wt 167.0 lb

## 2023-12-29 DIAGNOSIS — J449 Chronic obstructive pulmonary disease, unspecified: Secondary | ICD-10-CM | POA: Diagnosis not present

## 2023-12-29 DIAGNOSIS — K2970 Gastritis, unspecified, without bleeding: Secondary | ICD-10-CM

## 2023-12-29 DIAGNOSIS — R933 Abnormal findings on diagnostic imaging of other parts of digestive tract: Secondary | ICD-10-CM

## 2023-12-29 DIAGNOSIS — R1012 Left upper quadrant pain: Secondary | ICD-10-CM

## 2023-12-29 DIAGNOSIS — R110 Nausea: Secondary | ICD-10-CM

## 2023-12-29 DIAGNOSIS — K3189 Other diseases of stomach and duodenum: Secondary | ICD-10-CM | POA: Diagnosis not present

## 2023-12-29 DIAGNOSIS — K319 Disease of stomach and duodenum, unspecified: Secondary | ICD-10-CM | POA: Diagnosis not present

## 2023-12-29 MED ORDER — SODIUM CHLORIDE 0.9 % IV SOLN: 500.0000 mL | Freq: Once | INTRAVENOUS | Status: DC
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
## 2023-12-29 NOTE — Progress Notes (Signed)
Pt's states no medical or surgical changes since previsit or office visit.

## 2023-12-29 NOTE — Progress Notes (Signed)
Sedate, gd SR, tolerated procedure well, VSS, report to RN

## 2023-12-29 NOTE — Op Note (Signed)
[facility] Patient Name: Wesley [name] Procedure Date: 12/29/2023 10:16 AM MRN: [id number] Endoscopist: Doristine Locks , MD, [id number] Age: 58 Referring MD:  Date of Birth: Feb 08, 1966 Gender: Male Account #: [id number] Procedure:                Upper GI endoscopy Indications:              Abdominal pain in the left upper quadrant, Abnormal                            CT of the GI tract, Abnormal MRI of the GI tract Medicines:                Monitored Anesthesia Care Procedure:                Pre-Anesthesia Assessment:                           - Prior to the procedure, a History and Physical                            was performed, and patient medications and                            allergies were reviewed. The patient's tolerance of                            previous anesthesia was also reviewed. The risks                            and benefits of the procedure and the sedation                            options and risks were discussed with the patient.                            All questions were answered, and informed consent                            was obtained. Prior Anticoagulants: The patient has                            taken no anticoagulant or antiplatelet agents. ASA                            Grade Assessment: II - A patient with mild systemic                            disease. After reviewing the risks and benefits,                            the patient was deemed in satisfactory condition to                            undergo the procedure.  After obtaining informed consent, the endoscope was                            passed under direct vision. Throughout the                            procedure, the patient's blood pressure, pulse, and                            oxygen saturations were monitored continuously. The                            GIF [id number] #[id number] was introduced through the                            mouth,  and advanced to the third part of duodenum.                            The upper GI endoscopy was accomplished without                            difficulty. The patient tolerated the procedure                            well. Scope In: Scope Out: Findings:                 The examined esophagus was normal.                           The Z-line was regular and was found 44 cm from the                            incisors.                           Mild inflammation characterized by congestion                            (edema) and erythema was found in the gastric                            fundus, in the gastric body and in the gastric                            antrum. Biopsies were taken with a cold forceps for                            histology and Helicobacter pylori testing.                            Estimated blood loss was minimal.                           The examined duodenum was normal. Complications:  No immediate complications. Estimated Blood Loss:     Estimated blood loss was minimal. Impression:               - Normal esophagus.                           - Z-line regular, 44 cm from the incisors.                           - Mild, non-ulcer gastritis. Biopsied.                           - Normal examined duodenum. Recommendation:           - Patient has a contact number available for                            emergencies. The signs and symptoms of potential                            delayed complications were discussed with the                            patient. Return to normal activities tomorrow.                            Written discharge instructions were provided to the                            patient.                           - Resume previous diet.                           - Continue present medications.                           - Await pathology results. Doristine Locks, MD 12/29/2023 10:39:01 AM

## 2023-12-29 NOTE — Progress Notes (Signed)
Called to room to assist during endoscopic procedure.  Patient ID and intended procedure confirmed with present staff. Received instructions for my participation in the procedure from the performing physician.

## 2023-12-29 NOTE — Patient Instructions (Signed)
Resume previous diet and medications. Awaiting pathology results.   YOU HAD AN ENDOSCOPIC PROCEDURE TODAY AT THE [facility]:   Refer to the procedure report that was given to you for any specific questions about what was found during the examination.  If the procedure report does not answer your questions, please call your gastroenterologist to clarify.  If you requested that your care partner not be given the details of your procedure findings, then the procedure report has been included in a sealed envelope for you to review at your convenience later.  YOU SHOULD EXPECT: Some feelings of bloating in the abdomen. Passage of more gas than usual.  Walking can help get rid of the air that was put into your GI tract during the procedure and reduce the bloating. If you had a lower endoscopy (such as a colonoscopy or flexible sigmoidoscopy) you may notice spotting of blood in your stool or on the toilet paper. If you underwent a bowel prep for your procedure, you may not have a normal bowel movement for a few days.  Please Note:  You might notice some irritation and congestion in your nose or some drainage.  This is from the oxygen used during your procedure.  There is no need for concern and it should clear up in a [place] or so.  SYMPTOMS TO REPORT IMMEDIATELY:  Following upper endoscopy (EGD)  Vomiting of blood or coffee ground material  New chest pain or pain under the shoulder blades  Painful or persistently difficult swallowing  New shortness of breath  Fever of 100F or higher  Black, tarry-looking stools  For urgent or emergent issues, a gastroenterologist can be reached at any hour by calling [phone]. Do not use MyChart messaging for urgent concerns.    DIET:  We do recommend a small meal at first, but then you may proceed to your regular diet.  Drink plenty of fluids but you should avoid alcoholic beverages for 24 hours.  ACTIVITY:  You should plan to take it easy for  the rest of today and you should NOT DRIVE or use heavy machinery until tomorrow (because of the sedation medicines used during the test).    FOLLOW UP: Our staff will call the number listed on your records the next business Demo following your procedure.  We will call around 7:15- 8:00 am to check on you and address any questions or concerns that you may have regarding the information given to you following your procedure. If we do not reach you, we will leave a message.     If any biopsies were taken you will be contacted by phone or by letter within the next 1-3 weeks.  Please call us at [phone] if you have not heard about the biopsies in 3 weeks.    SIGNATURES/CONFIDENTIALITY: You and/or your care partner have signed paperwork which will be entered into your electronic medical record.  These signatures attest to the fact that that the information above on your After Visit Summary has been reviewed and is understood.  Full responsibility of the confidentiality of this discharge information lies with you and/or your care-partner.

## 2023-12-30 ENCOUNTER — Telehealth: Payer: Self-pay

## 2023-12-30 DIAGNOSIS — Z79899 Other long term (current) drug therapy: Secondary | ICD-10-CM | POA: Diagnosis not present

## 2023-12-30 NOTE — Telephone Encounter (Signed)
  Follow up Call-     12/29/2023    9:37 AM 05/22/2022    8:42 AM 12/24/2021    7:10 AM  Call back number  Post procedure Call Back phone  # [phone] #[phone] hm [phone]  Permission to leave phone message Yes Yes Yes     Patient questions:  Do you have a fever, pain , or abdominal swelling? No. Pain Score  0 *  Have you tolerated food without any problems? Yes.    Have you been able to return to your normal activities? Yes.    Do you have any questions about your discharge instructions: Diet   No. Medications  No. Follow up visit  No.  Do you have questions or concerns about your Care? No.  Actions: * If pain score is 4 or above: No action needed, pain <4.

## 2023-12-31 LAB — SURGICAL PATHOLOGY

## 2024-01-07 ENCOUNTER — Encounter: Payer: Self-pay | Admitting: Gastroenterology

## 2024-01-25 ENCOUNTER — Other Ambulatory Visit (HOSPITAL_COMMUNITY): Payer: Self-pay

## 2024-01-26 DIAGNOSIS — Z79899 Other long term (current) drug therapy: Secondary | ICD-10-CM | POA: Diagnosis not present

## 2024-01-26 DIAGNOSIS — G8929 Other chronic pain: Secondary | ICD-10-CM | POA: Diagnosis not present

## 2024-01-26 DIAGNOSIS — M545 Low back pain, unspecified: Secondary | ICD-10-CM | POA: Diagnosis not present

## 2024-01-26 DIAGNOSIS — Z79891 Long term (current) use of opiate analgesic: Secondary | ICD-10-CM | POA: Diagnosis not present

## 2024-01-26 DIAGNOSIS — R1013 Epigastric pain: Secondary | ICD-10-CM | POA: Diagnosis not present

## 2024-02-04 ENCOUNTER — Other Ambulatory Visit (HOSPITAL_COMMUNITY): Payer: Self-pay

## 2024-02-04 ENCOUNTER — Telehealth: Payer: Self-pay

## 2024-02-04 NOTE — Telephone Encounter (Signed)
-----   Message from [place] sent at 01/29/2024  9:30 AM EST ----- Regarding: RE: creon That'd be great! Thank you for your help. ----- Message ----- From: Otho Najjar, CPhT Sent: 01/25/2024   3:22 PM EST To: Rudene Christians, DO Subject: RE: creon                                      I am so sorry! I am checking my messages and realized I may not have replied to this one. [employer] patient assistance company is available for this medication (since he has part D coverage). I can mail the application to him if he is still taking this?! ----- Message ----- From: Rudene Christians, DO Sent: 12/15/2023   7:41 PM EST To: Otho Najjar, CPhT Subject: creon                                          Good evening!  I was wondering if you had any resources for this patient as he is having trouble affording [name].  Thank you, [employer]

## 2024-02-09 NOTE — Progress Notes (Signed)
 Pharmacy Medication Assistance Program Note    02/29/2024  Patient ID: Wesley [name], male   DOB: 1966/04/21, 58 y.o.   MRN: [id number]     02/04/2024  Outreach Medication One  Manufacturer Medication One [employer] [name]  Dose of [name] 36,000  Type of Assistance Manufacturer Assistance  Date Application Sent to Patient 02/09/2024  Application Items Requested Application;Proof of Income  Date Application Sent to Prescriber 02/17/2024  Name of Prescriber Lovie Macadamia  Date Application Received From Patient 02/16/2024  Date Application Received From Provider 02/24/2024  Date Application Submitted to Manufacturer 02/24/2024  Method Application Sent to Manufacturer Fax  Patient Assistance Determination --     Decision - application placed on hold. Patient eligible to appy for LIS/Extra Help with Social Security.

## 2024-02-15 ENCOUNTER — Other Ambulatory Visit (HOSPITAL_COMMUNITY): Payer: Self-pay

## 2024-02-22 ENCOUNTER — Other Ambulatory Visit: Payer: Self-pay | Admitting: Critical Care Medicine

## 2024-02-22 DIAGNOSIS — R110 Nausea: Secondary | ICD-10-CM

## 2024-02-23 DIAGNOSIS — M545 Low back pain, unspecified: Secondary | ICD-10-CM | POA: Diagnosis not present

## 2024-02-23 DIAGNOSIS — Z79899 Other long term (current) drug therapy: Secondary | ICD-10-CM | POA: Diagnosis not present

## 2024-02-23 DIAGNOSIS — G8929 Other chronic pain: Secondary | ICD-10-CM | POA: Diagnosis not present

## 2024-02-23 DIAGNOSIS — Z79891 Long term (current) use of opiate analgesic: Secondary | ICD-10-CM | POA: Diagnosis not present

## 2024-02-23 DIAGNOSIS — R1013 Epigastric pain: Secondary | ICD-10-CM | POA: Diagnosis not present

## 2024-02-25 ENCOUNTER — Telehealth: Payer: Self-pay | Admitting: *Deleted

## 2024-02-25 NOTE — Telephone Encounter (Signed)
 Called patient to inform of CT for 03-07-24- arrival time- 3 pm @ [facility], no restrictions to scan, patient to receive results  from Dr. Roselind Messier on April 10 @ 10:30 am, spoke with patient and he is aware of these appts. and the instructions

## 2024-02-26 NOTE — Telephone Encounter (Signed)
 Duloxetine has not been filled (by available records) since 03/2023 and there is no recent documentation of his adherence to this medication. I would recommend he call if this medication is something he has been consistently taking.   Tamsulosin was discussed at my last visit with the patient so I will refill this although the fill history to now is similar to the duloxetine. Risks, benefits, and alternatives were discussed at that visit.

## 2024-02-29 NOTE — Telephone Encounter (Signed)
 Left message requesting call back to explain PAP denial.

## 2024-03-05 ENCOUNTER — Other Ambulatory Visit: Payer: Self-pay | Admitting: Internal Medicine

## 2024-03-05 DIAGNOSIS — J4489 Other specified chronic obstructive pulmonary disease: Secondary | ICD-10-CM

## 2024-03-07 ENCOUNTER — Ambulatory Visit (HOSPITAL_COMMUNITY)
Admission: RE | Admit: 2024-03-07 | Discharge: 2024-03-07 | Disposition: A | Discharge status: Home / Self Care | Source: Ambulatory Visit | Attending: Radiation Oncology | Admitting: Radiation Oncology

## 2024-03-07 DIAGNOSIS — I700 Atherosclerosis of aorta: Secondary | ICD-10-CM | POA: Diagnosis not present

## 2024-03-07 DIAGNOSIS — R911 Solitary pulmonary nodule: Secondary | ICD-10-CM | POA: Insufficient documentation

## 2024-03-07 DIAGNOSIS — J432 Centrilobular emphysema: Secondary | ICD-10-CM | POA: Diagnosis not present

## 2024-03-07 DIAGNOSIS — C3490 Malignant neoplasm of unspecified part of unspecified bronchus or lung: Secondary | ICD-10-CM | POA: Diagnosis not present

## 2024-03-07 MED ORDER — SODIUM CHLORIDE (PF) 0.9 % IJ SOLN
INTRAMUSCULAR | Status: AC
  Filled 2024-03-07: qty 50

## 2024-03-07 MED ORDER — IOHEXOL 300 MG/ML  SOLN
75.0000 mL | Freq: Once | INTRAMUSCULAR | Status: AC | PRN
  Administered 2024-03-07: 75 mL via INTRAVENOUS

## 2024-03-09 NOTE — Progress Notes (Signed)
 Radiation Oncology         [phone] ________________________________  Name: Wesley [name] MRN: [id number]  Date: 03/10/2024  DOB: [birth year]  Follow-Up Visit Note  CC: Wesley Morel, DO  Leslye Peer, MD  R91.1   ICD-10-CM   1. Incidental lung nodule, greater than or equal to 8mm  R91.1 CT Chest W Contrast    2. Primary malignant neoplasm of bronchus of right upper lobe (HCC)  C34.11 CANCELED: CT Chest W Contrast    3. Abnormal CT of the chest  R93.89 CT Chest W Contrast      Diagnosis: Enlarging hypermetabolic RUL pulmonary nodule suspicious for malignancy (negative nodal biopsies)  Narrative:  The patient returns today for routine follow-up and to review most recent imaging. He was last seen in office on 09/07/23 for a routine follow up. Patient continued to follow up with their specialists to manage their chronic conditions.                               Patient presented to the ED on 12/16/23 with complains of LUQ abdominal pain with associated nausea and vomiting. CT abdomen done at that time showed sequelae associated with moderate severity gastritis involving the lateral aspect of the gastric fundus. He later presented for an abdomen MRI on 1/20 confirming thickening of the gastric fundal and body mucosa, generally consistent with gastritis or chronic sequelae thereof and fluid signal cystic lesion of the tip of the pancreatic tail and splenic hilum measuring 1.7 x 1.5 cm, consistent with a pancreatic pseudocyst. In light of findings, he underwent a EDG with Dr. Barron Alvine. Pathology indicated gastric antral and oxyntic mucosa with reactive gastropathy which is negative for helicobacter pylori.      Most recent CT chest preformed on 03/07/24 showed previously noted right upper lobe pulmonary nodule has resolved and no longer discretely measurable. Additional linear left lower lobe nodule is no longer seen and no new or enlarging nodules were noted. Scan also demonstrated  decreased size of multi station mediastinal lymph nodes. Unchanged prominent upper abdominal lymph nodes.  No other significant oncologic interval history since the patient was last seen.   Patient reports to be doing very well overall today.  He denies any worsening shortness of breath, cough, chest pain, or any other changes to his breathing since we last saw him.  Allergies:  has no known allergies.  Meds: Current Outpatient Medications  Medication Sig Dispense Refill   acetaminophen (TYLENOL) 500 MG tablet Take 2 tablets (1,000 mg total) by mouth every 8 (eight) hours as needed for moderate pain. 120 tablet 0   albuterol (PROVENTIL) (2.5 MG/3ML) 0.083% nebulizer solution Take 3 mLs (2.5 mg total) by nebulization every 6 (six) hours as needed for wheezing or shortness of breath. 75 mL 5   albuterol (VENTOLIN HFA) 108 (90 Base) MCG/ACT inhaler Inhale 2 puffs into the lungs every 6 (six) hours as needed for wheezing or shortness of breath. 33.5 g 6   amitriptyline ([name]) 50 MG tablet Take 1 tablet (50 mg total) by mouth at bedtime. 90 tablet 3   baclofen (LIORESAL) 10 MG tablet Take 0.5-1 tablets (5-10 mg total) by mouth every 8 (eight) hours as needed. (Patient taking differently: Take 5-10 mg by mouth every 8 (eight) hours as needed for muscle spasms.) 60 each 4   bisacodyl (DULCOLAX) 5 MG EC tablet Take 5 mg by mouth daily as needed for  moderate constipation.     BREO ELLIPTA 200-25 MCG/ACT AEPB USE 1 INHALATION BY MOUTH DAILY 180 each 3   famotidine (PEPCID) 20 MG tablet Take 1 tablet (20 mg total) by mouth 2 (two) times daily. 200 tablet 2   ibuprofen (ADVIL) 800 MG tablet Take 800 mg by mouth every 6 (six) hours as needed.     lipase/protease/amylase ([name]) 36000 UNITS CPEP capsule Take 2 capsules (72,000 Units total) by mouth 3 (three) times daily with meals AND 1 capsule (36,000 Units total) with snacks. Max: 9 capsules per [place]. 810 capsule 3   Multiple Vitamin (MULTIVITAMIN WITH  MINERALS) TABS tablet Take 1 tablet by mouth daily.     ondansetron (ZOFRAN) 4 MG tablet Take 1 tablet (4 mg total) by mouth every 8 (eight) hours as needed for nausea or vomiting. 60 tablet 1   oxycodone (ROXICODONE) 30 MG immediate release tablet Take 15 mg by mouth 2 (two) times daily.     pantoprazole (PROTONIX) 40 MG tablet TAKE 1 TABLET BY MOUTH TWICE  DAILY BEFORE MEALS 200 tablet 2   polyethylene glycol (MIRALAX / GLYCOLAX) 17 g packet Take 17 g by mouth daily as needed (constipation).     pregabalin (LYRICA) 150 MG capsule Take 1 capsule (150 mg total) by mouth in the morning, at noon, and at bedtime. 90 capsule 3   tamsulosin (FLOMAX) 0.4 MG CAPS capsule TAKE 1 CAPSULE BY MOUTH DAILY 100 capsule 2   umeclidinium bromide (INCRUSE ELLIPTA) 62.5 MCG/ACT AEPB Inhale 1 puff into the lungs daily. 30 each 2   vitamin B-12 (CYANOCOBALAMIN) 1000 MCG tablet Take 1 tablet (1,000 mcg total) by mouth daily. 60 tablet 4   Vitamin D, Ergocalciferol, (DRISDOL) 1.25 MG (50000 UNIT) CAPS capsule Take 1 capsule (50,000 Units total) by mouth every 7 (seven) days. (Patient taking differently: Take 50,000 Units by mouth every Tuesday.) 14 capsule 3   DULoxetine (CYMBALTA) 60 MG capsule TAKE 1 CAPSULE BY MOUTH DAILY 90 capsule 1   lidocaine (LIDODERM) 5 % Place 1 patch onto the skin 2 (two) times a week. Remove & Discard patch within 12 hours or as directed by MD (Patient not taking: Reported on 03/10/2024)     sucralfate (CARAFATE) 1 g tablet Take 1 tablet (1 g total) by mouth 4 (four) times daily as needed. (Patient not taking: Reported on 03/10/2024) 30 tablet 0   No current facility-administered medications for this encounter.    Physical Findings: The patient is in no acute distress. Patient is alert and oriented.  height is 5\' 8"  (1.727 m) and weight is 176 lb 2 oz (79.9 kg). His temporal temperature is 97.1 F (36.2 C) (abnormal). His blood pressure is 136/84 and his pulse is 96. His respiration is 18  and oxygen saturation is 94%. .  No significant changes. Lungs are clear to auscultation bilaterally. Heart has regular rate and rhythm. No palpable cervical, supraclavicular, or axillary adenopathy. Abdomen soft, non-tender, normal bowel sounds.   Lab Findings: Lab Results  Component Value Date   WBC 13.7 (H) 12/15/2023   HGB 15.1 12/15/2023   HCT 44.7 12/15/2023   MCV 85.6 12/15/2023   PLT 289 12/15/2023    Radiographic Findings: CT Chest W Contrast Result Date: 03/10/2024 CLINICAL DATA:  Non-small cell lung cancer.  * Tracking Code: BO * EXAM: CT CHEST WITH CONTRAST TECHNIQUE: Multidetector CT imaging of the chest was performed during intravenous contrast administration. RADIATION DOSE REDUCTION: This exam was performed according to the departmental  dose-optimization program which includes automated exposure control, adjustment of the mA and/or kV according to patient size and/or use of iterative reconstruction technique. CONTRAST:  75mL OMNIPAQUE IOHEXOL 300 MG/ML  SOLN COMPARISON:  CT chest dated 09/04/2023 FINDINGS: Cardiovascular: Normal heart size. No significant pericardial fluid/thickening. Great vessels are normal in course and caliber. No central pulmonary emboli. Coronary artery calcifications and aortic atherosclerosis. Mediastinum/Nodes: Imaged thyroid gland without nodules meeting criteria for imaging follow-up by size. Normal esophagus. Decreased size of multi station lymph nodes, for example 9 mm left internal mammary lymph node (2:57), previously 10 mm, 7 mm right lower paratracheal lymph node (2:71), previously 11 mm, and 6 mm left cardiophrenic (2:136), previously 10 mm. Lungs/Pleura: The central airways are patent. Severe apical predominant paraseptal emphysema with scattered mild to moderate centrilobular emphysema. Unchanged right middle lobe linear atelectasis/scarring. Previously noted right upper lobe pulmonary nodule has resolved and no longer discretely measurable.  Additional linear left lower lobe nodule is no longer seen. No new or enlarging nodules. No pneumothorax. No pleural effusion. Upper abdomen: Unchanged scattered hepatic hypodensities, likely cysts. Cholecystectomy. Unchanged multifocal splenic hypodensities. Similar appearance of pancreatic tail, inseparable from the hepatic hilum. Ill-defined hypodensity within the pancreatic tail, better evaluated on prior MRI. Multiple upper abdominal collaterals. Unchanged prominent perigastric lymph nodes. Musculoskeletal:  Healing left anterolateral ninth rib fracture. IMPRESSION: 1. Previously noted right upper lobe pulmonary nodule has resolved and no longer discretely measurable. Additional linear left lower lobe nodule is no longer seen. No new or enlarging nodules. 2. Decreased size of multi station mediastinal lymph nodes. Unchanged prominent upper abdominal lymph nodes. 3. Healing left anterolateral ninth rib fracture. 4. Ill-defined hypodensity within the pancreatic tail, better evaluated on prior MRI. 5. Aortic Atherosclerosis (ICD10-I70.0) and Emphysema (ICD10-J43.9). Coronary artery calcifications. Assessment for potential risk factor modification, dietary therapy or pharmacologic therapy may be warranted, if clinically indicated. Electronically Signed   By: Agustin Cree M.D.   On: 03/10/2024 08:48    Impression/Plan: Enlarging hypermetabolic RUL pulmonary nodule suspicious for malignancy (negative nodal biopsies)  We personally reviewed the patient's most recent imaging.  CT of the chest on 03/07/2024 demonstrated the previously noted right upper lobe and left lower lobe nodules to have resolved and a decrease in size of the mediastinal lymph nodes.  No other evidence of progressive disease was seen.    Dr. Roselind Messier recommends follow-up with a CT of the chest in 6 months.  We will schedule the patient to be seen afterwards to review the results of the scan.  He is comfortable with the stated plan and was  encouraged to call in the meantime with any questions or concerns.    20 minutes of total time was spent for this patient encounter, including preparation, face-to-face counseling with the patient and coordination of care, physical exam, and documentation of the encounter. ____________________________________   [name], PA-C   This document serves as a record of services personally performed by Antony Blackbird, MD. It was created on his behalf by Herbie Saxon, a trained medical scribe. The creation of this record is based on the scribe's personal observations and the provider's statements to them. This document has been checked and approved by the attending provider.

## 2024-03-10 ENCOUNTER — Ambulatory Visit
Admission: RE | Admit: 2024-03-10 | Discharge: 2024-03-10 | Disposition: A | Discharge status: Home / Self Care | Payer: Self-pay | Source: Ambulatory Visit | Attending: Radiation Oncology | Admitting: Radiation Oncology

## 2024-03-10 ENCOUNTER — Encounter: Payer: Self-pay | Admitting: Radiation Oncology

## 2024-03-10 VITALS — BP 136/84 | HR 96 | Temp 97.1°F | Resp 18 | Ht 68.0 in | Wt 176.1 lb

## 2024-03-10 DIAGNOSIS — C3411 Malignant neoplasm of upper lobe, right bronchus or lung: Secondary | ICD-10-CM

## 2024-03-10 DIAGNOSIS — F17210 Nicotine dependence, cigarettes, uncomplicated: Secondary | ICD-10-CM | POA: Diagnosis not present

## 2024-03-10 DIAGNOSIS — R9389 Abnormal findings on diagnostic imaging of other specified body structures: Secondary | ICD-10-CM

## 2024-03-10 DIAGNOSIS — R911 Solitary pulmonary nodule: Secondary | ICD-10-CM

## 2024-03-10 NOTE — Progress Notes (Signed)
 Wesley [name] is here today for follow up To receive CT scan results.     Does the patient complain of any of the following: Pain:Denies Shortness of breath w/wo exertion: Denies Cough: Denies Hemoptysis: Denies Pain with swallowing: Denies Swallowing/choking concerns: Denies Appetite: Good Energy Level: Good   BP 136/84 (BP Location: Left Arm, Patient Position: Sitting)   Pulse 96   Temp (!) 97.1 F (36.2 C) (Temporal)   Resp 18   Ht 5\' 8"  (1.727 m)   Wt 176 lb 2 oz (79.9 kg)   SpO2 94%   BMI 26.78 kg/m

## 2024-03-24 ENCOUNTER — Other Ambulatory Visit: Payer: Self-pay

## 2024-03-24 ENCOUNTER — Other Ambulatory Visit (HOSPITAL_COMMUNITY): Payer: Self-pay

## 2024-03-25 ENCOUNTER — Other Ambulatory Visit (HOSPITAL_COMMUNITY): Payer: Self-pay

## 2024-03-25 DIAGNOSIS — G8929 Other chronic pain: Secondary | ICD-10-CM | POA: Diagnosis not present

## 2024-03-25 DIAGNOSIS — R1013 Epigastric pain: Secondary | ICD-10-CM | POA: Diagnosis not present

## 2024-03-25 DIAGNOSIS — Z79891 Long term (current) use of opiate analgesic: Secondary | ICD-10-CM | POA: Diagnosis not present

## 2024-03-25 DIAGNOSIS — Z79899 Other long term (current) drug therapy: Secondary | ICD-10-CM | POA: Diagnosis not present

## 2024-03-25 DIAGNOSIS — M545 Low back pain, unspecified: Secondary | ICD-10-CM | POA: Diagnosis not present

## 2024-03-30 DIAGNOSIS — Z79899 Other long term (current) drug therapy: Secondary | ICD-10-CM | POA: Diagnosis not present

## 2024-04-10 ENCOUNTER — Other Ambulatory Visit: Payer: Self-pay

## 2024-04-10 ENCOUNTER — Encounter (HOSPITAL_COMMUNITY): Payer: Self-pay

## 2024-04-10 ENCOUNTER — Inpatient Hospital Stay (HOSPITAL_COMMUNITY)
Admission: EM | Admit: 2024-04-10 | Discharge: 2024-05-05 | DRG: 438 | Disposition: A | Discharge status: Home / Self Care | Attending: Family Medicine | Admitting: Family Medicine

## 2024-04-10 ENCOUNTER — Emergency Department (HOSPITAL_COMMUNITY)

## 2024-04-10 DIAGNOSIS — K5669 Other partial intestinal obstruction: Secondary | ICD-10-CM

## 2024-04-10 DIAGNOSIS — R1012 Left upper quadrant pain: Secondary | ICD-10-CM | POA: Diagnosis not present

## 2024-04-10 DIAGNOSIS — E876 Hypokalemia: Secondary | ICD-10-CM | POA: Diagnosis not present

## 2024-04-10 DIAGNOSIS — K861 Other chronic pancreatitis: Secondary | ICD-10-CM

## 2024-04-10 DIAGNOSIS — Z8719 Personal history of other diseases of the digestive system: Secondary | ICD-10-CM

## 2024-04-10 DIAGNOSIS — K859 Acute pancreatitis without necrosis or infection, unspecified: Principal | ICD-10-CM | POA: Diagnosis present

## 2024-04-10 DIAGNOSIS — D75839 Thrombocytosis, unspecified: Secondary | ICD-10-CM | POA: Diagnosis not present

## 2024-04-10 DIAGNOSIS — E663 Overweight: Secondary | ICD-10-CM | POA: Diagnosis present

## 2024-04-10 DIAGNOSIS — N400 Enlarged prostate without lower urinary tract symptoms: Secondary | ICD-10-CM | POA: Diagnosis present

## 2024-04-10 DIAGNOSIS — K219 Gastro-esophageal reflux disease without esophagitis: Secondary | ICD-10-CM | POA: Diagnosis not present

## 2024-04-10 DIAGNOSIS — T40605A Adverse effect of unspecified narcotics, initial encounter: Secondary | ICD-10-CM | POA: Diagnosis not present

## 2024-04-10 DIAGNOSIS — J439 Emphysema, unspecified: Secondary | ICD-10-CM | POA: Diagnosis not present

## 2024-04-10 DIAGNOSIS — G8929 Other chronic pain: Secondary | ICD-10-CM | POA: Diagnosis not present

## 2024-04-10 DIAGNOSIS — R140 Abdominal distension (gaseous): Secondary | ICD-10-CM

## 2024-04-10 DIAGNOSIS — K5903 Drug induced constipation: Secondary | ICD-10-CM | POA: Diagnosis not present

## 2024-04-10 DIAGNOSIS — R1032 Left lower quadrant pain: Secondary | ICD-10-CM

## 2024-04-10 DIAGNOSIS — J984 Other disorders of lung: Secondary | ICD-10-CM | POA: Diagnosis not present

## 2024-04-10 DIAGNOSIS — J441 Chronic obstructive pulmonary disease with (acute) exacerbation: Secondary | ICD-10-CM | POA: Diagnosis not present

## 2024-04-10 DIAGNOSIS — U071 COVID-19: Secondary | ICD-10-CM | POA: Diagnosis not present

## 2024-04-10 DIAGNOSIS — D649 Anemia, unspecified: Secondary | ICD-10-CM | POA: Diagnosis not present

## 2024-04-10 DIAGNOSIS — J45909 Unspecified asthma, uncomplicated: Secondary | ICD-10-CM | POA: Diagnosis not present

## 2024-04-10 DIAGNOSIS — K8689 Other specified diseases of pancreas: Secondary | ICD-10-CM | POA: Diagnosis not present

## 2024-04-10 DIAGNOSIS — F17210 Nicotine dependence, cigarettes, uncomplicated: Secondary | ICD-10-CM | POA: Diagnosis present

## 2024-04-10 DIAGNOSIS — Z6823 Body mass index (BMI) 23.0-23.9, adult: Secondary | ICD-10-CM

## 2024-04-10 DIAGNOSIS — R188 Other ascites: Secondary | ICD-10-CM | POA: Diagnosis not present

## 2024-04-10 DIAGNOSIS — E871 Hypo-osmolality and hyponatremia: Secondary | ICD-10-CM | POA: Diagnosis not present

## 2024-04-10 DIAGNOSIS — K860 Alcohol-induced chronic pancreatitis: Secondary | ICD-10-CM | POA: Diagnosis present

## 2024-04-10 DIAGNOSIS — K863 Pseudocyst of pancreas: Secondary | ICD-10-CM | POA: Diagnosis not present

## 2024-04-10 DIAGNOSIS — C3411 Malignant neoplasm of upper lobe, right bronchus or lung: Secondary | ICD-10-CM | POA: Diagnosis not present

## 2024-04-10 DIAGNOSIS — J440 Chronic obstructive pulmonary disease with acute lower respiratory infection: Secondary | ICD-10-CM | POA: Diagnosis present

## 2024-04-10 DIAGNOSIS — K5289 Other specified noninfective gastroenteritis and colitis: Secondary | ICD-10-CM | POA: Diagnosis present

## 2024-04-10 DIAGNOSIS — I728 Aneurysm of other specified arteries: Secondary | ICD-10-CM | POA: Diagnosis not present

## 2024-04-10 DIAGNOSIS — J9811 Atelectasis: Secondary | ICD-10-CM | POA: Diagnosis not present

## 2024-04-10 DIAGNOSIS — R1084 Generalized abdominal pain: Secondary | ICD-10-CM | POA: Diagnosis not present

## 2024-04-10 DIAGNOSIS — I517 Cardiomegaly: Secondary | ICD-10-CM | POA: Diagnosis not present

## 2024-04-10 DIAGNOSIS — R933 Abnormal findings on diagnostic imaging of other parts of digestive tract: Secondary | ICD-10-CM | POA: Diagnosis not present

## 2024-04-10 DIAGNOSIS — Z79899 Other long term (current) drug therapy: Secondary | ICD-10-CM

## 2024-04-10 DIAGNOSIS — E440 Moderate protein-calorie malnutrition: Secondary | ICD-10-CM | POA: Diagnosis not present

## 2024-04-10 DIAGNOSIS — G629 Polyneuropathy, unspecified: Secondary | ICD-10-CM | POA: Diagnosis not present

## 2024-04-10 DIAGNOSIS — K2970 Gastritis, unspecified, without bleeding: Secondary | ICD-10-CM | POA: Diagnosis present

## 2024-04-10 DIAGNOSIS — R0600 Dyspnea, unspecified: Secondary | ICD-10-CM | POA: Diagnosis not present

## 2024-04-10 DIAGNOSIS — J189 Pneumonia, unspecified organism: Secondary | ICD-10-CM | POA: Diagnosis not present

## 2024-04-10 DIAGNOSIS — R109 Unspecified abdominal pain: Secondary | ICD-10-CM | POA: Diagnosis not present

## 2024-04-10 DIAGNOSIS — K567 Ileus, unspecified: Secondary | ICD-10-CM | POA: Diagnosis not present

## 2024-04-10 DIAGNOSIS — H538 Other visual disturbances: Secondary | ICD-10-CM | POA: Diagnosis present

## 2024-04-10 LAB — BASIC METABOLIC PANEL WITH GFR
Anion gap: 15 (ref 5–15)
BUN: 10 mg/dL (ref 6–20)
CO2: 22 mmol/L (ref 22–32)
Calcium: 9 mg/dL (ref 8.9–10.3)
Chloride: 98 mmol/L (ref 98–111)
Creatinine, Ser: 0.97 mg/dL (ref 0.61–1.24)
GFR, Estimated: >60 mL/min (ref >60)
Glucose, Bld: 139 mg/dL — ABNORMAL HIGH (ref 70–99)
Potassium: 3.5 mmol/L (ref 3.5–5.1)
Sodium: 135 mmol/L (ref 135–145)

## 2024-04-10 LAB — LACTIC ACID, PLASMA
Lactic Acid, Venous: 0.8 mmol/L (ref 0.5–1.9)
Lactic Acid, Venous: 0.8 mmol/L (ref 0.5–1.9)

## 2024-04-10 LAB — BLOOD GAS, VENOUS
Acid-Base Excess: 4.7 mmol/L — ABNORMAL HIGH (ref 0.0–2.0)
Bicarbonate: 28.2 mmol/L — ABNORMAL HIGH (ref 20.0–28.0)
O2 Saturation: 92.2 %
Patient temperature: 37
pCO2, Ven: 37 mmHg — ABNORMAL LOW (ref 44–60)
pH, Ven: 7.49 — ABNORMAL HIGH (ref 7.25–7.43)
pO2, Ven: 60 mmHg — ABNORMAL HIGH (ref 32–45)

## 2024-04-10 LAB — RESP PANEL BY RT-PCR (RSV, FLU A&B, COVID)  RVPGX2
Influenza A by PCR: NEGATIVE
Influenza B by PCR: NEGATIVE
Resp Syncytial Virus by PCR: NEGATIVE
SARS Coronavirus 2 by RT PCR: POSITIVE — AB

## 2024-04-10 LAB — CBC
HCT: 43.8 % (ref 39.0–52.0)
Hemoglobin: 14.5 g/dL (ref 13.0–17.0)
MCH: 29.4 pg (ref 26.0–34.0)
MCHC: 33.1 g/dL (ref 30.0–36.0)
MCV: 88.7 fL (ref 80.0–100.0)
Platelets: 273 10*3/uL (ref 150–400)
RBC: 4.94 MIL/uL (ref 4.22–5.81)
RDW: 16.9 % — ABNORMAL HIGH (ref 11.5–15.5)
WBC: 20.9 10*3/uL — ABNORMAL HIGH (ref 4.0–10.5)
nRBC: 0 % (ref 0.0–0.2)

## 2024-04-10 LAB — TROPONIN I (HIGH SENSITIVITY)
Troponin I (High Sensitivity): 7 ng/L (ref <18)
Troponin I (High Sensitivity): 6 ng/L (ref <18)

## 2024-04-10 LAB — HEPATIC FUNCTION PANEL
ALT: 18 U/L (ref 0–44)
AST: 21 U/L (ref 15–41)
Albumin: 3.4 g/dL — ABNORMAL LOW (ref 3.5–5.0)
Alkaline Phosphatase: 140 U/L — ABNORMAL HIGH (ref 38–126)
Bilirubin, Direct: 0.8 mg/dL — ABNORMAL HIGH (ref 0.0–0.2)
Indirect Bilirubin: 2.3 mg/dL — ABNORMAL HIGH (ref 0.3–0.9)
Total Bilirubin: 3.1 mg/dL — ABNORMAL HIGH (ref 0.0–1.2)
Total Protein: 8 g/dL (ref 6.5–8.1)

## 2024-04-10 LAB — HIV ANTIBODY (ROUTINE TESTING W REFLEX): HIV Screen 4th Generation wRfx: NONREACTIVE

## 2024-04-10 LAB — BRAIN NATRIURETIC PEPTIDE: B Natriuretic Peptide: 55.1 pg/mL (ref 0.0–100.0)

## 2024-04-10 LAB — LIPASE, BLOOD: Lipase: 121 U/L — ABNORMAL HIGH (ref 11–51)

## 2024-04-10 LAB — MAGNESIUM: Magnesium: 2.2 mg/dL (ref 1.7–2.4)

## 2024-04-10 MED ORDER — VANCOMYCIN HCL 1750 MG/350ML IV SOLN
1750.0000 mg | Freq: Once | INTRAVENOUS | Status: AC
  Administered 2024-04-10: 1750 mg via INTRAVENOUS
  Filled 2024-04-10: qty 350

## 2024-04-10 MED ORDER — IOHEXOL 350 MG/ML SOLN
75.0000 mL | Freq: Once | INTRAVENOUS | Status: AC | PRN
  Administered 2024-04-10: 75 mL via INTRAVENOUS

## 2024-04-10 MED ORDER — SODIUM CHLORIDE 0.9 % IV SOLN
2.0000 g | Freq: Three times a day (TID) | INTRAVENOUS | Status: DC
  Administered 2024-04-10 – 2024-04-11 (×4): 2 g via INTRAVENOUS
  Filled 2024-04-10 (×4): qty 12.5

## 2024-04-10 MED ORDER — PANTOPRAZOLE SODIUM 40 MG PO TBEC
40.0000 mg | DELAYED_RELEASE_TABLET | Freq: Two times a day (BID) | ORAL | Status: DC
Start: 2024-04-10 — End: 2024-04-19
  Administered 2024-04-10 – 2024-04-19 (×14): 40 mg via ORAL
  Filled 2024-04-10 (×15): qty 1

## 2024-04-10 MED ORDER — SODIUM CHLORIDE 0.9 % IV SOLN
2.0000 g | Freq: Once | INTRAVENOUS | Status: AC
  Administered 2024-04-10 (×2): 2 g via INTRAVENOUS
  Filled 2024-04-10: qty 12.5

## 2024-04-10 MED ORDER — METHYLPREDNISOLONE SODIUM SUCC 40 MG IJ SOLR: 40.0000 mg | Freq: Every day | INTRAMUSCULAR | Status: DC

## 2024-04-10 MED ORDER — METRONIDAZOLE 500 MG/100ML IV SOLN
500.0000 mg | Freq: Once | INTRAVENOUS | Status: AC
  Administered 2024-04-10: 500 mg via INTRAVENOUS
  Filled 2024-04-10: qty 100

## 2024-04-10 MED ORDER — TAMSULOSIN HCL 0.4 MG PO CAPS: 0.4000 mg | ORAL_CAPSULE | Freq: Every day | ORAL | Status: DC

## 2024-04-10 MED ORDER — VANCOMYCIN HCL IN DEXTROSE 1-5 GM/200ML-% IV SOLN
1000.0000 mg | Freq: Two times a day (BID) | INTRAVENOUS | Status: DC
  Administered 2024-04-10 – 2024-04-11 (×3): 1000 mg via INTRAVENOUS
  Filled 2024-04-10 (×3): qty 200

## 2024-04-10 MED ORDER — OXYCODONE HCL 5 MG PO TABS
5.0000 mg | ORAL_TABLET | ORAL | Status: DC | PRN
  Administered 2024-04-10 – 2024-04-18 (×9): 5 mg via ORAL
  Filled 2024-04-10 (×9): qty 1

## 2024-04-10 MED ORDER — IPRATROPIUM-ALBUTEROL 0.5-2.5 (3) MG/3ML IN SOLN
3.0000 mL | Freq: Once | RESPIRATORY_TRACT | Status: AC
  Administered 2024-04-10: 3 mL via RESPIRATORY_TRACT
  Filled 2024-04-10: qty 3

## 2024-04-10 MED ORDER — PREDNISONE 20 MG PO TABS
40.0000 mg | ORAL_TABLET | Freq: Every day | ORAL | Status: DC
  Administered 2024-04-11 – 2024-04-19 (×9): 40 mg via ORAL
  Filled 2024-04-10 (×9): qty 2

## 2024-04-10 MED ORDER — FAMOTIDINE 20 MG PO TABS: 20.0000 mg | ORAL_TABLET | Freq: Two times a day (BID) | ORAL | Status: DC

## 2024-04-10 MED ORDER — METRONIDAZOLE 500 MG/100ML IV SOLN
500.0000 mg | Freq: Two times a day (BID) | INTRAVENOUS | Status: DC
  Administered 2024-04-10 – 2024-04-19 (×18): 500 mg via INTRAVENOUS
  Filled 2024-04-10 (×18): qty 100

## 2024-04-10 MED ORDER — AMITRIPTYLINE HCL 50 MG PO TABS
50.0000 mg | ORAL_TABLET | Freq: Every day | ORAL | Status: DC
  Administered 2024-04-10 – 2024-05-04 (×24): 50 mg via ORAL
  Filled 2024-04-10 (×15): qty 2
  Filled 2024-04-10: qty 1
  Filled 2024-04-10 (×4): qty 2
  Filled 2024-04-10: qty 1
  Filled 2024-04-10 (×5): qty 2

## 2024-04-10 MED ORDER — VITAMIN B-12 1000 MCG PO TABS
1000.0000 ug | ORAL_TABLET | Freq: Every day | ORAL | Status: DC
  Administered 2024-04-10 – 2024-05-05 (×25): 1000 ug via ORAL
  Filled 2024-04-10 (×25): qty 1

## 2024-04-10 MED ORDER — ACETAMINOPHEN 325 MG PO TABS
650.0000 mg | ORAL_TABLET | Freq: Four times a day (QID) | ORAL | Status: DC | PRN
  Administered 2024-04-10 – 2024-05-05 (×9): 650 mg via ORAL
  Filled 2024-04-10 (×9): qty 2

## 2024-04-10 MED ORDER — PANCRELIPASE (LIP-PROT-AMYL) 36000-114000 UNITS PO CPEP
36000.0000 [IU] | ORAL_CAPSULE | ORAL | Status: DC
  Administered 2024-04-10 – 2024-04-25 (×9): 36000 [IU] via ORAL
  Filled 2024-04-10 (×7): qty 1

## 2024-04-10 MED ORDER — HYDROMORPHONE HCL 1 MG/ML IJ SOLN
0.5000 mg | INTRAMUSCULAR | Status: DC | PRN
  Administered 2024-04-10: 0.5 mg via INTRAVENOUS
  Administered 2024-04-10 – 2024-04-15 (×21): 1 mg via INTRAVENOUS
  Filled 2024-04-10 (×23): qty 1

## 2024-04-10 MED ORDER — ALBUTEROL SULFATE (2.5 MG/3ML) 0.083% IN NEBU
2.5000 mg | INHALATION_SOLUTION | RESPIRATORY_TRACT | Status: DC | PRN
  Administered 2024-04-10: 2.5 mg via RESPIRATORY_TRACT
  Filled 2024-04-10: qty 3

## 2024-04-10 MED ORDER — POLYETHYLENE GLYCOL 3350 17 G PO PACK
17.0000 g | PACK | Freq: Every day | ORAL | Status: DC | PRN
  Administered 2024-04-12: 17 g via ORAL
  Filled 2024-04-10 (×2): qty 1

## 2024-04-10 MED ORDER — FLUTICASONE FUROATE-VILANTEROL 200-25 MCG/ACT IN AEPB
1.0000 | INHALATION_SPRAY | Freq: Every day | RESPIRATORY_TRACT | Status: DC
  Administered 2024-04-10 – 2024-05-05 (×25): 1 via RESPIRATORY_TRACT
  Filled 2024-04-10 (×3): qty 28

## 2024-04-10 MED ORDER — ACETAMINOPHEN 650 MG RE SUPP: 650.0000 mg | Freq: Four times a day (QID) | RECTAL | Status: DC | PRN

## 2024-04-10 MED ORDER — PREGABALIN 75 MG PO CAPS
150.0000 mg | ORAL_CAPSULE | Freq: Three times a day (TID) | ORAL | Status: DC
  Administered 2024-04-10 – 2024-05-05 (×74): 150 mg via ORAL
  Filled 2024-04-10 (×2): qty 2
  Filled 2024-04-10: qty 3
  Filled 2024-04-10 (×4): qty 2
  Filled 2024-04-10: qty 3
  Filled 2024-04-10 (×2): qty 2
  Filled 2024-04-10 (×2): qty 3
  Filled 2024-04-10 (×3): qty 2
  Filled 2024-04-10 (×4): qty 3
  Filled 2024-04-10 (×8): qty 2
  Filled 2024-04-10: qty 3
  Filled 2024-04-10: qty 2
  Filled 2024-04-10: qty 3
  Filled 2024-04-10: qty 2
  Filled 2024-04-10: qty 3
  Filled 2024-04-10: qty 2
  Filled 2024-04-10: qty 3
  Filled 2024-04-10: qty 2
  Filled 2024-04-10 (×2): qty 3
  Filled 2024-04-10: qty 2
  Filled 2024-04-10: qty 3
  Filled 2024-04-10 (×2): qty 2
  Filled 2024-04-10: qty 3
  Filled 2024-04-10: qty 2
  Filled 2024-04-10: qty 3
  Filled 2024-04-10 (×2): qty 2
  Filled 2024-04-10: qty 6
  Filled 2024-04-10 (×5): qty 2
  Filled 2024-04-10: qty 3
  Filled 2024-04-10: qty 2
  Filled 2024-04-10: qty 3
  Filled 2024-04-10 (×6): qty 2
  Filled 2024-04-10 (×2): qty 3
  Filled 2024-04-10 (×3): qty 2
  Filled 2024-04-10: qty 3
  Filled 2024-04-10: qty 2
  Filled 2024-04-10: qty 3
  Filled 2024-04-10 (×3): qty 2
  Filled 2024-04-10: qty 3
  Filled 2024-04-10: qty 2
  Filled 2024-04-10: qty 3

## 2024-04-10 MED ORDER — ONDANSETRON HCL 4 MG/2ML IJ SOLN
4.0000 mg | Freq: Four times a day (QID) | INTRAMUSCULAR | Status: DC | PRN
  Administered 2024-04-15 – 2024-05-04 (×22): 4 mg via INTRAVENOUS
  Filled 2024-04-10 (×23): qty 2

## 2024-04-10 MED ORDER — PANCRELIPASE (LIP-PROT-AMYL) 36000-114000 UNITS PO CPEP
72000.0000 [IU] | ORAL_CAPSULE | Freq: Three times a day (TID) | ORAL | Status: DC
  Administered 2024-04-11 – 2024-04-26 (×18): 72000 [IU] via ORAL
  Filled 2024-04-10 (×27): qty 2

## 2024-04-10 MED ORDER — UMECLIDINIUM BROMIDE 62.5 MCG/ACT IN AEPB
1.0000 | INHALATION_SPRAY | Freq: Every day | RESPIRATORY_TRACT | Status: DC
  Administered 2024-04-10 – 2024-05-05 (×24): 1 via RESPIRATORY_TRACT
  Filled 2024-04-10 (×4): qty 7

## 2024-04-10 MED ORDER — ENOXAPARIN SODIUM 40 MG/0.4ML IJ SOSY
40.0000 mg | PREFILLED_SYRINGE | INTRAMUSCULAR | Status: DC
  Administered 2024-04-10 – 2024-04-20 (×10): 40 mg via SUBCUTANEOUS
  Filled 2024-04-10 (×11): qty 0.4

## 2024-04-10 MED ORDER — ADULT MULTIVITAMIN W/MINERALS CH
1.0000 | ORAL_TABLET | Freq: Every day | ORAL | Status: DC
  Administered 2024-04-10 – 2024-05-05 (×25): 1 via ORAL
  Filled 2024-04-10 (×25): qty 1

## 2024-04-10 MED ORDER — FENTANYL CITRATE PF 50 MCG/ML IJ SOSY
100.0000 ug | PREFILLED_SYRINGE | Freq: Once | INTRAMUSCULAR | Status: AC
  Administered 2024-04-10: 100 ug via INTRAVENOUS
  Filled 2024-04-10: qty 2

## 2024-04-10 MED ORDER — ONDANSETRON HCL 4 MG PO TABS
4.0000 mg | ORAL_TABLET | Freq: Four times a day (QID) | ORAL | Status: DC | PRN
  Administered 2024-04-13 – 2024-04-15 (×3): 4 mg via ORAL
  Filled 2024-04-10 (×2): qty 1

## 2024-04-10 MED ORDER — BACLOFEN 10 MG PO TABS
5.0000 mg | ORAL_TABLET | Freq: Three times a day (TID) | ORAL | Status: DC | PRN
  Administered 2024-04-16 – 2024-04-27 (×11): 10 mg via ORAL
  Filled 2024-04-10 (×11): qty 1

## 2024-04-10 MED ORDER — MORPHINE SULFATE (PF) 4 MG/ML IV SOLN
4.0000 mg | Freq: Once | INTRAVENOUS | Status: AC
  Administered 2024-04-10: 4 mg via INTRAVENOUS
  Filled 2024-04-10: qty 1

## 2024-04-10 MED ORDER — METHYLPREDNISOLONE SODIUM SUCC 125 MG IJ SOLR
125.0000 mg | Freq: Once | INTRAMUSCULAR | Status: AC
  Administered 2024-04-10: 125 mg via INTRAVENOUS
  Filled 2024-04-10: qty 2

## 2024-04-10 MED ORDER — LACTATED RINGERS IV SOLN
INTRAVENOUS | Status: AC
Start: 2024-04-10 — End: 2024-04-11

## 2024-04-10 NOTE — ED Notes (Signed)
 Patient is resting comfortably.

## 2024-04-10 NOTE — ED Triage Notes (Signed)
 Coming from home, Hx of COPD, not feeling well for the last three days, SOB got worse and pt decide to check in.   Complains of severe pain on left abdomen, chest that irradiates to left arm, with tingling on left hand.  Pain 10 out 10, pt alert, however, looks uncomfortable.

## 2024-04-10 NOTE — Progress Notes (Signed)
 Pharmacy Antibiotic Note  Wesley [name] is a 58 y.o. male with hx COPD and chronic pancreatitis who presented to the ED on 04/10/2024 with c/o cough, SOB and abdominal pain.  Pharmacy has been consulted to dose vancomycin and cefepime for suspected PNA and/or intra-abdominal infection.  - 5/11 abd CT: Chronic fluid collections in and around the spleen . 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  Atelectasis and small volume ascites at the left base.   5/11 bcx x2: 5/11 COVID+  Plan: - cefepime 2gm and vancomycin 1750 mg IV x1 given in the ED this morning - cefepime 2gm q8h - vancomycin 1000 mg IV q12h for est AUC 494 - flagyl  500 mg q12h per MD  ___________________________________________  Temp (24hrs), Avg:98.6 F (37 C), Min:97.9 F (36.6 C), Max:99.2 F (37.3 C)  Recent Labs  Lab 04/10/24 0543 04/10/24 0624 04/10/24 0836  WBC 20.9*  --   --   CREATININE 0.97  --   --   LATICACIDVEN  --  0.8 0.8    CrCl cannot be calculated (Unknown ideal weight.).    No Known Allergies   Thank you for allowing pharmacy to be a part of this patient's care.  Spurgeon Dyer 04/10/2024 9:20 AM
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
## 2024-04-10 NOTE — Progress Notes (Signed)
 ED Pharmacy Antibiotic Sign Off An antibiotic consult was received from an ED provider for vancomycin and cefepime per pharmacy dosing for bacteremia. A chart review was completed to assess appropriateness.   The following one time order(s) were placed:  - cefepime 2gm  - vancomycin 1750 mg   Further antibiotic and/or antibiotic pharmacy consults should be ordered by the admitting provider if indicated.   Thank you for allowing pharmacy to be a part of this patient's care.   Spurgeon Dyer, [facility]  Clinical Pharmacist 04/10/24 7:20 AM

## 2024-04-10 NOTE — Plan of Care (Signed)
   Problem: Education: Goal: Knowledge of risk factors and measures for prevention of condition will improve Outcome: Progressing   Problem: Coping: Goal: Psychosocial and spiritual needs will be supported Outcome: Progressing

## 2024-04-10 NOTE — ED Notes (Signed)
 Pt given call bell and in bed in nad at this time

## 2024-04-10 NOTE — ED Provider Notes (Signed)
 Hospitalist service is aware of case.  Broad-spectrum antibiotics initiated for treatment of possible pneumonia, superinfection of pancreatic pseudocyst.     Burnette Carte, MD 04/10/24 [phone]

## 2024-04-11 ENCOUNTER — Inpatient Hospital Stay (HOSPITAL_COMMUNITY)

## 2024-04-11 DIAGNOSIS — Q438 Other specified congenital malformations of intestine: Secondary | ICD-10-CM | POA: Diagnosis not present

## 2024-04-11 DIAGNOSIS — D75839 Thrombocytosis, unspecified: Secondary | ICD-10-CM | POA: Diagnosis not present

## 2024-04-11 DIAGNOSIS — G8929 Other chronic pain: Secondary | ICD-10-CM | POA: Diagnosis not present

## 2024-04-11 DIAGNOSIS — J45909 Unspecified asthma, uncomplicated: Secondary | ICD-10-CM | POA: Diagnosis not present

## 2024-04-11 DIAGNOSIS — E876 Hypokalemia: Secondary | ICD-10-CM | POA: Diagnosis not present

## 2024-04-11 DIAGNOSIS — G459 Transient cerebral ischemic attack, unspecified: Secondary | ICD-10-CM | POA: Diagnosis not present

## 2024-04-11 DIAGNOSIS — R918 Other nonspecific abnormal finding of lung field: Secondary | ICD-10-CM | POA: Diagnosis not present

## 2024-04-11 DIAGNOSIS — K5903 Drug induced constipation: Secondary | ICD-10-CM | POA: Diagnosis not present

## 2024-04-11 DIAGNOSIS — K219 Gastro-esophageal reflux disease without esophagitis: Secondary | ICD-10-CM | POA: Diagnosis not present

## 2024-04-11 DIAGNOSIS — R188 Other ascites: Secondary | ICD-10-CM | POA: Diagnosis not present

## 2024-04-11 DIAGNOSIS — K5150 Left sided colitis without complications: Secondary | ICD-10-CM | POA: Diagnosis not present

## 2024-04-11 DIAGNOSIS — R932 Abnormal findings on diagnostic imaging of liver and biliary tract: Secondary | ICD-10-CM | POA: Diagnosis not present

## 2024-04-11 DIAGNOSIS — R1032 Left lower quadrant pain: Secondary | ICD-10-CM | POA: Diagnosis not present

## 2024-04-11 DIAGNOSIS — K651 Peritoneal abscess: Secondary | ICD-10-CM | POA: Diagnosis not present

## 2024-04-11 DIAGNOSIS — R1012 Left upper quadrant pain: Secondary | ICD-10-CM | POA: Diagnosis not present

## 2024-04-11 DIAGNOSIS — K860 Alcohol-induced chronic pancreatitis: Secondary | ICD-10-CM | POA: Diagnosis present

## 2024-04-11 DIAGNOSIS — Z9049 Acquired absence of other specified parts of digestive tract: Secondary | ICD-10-CM | POA: Diagnosis not present

## 2024-04-11 DIAGNOSIS — G629 Polyneuropathy, unspecified: Secondary | ICD-10-CM | POA: Diagnosis present

## 2024-04-11 DIAGNOSIS — E46 Unspecified protein-calorie malnutrition: Secondary | ICD-10-CM | POA: Diagnosis not present

## 2024-04-11 DIAGNOSIS — U071 COVID-19: Secondary | ICD-10-CM | POA: Diagnosis not present

## 2024-04-11 DIAGNOSIS — K8689 Other specified diseases of pancreas: Secondary | ICD-10-CM | POA: Diagnosis not present

## 2024-04-11 DIAGNOSIS — I728 Aneurysm of other specified arteries: Secondary | ICD-10-CM | POA: Diagnosis not present

## 2024-04-11 DIAGNOSIS — K861 Other chronic pancreatitis: Secondary | ICD-10-CM | POA: Diagnosis not present

## 2024-04-11 DIAGNOSIS — N400 Enlarged prostate without lower urinary tract symptoms: Secondary | ICD-10-CM | POA: Diagnosis present

## 2024-04-11 DIAGNOSIS — R0902 Hypoxemia: Secondary | ICD-10-CM | POA: Diagnosis not present

## 2024-04-11 DIAGNOSIS — Z4682 Encounter for fitting and adjustment of non-vascular catheter: Secondary | ICD-10-CM | POA: Diagnosis not present

## 2024-04-11 DIAGNOSIS — R140 Abdominal distension (gaseous): Secondary | ICD-10-CM | POA: Diagnosis not present

## 2024-04-11 DIAGNOSIS — C3411 Malignant neoplasm of upper lobe, right bronchus or lung: Secondary | ICD-10-CM | POA: Diagnosis not present

## 2024-04-11 DIAGNOSIS — R0602 Shortness of breath: Secondary | ICD-10-CM | POA: Diagnosis not present

## 2024-04-11 DIAGNOSIS — J441 Chronic obstructive pulmonary disease with (acute) exacerbation: Secondary | ICD-10-CM | POA: Diagnosis not present

## 2024-04-11 DIAGNOSIS — J189 Pneumonia, unspecified organism: Secondary | ICD-10-CM | POA: Diagnosis not present

## 2024-04-11 DIAGNOSIS — T40605A Adverse effect of unspecified narcotics, initial encounter: Secondary | ICD-10-CM | POA: Diagnosis not present

## 2024-04-11 DIAGNOSIS — R109 Unspecified abdominal pain: Secondary | ICD-10-CM | POA: Diagnosis not present

## 2024-04-11 DIAGNOSIS — E440 Moderate protein-calorie malnutrition: Secondary | ICD-10-CM | POA: Diagnosis not present

## 2024-04-11 DIAGNOSIS — E871 Hypo-osmolality and hyponatremia: Secondary | ICD-10-CM | POA: Diagnosis not present

## 2024-04-11 DIAGNOSIS — J440 Chronic obstructive pulmonary disease with acute lower respiratory infection: Secondary | ICD-10-CM | POA: Diagnosis not present

## 2024-04-11 DIAGNOSIS — J439 Emphysema, unspecified: Secondary | ICD-10-CM | POA: Diagnosis present

## 2024-04-11 DIAGNOSIS — K5289 Other specified noninfective gastroenteritis and colitis: Secondary | ICD-10-CM | POA: Diagnosis present

## 2024-04-11 DIAGNOSIS — D649 Anemia, unspecified: Secondary | ICD-10-CM | POA: Diagnosis not present

## 2024-04-11 DIAGNOSIS — K859 Acute pancreatitis without necrosis or infection, unspecified: Secondary | ICD-10-CM | POA: Diagnosis not present

## 2024-04-11 DIAGNOSIS — K567 Ileus, unspecified: Secondary | ICD-10-CM | POA: Diagnosis not present

## 2024-04-11 DIAGNOSIS — K5730 Diverticulosis of large intestine without perforation or abscess without bleeding: Secondary | ICD-10-CM | POA: Diagnosis not present

## 2024-04-11 DIAGNOSIS — R079 Chest pain, unspecified: Secondary | ICD-10-CM | POA: Diagnosis not present

## 2024-04-11 DIAGNOSIS — R933 Abnormal findings on diagnostic imaging of other parts of digestive tract: Secondary | ICD-10-CM | POA: Diagnosis not present

## 2024-04-11 DIAGNOSIS — K863 Pseudocyst of pancreas: Secondary | ICD-10-CM | POA: Diagnosis not present

## 2024-04-11 DIAGNOSIS — R1084 Generalized abdominal pain: Secondary | ICD-10-CM | POA: Diagnosis not present

## 2024-04-11 DIAGNOSIS — K5669 Other partial intestinal obstruction: Secondary | ICD-10-CM | POA: Diagnosis not present

## 2024-04-11 DIAGNOSIS — N3289 Other specified disorders of bladder: Secondary | ICD-10-CM | POA: Diagnosis not present

## 2024-04-11 DIAGNOSIS — Z8719 Personal history of other diseases of the digestive system: Secondary | ICD-10-CM | POA: Diagnosis not present

## 2024-04-11 DIAGNOSIS — J90 Pleural effusion, not elsewhere classified: Secondary | ICD-10-CM | POA: Diagnosis not present

## 2024-04-11 LAB — COMPREHENSIVE METABOLIC PANEL WITH GFR
ALT: 14 U/L (ref 0–44)
AST: 15 U/L (ref 15–41)
Albumin: 3 g/dL — ABNORMAL LOW (ref 3.5–5.0)
Alkaline Phosphatase: 125 U/L (ref 38–126)
Anion gap: 9 (ref 5–15)
BUN: 16 mg/dL (ref 6–20)
CO2: 25 mmol/L (ref 22–32)
Calcium: 8.6 mg/dL — ABNORMAL LOW (ref 8.9–10.3)
Chloride: 98 mmol/L (ref 98–111)
Creatinine, Ser: 1.02 mg/dL (ref 0.61–1.24)
GFR, Estimated: >60 mL/min (ref >60)
Glucose, Bld: 146 mg/dL — ABNORMAL HIGH (ref 70–99)
Potassium: 3.8 mmol/L (ref 3.5–5.1)
Sodium: 132 mmol/L — ABNORMAL LOW (ref 135–145)
Total Bilirubin: 1.1 mg/dL (ref 0.0–1.2)
Total Protein: 7.1 g/dL (ref 6.5–8.1)

## 2024-04-11 LAB — CBC
HCT: 39.6 % (ref 39.0–52.0)
Hemoglobin: 13.1 g/dL (ref 13.0–17.0)
MCH: 30 pg (ref 26.0–34.0)
MCHC: 33.1 g/dL (ref 30.0–36.0)
MCV: 90.8 fL (ref 80.0–100.0)
Platelets: 280 10*3/uL (ref 150–400)
RBC: 4.36 MIL/uL (ref 4.22–5.81)
RDW: 17.1 % — ABNORMAL HIGH (ref 11.5–15.5)
WBC: 18.5 10*3/uL — ABNORMAL HIGH (ref 4.0–10.5)
nRBC: 0 % (ref 0.0–0.2)

## 2024-04-11 LAB — VITAMIN B12: Vitamin B-12: 3062 pg/mL — ABNORMAL HIGH (ref 180–914)

## 2024-04-11 MED ORDER — HYDRALAZINE HCL 20 MG/ML IJ SOLN: 10.0000 mg | INTRAMUSCULAR | Status: DC | PRN

## 2024-04-11 MED ORDER — GLUCAGON HCL RDNA (DIAGNOSTIC) 1 MG IJ SOLR: 1.0000 mg | INTRAMUSCULAR | Status: DC | PRN

## 2024-04-11 MED ORDER — SENNOSIDES-DOCUSATE SODIUM 8.6-50 MG PO TABS
1.0000 | ORAL_TABLET | Freq: Every evening | ORAL | Status: DC | PRN
  Administered 2024-04-12: 1 via ORAL
  Filled 2024-04-11: qty 1

## 2024-04-11 MED ORDER — IPRATROPIUM-ALBUTEROL 0.5-2.5 (3) MG/3ML IN SOLN: 3.0000 mL | RESPIRATORY_TRACT | Status: DC | PRN

## 2024-04-11 MED ORDER — METOPROLOL TARTRATE 5 MG/5ML IV SOLN
5.0000 mg | INTRAVENOUS | Status: DC | PRN
  Administered 2024-04-23 – 2024-04-24 (×2): 5 mg via INTRAVENOUS
  Filled 2024-04-11 (×2): qty 5

## 2024-04-11 MED ORDER — GUAIFENESIN 100 MG/5ML PO LIQD
5.0000 mL | ORAL | Status: DC | PRN
  Filled 2024-04-11: qty 10

## 2024-04-11 NOTE — Progress Notes (Signed)
 Patients IV infiltrated after giving Vancomycin. IV discontinued and IV site swollen. See infiltration grade. On call Sharion Davidson, NP notified. Care plan continued.

## 2024-04-11 NOTE — TOC Initial Note (Addendum)
 Transition of Care [facility]) - Initial/Assessment Note    Patient Details  Name: Wesley [name] MRN: [id number] Date of Birth: 01-31-1966  [facility] CM/SW Contact:    Kathryn Parish, RN Phone Number: 04/11/2024, 10:41 AM  Clinical Narrative:                 MOON. PTA lives in a house with SO Charmaine Coop [phone]. Verified PCP and Insurance. No DME, HH, oxygen or SDOH. Patients mother will transport home at discharge [facility] (Mother) [phone] . TOC signing off  Expected Discharge Plan: Home/Self Care Barriers to Discharge: No Barriers Identified   Patient Goals and CMS Choice            Expected Discharge Plan and Services   Discharge Planning Services: CM Consult   Living arrangements for the past 2 months: Single Family Home                 DME Arranged: N/A DME Agency: NA       HH Arranged: NA HH Agency: NA        Prior Living Arrangements/Services Living arrangements for the past 2 months: Single Family Home Lives with:: Significant Other Charmaine Coop [phone]) Patient language and need for interpreter reviewed:: Yes Do you feel safe going back to the place where you live?: Yes      Need for Family Participation in Patient Care: No (Comment) Care giver support system in place?: Yes (comment)   Criminal Activity/Legal Involvement Pertinent to Current Situation/Hospitalization: No - Comment as needed  Activities of Daily Living   ADL Screening (condition at time of admission) Independently performs ADLs?: Yes (appropriate for developmental age) Is the patient deaf or have difficulty hearing?: No Does the patient have difficulty seeing, even when wearing glasses/contacts?: No Does the patient have difficulty concentrating, remembering, or making decisions?: No  Permission Sought/Granted Permission sought to share information with : Case Manager Permission granted to share information with : Yes, Verbal Permission Granted               Emotional Assessment Appearance:: Appears stated age, Appears older than stated age Attitude/Demeanor/Rapport: Engaged Affect (typically observed): Appropriate Orientation: : Oriented to Self, Oriented to Place, Oriented to  Time, Oriented to Situation   Psych Involvement: No (comment)  Admission diagnosis:  Abdominal distention [R14.0] Pancreatic pseudocyst [K86.3] COPD exacerbation (HCC) [J44.1] Left lower quadrant abdominal pain [R10.32] Patient Active Problem List   Diagnosis Date Noted   Primary malignant neoplasm of bronchus of right upper lobe (HCC) 09/07/2023   Neck pain 09/03/2023   Pleuritic chest pain 06/18/2023   Tobacco use 04/06/2023   Mediastinal adenopathy 10/27/2022   Abnormal CT of the chest 10/21/2022   Nonintractable headache 09/18/2022   Incidental lung nodule, greater than or equal to 8mm 08/28/2022   Pancreatic pseudocyst 08/28/2022   Splenic trauma, subsequent encounter 08/27/2022   Splenic abscess 08/14/2022   Splenic trauma 08/13/2022   Aortic atherosclerosis (HCC) 06/11/2022   Oral candidiasis 06/11/2022   Erectile dysfunction 11/05/2021   Alcoholic peripheral neuropathy (HCC) 03/13/2021   PAD (peripheral artery disease) (HCC) 03/13/2021   Other intervertebral disc degeneration, lumbar region 01/29/2021   Chronic pancreatitis (HCC) 12/30/2020   Chronic midline low back pain with bilateral sciatica 12/13/2020   Nocturia associated with benign prostatic hyperplasia 12/13/2020   Neurogenic claudication due to lumbar spinal stenosis 12/13/2020   Chronic viral hepatitis C (HCC) 05/04/2019   Pancreatitis 04/10/2019   Chronic thrombosis of splenic  vein 04/10/2019   Alcohol dependence in remission (HCC) 04/10/2019   Chronic pain syndrome 02/08/2018   Abdominal pain    SMOKER 08/07/2010   COPD with chronic bronchitis (HCC) 08/07/2010   PCP:  [name], DO Pharmacy:   [facility] [phone] - [place], [place] - [address] [place] [address] Phone: [phone] Fax: [phone]  [facility] - [place], [place] - [address] [place] [address] Phone: [phone] Fax: [phone]  [place] - [facility] [address] [place] [address] Phone: [phone] Fax: [phone]     Social Drivers of Health (SDOH) Social History: SDOH Screenings   Food Insecurity: No Food Insecurity (04/10/2024)  Housing: Low Risk  (04/10/2024)  Transportation Needs: No Transportation Needs (04/10/2024)  Utilities: Not At Risk (04/10/2024)  Alcohol Screen: Low Risk  (04/06/2023)  Depression (PHQ2-9): Low Risk  (12/15/2023)  Financial Resource Strain: Medium Risk (04/06/2023)  Physical Activity: Insufficiently Active (04/06/2023)  Social Connections: Socially Isolated (12/15/2023)  Stress: Stress Concern Present (04/06/2023)  Tobacco Use: High Risk (04/10/2024)   SDOH Interventions:     Readmission Risk Interventions     No data to display
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
## 2024-04-11 NOTE — Care Management Obs Status (Signed)
 MEDICARE OBSERVATION STATUS NOTIFICATION   Patient Details  Name: Wesley [name] MRN: [id number] Date of Birth: 07-17-1966   Medicare Observation Status Notification Given:  Yes    Kathryn Parish, RN 04/11/2024, 10:37 AM

## 2024-04-11 NOTE — Plan of Care (Signed)
  Problem: Education: Goal: Knowledge of risk factors and measures for prevention of condition will improve Outcome: Progressing   Problem: Coping: Goal: Psychosocial and spiritual needs will be supported Outcome: Progressing   Problem: Respiratory: Goal: Will maintain a patent airway Outcome: Progressing   Problem: Clinical Measurements: Goal: Will remain free from infection Outcome: Progressing   Problem: Pain Managment: Goal: General experience of comfort will improve and/or be controlled Outcome: Progressing   Problem: Safety: Goal: Ability to remain free from injury will improve Outcome: Progressing
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
## 2024-04-12 DIAGNOSIS — K863 Pseudocyst of pancreas: Secondary | ICD-10-CM | POA: Diagnosis not present

## 2024-04-12 LAB — COMPREHENSIVE METABOLIC PANEL WITH GFR
ALT: 15 U/L (ref 0–44)
AST: 20 U/L (ref 15–41)
Albumin: 2.9 g/dL — ABNORMAL LOW (ref 3.5–5.0)
Alkaline Phosphatase: 115 U/L (ref 38–126)
Anion gap: 11 (ref 5–15)
BUN: 17 mg/dL (ref 6–20)
CO2: 22 mmol/L (ref 22–32)
Calcium: 8.6 mg/dL — ABNORMAL LOW (ref 8.9–10.3)
Chloride: 103 mmol/L (ref 98–111)
Creatinine, Ser: 0.76 mg/dL (ref 0.61–1.24)
GFR, Estimated: >60 mL/min (ref >60)
Glucose, Bld: 100 mg/dL — ABNORMAL HIGH (ref 70–99)
Potassium: 4 mmol/L (ref 3.5–5.1)
Sodium: 136 mmol/L (ref 135–145)
Total Bilirubin: 0.6 mg/dL (ref 0.0–1.2)
Total Protein: 7.1 g/dL (ref 6.5–8.1)

## 2024-04-12 LAB — CBC
HCT: 42 % (ref 39.0–52.0)
Hemoglobin: 13.5 g/dL (ref 13.0–17.0)
MCH: 29.4 pg (ref 26.0–34.0)
MCHC: 32.1 g/dL (ref 30.0–36.0)
MCV: 91.5 fL (ref 80.0–100.0)
Platelets: 311 10*3/uL (ref 150–400)
RBC: 4.59 MIL/uL (ref 4.22–5.81)
RDW: 17.4 % — ABNORMAL HIGH (ref 11.5–15.5)
WBC: 13.9 10*3/uL — ABNORMAL HIGH (ref 4.0–10.5)
nRBC: 0 % (ref 0.0–0.2)

## 2024-04-12 LAB — MAGNESIUM: Magnesium: 2.5 mg/dL — ABNORMAL HIGH (ref 1.7–2.4)

## 2024-04-12 LAB — PHOSPHORUS: Phosphorus: 1.5 mg/dL — ABNORMAL LOW (ref 2.5–4.6)

## 2024-04-12 MED ORDER — K PHOS MONO-SOD PHOS DI & MONO 155-852-130 MG PO TABS
500.0000 mg | ORAL_TABLET | ORAL | Status: AC
  Administered 2024-04-12 (×2): 500 mg via ORAL
  Filled 2024-04-12 (×2): qty 2

## 2024-04-12 MED ORDER — SODIUM CHLORIDE 0.9 % IV SOLN: INTRAVENOUS | Status: AC

## 2024-04-12 MED ORDER — SODIUM CHLORIDE 0.9 % IV SOLN
2.0000 g | Freq: Every day | INTRAVENOUS | Status: DC
  Administered 2024-04-12 – 2024-04-18 (×7): 2 g via INTRAVENOUS
  Filled 2024-04-12 (×6): qty 20

## 2024-04-12 NOTE — Plan of Care (Signed)
 SATURATION QUALIFICATIONS: (This note is used to comply with regulatory documentation for home oxygen)  Patient Saturations on Room Air at Rest = 94%  Patient Saturations on Room Air while Ambulating = 92%  Patient Saturations on  0 Liters of oxygen while Ambulating = 92%  Please briefly explain why patient needs home oxygen:  Pt saturations while ambulating Room air
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
## 2024-04-13 DIAGNOSIS — K863 Pseudocyst of pancreas: Secondary | ICD-10-CM | POA: Diagnosis not present

## 2024-04-13 LAB — COMPREHENSIVE METABOLIC PANEL WITH GFR
ALT: 15 U/L (ref 0–44)
AST: 20 U/L (ref 15–41)
Albumin: 2.8 g/dL — ABNORMAL LOW (ref 3.5–5.0)
Alkaline Phosphatase: 104 U/L (ref 38–126)
Anion gap: 11 (ref 5–15)
BUN: 16 mg/dL (ref 6–20)
CO2: 23 mmol/L (ref 22–32)
Calcium: 8.3 mg/dL — ABNORMAL LOW (ref 8.9–10.3)
Chloride: 103 mmol/L (ref 98–111)
Creatinine, Ser: 1.02 mg/dL (ref 0.61–1.24)
GFR, Estimated: >60 mL/min (ref >60)
Glucose, Bld: 108 mg/dL — ABNORMAL HIGH (ref 70–99)
Potassium: 3.4 mmol/L — ABNORMAL LOW (ref 3.5–5.1)
Sodium: 137 mmol/L (ref 135–145)
Total Bilirubin: 0.9 mg/dL (ref 0.0–1.2)
Total Protein: 7.2 g/dL (ref 6.5–8.1)

## 2024-04-13 LAB — CBC
HCT: 41.2 % (ref 39.0–52.0)
Hemoglobin: 13.7 g/dL (ref 13.0–17.0)
MCH: 29.8 pg (ref 26.0–34.0)
MCHC: 33.3 g/dL (ref 30.0–36.0)
MCV: 89.8 fL (ref 80.0–100.0)
Platelets: 296 10*3/uL (ref 150–400)
RBC: 4.59 MIL/uL (ref 4.22–5.81)
RDW: 17 % — ABNORMAL HIGH (ref 11.5–15.5)
WBC: 13.7 10*3/uL — ABNORMAL HIGH (ref 4.0–10.5)
nRBC: 0 % (ref 0.0–0.2)

## 2024-04-13 LAB — MAGNESIUM: Magnesium: 2.1 mg/dL (ref 1.7–2.4)

## 2024-04-13 MED ORDER — FLEET ENEMA RE ENEM
1.0000 | ENEMA | Freq: Once | RECTAL | Status: AC
  Administered 2024-04-13: 1 via RECTAL
  Filled 2024-04-13: qty 1

## 2024-04-13 NOTE — Progress Notes (Signed)
 PROGRESS NOTE    Wesley [name]  UJW:[id number]  DOB: 07/22/1966  DOA: 04/10/2024 PCP: [name], DO Outpatient Specialists:   Hospital course:  58 year old with history of suspected lung cancer, GERD, COPD, chronic pancreatitis with pseudocyst comes to the hospital with complaints of cough and abdominal pain. Patient found to have COPD exacerbation in the setting of COVID infection along with acute on chronic pancreatitis complicated by pseudocyst. Slowly patient's diet was advanced as he was tolerating. Given bronchodilators.   Subjective:  Patient states he has abdominal pain as he has had before, radiating to left flank.  Notes that the pain is present whether I palpate or not.  Notes shortness of breath is improved.  Also complains of not having had a bowel movement for several days.   Objective: Vitals:   04/12/24 0452 04/12/24 1233 04/12/24 1925 04/13/24 0404  BP: (!) 143/99 138/80 (!) 154/87 (!) 162/94  Pulse: 88 85 90 88  Resp: 18 16 17 17   Temp: 98.2 F (36.8 C) 98.7 F (37.1 C) 98.1 F (36.7 C) 97.9 F (36.6 C)  TempSrc: Oral Oral Oral Oral  SpO2: 93% 92% 94% 93%  Weight:      Height:        Intake/Output Summary (Last 24 hours) at 04/13/2024 1503 Last data filed at 04/13/2024 1500 Gross per 24 hour  Intake 3581.28 ml  Output --  Net 3581.28 ml   Filed Weights   04/10/24 0926 04/10/24 1415  Weight: 79.8 kg 79.4 kg     Exam:  General: Comfortable appearing gentleman sitting up in bed in NAD Eyes: sclera anicteric, conjuctiva mild injection bilaterally CVS: S1-S2, regular  Respiratory: Small lung fields, CTA GI: Abdomen is protuberant, somewhat distended but tympanitic, minimal tenderness to light palpation, no rebound tenderness, no epigastric tenderness. LE: Warm and well-perfused Neuro: A/O x 3,  grossly nonfocal.  Psych: patient is logical and coherent, judgement and insight appear normal, mood and affect appropriate to situation.  Data  Reviewed:  Basic Metabolic Panel: Recent Labs  Lab 04/10/24 0543 04/11/24 0417 04/12/24 0435 04/13/24 0438  NA 135 132* 136 137  K 3.5 3.8 4.0 3.4*  CL 98 98 103 103  CO2 22 25 22 23   GLUCOSE 139* 146* 100* 108*  BUN 10 16 17 16   CREATININE 0.97 1.02 0.76 1.02  CALCIUM 9.0 8.6* 8.6* 8.3*  MG 2.2  --  2.5* 2.1  PHOS  --   --  1.5*  --     CBC: Recent Labs  Lab 04/10/24 0543 04/11/24 0417 04/12/24 0435 04/13/24 0438  WBC 20.9* 18.5* 13.9* 13.7*  HGB 14.5 13.1 13.5 13.7  HCT 43.8 39.6 42.0 41.2  MCV 88.7 90.8 91.5 89.8  PLT 273 280 311 296     Scheduled Meds:  amitriptyline   50 mg Oral QHS   cyanocobalamin   1,000 mcg Oral Daily   enoxaparin  (LOVENOX ) injection  40 mg Subcutaneous Q24H   fluticasone  furoate-vilanterol  1 puff Inhalation Daily   lipase/protease/amylase  72,000 Units Oral TID WC   And   lipase/protease/amylase  36,000 Units Oral With snacks   multivitamin with minerals  1 tablet Oral Daily   pantoprazole   40 mg Oral BID AC   predniSONE   40 mg Oral Q breakfast   pregabalin   150 mg Oral TID   umeclidinium bromide   1 puff Inhalation Daily   Continuous Infusions:  cefTRIAXone  (ROCEPHIN )  IV 2 g (04/13/24 1018)   metronidazole  500 mg (04/13/24 0828)  Assessment & Plan:   Acute on chronic pancreatitis with history of pseudocyst Abdominal pain Constipation Continue conservative management for acute on chronic pancreatitis Patient tolerating clear liquids, advance to full liquids tomorrow if he tolerates Continue ceftriaxone  and metronidazole  Corvera #4 given pseudocyst and leukocytosis Fleets enema ineffective although he did pass some gas, will try soapsuds enema today  COVID infection Due to exacerbation of COPD Doing well on oral prednisone  and inhaled bronchodilators and flutter valve   Copied and pasted from previous note: Intermittent blurry vision - MRI brain is negative.  Recommend outpatient follow-up with ophthalmology    Suspected lung cancer Right upper lobe lung nodule - Per patient he had a PET scan however last 2 biopsies were nondiagnostic.  He will need to follow-up outpatient.  Has seen radiation oncology recently who recommends repeating another CT scan in 6 months.   GERD/gastritis - PPI   BPH - Flomax     DVT prophylaxis: Lovenox  Code Status: Full Family Communication: None today     Studies: MR BRAIN WO CONTRAST Result Date: 04/11/2024 CLINICAL DATA:  Transient ischemic attack EXAM: MRI HEAD WITHOUT CONTRAST TECHNIQUE: Multiplanar, multiecho pulse sequences of the brain and surrounding structures were obtained without intravenous contrast. COMPARISON:  None Available. FINDINGS: Brain: No acute infarct, mass effect or extra-axial collection. No acute or chronic hemorrhage. There is multifocal hyperintense T2-weighted signal within the white matter. Parenchymal volume and CSF spaces are normal. The midline structures are normal. Vascular: Normal flow voids. Skull and upper cervical spine: Normal calvarium and skull base. Visualized upper cervical spine and soft tissues are normal. Sinuses/Orbits:No paranasal sinus fluid levels or advanced mucosal thickening. No mastoid or middle ear effusion. Normal orbits. IMPRESSION: 1. No acute intracranial abnormality. 2. Chronic microangiopathic white matter changes. Electronically Signed   By: Juanetta Nordmann M.D.   On: 04/11/2024 23:56    Principal Problem:   Pancreatic pseudocyst     [employer], [facility]  If 7PM-7AM, please contact night-coverage [email or web address]   LOS: 2 days

## 2024-04-13 NOTE — Plan of Care (Signed)

## 2024-04-14 DIAGNOSIS — K863 Pseudocyst of pancreas: Secondary | ICD-10-CM | POA: Diagnosis not present

## 2024-04-14 LAB — CBC
HCT: 39.6 % (ref 39.0–52.0)
Hemoglobin: 12.9 g/dL — ABNORMAL LOW (ref 13.0–17.0)
MCH: 28.9 pg (ref 26.0–34.0)
MCHC: 32.6 g/dL (ref 30.0–36.0)
MCV: 88.8 fL (ref 80.0–100.0)
Platelets: 295 10*3/uL (ref 150–400)
RBC: 4.46 MIL/uL (ref 4.22–5.81)
RDW: 16.8 % — ABNORMAL HIGH (ref 11.5–15.5)
WBC: 17 10*3/uL — ABNORMAL HIGH (ref 4.0–10.5)
nRBC: 0 % (ref 0.0–0.2)

## 2024-04-14 LAB — COMPREHENSIVE METABOLIC PANEL WITH GFR
ALT: 16 U/L (ref 0–44)
AST: 22 U/L (ref 15–41)
Albumin: 2.7 g/dL — ABNORMAL LOW (ref 3.5–5.0)
Alkaline Phosphatase: 97 U/L (ref 38–126)
Anion gap: 10 (ref 5–15)
BUN: 15 mg/dL (ref 6–20)
CO2: 24 mmol/L (ref 22–32)
Calcium: 8.1 mg/dL — ABNORMAL LOW (ref 8.9–10.3)
Chloride: 99 mmol/L (ref 98–111)
Creatinine, Ser: 0.92 mg/dL (ref 0.61–1.24)
GFR, Estimated: >60 mL/min (ref >60)
Glucose, Bld: 105 mg/dL — ABNORMAL HIGH (ref 70–99)
Potassium: 3.3 mmol/L — ABNORMAL LOW (ref 3.5–5.1)
Sodium: 133 mmol/L — ABNORMAL LOW (ref 135–145)
Total Bilirubin: 1 mg/dL (ref 0.0–1.2)
Total Protein: 7.1 g/dL (ref 6.5–8.1)

## 2024-04-14 LAB — MAGNESIUM: Magnesium: 2.2 mg/dL (ref 1.7–2.4)

## 2024-04-14 LAB — LIPASE, BLOOD: Lipase: 182 U/L — ABNORMAL HIGH (ref 11–51)

## 2024-04-14 MED ORDER — SMOG ENEMA: 960.0000 mL | Freq: Once | RECTAL | Status: DC

## 2024-04-14 MED ORDER — MAGNESIUM HYDROXIDE 400 MG/5ML PO SUSP
30.0000 mL | ORAL | Status: AC
  Administered 2024-04-14: 30 mL via ORAL
  Filled 2024-04-14: qty 30

## 2024-04-14 MED ORDER — POTASSIUM CHLORIDE CRYS ER 20 MEQ PO TBCR
40.0000 meq | EXTENDED_RELEASE_TABLET | Freq: Once | ORAL | Status: AC
  Administered 2024-04-14: 40 meq via ORAL
  Filled 2024-04-14: qty 2

## 2024-04-14 MED ORDER — POLYETHYLENE GLYCOL 3350 17 G PO PACK: 17.0000 g | PACK | Freq: Every day | ORAL | Status: DC

## 2024-04-14 MED ORDER — SENNOSIDES-DOCUSATE SODIUM 8.6-50 MG PO TABS
2.0000 | ORAL_TABLET | Freq: Every day | ORAL | Status: DC
  Administered 2024-04-14: 2 via ORAL
  Filled 2024-04-14: qty 2

## 2024-04-14 MED ORDER — LACTATED RINGERS IV SOLN: INTRAVENOUS | Status: AC

## 2024-04-14 NOTE — Progress Notes (Signed)
 PROGRESS NOTE    Wesley [name]  WUJ:[id number]  DOB: 30-Nov-1966  DOA: 04/10/2024 PCP: [name], DO Outpatient Specialists:   Hospital course:  58 year old with history of suspected lung cancer, GERD, COPD, chronic pancreatitis with pseudocyst comes to the hospital with complaints of cough and abdominal pain. Patient found to have COPD exacerbation in the setting of COVID infection along with acute on chronic pancreatitis complicated by pseudocyst. Slowly patient's diet was advanced as he was tolerating. Given bronchodilators.   Subjective:  Patient states he is doing fine but is awaiting more pain medications.  Still having abdominal pain but perhaps somewhat less than yesterday   Objective: Vitals:   04/12/24 1925 04/13/24 0404 04/13/24 2307 04/14/24 0516  BP: (!) 154/87 (!) 162/94 (!) 143/93 (!) 138/93  Pulse: 90 88 89 85  Resp: 17 17 17 18   Temp: 98.1 F (36.7 C) 97.9 F (36.6 C) 98.9 F (37.2 C) 99.2 F (37.3 C)  TempSrc: Oral Oral Oral Oral  SpO2: 94% 93% 92% 94%  Weight:      Height:        Intake/Output Summary (Last 24 hours) at 04/14/2024 1603 Last data filed at 04/14/2024 1300 Gross per 24 hour  Intake --  Output 1 ml  Net -1 ml   Filed Weights   04/10/24 0926 04/10/24 1415  Weight: 79.8 kg 79.4 kg     Exam:  General: Comfortable appearing gentleman sitting up in bed in NAD Eyes: sclera anicteric, conjuctiva mild injection bilaterally CVS: S1-S2, regular  Respiratory: Small lung fields, CTA GI: Abdomen is significantly less distended, it is soft although still has air, no tenderness to light palpation, no rebound tenderness, no guarding. LE: Warm and well-perfused Neuro: A/O x 3,  grossly nonfocal.  Psych: patient is logical and coherent, judgement and insight appear normal, mood and affect appropriate to situation.  Data Reviewed:  Basic Metabolic Panel: Recent Labs  Lab 04/10/24 0543 04/11/24 0417 04/12/24 0435 04/13/24 0438  04/14/24 0420  NA 135 132* 136 137 133*  K 3.5 3.8 4.0 3.4* 3.3*  CL 98 98 103 103 99  CO2 22 25 22 23 24   GLUCOSE 139* 146* 100* 108* 105*  BUN 10 16 17 16 15   CREATININE 0.97 1.02 0.76 1.02 0.92  CALCIUM 9.0 8.6* 8.6* 8.3* 8.1*  MG 2.2  --  2.5* 2.1 2.2  PHOS  --   --  1.5*  --   --     CBC: Recent Labs  Lab 04/10/24 0543 04/11/24 0417 04/12/24 0435 04/13/24 0438 04/14/24 0420  WBC 20.9* 18.5* 13.9* 13.7* 17.0*  HGB 14.5 13.1 13.5 13.7 12.9*  HCT 43.8 39.6 42.0 41.2 39.6  MCV 88.7 90.8 91.5 89.8 88.8  PLT 273 280 311 296 295     Scheduled Meds:  amitriptyline   50 mg Oral QHS   cyanocobalamin   1,000 mcg Oral Daily   enoxaparin  (LOVENOX ) injection  40 mg Subcutaneous Q24H   fluticasone  furoate-vilanterol  1 puff Inhalation Daily   lipase/protease/amylase  72,000 Units Oral TID WC   And   lipase/protease/amylase  36,000 Units Oral With snacks   multivitamin with minerals  1 tablet Oral Daily   pantoprazole   40 mg Oral BID AC   predniSONE   40 mg Oral Q breakfast   pregabalin   150 mg Oral TID   senna-docusate  2 tablet Oral QHS   umeclidinium bromide   1 puff Inhalation Daily   Continuous Infusions:  cefTRIAXone  (ROCEPHIN )  IV 2  g (04/14/24 1200)   metronidazole  500 mg (04/14/24 1000)     Assessment & Plan:   Acute on chronic pancreatitis with history of pseudocyst Abdominal pain Constipation Lipase modestly elevated to 182 today Abdominal exam is much improved with less gas, less distended, minimally tender I suspect constipation is contributing to his pain, discussed with him We will try enemas to decompress and follow clinically Continue conservative management for acute on chronic pancreatitis Patient tolerating clear liquids, will advance to full liquids today Continue ceftriaxone  and metronidazole  Shuttleworth #5 given pseudocyst and leukocytosis  Hypokalemia Will replete and recheck  Hyponatremia Likely secondary to intravascular volume depletion   Patient with decreased p.o. intake given restricted diet Will hydrate which will also help with pancreatitis   COVID infection Due to exacerbation of COPD Doing well on oral prednisone  and inhaled bronchodilators and flutter valve   Copied and pasted from previous note: Intermittent blurry vision - MRI brain is negative.  Recommend outpatient follow-up with ophthalmology   Suspected lung cancer Right upper lobe lung nodule - Per patient he had a PET scan however last 2 biopsies were nondiagnostic.  He will need to follow-up outpatient.  Has seen radiation oncology recently who recommends repeating another CT scan in 6 months.   GERD/gastritis - PPI   BPH - Flomax     DVT prophylaxis: Lovenox  Code Status: Full Family Communication: None today     Studies: No results found.   Principal Problem:   Pancreatic pseudocyst     [employer], [facility]  If 7PM-7AM, please contact night-coverage [email or web address]   LOS: 3 days

## 2024-04-14 NOTE — Plan of Care (Signed)
  Problem: Coping: Goal: Psychosocial and spiritual needs will be supported Outcome: Progressing   Problem: Respiratory: Goal: Will maintain a patent airway Outcome: Progressing   

## 2024-04-15 ENCOUNTER — Inpatient Hospital Stay (HOSPITAL_COMMUNITY)

## 2024-04-15 DIAGNOSIS — K863 Pseudocyst of pancreas: Secondary | ICD-10-CM | POA: Diagnosis not present

## 2024-04-15 LAB — COMPREHENSIVE METABOLIC PANEL WITH GFR
ALT: 14 U/L (ref 0–44)
AST: 21 U/L (ref 15–41)
Albumin: 2.8 g/dL — ABNORMAL LOW (ref 3.5–5.0)
Alkaline Phosphatase: 99 U/L (ref 38–126)
Anion gap: 8 (ref 5–15)
BUN: 13 mg/dL (ref 6–20)
CO2: 27 mmol/L (ref 22–32)
Calcium: 8.3 mg/dL — ABNORMAL LOW (ref 8.9–10.3)
Chloride: 95 mmol/L — ABNORMAL LOW (ref 98–111)
Creatinine, Ser: 1.11 mg/dL (ref 0.61–1.24)
GFR, Estimated: >60 mL/min (ref >60)
Glucose, Bld: 108 mg/dL — ABNORMAL HIGH (ref 70–99)
Potassium: 3.8 mmol/L (ref 3.5–5.1)
Sodium: 130 mmol/L — ABNORMAL LOW (ref 135–145)
Total Bilirubin: 0.8 mg/dL (ref 0.0–1.2)
Total Protein: 7.4 g/dL (ref 6.5–8.1)

## 2024-04-15 LAB — CBC
HCT: 41.6 % (ref 39.0–52.0)
Hemoglobin: 13.8 g/dL (ref 13.0–17.0)
MCH: 29.6 pg (ref 26.0–34.0)
MCHC: 33.2 g/dL (ref 30.0–36.0)
MCV: 89.1 fL (ref 80.0–100.0)
Platelets: 327 10*3/uL (ref 150–400)
RBC: 4.67 MIL/uL (ref 4.22–5.81)
RDW: 16.8 % — ABNORMAL HIGH (ref 11.5–15.5)
WBC: 17.4 10*3/uL — ABNORMAL HIGH (ref 4.0–10.5)
nRBC: 0 % (ref 0.0–0.2)

## 2024-04-15 LAB — CULTURE, BLOOD (ROUTINE X 2)
Culture: NO GROWTH
Culture: NO GROWTH
Special Requests: ADEQUATE
Special Requests: ADEQUATE

## 2024-04-15 LAB — LIPASE, BLOOD: Lipase: 183 U/L — ABNORMAL HIGH (ref 11–51)

## 2024-04-15 LAB — MAGNESIUM: Magnesium: 2.3 mg/dL (ref 1.7–2.4)

## 2024-04-15 MED ORDER — IOHEXOL 9 MG/ML PO SOLN
500.0000 mL | ORAL | Status: AC
  Administered 2024-04-15: 500 mL via ORAL

## 2024-04-15 MED ORDER — PROCHLORPERAZINE EDISYLATE 10 MG/2ML IJ SOLN
10.0000 mg | Freq: Four times a day (QID) | INTRAMUSCULAR | Status: DC | PRN
  Administered 2024-04-15 – 2024-05-03 (×5): 10 mg via INTRAVENOUS
  Filled 2024-04-15 (×5): qty 2

## 2024-04-15 MED ORDER — IOHEXOL 9 MG/ML PO SOLN
ORAL | Status: AC
  Filled 2024-04-15: qty 1000

## 2024-04-15 MED ORDER — SENNOSIDES-DOCUSATE SODIUM 8.6-50 MG PO TABS: 2.0000 | ORAL_TABLET | Freq: Every evening | ORAL | Status: DC | PRN

## 2024-04-15 MED ORDER — SODIUM CHLORIDE (PF) 0.9 % IJ SOLN
INTRAMUSCULAR | Status: AC
  Filled 2024-04-15: qty 50

## 2024-04-15 MED ORDER — LACTATED RINGERS IV SOLN: INTRAVENOUS | Status: AC

## 2024-04-15 MED ORDER — HYDROMORPHONE HCL 1 MG/ML IJ SOLN
1.0000 mg | INTRAMUSCULAR | Status: DC | PRN
  Administered 2024-04-15 – 2024-04-17 (×14): 1 mg via INTRAVENOUS
  Filled 2024-04-15 (×15): qty 1

## 2024-04-15 MED ORDER — IOHEXOL 300 MG/ML  SOLN
100.0000 mL | Freq: Once | INTRAMUSCULAR | Status: AC | PRN
  Administered 2024-04-15: 100 mL via INTRAVENOUS

## 2024-04-15 NOTE — Progress Notes (Addendum)
 PROGRESS NOTE    Wesley [name]  ZOX:[id number]  DOB: 1965-12-07  DOA: 04/10/2024 PCP: [name], DO Outpatient Specialists:   Hospital course:  58 year old with history of suspected lung cancer, GERD, COPD, chronic pancreatitis with pseudocyst comes to the hospital with complaints of cough and abdominal pain. Patient found to have COPD exacerbation in the setting of COVID infection along with acute on chronic pancreatitis complicated by pseudocyst. Slowly patient's diet was advanced as he was tolerating. Given bronchodilators.   Subjective:  Patient pains of worsened abdominal pain overnight, has some increased nausea.  Notes he is just generally quite uncomfortable, worse than yesterday   Objective: Vitals:   04/14/24 0516 04/14/24 1330 04/14/24 1931 04/15/24 0427  BP: (!) 138/93 130/87 128/78 136/84  Pulse: 85 77 87 84  Resp: 18 18 16 17   Temp: 99.2 F (37.3 C) 98 F (36.7 C) 97.8 F (36.6 C) 98.7 F (37.1 C)  TempSrc: Oral Oral Oral Oral  SpO2: 94% 95% 92% 91%  Weight:      Height:        Intake/Output Summary (Last 24 hours) at 04/15/2024 0943 Last data filed at 04/14/2024 1800 Gross per 24 hour  Intake 1054.91 ml  Output 1 ml  Net 1053.91 ml   Filed Weights   04/10/24 0926 04/10/24 1415  Weight: 79.8 kg 79.4 kg     Exam:  General: Patient lying flat on back paring uncomfortable but moving in bed without limitation Eyes: sclera anicteric, conjuctiva mild injection bilaterally CVS: S1-S2, regular  Respiratory: Small lung fields, CTA GI: Abdomen remains distended, it is firmer than it was yesterday.  He has moderate diffuse tenderness worsened in epigastric area.  He has some mild voluntary guarding.  No clear rebound. LE: Warm and well-perfused Neuro: A/O x 3,  grossly nonfocal.  Psych: patient is logical and coherent, judgement and insight appear normal, mood and affect appropriate to situation.  Data Reviewed:  Basic Metabolic Panel: Recent  Labs  Lab 04/10/24 0543 04/11/24 0417 04/12/24 0435 04/13/24 0438 04/14/24 0420 04/15/24 0403  NA 135 132* 136 137 133* 130*  K 3.5 3.8 4.0 3.4* 3.3* 3.8  CL 98 98 103 103 99 95*  CO2 22 25 22 23 24 27   GLUCOSE 139* 146* 100* 108* 105* 108*  BUN 10 16 17 16 15 13   CREATININE 0.97 1.02 0.76 1.02 0.92 1.11  CALCIUM 9.0 8.6* 8.6* 8.3* 8.1* 8.3*  MG 2.2  --  2.5* 2.1 2.2 2.3  PHOS  --   --  1.5*  --   --   --     CBC: Recent Labs  Lab 04/11/24 0417 04/12/24 0435 04/13/24 0438 04/14/24 0420 04/15/24 0403  WBC 18.5* 13.9* 13.7* 17.0* 17.4*  HGB 13.1 13.5 13.7 12.9* 13.8  HCT 39.6 42.0 41.2 39.6 41.6  MCV 90.8 91.5 89.8 88.8 89.1  PLT 280 311 296 295 327     Scheduled Meds:  amitriptyline   50 mg Oral QHS   cyanocobalamin   1,000 mcg Oral Daily   enoxaparin  (LOVENOX ) injection  40 mg Subcutaneous Q24H   fluticasone  furoate-vilanterol  1 puff Inhalation Daily   lipase/protease/amylase  72,000 Units Oral TID WC   And   lipase/protease/amylase  36,000 Units Oral With snacks   multivitamin with minerals  1 tablet Oral Daily   pantoprazole   40 mg Oral BID AC   predniSONE   40 mg Oral Q breakfast   pregabalin   150 mg Oral TID   senna-docusate  2 tablet Oral QHS   umeclidinium bromide   1 puff Inhalation Daily   Continuous Infusions:  cefTRIAXone  (ROCEPHIN )  IV 2 g (04/14/24 1200)   metronidazole  500 mg (04/15/24 0930)     Assessment & Plan:   Acute on chronic pancreatitis with history of pseudocyst Worsened abdominal pain overnight Patient with increased pain today, abdomen firm but not hard but with increased tenderness. WBC i remains elevated at 17, lipase stable overnight at 183. CT abd and pelvis ordered and pending. Will keep patient NPO Continue ceftriaxone  and metronidazole  Calandra #6 given pseudocyst and leukocytosis  COVID infection Due to exacerbation of COPD RUL nodule--suspected lung cancer Patient with apparent need for oxygen per RN, patient however  appears quite comfortable to my exam. Patient denies shortness of breath.  Patient is afebrile. Will check chest x-ray which is ordered and pending Continue oral prednisone  and inhaled bronchodilators and flutter valve  Hypokalemia Resolved with repletion  Hyponatremia Likely secondary to intravascular volume depletion  Patient with decreased p.o. intake given restricted diet Increase LR to 150 cc an hour   Copied and pasted from previous note: Intermittent blurry vision - MRI brain is negative.  Recommend outpatient follow-up with ophthalmology   Suspected lung cancer Right upper lobe lung nodule - Per patient he had a PET scan however last 2 biopsies were nondiagnostic.  He will need to follow-up outpatient.  Has seen radiation oncology recently who recommends repeating another CT scan in 6 months.   GERD/gastritis - PPI   BPH - Flomax     DVT prophylaxis: Lovenox  Code Status: Full Family Communication: None today     Studies: No results found.   Principal Problem:   Pancreatic pseudocyst     Magdalene [employer], [facility]  If 7PM-7AM, please contact night-coverage [email or web address]   LOS: 4 days

## 2024-04-15 NOTE — Plan of Care (Signed)

## 2024-04-16 ENCOUNTER — Inpatient Hospital Stay (HOSPITAL_COMMUNITY)

## 2024-04-16 DIAGNOSIS — K863 Pseudocyst of pancreas: Secondary | ICD-10-CM | POA: Diagnosis not present

## 2024-04-16 LAB — COMPREHENSIVE METABOLIC PANEL WITH GFR
ALT: 13 U/L (ref 0–44)
AST: 21 U/L (ref 15–41)
Albumin: 2.5 g/dL — ABNORMAL LOW (ref 3.5–5.0)
Alkaline Phosphatase: 81 U/L (ref 38–126)
Anion gap: 12 (ref 5–15)
BUN: 11 mg/dL (ref 6–20)
CO2: 27 mmol/L (ref 22–32)
Calcium: 8.2 mg/dL — ABNORMAL LOW (ref 8.9–10.3)
Chloride: 93 mmol/L — ABNORMAL LOW (ref 98–111)
Creatinine, Ser: 1.11 mg/dL (ref 0.61–1.24)
GFR, Estimated: >60 mL/min (ref >60)
Glucose, Bld: 109 mg/dL — ABNORMAL HIGH (ref 70–99)
Potassium: 3.3 mmol/L — ABNORMAL LOW (ref 3.5–5.1)
Sodium: 132 mmol/L — ABNORMAL LOW (ref 135–145)
Total Bilirubin: 0.8 mg/dL (ref 0.0–1.2)
Total Protein: 6.5 g/dL (ref 6.5–8.1)

## 2024-04-16 LAB — CBC
HCT: 41.4 % (ref 39.0–52.0)
Hemoglobin: 13.3 g/dL (ref 13.0–17.0)
MCH: 29 pg (ref 26.0–34.0)
MCHC: 32.1 g/dL (ref 30.0–36.0)
MCV: 90.2 fL (ref 80.0–100.0)
Platelets: 350 10*3/uL (ref 150–400)
RBC: 4.59 MIL/uL (ref 4.22–5.81)
RDW: 16.6 % — ABNORMAL HIGH (ref 11.5–15.5)
WBC: 14.2 10*3/uL — ABNORMAL HIGH (ref 4.0–10.5)
nRBC: 0 % (ref 0.0–0.2)

## 2024-04-16 LAB — LIPASE, BLOOD: Lipase: 115 U/L — ABNORMAL HIGH (ref 11–51)

## 2024-04-16 LAB — MAGNESIUM: Magnesium: 2.1 mg/dL (ref 1.7–2.4)

## 2024-04-16 MED ORDER — SODIUM CHLORIDE 0.9 % IV BOLUS: 500.0000 mL | Freq: Once | INTRAVENOUS | Status: DC

## 2024-04-16 MED ORDER — KCL IN DEXTROSE-NACL 20-5-0.9 MEQ/L-%-% IV SOLN
INTRAVENOUS | Status: AC
  Filled 2024-04-16 (×3): qty 1000

## 2024-04-16 MED ORDER — POTASSIUM CHLORIDE 10 MEQ/100ML IV SOLN
10.0000 meq | INTRAVENOUS | Status: AC
  Administered 2024-04-16 (×4): 10 meq via INTRAVENOUS
  Filled 2024-04-16 (×4): qty 100

## 2024-04-16 NOTE — Plan of Care (Signed)
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
## 2024-04-17 ENCOUNTER — Inpatient Hospital Stay (HOSPITAL_COMMUNITY)

## 2024-04-17 DIAGNOSIS — K863 Pseudocyst of pancreas: Secondary | ICD-10-CM | POA: Diagnosis not present

## 2024-04-17 LAB — CBC
HCT: 41.1 % (ref 39.0–52.0)
Hemoglobin: 13.5 g/dL (ref 13.0–17.0)
MCH: 29.2 pg (ref 26.0–34.0)
MCHC: 32.8 g/dL (ref 30.0–36.0)
MCV: 89 fL (ref 80.0–100.0)
Platelets: 365 10*3/uL (ref 150–400)
RBC: 4.62 MIL/uL (ref 4.22–5.81)
RDW: 16.6 % — ABNORMAL HIGH (ref 11.5–15.5)
WBC: 14.6 10*3/uL — ABNORMAL HIGH (ref 4.0–10.5)
nRBC: 0 % (ref 0.0–0.2)

## 2024-04-17 LAB — RENAL FUNCTION PANEL
Albumin: 2.6 g/dL — ABNORMAL LOW (ref 3.5–5.0)
Anion gap: 7 (ref 5–15)
BUN: 10 mg/dL (ref 6–20)
CO2: 26 mmol/L (ref 22–32)
Calcium: 8.2 mg/dL — ABNORMAL LOW (ref 8.9–10.3)
Chloride: 99 mmol/L (ref 98–111)
Creatinine, Ser: 0.88 mg/dL (ref 0.61–1.24)
GFR, Estimated: >60 mL/min (ref >60)
Glucose, Bld: 141 mg/dL — ABNORMAL HIGH (ref 70–99)
Phosphorus: 2 mg/dL — ABNORMAL LOW (ref 2.5–4.6)
Potassium: 4.2 mmol/L (ref 3.5–5.1)
Sodium: 132 mmol/L — ABNORMAL LOW (ref 135–145)

## 2024-04-17 LAB — MAGNESIUM: Magnesium: 2.1 mg/dL (ref 1.7–2.4)

## 2024-04-17 MED ORDER — DOCUSATE SODIUM 100 MG PO CAPS
100.0000 mg | ORAL_CAPSULE | Freq: Two times a day (BID) | ORAL | Status: DC
  Administered 2024-04-17 – 2024-04-24 (×11): 100 mg via ORAL
  Filled 2024-04-17 (×13): qty 1

## 2024-04-17 MED ORDER — HYDROMORPHONE HCL 1 MG/ML IJ SOLN
0.5000 mg | INTRAMUSCULAR | Status: DC | PRN
  Administered 2024-04-17 – 2024-04-18 (×5): 0.5 mg via INTRAVENOUS
  Filled 2024-04-17 (×5): qty 0.5

## 2024-04-17 MED ORDER — METHYLNALTREXONE BROMIDE 12 MG/0.6ML ~~LOC~~ SOLN: 12.0000 mg | Freq: Once | SUBCUTANEOUS | Status: DC

## 2024-04-17 MED ORDER — POLYETHYLENE GLYCOL 3350 17 G PO PACK
17.0000 g | PACK | Freq: Two times a day (BID) | ORAL | Status: DC
  Administered 2024-04-17 – 2024-04-18 (×3): 17 g via ORAL
  Filled 2024-04-17 (×3): qty 1

## 2024-04-17 NOTE — Plan of Care (Signed)
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
## 2024-04-18 ENCOUNTER — Inpatient Hospital Stay (HOSPITAL_COMMUNITY)

## 2024-04-18 DIAGNOSIS — K863 Pseudocyst of pancreas: Secondary | ICD-10-CM | POA: Diagnosis not present

## 2024-04-18 LAB — RENAL FUNCTION PANEL
Albumin: 2.5 g/dL — ABNORMAL LOW (ref 3.5–5.0)
Anion gap: 11 (ref 5–15)
BUN: 10 mg/dL (ref 6–20)
CO2: 24 mmol/L (ref 22–32)
Calcium: 8.3 mg/dL — ABNORMAL LOW (ref 8.9–10.3)
Chloride: 96 mmol/L — ABNORMAL LOW (ref 98–111)
Creatinine, Ser: 0.76 mg/dL (ref 0.61–1.24)
GFR, Estimated: >60 mL/min (ref >60)
Glucose, Bld: 104 mg/dL — ABNORMAL HIGH (ref 70–99)
Phosphorus: 2.7 mg/dL (ref 2.5–4.6)
Potassium: 3.9 mmol/L (ref 3.5–5.1)
Sodium: 131 mmol/L — ABNORMAL LOW (ref 135–145)

## 2024-04-18 LAB — CBC WITH DIFFERENTIAL/PLATELET
Abs Immature Granulocytes: 0.13 10*3/uL — ABNORMAL HIGH (ref 0.00–0.07)
Basophils Absolute: 0 10*3/uL (ref 0.0–0.1)
Basophils Relative: 0 %
Eosinophils Absolute: 0 10*3/uL (ref 0.0–0.5)
Eosinophils Relative: 0 %
HCT: 41.6 % (ref 39.0–52.0)
Hemoglobin: 13.5 g/dL (ref 13.0–17.0)
Immature Granulocytes: 1 %
Lymphocytes Relative: 13 %
Lymphs Abs: 2.2 10*3/uL (ref 0.7–4.0)
MCH: 29.4 pg (ref 26.0–34.0)
MCHC: 32.5 g/dL (ref 30.0–36.0)
MCV: 90.6 fL (ref 80.0–100.0)
Monocytes Absolute: 1.6 10*3/uL — ABNORMAL HIGH (ref 0.1–1.0)
Monocytes Relative: 9 %
Neutro Abs: 13.4 10*3/uL — ABNORMAL HIGH (ref 1.7–7.7)
Neutrophils Relative %: 77 %
Platelets: 370 10*3/uL (ref 150–400)
RBC: 4.59 MIL/uL (ref 4.22–5.81)
RDW: 16.6 % — ABNORMAL HIGH (ref 11.5–15.5)
WBC: 17.4 10*3/uL — ABNORMAL HIGH (ref 4.0–10.5)
nRBC: 0 % (ref 0.0–0.2)

## 2024-04-18 LAB — LIPASE, BLOOD: Lipase: 92 U/L — ABNORMAL HIGH (ref 11–51)

## 2024-04-18 LAB — MAGNESIUM: Magnesium: 2.2 mg/dL (ref 1.7–2.4)

## 2024-04-18 MED ORDER — METHYLNALTREXONE BROMIDE 12 MG/0.6ML ~~LOC~~ SOLN
12.0000 mg | Freq: Once | SUBCUTANEOUS | Status: AC
  Administered 2024-04-18: 12 mg via SUBCUTANEOUS
  Filled 2024-04-18: qty 0.6

## 2024-04-18 MED ORDER — IOHEXOL 9 MG/ML PO SOLN
500.0000 mL | ORAL | Status: AC
  Administered 2024-04-18 (×2): 500 mL via ORAL

## 2024-04-18 MED ORDER — IOHEXOL 300 MG/ML  SOLN
100.0000 mL | Freq: Once | INTRAMUSCULAR | Status: AC | PRN
  Administered 2024-04-18: 100 mL via INTRAVENOUS

## 2024-04-18 MED ORDER — OXYCODONE HCL 5 MG PO TABS
5.0000 mg | ORAL_TABLET | Freq: Four times a day (QID) | ORAL | Status: DC | PRN
  Administered 2024-04-18 – 2024-05-05 (×27): 5 mg via ORAL
  Filled 2024-04-18 (×29): qty 1

## 2024-04-18 NOTE — Progress Notes (Signed)
 [facility] Liaison Note  04/18/2024  Wesley [name] March 29, 1966 [id number]  Location: RN Hospital Liaison screened the patient remotely at [facility]  Insurance: [employer]   Wesley [name] is a 58 y.o. male who is a Primary Care Patient of [name], DO  [facility]. The patient was screened for readmission hospitalization with noted high risk score for unplanned readmission risk with 2 IP in 6 months.  The patient was assessed for potential Care Management service needs for post hospital transition for care coordination. Review of patient's electronic medical record reveals patient was admitted for Pancreatis pseudocyst. Pt will transition home with self care with no anticipated needs.  Plan: [facility] Liaison will continue to follow progress and disposition to asess for post hospital community care coordination/management needs.  Referral request for community care coordination: pending disposition.   [facility]BCI Care Management/Population Health does not replace or interfere with any arrangements made by the Inpatient Transition of Care team.   For questions contact:   Lilla Reichert, RN, BSN Hospital Liaison [place]   [facility], Population Health Office Hours MTWF  8:00 am-6:00 pm Direct Dial: [phone] mobile [email or web address]

## 2024-04-18 NOTE — Plan of Care (Signed)
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
## 2024-04-19 ENCOUNTER — Inpatient Hospital Stay (HOSPITAL_COMMUNITY)

## 2024-04-19 DIAGNOSIS — J45909 Unspecified asthma, uncomplicated: Secondary | ICD-10-CM

## 2024-04-19 DIAGNOSIS — K859 Acute pancreatitis without necrosis or infection, unspecified: Secondary | ICD-10-CM | POA: Diagnosis not present

## 2024-04-19 DIAGNOSIS — R1084 Generalized abdominal pain: Secondary | ICD-10-CM | POA: Diagnosis not present

## 2024-04-19 DIAGNOSIS — J441 Chronic obstructive pulmonary disease with (acute) exacerbation: Secondary | ICD-10-CM | POA: Diagnosis not present

## 2024-04-19 DIAGNOSIS — R140 Abdominal distension (gaseous): Secondary | ICD-10-CM

## 2024-04-19 DIAGNOSIS — R1032 Left lower quadrant pain: Secondary | ICD-10-CM

## 2024-04-19 DIAGNOSIS — K863 Pseudocyst of pancreas: Secondary | ICD-10-CM | POA: Diagnosis not present

## 2024-04-19 DIAGNOSIS — K861 Other chronic pancreatitis: Secondary | ICD-10-CM

## 2024-04-19 LAB — COMPREHENSIVE METABOLIC PANEL WITH GFR
ALT: 16 U/L (ref 0–44)
AST: 21 U/L (ref 15–41)
Albumin: 2.9 g/dL — ABNORMAL LOW (ref 3.5–5.0)
Alkaline Phosphatase: 79 U/L (ref 38–126)
Anion gap: 14 (ref 5–15)
BUN: 10 mg/dL (ref 6–20)
CO2: 22 mmol/L (ref 22–32)
Calcium: 8.6 mg/dL — ABNORMAL LOW (ref 8.9–10.3)
Chloride: 94 mmol/L — ABNORMAL LOW (ref 98–111)
Creatinine, Ser: 0.83 mg/dL (ref 0.61–1.24)
GFR, Estimated: >60 mL/min (ref >60)
Glucose, Bld: 127 mg/dL — ABNORMAL HIGH (ref 70–99)
Potassium: 3.6 mmol/L (ref 3.5–5.1)
Sodium: 130 mmol/L — ABNORMAL LOW (ref 135–145)
Total Bilirubin: 0.9 mg/dL (ref 0.0–1.2)
Total Protein: 7.8 g/dL (ref 6.5–8.1)

## 2024-04-19 LAB — CBC
HCT: 43.5 % (ref 39.0–52.0)
Hemoglobin: 14.5 g/dL (ref 13.0–17.0)
MCH: 29.1 pg (ref 26.0–34.0)
MCHC: 33.3 g/dL (ref 30.0–36.0)
MCV: 87.2 fL (ref 80.0–100.0)
Platelets: 506 10*3/uL — ABNORMAL HIGH (ref 150–400)
RBC: 4.99 MIL/uL (ref 4.22–5.81)
RDW: 16.6 % — ABNORMAL HIGH (ref 11.5–15.5)
WBC: 23.5 10*3/uL — ABNORMAL HIGH (ref 4.0–10.5)
nRBC: 0 % (ref 0.0–0.2)

## 2024-04-19 MED ORDER — PIPERACILLIN-TAZOBACTAM 3.375 G IVPB
3.3750 g | Freq: Three times a day (TID) | INTRAVENOUS | Status: DC
  Administered 2024-04-19 – 2024-04-27 (×24): 3.375 g via INTRAVENOUS
  Filled 2024-04-19 (×25): qty 50

## 2024-04-19 MED ORDER — SODIUM CHLORIDE (PF) 0.9 % IJ SOLN
INTRAMUSCULAR | Status: AC
  Filled 2024-04-19: qty 50

## 2024-04-19 MED ORDER — HYDROMORPHONE HCL 1 MG/ML IJ SOLN
0.5000 mg | Freq: Once | INTRAMUSCULAR | Status: AC
  Administered 2024-04-19: 0.5 mg via INTRAVENOUS
  Filled 2024-04-19: qty 0.5

## 2024-04-19 MED ORDER — LACTATED RINGERS IV SOLN: INTRAVENOUS | Status: AC

## 2024-04-19 MED ORDER — SMOG ENEMA
960.0000 mL | Freq: Once | RECTAL | Status: AC
  Administered 2024-04-19: 960 mL via RECTAL
  Filled 2024-04-19: qty 960

## 2024-04-19 MED ORDER — PANTOPRAZOLE SODIUM 40 MG IV SOLR
40.0000 mg | Freq: Two times a day (BID) | INTRAVENOUS | Status: DC
  Administered 2024-04-19 – 2024-04-22 (×7): 40 mg via INTRAVENOUS
  Filled 2024-04-19 (×8): qty 10

## 2024-04-19 MED ORDER — HYDROMORPHONE HCL 1 MG/ML IJ SOLN
1.0000 mg | INTRAMUSCULAR | Status: DC | PRN
  Administered 2024-04-19 – 2024-04-20 (×4): 1 mg via INTRAVENOUS
  Filled 2024-04-19 (×4): qty 1

## 2024-04-19 MED ORDER — IOHEXOL 350 MG/ML SOLN
80.0000 mL | Freq: Once | INTRAVENOUS | Status: AC | PRN
  Administered 2024-04-19: 80 mL via INTRAVENOUS
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
## 2024-04-19 NOTE — Progress Notes (Deleted)
  IR BRIEF NOTE:  IR was requested for possible drainage of peri-pancreatic fluid collections concerning for abscess vs. Pseudoaneurysm, as seen on CT A/P w/ CM yesterday. VIR attending, Dr. 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 The extent/size of the pseudoaneurysm isn't clearly defined on CT venous phase study. Dr. Jinx Mourning recommended follow-up CTA abdomen pelvis for to determine how much is hematoma vs. possible infection. CTA A/P was performed. All fluid collections are noted thrombosed on CTA imaging, and no indication for further intervention or drainage are needed. The only true fluid collection is just adjacent to the descending colon and noted to be very small. The remaining fluid collections are within the capsule of the spleen itself, with risks outweighing benefits from drain placement, as significant bleeding is likely. Care team is aware.    Electronically Signed: Lovena Rubinstein, PA-C 04/19/2024, 1:25 PM

## 2024-04-19 NOTE — Progress Notes (Signed)
 Called report to 4th floor- Lauren,RN. All belongings packed and sent with pt.

## 2024-04-19 NOTE — Plan of Care (Signed)
  Problem: Elimination: Goal: Will not experience complications related to bowel motility Outcome: Not Progressing   Problem: Pain Managment: Goal: General experience of comfort will improve and/or be controlled Outcome: Not Progressing   Problem: Education: Goal: Knowledge of risk factors and measures for prevention of condition will improve Outcome: Progressing   Problem: Elimination: Goal: Will not experience complications related to urinary retention Outcome: Progressing   Problem: Safety: Goal: Ability to remain free from injury will improve Outcome: Progressing   Problem: Skin Integrity: Goal: Risk for impaired skin integrity will decrease Outcome: Progressing
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
## 2024-04-20 DIAGNOSIS — J441 Chronic obstructive pulmonary disease with (acute) exacerbation: Secondary | ICD-10-CM | POA: Diagnosis not present

## 2024-04-20 DIAGNOSIS — K5903 Drug induced constipation: Secondary | ICD-10-CM

## 2024-04-20 DIAGNOSIS — K859 Acute pancreatitis without necrosis or infection, unspecified: Secondary | ICD-10-CM | POA: Diagnosis not present

## 2024-04-20 DIAGNOSIS — R140 Abdominal distension (gaseous): Secondary | ICD-10-CM | POA: Diagnosis not present

## 2024-04-20 DIAGNOSIS — K863 Pseudocyst of pancreas: Secondary | ICD-10-CM | POA: Diagnosis not present

## 2024-04-20 DIAGNOSIS — R1084 Generalized abdominal pain: Secondary | ICD-10-CM | POA: Diagnosis not present

## 2024-04-20 DIAGNOSIS — R1032 Left lower quadrant pain: Secondary | ICD-10-CM | POA: Diagnosis not present

## 2024-04-20 LAB — RENAL FUNCTION PANEL
Albumin: 2.7 g/dL — ABNORMAL LOW (ref 3.5–5.0)
Anion gap: 11 (ref 5–15)
BUN: 13 mg/dL (ref 6–20)
CO2: 24 mmol/L (ref 22–32)
Calcium: 8.4 mg/dL — ABNORMAL LOW (ref 8.9–10.3)
Chloride: 96 mmol/L — ABNORMAL LOW (ref 98–111)
Creatinine, Ser: 0.92 mg/dL (ref 0.61–1.24)
GFR, Estimated: >60 mL/min (ref >60)
Glucose, Bld: 133 mg/dL — ABNORMAL HIGH (ref 70–99)
Phosphorus: 2.9 mg/dL (ref 2.5–4.6)
Potassium: 3.6 mmol/L (ref 3.5–5.1)
Sodium: 131 mmol/L — ABNORMAL LOW (ref 135–145)

## 2024-04-20 LAB — CBC
HCT: 40 % (ref 39.0–52.0)
Hemoglobin: 13.5 g/dL (ref 13.0–17.0)
MCH: 29 pg (ref 26.0–34.0)
MCHC: 33.8 g/dL (ref 30.0–36.0)
MCV: 86 fL (ref 80.0–100.0)
Platelets: 577 10*3/uL — ABNORMAL HIGH (ref 150–400)
RBC: 4.65 MIL/uL (ref 4.22–5.81)
RDW: 16.7 % — ABNORMAL HIGH (ref 11.5–15.5)
WBC: 26.5 10*3/uL — ABNORMAL HIGH (ref 4.0–10.5)
nRBC: 0 % (ref 0.0–0.2)

## 2024-04-20 MED ORDER — SMOG ENEMA
960.0000 mL | Freq: Once | RECTAL | Status: AC
  Administered 2024-04-20: 960 mL via RECTAL
  Filled 2024-04-20: qty 960

## 2024-04-20 MED ORDER — SODIUM CHLORIDE 0.9 % IV SOLN: INTRAVENOUS | Status: AC

## 2024-04-20 MED ORDER — HYDROMORPHONE HCL 1 MG/ML IJ SOLN
1.0000 mg | INTRAMUSCULAR | Status: DC | PRN
  Administered 2024-04-20 – 2024-05-04 (×75): 1 mg via INTRAVENOUS
  Filled 2024-04-20 (×75): qty 1

## 2024-04-20 NOTE — Progress Notes (Signed)
 [facility]                                                                              Wesley [name], is a 58 y.o. male, DOB - 10-15-1966, AOZ:[id number] Admit date - 04/10/2024    Outpatient Primary MD for the patient is Wesley Dallas, DO  LOS - 9  days  Chief Complaint  Patient presents with   Shortness of Breath       Brief summary   Patient is a 59 year old male with history of suspected lung CA, GERD, COPD, chronic pancreatitis with pseudocyst presented to the hospital with cough, abdominal pain.  He was found to have COPD exacerbation in the setting of COVID infection along with acute on chronic pancreatitis complicated by pseudocyst. He was placed on IV fluids, pain control, bronchodilators, diet was advanced On 5/17, patient noted to have abdominal distention with likely ileus, no SBO or obstruction.   Assessment & Plan   Abdominal pain with distention, Ileus Intra-abdominal fluid collections vs abscess Acute on chronic pancreatitis - Initially thought as obstipation and ileus, patient received stool softeners, placed on NGT to low intermittent wall suction - Worsening leukocytosis, pain, CT abdomen on 5/19 showed perisplenic fluid collection, another fluid collection near the diaphragm, wall thickening and inflammation of the splenic flexure of the colon,  largest collection adjacent to the colon, 2.3x 4.4x 5.9 cm - Placed on n.p.o. status, IV fluids, continue NGT, pain control.  GI, surgery consulted. -Placed on IV Zosyn  - CTA abdomen pelvis negative for pseudoaneurysm, per IR, fluid collections are noted to thrombosed on CTA imaging and no indication for drainage.  Only true fluid collection is just adjacent to descending colon and noted to be very small.   - Symptomatically improving today, had a large BM yesterday after smog enema and 1 this morning at 2 AM - GI following, continue bowel regimen - Leukocytosis trending up (was on steroids, DC'd on  5/20), blood cultures NTD.   COVID infection - COVID was positive on 04/10/2024, currently no hypoxia or shortness of breath - Continue symptomatic treatment, bronchodilators.  Prednisone  discontinued on 5/20, no hypoxia  COPD exacerbation, - Currently stable, no acute wheezing, continue bronchodilators  - DC'd prednisone   RUL nodule, suspected lung Ca - Patient had a PET scan, last 2 biopsies were nondiagnostic, will need to follow-up outpatient.  Has seen radiation oncology recently and was recommended repeating another CT scan in 6 months  GERD/gastritis - Changed to IV PPI  Intermittent blurry vision - MRI brain is negative, recommended outpatient follow-up with ophthalmology  Hyponatremia - Improving, continue IV hydration until tolerating p.o. diet  Obstipation/constipation -On stool softeners, received Relistor  on 5/19    Estimated body mass index is 26.61 kg/m as calculated from the following:   Height as of this encounter: 5\' 8"  (1.727 m).   Weight as of this encounter: 79.4 kg.  Code Status: Full code DVT Prophylaxis:  enoxaparin  (LOVENOX ) injection 40 mg Start: 04/10/24 1000   Level of Care: Level of care: Progressive Family Communication: Updated patient Disposition Plan:      Remains inpatient appropriate:  Procedures:    Consultants:   General Surgery GI Intervention radiology  Antimicrobials:   Anti-infectives (From admission, onward)    Start     Dose/Rate Route Frequency Ordered Stop   04/19/24 1000  piperacillin -tazobactam (ZOSYN ) IVPB 3.375 g        3.375 g 12.5 mL/hr over 240 Minutes Intravenous Every 8 hours 04/19/24 0854     04/12/24 1000  cefTRIAXone  (ROCEPHIN ) 2 g in sodium chloride  0.9 % 100 mL IVPB  Status:  Discontinued        2 g 200 mL/hr over 30 Minutes Intravenous Daily 04/12/24 0758 04/19/24 0849   04/10/24 2100  metroNIDAZOLE  (FLAGYL ) IVPB 500 mg  Status:  Discontinued        500 mg 100 mL/hr over 60 Minutes Intravenous  Every 12 hours 04/10/24 0840 04/19/24 0849   04/10/24 2100  vancomycin  (VANCOCIN ) IVPB 1000 mg/200 mL premix  Status:  Discontinued        1,000 mg 200 mL/hr over 60 Minutes Intravenous Every 12 hours 04/10/24 0940 04/12/24 0758   04/10/24 1600  ceFEPIme  (MAXIPIME ) 2 g in sodium chloride  0.9 % 100 mL IVPB  Status:  Discontinued        2 g 200 mL/hr over 30 Minutes Intravenous Every 8 hours 04/10/24 0940 04/12/24 0758   04/10/24 0830  metroNIDAZOLE  (FLAGYL ) IVPB 500 mg        500 mg 100 mL/hr over 60 Minutes Intravenous  Once 04/10/24 0819 04/10/24 1222   04/10/24 0800  vancomycin  (VANCOREADY) IVPB 1750 mg/350 mL        1,750 mg 175 mL/hr over 120 Minutes Intravenous  Once 04/10/24 0722 04/10/24 1120   04/10/24 0730  ceFEPIme  (MAXIPIME ) 2 g in sodium chloride  0.9 % 100 mL IVPB        2 g 200 mL/hr over 30 Minutes Intravenous  Once 04/10/24 0722 04/10/24 1430          Medications  amitriptyline   50 mg Oral QHS   cyanocobalamin   1,000 mcg Oral Daily   docusate sodium   100 mg Oral BID   enoxaparin  (LOVENOX ) injection  40 mg Subcutaneous Q24H   fluticasone  furoate-vilanterol  1 puff Inhalation Daily   lipase/protease/amylase  72,000 Units Oral TID WC   And   lipase/protease/amylase  36,000 Units Oral With snacks   multivitamin with minerals  1 tablet Oral Daily   pantoprazole  (PROTONIX ) IV  40 mg Intravenous Q12H   pregabalin   150 mg Oral TID   umeclidinium bromide   1 puff Inhalation Daily      Subjective:   Wesley [name] was seen and examined today.  Feeling somewhat better today, abdomen less distended, states had a BM yesterday and another BM at 2 AM.   NGT+, no fever or chills.  Objective:   Vitals:   04/19/24 1717 04/19/24 2200 04/20/24 0516 04/20/24 0859  BP: (!) 140/94 (!) 149/89 (!) 146/88   Pulse: 88 92 (!) 109   Resp: 17 18 20    Temp: 98.4 F (36.9 C) 97.9 F (36.6 C) 98.6 F (37 C)   TempSrc: Oral Oral Oral   SpO2: 95% 96% 91% (S) 91%  Weight:      Height:         Intake/Output Summary (Last 24 hours) at 04/20/2024 1159 Last data filed at 04/20/2024 0931 Gross per 24 hour  Intake 2981.6 ml  Output 1250 ml  Net 1731.6 ml     Wt Readings from Last 3 Encounters:  04/10/24  79.4 kg  03/10/24 79.9 kg  12/29/23 75.8 kg   Physical Exam General: Alert and oriented x 3, NAD, NGT + Cardiovascular: S1 S2 clear, RRR.  Respiratory: CTAB, no wheezing, rales or rhonchi Gastrointestinal: Soft, still somewhat distended, mild diffuse TTP, NGT with bilious drainage  Ext: no pedal edema bilaterally Neuro: no new deficits Psych: Normal affect     Data Reviewed:  I have personally reviewed following labs    CBC Lab Results  Component Value Date   WBC 26.5 (H) 04/20/2024   RBC 4.65 04/20/2024   HGB 13.5 04/20/2024   HCT 40.0 04/20/2024   MCV 86.0 04/20/2024   MCH 29.0 04/20/2024   PLT 577 (H) 04/20/2024   MCHC 33.8 04/20/2024   RDW 16.7 (H) 04/20/2024   LYMPHSABS 2.2 04/18/2024   MONOABS 1.6 (H) 04/18/2024   EOSABS 0.0 04/18/2024   BASOSABS 0.0 04/18/2024     Last metabolic panel Lab Results  Component Value Date   NA 131 (L) 04/20/2024   K 3.6 04/20/2024   CL 96 (L) 04/20/2024   CO2 24 04/20/2024   BUN 13 04/20/2024   CREATININE 0.92 04/20/2024   GLUCOSE 133 (H) 04/20/2024   GFRNONAA >60 04/20/2024   GFRAA 106 10/08/2020   CALCIUM 8.4 (L) 04/20/2024   PHOS 2.9 04/20/2024   PROT 7.8 04/19/2024   ALBUMIN 2.7 (L) 04/20/2024   LABGLOB 3.9 09/17/2022   AGRATIO 0.9 (L) 09/17/2022   BILITOT 0.9 04/19/2024   ALKPHOS 79 04/19/2024   AST 21 04/19/2024   ALT 16 04/19/2024   ANIONGAP 11 04/20/2024    CBG (last 3)  No results for input(s): "GLUCAP" in the last 72 hours.    Coagulation Profile: No results for input(s): "INR", "PROTIME" in the last 168 hours.   Radiology Studies: I have personally reviewed the imaging studies  CT ANGIO ABDOMEN W &/OR WO CONTRAST Result Date: 04/19/2024 CLINICAL DATA:  Left upper quadrant  pain. Evaluate for a pseudoaneurysm at the splenic hilum. EXAM: CT ANGIOGRAPHY OF ABDOMEN WITH CONTRAST TECHNIQUE: Multidetector CT imaging of the abdomen was performed using the standard protocol during bolus administration of intravenous contrast. Multiplanar CT image reconstructions and MIPs were obtained to evaluate the vascular anatomy. RADIATION DOSE REDUCTION: This exam was performed according to the departmental dose-optimization program which includes automated exposure control, adjustment of the mA and/or kV according to patient size and/or use of iterative reconstruction technique. CONTRAST:  80mL OMNIPAQUE  IOHEXOL  350 MG/ML SOLN COMPARISON:  CT abdomen and pelvis dated 04/18/2024 and 04/15/2024 FINDINGS: VASCULAR Aorta: Normal caliber of the abdominal aorta without aneurysm or dissection. Atherosclerotic calcifications involving the abdominal aorta. Celiac: High-grade stenosis involving the proximal celiac trunk with hook like configuration. These findings are suggestive for median arcuate ligament compression. The main branches of the celiac trunk including the splenic artery, left gastric artery and common hepatic artery are patent. Splenic artery is patent but there is no evidence for a aneurysm or pseudoaneurysm at the pancreatic tail or splenic hilar region. In retrospect, an aneurysm formation was present on the exam from 04/15/2024 and 04/18/2024, most conspicuous on the exam from 04/15/2024. Suspect this could represent a thrombosed pseudoaneurysm. SMA: Patent without evidence of aneurysm, dissection, vasculitis or significant stenosis. Renals: Both renal arteries are patent without evidence of aneurysm, dissection, vasculitis, fibromuscular dysplasia or significant stenosis. IMA: Patent with possible stenosis at the origin. Visualized inflow structures: Visualized bilateral common iliac arteries are patent. Proximal right external and right internal iliac arteries are patent.  Veins: Limited  evaluation due to the arterial phase of contrast. Cannot evaluate the portal venous system on this examination. Review of the MIP images confirms the above findings. NON-VASCULAR Lower chest: Small left pleural effusion with compressive atelectasis in the left lower lobe. Probable atelectasis in the lingula. Hepatobiliary: Cholecystectomy. Study has limitations from motion artifact. Scattered hypodensities in the liver likely represent hepatic cysts. Extrahepatic bile duct measures roughly 9 mm. No significant intrahepatic biliary dilatation. Pancreas: Mild dilatation of the main pancreatic duct particularly near the pancreatic neck. Again noted are hypodensities or small cystic areas involving the pancreatic tail region and similar to the recent comparison examination. Fluid and mild inflammatory changes in the pancreatic tail, spleen and descending colon. Spleen: Again noted is heterogeneous enhancement in the spleen with irregular subcapsular fluid collections along the superior aspect and along the inferolateral aspect of the spleen. These findings are better characterized on the recent comparison exam from 04/18/2024 and there has been minimal change since the recent comparison examination. Findings could represent subcapsular pseudocyst formations. Adrenals/Urinary Tract: Adrenal glands are within normal limits. Normal appearance of both kidneys without hydronephrosis. No suspicious renal lesions. Stomach/Bowel: Nasogastric tube terminates in the distal stomach. Stomach is decompressed. Large amount of oral contrast in the ascending colon and cecum. Cecum remains dilated measuring up to 7.8 cm. Contrast has advanced into the descending colon. Again noted is fluid and inflammatory changes surrounding the descending colon. Small irregular pockets of fluid around the descending colon on image 52/4 are similar to the recent comparison examination. Lymphatic: Mildly prominent lymph nodes in the upper abdomen at the  gastrohepatic ligament. Other: Inflammatory changes in the left upper abdomen situated around the spleen, distal pancreas and descending colon. Findings are similar to the recent comparison examination. No evidence for free air within the abdomen. Musculoskeletal: No acute bone abnormality. Again noted is extensive endplate sclerosis at L5-S1. IMPRESSION: VASCULAR 1. No evidence for an aneurysm or pseudoaneurysm at the splenic hilum. This represents a change from the exam on 04/15/2024 and 04/18/2024. This may represent a thrombosed pseudoaneurysm. Consider short-term follow-up in 1-2 weeks with triple phase CT of the abdomen to confirm this finding. 2. 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 3.  Aortic Atherosclerosis (ICD10-I70.0). NON-VASCULAR 1. Persistent irregular fluid collections involving the spleen. These are most compatible subcapsular collections and could represent pseudocysts. In addition, the spleen is heterogeneous and this may represent intraparenchymal cysts or even infarcts. Findings are similar to the exam on 04/18/2024. 2. Persistent inflammatory changes and irregular fluid collections in left upper abdomen around the descending colon. Findings are suggestive for small irregular pseudocyst formations. Difficult to exclude wall thickening / colitis involving the proximal descending colon. Recommend attention to this area on follow up imaging. 3. Persistent low-density areas involving the pancreatic tail are similar to prior examinations. Findings may represent pseudocyst formations. 4. Small left pleural effusion with compressive atelectasis in left lower lobe. Electronically Signed   By: Elene Griffes M.D.   On: 04/19/2024 14:56   CT ABDOMEN PELVIS W CONTRAST Result Date: 04/18/2024 CLINICAL DATA:  Bowel obstruction suspected.  Abdominal pain. EXAM: CT ABDOMEN AND PELVIS WITH CONTRAST TECHNIQUE: Multidetector CT imaging of the  abdomen and pelvis was performed using the standard protocol following bolus administration of intravenous contrast. RADIATION DOSE REDUCTION: This exam was performed according to the departmental dose-optimization program which includes automated exposure control, adjustment of the mA and/or kV according to patient  size and/or use of iterative reconstruction technique. CONTRAST:  OMNIPAQUE  IOHEXOL  300 MG/ML  SOLN COMPARISON:  CT abdomen and pelvis 04/15/2024. FINDINGS: Lower chest: There are small bilateral pleural effusions with atelectasis in the bilateral lower lobes, left greater than right. This has not significantly changed. Hepatobiliary: Cholecystectomy clips are present. There is mild intra and extrahepatic biliary ductal dilatation which is similar to the prior study. There are rounded hypodensities in the liver which are too small to characterize and unchanged, likely cysts. Pancreas: Calcifications in the pancreatic head are unchanged. Heterogeneous hypodensity in the pancreatic tail with adjacent pancreatic tail enhancing fluid collection extending into the subcapsular splenic fluid collection has not significantly changed from prior. Lateral perisplenic fluid collection measures 4.9 x 2.4 cm and fluid collection near the diaphragm measures 6.1 x 1.8 cm. Spleen: The spleen is normal in size. Heterogeneous perisplenic fluid collection appears unchanged. Adrenals/Urinary Tract: Adrenal glands are unremarkable. Kidneys are normal, without renal calculi, focal lesion, or hydronephrosis. Bladder is unremarkable. Stomach/Bowel: There is wall thickening and inflammation of the splenic flexure of the colon as seen on the prior examination. There are enhancing fluid collections extending from the level of the pancreatic tail inferiorly to the hepatic flexure of the colon which have increased from prior. The largest collection adjacent to the colon measures 2.3 x 4.4 by 5.9 cm image 2/33. The colon  proximal to this level appears dilated with air-fluid levels. Colon distal to this level is decompressed. Appendix is normal. There is sigmoid colon diverticulosis. Small bowel appears within normal limits. The tip of an enteric tube is in the proximal body of the stomach. The stomach is decompressed. Vascular/Lymphatic: Aortic atherosclerosis. No enlarged abdominal or pelvic lymph nodes. Reproductive: Prostate gland within normal limits. Other: There is trace free fluid. There is no free air. There are small fat containing inguinal hernias. There is subcutaneous air in the lower right anterior abdominal wall which may be related to medication injection site. Musculoskeletal: There is severe degenerative changes at L5-S1. IMPRESSION: 1. Interval increase in enhancing fluid collections extending from the level of the pancreatic tail inferiorly to the hepatic flexure of the colon. Findings are worrisome for abscesses or pseudocyst. 2. Stable wall thickening and inflammation of the splenic flexure of the colon compatible with colitis. 3. The colon proximal to the splenic flexure is not dilated with air-fluid levels worrisome for partial colonic obstruction. 4. Stable enhancing fluid collections adjacent to the pancreatic tail and spleen compatible with pseudocysts. 5. Stable small bilateral pleural effusions with bibasilar atelectasis. 6. Stable mild intra and extrahepatic biliary ductal dilatation. 7. Aortic atherosclerosis. Aortic Atherosclerosis (ICD10-I70.0). Electronically Signed   By: Tyron Gallon M.D.   On: 04/18/2024 20:31   DG Abd Portable 1V Result Date: 04/18/2024 CLINICAL DATA:  Nasogastric tube placement. EXAM: PORTABLE ABDOMEN - 1 VIEW COMPARISON:  Radiograph yesterday FINDINGS: Tip and side port of the enteric tube below the diaphragm in the stomach. Mild gaseous bowel distention in the upper abdomen is similar to yesterday's exam. Multifocal lung scarring. IMPRESSION: Tip and side port of the  enteric tube below the diaphragm in the stomach. Electronically Signed   By: Chadwick Colonel M.D.   On: 04/18/2024 16:56       Garrison Michie M.D. [facility] 04/20/2024, 11:59 AM  Available via Epic secure chat 7am-7pm After 7 pm, please refer to night coverage provider listed on amion.

## 2024-04-20 NOTE — Progress Notes (Signed)
 Progress Note     Subjective: Pt reports 2 BM yesterday and 1 this AM, mostly loose but fairly large volume for at least 1 BM yesterday. Still feels nauseated and not passing flatus outside of having BMs. Abdomen overall feels better but still bloated. Reports upper abdominal pain that feels different compared to pain related to chronic pancreatitis.   Objective: Vital signs in last 24 hours: Temp:  [97.7 F (36.5 C)-98.6 F (37 C)] 98.6 F (37 C) (05/21 0516) Pulse Rate:  [88-109] 109 (05/21 0516) Resp:  [17-20] 20 (05/21 0516) BP: (140-150)/(88-95) 146/88 (05/21 0516) SpO2:  [91 %-97 %] 91 % (05/21 0516) Last BM Date : 04/19/24  Intake/Output from previous [place]: 05/20 0701 - 05/21 0700 In: 2448.6 [P.O.:240; I.V.:2098.8; IV Piggyback:109.7] Out: 1700 [Urine:1050; Emesis/NG output:650] Intake/Output this shift: No intake/output data recorded.  PE: General: pleasant, WD male who is laying in bed in NAD HEENT: sclera anicteric Heart: sinus tachycardia in the 100s Lungs: Respiratory effort nonlabored Abd: soft, moderately distended, mild generalized ttp without peritonitis, prior surgical scars noted, NGT with bilious drainage Psych: A&Ox3 with an appropriate affect.    Lab Results:  Recent Labs    04/19/24 0914 04/20/24 0532  WBC 23.5* 26.5*  HGB 14.5 13.5  HCT 43.5 40.0  PLT 506* 577*   BMET Recent Labs    04/19/24 0914 04/20/24 0532  NA 130* 131*  K 3.6 3.6  CL 94* 96*  CO2 22 24  GLUCOSE 127* 133*  BUN 10 13  CREATININE 0.83 0.92  CALCIUM 8.6* 8.4*   PT/INR No results for input(s): "LABPROT", "INR" in the last 72 hours. CMP     Component Value Date/Time   NA 131 (L) 04/20/2024 0532   NA 139 09/17/2022 1239   K 3.6 04/20/2024 0532   CL 96 (L) 04/20/2024 0532   CO2 24 04/20/2024 0532   GLUCOSE 133 (H) 04/20/2024 0532   BUN 13 04/20/2024 0532   BUN 6 09/17/2022 1239   CREATININE 0.92 04/20/2024 0532   CREATININE 0.94 07/26/2019 1210   CALCIUM  8.4 (L) 04/20/2024 0532   PROT 7.8 04/19/2024 0914   PROT 7.5 09/17/2022 1239   ALBUMIN 2.7 (L) 04/20/2024 0532   ALBUMIN 3.6 (L) 09/17/2022 1239   AST 21 04/19/2024 0914   ALT 16 04/19/2024 0914   ALT 24 05/02/2019 1617   ALKPHOS 79 04/19/2024 0914   BILITOT 0.9 04/19/2024 0914   BILITOT <0.2 09/17/2022 1239   GFRNONAA >60 04/20/2024 0532   GFRAA 106 10/08/2020 0943   Lipase     Component Value Date/Time   LIPASE 92 (H) 04/18/2024 0416       Studies/Results: CT ANGIO ABDOMEN W &/OR WO CONTRAST Result Date: 04/19/2024 CLINICAL DATA:  Left upper quadrant pain. Evaluate for a pseudoaneurysm at the splenic hilum. EXAM: CT ANGIOGRAPHY OF ABDOMEN WITH CONTRAST TECHNIQUE: Multidetector CT imaging of the abdomen was performed using the standard protocol during bolus administration of intravenous contrast. Multiplanar CT image reconstructions and MIPs were obtained to evaluate the vascular anatomy. RADIATION DOSE REDUCTION: This exam was performed according to the departmental dose-optimization program which includes automated exposure control, adjustment of the mA and/or kV according to patient size and/or use of iterative reconstruction technique. CONTRAST:  80mL OMNIPAQUE  IOHEXOL  350 MG/ML SOLN COMPARISON:  CT abdomen and pelvis dated 04/18/2024 and 04/15/2024 FINDINGS: VASCULAR Aorta: Normal caliber of the abdominal aorta without aneurysm or dissection. Atherosclerotic calcifications involving the abdominal aorta. Celiac: High-grade stenosis involving the  proximal celiac trunk with hook like configuration. These findings are suggestive for median arcuate ligament compression. The main branches of the celiac trunk including the splenic artery, left gastric artery and common hepatic artery are patent. Splenic artery is patent but there is no evidence for a aneurysm or pseudoaneurysm at the pancreatic tail or splenic hilar region. In retrospect, an aneurysm formation was present on the exam from  04/15/2024 and 04/18/2024, most conspicuous on the exam from 04/15/2024. Suspect this could represent a thrombosed pseudoaneurysm. SMA: Patent without evidence of aneurysm, dissection, vasculitis or significant stenosis. Renals: Both renal arteries are patent without evidence of aneurysm, dissection, vasculitis, fibromuscular dysplasia or significant stenosis. IMA: Patent with possible stenosis at the origin. Visualized inflow structures: Visualized bilateral common iliac arteries are patent. Proximal right external and right internal iliac arteries are patent. Veins: Limited evaluation due to the arterial phase of contrast. Cannot evaluate the portal venous system on this examination. Review of the MIP images confirms the above findings. NON-VASCULAR Lower chest: Small left pleural effusion with compressive atelectasis in the left lower lobe. Probable atelectasis in the lingula. Hepatobiliary: Cholecystectomy. Study has limitations from motion artifact. Scattered hypodensities in the liver likely represent hepatic cysts. Extrahepatic bile duct measures roughly 9 mm. No significant intrahepatic biliary dilatation. Pancreas: Mild dilatation of the main pancreatic duct particularly near the pancreatic neck. Again noted are hypodensities or small cystic areas involving the pancreatic tail region and similar to the recent comparison examination. Fluid and mild inflammatory changes in the pancreatic tail, spleen and descending colon. Spleen: Again noted is heterogeneous enhancement in the spleen with irregular subcapsular fluid collections along the superior aspect and along the inferolateral aspect of the spleen. These findings are better characterized on the recent comparison exam from 04/18/2024 and there has been minimal change since the recent comparison examination. Findings could represent subcapsular pseudocyst formations. Adrenals/Urinary Tract: Adrenal glands are within normal limits. Normal appearance of both  kidneys without hydronephrosis. No suspicious renal lesions. Stomach/Bowel: Nasogastric tube terminates in the distal stomach. Stomach is decompressed. Large amount of oral contrast in the ascending colon and cecum. Cecum remains dilated measuring up to 7.8 cm. Contrast has advanced into the descending colon. Again noted is fluid and inflammatory changes surrounding the descending colon. Small irregular pockets of fluid around the descending colon on image 52/4 are similar to the recent comparison examination. Lymphatic: Mildly prominent lymph nodes in the upper abdomen at the gastrohepatic ligament. Other: Inflammatory changes in the left upper abdomen situated around the spleen, distal pancreas and descending colon. Findings are similar to the recent comparison examination. No evidence for free air within the abdomen. Musculoskeletal: No acute bone abnormality. Again noted is extensive endplate sclerosis at L5-S1. IMPRESSION: VASCULAR 1. No evidence for an aneurysm or pseudoaneurysm at the splenic hilum. This represents a change from the exam on 04/15/2024 and 04/18/2024. This may represent a thrombosed pseudoaneurysm. Consider short-term follow-up in 1-2 weeks with triple phase CT of the abdomen to confirm this finding. 2. 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 3.  Aortic Atherosclerosis (ICD10-I70.0). NON-VASCULAR 1. Persistent irregular fluid collections involving the spleen. These are most compatible subcapsular collections and could represent pseudocysts. In addition, the spleen is heterogeneous and this may represent intraparenchymal cysts or even infarcts. Findings are similar to the exam on 04/18/2024. 2. Persistent inflammatory changes and irregular fluid collections in left upper abdomen around the descending colon. Findings are suggestive for small irregular  pseudocyst formations. Difficult to exclude wall thickening / colitis  involving the proximal descending colon. Recommend attention to this area on follow up imaging. 3. Persistent low-density areas involving the pancreatic tail are similar to prior examinations. Findings may represent pseudocyst formations. 4. Small left pleural effusion with compressive atelectasis in left lower lobe. Electronically Signed   By: Elene Griffes M.D.   On: 04/19/2024 14:56   CT ABDOMEN PELVIS W CONTRAST Result Date: 04/18/2024 CLINICAL DATA:  Bowel obstruction suspected.  Abdominal pain. EXAM: CT ABDOMEN AND PELVIS WITH CONTRAST TECHNIQUE: Multidetector CT imaging of the abdomen and pelvis was performed using the standard protocol following bolus administration of intravenous contrast. RADIATION DOSE REDUCTION: This exam was performed according to the departmental dose-optimization program which includes automated exposure control, adjustment of the mA and/or kV according to patient size and/or use of iterative reconstruction technique. CONTRAST:  OMNIPAQUE  IOHEXOL  300 MG/ML  SOLN COMPARISON:  CT abdomen and pelvis 04/15/2024. FINDINGS: Lower chest: There are small bilateral pleural effusions with atelectasis in the bilateral lower lobes, left greater than right. This has not significantly changed. Hepatobiliary: Cholecystectomy clips are present. There is mild intra and extrahepatic biliary ductal dilatation which is similar to the prior study. There are rounded hypodensities in the liver which are too small to characterize and unchanged, likely cysts. Pancreas: Calcifications in the pancreatic head are unchanged. Heterogeneous hypodensity in the pancreatic tail with adjacent pancreatic tail enhancing fluid collection extending into the subcapsular splenic fluid collection has not significantly changed from prior. Lateral perisplenic fluid collection measures 4.9 x 2.4 cm and fluid collection near the diaphragm measures 6.1 x 1.8 cm. Spleen: The spleen is normal in size. Heterogeneous perisplenic  fluid collection appears unchanged. Adrenals/Urinary Tract: Adrenal glands are unremarkable. Kidneys are normal, without renal calculi, focal lesion, or hydronephrosis. Bladder is unremarkable. Stomach/Bowel: There is wall thickening and inflammation of the splenic flexure of the colon as seen on the prior examination. There are enhancing fluid collections extending from the level of the pancreatic tail inferiorly to the hepatic flexure of the colon which have increased from prior. The largest collection adjacent to the colon measures 2.3 x 4.4 by 5.9 cm image 2/33. The colon proximal to this level appears dilated with air-fluid levels. Colon distal to this level is decompressed. Appendix is normal. There is sigmoid colon diverticulosis. Small bowel appears within normal limits. The tip of an enteric tube is in the proximal body of the stomach. The stomach is decompressed. Vascular/Lymphatic: Aortic atherosclerosis. No enlarged abdominal or pelvic lymph nodes. Reproductive: Prostate gland within normal limits. Other: There is trace free fluid. There is no free air. There are small fat containing inguinal hernias. There is subcutaneous air in the lower right anterior abdominal wall which may be related to medication injection site. Musculoskeletal: There is severe degenerative changes at L5-S1. IMPRESSION: 1. Interval increase in enhancing fluid collections extending from the level of the pancreatic tail inferiorly to the hepatic flexure of the colon. Findings are worrisome for abscesses or pseudocyst. 2. Stable wall thickening and inflammation of the splenic flexure of the colon compatible with colitis. 3. The colon proximal to the splenic flexure is not dilated with air-fluid levels worrisome for partial colonic obstruction. 4. Stable enhancing fluid collections adjacent to the pancreatic tail and spleen compatible with pseudocysts. 5. Stable small bilateral pleural effusions with bibasilar atelectasis. 6. Stable  mild intra and extrahepatic biliary ductal dilatation. 7. Aortic atherosclerosis. Aortic Atherosclerosis (ICD10-I70.0). Electronically Signed   By: Amy  Naaman Au M.D.   On: 04/18/2024 20:31   DG Abd Portable 1V Result Date: 04/18/2024 CLINICAL DATA:  Nasogastric tube placement. EXAM: PORTABLE ABDOMEN - 1 VIEW COMPARISON:  Radiograph yesterday FINDINGS: Tip and side port of the enteric tube below the diaphragm in the stomach. Mild gaseous bowel distention in the upper abdomen is similar to yesterday's exam. Multifocal lung scarring. IMPRESSION: Tip and side port of the enteric tube below the diaphragm in the stomach. Electronically Signed   By: Chadwick Colonel M.D.   On: 04/18/2024 16:56    Anti-infectives: Anti-infectives (From admission, onward)    Start     Dose/Rate Route Frequency Ordered Stop   04/19/24 1000  piperacillin -tazobactam (ZOSYN ) IVPB 3.375 g        3.375 g 12.5 mL/hr over 240 Minutes Intravenous Every 8 hours 04/19/24 0854     04/12/24 1000  cefTRIAXone  (ROCEPHIN ) 2 g in sodium chloride  0.9 % 100 mL IVPB  Status:  Discontinued        2 g 200 mL/hr over 30 Minutes Intravenous Daily 04/12/24 0758 04/19/24 0849   04/10/24 2100  metroNIDAZOLE  (FLAGYL ) IVPB 500 mg  Status:  Discontinued        500 mg 100 mL/hr over 60 Minutes Intravenous Every 12 hours 04/10/24 0840 04/19/24 0849   04/10/24 2100  vancomycin  (VANCOCIN ) IVPB 1000 mg/200 mL premix  Status:  Discontinued        1,000 mg 200 mL/hr over 60 Minutes Intravenous Every 12 hours 04/10/24 0940 04/12/24 0758   04/10/24 1600  ceFEPIme  (MAXIPIME ) 2 g in sodium chloride  0.9 % 100 mL IVPB  Status:  Discontinued        2 g 200 mL/hr over 30 Minutes Intravenous Every 8 hours 04/10/24 0940 04/12/24 0758   04/10/24 0830  metroNIDAZOLE  (FLAGYL ) IVPB 500 mg        500 mg 100 mL/hr over 60 Minutes Intravenous  Once 04/10/24 0819 04/10/24 1222   04/10/24 0800  vancomycin  (VANCOREADY) IVPB 1750 mg/350 mL        1,750 mg 175 mL/hr  over 120 Minutes Intravenous  Once 04/10/24 0722 04/10/24 1120   04/10/24 0730  ceFEPIme  (MAXIPIME ) 2 g in sodium chloride  0.9 % 100 mL IVPB        2 g 200 mL/hr over 30 Minutes Intravenous  Once 04/10/24 0722 04/10/24 1430        Assessment/Plan  58 y/o M with acute on chronic pancreatitis with peripancreatic fluid collections Pancreatic pseudoaneurysm  - IR evaluated and sees a pancreatic tail pseudoaneurysm, likely arising from splenic artery, and ordered a CTA to further evaluate. CTA shows two peripancreatic fluid collections that are felt to be thrombosed due to pseudoaneurysm and drainage/aspiration is not recommended. There are some subcapsular splenic fluid collections that they do not recommend draining. There is an additional fluid collection near the descending colon that is too small for drainage. - IV abx - GI following - plan to follow up today after formal read of CTA and make further recs regarding whether EUS may be of benefit    Possible partial large bowel obstruction There is a 2.3 x 4.4 x 5.9 cm collection involving the hepatic flexure with proximal dilation and air fluid levels of the colon, patient could be partially obstructed at this level. No emergent surgical needs. Will need miralax  once able to start CLD. Having more bowel function now but abdomen remains fairly distended and having nausea despite NGT, not sure he is ready for  clamping or PO yet. Will discuss with attending MD   Proximal descending colitis - favor reactive to pancreatitis/perisplenic process but cannot completely exclude diverticulitis given diverticulum on colonoscopy in 2023. No emergent surgical needs. Monitor on IV abx. Will need repeat colonoscopy once acute inflammation improves.   FEN - NPO, NGT, IVF per primary VTE - SCD's ID - Zosyn  5/20>>  - per [facility] -   COVID + COPD GERD Spinal stenosis - on lyrica   Tobacco abuse    LOS: 9 days   I reviewed Consultant IR, GI notes, hospitalist  notes, last 24 h vitals and pain scores, last 48 h intake and output, last 24 h labs and trends, and last 24 h imaging results.  This care required moderate level of medical decision making.    Annetta Killian, [facility] 04/20/2024, 8:46 AM Please see Amion for pager number during [place] hours 7:00am-4:30pm

## 2024-04-20 NOTE — Progress Notes (Addendum)
 Progress Note   Subjective  Chief Complaint: Abdominal pain and chronic pancreatitis with pseudocyst  Today, patient is actually somewhat improved on exam.  He tells me he actually feels better with less abdominal distention after being able to produce a large bowel movement last night and early this morning.  When leaving the room though patient does tell me he is having burning epigastric pain and that is time for his pain meds.  Continues with some nausea.  Tells me the pain he is having now is similar to prior pancreatitis episodes.  NG tube still in place with suctioning of a bile-like material about 100 cc.   Objective   Vital signs in last 24 hours: Temp:  [97.7 F (36.5 C)-98.6 F (37 C)] 98.6 F (37 C) (05/21 0516) Pulse Rate:  [88-109] 109 (05/21 0516) Resp:  [17-20] 20 (05/21 0516) BP: (140-149)/(88-95) 146/88 (05/21 0516) SpO2:  [91 %-97 %] 91 % (05/21 0859) Last BM Date : 04/20/24 General:    AA male in mild distress Heart:  Regular rate and rhythm; no murmurs Lungs: Respirations even and unlabored, lungs CTA bilaterally Abdomen:  Soft, moderate epigastric ttp and mild distension. Normal bowel sounds. Psych:  Cooperative. Normal mood and affect.  Intake/Output from previous [facility]: 05/20 0701 - 05/21 0700 In: 2448.6 [P.O.:240; I.V.:2098.8; IV Piggyback:109.7] Out: 1700 [Urine:1050; Emesis/NG output:650] Intake/Output this shift: Total I/O In: 533 [P.O.:60; I.V.:432.7; IV Piggyback:40.3] Out: 50 [Emesis/NG output:50]  Lab Results: Recent Labs    04/18/24 0416 04/19/24 0914 04/20/24 0532  WBC 17.4* 23.5* 26.5*  HGB 13.5 14.5 13.5  HCT 41.6 43.5 40.0  PLT 370 506* 577*   BMET Recent Labs    04/18/24 0416 04/19/24 0914 04/20/24 0532  NA 131* 130* 131*  K 3.9 3.6 3.6  CL 96* 94* 96*  CO2 24 22 24   GLUCOSE 104* 127* 133*  BUN 10 10 13   CREATININE 0.76 0.83 0.92  CALCIUM 8.3* 8.6* 8.4*   LFT Recent Labs    04/19/24 0914 04/20/24 0532  PROT 7.8   --   ALBUMIN 2.9* 2.7*  AST 21  --   ALT 16  --   ALKPHOS 79  --   BILITOT 0.9  --    Studies/Results: CT ANGIO ABDOMEN W &/OR WO CONTRAST Result Date: 04/19/2024 CLINICAL DATA:  Left upper quadrant pain. Evaluate for a pseudoaneurysm at the splenic hilum. EXAM: CT ANGIOGRAPHY OF ABDOMEN WITH CONTRAST TECHNIQUE: Multidetector CT imaging of the abdomen was performed using the standard protocol during bolus administration of intravenous contrast. Multiplanar CT image reconstructions and MIPs were obtained to evaluate the vascular anatomy. RADIATION DOSE REDUCTION: This exam was performed according to the departmental dose-optimization program which includes automated exposure control, adjustment of the mA and/or kV according to patient size and/or use of iterative reconstruction technique. CONTRAST:  80mL OMNIPAQUE  IOHEXOL  350 MG/ML SOLN COMPARISON:  CT abdomen and pelvis dated 04/18/2024 and 04/15/2024 FINDINGS: VASCULAR Aorta: Normal caliber of the abdominal aorta without aneurysm or dissection. Atherosclerotic calcifications involving the abdominal aorta. Celiac: High-grade stenosis involving the proximal celiac trunk with hook like configuration. These findings are suggestive for median arcuate ligament compression. The main branches of the celiac trunk including the splenic artery, left gastric artery and common hepatic artery are patent. Splenic artery is patent but there is no evidence for a aneurysm or pseudoaneurysm at the pancreatic tail or splenic hilar region. In retrospect, an aneurysm formation was present on the exam from 04/15/2024 and 04/18/2024,  most conspicuous on the exam from 04/15/2024. Suspect this could represent a thrombosed pseudoaneurysm. SMA: Patent without evidence of aneurysm, dissection, vasculitis or significant stenosis. Renals: Both renal arteries are patent without evidence of aneurysm, dissection, vasculitis, fibromuscular dysplasia or significant stenosis. IMA: Patent with  possible stenosis at the origin. Visualized inflow structures: Visualized bilateral common iliac arteries are patent. Proximal right external and right internal iliac arteries are patent. Veins: Limited evaluation due to the arterial phase of contrast. Cannot evaluate the portal venous system on this examination. Review of the MIP images confirms the above findings. NON-VASCULAR Lower chest: Small left pleural effusion with compressive atelectasis in the left lower lobe. Probable atelectasis in the lingula. Hepatobiliary: Cholecystectomy. Study has limitations from motion artifact. Scattered hypodensities in the liver likely represent hepatic cysts. Extrahepatic bile duct measures roughly 9 mm. No significant intrahepatic biliary dilatation. Pancreas: Mild dilatation of the main pancreatic duct particularly near the pancreatic neck. Again noted are hypodensities or small cystic areas involving the pancreatic tail region and similar to the recent comparison examination. Fluid and mild inflammatory changes in the pancreatic tail, spleen and descending colon. Spleen: Again noted is heterogeneous enhancement in the spleen with irregular subcapsular fluid collections along the superior aspect and along the inferolateral aspect of the spleen. These findings are better characterized on the recent comparison exam from 04/18/2024 and there has been minimal change since the recent comparison examination. Findings could represent subcapsular pseudocyst formations. Adrenals/Urinary Tract: Adrenal glands are within normal limits. Normal appearance of both kidneys without hydronephrosis. No suspicious renal lesions. Stomach/Bowel: Nasogastric tube terminates in the distal stomach. Stomach is decompressed. Large amount of oral contrast in the ascending colon and cecum. Cecum remains dilated measuring up to 7.8 cm. Contrast has advanced into the descending colon. Again noted is fluid and inflammatory changes surrounding the  descending colon. Small irregular pockets of fluid around the descending colon on image 52/4 are similar to the recent comparison examination. Lymphatic: Mildly prominent lymph nodes in the upper abdomen at the gastrohepatic ligament. Other: Inflammatory changes in the left upper abdomen situated around the spleen, distal pancreas and descending colon. Findings are similar to the recent comparison examination. No evidence for free air within the abdomen. Musculoskeletal: No acute bone abnormality. Again noted is extensive endplate sclerosis at L5-S1. IMPRESSION: VASCULAR 1. No evidence for an aneurysm or pseudoaneurysm at the splenic hilum. This represents a change from the exam on 04/15/2024 and 04/18/2024. This may represent a thrombosed pseudoaneurysm. Consider short-term follow-up in 1-2 weeks with triple phase CT of the abdomen to confirm this finding. 2. 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 3.  Aortic Atherosclerosis (ICD10-I70.0). NON-VASCULAR 1. Persistent irregular fluid collections involving the spleen. These are most compatible subcapsular collections and could represent pseudocysts. In addition, the spleen is heterogeneous and this may represent intraparenchymal cysts or even infarcts. Findings are similar to the exam on 04/18/2024. 2. Persistent inflammatory changes and irregular fluid collections in left upper abdomen around the descending colon. Findings are suggestive for small irregular pseudocyst formations. Difficult to exclude wall thickening / colitis involving the proximal descending colon. Recommend attention to this area on follow up imaging. 3. Persistent low-density areas involving the pancreatic tail are similar to prior examinations. Findings may represent pseudocyst formations. 4. Small left pleural effusion with compressive atelectasis in left lower lobe. Electronically Signed   By: Elene Griffes M.D.   On: 04/19/2024  14:56   CT ABDOMEN  PELVIS W CONTRAST Result Date: 04/18/2024 CLINICAL DATA:  Bowel obstruction suspected.  Abdominal pain. EXAM: CT ABDOMEN AND PELVIS WITH CONTRAST TECHNIQUE: Multidetector CT imaging of the abdomen and pelvis was performed using the standard protocol following bolus administration of intravenous contrast. RADIATION DOSE REDUCTION: This exam was performed according to the departmental dose-optimization program which includes automated exposure control, adjustment of the mA and/or kV according to patient size and/or use of iterative reconstruction technique. CONTRAST:  OMNIPAQUE  IOHEXOL  300 MG/ML  SOLN COMPARISON:  CT abdomen and pelvis 04/15/2024. FINDINGS: Lower chest: There are small bilateral pleural effusions with atelectasis in the bilateral lower lobes, left greater than right. This has not significantly changed. Hepatobiliary: Cholecystectomy clips are present. There is mild intra and extrahepatic biliary ductal dilatation which is similar to the prior study. There are rounded hypodensities in the liver which are too small to characterize and unchanged, likely cysts. Pancreas: Calcifications in the pancreatic head are unchanged. Heterogeneous hypodensity in the pancreatic tail with adjacent pancreatic tail enhancing fluid collection extending into the subcapsular splenic fluid collection has not significantly changed from prior. Lateral perisplenic fluid collection measures 4.9 x 2.4 cm and fluid collection near the diaphragm measures 6.1 x 1.8 cm. Spleen: The spleen is normal in size. Heterogeneous perisplenic fluid collection appears unchanged. Adrenals/Urinary Tract: Adrenal glands are unremarkable. Kidneys are normal, without renal calculi, focal lesion, or hydronephrosis. Bladder is unremarkable. Stomach/Bowel: There is wall thickening and inflammation of the splenic flexure of the colon as seen on the prior examination. There are enhancing fluid collections extending from the  level of the pancreatic tail inferiorly to the hepatic flexure of the colon which have increased from prior. The largest collection adjacent to the colon measures 2.3 x 4.4 by 5.9 cm image 2/33. The colon proximal to this level appears dilated with air-fluid levels. Colon distal to this level is decompressed. Appendix is normal. There is sigmoid colon diverticulosis. Small bowel appears within normal limits. The tip of an enteric tube is in the proximal body of the stomach. The stomach is decompressed. Vascular/Lymphatic: Aortic atherosclerosis. No enlarged abdominal or pelvic lymph nodes. Reproductive: Prostate gland within normal limits. Other: There is trace free fluid. There is no free air. There are small fat containing inguinal hernias. There is subcutaneous air in the lower right anterior abdominal wall which may be related to medication injection site. Musculoskeletal: There is severe degenerative changes at L5-S1. IMPRESSION: 1. Interval increase in enhancing fluid collections extending from the level of the pancreatic tail inferiorly to the hepatic flexure of the colon. Findings are worrisome for abscesses or pseudocyst. 2. Stable wall thickening and inflammation of the splenic flexure of the colon compatible with colitis. 3. The colon proximal to the splenic flexure is not dilated with air-fluid levels worrisome for partial colonic obstruction. 4. Stable enhancing fluid collections adjacent to the pancreatic tail and spleen compatible with pseudocysts. 5. Stable small bilateral pleural effusions with bibasilar atelectasis. 6. Stable mild intra and extrahepatic biliary ductal dilatation. 7. Aortic atherosclerosis. Aortic Atherosclerosis (ICD10-I70.0). Electronically Signed   By: Tyron Gallon M.D.   On: 04/18/2024 20:31   DG Abd Portable 1V Result Date: 04/18/2024 CLINICAL DATA:  Nasogastric tube placement. EXAM: PORTABLE ABDOMEN - 1 VIEW COMPARISON:  Radiograph yesterday FINDINGS: Tip and side port of  the enteric tube below the diaphragm in the stomach. Mild gaseous bowel distention in the upper abdomen is similar to yesterday's exam. Multifocal lung scarring. IMPRESSION: Tip and side port of the enteric  tube below the diaphragm in the stomach. Electronically Signed   By: Chadwick Colonel M.D.   On: 04/18/2024 16:56    Assessment / Plan:   Assessment: 1.  Chronic pancreatitis with pseudocyst: With expanding pseudocyst and fluid collection, patient initially started on IV cefepime , IV Flagyl  and IV vancomycin , continued on [name]  this admission, currently pain is now similar to prior episodes of chronic pancreatitis with a burning in his epigastrium, CTA will be reviewed today 2.  Acute exacerbation of COPD: In the setting of COVID infection on admission 04/10/2024 with increased cough, wheezing, DOE and significantly improved after ER treatment and initial he on Prednisone  3.  COVID-19 4.  Suspected lung cancer 5.  GERD/gastritis  Plan: 1.  Will review CTA with Dr. Brice Campi.  Patient actually seems somewhat better symptomatically.  Can consider repeat of smog enema if needed. 2.  Continue analgesics 3.  Continue antibiotics  Thank you for your kind consultation, we will continue to follow.   LOS: 9 days   Graciella Lavender  04/20/2024, 10:03 AM   Addendum: 3:15 pm Per Dr. Brice Campi ordered repeat SMOG enema and clamp NG tube.  JLL

## 2024-04-20 NOTE — Progress Notes (Signed)
 Received report from off going RN. Agree with previous assessment. Pt on the phone, resting in bed.

## 2024-04-21 ENCOUNTER — Inpatient Hospital Stay (HOSPITAL_COMMUNITY)

## 2024-04-21 DIAGNOSIS — R1084 Generalized abdominal pain: Secondary | ICD-10-CM | POA: Diagnosis not present

## 2024-04-21 DIAGNOSIS — K8689 Other specified diseases of pancreas: Secondary | ICD-10-CM

## 2024-04-21 DIAGNOSIS — R140 Abdominal distension (gaseous): Secondary | ICD-10-CM | POA: Diagnosis not present

## 2024-04-21 DIAGNOSIS — K859 Acute pancreatitis without necrosis or infection, unspecified: Secondary | ICD-10-CM

## 2024-04-21 DIAGNOSIS — J441 Chronic obstructive pulmonary disease with (acute) exacerbation: Secondary | ICD-10-CM | POA: Diagnosis not present

## 2024-04-21 DIAGNOSIS — K861 Other chronic pancreatitis: Secondary | ICD-10-CM

## 2024-04-21 DIAGNOSIS — K5903 Drug induced constipation: Secondary | ICD-10-CM

## 2024-04-21 DIAGNOSIS — E440 Moderate protein-calorie malnutrition: Secondary | ICD-10-CM | POA: Insufficient documentation

## 2024-04-21 DIAGNOSIS — R1032 Left lower quadrant pain: Secondary | ICD-10-CM | POA: Diagnosis not present

## 2024-04-21 LAB — CBC
HCT: 37.6 % — ABNORMAL LOW (ref 39.0–52.0)
Hemoglobin: 12.2 g/dL — ABNORMAL LOW (ref 13.0–17.0)
MCH: 29 pg (ref 26.0–34.0)
MCHC: 32.4 g/dL (ref 30.0–36.0)
MCV: 89.3 fL (ref 80.0–100.0)
Platelets: 593 10*3/uL — ABNORMAL HIGH (ref 150–400)
RBC: 4.21 MIL/uL — ABNORMAL LOW (ref 4.22–5.81)
RDW: 16.7 % — ABNORMAL HIGH (ref 11.5–15.5)
WBC: 24.7 10*3/uL — ABNORMAL HIGH (ref 4.0–10.5)
nRBC: 0 % (ref 0.0–0.2)

## 2024-04-21 LAB — RENAL FUNCTION PANEL
Albumin: 2.4 g/dL — ABNORMAL LOW (ref 3.5–5.0)
Anion gap: 9 (ref 5–15)
BUN: 10 mg/dL (ref 6–20)
CO2: 25 mmol/L (ref 22–32)
Calcium: 7.9 mg/dL — ABNORMAL LOW (ref 8.9–10.3)
Chloride: 96 mmol/L — ABNORMAL LOW (ref 98–111)
Creatinine, Ser: 0.87 mg/dL (ref 0.61–1.24)
GFR, Estimated: >60 mL/min (ref >60)
Glucose, Bld: 105 mg/dL — ABNORMAL HIGH (ref 70–99)
Phosphorus: 2.7 mg/dL (ref 2.5–4.6)
Potassium: 3.4 mmol/L — ABNORMAL LOW (ref 3.5–5.1)
Sodium: 130 mmol/L — ABNORMAL LOW (ref 135–145)

## 2024-04-21 LAB — TROPONIN I (HIGH SENSITIVITY): Troponin I (High Sensitivity): 6 ng/L (ref <18)

## 2024-04-21 MED ORDER — HYDROMORPHONE HCL 1 MG/ML IJ SOLN: 0.5000 mg | Freq: Once | INTRAMUSCULAR | Status: AC

## 2024-04-21 MED ORDER — PHENOL 1.4 % MT LIQD
1.0000 | OROMUCOSAL | Status: DC | PRN
  Administered 2024-04-21: 1 via OROMUCOSAL
  Filled 2024-04-21: qty 177

## 2024-04-21 MED ORDER — B COMPLEX-C PO TABS
1.0000 | ORAL_TABLET | Freq: Every day | ORAL | Status: DC
  Administered 2024-04-21 – 2024-05-05 (×15): 1 via ORAL
  Filled 2024-04-21 (×15): qty 1

## 2024-04-21 MED ORDER — BOOST / RESOURCE BREEZE PO LIQD CUSTOM
1.0000 | Freq: Three times a day (TID) | ORAL | Status: DC
  Administered 2024-04-21 – 2024-05-02 (×22): 1 via ORAL

## 2024-04-21 MED ORDER — HYDROMORPHONE HCL 1 MG/ML IJ SOLN
INTRAMUSCULAR | Status: AC
  Administered 2024-04-21: 0.5 mg via INTRAVENOUS
  Filled 2024-04-21: qty 0.5

## 2024-04-21 NOTE — Progress Notes (Signed)
 Mobility Specialist - Progress Note   04/21/24 1130  Mobility  Activity Ambulated with assistance in hallway  Level of Assistance Modified independent, requires aide device or extra time  Assistive Device Other (Comment) (HHA)  Distance Ambulated (ft) 250 ft  Activity Response Tolerated well  Mobility Referral Yes  Mobility visit 1 Mobility  Mobility Specialist Start Time (ACUTE ONLY) 1117  Mobility Specialist Stop Time (ACUTE ONLY) 1129  Mobility Specialist Time Calculation (min) (ACUTE ONLY) 12 min   Pt received in bed and agreeable to mobility. Pt held onto hallway rails during session. Pt to bed after session with all needs met.    [facility]

## 2024-04-21 NOTE — Progress Notes (Signed)
 Progress Note     Subjective: Pt reports continued bowel function. Reports abdominal pain is overall improving. Denies nausea today.   Objective: Vital signs in last 24 hours: Temp:  [98.2 F (36.8 C)-99.2 F (37.3 C)] 98.2 F (36.8 C) (05/22 0637) Pulse Rate:  [97-106] 98 (05/22 0637) Resp:  [17-18] 18 (05/22 0637) BP: (123-129)/(86-92) 126/88 (05/22 0637) SpO2:  [94 %-96 %] 96 % (05/22 0637) Last BM Date : 04/20/24  Intake/Output from previous Ander: 05/21 0701 - 05/22 0700 In: 2168.6 [P.O.:60; I.V.:1968.3; IV Piggyback:140.3] Out: 50 [Emesis/NG output:50] Intake/Output this shift: No intake/output data recorded.  PE: General: pleasant, WD male who is laying in bed in NAD HEENT: sclera anicteric Heart: RRR Lungs: Respiratory effort nonlabored Abd: soft, moderately distended, mild generalized ttp without peritonitis, prior surgical scars noted, NGT clamped this AM Psych: A&Ox3 with an appropriate affect.    Lab Results:  Recent Labs    04/20/24 0532 04/21/24 0529  WBC 26.5* 24.7*  HGB 13.5 12.2*  HCT 40.0 37.6*  PLT 577* 593*   BMET Recent Labs    04/20/24 0532 04/21/24 0530  NA 131* 130*  K 3.6 3.4*  CL 96* 96*  CO2 24 25  GLUCOSE 133* 105*  BUN 13 10  CREATININE 0.92 0.87  CALCIUM 8.4* 7.9*   PT/INR No results for input(s): "LABPROT", "INR" in the last 72 hours. CMP     Component Value Date/Time   NA 130 (L) 04/21/2024 0530   NA 139 09/17/2022 1239   K 3.4 (L) 04/21/2024 0530   CL 96 (L) 04/21/2024 0530   CO2 25 04/21/2024 0530   GLUCOSE 105 (H) 04/21/2024 0530   BUN 10 04/21/2024 0530   BUN 6 09/17/2022 1239   CREATININE 0.87 04/21/2024 0530   CREATININE 0.94 07/26/2019 1210   CALCIUM 7.9 (L) 04/21/2024 0530   PROT 7.8 04/19/2024 0914   PROT 7.5 09/17/2022 1239   ALBUMIN 2.4 (L) 04/21/2024 0530   ALBUMIN 3.6 (L) 09/17/2022 1239   AST 21 04/19/2024 0914   ALT 16 04/19/2024 0914   ALT 24 05/02/2019 1617   ALKPHOS 79 04/19/2024 0914    BILITOT 0.9 04/19/2024 0914   BILITOT <0.2 09/17/2022 1239   GFRNONAA >60 04/21/2024 0530   GFRAA 106 10/08/2020 0943   Lipase     Component Value Date/Time   LIPASE 92 (H) 04/18/2024 0416       Studies/Results: CT ANGIO ABDOMEN W &/OR WO CONTRAST Result Date: 04/19/2024 CLINICAL DATA:  Left upper quadrant pain. Evaluate for a pseudoaneurysm at the splenic hilum. EXAM: CT ANGIOGRAPHY OF ABDOMEN WITH CONTRAST TECHNIQUE: Multidetector CT imaging of the abdomen was performed using the standard protocol during bolus administration of intravenous contrast. Multiplanar CT image reconstructions and MIPs were obtained to evaluate the vascular anatomy. RADIATION DOSE REDUCTION: This exam was performed according to the departmental dose-optimization program which includes automated exposure control, adjustment of the mA and/or kV according to patient size and/or use of iterative reconstruction technique. CONTRAST:  80mL OMNIPAQUE  IOHEXOL  350 MG/ML SOLN COMPARISON:  CT abdomen and pelvis dated 04/18/2024 and 04/15/2024 FINDINGS: VASCULAR Aorta: Normal caliber of the abdominal aorta without aneurysm or dissection. Atherosclerotic calcifications involving the abdominal aorta. Celiac: High-grade stenosis involving the proximal celiac trunk with hook like configuration. These findings are suggestive for median arcuate ligament compression. The main branches of the celiac trunk including the splenic artery, left gastric artery and common hepatic artery are patent. Splenic artery is patent but there is  no evidence for a aneurysm or pseudoaneurysm at the pancreatic tail or splenic hilar region. In retrospect, an aneurysm formation was present on the exam from 04/15/2024 and 04/18/2024, most conspicuous on the exam from 04/15/2024. Suspect this could represent a thrombosed pseudoaneurysm. SMA: Patent without evidence of aneurysm, dissection, vasculitis or significant stenosis. Renals: Both renal arteries are patent  without evidence of aneurysm, dissection, vasculitis, fibromuscular dysplasia or significant stenosis. IMA: Patent with possible stenosis at the origin. Visualized inflow structures: Visualized bilateral common iliac arteries are patent. Proximal right external and right internal iliac arteries are patent. Veins: Limited evaluation due to the arterial phase of contrast. Cannot evaluate the portal venous system on this examination. Review of the MIP images confirms the above findings. NON-VASCULAR Lower chest: Small left pleural effusion with compressive atelectasis in the left lower lobe. Probable atelectasis in the lingula. Hepatobiliary: Cholecystectomy. Study has limitations from motion artifact. Scattered hypodensities in the liver likely represent hepatic cysts. Extrahepatic bile duct measures roughly 9 mm. No significant intrahepatic biliary dilatation. Pancreas: Mild dilatation of the main pancreatic duct particularly near the pancreatic neck. Again noted are hypodensities or small cystic areas involving the pancreatic tail region and similar to the recent comparison examination. Fluid and mild inflammatory changes in the pancreatic tail, spleen and descending colon. Spleen: Again noted is heterogeneous enhancement in the spleen with irregular subcapsular fluid collections along the superior aspect and along the inferolateral aspect of the spleen. These findings are better characterized on the recent comparison exam from 04/18/2024 and there has been minimal change since the recent comparison examination. Findings could represent subcapsular pseudocyst formations. Adrenals/Urinary Tract: Adrenal glands are within normal limits. Normal appearance of both kidneys without hydronephrosis. No suspicious renal lesions. Stomach/Bowel: Nasogastric tube terminates in the distal stomach. Stomach is decompressed. Large amount of oral contrast in the ascending colon and cecum. Cecum remains dilated measuring up to 7.8  cm. Contrast has advanced into the descending colon. Again noted is fluid and inflammatory changes surrounding the descending colon. Small irregular pockets of fluid around the descending colon on image 52/4 are similar to the recent comparison examination. Lymphatic: Mildly prominent lymph nodes in the upper abdomen at the gastrohepatic ligament. Other: Inflammatory changes in the left upper abdomen situated around the spleen, distal pancreas and descending colon. Findings are similar to the recent comparison examination. No evidence for free air within the abdomen. Musculoskeletal: No acute bone abnormality. Again noted is extensive endplate sclerosis at L5-S1. IMPRESSION: VASCULAR 1. No evidence for an aneurysm or pseudoaneurysm at the splenic hilum. This represents a change from the exam on 04/15/2024 and 04/18/2024. This may represent a thrombosed pseudoaneurysm. Consider short-term follow-up in 1-2 weeks with triple phase CT of the abdomen to confirm this finding. 2. 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 3.  Aortic Atherosclerosis (ICD10-I70.0). NON-VASCULAR 1. Persistent irregular fluid collections involving the spleen. These are most compatible subcapsular collections and could represent pseudocysts. In addition, the spleen is heterogeneous and this may represent intraparenchymal cysts or even infarcts. Findings are similar to the exam on 04/18/2024. 2. Persistent inflammatory changes and irregular fluid collections in left upper abdomen around the descending colon. Findings are suggestive for small irregular pseudocyst formations. Difficult to exclude wall thickening / colitis involving the proximal descending colon. Recommend attention to this area on follow up imaging. 3. Persistent low-density areas involving the pancreatic tail are similar to prior examinations. Findings may represent pseudocyst formations. 4. Small  left pleural  effusion with compressive atelectasis in left lower lobe. Electronically Signed   By: Elene Griffes M.D.   On: 04/19/2024 14:56    Anti-infectives: Anti-infectives (From admission, onward)    Start     Dose/Rate Route Frequency Ordered Stop   04/19/24 1000  piperacillin -tazobactam (ZOSYN ) IVPB 3.375 g        3.375 g 12.5 mL/hr over 240 Minutes Intravenous Every 8 hours 04/19/24 0854     04/12/24 1000  cefTRIAXone  (ROCEPHIN ) 2 g in sodium chloride  0.9 % 100 mL IVPB  Status:  Discontinued        2 g 200 mL/hr over 30 Minutes Intravenous Daily 04/12/24 0758 04/19/24 0849   04/10/24 2100  metroNIDAZOLE  (FLAGYL ) IVPB 500 mg  Status:  Discontinued        500 mg 100 mL/hr over 60 Minutes Intravenous Every 12 hours 04/10/24 0840 04/19/24 0849   04/10/24 2100  vancomycin  (VANCOCIN ) IVPB 1000 mg/200 mL premix  Status:  Discontinued        1,000 mg 200 mL/hr over 60 Minutes Intravenous Every 12 hours 04/10/24 0940 04/12/24 0758   04/10/24 1600  ceFEPIme  (MAXIPIME ) 2 g in sodium chloride  0.9 % 100 mL IVPB  Status:  Discontinued        2 g 200 mL/hr over 30 Minutes Intravenous Every 8 hours 04/10/24 0940 04/12/24 0758   04/10/24 0830  metroNIDAZOLE  (FLAGYL ) IVPB 500 mg        500 mg 100 mL/hr over 60 Minutes Intravenous  Once 04/10/24 0819 04/10/24 1222   04/10/24 0800  vancomycin  (VANCOREADY) IVPB 1750 mg/350 mL        1,750 mg 175 mL/hr over 120 Minutes Intravenous  Once 04/10/24 0722 04/10/24 1120   04/10/24 0730  ceFEPIme  (MAXIPIME ) 2 g in sodium chloride  0.9 % 100 mL IVPB        2 g 200 mL/hr over 30 Minutes Intravenous  Once 04/10/24 0722 04/10/24 1430        Assessment/Plan  58 y/o M with acute on chronic pancreatitis with peripancreatic fluid collections Pancreatic pseudoaneurysm  - IR evaluated and sees a pancreatic tail pseudoaneurysm, likely arising from splenic artery, and ordered a CTA to further evaluate. CTA shows two peripancreatic fluid collections that are felt to be  thrombosed due to pseudoaneurysm and drainage/aspiration is not recommended. There are some subcapsular splenic fluid collections that they do not recommend draining. There is an additional fluid collection near the descending colon that is too small for drainage. - IV abx - GI following - no plans for endoscopic intervention currently but will continue to reassess if things change   Possible partial large bowel obstruction There is a 2.3 x 4.4 x 5.9 cm collection involving the hepatic flexure with proximal dilation and air fluid levels of the colon, patient could be partially obstructed at this level. No emergent surgical needs. Will need miralax  once able to start CLD. Continues to have more bowel function, will trial NGT clamping and CLD this AM. Maybe able to remove NGT later today if tolerating ok.    Proximal descending colitis - favor reactive to pancreatitis/perisplenic process but cannot completely exclude diverticulitis given diverticulum on colonoscopy in 2023. No emergent surgical needs. Monitor on IV abx. Will need repeat colonoscopy once acute inflammation improves.   FEN - CLD, NGT clamp trial, IVF per primary VTE - SCD's ID - Zosyn  5/20>>  - per TRH -   COVID + COPD GERD Spinal stenosis - on  lyrica   Tobacco abuse    LOS: 10 days   I reviewed Consultant GI notes, hospitalist notes, last 24 h vitals and pain scores, last 48 h intake and output, last 24 h labs and trends, and last 24 h imaging results.  This care required moderate level of medical decision making.    Annetta Killian, [facility] Surgery 04/21/2024, 10:36 AM Please see Amion for pager number during [place] hours 7:00am-4:30pm

## 2024-04-21 NOTE — Progress Notes (Signed)
 Initial Nutrition Assessment  DOCUMENTATION CODES:   Non-severe (moderate) malnutrition in context of acute illness/injury  INTERVENTION:   -Boost Breeze po TID, each supplement provides 250 kcal and 9 grams of protein while on clear liquids  Once diet advanced: Ensure MAX Protein po BID, each supplement provides 150 kcal and 30 grams of protein   If unable to have diet advanced, need to consider Cortrak placement so patient can meet nutritional needs.   -B complex w/ Vitamin C daily  NUTRITION DIAGNOSIS:   Moderate Malnutrition related to acute illness as evidenced by mild fat depletion, energy intake < 75% for > 7 days.  GOAL:   Patient will meet greater than or equal to 90% of their needs  MONITOR:   PO intake, Supplement acceptance, Diet advancement  REASON FOR ASSESSMENT:   NPO/Clear Liquid Diet x 10 days    ASSESSMENT:   58 year old male with history of suspected lung CA, GERD, COPD, chronic pancreatitis with pseudocyst presented to the hospital with cough, abdominal pain.  He was found to have COPD exacerbation in the setting of COVID infection along with acute on chronic pancreatitis complicated by pseudocyst.  Patient on LOS Caprara 11. Has not been able to have diet advanced past clears since 5/13 (9 days ago). States last time he ate something solid was some french toast and then some chicken salad when he first was admitted. Pt reports he kept this down but was having lots of pain with intakes.  Pt with NGT since 5/19. Currently clamped with plans to remove today if tolerating clear liquids. Pt is having BMs. RN in room and brought pt some ginger ale, states a tray is on the way as well. RD to order Boost Breeze for pt to try later once tube is out for additional kcals and protein. If unable to have diet advanced, need to consider Cortrak placement so patient can meet nutritional needs. At refeeding risk currently given no PO for 9 days.   Per weight records, pt's  weight has increased since January. Need to monitor. Last weight recorded was 5/11. Likely needs new weight to assess.  Medications: Vitamin B-12, Colace, [name] , Multivitamin with minerals daily, Zofran   Labs reviewed: Low Na Low K   NUTRITION - FOCUSED PHYSICAL EXAM:  Flowsheet Row Most Recent Value  Orbital Region Mild depletion  Upper Arm Region Mild depletion  Thoracic and Lumbar Region No depletion  Buccal Region Mild depletion  Temple Region Mild depletion  Clavicle Bone Region Unable to assess  Clavicle and Acromion Bone Region Unable to assess  Scapular Bone Region Unable to assess  Dorsal Hand Unable to assess  Patellar Region Unable to assess  Anterior Thigh Region Unable to assess  Posterior Calf Region Unable to assess  Edema (RD Assessment) None  Hair Reviewed  Eyes Reviewed  Mouth Reviewed  Skin Reviewed  Nails Unable to assess       Diet Order:   Diet Order             Diet clear liquid Room service appropriate? Yes; Fluid consistency: Thin  Diet effective now                   EDUCATION NEEDS:   Education needs have been addressed  Skin:  Skin Assessment: Reviewed RN Assessment  Last BM:  5/21 -type 7  Height:   Ht Readings from Last 1 Encounters:  04/10/24 5\' 8"  (1.727 m)    Weight:   Wt Readings from  Last 1 Encounters:  04/10/24 79.4 kg    BMI:  Body mass index is 26.61 kg/m.  Estimated Nutritional Needs:   Kcal:  2100-2300  Protein:  115-130g  Fluid:  2.1L/[place]   Arna Better, MS, RD, LDN Inpatient Clinical Dietitian Contact via Secure chat

## 2024-04-21 NOTE — Discharge Instructions (Addendum)
 Wesley [name],  You were in the hospital with pneumonia, pancreatitis and colitis. You have improved with pain medication, IV fluids and antibiotics. Please follow-up with your PCP and your gastroenterologist.   Pancreatitis Nutrition Therapy  The pancreas helps your body digest and absorb nutrients in food. Pancreatitis prevents the body from digesting food well, especially if the food is high in fat.  This nutrition therapy limits the fat in your diet while providing nutrients you need. Your goal is to eat as near to a normal diet as possible without experiencing gastrointestinal (GI) symptoms. These symptoms include stomach pain, bloating, weight loss or difficulty maintaining weight, vomiting, burping, loose stools, and steatorrhea. The effects of pancreatitis are different for all individuals, so it is important to work with your registered dietitian nutritionist (RDN) to determine which foods trigger your GI symptoms. Your RDN can also help you figure out your tolerance to fat in foods and how to manage your symptoms with your diet.  Tips You may need to take pancreatic enzymes if you have frequent, loose stools after mealtimes. Individuals who take pancreatic enzymes will need to take the prescribed dosage at the start of a meal or snack. Individuals who are prescribed a low-fat diet will need to limit fats and oils to no more than 6 teaspoons (30 grams) daily. Up to 1 ounce of avocado can also be substituted for 1 teaspoon of fat. A low-fat diet may not be needed if you are taking pancreatic enzymes. Avoid drinking alcohol. Keep a bottle of water with you at all times to stay hydrated and ensure you are getting in enough fluid each [place]. The general recommendation is to drink 8 cups (64 ounces) of fluids daily. Eat small, frequent meals (4-6) throughout the [place] to help you recover from pancreatitis or to maintain your normal body weight. Eat more whole fruits and vegetables rather than  drinking fruit and vegetable juices.  Fiber is found in whole grain foods and slows digestion. You may need to choose whole grain foods less often if you feel full quickly after eating. Ask your RDN for recommendations on managing your diet if you also have other conditions, such as diabetes mellitus. Your RDN might recommend a vitamin and mineral supplement or fat-soluble vitamin supplements if you require higher amounts of these nutrients.  Foods Recommended and Foods Not Recommended The following list of foods may be helpful if you need to limit your fat intake. Food Group Foods Recommended Foods Not Recommended  Grains Breads: Bagels, buns, English muffins Hot/cold cereals Couscous Low-fat crackers Pancakes Pasta Popcorn, air popped Rice Corn or flour tortilla Products made with added fat (such as biscuits, waffles, and regular crackers) High-fat bakery products such as doughnuts, biscuits, croissants, Danish pastries, pies, cookies Snacks made with partially hydrogenated oils including chips, cheese puffs, snack mixes, regular crackers, butter-flavored popcorn  Protein Foods Lean cuts of poultry (without skin) such as chicken or [place] Low-fat hamburger (for example, 7% fat) Lean cuts of fish (white fish) Canned tuna in water Egg whites or egg substitute Lean deli meats such as [place], chicken, lean beef Non-animal protein sources (tofu, legumes, beans, lentils) Smooth nut butters     Higher-fat cuts of meats such as ribs, T-bone steak, regular hamburger (15% to 20% fat) Full-fat processed meats (hot dogs, bologna, salami, sausage, bacon, etc) Red meats Organ meats (liver, brains, sweetbreads) Poultry with skin Fried meat, poultry, tofu, and fish Whole eggs and egg yolks Full-fat refried beans Tree nuts and  peanuts  Dairy and Dairy Alternatives 1% or fat-free dairy (milk, yogurt, cheese, cottage cheese, sour cream) Frozen yogurt Fortified non-dairy milk (almond, rice, soy,  etc.) Creamy/cheesy sauces Cream Whole-fat or reduced-fat (2%) dairy (milk, yogurt, ice cream, cheese) Milkshakes Half-and-half Cream cheese Sour cream Coconut milk  Vegetables All fresh, frozen, or canned vegetables Fried or stir-fried vegetables Vegetables prepared with butter, cheese, or cream sauce  Fruit All fresh, frozen, or canned fruit Fried fruits Fruit served with butter or cream  Fats and Oils All vegetable oils   Butter, stick margarine, shortening, partially hydrogenated oils, tropical oils (coconut, palm, and palm kernel oils)   Pancreatitis Sample 1-Cast Menu View Nutrient Info Breakfast 2 egg whites, cooked 1 whole wheat bagel 1 tablespoon low-fat cream cheese 1 cup fat-free milk  cup blueberries  Lunch 2 slices bread 2 ounces [place] breast 2 leaves lettuce 2 slices tomato 1 teaspoon mustard 2 teaspoons nonfat mayonnaise 1 cup carrots  cup pineapple 1 cup fat-free milk  Evening Meal 3 ounces tilapia, baked  cup cooked rice  cup zucchini 1 cup lettuce for salad 2 teaspoons fat-free [place] dressing, for salad 1 dinner roll 1 teaspoon margarine  Evening Snack  cup low-fat yogurt 1 cup strawberries 1 ounce pretzels  Daily Sum Nutrient Unit Value  Macronutrients  Energy kcal 1560  Energy kJ 6541  Protein g 103  Total lipid (fat) g 23  Carbohydrate, by difference g 241  Fiber, total dietary g 23  Sugars, total g 104  Minerals  Calcium, Ca mg 1159  Iron, Fe mg 12  Sodium, Na mg 2034  Vitamins  Vitamin C, total ascorbic acid mg 158  Vitamin A, IU IU 28445  Vitamin D  IU 457  Lipids  Fatty acids, total saturated g 6  Fatty acids, total monounsaturated g 6  Fatty acids, total polyunsaturated g 8  Cholesterol mg 123

## 2024-04-21 NOTE — Progress Notes (Signed)
 Progress Note   Subjective  Chief Complaint: Abdominal pain and chronic pancreatitis with pseudocyst  Patient continues to improve today.  He does have chronic epigastric pain which is really unchanged and remains on scheduled pain meds, but other than this seems to be doing better.  He also has some chronic level of nausea, but ready to get his NG tube tube out to see how he is doing.  He was able to achieve 2 more bowel movements yesterday and this morning after smog enema.   Objective   Vital signs in last 24 hours: Temp:  [98.2 F (36.8 C)-99.2 F (37.3 C)] 98.2 F (36.8 C) (05/22 0637) Pulse Rate:  [97-106] 98 (05/22 0637) Resp:  [17-18] 18 (05/22 0637) BP: (123-129)/(86-92) 126/88 (05/22 5409) SpO2:  [94 %-96 %] 96 % (05/22 0637) Last BM Date : 04/20/24 General: AA male in NAD Heart:  Regular rate and rhythm; no murmurs Lungs: Respirations even and unlabored, lungs CTA bilaterally Abdomen:  Soft, moderate epigastric TTP and nondistended. Normal bowel sounds. Psych:  Cooperative. Normal mood and affect.  Intake/Output from previous [place]: 05/21 0701 - 05/22 0700 In: 2168.6 [P.O.:60; I.V.:1968.3; IV Piggyback:140.3] Out: 50 [Emesis/NG output:50]   Lab Results: Recent Labs    04/19/24 0914 04/20/24 0532 04/21/24 0529  WBC 23.5* 26.5* 24.7*  HGB 14.5 13.5 12.2*  HCT 43.5 40.0 37.6*  PLT 506* 577* 593*   BMET Recent Labs    04/19/24 0914 04/20/24 0532 04/21/24 0530  NA 130* 131* 130*  K 3.6 3.6 3.4*  CL 94* 96* 96*  CO2 22 24 25   GLUCOSE 127* 133* 105*  BUN 10 13 10   CREATININE 0.83 0.92 0.87  CALCIUM 8.6* 8.4* 7.9*   LFT Recent Labs    04/19/24 0914 04/20/24 0532 04/21/24 0530  PROT 7.8  --   --   ALBUMIN 2.9*   < > 2.4*  AST 21  --   --   ALT 16  --   --   ALKPHOS 79  --   --   BILITOT 0.9  --   --    < > = values in this interval not displayed.    Studies/Results: CT ANGIO ABDOMEN W &/OR WO CONTRAST Result Date: 04/19/2024 CLINICAL  DATA:  Left upper quadrant pain. Evaluate for a pseudoaneurysm at the splenic hilum. EXAM: CT ANGIOGRAPHY OF ABDOMEN WITH CONTRAST TECHNIQUE: Multidetector CT imaging of the abdomen was performed using the standard protocol during bolus administration of intravenous contrast. Multiplanar CT image reconstructions and MIPs were obtained to evaluate the vascular anatomy. RADIATION DOSE REDUCTION: This exam was performed according to the departmental dose-optimization program which includes automated exposure control, adjustment of the mA and/or kV according to patient size and/or use of iterative reconstruction technique. CONTRAST:  80mL OMNIPAQUE  IOHEXOL  350 MG/ML SOLN COMPARISON:  CT abdomen and pelvis dated 04/18/2024 and 04/15/2024 FINDINGS: VASCULAR Aorta: Normal caliber of the abdominal aorta without aneurysm or dissection. Atherosclerotic calcifications involving the abdominal aorta. Celiac: High-grade stenosis involving the proximal celiac trunk with hook like configuration. These findings are suggestive for median arcuate ligament compression. The main branches of the celiac trunk including the splenic artery, left gastric artery and common hepatic artery are patent. Splenic artery is patent but there is no evidence for a aneurysm or pseudoaneurysm at the pancreatic tail or splenic hilar region. In retrospect, an aneurysm formation was present on the exam from 04/15/2024 and 04/18/2024, most conspicuous on the exam from 04/15/2024. Suspect this  could represent a thrombosed pseudoaneurysm. SMA: Patent without evidence of aneurysm, dissection, vasculitis or significant stenosis. Renals: Both renal arteries are patent without evidence of aneurysm, dissection, vasculitis, fibromuscular dysplasia or significant stenosis. IMA: Patent with possible stenosis at the origin. Visualized inflow structures: Visualized bilateral common iliac arteries are patent. Proximal right external and right internal iliac arteries are  patent. Veins: Limited evaluation due to the arterial phase of contrast. Cannot evaluate the portal venous system on this examination. Review of the MIP images confirms the above findings. NON-VASCULAR Lower chest: Small left pleural effusion with compressive atelectasis in the left lower lobe. Probable atelectasis in the lingula. Hepatobiliary: Cholecystectomy. Study has limitations from motion artifact. Scattered hypodensities in the liver likely represent hepatic cysts. Extrahepatic bile duct measures roughly 9 mm. No significant intrahepatic biliary dilatation. Pancreas: Mild dilatation of the main pancreatic duct particularly near the pancreatic neck. Again noted are hypodensities or small cystic areas involving the pancreatic tail region and similar to the recent comparison examination. Fluid and mild inflammatory changes in the pancreatic tail, spleen and descending colon. Spleen: Again noted is heterogeneous enhancement in the spleen with irregular subcapsular fluid collections along the superior aspect and along the inferolateral aspect of the spleen. These findings are better characterized on the recent comparison exam from 04/18/2024 and there has been minimal change since the recent comparison examination. Findings could represent subcapsular pseudocyst formations. Adrenals/Urinary Tract: Adrenal glands are within normal limits. Normal appearance of both kidneys without hydronephrosis. No suspicious renal lesions. Stomach/Bowel: Nasogastric tube terminates in the distal stomach. Stomach is decompressed. Large amount of oral contrast in the ascending colon and cecum. Cecum remains dilated measuring up to 7.8 cm. Contrast has advanced into the descending colon. Again noted is fluid and inflammatory changes surrounding the descending colon. Small irregular pockets of fluid around the descending colon on image 52/4 are similar to the recent comparison examination. Lymphatic: Mildly prominent lymph nodes in  the upper abdomen at the gastrohepatic ligament. Other: Inflammatory changes in the left upper abdomen situated around the spleen, distal pancreas and descending colon. Findings are similar to the recent comparison examination. No evidence for free air within the abdomen. Musculoskeletal: No acute bone abnormality. Again noted is extensive endplate sclerosis at L5-S1. IMPRESSION: VASCULAR 1. No evidence for an aneurysm or pseudoaneurysm at the splenic hilum. This represents a change from the exam on 04/15/2024 and 04/18/2024. This may represent a thrombosed pseudoaneurysm. Consider short-term follow-up in 1-2 weeks with triple phase CT of the abdomen to confirm this finding. 2. 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 3.  Aortic Atherosclerosis (ICD10-I70.0). NON-VASCULAR 1. Persistent irregular fluid collections involving the spleen. These are most compatible subcapsular collections and could represent pseudocysts. In addition, the spleen is heterogeneous and this may represent intraparenchymal cysts or even infarcts. Findings are similar to the exam on 04/18/2024. 2. Persistent inflammatory changes and irregular fluid collections in left upper abdomen around the descending colon. Findings are suggestive for small irregular pseudocyst formations. Difficult to exclude wall thickening / colitis involving the proximal descending colon. Recommend attention to this area on follow up imaging. 3. Persistent low-density areas involving the pancreatic tail are similar to prior examinations. Findings may represent pseudocyst formations. 4. Small left pleural effusion with compressive atelectasis in left lower lobe. Electronically Signed   By: Elene Griffes M.D.   On: 04/19/2024 14:56     Assessment / Plan:   Assessment: 1.  Chronic pancreatitis  with pseudocyst: With expanding pseudocyst and fluid collection, patient initially started on IV cefepime , IV  Flagyl  and IV vancomycin , continued on [name] , pain is now returned to his chronic level of pain that he experiences at home 2.  Acute exacerbation of COPD: This seems to be improved 3.  COVID-19: On it at time of admission 4.  Suspected lung cancer 5.  GERD/gastritis  Plan: 1.  Okay for NG tube removal today. 2.  Continue other supportive measures.  Thank you for your kind consultation, we will continue to follow.    LOS: 10 days   Graciella Lavender  04/21/2024, 9:59 AM

## 2024-04-21 NOTE — Progress Notes (Signed)
 [facility]                                                                              Wesley [name], is a 58 y.o. male, DOB - November 17, 1966, ZOX:[id number] Admit date - 04/10/2024    Outpatient Primary MD for the patient is [name], DO  LOS - 10  days  Chief Complaint  Patient presents with   Shortness of Breath       Brief summary   Patient is a 58 year old male with history of suspected lung CA, GERD, COPD, chronic pancreatitis with pseudocyst presented to the hospital with cough, abdominal pain.  He was found to have COPD exacerbation in the setting of COVID infection along with acute on chronic pancreatitis complicated by pseudocyst. He was placed on IV fluids, pain control, bronchodilators, diet was advanced On 5/17, patient noted to have abdominal distention with likely ileus, no SBO or obstruction.   Assessment & Plan    Intra-abdominal fluid collections vs abscess Acute on chronic pancreatitis, intra-abdominal fluid collections, pseudocyst - Worsening leukocytosis, pain, CT abdomen on 5/19 showed perisplenic fluid collection, another fluid collection near the diaphragm, wall thickening and inflammation of the splenic flexure of the colon,  largest collection adjacent to the colon, 2.3x 4.4x 5.9 cm - Placed on n.p.o. status, IV fluids, NGT, pain control.  GI, surgery consulted. - Placed on IV Zosyn  - CTA abdomen pelvis negative for pseudoaneurysm, per IR, fluid collections are noted to thrombosed on CTA imaging and no indication for drainage.  Only true fluid collection is just adjacent to descending colon and noted to be very small.   - Symptomatically now improving - Continue IV Zosyn , [name] , GI following  Possible partial large bowel obstruction - CT abdomen showed 2.3 x 4.4 x 5.9 cm collection involving the hepatic flexure with proximal dilation and air fluid levels of the colon, patient could be partially obstructed at this level.  Patient also had  severe constipation, ileus, abdominal distention, NGT was placed -Now having BMs, improving, surgery following, NGT clamped - Started clears, plan for NGT removal today  Proximal descending colitis -Likely reactive or ischemic colitis.  Per surgery no emergent surgical needs, continue IV antibiotics - Will likely need colonoscopy once acute inflammation improves  COVID infection - COVID was positive on 04/10/2024, currently no hypoxia or shortness of breath - Continue symptomatic treatment, bronchodilators.  Prednisone  discontinued on 5/20, no hypoxia - Removed airborne isolation  COPD exacerbation, - Currently stable, no acute wheezing, continue bronchodilators  - DC'd prednisone   RUL nodule, suspected lung Ca - Patient had a PET scan, last 2 biopsies were nondiagnostic, will need to follow-up outpatient.  Has seen radiation oncology recently and was recommended repeating another CT scan in 6 months  GERD/gastritis - Continue IV PPI   intermittent blurry vision - MRI brain is negative, recommended outpatient follow-up with ophthalmology  Hyponatremia - Improving, continue IV hydration until tolerating p.o. diet  Obstipation/constipation -On stool softeners, received Relistor  on 5/19    Estimated body mass index is 26.61 kg/m as calculated from the following:   Height as of this encounter: 5\' 8"  (1.727 m).  Weight as of this encounter: 79.4 kg.  Code Status: Full code DVT Prophylaxis:  Place and maintain sequential compression device Start: 04/21/24 0558   Level of Care: Level of care: Progressive Family Communication: Updated patient Disposition Plan:      Remains inpatient appropriate:      Procedures:    Consultants:   General Surgery GI Intervention radiology  Antimicrobials:   Anti-infectives (From admission, onward)    Start     Dose/Rate Route Frequency Ordered Stop   04/19/24 1000  piperacillin -tazobactam (ZOSYN ) IVPB 3.375 g        3.375 g 12.5  mL/hr over 240 Minutes Intravenous Every 8 hours 04/19/24 0854     04/12/24 1000  cefTRIAXone  (ROCEPHIN ) 2 g in sodium chloride  0.9 % 100 mL IVPB  Status:  Discontinued        2 g 200 mL/hr over 30 Minutes Intravenous Daily 04/12/24 0758 04/19/24 0849   04/10/24 2100  metroNIDAZOLE  (FLAGYL ) IVPB 500 mg  Status:  Discontinued        500 mg 100 mL/hr over 60 Minutes Intravenous Every 12 hours 04/10/24 0840 04/19/24 0849   04/10/24 2100  vancomycin  (VANCOCIN ) IVPB 1000 mg/200 mL premix  Status:  Discontinued        1,000 mg 200 mL/hr over 60 Minutes Intravenous Every 12 hours 04/10/24 0940 04/12/24 0758   04/10/24 1600  ceFEPIme  (MAXIPIME ) 2 g in sodium chloride  0.9 % 100 mL IVPB  Status:  Discontinued        2 g 200 mL/hr over 30 Minutes Intravenous Every 8 hours 04/10/24 0940 04/12/24 0758   04/10/24 0830  metroNIDAZOLE  (FLAGYL ) IVPB 500 mg        500 mg 100 mL/hr over 60 Minutes Intravenous  Once 04/10/24 0819 04/10/24 1222   04/10/24 0800  vancomycin  (VANCOREADY) IVPB 1750 mg/350 mL        1,750 mg 175 mL/hr over 120 Minutes Intravenous  Once 04/10/24 0722 04/10/24 1120   04/10/24 0730  ceFEPIme  (MAXIPIME ) 2 g in sodium chloride  0.9 % 100 mL IVPB        2 g 200 mL/hr over 30 Minutes Intravenous  Once 04/10/24 0722 04/10/24 1430          Medications  amitriptyline   50 mg Oral QHS   cyanocobalamin   1,000 mcg Oral Daily   docusate sodium   100 mg Oral BID   fluticasone  furoate-vilanterol  1 puff Inhalation Daily   lipase/protease/amylase  72,000 Units Oral TID WC   And   lipase/protease/amylase  36,000 Units Oral With snacks   multivitamin with minerals  1 tablet Oral Daily   pantoprazole  (PROTONIX ) IV  40 mg Intravenous Q12H   pregabalin   150 mg Oral TID   umeclidinium bromide   1 puff Inhalation Daily      Subjective:   Wesley [name] was seen and examined today.  Overall improving, states had BM yesterday and today.  NGT clamped, started on clears.  No acute nausea vomiting.   Abdominal distention and pain improving.  Objective:   Vitals:   04/20/24 0859 04/20/24 1330 04/20/24 2215 04/21/24 0637  BP:  (!) 123/92 129/86 126/88  Pulse:  97 (!) 106 98  Resp:  17 18 18   Temp:  98.3 F (36.8 C) 99.2 F (37.3 C) 98.2 F (36.8 C)  TempSrc:  Oral Oral Oral  SpO2: (S) 91% 95% 94% 96%  Weight:      Height:        Intake/Output Summary (  Last 24 hours) at 04/21/2024 1147 Last data filed at 04/21/2024 0400 Gross per 24 hour  Intake 1635.58 ml  Output --  Net 1635.58 ml     Wt Readings from Last 3 Encounters:  04/10/24 79.4 kg  03/10/24 79.9 kg  12/29/23 75.8 kg    Physical Exam General: Alert and oriented x 3, NAD, NGT+ Cardiovascular: S1 S2 clear, RRR.  Respiratory: CTAB, no wheezing, rales or rhonchi Gastrointestinal: Soft, mild diffuse TTP, distention improving, +BS Ext: no pedal edema bilaterally Neuro: no new deficits Psych: Normal affect    Data Reviewed:  I have personally reviewed following labs    CBC Lab Results  Component Value Date   WBC 24.7 (H) 04/21/2024   RBC 4.21 (L) 04/21/2024   HGB 12.2 (L) 04/21/2024   HCT 37.6 (L) 04/21/2024   MCV 89.3 04/21/2024   MCH 29.0 04/21/2024   PLT 593 (H) 04/21/2024   MCHC 32.4 04/21/2024   RDW 16.7 (H) 04/21/2024   LYMPHSABS 2.2 04/18/2024   MONOABS 1.6 (H) 04/18/2024   EOSABS 0.0 04/18/2024   BASOSABS 0.0 04/18/2024     Last metabolic panel Lab Results  Component Value Date   NA 130 (L) 04/21/2024   K 3.4 (L) 04/21/2024   CL 96 (L) 04/21/2024   CO2 25 04/21/2024   BUN 10 04/21/2024   CREATININE 0.87 04/21/2024   GLUCOSE 105 (H) 04/21/2024   GFRNONAA >60 04/21/2024   GFRAA 106 10/08/2020   CALCIUM 7.9 (L) 04/21/2024   PHOS 2.7 04/21/2024   PROT 7.8 04/19/2024   ALBUMIN 2.4 (L) 04/21/2024   LABGLOB 3.9 09/17/2022   AGRATIO 0.9 (L) 09/17/2022   BILITOT 0.9 04/19/2024   ALKPHOS 79 04/19/2024   AST 21 04/19/2024   ALT 16 04/19/2024   ANIONGAP 9 04/21/2024    CBG (last  3)  No results for input(s): "GLUCAP" in the last 72 hours.    Coagulation Profile: No results for input(s): "INR", "PROTIME" in the last 168 hours.   Radiology Studies: I have personally reviewed the imaging studies  DG Abd 2 Views Result Date: 04/21/2024 CLINICAL DATA:  Follow-up ileus EXAM: ABDOMEN - 2 VIEW COMPARISON:  Abdominal radiograph dated 04/18/2024 FINDINGS: Gastric/enteric tube tip projects over the stomach. Side-hole projects over the gastric cardia. Decreased number of gas-filled bowel loops within the abdomen. Mildly dilated ascending and transverse colon. No free air or pneumatosis. No abnormal mass effect. Metallic radiodensity projects over the right hemipelvis. No acute or substantial osseous abnormality. Right upper quadrant surgical clips. Partially imaged lung bases demonstrate confluent opacity in the left lung base and blunting of the left costophrenic angle. IMPRESSION: 1. Decreased number of gas-filled bowel loops within the abdomen. Mildly dilated ascending and transverse colon remain. 2. Gastric/enteric tube tip projects over the stomach. Side-hole projects over the gastric cardia. Consider slightly advancing for more optimal positioning. 3. Confluent opacity in the left lung base and blunting of the left costophrenic angle, which may represent atelectasis, aspiration, or pneumonia with pleural effusion. Electronically Signed   By: Limin  Xu M.D.   On: 04/21/2024 11:10       Wesley [name] M.D. [facility] 04/21/2024, 11:47 AM  Available via Epic secure chat 7am-7pm After 7 pm, please refer to night coverage provider listed on amion.

## 2024-04-22 DIAGNOSIS — R140 Abdominal distension (gaseous): Secondary | ICD-10-CM | POA: Diagnosis not present

## 2024-04-22 DIAGNOSIS — G8929 Other chronic pain: Secondary | ICD-10-CM

## 2024-04-22 DIAGNOSIS — K859 Acute pancreatitis without necrosis or infection, unspecified: Secondary | ICD-10-CM | POA: Diagnosis not present

## 2024-04-22 DIAGNOSIS — K8689 Other specified diseases of pancreas: Secondary | ICD-10-CM | POA: Diagnosis not present

## 2024-04-22 DIAGNOSIS — R109 Unspecified abdominal pain: Secondary | ICD-10-CM | POA: Diagnosis not present

## 2024-04-22 LAB — CBC
HCT: 36.7 % — ABNORMAL LOW (ref 39.0–52.0)
Hemoglobin: 11.9 g/dL — ABNORMAL LOW (ref 13.0–17.0)
MCH: 29.2 pg (ref 26.0–34.0)
MCHC: 32.4 g/dL (ref 30.0–36.0)
MCV: 90 fL (ref 80.0–100.0)
Platelets: 664 10*3/uL — ABNORMAL HIGH (ref 150–400)
RBC: 4.08 MIL/uL — ABNORMAL LOW (ref 4.22–5.81)
RDW: 16.6 % — ABNORMAL HIGH (ref 11.5–15.5)
WBC: 23.7 10*3/uL — ABNORMAL HIGH (ref 4.0–10.5)
nRBC: 0 % (ref 0.0–0.2)

## 2024-04-22 LAB — RENAL FUNCTION PANEL
Albumin: 2.3 g/dL — ABNORMAL LOW (ref 3.5–5.0)
Anion gap: 10 (ref 5–15)
BUN: 8 mg/dL (ref 6–20)
CO2: 26 mmol/L (ref 22–32)
Calcium: 8.1 mg/dL — ABNORMAL LOW (ref 8.9–10.3)
Chloride: 97 mmol/L — ABNORMAL LOW (ref 98–111)
Creatinine, Ser: 0.99 mg/dL (ref 0.61–1.24)
GFR, Estimated: >60 mL/min (ref >60)
Glucose, Bld: 118 mg/dL — ABNORMAL HIGH (ref 70–99)
Phosphorus: 1.9 mg/dL — ABNORMAL LOW (ref 2.5–4.6)
Potassium: 3.2 mmol/L — ABNORMAL LOW (ref 3.5–5.1)
Sodium: 133 mmol/L — ABNORMAL LOW (ref 135–145)

## 2024-04-22 MED ORDER — K PHOS MONO-SOD PHOS DI & MONO 155-852-130 MG PO TABS
500.0000 mg | ORAL_TABLET | Freq: Three times a day (TID) | ORAL | Status: AC
  Administered 2024-04-22 (×3): 500 mg via ORAL
  Filled 2024-04-22 (×3): qty 2

## 2024-04-22 MED ORDER — POTASSIUM CHLORIDE CRYS ER 20 MEQ PO TBCR
40.0000 meq | EXTENDED_RELEASE_TABLET | Freq: Once | ORAL | Status: AC
  Administered 2024-04-22: 40 meq via ORAL
  Filled 2024-04-22: qty 2

## 2024-04-22 NOTE — TOC Progression Note (Signed)
 Transition of Care [facility]) - Progression Note    Patient Details  Name: Wesley [name] MRN: [id number] Date of Birth: 23-Apr-1966  [facility]) CM/SW Contact  Jetaime Pinnix, Thersia Flax, RN Phone Number: 04/22/2024, 3:41 PM  Clinical Narrative:d/c plan home.       Expected Discharge Plan: Home/Self Care Barriers to Discharge: Continued Medical Work up  Expected Discharge Plan and Services   Discharge Planning Services: CM Consult   Living arrangements for the past 2 months: Single Family Home                 DME Arranged: N/A DME Agency: NA       HH Arranged: NA HH Agency: NA         Social Determinants of Health (SDOH) Interventions SDOH Screenings   Food Insecurity: No Food Insecurity (04/10/2024)  Housing: Low Risk  (04/10/2024)  Transportation Needs: No Transportation Needs (04/10/2024)  Utilities: Not At Risk (04/10/2024)  Alcohol Screen: Low Risk  (04/06/2023)  Depression (PHQ2-9): Low Risk  (12/15/2023)  Financial Resource Strain: Medium Risk (04/06/2023)  Physical Activity: Insufficiently Active (04/06/2023)  Social Connections: Socially Isolated (12/15/2023)  Stress: Stress Concern Present (04/06/2023)  Tobacco Use: High Risk (04/10/2024)    Readmission Risk Interventions     No data to display

## 2024-04-22 NOTE — Progress Notes (Signed)
 Progress Note     Subjective: Pt reports continued bowel function, tolerating CLD. Overall feeling improved. Still has some L sided abdominal pain that is not affected by PO intake or BMs.   Objective: Vital signs in last 24 hours: Temp:  [98.2 F (36.8 C)-101.2 F (38.4 C)] 98.2 F (36.8 C) (05/23 0748) Pulse Rate:  [90-110] 90 (05/23 0748) Resp:  [17-20] 17 (05/23 0748) BP: (115-149)/(78-92) 118/78 (05/23 0748) SpO2:  [90 %-94 %] 92 % (05/23 0758) Weight:  [75.6 kg] 75.6 kg (05/22 1300) Last BM Date : 04/21/24  Intake/Output from previous [place]: 05/22 0701 - 05/23 0700 In: 717 [P.O.:717] Out: 900 [Urine:900] Intake/Output this shift: No intake/output data recorded.  PE: General: pleasant, WD male who is laying in bed in NAD HEENT: sclera anicteric Heart: RRR Lungs: Respiratory effort nonlabored Abd: soft, slightly less distended, mild ttp of L abdomen without peritonitis, prior surgical scars noted Psych: A&Ox3 with an appropriate affect.    Lab Results:  Recent Labs    04/21/24 0529 04/22/24 0530  WBC 24.7* 23.7*  HGB 12.2* 11.9*  HCT 37.6* 36.7*  PLT 593* 664*   BMET Recent Labs    04/21/24 0530 04/22/24 0530  NA 130* 133*  K 3.4* 3.2*  CL 96* 97*  CO2 25 26  GLUCOSE 105* 118*  BUN 10 8  CREATININE 0.87 0.99  CALCIUM 7.9* 8.1*   PT/INR No results for input(s): "LABPROT", "INR" in the last 72 hours. CMP     Component Value Date/Time   NA 133 (L) 04/22/2024 0530   NA 139 09/17/2022 1239   K 3.2 (L) 04/22/2024 0530   CL 97 (L) 04/22/2024 0530   CO2 26 04/22/2024 0530   GLUCOSE 118 (H) 04/22/2024 0530   BUN 8 04/22/2024 0530   BUN 6 09/17/2022 1239   CREATININE 0.99 04/22/2024 0530   CREATININE 0.94 07/26/2019 1210   CALCIUM 8.1 (L) 04/22/2024 0530   PROT 7.8 04/19/2024 0914   PROT 7.5 09/17/2022 1239   ALBUMIN 2.3 (L) 04/22/2024 0530   ALBUMIN 3.6 (L) 09/17/2022 1239   AST 21 04/19/2024 0914   ALT 16 04/19/2024 0914   ALT 24  05/02/2019 1617   ALKPHOS 79 04/19/2024 0914   BILITOT 0.9 04/19/2024 0914   BILITOT <0.2 09/17/2022 1239   GFRNONAA >60 04/22/2024 0530   GFRAA 106 10/08/2020 0943   Lipase     Component Value Date/Time   LIPASE 92 (H) 04/18/2024 0416       Studies/Results: DG Abd 2 Views Result Date: 04/21/2024 CLINICAL DATA:  Follow-up ileus EXAM: ABDOMEN - 2 VIEW COMPARISON:  Abdominal radiograph dated 04/18/2024 FINDINGS: Gastric/enteric tube tip projects over the stomach. Side-hole projects over the gastric cardia. Decreased number of gas-filled bowel loops within the abdomen. Mildly dilated ascending and transverse colon. No free air or pneumatosis. No abnormal mass effect. Metallic radiodensity projects over the right hemipelvis. No acute or substantial osseous abnormality. Right upper quadrant surgical clips. Partially imaged lung bases demonstrate confluent opacity in the left lung base and blunting of the left costophrenic angle. IMPRESSION: 1. Decreased number of gas-filled bowel loops within the abdomen. Mildly dilated ascending and transverse colon remain. 2. Gastric/enteric tube tip projects over the stomach. Side-hole projects over the gastric cardia. Consider slightly advancing for more optimal positioning. 3. Confluent opacity in the left lung base and blunting of the left costophrenic angle, which may represent atelectasis, aspiration, or pneumonia with pleural effusion. Electronically Signed   By: Limin  Xu M.D.   On: 04/21/2024 11:10    Anti-infectives: Anti-infectives (From admission, onward)    Start     Dose/Rate Route Frequency Ordered Stop   04/19/24 1000  piperacillin -tazobactam (ZOSYN ) IVPB 3.375 g        3.375 g 12.5 mL/hr over 240 Minutes Intravenous Every 8 hours 04/19/24 0854     04/12/24 1000  cefTRIAXone  (ROCEPHIN ) 2 g in sodium chloride  0.9 % 100 mL IVPB  Status:  Discontinued        2 g 200 mL/hr over 30 Minutes Intravenous Daily 04/12/24 0758 04/19/24 0849    04/10/24 2100  metroNIDAZOLE  (FLAGYL ) IVPB 500 mg  Status:  Discontinued        500 mg 100 mL/hr over 60 Minutes Intravenous Every 12 hours 04/10/24 0840 04/19/24 0849   04/10/24 2100  vancomycin  (VANCOCIN ) IVPB 1000 mg/200 mL premix  Status:  Discontinued        1,000 mg 200 mL/hr over 60 Minutes Intravenous Every 12 hours 04/10/24 0940 04/12/24 0758   04/10/24 1600  ceFEPIme  (MAXIPIME ) 2 g in sodium chloride  0.9 % 100 mL IVPB  Status:  Discontinued        2 g 200 mL/hr over 30 Minutes Intravenous Every 8 hours 04/10/24 0940 04/12/24 0758   04/10/24 0830  metroNIDAZOLE  (FLAGYL ) IVPB 500 mg        500 mg 100 mL/hr over 60 Minutes Intravenous  Once 04/10/24 0819 04/10/24 1222   04/10/24 0800  vancomycin  (VANCOREADY) IVPB 1750 mg/350 mL        1,750 mg 175 mL/hr over 120 Minutes Intravenous  Once 04/10/24 0722 04/10/24 1120   04/10/24 0730  ceFEPIme  (MAXIPIME ) 2 g in sodium chloride  0.9 % 100 mL IVPB        2 g 200 mL/hr over 30 Minutes Intravenous  Once 04/10/24 0722 04/10/24 1430        Assessment/Plan  58 y/o M with acute on chronic pancreatitis with peripancreatic fluid collections Pancreatic pseudoaneurysm  - IR evaluated and sees a pancreatic tail pseudoaneurysm, likely arising from splenic artery, and ordered a CTA to further evaluate. CTA shows two peripancreatic fluid collections that are felt to be thrombosed due to pseudoaneurysm and drainage/aspiration is not recommended. There are some subcapsular splenic fluid collections that they do not recommend draining. There is an additional fluid collection near the descending colon that is too small for drainage. - IV abx - Per GI   Possible partial large bowel obstruction There is a 2.3 x 4.4 x 5.9 cm collection involving the hepatic flexure with proximal dilation and air fluid levels of the colon, concern for partial obstruction. Now improved and having bowel function, NGT out yesterday. No emergent surgical needs. Now on FLD, can  consider miralax  as needed   Proximal descending colitis - favor reactive to pancreatitis/perisplenic process but cannot completely exclude diverticulitis given diverticulum on colonoscopy in 2023. No emergent surgical needs. Monitor on abx. Will need repeat colonoscopy once acute inflammation improves.  No acute surgical needs, general surgery will sign off at this time but are available as needed if acute concerns arise.    FEN - FLD, IVF per primary VTE - SCD's ID - Zosyn  5/20>>  - per TR[facility] -   COVID + COPD GERD Spinal stenosis - on lyrica   Tobacco abuse    LOS: 11 days   I reviewed Consultant GI notes, hospitalist notes, last 24 h vitals and pain scores, last 48 h intake and output, last  24 h labs and trends, and last 24 h imaging results.  This care required moderate level of medical decision making.    Annetta Killian, [facility] Surgery 04/22/2024, 10:17 AM Please see Amion for pager number during [place] hours 7:00am-4:30pm

## 2024-04-22 NOTE — Progress Notes (Signed)
  Progress Note   Patient: Wesley [name] MVH:[id number] DOB: [birth year] DOA: 04/10/2024     11 DOS: the patient was seen and examined on 04/22/2024 at 9:00AM      Brief hospital course: 58 year old M with COPD, chronic pancreatitis and pseudocyst, possible lung cancer presenting with abdominal pain, cough  Subsequent hospitalization complicated by worsening abdominal pain and ileus, general surgery and GI consulted.       Assessment and Plan: Acute on chronic pancreatitis with peripancreatic fluid collection Pancreatic pseudoaneurysm Patient having some residual pain, but improving, bowel function is resolving. -Advance diet as tolerated, to FLD today - Continue docusate - Continue analgesics - Continue [name]  - Consult GI, may possible offer celiac block, assessment pending   Descending colitis Spiked a fever to 101F last night, but overall symptoms improving, from surgery standpoint they recommend 5 days antibiotics total -Continue IV antibiotics, [place] 4 of 5  Partial bowel obstruction doubted General surgery feel like now that his bowel movements have resumed, large bowel obstruction is unlikely  COVID infection Resolved  COPD exacerbation Resolved, asymptomatic - Continue ICS/LABA/LAMA  Hypokalemia Hypophosphatemia - Supplement potassium and phosphorus    RUL nodule, suspected lung Ca - Patient had a PET scan, last 2 biopsies were nondiagnostic, will need to follow-up outpatient.  Has seen radiation oncology recently and was recommended repeating another CT scan in 6 months   GERD/gastritis - Continue IV PPI    intermittent blurry vision - MRI brain is negative, recommended outpatient follow-up with ophthalmology   Hyponatremia - Improving, continue IV hydration until tolerating p.o. diet        Subjective: Patient had a bowel movement overnight, he still having lower abdominal pain but this is improving, its mostly in the left lower quadrant.  He did  have a fever yesterday.  No vomiting.  Tolerating diet well.     Physical Exam: BP 126/83 (BP Location: Right Arm)   Pulse (!) 102   Temp 98.5 F (36.9 C) (Oral)   Resp 18   Ht 5\' 8"  (1.727 m)   Wt 75.6 kg   SpO2 98%   BMI 25.35 kg/m   Adult male, lying in bed, interactive and appropriate RRR, no murmurs, no peripheral edema Respiratory normal, lungs clear without rales or wheezes Abdomen soft mild tenderness palpation, no ascites or distention Attention normal, affect normal, judgment insight appear normal    Data Reviewed: Discussed with general surgery and GI Sodium 133, potassium 3.2 White blood cell count 23, no change Hemoglobin 11 Creatinine stable    Family Communication: None present    Disposition: Status is: Inpatient Patient able to tolerate advancing to full liquid diet today  Will need to monitor fever curve, and determine final GI plans regarding celiac block        Author: Ephriam Hashimoto, MD 04/22/2024 4:19 PM  For on call review [email or web address].

## 2024-04-22 NOTE — Progress Notes (Signed)
 Mobility Specialist - Progress Note  ( 2L) Pre-mobility: 101 bpm HR, 95% SpO2 During mobility: 113 bpm HR, 84% SpO2 Post-mobility: 105 bpm HR, 92% SPO2   04/22/24 1322  Mobility  Activity Ambulated with assistance in hallway  Level of Assistance Standby assist, set-up cues, supervision of patient - no hands on  Assistive Device None  Distance Ambulated (ft) 200 ft  Range of Motion/Exercises Active  Activity Response Tolerated fair  Mobility Referral Yes  Mobility visit 1 Mobility  Mobility Specialist Start Time (ACUTE ONLY) 1300  Mobility Specialist Stop Time (ACUTE ONLY) 1322  Mobility Specialist Time Calculation (min) (ACUTE ONLY) 22 min   Pt was found in bed and agreeable to ambulate. Grew SOB with session with SPO2 reading 84%. Took standing rest break and only increased SPO2 to 89%. Returned to bed and was left on with all needs met. Call bell in reach.  Lorna Rose,  Mobility Specialist Can be reached via Secure Chat

## 2024-04-22 NOTE — Progress Notes (Signed)
 Progress Note   Subjective  Chief Complaint: Abdominal pain and chronic pancreatitis with pseudocyst  Today, patient continues to feel better, looks much better after NG tube removed yesterday.  Tolerating his diet so far and ready to increase.  Had a bowel movement or 2 overnight.  In general is much better.   Objective   Vital signs in last 24 hours: Temp:  [98.2 F (36.8 C)-101.2 F (38.4 C)] 98.2 F (36.8 C) (05/23 0748) Pulse Rate:  [90-110] 90 (05/23 0748) Resp:  [17-20] 17 (05/23 0748) BP: (115-149)/(78-92) 118/78 (05/23 0748) SpO2:  [90 %-94 %] 92 % (05/23 0758) Weight:  [75.6 kg] 75.6 kg (05/22 1300) Last BM Date : 04/21/24 General:  AA male in NAD Heart:  Regular rate and rhythm; no murmurs Lungs: Respirations even and unlabored, lungs CTA bilaterally Abdomen:  Soft, mild epigastric TTP and nondistended. Normal bowel sounds. Psych:  Cooperative. Normal mood and affect.  Intake/Output from previous [place]: 05/22 0701 - 05/23 0700 In: 717 [P.O.:717] Out: 900 [Urine:900]  Lab Results: Recent Labs    04/20/24 0532 04/21/24 0529 04/22/24 0530  WBC 26.5* 24.7* 23.7*  HGB 13.5 12.2* 11.9*  HCT 40.0 37.6* 36.7*  PLT 577* 593* 664*   BMET Recent Labs    04/20/24 0532 04/21/24 0530 04/22/24 0530  NA 131* 130* 133*  K 3.6 3.4* 3.2*  CL 96* 96* 97*  CO2 24 25 26   GLUCOSE 133* 105* 118*  BUN 13 10 8   CREATININE 0.92 0.87 0.99  CALCIUM 8.4* 7.9* 8.1*   LFT Recent Labs    04/22/24 0530  ALBUMIN 2.3*   Studies/Results: DG Abd 2 Views Result Date: 04/21/2024 CLINICAL DATA:  Follow-up ileus EXAM: ABDOMEN - 2 VIEW COMPARISON:  Abdominal radiograph dated 04/18/2024 FINDINGS: Gastric/enteric tube tip projects over the stomach. Side-hole projects over the gastric cardia. Decreased number of gas-filled bowel loops within the abdomen. Mildly dilated ascending and transverse colon. No free air or pneumatosis. No abnormal mass effect. Metallic radiodensity projects  over the right hemipelvis. No acute or substantial osseous abnormality. Right upper quadrant surgical clips. Partially imaged lung bases demonstrate confluent opacity in the left lung base and blunting of the left costophrenic angle. IMPRESSION: 1. Decreased number of gas-filled bowel loops within the abdomen. Mildly dilated ascending and transverse colon remain. 2. Gastric/enteric tube tip projects over the stomach. Side-hole projects over the gastric cardia. Consider slightly advancing for more optimal positioning. 3. Confluent opacity in the left lung base and blunting of the left costophrenic angle, which may represent atelectasis, aspiration, or pneumonia with pleural effusion. Electronically Signed   By: Limin  Xu M.D.   On: 04/21/2024 11:10    Assessment / Plan:   Assessment: 1.  Chronic pancreatitis with pseudocyst: With expanding pseudocyst and fluid collection, patient initially started on IV Cefepime , IV Flagyl  and IV Vancomycin , continued on [name] , now returning back to normal pain level, recent CT reviewed 2.  Acute exacerbation of COPD: Improved 3.  COVID-19: On admission, now off precautions 4.  Suspected lung cancer 5.  GERD/gastritis  Plan: 1.  Agree with increasing diet to full liquids today and see how he does and then advance as tolerated. 2.  Surgical team signed off. 3.  Dr. Brice Campi is looking at everything to decipher when he may be able to offer EUS with celiac block, inpatient versus outpatient, look for his note later today  Thank you for your kind consultation.   LOS: 11 days   Bridgette Campus  Judge Notice  04/22/2024, 10:05 AM

## 2024-04-23 ENCOUNTER — Inpatient Hospital Stay (HOSPITAL_COMMUNITY)

## 2024-04-23 DIAGNOSIS — K8689 Other specified diseases of pancreas: Secondary | ICD-10-CM | POA: Diagnosis not present

## 2024-04-23 DIAGNOSIS — J441 Chronic obstructive pulmonary disease with (acute) exacerbation: Secondary | ICD-10-CM | POA: Diagnosis not present

## 2024-04-23 DIAGNOSIS — J45909 Unspecified asthma, uncomplicated: Secondary | ICD-10-CM | POA: Diagnosis not present

## 2024-04-23 DIAGNOSIS — R1032 Left lower quadrant pain: Secondary | ICD-10-CM | POA: Diagnosis not present

## 2024-04-23 LAB — RENAL FUNCTION PANEL
Albumin: 2.2 g/dL — ABNORMAL LOW (ref 3.5–5.0)
Anion gap: 9 (ref 5–15)
BUN: 6 mg/dL (ref 6–20)
CO2: 29 mmol/L (ref 22–32)
Calcium: 8.1 mg/dL — ABNORMAL LOW (ref 8.9–10.3)
Chloride: 96 mmol/L — ABNORMAL LOW (ref 98–111)
Creatinine, Ser: 0.99 mg/dL (ref 0.61–1.24)
GFR, Estimated: >60 mL/min (ref >60)
Glucose, Bld: 117 mg/dL — ABNORMAL HIGH (ref 70–99)
Phosphorus: 2.9 mg/dL (ref 2.5–4.6)
Potassium: 3.1 mmol/L — ABNORMAL LOW (ref 3.5–5.1)
Sodium: 134 mmol/L — ABNORMAL LOW (ref 135–145)

## 2024-04-23 LAB — CBC
HCT: 34.9 % — ABNORMAL LOW (ref 39.0–52.0)
Hemoglobin: 11.3 g/dL — ABNORMAL LOW (ref 13.0–17.0)
MCH: 29 pg (ref 26.0–34.0)
MCHC: 32.4 g/dL (ref 30.0–36.0)
MCV: 89.7 fL (ref 80.0–100.0)
Platelets: 709 10*3/uL — ABNORMAL HIGH (ref 150–400)
RBC: 3.89 MIL/uL — ABNORMAL LOW (ref 4.22–5.81)
RDW: 16.7 % — ABNORMAL HIGH (ref 11.5–15.5)
WBC: 23.1 10*3/uL — ABNORMAL HIGH (ref 4.0–10.5)
nRBC: 0 % (ref 0.0–0.2)

## 2024-04-23 MED ORDER — POLYETHYLENE GLYCOL 3350 17 G PO PACK
17.0000 g | PACK | Freq: Every day | ORAL | Status: DC
Start: 2024-04-23 — End: 2024-04-23

## 2024-04-23 MED ORDER — IOHEXOL 9 MG/ML PO SOLN
500.0000 mL | ORAL | Status: AC
  Administered 2024-04-23 (×2): 500 mL via ORAL

## 2024-04-23 MED ORDER — PANTOPRAZOLE SODIUM 40 MG PO TBEC
40.0000 mg | DELAYED_RELEASE_TABLET | Freq: Two times a day (BID) | ORAL | Status: DC
  Administered 2024-04-23 – 2024-05-05 (×25): 40 mg via ORAL
  Filled 2024-04-23 (×25): qty 1

## 2024-04-23 MED ORDER — LACTULOSE 10 GM/15ML PO SOLN
20.0000 g | Freq: Once | ORAL | Status: AC
  Administered 2024-04-23: 20 g via ORAL
  Filled 2024-04-23: qty 30

## 2024-04-23 MED ORDER — POTASSIUM CHLORIDE CRYS ER 20 MEQ PO TBCR
40.0000 meq | EXTENDED_RELEASE_TABLET | Freq: Once | ORAL | Status: AC
  Administered 2024-04-23: 40 meq via ORAL
  Filled 2024-04-23: qty 2

## 2024-04-23 MED ORDER — POLYETHYLENE GLYCOL 3350 17 G PO PACK
17.0000 g | PACK | Freq: Two times a day (BID) | ORAL | Status: DC
  Administered 2024-04-23 – 2024-04-24 (×2): 17 g via ORAL
  Filled 2024-04-23 (×2): qty 1

## 2024-04-23 NOTE — Progress Notes (Addendum)
 [facility]                                                                              Wesley [name], is a 58 y.o. male, DOB - Dec 05, 1965, ZOX:[id number] Admit date - 04/10/2024    Outpatient Primary MD for the patient is [name], DO  LOS - 12  days  Chief Complaint  Patient presents with   Shortness of Breath       Brief summary   Patient is a 58 year old male with history of suspected lung CA, GERD, COPD, chronic pancreatitis with pseudocyst presented to the hospital with cough, abdominal pain.  He was found to have COPD exacerbation in the setting of COVID infection along with acute on chronic pancreatitis complicated by pseudocyst. He was placed on IV fluids, pain control, bronchodilators, diet was advanced On 5/17, patient noted to have abdominal distention with likely ileus, no SBO or obstruction.   Assessment & Plan   Acute on chronic pancreatitis, intra-abdominal fluid collections, pseudocyst, pseudoaneurysm - CT abdomen on 5/19 showed perisplenic fluid collection, another fluid collection near the diaphragm, wall thickening and inflammation of the splenic flexure of the colon,  largest collection adjacent to the colon, 2.3x 4.4x 5.9 cm - NGT out, tolerating full liquids, continue IV Zosyn .  - Symptomatically improving however still complaining of significant pain in the left side - Per Dr. Brice Campi, GI will follow outpatient, consider celiac block.   Possible partial large bowel obstruction - CT abdomen showed 2.3 x 4.4 x 5.9 cm collection involving the hepatic flexure with proximal dilation and air fluid levels of the colon, patient could be partially obstructed at this level.  Patient also had severe constipation, ileus, abdominal distention, NGT was placed -Now having BMs, NGT out, tolerating diet.  Per surgery his bowel movements have resumed, large bowel obstruction is unlikely   descending colitis -Likely reactive or ischemic colitis.  -  Will likely need colonoscopy once acute inflammation improves  Continue IV Zosyn  - Complaining of left-sided flank TTP, rule out any RPH or abscess Addendum: CT reviewed, no hematoma or worsening fluid collection   COVID infection - COVID was positive on 04/10/2024, currently no hypoxia or shortness of breath -Airborne precautions removed   COPD exacerbation, - Improved, no wheezing - Continue bronchodilators  RUL nodule, suspected lung Ca - Patient had a PET scan, last 2 biopsies were nondiagnostic, will need to follow-up outpatient.  Has seen radiation oncology recently and was recommended repeating another CT scan in 6 months  GERD/gastritis - Continue PPI   intermittent blurry vision - MRI brain is negative, recommended outpatient follow-up with ophthalmology  Hyponatremia - Improving  Hypokalemia Replaced  Obstipation/constipation - Continue stool softeners, received Relistor  on 5/19  Interventions: Boost Breeze, MVI, Education  Estimated body mass index is 25.35 kg/m as calculated from the following:   Height as of this encounter: 5\' 8"  (1.727 m).   Weight as of this encounter: 75.6 kg.  Code Status: Full code DVT Prophylaxis:  Place and maintain sequential compression device Start: 04/21/24 0558   Level of Care: Level of care: Med-Surg Family Communication: Updated patient Disposition Plan:  Remains inpatient appropriate:      Procedures:    Consultants:   General Surgery GI Intervention radiology  Antimicrobials:   Anti-infectives (From admission, onward)    Start     Dose/Rate Route Frequency Ordered Stop   04/19/24 1000  piperacillin -tazobactam (ZOSYN ) IVPB 3.375 g        3.375 g 12.5 mL/hr over 240 Minutes Intravenous Every 8 hours 04/19/24 0854     04/12/24 1000  cefTRIAXone  (ROCEPHIN ) 2 g in sodium chloride  0.9 % 100 mL IVPB  Status:  Discontinued        2 g 200 mL/hr over 30 Minutes Intravenous Daily 04/12/24 0758 04/19/24 0849    04/10/24 2100  metroNIDAZOLE  (FLAGYL ) IVPB 500 mg  Status:  Discontinued        500 mg 100 mL/hr over 60 Minutes Intravenous Every 12 hours 04/10/24 0840 04/19/24 0849   04/10/24 2100  vancomycin  (VANCOCIN ) IVPB 1000 mg/200 mL premix  Status:  Discontinued        1,000 mg 200 mL/hr over 60 Minutes Intravenous Every 12 hours 04/10/24 0940 04/12/24 0758   04/10/24 1600  ceFEPIme  (MAXIPIME ) 2 g in sodium chloride  0.9 % 100 mL IVPB  Status:  Discontinued        2 g 200 mL/hr over 30 Minutes Intravenous Every 8 hours 04/10/24 0940 04/12/24 0758   04/10/24 0830  metroNIDAZOLE  (FLAGYL ) IVPB 500 mg        500 mg 100 mL/hr over 60 Minutes Intravenous  Once 04/10/24 0819 04/10/24 1222   04/10/24 0800  vancomycin  (VANCOREADY) IVPB 1750 mg/350 mL        1,750 mg 175 mL/hr over 120 Minutes Intravenous  Once 04/10/24 0722 04/10/24 1120   04/10/24 0730  ceFEPIme  (MAXIPIME ) 2 g in sodium chloride  0.9 % 100 mL IVPB        2 g 200 mL/hr over 30 Minutes Intravenous  Once 04/10/24 0722 04/10/24 1430          Medications  amitriptyline   50 mg Oral QHS   B-complex with vitamin C  1 tablet Oral Daily   cyanocobalamin   1,000 mcg Oral Daily   docusate sodium   100 mg Oral BID   feeding supplement  1 Container Oral TID BM   fluticasone  furoate-vilanterol  1 puff Inhalation Daily   iohexol   500 mL Oral Q1H   lipase/protease/amylase  72,000 Units Oral TID WC   And   lipase/protease/amylase  36,000 Units Oral With snacks   multivitamin with minerals  1 tablet Oral Daily   pantoprazole   40 mg Oral BID   pregabalin   150 mg Oral TID   umeclidinium bromide   1 puff Inhalation Daily      Subjective:   Wesley [name] was seen and examined today.  Slowly improving, tolerating full liquid diet, complaining of left-sided flank pain.  No acute nausea vomiting, fevers. Objective:   Vitals:   04/22/24 1935 04/22/24 2329 04/23/24 0428 04/23/24 0749  BP: (!) 121/92 111/87 125/75   Pulse: (!) 109 85 (!) 103    Resp: 20 20 20    Temp: (!) 101.3 F (38.5 C) 97.9 F (36.6 C) 98.3 F (36.8 C)   TempSrc: Oral Oral Oral   SpO2: 94% 100% 93% 93%  Weight:      Height:        Intake/Output Summary (Last 24 hours) at 04/23/2024 1112 Last data filed at 04/22/2024 2222 Gross per 24 hour  Intake 577 ml  Output --  Net  577 ml     Wt Readings from Last 3 Encounters:  04/21/24 75.6 kg  03/10/24 79.9 kg  12/29/23 75.8 kg    Physical Exam General: Alert and oriented x 3, NAD Cardiovascular: S1 S2 clear, RRR.  Respiratory: CTAB, no wheezing, rales or rhonchi Gastrointestinal: Soft, nontender, nondistended, NBS, left-sided flank TTP to touch Ext: no pedal edema bilaterally Neuro: no new deficits Psych: Normal affect   Data Reviewed:  I have personally reviewed following labs    CBC Lab Results  Component Value Date   WBC 23.1 (H) 04/23/2024   RBC 3.89 (L) 04/23/2024   HGB 11.3 (L) 04/23/2024   HCT 34.9 (L) 04/23/2024   MCV 89.7 04/23/2024   MCH 29.0 04/23/2024   PLT 709 (H) 04/23/2024   MCHC 32.4 04/23/2024   RDW 16.7 (H) 04/23/2024   LYMPHSABS 2.2 04/18/2024   MONOABS 1.6 (H) 04/18/2024   EOSABS 0.0 04/18/2024   BASOSABS 0.0 04/18/2024     Last metabolic panel Lab Results  Component Value Date   NA 134 (L) 04/23/2024   K 3.1 (L) 04/23/2024   CL 96 (L) 04/23/2024   CO2 29 04/23/2024   BUN 6 04/23/2024   CREATININE 0.99 04/23/2024   GLUCOSE 117 (H) 04/23/2024   GFRNONAA >60 04/23/2024   GFRAA 106 10/08/2020   CALCIUM 8.1 (L) 04/23/2024   PHOS 2.9 04/23/2024   PROT 7.8 04/19/2024   ALBUMIN 2.2 (L) 04/23/2024   LABGLOB 3.9 09/17/2022   AGRATIO 0.9 (L) 09/17/2022   BILITOT 0.9 04/19/2024   ALKPHOS 79 04/19/2024   AST 21 04/19/2024   ALT 16 04/19/2024   ANIONGAP 9 04/23/2024    CBG (last 3)  No results for input(s): "GLUCAP" in the last 72 hours.    Coagulation Profile: No results for input(s): "INR", "PROTIME" in the last 168 hours.   Radiology Studies: I  have personally reviewed the imaging studies  No results found.      Bertram Brocks M.D. [facility] 04/23/2024, 11:12 AM  Available via Epic secure chat 7am-7pm After 7 pm, please refer to night coverage provider listed on amion.

## 2024-04-23 NOTE — Plan of Care (Incomplete)
  Problem: Coping: Goal: Psychosocial and spiritual needs will be supported Outcome: Progressing   Problem: Respiratory: Goal: Will maintain a patent airway Outcome: Progressing Goal: Complications related to the disease process, condition or treatment will be avoided or minimized Outcome: Progressing   Problem: Education: Goal: Knowledge of General Education information will improve Description: Including pain rating scale, medication(s)/side effects and non-pharmacologic comfort measures Outcome: Progressing   Problem: Clinical Measurements: Goal: Will remain free from infection Outcome: Progressing Goal: Diagnostic test results will improve Outcome: Progressing Goal: Respiratory complications will improve Outcome: Progressing Goal: Cardiovascular complication will be avoided Outcome: Progressing   Problem: Activity: Goal: Risk for activity intolerance will decrease Outcome: Progressing   Problem: Nutrition: Goal: Adequate nutrition will be maintained Outcome: Progressing   Problem: Coping: Goal: Level of anxiety will decrease Outcome: Progressing   Problem: Education: Goal: Knowledge of risk factors and measures for prevention of condition will improve Outcome: Adequate for Discharge

## 2024-04-23 NOTE — Progress Notes (Signed)
 Mobility Specialist - Progress Note  (RA) Pre-mobility: 99 bpm, 84% SPO2 ([place] 2L) Pre-mobility: 98 bpm HR, 93% SpO2 During mobility: 113 bpm HR, 85% SpO2 Post-mobility: 103 bpm HR, 91% SPO2   04/23/24 1013  Mobility  Activity Ambulated with assistance in hallway  Level of Assistance Standby assist, set-up cues, supervision of patient - no hands on  Assistive Device Other (Comment) (IV Pole)  Distance Ambulated (ft) 50 ft  Range of Motion/Exercises Active  Activity Response Tolerated well  Mobility Referral Yes  Mobility visit 1 Mobility  Mobility Specialist Start Time (ACUTE ONLY) 1000  Mobility Specialist Stop Time (ACUTE ONLY) 1013  Mobility Specialist Time Calculation (min) (ACUTE ONLY) 13 min   Pt was found in bed and agreeable to ambulate. Had x1 seated rest break due to decreased SPO2 88%. Able to increase within 1 min >90%. Returned to bed with all needs met. Call bell in reach.   Lorna Rose,  Mobility Specialist Can be reached via Secure Chat

## 2024-04-24 DIAGNOSIS — E440 Moderate protein-calorie malnutrition: Secondary | ICD-10-CM | POA: Diagnosis not present

## 2024-04-24 DIAGNOSIS — K859 Acute pancreatitis without necrosis or infection, unspecified: Secondary | ICD-10-CM | POA: Diagnosis not present

## 2024-04-24 DIAGNOSIS — R1032 Left lower quadrant pain: Secondary | ICD-10-CM | POA: Diagnosis not present

## 2024-04-24 DIAGNOSIS — R140 Abdominal distension (gaseous): Secondary | ICD-10-CM | POA: Diagnosis not present

## 2024-04-24 DIAGNOSIS — R933 Abnormal findings on diagnostic imaging of other parts of digestive tract: Secondary | ICD-10-CM | POA: Diagnosis not present

## 2024-04-24 DIAGNOSIS — K8689 Other specified diseases of pancreas: Secondary | ICD-10-CM | POA: Diagnosis not present

## 2024-04-24 DIAGNOSIS — E876 Hypokalemia: Secondary | ICD-10-CM

## 2024-04-24 DIAGNOSIS — J441 Chronic obstructive pulmonary disease with (acute) exacerbation: Secondary | ICD-10-CM | POA: Diagnosis not present

## 2024-04-24 LAB — CULTURE, BLOOD (ROUTINE X 2)
Culture: NO GROWTH
Culture: NO GROWTH
Special Requests: ADEQUATE

## 2024-04-24 LAB — PHOSPHORUS: Phosphorus: 2.6 mg/dL (ref 2.5–4.6)

## 2024-04-24 LAB — PROCALCITONIN: Procalcitonin: 0.78 ng/mL

## 2024-04-24 LAB — MAGNESIUM: Magnesium: 2.5 mg/dL — ABNORMAL HIGH (ref 1.7–2.4)

## 2024-04-24 MED ORDER — MIDODRINE HCL 5 MG PO TABS
10.0000 mg | ORAL_TABLET | Freq: Three times a day (TID) | ORAL | Status: DC
  Administered 2024-04-24 – 2024-04-27 (×8): 10 mg via ORAL
  Filled 2024-04-24 (×8): qty 2

## 2024-04-24 MED ORDER — POLYETHYLENE GLYCOL 3350 17 G PO PACK
17.0000 g | PACK | Freq: Every day | ORAL | Status: DC
  Administered 2024-04-26: 17 g via ORAL
  Filled 2024-04-24: qty 1

## 2024-04-24 MED ORDER — SMOG ENEMA
960.0000 mL | Freq: Once | RECTAL | Status: AC
  Administered 2024-04-24: 960 mL via RECTAL
  Filled 2024-04-24: qty 960

## 2024-04-24 MED ORDER — ALBUMIN HUMAN 25 % IV SOLN
25.0000 g | Freq: Four times a day (QID) | INTRAVENOUS | Status: AC
  Administered 2024-04-24 – 2024-04-25 (×3): 25 g via INTRAVENOUS
  Filled 2024-04-24 (×4): qty 100

## 2024-04-24 MED ORDER — KCL IN DEXTROSE-NACL 40-5-0.45 MEQ/L-%-% IV SOLN
INTRAVENOUS | Status: AC
  Filled 2024-04-24 (×2): qty 1000

## 2024-04-24 MED ORDER — METHYLNALTREXONE BROMIDE 12 MG/0.6ML ~~LOC~~ SOLN
12.0000 mg | Freq: Once | SUBCUTANEOUS | Status: DC
Start: 2024-04-24 — End: 2024-04-24

## 2024-04-24 MED ORDER — SODIUM CHLORIDE 0.9 % IV BOLUS
1000.0000 mL | Freq: Once | INTRAVENOUS | Status: AC
  Administered 2024-04-24: 1000 mL via INTRAVENOUS

## 2024-04-24 NOTE — Progress Notes (Addendum)
 [facility]                                                                              Wesley [name], is a 58 y.o. male, DOB - 1966/10/04, ZOX:[id number] Admit date - 04/10/2024    Outpatient Primary MD for the patient is [name], DO  LOS - 13  days  Chief Complaint  Patient presents with   Shortness of Breath       Brief summary   Patient is a 58 year old male with history of suspected lung CA, GERD, COPD, chronic pancreatitis with pseudocyst presented to the hospital with cough, abdominal pain.  He was found to have COPD exacerbation in the setting of COVID infection along with acute on chronic pancreatitis complicated by pseudocyst. He was placed on IV fluids, pain control, bronchodilators, diet was advanced On 5/17, patient noted to have abdominal distention with likely ileus, no SBO or obstruction.   Assessment & Plan   Acute on chronic pancreatitis, intra-abdominal fluid collections, pseudocyst, pseudoaneurysm - CT abdomen on 5/19 showed perisplenic fluid collection, another fluid collection near the diaphragm, wall thickening and inflammation of the splenic flexure of the colon,  largest collection adjacent to the colon, 2.3x 4.4x 5.9 cm - NGT out, tolerating full liquids, continue IV Zosyn .  - Symptomatically improving however still complaining of significant pain in the left side - Per Dr. Brice Campi, GI will follow outpatient, consider celiac block.   Possible partial large bowel obstruction - CT abdomen showed 2.3 x 4.4 x 5.9 cm collection involving the hepatic flexure with proximal dilation and air fluid levels of the colon, patient could be partially obstructed at this level.  Patient also had severe constipation, ileus, abdominal distention, NGT was placed - NGT out once patient started tolerating diet and having BMs, Per surgery his bowel movements have resumed, large bowel obstruction is unlikely - No significant BMs in the last 2 days, now  abdomen distended, complaining of worsening pain.  CT abdomen yesterday showed developing BO at the cecum level, dilated over 8 cm, increasing more proximal colonic dilatation - Smog enema x 1, patient had been refusing enema.  Counseled patient. - If no significant effect with smog enema, will add Relistor .  Continue bowel regimen, will request GI evaluation  descending colitis -Likely reactive or ischemic colitis.  - Will likely need colonoscopy once acute inflammation improves - Continue IV Zosyn    COVID infection - COVID was positive on 04/10/2024, currently no hypoxia or shortness of breath -Airborne precautions removed   COPD exacerbation, - Improved, no wheezing - Continue bronchodilators  RUL nodule, suspected lung Ca - Patient had a PET scan, last 2 biopsies were nondiagnostic, will need to follow-up outpatient.  Has seen radiation oncology recently and was recommended repeating another CT scan in 6 months  GERD/gastritis - Continue PPI   intermittent blurry vision - MRI brain is negative, recommended outpatient follow-up with ophthalmology  Hyponatremia - Improving  Hypokalemia Replace as needed  Obstipation/constipation - Continue stool softeners, received Relistor  on 5/19  Interventions: Boost Breeze, MVI, Education  Estimated body mass index is 25.35 kg/m as calculated from the following:  Height as of this encounter: 5\' 8"  (1.727 m).   Weight as of this encounter: 75.6 kg.  Code Status: Full code DVT Prophylaxis:  Place and maintain sequential compression device Start: 04/21/24 0558   Level of Care: Level of care: Med-Surg Family Communication: Updated patient Disposition Plan:      Remains inpatient appropriate:      Procedures:    Consultants:   General Surgery GI Intervention radiology  Antimicrobials:   Anti-infectives (From admission, onward)    Start     Dose/Rate Route Frequency Ordered Stop   04/19/24 1000   piperacillin -tazobactam (ZOSYN ) IVPB 3.375 g        3.375 g 12.5 mL/hr over 240 Minutes Intravenous Every 8 hours 04/19/24 0854     04/12/24 1000  cefTRIAXone  (ROCEPHIN ) 2 g in sodium chloride  0.9 % 100 mL IVPB  Status:  Discontinued        2 g 200 mL/hr over 30 Minutes Intravenous Daily 04/12/24 0758 04/19/24 0849   04/10/24 2100  metroNIDAZOLE  (FLAGYL ) IVPB 500 mg  Status:  Discontinued        500 mg 100 mL/hr over 60 Minutes Intravenous Every 12 hours 04/10/24 0840 04/19/24 0849   04/10/24 2100  vancomycin  (VANCOCIN ) IVPB 1000 mg/200 mL premix  Status:  Discontinued        1,000 mg 200 mL/hr over 60 Minutes Intravenous Every 12 hours 04/10/24 0940 04/12/24 0758   04/10/24 1600  ceFEPIme  (MAXIPIME ) 2 g in sodium chloride  0.9 % 100 mL IVPB  Status:  Discontinued        2 g 200 mL/hr over 30 Minutes Intravenous Every 8 hours 04/10/24 0940 04/12/24 0758   04/10/24 0830  metroNIDAZOLE  (FLAGYL ) IVPB 500 mg        500 mg 100 mL/hr over 60 Minutes Intravenous  Once 04/10/24 0819 04/10/24 1222   04/10/24 0800  vancomycin  (VANCOREADY) IVPB 1750 mg/350 mL        1,750 mg 175 mL/hr over 120 Minutes Intravenous  Once 04/10/24 0722 04/10/24 1120   04/10/24 0730  ceFEPIme  (MAXIPIME ) 2 g in sodium chloride  0.9 % 100 mL IVPB        2 g 200 mL/hr over 30 Minutes Intravenous  Once 04/10/24 0722 04/10/24 1430          Medications  amitriptyline   50 mg Oral QHS   B-complex with vitamin C  1 tablet Oral Daily   cyanocobalamin   1,000 mcg Oral Daily   docusate sodium   100 mg Oral BID   feeding supplement  1 Container Oral TID BM   fluticasone  furoate-vilanterol  1 puff Inhalation Daily   lipase/protease/amylase  72,000 Units Oral TID WC   And   lipase/protease/amylase  36,000 Units Oral With snacks   multivitamin with minerals  1 tablet Oral Daily   pantoprazole   40 mg Oral BID   polyethylene glycol  17 g Oral BID   pregabalin   150 mg Oral TID   umeclidinium bromide   1 puff Inhalation Daily       Subjective:   Wesley [name] was seen and examined today.  Abdomen again distended, having pain, refusing smog enema, no BMs in the last 2 days.  Very small BM this morning.    Objective:   Vitals:   04/23/24 2220 04/24/24 0146 04/24/24 0515 04/24/24 0740  BP:  122/84 134/88   Pulse: 91 78 82   Resp:  20 20   Temp: 99.5 F (37.5 C) 98.1 F (36.7 C) (!)  97.5 F (36.4 C)   TempSrc: Oral Oral Oral   SpO2: 98% 96% 97% 91%  Weight:      Height:        Intake/Output Summary (Last 24 hours) at 04/24/2024 1057 Last data filed at 04/24/2024 0525 Gross per 24 hour  Intake 720 ml  Output --  Net 720 ml     Wt Readings from Last 3 Encounters:  04/21/24 75.6 kg  03/10/24 79.9 kg  12/29/23 75.8 kg   Physical Exam General: Alert and oriented, uncomfortable Cardiovascular: S1 S2 clear, RRR.  Respiratory: CTAB, no wheezing, rales or rhonchi Gastrointestinal: Distended, hypoactive bowel sounds, minimal diffuse TTP Ext: no pedal edema bilaterally Neuro: no new deficits Psych: uncomfortable otherwise normal affect  Data Reviewed:  I have personally reviewed following labs    CBC Lab Results  Component Value Date   WBC 23.1 (H) 04/23/2024   RBC 3.89 (L) 04/23/2024   HGB 11.3 (L) 04/23/2024   HCT 34.9 (L) 04/23/2024   MCV 89.7 04/23/2024   MCH 29.0 04/23/2024   PLT 709 (H) 04/23/2024   MCHC 32.4 04/23/2024   RDW 16.7 (H) 04/23/2024   LYMPHSABS 2.2 04/18/2024   MONOABS 1.6 (H) 04/18/2024   EOSABS 0.0 04/18/2024   BASOSABS 0.0 04/18/2024     Last metabolic panel Lab Results  Component Value Date   NA 134 (L) 04/23/2024   K 3.1 (L) 04/23/2024   CL 96 (L) 04/23/2024   CO2 29 04/23/2024   BUN 6 04/23/2024   CREATININE 0.99 04/23/2024   GLUCOSE 117 (H) 04/23/2024   GFRNONAA >60 04/23/2024   GFRAA 106 10/08/2020   CALCIUM 8.1 (L) 04/23/2024   PHOS 2.9 04/23/2024   PROT 7.8 04/19/2024   ALBUMIN 2.2 (L) 04/23/2024   LABGLOB 3.9 09/17/2022   AGRATIO 0.9 (L)  09/17/2022   BILITOT 0.9 04/19/2024   ALKPHOS 79 04/19/2024   AST 21 04/19/2024   ALT 16 04/19/2024   ANIONGAP 9 04/23/2024    CBG (last 3)  No results for input(s): "GLUCAP" in the last 72 hours.    Coagulation Profile: No results for input(s): "INR", "PROTIME" in the last 168 hours.   Radiology Studies: I have personally reviewed the imaging studies  CT ABDOMEN PELVIS WO CONTRAST Result Date: 04/23/2024 CLINICAL DATA:  Left flank tenderness.  Abdominal pain EXAM: CT ABDOMEN AND PELVIS WITHOUT CONTRAST TECHNIQUE: Multidetector CT imaging of the abdomen and pelvis was performed following the standard protocol without IV contrast. RADIATION DOSE REDUCTION: This exam was performed according to the departmental dose-optimization program which includes automated exposure control, adjustment of the mA and/or kV according to patient size and/or use of iterative reconstruction technique. COMPARISON:  CT angiogram abdomen 04/19/2024. Contrast abdomen pelvis CT 04/18/2024. FINDINGS: Lower chest: Persistent left lower lobe consolidative opacity with small left effusion. Atelectasis versus infiltrate. Recommend continued follow-up. There is some bandlike changes at the right lung base which have slightly increased from previous. Favor more atelectatic change. This includes the right lower lobe and middle lobe. Some linear changes in the lingula also seen. Small pericardial effusion. Prominent pre cardiophrenic nodes are stable. Coronary artery calcifications are seen. Please correlate for other coronary risk factors. Hepatobiliary: Previous cholecystectomy. Once again there is some low-attenuation foci scattered in the liver which have the appearance of benign cystic foci. Unchanged from previous. Pancreas: Punctate calcifications in the uncinate process consistent with chronic calcific pancreatitis. The complex cystic focus towards the tail is better seen on the prior study  with contrast but today measures  up to 2.7 x 1.7 cm. The fluid collections extending in the left upper quadrant with from the tail of the pancreas are again identified and similar in appearance to previous examination. Density these areas approach up to 13 Hounsfield units. Example size includes focus on image 29 measuring 5.7 x 4.7 cm today and previously this same area 5.6 x 4.0 cm, minimally increased. Collection more caudal on image 40 measures 5.5 by 3.6 cm and previously with smaller at 4.3 x 1.7 cm. Some of these are in close proximity to the splenic flexure of the colon. Also extension of the splenic hilum. Spleen: Once again there are several fluid collections along the margin of the spleen including subcapsular. Again these have Hounsfield units approaching less than 15. The spleen itself is nonenlarged. No increasing size of these collections overall. Focus inferolateral on image 29 today measures 5.5 by 2.9 cm and previously 5.8 by 2.6 cm. Similar. With the appearance of the spleen on the splenic infarcts are not excluded. Adrenals/Urinary Tract: No abnormal calcifications seen within either kidney nor along the course of either ureter. Preserved contour to the bladder. Adrenal glands are preserved. Stomach/Bowel: Oral contrast was administered. Stomach is underdistended. Small bowel is nondilated. There are a few loops of unopacified small bowel in the left mid abdomen. Large bowel is dilated proximally. The ascending colon as a diameter uptake 8.1 cm, mildly dilated. Contrast fluid levels are seen in the ascending and transverse colon. There is an abrupt caliber change at the splenic flexure as described above with a relatively long segment of wall thickening along the descending colon with increasing stranding compared to previous examination. Please correlate for an area of developing colitis or other process. Sigmoid colon diverticular seen. Vascular/Lymphatic: Diffuse vascular calcifications identified along the aorta and branch  vessels. Normal caliber aorta and IVC. No developing new lymph node enlargement identified in the abdomen and pelvis on this noncontrast examination. Reproductive: Prostate is unremarkable. Other: Increasing left hemi abdominal stranding. There is thickening along the tissues surround the descending colon including the lateral conal fascia. Left-sided perinephric stranding. No frank ascites. No definite free air. Musculoskeletal: Scattered degenerative changes identified greatest at L5-S1. There is also disc bulging at multiple lumbar levels. Please correlate for any significant stenosis. IMPRESSION: Slight increase in size of low-density fluid collection left upper quadrant involving tail the pancreas as well as the surrounding tissues extending to the splenic flexure of the colon. Collections are still relatively low in density. No clear signs of acute hematoma. No associated developing air in the collections. In principle infection is difficult to exclude on the basis of CT alone. The collections extending along the spleen itself including subcapsular are similar to previous examination. The heterogeneous of the spleen could also represent a component of splenic infarcts versus just subcapsular fluid collections. The fluid collection within the tail the pancreas self appears similar to previous. There is increasing wall thickening and stranding along the proximal descending colon extending from the splenic flexure. There is also increasing more proximal colonic dilatation. Cecum has a diameter of over 8 cm. Please correlate for a component of partial obstruction. Please correlate with infectious, ischemic or inflammatory process of the colon. No small bowel dilatation. No widespread free air or ascites. Persistent pleural effusion at the left lung base with adjacent parenchymal opacities. The opacity the right lung base is slightly increasing. Again atelectatic versus infectious is possible. Recommend follow-up.  Coronary artery calcifications. Please correlate  for other coronary risk factors. Electronically Signed   By: Adrianna Horde M.D.   On: 04/23/2024 15:04        Emmerson Taddei M.D. [facility] 04/24/2024, 10:57 AM  Available via Epic secure chat 7am-7pm After 7 pm, please refer to night coverage provider listed on amion.

## 2024-04-24 NOTE — Progress Notes (Addendum)
 Patient ID: Wesley [name], male   DOB: 12-22-1965, 58 y.o.   MRN: [id number]    Progress Note   Subjective   Kulakowski# 14 CC; acute on chronic pancreatitis, recurrent pseudocyst progressive abdominal pain and distention  IV Zosyn   No new labs today CT abdomen pelvis yesterday-increase in size of fluid collection in the left upper quadrant involving the tail of the pancreas as well as the surrounding tissues extending into the splenic flexure of the colon with associated air, collections extending along the spleen itself also includes subcapsular involvement.  There is increasing wall thickening and stranding along the proximal descending colon extending from the splenic flexure and increasing more proximal colonic dilation, cecum diameter of over 8 cm Left pleural effusion  No complaints of nausea or vomiting, able to sip on fluids but cannot consume an entire tray, mostly has just been sipping on liquids ever since admission Enema today, patient says good results with that Continues to have a lot of abdominal pain and feels that his abdomen is distended.     Objective   Vital signs in last 24 hours: Temp:  [97.5 F (36.4 C)-101.2 F (38.4 C)] 100.6 F (38.1 C) (05/25 1408) Pulse Rate:  [78-139] 117 (05/25 1408) Resp:  [18-22] 20 (05/25 1408) BP: (95-141)/(58-92) 114/69 (05/25 1408) SpO2:  [91 %-98 %] 93 % (05/25 1408) Last BM Date : 04/24/24 General:    Older African-American male in NAD, uncomfortable appearing Heart: tachy  Regular rate and rhythm; no murmurs Lungs: Respirations even and unlabored, lungs CTA bilaterally Abdomen: Full feeling, bowel sounds are present, some gurgling in the right abdomen diffusely tender right greater than left Extremities:  Without edema. Neurologic:  Alert and oriented,  grossly normal neurologically. Psych:  Cooperative. Normal mood and affect.  Intake/Output from previous [place]: 05/24 0701 - 05/25 0700 In: 720 [P.O.:720] Out: -  Intake/Output  this shift: No intake/output data recorded.  Lab Results: Recent Labs    04/22/24 0530 04/23/24 0607  WBC 23.7* 23.1*  HGB 11.9* 11.3*  HCT 36.7* 34.9*  PLT 664* 709*   BMET Recent Labs    04/22/24 0530 04/23/24 0607  NA 133* 134*  K 3.2* 3.1*  CL 97* 96*  CO2 26 29  GLUCOSE 118* 117*  BUN 8 6  CREATININE 0.99 0.99  CALCIUM 8.1* 8.1*   LFT Recent Labs    04/23/24 0607  ALBUMIN 2.2*   PT/INR No results for input(s): "LABPROT", "INR" in the last 72 hours.  Studies/Results: CT ABDOMEN PELVIS WO CONTRAST Result Date: 04/23/2024 CLINICAL DATA:  Left flank tenderness.  Abdominal pain EXAM: CT ABDOMEN AND PELVIS WITHOUT CONTRAST TECHNIQUE: Multidetector CT imaging of the abdomen and pelvis was performed following the standard protocol without IV contrast. RADIATION DOSE REDUCTION: This exam was performed according to the departmental dose-optimization program which includes automated exposure control, adjustment of the mA and/or kV according to patient size and/or use of iterative reconstruction technique. COMPARISON:  CT angiogram abdomen 04/19/2024. Contrast abdomen pelvis CT 04/18/2024. FINDINGS: Lower chest: Persistent left lower lobe consolidative opacity with small left effusion. Atelectasis versus infiltrate. Recommend continued follow-up. There is some bandlike changes at the right lung base which have slightly increased from previous. Favor more atelectatic change. This includes the right lower lobe and middle lobe. Some linear changes in the lingula also seen. Small pericardial effusion. Prominent pre cardiophrenic nodes are stable. Coronary artery calcifications are seen. Please correlate for other coronary risk factors. Hepatobiliary: Previous cholecystectomy. Once again  there is some low-attenuation foci scattered in the liver which have the appearance of benign cystic foci. Unchanged from previous. Pancreas: Punctate calcifications in the uncinate process consistent with  chronic calcific pancreatitis. The complex cystic focus towards the tail is better seen on the prior study with contrast but today measures up to 2.7 x 1.7 cm. The fluid collections extending in the left upper quadrant with from the tail of the pancreas are again identified and similar in appearance to previous examination. Density these areas approach up to 13 Hounsfield units. Example size includes focus on image 29 measuring 5.7 x 4.7 cm today and previously this same area 5.6 x 4.0 cm, minimally increased. Collection more caudal on image 40 measures 5.5 by 3.6 cm and previously with smaller at 4.3 x 1.7 cm. Some of these are in close proximity to the splenic flexure of the colon. Also extension of the splenic hilum. Spleen: Once again there are several fluid collections along the margin of the spleen including subcapsular. Again these have Hounsfield units approaching less than 15. The spleen itself is nonenlarged. No increasing size of these collections overall. Focus inferolateral on image 29 today measures 5.5 by 2.9 cm and previously 5.8 by 2.6 cm. Similar. With the appearance of the spleen on the splenic infarcts are not excluded. Adrenals/Urinary Tract: No abnormal calcifications seen within either kidney nor along the course of either ureter. Preserved contour to the bladder. Adrenal glands are preserved. Stomach/Bowel: Oral contrast was administered. Stomach is underdistended. Small bowel is nondilated. There are a few loops of unopacified small bowel in the left mid abdomen. Large bowel is dilated proximally. The ascending colon as a diameter uptake 8.1 cm, mildly dilated. Contrast fluid levels are seen in the ascending and transverse colon. There is an abrupt caliber change at the splenic flexure as described above with a relatively long segment of wall thickening along the descending colon with increasing stranding compared to previous examination. Please correlate for an area of developing colitis or  other process. Sigmoid colon diverticular seen. Vascular/Lymphatic: Diffuse vascular calcifications identified along the aorta and branch vessels. Normal caliber aorta and IVC. No developing new lymph node enlargement identified in the abdomen and pelvis on this noncontrast examination. Reproductive: Prostate is unremarkable. Other: Increasing left hemi abdominal stranding. There is thickening along the tissues surround the descending colon including the lateral conal fascia. Left-sided perinephric stranding. No frank ascites. No definite free air. Musculoskeletal: Scattered degenerative changes identified greatest at L5-S1. There is also disc bulging at multiple lumbar levels. Please correlate for any significant stenosis. IMPRESSION: Slight increase in size of low-density fluid collection left upper quadrant involving tail the pancreas as well as the surrounding tissues extending to the splenic flexure of the colon. Collections are still relatively low in density. No clear signs of acute hematoma. No associated developing air in the collections. In principle infection is difficult to exclude on the basis of CT alone. The collections extending along the spleen itself including subcapsular are similar to previous examination. The heterogeneous of the spleen could also represent a component of splenic infarcts versus just subcapsular fluid collections. The fluid collection within the tail the pancreas self appears similar to previous. There is increasing wall thickening and stranding along the proximal descending colon extending from the splenic flexure. There is also increasing more proximal colonic dilatation. Cecum has a diameter of over 8 cm. Please correlate for a component of partial obstruction. Please correlate with infectious, ischemic or inflammatory process of the  colon. No small bowel dilatation. No widespread free air or ascites. Persistent pleural effusion at the left lung base with adjacent parenchymal  opacities. The opacity the right lung base is slightly increasing. Again atelectatic versus infectious is possible. Recommend follow-up. Coronary artery calcifications. Please correlate for other coronary risk factors. Electronically Signed   By: Adrianna Horde M.D.   On: 04/23/2024 15:04       Assessment / Plan:    #87 58 year old male with acute on chronic pancreatitis, admitted 2 weeks ago with worsening abdominal pain.  Also at that time with COPD exacerbation and COVID infection.  Seen by GI earlier this week on 04/19/2024 after CT angio of the abdomen pelvis was done to rule out a pseudoaneurysm at the splenic hilum.  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 He was felt to have made some improvement by 04/22/2024, and initial plan was to repeat a CT scan in about 4 weeks, and if pancreatic dilation was persisting then to proceed with endoscopic ultrasound.  In the interim patient developed some worsening of abdominal pain, and distention and repeat CT was done with findings as outlined above with slight increase in the fluid collection in the left upper quadrant involving the tail of the pancreas and surrounding tissues extending down into the splenic flexure of the colon, also subcapsular splenic fluid and fluid tracking along the left colon with increased wall thickening and stranding of of the proximal descending colon.  His cecum has become more dilated now up to 8 cm and there is concern for partial obstruction.   The "colitis" and inflammatory stranding around the proximal colon is all secondary to his pancreatitis  and associated fluid collections which are tracking down around the colon on the left side.  #2 hypokalemia-needs corrected to help with bowel function #3 nutrition-patient has not had good p.o. intake since he was admitted, need to consider alternative forms of nutrition i.e. postpyloric feeding or TPN  Plan; continue sips of clears Would get cor track placed and start postpyloric feedings There is no easy solution to the inflammatory changes along the left colon which is being compressed by peripancreatic fluid collections  Will see how he does over the next 24 hours or so, if no improvement then may need to consider placement of a percutaneous drain per IR of the fluid collection extending along the splenic flexure.  Am starting D5 half-normal saline at 75 cc/h with 40 of KCl  Replete potassium, check magnesium  and phosphorus CRP MiraLAX  once daily to try to keep stool moving through colon Encourage patient to try to ambulate if possible GI will continue to follow with you       Principal Problem:   Peripancreatic fluid collection Active Problems:   Abdominal distention   Drug-induced constipation   Dilation of pancreatic duct   Acute on chronic pancreatitis (HCC)   Malnutrition of moderate degree   Chronic abdominal pain     LOS: 13 days   Chalese Peach PA-C 04/24/2024, 2:47 PM

## 2024-04-24 NOTE — Progress Notes (Signed)
 Progress Note     Subjective: Surgery reconsulted after patient became distended again and repeat CT shows ongoing inflammatory changes in left upper quadrant with subsequent secondary inflammation and narrowing of the splenic flexure and proximal descending colon.  This afternoon patient reports ongoing abdominal pain.  No nausea or vomiting, sipping on fluids but ongoing low appetite.  Did have a bowel movement with enema today.  Objective: Vital signs in last 24 hours: Temp:  [97.5 F (36.4 C)-101.2 F (38.4 C)] 100.6 F (38.1 C) (05/25 1408) Pulse Rate:  [78-139] 117 (05/25 1408) Resp:  [18-22] 20 (05/25 1408) BP: (95-141)/(58-92) 114/69 (05/25 1408) SpO2:  [91 %-98 %] 93 % (05/25 1408) Last BM Date : 04/24/24  Intake/Output from previous [place]: 05/24 0701 - 05/25 0700 In: 720 [P.O.:720] Out: -  Intake/Output this shift: No intake/output data recorded.  PE: General: pleasant, WD male who is laying in bed in NAD HEENT: sclera anicteric Heart: RRR Lungs: Respiratory effort nonlabored Abd: soft, mildly to moderately distended, diffusely tender without guarding, prior surgical scars noted Psych: A&Ox3 with an appropriate affect.    Lab Results:  Recent Labs    04/22/24 0530 04/23/24 0607  WBC 23.7* 23.1*  HGB 11.9* 11.3*  HCT 36.7* 34.9*  PLT 664* 709*   BMET Recent Labs    04/22/24 0530 04/23/24 0607  NA 133* 134*  K 3.2* 3.1*  CL 97* 96*  CO2 26 29  GLUCOSE 118* 117*  BUN 8 6  CREATININE 0.99 0.99  CALCIUM 8.1* 8.1*   PT/INR No results for input(s): "LABPROT", "INR" in the last 72 hours. CMP     Component Value Date/Time   NA 134 (L) 04/23/2024 0607   NA 139 09/17/2022 1239   K 3.1 (L) 04/23/2024 0607   CL 96 (L) 04/23/2024 0607   CO2 29 04/23/2024 0607   GLUCOSE 117 (H) 04/23/2024 0607   BUN 6 04/23/2024 0607   BUN 6 09/17/2022 1239   CREATININE 0.99 04/23/2024 0607   CREATININE 0.94 07/26/2019 1210   CALCIUM 8.1 (L) 04/23/2024 0607    PROT 7.8 04/19/2024 0914   PROT 7.5 09/17/2022 1239   ALBUMIN 2.2 (L) 04/23/2024 0607   ALBUMIN 3.6 (L) 09/17/2022 1239   AST 21 04/19/2024 0914   ALT 16 04/19/2024 0914   ALT 24 05/02/2019 1617   ALKPHOS 79 04/19/2024 0914   BILITOT 0.9 04/19/2024 0914   BILITOT <0.2 09/17/2022 1239   GFRNONAA >60 04/23/2024 0607   GFRAA 106 10/08/2020 0943   Lipase     Component Value Date/Time   LIPASE 92 (H) 04/18/2024 0416       Studies/Results: CT ABDOMEN PELVIS WO CONTRAST Result Date: 04/23/2024 CLINICAL DATA:  Left flank tenderness.  Abdominal pain EXAM: CT ABDOMEN AND PELVIS WITHOUT CONTRAST TECHNIQUE: Multidetector CT imaging of the abdomen and pelvis was performed following the standard protocol without IV contrast. RADIATION DOSE REDUCTION: This exam was performed according to the departmental dose-optimization program which includes automated exposure control, adjustment of the mA and/or kV according to patient size and/or use of iterative reconstruction technique. COMPARISON:  CT angiogram abdomen 04/19/2024. Contrast abdomen pelvis CT 04/18/2024. FINDINGS: Lower chest: Persistent left lower lobe consolidative opacity with small left effusion. Atelectasis versus infiltrate. Recommend continued follow-up. There is some bandlike changes at the right lung base which have slightly increased from previous. Favor more atelectatic change. This includes the right lower lobe and middle lobe. Some linear changes in the lingula also seen. Small  pericardial effusion. Prominent pre cardiophrenic nodes are stable. Coronary artery calcifications are seen. Please correlate for other coronary risk factors. Hepatobiliary: Previous cholecystectomy. Once again there is some low-attenuation foci scattered in the liver which have the appearance of benign cystic foci. Unchanged from previous. Pancreas: Punctate calcifications in the uncinate process consistent with chronic calcific pancreatitis. The complex cystic  focus towards the tail is better seen on the prior study with contrast but today measures up to 2.7 x 1.7 cm. The fluid collections extending in the left upper quadrant with from the tail of the pancreas are again identified and similar in appearance to previous examination. Density these areas approach up to 13 Hounsfield units. Example size includes focus on image 29 measuring 5.7 x 4.7 cm today and previously this same area 5.6 x 4.0 cm, minimally increased. Collection more caudal on image 40 measures 5.5 by 3.6 cm and previously with smaller at 4.3 x 1.7 cm. Some of these are in close proximity to the splenic flexure of the colon. Also extension of the splenic hilum. Spleen: Once again there are several fluid collections along the margin of the spleen including subcapsular. Again these have Hounsfield units approaching less than 15. The spleen itself is nonenlarged. No increasing size of these collections overall. Focus inferolateral on image 29 today measures 5.5 by 2.9 cm and previously 5.8 by 2.6 cm. Similar. With the appearance of the spleen on the splenic infarcts are not excluded. Adrenals/Urinary Tract: No abnormal calcifications seen within either kidney nor along the course of either ureter. Preserved contour to the bladder. Adrenal glands are preserved. Stomach/Bowel: Oral contrast was administered. Stomach is underdistended. Small bowel is nondilated. There are a few loops of unopacified small bowel in the left mid abdomen. Large bowel is dilated proximally. The ascending colon as a diameter uptake 8.1 cm, mildly dilated. Contrast fluid levels are seen in the ascending and transverse colon. There is an abrupt caliber change at the splenic flexure as described above with a relatively long segment of wall thickening along the descending colon with increasing stranding compared to previous examination. Please correlate for an area of developing colitis or other process. Sigmoid colon diverticular seen.  Vascular/Lymphatic: Diffuse vascular calcifications identified along the aorta and branch vessels. Normal caliber aorta and IVC. No developing new lymph node enlargement identified in the abdomen and pelvis on this noncontrast examination. Reproductive: Prostate is unremarkable. Other: Increasing left hemi abdominal stranding. There is thickening along the tissues surround the descending colon including the lateral conal fascia. Left-sided perinephric stranding. No frank ascites. No definite free air. Musculoskeletal: Scattered degenerative changes identified greatest at L5-S1. There is also disc bulging at multiple lumbar levels. Please correlate for any significant stenosis. IMPRESSION: Slight increase in size of low-density fluid collection left upper quadrant involving tail the pancreas as well as the surrounding tissues extending to the splenic flexure of the colon. Collections are still relatively low in density. No clear signs of acute hematoma. No associated developing air in the collections. In principle infection is difficult to exclude on the basis of CT alone. The collections extending along the spleen itself including subcapsular are similar to previous examination. The heterogeneous of the spleen could also represent a component of splenic infarcts versus just subcapsular fluid collections. The fluid collection within the tail the pancreas self appears similar to previous. There is increasing wall thickening and stranding along the proximal descending colon extending from the splenic flexure. There is also increasing more proximal colonic dilatation. Cecum  has a diameter of over 8 cm. Please correlate for a component of partial obstruction. Please correlate with infectious, ischemic or inflammatory process of the colon. No small bowel dilatation. No widespread free air or ascites. Persistent pleural effusion at the left lung base with adjacent parenchymal opacities. The opacity the right lung base is  slightly increasing. Again atelectatic versus infectious is possible. Recommend follow-up. Coronary artery calcifications. Please correlate for other coronary risk factors. Electronically Signed   By: Adrianna Horde M.D.   On: 04/23/2024 15:04    Anti-infectives: Anti-infectives (From admission, onward)    Start     Dose/Rate Route Frequency Ordered Stop   04/19/24 1000  piperacillin -tazobactam (ZOSYN ) IVPB 3.375 g        3.375 g 12.5 mL/hr over 240 Minutes Intravenous Every 8 hours 04/19/24 0854     04/12/24 1000  cefTRIAXone  (ROCEPHIN ) 2 g in sodium chloride  0.9 % 100 mL IVPB  Status:  Discontinued        2 g 200 mL/hr over 30 Minutes Intravenous Daily 04/12/24 0758 04/19/24 0849   04/10/24 2100  metroNIDAZOLE  (FLAGYL ) IVPB 500 mg  Status:  Discontinued        500 mg 100 mL/hr over 60 Minutes Intravenous Every 12 hours 04/10/24 0840 04/19/24 0849   04/10/24 2100  vancomycin  (VANCOCIN ) IVPB 1000 mg/200 mL premix  Status:  Discontinued        1,000 mg 200 mL/hr over 60 Minutes Intravenous Every 12 hours 04/10/24 0940 04/12/24 0758   04/10/24 1600  ceFEPIme  (MAXIPIME ) 2 g in sodium chloride  0.9 % 100 mL IVPB  Status:  Discontinued        2 g 200 mL/hr over 30 Minutes Intravenous Every 8 hours 04/10/24 0940 04/12/24 0758   04/10/24 0830  metroNIDAZOLE  (FLAGYL ) IVPB 500 mg        500 mg 100 mL/hr over 60 Minutes Intravenous  Once 04/10/24 0819 04/10/24 1222   04/10/24 0800  vancomycin  (VANCOREADY) IVPB 1750 mg/350 mL        1,750 mg 175 mL/hr over 120 Minutes Intravenous  Once 04/10/24 0722 04/10/24 1120   04/10/24 0730  ceFEPIme  (MAXIPIME ) 2 g in sodium chloride  0.9 % 100 mL IVPB        2 g 200 mL/hr over 30 Minutes Intravenous  Once 04/10/24 0722 04/10/24 1430        Assessment/Plan  58 y/o M with acute on chronic pancreatitis with peripancreatic fluid collections Pancreatic pseudoaneurysm  - IR evaluated and sees a pancreatic tail pseudoaneurysm, likely arising from splenic  artery, and ordered a CTA to further evaluate. CTA shows two peripancreatic fluid collections that are felt to be thrombosed due to pseudoaneurysm and drainage/aspiration is not recommended. There are some subcapsular splenic fluid collections that they do not recommend draining. There is an additional fluid collection near the descending colon that is too small for drainage. - IV abx - Per GI   Possible partial large bowel obstruction Inflammatory stranding around the proximal colon this secondary to his pancreatitis.  Very mild upstream dilation and is tolerating p.o.  Having bowel movements, today requiring an enema.  No emergent surgical needs.  Continue liquids, can consider miralax  as needed.  Agree with evaluation by GI team earlier today; observe over the next 24 hours and consider percutaneous drainage of the fluid collection extending along the splenic flexure if no improvement.  Discussed with patient if obstructive process progresses only surgical recourse would be a diverting ostomy, but I  do not think we are at that point currently.    FEN - FLD, IVF per primary; consider TPN VTE - SCD's ID - Zosyn  5/20>>  - per TR[facility] -   COVID + COPD GERD Spinal stenosis - on lyrica   Tobacco abuse    LOS: 13 days   I reviewed Consultant GI notes, hospitalist notes, last 24 h vitals and pain scores, last 48 h intake and output, last 24 h labs and trends, and last 24 h imaging results.  This care required moderate level of medical decision making.    Adalberto Acton, MD  [facility] Surgery 04/24/2024, 4:31 PM Please see Amion for pager number during [place] hours 7:00am-4:30pm

## 2024-04-25 DIAGNOSIS — J441 Chronic obstructive pulmonary disease with (acute) exacerbation: Secondary | ICD-10-CM | POA: Diagnosis not present

## 2024-04-25 DIAGNOSIS — K5669 Other partial intestinal obstruction: Secondary | ICD-10-CM | POA: Diagnosis not present

## 2024-04-25 DIAGNOSIS — Z8719 Personal history of other diseases of the digestive system: Secondary | ICD-10-CM

## 2024-04-25 DIAGNOSIS — R140 Abdominal distension (gaseous): Secondary | ICD-10-CM | POA: Diagnosis not present

## 2024-04-25 DIAGNOSIS — K8689 Other specified diseases of pancreas: Secondary | ICD-10-CM | POA: Diagnosis not present

## 2024-04-25 DIAGNOSIS — R1032 Left lower quadrant pain: Secondary | ICD-10-CM | POA: Diagnosis not present

## 2024-04-25 LAB — RENAL FUNCTION PANEL
Albumin: 3 g/dL — ABNORMAL LOW (ref 3.5–5.0)
Anion gap: 12 (ref 5–15)
BUN: 6 mg/dL (ref 6–20)
CO2: 26 mmol/L (ref 22–32)
Calcium: 8.6 mg/dL — ABNORMAL LOW (ref 8.9–10.3)
Chloride: 98 mmol/L (ref 98–111)
Creatinine, Ser: 0.83 mg/dL (ref 0.61–1.24)
GFR, Estimated: >60 mL/min (ref >60)
Glucose, Bld: 181 mg/dL — ABNORMAL HIGH (ref 70–99)
Phosphorus: 1.6 mg/dL — ABNORMAL LOW (ref 2.5–4.6)
Potassium: 3.9 mmol/L (ref 3.5–5.1)
Sodium: 136 mmol/L (ref 135–145)

## 2024-04-25 LAB — HEPATIC FUNCTION PANEL
ALT: 49 U/L — ABNORMAL HIGH (ref 0–44)
AST: 71 U/L — ABNORMAL HIGH (ref 15–41)
Albumin: 3 g/dL — ABNORMAL LOW (ref 3.5–5.0)
Alkaline Phosphatase: 204 U/L — ABNORMAL HIGH (ref 38–126)
Bilirubin, Direct: 0.9 mg/dL — ABNORMAL HIGH (ref 0.0–0.2)
Indirect Bilirubin: 0.8 mg/dL (ref 0.3–0.9)
Total Bilirubin: 1.7 mg/dL — ABNORMAL HIGH (ref 0.0–1.2)
Total Protein: 6.8 g/dL (ref 6.5–8.1)

## 2024-04-25 LAB — CBC
HCT: 29.5 % — ABNORMAL LOW (ref 39.0–52.0)
Hemoglobin: 9.6 g/dL — ABNORMAL LOW (ref 13.0–17.0)
MCH: 29.2 pg (ref 26.0–34.0)
MCHC: 32.5 g/dL (ref 30.0–36.0)
MCV: 89.7 fL (ref 80.0–100.0)
Platelets: 712 10*3/uL — ABNORMAL HIGH (ref 150–400)
RBC: 3.29 MIL/uL — ABNORMAL LOW (ref 4.22–5.81)
RDW: 16.7 % — ABNORMAL HIGH (ref 11.5–15.5)
WBC: 35.6 10*3/uL — ABNORMAL HIGH (ref 4.0–10.5)
nRBC: 0 % (ref 0.0–0.2)

## 2024-04-25 LAB — C-REACTIVE PROTEIN: CRP: 21.3 mg/dL — ABNORMAL HIGH (ref <1.0)

## 2024-04-25 NOTE — Progress Notes (Signed)
 Gastroenterology Inpatient Follow-up Note   PATIENT IDENTIFICATION  Wesley [name] is a 58 y.o. male [facility]: 4  SUBJECTIVE  The patient's chart has been reviewed. The patient's labs have been reviewed. Today, the patient is tolerating liquids. Had a slight bowel movement with bowel regimen. Abdominal pain that is worse than his baseline. No blood in his stool has been noted. He denies fevers or chills.   OBJECTIVE   Scheduled Inpatient Medications:   amitriptyline   50 mg Oral QHS   B-complex with vitamin C  1 tablet Oral Daily   cyanocobalamin   1,000 mcg Oral Daily   feeding supplement  1 Container Oral TID BM   fluticasone  furoate-vilanterol  1 puff Inhalation Daily   lipase/protease/amylase  72,000 Units Oral TID WC   And   lipase/protease/amylase  36,000 Units Oral With snacks   midodrine  10 mg Oral TID WC   multivitamin with minerals  1 tablet Oral Daily   pantoprazole   40 mg Oral BID   polyethylene glycol  17 g Oral Daily   pregabalin   150 mg Oral TID   umeclidinium bromide   1 puff Inhalation Daily   Continuous Inpatient Infusions:   dextrose  5 % and 0.45 % NaCl with KCl 40 mEq/L 75 mL/hr at 04/24/24 1847   piperacillin -tazobactam (ZOSYN )  IV 3.375 g (04/25/24 0542)   PRN Inpatient Medications: acetaminophen  **OR** acetaminophen , baclofen , glucagon  (human recombinant), guaiFENesin , hydrALAZINE , HYDROmorphone  (DILAUDID ) injection, ipratropium-albuterol , metoprolol  tartrate, [DISCONTINUED] ondansetron  **OR** ondansetron  (ZOFRAN ) IV, oxyCODONE , phenol, prochlorperazine    Physical Examination   Temp:  [98.2 F (36.8 C)-100.9 F (38.3 C)] 98.2 F (36.8 C) (05/25 1933) Pulse Rate:  [103-139] 106 (05/25 1933) Resp:  [18-25] 18 (05/25 1933) BP: (95-141)/(58-92) 110/75 (05/25 1933) SpO2:  [91 %-97 %] 97 % (05/25 1933) Temp (24hrs), Avg:99.5 F (37.5 C), Min:98.2 F (36.8 C), Max:100.9 F (38.3 C)  Weight: 75.6 kg GEN: Chronically ill-appearing PSYCH:  Cooperative, without pressured speech EYE: Conjunctivae pink, sclerae anicteric ENT: MMM CV: Tachycardic RESP: Wheezing and coughing noted GI: NABS, soft, tenderness to palpation generalized throughout the upper abdomen, volitional guarding present, no rebound  MSK/EXT: No significant lower extremity edema SKIN: No jaundice NEURO:  Alert & Oriented x 3, no focal deficits   Review of Data   Laboratory Studies   Recent Labs  Lab 04/22/24 0530 04/23/24 0607 04/24/24 1821  NA 133* 134*  --   K 3.2* 3.1*  --   CL 97* 96*  --   CO2 26 29  --   BUN 8 6  --   CREATININE 0.99 0.99  --   GLUCOSE 118* 117*  --   CALCIUM 8.1* 8.1*  --   MG  --   --  2.5*  PHOS 1.9* 2.9 2.6   Recent Labs  Lab 04/19/24 0914  AST 21  ALT 16  ALKPHOS 79    Recent Labs  Lab 04/21/24 0529 04/22/24 0530 04/23/24 0607  WBC 24.7* 23.7* 23.1*  HGB 12.2* 11.9* 11.3*  HCT 37.6* 36.7* 34.9*  PLT 593* 664* 709*   No results for input(s): "APTT", "INR" in the last 168 hours.  Imaging Studies   CT ABDOMEN PELVIS WO CONTRAST Result Date: 04/23/2024 CLINICAL DATA:  Left flank tenderness.  Abdominal pain EXAM: CT ABDOMEN AND PELVIS WITHOUT CONTRAST TECHNIQUE: Multidetector CT imaging of the abdomen and pelvis was performed following the standard protocol without IV contrast. RADIATION DOSE REDUCTION: This exam was performed according to the departmental dose-optimization program  which includes automated exposure control, adjustment of the mA and/or kV according to patient size and/or use of iterative reconstruction technique. COMPARISON:  CT angiogram abdomen 04/19/2024. Contrast abdomen pelvis CT 04/18/2024. FINDINGS: Lower chest: Persistent left lower lobe consolidative opacity with small left effusion. Atelectasis versus infiltrate. Recommend continued follow-up. There is some bandlike changes at the right lung base which have slightly increased from previous. Favor more atelectatic change. This includes the  right lower lobe and middle lobe. Some linear changes in the lingula also seen. Small pericardial effusion. Prominent pre cardiophrenic nodes are stable. Coronary artery calcifications are seen. Please correlate for other coronary risk factors. Hepatobiliary: Previous cholecystectomy. Once again there is some low-attenuation foci scattered in the liver which have the appearance of benign cystic foci. Unchanged from previous. Pancreas: Punctate calcifications in the uncinate process consistent with chronic calcific pancreatitis. The complex cystic focus towards the tail is better seen on the prior study with contrast but today measures up to 2.7 x 1.7 cm. The fluid collections extending in the left upper quadrant with from the tail of the pancreas are again identified and similar in appearance to previous examination. Density these areas approach up to 13 Hounsfield units. Example size includes focus on image 29 measuring 5.7 x 4.7 cm today and previously this same area 5.6 x 4.0 cm, minimally increased. Collection more caudal on image 40 measures 5.5 by 3.6 cm and previously with smaller at 4.3 x 1.7 cm. Some of these are in close proximity to the splenic flexure of the colon. Also extension of the splenic hilum. Spleen: Once again there are several fluid collections along the margin of the spleen including subcapsular. Again these have Hounsfield units approaching less than 15. The spleen itself is nonenlarged. No increasing size of these collections overall. Focus inferolateral on image 29 today measures 5.5 by 2.9 cm and previously 5.8 by 2.6 cm. Similar. With the appearance of the spleen on the splenic infarcts are not excluded. Adrenals/Urinary Tract: No abnormal calcifications seen within either kidney nor along the course of either ureter. Preserved contour to the bladder. Adrenal glands are preserved. Stomach/Bowel: Oral contrast was administered. Stomach is underdistended. Small bowel is nondilated. There  are a few loops of unopacified small bowel in the left mid abdomen. Large bowel is dilated proximally. The ascending colon as a diameter uptake 8.1 cm, mildly dilated. Contrast fluid levels are seen in the ascending and transverse colon. There is an abrupt caliber change at the splenic flexure as described above with a relatively long segment of wall thickening along the descending colon with increasing stranding compared to previous examination. Please correlate for an area of developing colitis or other process. Sigmoid colon diverticular seen. Vascular/Lymphatic: Diffuse vascular calcifications identified along the aorta and branch vessels. Normal caliber aorta and IVC. No developing new lymph node enlargement identified in the abdomen and pelvis on this noncontrast examination. Reproductive: Prostate is unremarkable. Other: Increasing left hemi abdominal stranding. There is thickening along the tissues surround the descending colon including the lateral conal fascia. Left-sided perinephric stranding. No frank ascites. No definite free air. Musculoskeletal: Scattered degenerative changes identified greatest at L5-S1. There is also disc bulging at multiple lumbar levels. Please correlate for any significant stenosis. IMPRESSION: Slight increase in size of low-density fluid collection left upper quadrant involving tail the pancreas as well as the surrounding tissues extending to the splenic flexure of the colon. Collections are still relatively low in density. No clear signs of acute hematoma. No associated  developing air in the collections. In principle infection is difficult to exclude on the basis of CT alone. The collections extending along the spleen itself including subcapsular are similar to previous examination. The heterogeneous of the spleen could also represent a component of splenic infarcts versus just subcapsular fluid collections. The fluid collection within the tail the pancreas self appears similar  to previous. There is increasing wall thickening and stranding along the proximal descending colon extending from the splenic flexure. There is also increasing more proximal colonic dilatation. Cecum has a diameter of over 8 cm. Please correlate for a component of partial obstruction. Please correlate with infectious, ischemic or inflammatory process of the colon. No small bowel dilatation. No widespread free air or ascites. Persistent pleural effusion at the left lung base with adjacent parenchymal opacities. The opacity the right lung base is slightly increasing. Again atelectatic versus infectious is possible. Recommend follow-up. Coronary artery calcifications. Please correlate for other coronary risk factors. Electronically Signed   By: Adrianna Horde M.D.   On: 04/23/2024 15:04   GI Procedures and Studies  No new GI procedures to review   ASSESSMENT  Mr. Toner is a 58 y.o. male with a pmh significant for COPD, prior hepatitis C, chronic pancreatitis (secondary to alcohol), recurrent pseudocyst, GERD, chronic abdominal pain.  Patient admitted with hypoxic respiratory failure from COVID and subsequently with pancreatitis and progressive peripancreatic fluid collection leading to concern of partial large bowel obstruction.  The patient is hemodynamically stable.  Clinically does seem slightly worse compared to when I last saw him earlier last week.  I discussed the case with Dr. Cherryl Corona.  Overall case is suggestive of inflammatory component within the descending colon from peripancreatic fluid that is still immature for any attempt at cystenterostomy and likely from any attempt for IR drainage.  I think that patient will need to be monitored and continue to have an aggressive bowel regimen.  With time, the fluid may mature to a point where there could be an attempt at drainage (but again his location is quite difficult to understand).  The previous perisplenic fluid collections are not concerning for  infection at this time and so attempting to sample these as has been documented in my prior notes and by interventional radiology is felt to be high risk.  Difficult situation overall.  Endoscopic evaluation from a colon perspective would not make great sense he has just had a colonoscopy within the last 2 years so unlikely that this would be a new malignancy.  If he has further issues, it may make sense to just put him on TPN, but we will hold on that for now.  All patient questions were answered to the best of my ability, and the patient agrees to the aforementioned plan of action with follow-up as indicated.   PLAN/RECOMMENDATIONS  May continue current diet Consider repeat cross-sectional imaging study in 5 to 7 days from last imaging study unless something else complicates or worsens Continue bowel regimen as you have done No clear need for antibiotic therapy currently Follow-up ESR/CRP (added on today) No plan for endoscopic evaluation from above or below at this time Appreciate surgical evaluation and recommendations   [facility] team will continue to follow periodically.  Please page/call with questions or concerns.   Yong Henle, MD  [facility] Office # [phone]    LOS: 14 days  Normie Becton  04/25/2024, 5:51 AM

## 2024-04-25 NOTE — Progress Notes (Addendum)
 [facility]                                                                              Darlene Brienza, is a 58 y.o. male, DOB - 03/18/1966, ZOX:[id number] Admit date - 04/10/2024    Outpatient Primary MD for the patient is [name], DO  LOS - 14  days  Chief Complaint  Patient presents with   Shortness of Breath       Brief summary   Patient is a 58 year old male with history of suspected lung CA, GERD, COPD, chronic pancreatitis with pseudocyst presented to the hospital with cough, abdominal pain.  He was found to have COPD exacerbation in the setting of COVID infection along with acute on chronic pancreatitis complicated by pseudocyst. He was placed on IV fluids, pain control, bronchodilators, diet was advanced On 5/17, patient noted to have abdominal distention with likely ileus, no SBO or obstruction.   Assessment & Plan   Acute on chronic pancreatitis, intra-abdominal fluid collections, pseudocyst, pseudoaneurysm - CT abdomen on 5/19 showed perisplenic fluid collection, another fluid collection near the diaphragm, wall thickening and inflammation of the splenic flexure of the colon,  largest collection adjacent to the colon, 2.3x 4.4x 5.9 cm - NGT out, tolerating full liquids, continue IV Zosyn .  - Symptomatically improving however still complaining of significant pain in the left side - GI following, no role for stenting.  Percutaneous drainage may be beneficial at some point, defer to IR when it would be amenable to drainage.  - Per GI, surgery, continue supportive care, antibiotics, pain control, poor p.o. intake, will need to start TPN or postpyloric feeding with cortrack (preferred) - Surgery following, no acute surgical needs  Possible partial large bowel obstruction - CT abdomen showed 2.3 x 4.4 x 5.9 cm collection involving the hepatic flexure with proximal dilation and air fluid levels of the colon, patient could be partially obstructed at this  level.  Patient also had severe constipation, ileus, abdominal distention, NGT was placed - NGT out once patient started tolerating diet and having BMs, Per surgery his bowel movements have resumed, large bowel obstruction is unlikely -On 5/25, pain and distention much worse, improved with smog enema, had multiple BMs. -General Surgery following  descending colitis -Likely reactive or ischemic colitis.  - Will likely need colonoscopy once acute inflammation improves - Continue IV Zosyn   COVID infection - COVID was positive on 04/10/2024, currently no hypoxia or shortness of breath -Airborne precautions removed   COPD exacerbation, - Improved, no wheezing - Continue bronchodilators  RUL nodule, suspected lung Ca - Patient had a PET scan, last 2 biopsies were nondiagnostic, will need to follow-up outpatient.  Has seen radiation oncology recently and was recommended repeating another CT scan in 6 months  GERD/gastritis - Continue PPI   intermittent blurry vision - MRI brain is negative, recommended outpatient follow-up with ophthalmology  Hyponatremia - Improving  Hypokalemia Replace as needed  Obstipation/constipation - Continue stool softeners, received Relistor  on 5/19  Interventions: Boost Breeze, MVI, Education  Estimated body mass index is 25.35 kg/m as calculated from the following:   Height as of this encounter:  5\' 8"  (1.727 m).   Weight as of this encounter: 75.6 kg.  Code Status: Full code DVT Prophylaxis:  Place and maintain sequential compression device Start: 04/21/24 0558   Level of Care: Level of care: Telemetry Family Communication: Updated patient Disposition Plan:      Remains inpatient appropriate:      Procedures:    Consultants:   General Surgery GI Intervention radiology  Antimicrobials:   Anti-infectives (From admission, onward)    Start     Dose/Rate Route Frequency Ordered Stop   04/19/24 1000  piperacillin -tazobactam (ZOSYN ) IVPB  3.375 g        3.375 g 12.5 mL/hr over 240 Minutes Intravenous Every 8 hours 04/19/24 0854     04/12/24 1000  cefTRIAXone  (ROCEPHIN ) 2 g in sodium chloride  0.9 % 100 mL IVPB  Status:  Discontinued        2 g 200 mL/hr over 30 Minutes Intravenous Daily 04/12/24 0758 04/19/24 0849   04/10/24 2100  metroNIDAZOLE  (FLAGYL ) IVPB 500 mg  Status:  Discontinued        500 mg 100 mL/hr over 60 Minutes Intravenous Every 12 hours 04/10/24 0840 04/19/24 0849   04/10/24 2100  vancomycin  (VANCOCIN ) IVPB 1000 mg/200 mL premix  Status:  Discontinued        1,000 mg 200 mL/hr over 60 Minutes Intravenous Every 12 hours 04/10/24 0940 04/12/24 0758   04/10/24 1600  ceFEPIme  (MAXIPIME ) 2 g in sodium chloride  0.9 % 100 mL IVPB  Status:  Discontinued        2 g 200 mL/hr over 30 Minutes Intravenous Every 8 hours 04/10/24 0940 04/12/24 0758   04/10/24 0830  metroNIDAZOLE  (FLAGYL ) IVPB 500 mg        500 mg 100 mL/hr over 60 Minutes Intravenous  Once 04/10/24 0819 04/10/24 1222   04/10/24 0800  vancomycin  (VANCOREADY) IVPB 1750 mg/350 mL        1,750 mg 175 mL/hr over 120 Minutes Intravenous  Once 04/10/24 0722 04/10/24 1120   04/10/24 0730  ceFEPIme  (MAXIPIME ) 2 g in sodium chloride  0.9 % 100 mL IVPB        2 g 200 mL/hr over 30 Minutes Intravenous  Once 04/10/24 0722 04/10/24 1430          Medications  amitriptyline   50 mg Oral QHS   B-complex with vitamin C  1 tablet Oral Daily   cyanocobalamin   1,000 mcg Oral Daily   feeding supplement  1 Container Oral TID BM   fluticasone  furoate-vilanterol  1 puff Inhalation Daily   lipase/protease/amylase  72,000 Units Oral TID WC   And   lipase/protease/amylase  36,000 Units Oral With snacks   midodrine  10 mg Oral TID WC   multivitamin with minerals  1 tablet Oral Daily   pantoprazole   40 mg Oral BID   polyethylene glycol  17 g Oral Daily   pregabalin   150 mg Oral TID   umeclidinium bromide   1 puff Inhalation Daily      Subjective:   Reinhard Ruta was  seen and examined today.  Abdomen much less distended, pain better however poor p.o. intake, only sips of drinks.  Multiple BMs after smog enema yesterday.  Objective:   Vitals:   04/24/24 1739 04/24/24 1933 04/25/24 0614 04/25/24 0815  BP: 124/68 110/75 112/68   Pulse: (!) 123 (!) 106 86   Resp: (!) 25 18 18    Temp: (!) 100.9 F (38.3 C) 98.2 F (36.8 C) 98.3 F (36.8  C)   TempSrc: Oral Oral Oral   SpO2: 91% 97% 100% 97%  Weight:      Height:        Intake/Output Summary (Last 24 hours) at 04/25/2024 1142 Last data filed at 04/24/2024 1847 Gross per 24 hour  Intake 821.68 ml  Output --  Net 821.68 ml     Wt Readings from Last 3 Encounters:  04/21/24 75.6 kg  03/10/24 79.9 kg  12/29/23 75.8 kg    Physical Exam General: Alert and oriented x 3, NAD Cardiovascular: S1 S2 clear, RRR.  Respiratory: CTAB Gastrointestinal: Soft, moderately distended, diffuse TTP, +BS ext: no pedal edema bilaterally Neuro: no new deficits Psych: Normal affect   Data Reviewed:  I have personally reviewed following labs    CBC Lab Results  Component Value Date   WBC 35.6 (H) 04/25/2024   RBC 3.29 (L) 04/25/2024   HGB 9.6 (L) 04/25/2024   HCT 29.5 (L) 04/25/2024   MCV 89.7 04/25/2024   MCH 29.2 04/25/2024   PLT 712 (H) 04/25/2024   MCHC 32.5 04/25/2024   RDW 16.7 (H) 04/25/2024   LYMPHSABS 2.2 04/18/2024   MONOABS 1.6 (H) 04/18/2024   EOSABS 0.0 04/18/2024   BASOSABS 0.0 04/18/2024     Last metabolic panel Lab Results  Component Value Date   NA 136 04/25/2024   K 3.9 04/25/2024   CL 98 04/25/2024   CO2 26 04/25/2024   BUN 6 04/25/2024   CREATININE 0.83 04/25/2024   GLUCOSE 181 (H) 04/25/2024   GFRNONAA >60 04/25/2024   GFRAA 106 10/08/2020   CALCIUM 8.6 (L) 04/25/2024   PHOS 1.6 (L) 04/25/2024   PROT 6.8 04/25/2024   ALBUMIN 3.0 (L) 04/25/2024   ALBUMIN 3.0 (L) 04/25/2024   LABGLOB 3.9 09/17/2022   AGRATIO 0.9 (L) 09/17/2022   BILITOT 1.7 (H) 04/25/2024    ALKPHOS 204 (H) 04/25/2024   AST 71 (H) 04/25/2024   ALT 49 (H) 04/25/2024   ANIONGAP 12 04/25/2024    CBG (last 3)  No results for input(s): "GLUCAP" in the last 72 hours.    Coagulation Profile: No results for input(s): "INR", "PROTIME" in the last 168 hours.   Radiology Studies: I have personally reviewed the imaging studies  CT ABDOMEN PELVIS WO CONTRAST Result Date: 04/23/2024 CLINICAL DATA:  Left flank tenderness.  Abdominal pain EXAM: CT ABDOMEN AND PELVIS WITHOUT CONTRAST TECHNIQUE: Multidetector CT imaging of the abdomen and pelvis was performed following the standard protocol without IV contrast. RADIATION DOSE REDUCTION: This exam was performed according to the departmental dose-optimization program which includes automated exposure control, adjustment of the mA and/or kV according to patient size and/or use of iterative reconstruction technique. COMPARISON:  CT angiogram abdomen 04/19/2024. Contrast abdomen pelvis CT 04/18/2024. FINDINGS: Lower chest: Persistent left lower lobe consolidative opacity with small left effusion. Atelectasis versus infiltrate. Recommend continued follow-up. There is some bandlike changes at the right lung base which have slightly increased from previous. Favor more atelectatic change. This includes the right lower lobe and middle lobe. Some linear changes in the lingula also seen. Small pericardial effusion. Prominent pre cardiophrenic nodes are stable. Coronary artery calcifications are seen. Please correlate for other coronary risk factors. Hepatobiliary: Previous cholecystectomy. Once again there is some low-attenuation foci scattered in the liver which have the appearance of benign cystic foci. Unchanged from previous. Pancreas: Punctate calcifications in the uncinate process consistent with chronic calcific pancreatitis. The complex cystic focus towards the tail is better seen on the prior  study with contrast but today measures up to 2.7 x 1.7 cm. The  fluid collections extending in the left upper quadrant with from the tail of the pancreas are again identified and similar in appearance to previous examination. Density these areas approach up to 13 Hounsfield units. Example size includes focus on image 29 measuring 5.7 x 4.7 cm today and previously this same area 5.6 x 4.0 cm, minimally increased. Collection more caudal on image 40 measures 5.5 by 3.6 cm and previously with smaller at 4.3 x 1.7 cm. Some of these are in close proximity to the splenic flexure of the colon. Also extension of the splenic hilum. Spleen: Once again there are several fluid collections along the margin of the spleen including subcapsular. Again these have Hounsfield units approaching less than 15. The spleen itself is nonenlarged. No increasing size of these collections overall. Focus inferolateral on image 29 today measures 5.5 by 2.9 cm and previously 5.8 by 2.6 cm. Similar. With the appearance of the spleen on the splenic infarcts are not excluded. Adrenals/Urinary Tract: No abnormal calcifications seen within either kidney nor along the course of either ureter. Preserved contour to the bladder. Adrenal glands are preserved. Stomach/Bowel: Oral contrast was administered. Stomach is underdistended. Small bowel is nondilated. There are a few loops of unopacified small bowel in the left mid abdomen. Large bowel is dilated proximally. The ascending colon as a diameter uptake 8.1 cm, mildly dilated. Contrast fluid levels are seen in the ascending and transverse colon. There is an abrupt caliber change at the splenic flexure as described above with a relatively long segment of wall thickening along the descending colon with increasing stranding compared to previous examination. Please correlate for an area of developing colitis or other process. Sigmoid colon diverticular seen. Vascular/Lymphatic: Diffuse vascular calcifications identified along the aorta and branch vessels. Normal caliber  aorta and IVC. No developing new lymph node enlargement identified in the abdomen and pelvis on this noncontrast examination. Reproductive: Prostate is unremarkable. Other: Increasing left hemi abdominal stranding. There is thickening along the tissues surround the descending colon including the lateral conal fascia. Left-sided perinephric stranding. No frank ascites. No definite free air. Musculoskeletal: Scattered degenerative changes identified greatest at L5-S1. There is also disc bulging at multiple lumbar levels. Please correlate for any significant stenosis. IMPRESSION: Slight increase in size of low-density fluid collection left upper quadrant involving tail the pancreas as well as the surrounding tissues extending to the splenic flexure of the colon. Collections are still relatively low in density. No clear signs of acute hematoma. No associated developing air in the collections. In principle infection is difficult to exclude on the basis of CT alone. The collections extending along the spleen itself including subcapsular are similar to previous examination. The heterogeneous of the spleen could also represent a component of splenic infarcts versus just subcapsular fluid collections. The fluid collection within the tail the pancreas self appears similar to previous. There is increasing wall thickening and stranding along the proximal descending colon extending from the splenic flexure. There is also increasing more proximal colonic dilatation. Cecum has a diameter of over 8 cm. Please correlate for a component of partial obstruction. Please correlate with infectious, ischemic or inflammatory process of the colon. No small bowel dilatation. No widespread free air or ascites. Persistent pleural effusion at the left lung base with adjacent parenchymal opacities. The opacity the right lung base is slightly increasing. Again atelectatic versus infectious is possible. Recommend follow-up. Coronary artery  calcifications. Please  correlate for other coronary risk factors. Electronically Signed   By: Adrianna Horde M.D.   On: 04/23/2024 15:04        Cooper Moroney M.D. [facility] 04/25/2024, 11:42 AM  Available via Epic secure chat 7am-7pm After 7 pm, please refer to night coverage provider listed on amion.

## 2024-04-25 NOTE — Plan of Care (Signed)
  Problem: Education: Goal: Knowledge of General Education information will improve Description: Including pain rating scale, medication(s)/side effects and non-pharmacologic comfort measures Outcome: Progressing   Problem: Activity: Goal: Risk for activity intolerance will decrease Outcome: Progressing   Problem: Coping: Goal: Level of anxiety will decrease Outcome: Progressing   Problem: Elimination: Goal: Will not experience complications related to bowel motility Outcome: Progressing   Problem: Safety: Goal: Ability to remain free from injury will improve Outcome: Progressing   Problem: Skin Integrity: Goal: Risk for impaired skin integrity will decrease Outcome: Progressing   

## 2024-04-25 NOTE — Progress Notes (Signed)
 Subjective/Chief Complaint: Pt bloated but no vomiting  Had BM yesterday  Pain LUQ  WBC up    Objective: Vital signs in last 24 hours: Temp:  [98.2 F (36.8 C)-100.9 F (38.3 C)] 98.3 F (36.8 C) (05/26 1610) Pulse Rate:  [86-139] 86 (05/26 0614) Resp:  [18-25] 18 (05/26 0614) BP: (95-141)/(58-92) 112/68 (05/26 0614) SpO2:  [91 %-100 %] 100 % (05/26 0614) Last BM Date : 04/25/24  Intake/Output from previous Biby: 05/25 0701 - 05/26 0700 In: 821.7 [I.V.:1.1; IV Piggyback:820.6] Out: -  Intake/Output this shift: No intake/output data recorded.  Abd: soft, mildly to moderately distended, diffusely tender without guarding, prior surgical scars noted       Lab Results:  Recent Labs    04/23/24 0607 04/25/24 0459  WBC 23.1* 35.6*  HGB 11.3* 9.6*  HCT 34.9* 29.5*  PLT 709* 712*   BMET Recent Labs    04/23/24 0607 04/25/24 0459  NA 134* 136  K 3.1* 3.9  CL 96* 98  CO2 29 26  GLUCOSE 117* 181*  BUN 6 6  CREATININE 0.99 0.83  CALCIUM 8.1* 8.6*   PT/INR No results for input(s): "LABPROT", "INR" in the last 72 hours. ABG No results for input(s): "PHART", "HCO3" in the last 72 hours.  Invalid input(s): "PCO2", "PO2"  Studies/Results: CT ABDOMEN PELVIS WO CONTRAST Result Date: 04/23/2024 CLINICAL DATA:  Left flank tenderness.  Abdominal pain EXAM: CT ABDOMEN AND PELVIS WITHOUT CONTRAST TECHNIQUE: Multidetector CT imaging of the abdomen and pelvis was performed following the standard protocol without IV contrast. RADIATION DOSE REDUCTION: This exam was performed according to the departmental dose-optimization program which includes automated exposure control, adjustment of the mA and/or kV according to patient size and/or use of iterative reconstruction technique. COMPARISON:  CT angiogram abdomen 04/19/2024. Contrast abdomen pelvis CT 04/18/2024. FINDINGS: Lower chest: Persistent left lower lobe consolidative opacity with small left effusion. Atelectasis versus  infiltrate. Recommend continued follow-up. There is some bandlike changes at the right lung base which have slightly increased from previous. Favor more atelectatic change. This includes the right lower lobe and middle lobe. Some linear changes in the lingula also seen. Small pericardial effusion. Prominent pre cardiophrenic nodes are stable. Coronary artery calcifications are seen. Please correlate for other coronary risk factors. Hepatobiliary: Previous cholecystectomy. Once again there is some low-attenuation foci scattered in the liver which have the appearance of benign cystic foci. Unchanged from previous. Pancreas: Punctate calcifications in the uncinate process consistent with chronic calcific pancreatitis. The complex cystic focus towards the tail is better seen on the prior study with contrast but today measures up to 2.7 x 1.7 cm. The fluid collections extending in the left upper quadrant with from the tail of the pancreas are again identified and similar in appearance to previous examination. Density these areas approach up to 13 Hounsfield units. Example size includes focus on image 29 measuring 5.7 x 4.7 cm today and previously this same area 5.6 x 4.0 cm, minimally increased. Collection more caudal on image 40 measures 5.5 by 3.6 cm and previously with smaller at 4.3 x 1.7 cm. Some of these are in close proximity to the splenic flexure of the colon. Also extension of the splenic hilum. Spleen: Once again there are several fluid collections along the margin of the spleen including subcapsular. Again these have Hounsfield units approaching less than 15. The spleen itself is nonenlarged. No increasing size of these collections overall. Focus inferolateral on image 29 today measures 5.5 by 2.9 cm  and previously 5.8 by 2.6 cm. Similar. With the appearance of the spleen on the splenic infarcts are not excluded. Adrenals/Urinary Tract: No abnormal calcifications seen within either kidney nor along the course  of either ureter. Preserved contour to the bladder. Adrenal glands are preserved. Stomach/Bowel: Oral contrast was administered. Stomach is underdistended. Small bowel is nondilated. There are a few loops of unopacified small bowel in the left mid abdomen. Large bowel is dilated proximally. The ascending colon as a diameter uptake 8.1 cm, mildly dilated. Contrast fluid levels are seen in the ascending and transverse colon. There is an abrupt caliber change at the splenic flexure as described above with a relatively long segment of wall thickening along the descending colon with increasing stranding compared to previous examination. Please correlate for an area of developing colitis or other process. Sigmoid colon diverticular seen. Vascular/Lymphatic: Diffuse vascular calcifications identified along the aorta and branch vessels. Normal caliber aorta and IVC. No developing new lymph node enlargement identified in the abdomen and pelvis on this noncontrast examination. Reproductive: Prostate is unremarkable. Other: Increasing left hemi abdominal stranding. There is thickening along the tissues surround the descending colon including the lateral conal fascia. Left-sided perinephric stranding. No frank ascites. No definite free air. Musculoskeletal: Scattered degenerative changes identified greatest at L5-S1. There is also disc bulging at multiple lumbar levels. Please correlate for any significant stenosis. IMPRESSION: Slight increase in size of low-density fluid collection left upper quadrant involving tail the pancreas as well as the surrounding tissues extending to the splenic flexure of the colon. Collections are still relatively low in density. No clear signs of acute hematoma. No associated developing air in the collections. In principle infection is difficult to exclude on the basis of CT alone. The collections extending along the spleen itself including subcapsular are similar to previous examination. The  heterogeneous of the spleen could also represent a component of splenic infarcts versus just subcapsular fluid collections. The fluid collection within the tail the pancreas self appears similar to previous. There is increasing wall thickening and stranding along the proximal descending colon extending from the splenic flexure. There is also increasing more proximal colonic dilatation. Cecum has a diameter of over 8 cm. Please correlate for a component of partial obstruction. Please correlate with infectious, ischemic or inflammatory process of the colon. No small bowel dilatation. No widespread free air or ascites. Persistent pleural effusion at the left lung base with adjacent parenchymal opacities. The opacity the right lung base is slightly increasing. Again atelectatic versus infectious is possible. Recommend follow-up. Coronary artery calcifications. Please correlate for other coronary risk factors. Electronically Signed   By: Adrianna Horde M.D.   On: 04/23/2024 15:04    Anti-infectives: Anti-infectives (From admission, onward)    Start     Dose/Rate Route Frequency Ordered Stop   04/19/24 1000  piperacillin -tazobactam (ZOSYN ) IVPB 3.375 g        3.375 g 12.5 mL/hr over 240 Minutes Intravenous Every 8 hours 04/19/24 0854     04/12/24 1000  cefTRIAXone  (ROCEPHIN ) 2 g in sodium chloride  0.9 % 100 mL IVPB  Status:  Discontinued        2 g 200 mL/hr over 30 Minutes Intravenous Daily 04/12/24 0758 04/19/24 0849   04/10/24 2100  metroNIDAZOLE  (FLAGYL ) IVPB 500 mg  Status:  Discontinued        500 mg 100 mL/hr over 60 Minutes Intravenous Every 12 hours 04/10/24 0840 04/19/24 0849   04/10/24 2100  vancomycin  (VANCOCIN ) IVPB 1000 mg/200  mL premix  Status:  Discontinued        1,000 mg 200 mL/hr over 60 Minutes Intravenous Every 12 hours 04/10/24 0940 04/12/24 0758   04/10/24 1600  ceFEPIme  (MAXIPIME ) 2 g in sodium chloride  0.9 % 100 mL IVPB  Status:  Discontinued        2 g 200 mL/hr over 30 Minutes  Intravenous Every 8 hours 04/10/24 0940 04/12/24 0758   04/10/24 0830  metroNIDAZOLE  (FLAGYL ) IVPB 500 mg        500 mg 100 mL/hr over 60 Minutes Intravenous  Once 04/10/24 0819 04/10/24 1222   04/10/24 0800  vancomycin  (VANCOREADY) IVPB 1750 mg/350 mL        1,750 mg 175 mL/hr over 120 Minutes Intravenous  Once 04/10/24 0722 04/10/24 1120   04/10/24 0730  ceFEPIme  (MAXIPIME ) 2 g in sodium chloride  0.9 % 100 mL IVPB        2 g 200 mL/hr over 30 Minutes Intravenous  Once 04/10/24 0722 04/10/24 1430       Assessment/Plan:   58 y/o M with acute on chronic pancreatitis with peripancreatic fluid collections Pancreatic pseudoaneurysm  - IR evaluated and sees a pancreatic tail pseudoaneurysm, likely arising from splenic artery, and ordered a CTA to further evaluate. CTA shows two peripancreatic fluid collections that are felt to be thrombosed due to pseudoaneurysm and drainage/aspiration is not recommended. There are some subcapsular splenic fluid collections that they do not recommend draining. There is an additional fluid collection near the descending colon that is too small for drainage. - IV abx - Per GI - question splenic infarct as other reason for inflammation - no acute surgical need but will follow    Possible partial large bowel obstruction Inflammatory stranding around the proximal colon this secondary to his pancreatitis.  Very mild upstream dilation and is tolerating p.o.  Having bowel movements. Continue with bowel regimen as needed     FEN - FLD, IVF per primary; consider TPN VTE - SCD's ID - Zosyn  5/20>>   - per TRH -   COVID + COPD GERD Spinal stenosis - on lyrica   Tobacco abuse    LOS: 14 days    Brandy Cal Vincent Ehrler MD  04/25/2024  I reviewed Consultant GI notes, hospitalist notes, last 24 h vitals and pain scores, last 48 h intake and output, last 24 h labs and trends, and last 24 h imaging results.   This care required moderate level of medical decision making.

## 2024-04-25 NOTE — Plan of Care (Incomplete)
  Problem: Education: Goal: Knowledge of risk factors and measures for prevention of condition will improve Outcome: Progressing   Problem: Respiratory: Goal: Will maintain a patent airway Outcome: Progressing Goal: Complications related to the disease process, condition or treatment will be avoided or minimized Outcome: Progressing   Problem: Education: Goal: Knowledge of General Education information will improve Description: Including pain rating scale, medication(s)/side effects and non-pharmacologic comfort measures Outcome: Progressing   Problem: Health Behavior/Discharge Planning: Goal: Ability to manage health-related needs will improve Outcome: Progressing   Problem: Clinical Measurements: Goal: Ability to maintain clinical measurements within normal limits will improve Outcome: Progressing Goal: Will remain free from infection Outcome: Progressing Goal: Diagnostic test results will improve Outcome: Progressing Goal: Respiratory complications will improve Outcome: Progressing Goal: Cardiovascular complication will be avoided Outcome: Progressing   Problem: Activity: Goal: Risk for activity intolerance will decrease Outcome: Progressing   Problem: Coping: Goal: Level of anxiety will decrease Outcome: Progressing   Problem: Elimination: Goal: Will not experience complications related to bowel motility Outcome: Progressing Goal: Will not experience complications related to urinary retention Outcome: Progressing   Problem: Pain Managment: Goal: General experience of comfort will improve and/or be controlled Outcome: Progressing   Problem: Safety: Goal: Ability to remain free from injury will improve Outcome: Progressing   Problem: Nutrition: Goal: Adequate nutrition will be maintained Outcome: Not Progressing

## 2024-04-25 NOTE — Progress Notes (Signed)
 Nutrition Follow-up  DOCUMENTATION CODES:   Non-severe (moderate) malnutrition in context of acute illness/injury  INTERVENTION:   Monitor magnesium , potassium, and phosphorus for at least 3 days, MD to replete as needed, as pt is at risk for refeeding syndrome. -Recommend 100 mg Thiamine  daily x 5 days  Once post-pyloric Cortrak placed and verified: -Initiate Osmolite 1.5 @ 20 ml/hr, advance by 10 ml every 12 hours to goal rate of 60 ml/hr. -60 ml Prosource TF 20 daily -Provides 2240 kcals, 110g protein and 1097 ml H2O  -Continue Boost Breeze po TID, each supplement provides 250 kcal and 9 grams of protein as tolerated  NUTRITION DIAGNOSIS:   Moderate Malnutrition related to acute illness as evidenced by mild fat depletion, energy intake < 75% for > 7 days.  Ongoing.  GOAL:   Patient will meet greater than or equal to 90% of their needs  Not meeting.  MONITOR:   PO intake, Supplement acceptance, Diet advancement  REASON FOR ASSESSMENT:   Consult Assessment of nutrition requirement/status, Enteral/tube feeding initiation and management  ASSESSMENT:   58 year old male with history of suspected lung CA, GERD, COPD, chronic pancreatitis with pseudocyst presented to the hospital with cough, abdominal pain.  He was found to have COPD exacerbation in the setting of COVID infection along with acute on chronic pancreatitis complicated by pseudocyst.  RD consulted for tube feeding initiation and management. Per MD note, plan is for post-pyloric Cortrak placement, still awaiting placement. Will leave TF recommendations above, will start once placement verified.   Admission weight: 175 lbs Current weight: 166 lbs  Medications: B complex w/ C, Vitamin B-12, [name] , Multivitamin with minerals daily, D5 infusion  Labs reviewed: Low Phos Elevated Mg CRP 21.3   Diet Order:   Diet Order             Diet full liquid Fluid consistency: Thin  Diet effective now                    EDUCATION NEEDS:   Education needs have been addressed  Skin:  Skin Assessment: Reviewed RN Assessment  Last BM:  5/26 -type 6  Height:   Ht Readings from Last 1 Encounters:  04/10/24 5\' 8"  (1.727 m)    Weight:   Wt Readings from Last 1 Encounters:  04/21/24 75.6 kg    BMI:  Body mass index is 25.35 kg/m.  Estimated Nutritional Needs:   Kcal:  2100-2300  Protein:  115-130g  Fluid:  2.1L/Giannini   Arna Better, MS, RD, LDN Inpatient Clinical Dietitian Contact via Secure chat

## 2024-04-26 ENCOUNTER — Inpatient Hospital Stay (HOSPITAL_COMMUNITY)

## 2024-04-26 ENCOUNTER — Telehealth: Payer: Self-pay

## 2024-04-26 DIAGNOSIS — K8689 Other specified diseases of pancreas: Secondary | ICD-10-CM

## 2024-04-26 DIAGNOSIS — J441 Chronic obstructive pulmonary disease with (acute) exacerbation: Secondary | ICD-10-CM | POA: Diagnosis not present

## 2024-04-26 DIAGNOSIS — R1032 Left lower quadrant pain: Secondary | ICD-10-CM | POA: Diagnosis not present

## 2024-04-26 DIAGNOSIS — K219 Gastro-esophageal reflux disease without esophagitis: Secondary | ICD-10-CM

## 2024-04-26 DIAGNOSIS — D75839 Thrombocytosis, unspecified: Secondary | ICD-10-CM

## 2024-04-26 DIAGNOSIS — J45909 Unspecified asthma, uncomplicated: Secondary | ICD-10-CM | POA: Diagnosis not present

## 2024-04-26 DIAGNOSIS — D649 Anemia, unspecified: Secondary | ICD-10-CM

## 2024-04-26 DIAGNOSIS — K861 Other chronic pancreatitis: Secondary | ICD-10-CM

## 2024-04-26 LAB — RENAL FUNCTION PANEL
Albumin: 2.9 g/dL — ABNORMAL LOW (ref 3.5–5.0)
Anion gap: 9 (ref 5–15)
BUN: 7 mg/dL (ref 6–20)
CO2: 28 mmol/L (ref 22–32)
Calcium: 9 mg/dL (ref 8.9–10.3)
Chloride: 99 mmol/L (ref 98–111)
Creatinine, Ser: 1.01 mg/dL (ref 0.61–1.24)
GFR, Estimated: >60 mL/min (ref >60)
Glucose, Bld: 131 mg/dL — ABNORMAL HIGH (ref 70–99)
Phosphorus: 2.2 mg/dL — ABNORMAL LOW (ref 2.5–4.6)
Potassium: 3.7 mmol/L (ref 3.5–5.1)
Sodium: 136 mmol/L (ref 135–145)

## 2024-04-26 LAB — SEDIMENTATION RATE: Sed Rate: 90 mm/h — ABNORMAL HIGH (ref 0–16)

## 2024-04-26 LAB — CBC
HCT: 31.3 % — ABNORMAL LOW (ref 39.0–52.0)
Hemoglobin: 10 g/dL — ABNORMAL LOW (ref 13.0–17.0)
MCH: 29.1 pg (ref 26.0–34.0)
MCHC: 31.9 g/dL (ref 30.0–36.0)
MCV: 91 fL (ref 80.0–100.0)
Platelets: 725 10*3/uL — ABNORMAL HIGH (ref 150–400)
RBC: 3.44 MIL/uL — ABNORMAL LOW (ref 4.22–5.81)
RDW: 16.7 % — ABNORMAL HIGH (ref 11.5–15.5)
WBC: 20.7 10*3/uL — ABNORMAL HIGH (ref 4.0–10.5)
nRBC: 0 % (ref 0.0–0.2)

## 2024-04-26 LAB — C-REACTIVE PROTEIN: CRP: 18 mg/dL — ABNORMAL HIGH (ref <1.0)

## 2024-04-26 MED ORDER — LIDOCAINE VISCOUS HCL 2 % MT SOLN
OROMUCOSAL | Status: AC
  Filled 2024-04-26: qty 15

## 2024-04-26 NOTE — Addendum Note (Signed)
 Addended by: Nathalya Wolanski N on: 04/26/2024 01:59 PM   Modules accepted: Orders

## 2024-04-26 NOTE — Progress Notes (Signed)
 Mobility Specialist - Progress Note   04/26/24 1339  Mobility  Activity Ambulated independently in hallway  Level of Assistance Independent  Assistive Device None  Distance Ambulated (ft) 750 ft  Activity Response Tolerated well  Mobility Referral Yes  Mobility visit 1 Mobility  Mobility Specialist Start Time (ACUTE ONLY) 1314  Mobility Specialist Stop Time (ACUTE ONLY) 1338  Mobility Specialist Time Calculation (min) (ACUTE ONLY) 24 min   Pt received in bed and agreeable to mobility. C/o pain during session. RN made aware. No other complaints during session. Pt to bathroom after session with all needs met. RN in room.     [facility]

## 2024-04-26 NOTE — Progress Notes (Signed)
 Subjective/Chief Complaint: Pt bloated but no vomiting .Had BM Sunday  Reports majority of his pain is in his left abdomen and flank but he also has SP tenderness. Rates his pain as stable - no better, no worse compared to admission. Denies urinary sxs.   WBC 20 from 35 yesterday   Objective: Vital signs in last 24 hours: Temp:  [98 F (36.7 C)-98.7 F (37.1 C)] 98 F (36.7 C) (05/27 0502) Pulse Rate:  [79-98] 79 (05/27 0502) Resp:  [17-18] 18 (05/27 0502) BP: (109-124)/(70-91) 124/91 (05/27 0502) SpO2:  [95 %-99 %] 97 % (05/27 0918) Last BM Date : 04/25/24  Intake/Output from previous Shear: 05/26 0701 - 05/27 0700 In: 600 [P.O.:600] Out: -  Intake/Output this shift: Total I/O In: 120 [P.O.:120] Out: -   Abd: soft, moderately distended, diffusely tender without guarding, prior surgical scars noted    Lab Results:  Recent Labs    04/25/24 0459 04/26/24 0432  WBC 35.6* 20.7*  HGB 9.6* 10.0*  HCT 29.5* 31.3*  PLT 712* 725*   BMET Recent Labs    04/25/24 0459 04/26/24 0432  NA 136 136  K 3.9 3.7  CL 98 99  CO2 26 28  GLUCOSE 181* 131*  BUN 6 7  CREATININE 0.83 1.01  CALCIUM 8.6* 9.0   PT/INR No results for input(s): "LABPROT", "INR" in the last 72 hours. ABG No results for input(s): "PHART", "HCO3" in the last 72 hours.  Invalid input(s): "PCO2", "PO2"  Studies/Results: No results found.   Anti-infectives: Anti-infectives (From admission, onward)    Start     Dose/Rate Route Frequency Ordered Stop   04/19/24 1000  piperacillin-tazobactam (ZOSYN) IVPB 3.375 g        3.375 g 12.5 mL/hr over 240 Minutes Intravenous Every 8 hours 04/19/24 0854     04/12/24 1000  cefTRIAXone (ROCEPHIN) 2 g in sodium chloride 0.9 % 100 mL IVPB  Status:  Discontinued        2 g 200 mL/hr over 30 Minutes Intravenous Daily 04/12/24 0758 04/19/24 0849   04/10/24 2100  metroNIDAZOLE (FLAGYL) IVPB 500 mg  Status:  Discontinued        500 mg 100 mL/hr over 60 Minutes  Intravenous Every 12 hours 04/10/24 0840 04/19/24 0849   04/10/24 2100  vancomycin (VANCOCIN) IVPB 1000 mg/200 mL premix  Status:  Discontinued        1,000 mg 200 mL/hr over 60 Minutes Intravenous Every 12 hours 04/10/24 0940 04/12/24 0758   04/10/24 1600  ceFEPIme (MAXIPIME) 2 g in sodium chloride 0.9 % 100 mL IVPB  Status:  Discontinued        2 g 200 mL/hr over 30 Minutes Intravenous Every 8 hours 04/10/24 0940 04/12/24 0758   04/10/24 0830  metroNIDAZOLE (FLAGYL) IVPB 500 mg        50 0 mg 100 mL/hr over 60 Minutes Intravenous  Once 04/10/24 0819 04/10/24 1222   04/10/24 0800  vancomycin  (VANCOREADY) IVPB 1750 mg/350 mL        1,750 mg 175 mL/hr over 120 Minutes Intravenous  Once 04/10/24 0722 04/10/24 1120   04/10/24 0730  ceFEPIme  (MAXIPIME ) 2 g in sodium chloride  0.9 % 100 mL IVPB        2 g 200 mL/hr over 30 Minutes Intravenous  Once 04/10/24 0722 04/10/24 1430       Assessment/Plan:   58 y/o M with acute on chronic pancreatitis with peripancreatic fluid collections Pancreatic pseudoaneurysm  - IR evaluated and  sees a pancreatic tail pseudoaneurysm, likely arising from splenic artery, and ordered a CTA to further evaluate. CTA shows two peripancreatic fluid collections that are felt to be thrombosed due to pseudoaneurysm and drainage/aspiration is not recommended. There are some subcapsular splenic fluid collections that they do not recommend draining. There is an additional fluid collection near the descending colon that is too small for drainage. - he remains on IV Zosyn . I agree with GI and I think these can be stopped because colitis at the splenic flexure is most likely reactive and not infectious. - Per GI - question splenic infarct as other reason for inflammation - no acute surgical need but will follow    Possible partial large bowel obstruction Inflammatory stranding around the proximal colon this secondary to his pancreatitis.  Very mild upstream dilation and is  tolerating p.o.  Having bowel movements. Continue with bowel regimen as needed     FEN - FLD, IVF per primary; noted plans for cortrak.  VTE - SCD's ID - Zosyn  5/20>>; ok to stop abx and follow clinically     - per TRH -   COVID + COPD GERD Spinal stenosis - on lyrica   Tobacco abuse    LOS: 15 days    Charlott Converse PA-C 04/26/2024  I reviewed Consultant GI notes, hospitalist notes, last 24 h vitals and pain scores, last 48 h intake and output, last 24 h labs and trends, and last 24 h imaging results.   This care required moderate level of medical decision making.

## 2024-04-26 NOTE — Progress Notes (Signed)
 Attempted Cortrak placement. Tube passed easily and patient tolerated extremely well, however, resistance was met at approximately 45cm. Cortrak followed the midline and patient experienced no respiratory distress. Gave patient a few sips of liquid to attempt to pass tube. Resistance did not decrease. Cortrak removed and left at bedside, bedside RN aware.

## 2024-04-26 NOTE — Telephone Encounter (Signed)
-----   Message from [facility] sent at 04/22/2024  4:37 PM EDT ----- Regarding: Hospital follow-up Pod B nurses, This patient will hopefully be discharged over the course the next few days. He needs a CT abdomen with IV and oral contrast to be done in 4 weeks. Diagnosis pancreatic duct dilation, chronic pancreatitis. This will help us  understand whether he needs an endoscopic ultrasound and based on his pain/discomfort would be considered for endoscopic ultrasound and possible celiac block at that time. He is going to likely discharge this long weekend, so if possible to place the order before the weekend that is fine otherwise please place order early next week.  You may place this under Dr. Andre Kawasaki name or my name if necessary. Thanks. GM

## 2024-04-26 NOTE — Telephone Encounter (Signed)
 CT order in epic. Secure staff message sent to radiology scheduling to contact patient to schedule repeat CT scan around 05/24/24.   MyChart message sent to patient with recommendations.

## 2024-04-26 NOTE — Progress Notes (Signed)
 [facility]                                                                              Wesley [name], is a 58 y.o. male, DOB - 1965-12-29, ZOX:[id number] Admit date - 04/10/2024    Outpatient Primary MD for the patient is [name], DO  LOS - 15  days  Chief Complaint  Patient presents with   Shortness of Breath       Brief summary   Patient is a 58 year old male with history of suspected lung CA, GERD, COPD, chronic pancreatitis with pseudocyst presented to the hospital with cough, abdominal pain.  He was found to have COPD exacerbation in the setting of COVID infection along with acute on chronic pancreatitis complicated by pseudocyst. He was placed on IV fluids, pain control, bronchodilators, diet was advanced On 5/17, patient noted to have abdominal distention with likely ileus, no SBO or obstruction.   Assessment & Plan   Acute on chronic pancreatitis, intra-abdominal fluid collections, pseudocyst, pseudoaneurysm - CT abdomen on 5/19 showed perisplenic fluid collection, another fluid collection near the diaphragm, wall thickening and inflammation of the splenic flexure of the colon,  largest collection adjacent to the colon, 2.3x 4.4x 5.9 cm -NGT out, continue IV Zosyn  - GI following, no role for stenting, no plan for EGD or colonoscopy.  Percutaneous drainage may be beneficial at some point, defer to IR when it would be amenable to drainage.  Repeat imaging in 5 to 7 days.  - Per GI, surgery, continue supportive care, antibiotics, pain control, poor p.o. intake - RN unable to place core track, IR consulted for core track for postpyloric feeding   Possible partial large bowel obstruction - CT abdomen showed 2.3 x 4.4 x 5.9 cm collection involving the hepatic flexure with proximal dilation and air fluid levels of the colon, patient could be partially obstructed at this level.  Patient also had severe constipation, ileus, abdominal distention, NGT was  placed - NGT out once patient started having BMs and liquid diet  -On 5/25, pain and distention much worse, improved with smog enema, had multiple BMs. - Continue bowel regimen  descending colitis -Likely reactive or ischemic colitis.  - Will likely need colonoscopy once acute inflammation improves - Continue IV Zosyn   COVID infection - COVID was positive on 04/10/2024, currently no hypoxia or shortness of breath -Airborne precautions removed   COPD exacerbation, - Improved, no wheezing - Continue bronchodilators  RUL nodule, suspected lung Ca - Patient had a PET scan, last 2 biopsies were nondiagnostic, will need to follow-up outpatient.  Has seen radiation oncology recently and was recommended repeating another CT scan in 6 months  GERD/gastritis - Continue PPI   intermittent blurry vision - MRI brain is negative, recommended outpatient follow-up with ophthalmology  Hyponatremia - Improving  Hypokalemia Replace as needed   Interventions: Boost Breeze, MVI, Education  Estimated body mass index is 25.35 kg/m as calculated from the following:   Height as of this encounter: 5\' 8"  (1.727 m).   Weight as of this encounter: 75.6 kg.  Code Status: Full code DVT Prophylaxis:  Place and maintain sequential  compression device Start: 04/21/24 0558   Level of Care: Level of care: Telemetry Family Communication: Updated patient Disposition Plan:      Remains inpatient appropriate:   Once tolerating solid diet, cleared by GI and surgery   Procedures:   Consultants:   General Surgery GI Intervention radiology  Antimicrobials:   Anti-infectives (From admission, onward)    Start     Dose/Rate Route Frequency Ordered Stop   04/19/24 1000  piperacillin -tazobactam (ZOSYN ) IVPB 3.375 g        3.375 g 12.5 mL/hr over 240 Minutes Intravenous Every 8 hours 04/19/24 0854     04/12/24 1000  cefTRIAXone  (ROCEPHIN ) 2 g in sodium chloride  0.9 % 100 mL IVPB  Status:  Discontinued         2 g 200 mL/hr over 30 Minutes Intravenous Daily 04/12/24 0758 04/19/24 0849   04/10/24 2100  metroNIDAZOLE  (FLAGYL ) IVPB 500 mg  Status:  Discontinued        500 mg 100 mL/hr over 60 Minutes Intravenous Every 12 hours 04/10/24 0840 04/19/24 0849   04/10/24 2100  vancomycin  (VANCOCIN ) IVPB 1000 mg/200 mL premix  Status:  Discontinued        1,000 mg 200 mL/hr over 60 Minutes Intravenous Every 12 hours 04/10/24 0940 04/12/24 0758   04/10/24 1600  ceFEPIme  (MAXIPIME ) 2 g in sodium chloride  0.9 % 100 mL IVPB  Status:  Discontinued        2 g 200 mL/hr over 30 Minutes Intravenous Every 8 hours 04/10/24 0940 04/12/24 0758   04/10/24 0830  metroNIDAZOLE  (FLAGYL ) IVPB 500 mg        500 mg 100 mL/hr over 60 Minutes Intravenous  Once 04/10/24 0819 04/10/24 1222   04/10/24 0800  vancomycin  (VANCOREADY) IVPB 1750 mg/350 mL        1,750 mg 175 mL/hr over 120 Minutes Intravenous  Once 04/10/24 0722 04/10/24 1120   04/10/24 0730  ceFEPIme  (MAXIPIME ) 2 g in sodium chloride  0.9 % 100 mL IVPB        2 g 200 mL/hr over 30 Minutes Intravenous  Once 04/10/24 0722 04/10/24 1430          Medications  amitriptyline   50 mg Oral QHS   B-complex with vitamin C  1 tablet Oral Daily   cyanocobalamin   1,000 mcg Oral Daily   feeding supplement  1 Container Oral TID BM   fluticasone  furoate-vilanterol  1 puff Inhalation Daily   lipase/protease/amylase  72,000 Units Oral TID WC   And   lipase/protease/amylase  36,000 Units Oral With snacks   midodrine  10 mg Oral TID WC   multivitamin with minerals  1 tablet Oral Daily   pantoprazole   40 mg Oral BID   polyethylene glycol  17 g Oral Daily   pregabalin   150 mg Oral TID   umeclidinium bromide   1 puff Inhalation Daily      Subjective:   Wesley [name] was seen and examined today.  Appears somewhat comfortable today, still has abdominal pain, BM yesterday but not today yet.  No acute nausea vomiting, fevers or chills. Poor p.o. intake, only sips of  drinks.    Objective:   Vitals:   04/25/24 1432 04/25/24 2012 04/26/24 0502 04/26/24 0918  BP: 109/73 116/70 (!) 124/91   Pulse: 79 98 79   Resp: 17 18 18    Temp: 98.7 F (37.1 C) 98.3 F (36.8 C) 98 F (36.7 C)   TempSrc: Oral Oral Oral   SpO2: 99%  95% 99% 97%  Weight:      Height:        Intake/Output Summary (Last 24 hours) at 04/26/2024 1126 Last data filed at 04/26/2024 1000 Gross per 24 hour  Intake 480 ml  Output --  Net 480 ml     Wt Readings from Last 3 Encounters:  04/21/24 75.6 kg  03/10/24 79.9 kg  12/29/23 75.8 kg   Physical Exam General: Alert and oriented x 3, NAD Cardiovascular: S1 S2 clear, RRR.  Respiratory: CTAB, no wheezing, rales or rhonchi Gastrointestinal: Soft, mild to moderate distention, diffuse TTP, + BS  Ext: no pedal edema bilaterally Neuro: no new deficits Psych: Normal affect    Data Reviewed:  I have personally reviewed following labs    CBC Lab Results  Component Value Date   WBC 20.7 (H) 04/26/2024   RBC 3.44 (L) 04/26/2024   HGB 10.0 (L) 04/26/2024   HCT 31.3 (L) 04/26/2024   MCV 91.0 04/26/2024   MCH 29.1 04/26/2024   PLT 725 (H) 04/26/2024   MCHC 31.9 04/26/2024   RDW 16.7 (H) 04/26/2024   LYMPHSABS 2.2 04/18/2024   MONOABS 1.6 (H) 04/18/2024   EOSABS 0.0 04/18/2024   BASOSABS 0.0 04/18/2024     Last metabolic panel Lab Results  Component Value Date   NA 136 04/26/2024   K 3.7 04/26/2024   CL 99 04/26/2024   CO2 28 04/26/2024   BUN 7 04/26/2024   CREATININE 1.01 04/26/2024   GLUCOSE 131 (H) 04/26/2024   GFRNONAA >60 04/26/2024   GFRAA 106 10/08/2020   CALCIUM 9.0 04/26/2024   PHOS 2.2 (L) 04/26/2024   PROT 6.8 04/25/2024   ALBUMIN 2.9 (L) 04/26/2024   LABGLOB 3.9 09/17/2022   AGRATIO 0.9 (L) 09/17/2022   BILITOT 1.7 (H) 04/25/2024   ALKPHOS 204 (H) 04/25/2024   AST 71 (H) 04/25/2024   ALT 49 (H) 04/25/2024   ANIONGAP 9 04/26/2024    CBG (last 3)  No results for input(s): "GLUCAP" in the last  72 hours.    Coagulation Profile: No results for input(s): "INR", "PROTIME" in the last 168 hours.   Radiology Studies: I have personally reviewed the imaging studies  No results found.       Bertram Brocks M.D. [facility] 04/26/2024, 11:26 AM  Available via Epic secure chat 7am-7pm After 7 pm, please refer to night coverage provider listed on amion.

## 2024-04-26 NOTE — Progress Notes (Addendum)
 [facility] Progress Note  CC:  acute on chronic pancreatitis with recurrent pseudocyst   Subjective: He continues to have LLQ pain, rated a 9 on scale of 1 to 10 which decreased after he received Dilaudid  1mg  IV but he stated his abdominal pain is increasing within 1 1/2 hours of receiving pain med. He is tolerating a clear liquid diet. No N/V. RN attempted placement of Cortrak tube which was not successful.  He endorses passing loose stools daily, no bloody or black stools.  He denies having any chest pain or shortness of breath.  No family at the bedside.   Objective:  Vital signs in last 24 hours: Temp:  [98 F (36.7 C)-98.7 F (37.1 C)] 98 F (36.7 C) (05/27 0502) Pulse Rate:  [79-98] 79 (05/27 0502) Resp:  [17-18] 18 (05/27 0502) BP: (109-124)/(70-91) 124/91 (05/27 0502) SpO2:  [95 %-99 %] 97 % (05/27 0918) Last BM Date : 04/25/24 General: 58 year old male in no acute distress. Heart: Regular rate and rhythm, no murmurs. Pulm: Sounds slightly coarse with a few crackles in the bases.  On oxygen 3-1/2 L nasal cannula. Abdomen: Protuberant with mild distention.  Abdomen is not tense.  Hypoactive bowel sounds all 4 quadrants.  Tenderness to the LLQ without rebound or guarding.  No bruit.  No palpable mass. Extremities: No edema. Neurologic:  Alert and oriented x 4.  Speech is clear.  Moves all extremities. Psych:  Alert and cooperative. Normal mood and affect.  Intake/Output from previous [place]: 05/26 0701 - 05/27 0700 In: 600 [P.O.:600] Out: -  Intake/Output this shift: Total I/O In: 120 [P.O.:120] Out: -   Lab Results: Recent Labs    04/25/24 0459 04/26/24 0432  WBC 35.6* 20.7*  HGB 9.6* 10.0*  HCT 29.5* 31.3*  PLT 712* 725*   BMET Recent Labs    04/25/24 0459 04/26/24 0432  NA 136 136  K 3.9 3.7  CL 98 99  CO2 26 28  GLUCOSE 181* 131*  BUN 6 7  CREATININE 0.83 1.01  CALCIUM 8.6* 9.0   LFT Recent Labs    04/25/24 0459 04/26/24 0432   PROT 6.8  --   ALBUMIN 3.0*  3.0* 2.9*  AST 71*  --   ALT 49*  --   ALKPHOS 204*  --   BILITOT 1.7*  --   BILIDIR 0.9*  --   IBILI 0.8  --    PT/INR No results for input(s): "LABPROT", "INR" in the last 72 hours. Hepatitis Panel No results for input(s): "HEPBSAG", "HCVAB", "HEPAIGM", "HEPBIGM" in the last 72 hours.  No results found.  Patient Profile: Wesley [name] is a 58 y.o. male with a pmh significant for COPD, prior hepatitis C, chronic pancreatitis (secondary to alcohol), recurrent pseudocyst, GERD, chronic abdominal pain.  Patient admitted 04/10/2024 with hypoxic respiratory failure from COVID and subsequently with pancreatitis and progressive peripancreatic fluid collection leading to concern of partial large bowel obstruction.  Assessment / Plan:   Pancreatitis with expanding pseudocyst and fluid collection. CTAP 04/23/2024 showed a slight increase in size of low-density fluid collection to the LUQ involving the tail of the pancreas and surrounding tissue extending to the splenic flexure of the colon, increasing wall thickening and stranding along the proximal descending colon extending splenic flexure with proximal colonic dilatation and the diameter of the cecum measured over 8cm concerning for partial colon obstruction. Per Dr. Brice Campi, overall case is suggestive of inflammatory component within the descending colon from peripancreatic fluid that  is still immature for any attempt at cystenterostomy and likely from any attempt for IR drainage. Evaluated by general  surgery, no indication for surgical intervention. On Zosyn  IV. WBC 35.6 -> 20.7. Sed rate 90. CRP 18. Unsuccessful core track placement attempted earlier today. -Full liquid diet -Dr. Thelma Fire to contact IR to consider postpyloric feeding tube placement under fluoroscopy -Pain management per the hospitalist -Follow up CTAP 5/31 or earlier if abdominal pain or distension worsens  -He may eventually require a cystenterostomy   -Continue [name]  72 K tid with meals and 36K with snacks -Continue Miralax   -Colonoscopy deferred, low suspicion for colon malignancy, colonoscopy 12/24/2022 showed 2 sessile serrated/tubular adenomatous polyps removed from the colon and sigmoid biopsy showed inactive chronic nonspecific colitis without dysplasia - Await further recommendations per Dr. Venice Gillis  CTAP/CTA  identified two peripancreatic fluid collections that are likely thrombosed due to pseudoaneurysm and drainage was not recommended   Normocytic anemia. No overt GI bleeding.  Thrombocytosis. PLT 725.   GERD - Continue Pantoprazole  40 mg daily  COPD, on oxygen 3.5 L [place].  SARS coronavirus positive 04/10/2024.  CT 04/23/2024 showed persistent left lung pleural effusion.     Principal Problem:   Peripancreatic fluid collection Active Problems:   Abdominal distention   Drug-induced constipation   Dilation of pancreatic duct   Acute on chronic pancreatitis (HCC)   Malnutrition of moderate degree   Chronic abdominal pain   History of pancreatitis   Other partial intestinal obstruction (HCC)   Acute pancreatic fluid collection     LOS: 15 days   Wesley [name]  04/26/2024, 12:32 PM   Attending physician's note   I have taken history, reviewed the chart and examined the patient. I performed a substantive portion of this encounter, including complete performance of at least one of the key components, in conjunction with the APP. I agree with the Advanced Practitioner's note, impression and recommendations.   Got signout from Dr Brice Campi  Acute on chronic pancreatitis with enlarging immature pseudocysts/inflammation in LUQ with resultant large bowel ileus/obstruction. Neg colon 2024 except for small polyps.  Failed attempt at bedside coretrak tube placement  Adm with COVID respiratory failure with underlying COPD.  Plan: -IR core-trak tube placement and enteral feeds. -Cyst wall very immature and no window  for cystgastrostomy per Dr Brice Campi -Clear liq diet. OK for ensure. -Serial x-ray KUBs. Pt did pass significant flatus this PM and feels better. Minimize pain meds, ambulate, keep K>4, Mg>2 -Continue Zosyn  for now. -Hold [name]  until able to tolerate PO -Rpt CT with contrast (dedicated pancreatic protocol) 5/29 -Will follow along.     Magnus Schuller, MD [facility] [phone]

## 2024-04-27 ENCOUNTER — Inpatient Hospital Stay (HOSPITAL_COMMUNITY)

## 2024-04-27 DIAGNOSIS — K8689 Other specified diseases of pancreas: Secondary | ICD-10-CM | POA: Diagnosis not present

## 2024-04-27 LAB — CBC
HCT: 31.6 % — ABNORMAL LOW (ref 39.0–52.0)
Hemoglobin: 10 g/dL — ABNORMAL LOW (ref 13.0–17.0)
MCH: 28.3 pg (ref 26.0–34.0)
MCHC: 31.6 g/dL (ref 30.0–36.0)
MCV: 89.5 fL (ref 80.0–100.0)
Platelets: 702 10*3/uL — ABNORMAL HIGH (ref 150–400)
RBC: 3.53 MIL/uL — ABNORMAL LOW (ref 4.22–5.81)
RDW: 16.8 % — ABNORMAL HIGH (ref 11.5–15.5)
WBC: 18.7 10*3/uL — ABNORMAL HIGH (ref 4.0–10.5)
nRBC: 0 % (ref 0.0–0.2)

## 2024-04-27 LAB — RENAL FUNCTION PANEL
Albumin: 2.8 g/dL — ABNORMAL LOW (ref 3.5–5.0)
Anion gap: 8 (ref 5–15)
BUN: 7 mg/dL (ref 6–20)
CO2: 26 mmol/L (ref 22–32)
Calcium: 9 mg/dL (ref 8.9–10.3)
Chloride: 98 mmol/L (ref 98–111)
Creatinine, Ser: 1.16 mg/dL (ref 0.61–1.24)
GFR, Estimated: >60 mL/min (ref >60)
Glucose, Bld: 119 mg/dL — ABNORMAL HIGH (ref 70–99)
Phosphorus: 2.5 mg/dL (ref 2.5–4.6)
Potassium: 4 mmol/L (ref 3.5–5.1)
Sodium: 132 mmol/L — ABNORMAL LOW (ref 135–145)

## 2024-04-27 MED ORDER — BISACODYL 10 MG RE SUPP
10.0000 mg | Freq: Every day | RECTAL | Status: DC | PRN
  Filled 2024-04-27: qty 1

## 2024-04-27 MED ORDER — POLYETHYLENE GLYCOL 3350 17 G PO PACK
17.0000 g | PACK | Freq: Two times a day (BID) | ORAL | Status: DC
  Administered 2024-04-27 – 2024-05-03 (×10): 17 g via ORAL
  Filled 2024-04-27 (×13): qty 1

## 2024-04-27 MED ORDER — BISACODYL 10 MG RE SUPP
10.0000 mg | Freq: Every day | RECTAL | Status: AC
  Administered 2024-04-27 – 2024-04-29 (×3): 10 mg via RECTAL
  Filled 2024-04-27 (×2): qty 1

## 2024-04-27 NOTE — TOC Progression Note (Signed)
 Transition of Care [facility]) - Progression Note    Patient Details  Name: Marisa Hufstetler Schmoll MRN: [id number] Date of Birth: 1966/07/13  [facility] CM/SW Contact  Urias Sheek, Thersia Flax, RN Phone Number: 04/27/2024, 11:38 AM  Clinical Narrative: d/c plan home.      Expected Discharge Plan: Home/Self Care Barriers to Discharge: Continued Medical Work up  Expected Discharge Plan and Services   Discharge Planning Services: CM Consult   Living arrangements for the past 2 months: Single Family Home                 DME Arranged: N/A DME Agency: NA       HH Arranged: NA HH Agency: NA         Social Determinants of Health (SDOH) Interventions SDOH Screenings   Food Insecurity: No Food Insecurity (04/10/2024)  Housing: Low Risk  (04/10/2024)  Transportation Needs: No Transportation Needs (04/10/2024)  Utilities: Not At Risk (04/10/2024)  Alcohol Screen: Low Risk  (04/06/2023)  Depression (PHQ2-9): Low Risk  (12/15/2023)  Financial Resource Strain: Medium Risk (04/06/2023)  Physical Activity: Insufficiently Active (04/06/2023)  Social Connections: Socially Isolated (12/15/2023)  Stress: Stress Concern Present (04/06/2023)  Tobacco Use: High Risk (04/10/2024)    Readmission Risk Interventions     No data to display
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
## 2024-04-27 NOTE — Progress Notes (Signed)
 Nutrition Follow-up  DOCUMENTATION CODES:   Non-severe (moderate) malnutrition in context of acute illness/injury  INTERVENTION:  - Per GI and Hospitalist MD, holding off on starting tube feeds until Cortrak advanced post pyloric by IR.   - Once Cortrak advanced post-pyloric and placement verified, recommend: Osmolite 1.5 starting @ 20 ml/hr, advancing by 10 ml every 12 hours to goal rate of 60 ml/hr. -60 ml Prosource TF 20 daily -Provides 2240 kcals, 110g protein and 1097 ml H2O   Monitor magnesium , potassium, and phosphorus for at least 3 days, MD to replete as needed, as pt is at risk for refeeding syndrome. -Recommend 100 mg Thiamine  daily x 5 days  -Continue Boost Breeze po TID, each supplement provides 250 kcal and 9 grams of protein as tolerated   NUTRITION DIAGNOSIS:   Moderate Malnutrition related to acute illness as evidenced by mild fat depletion, energy intake < 75% for > 7 days. *ongoing  GOAL:   Patient will meet greater than or equal to 90% of their needs *progressing, starting TF  MONITOR:   PO intake, Supplement acceptance, Diet advancement  REASON FOR ASSESSMENT:   Consult Assessment of nutrition requirement/status, Enteral/tube feeding initiation and management  ASSESSMENT:   58 year old male with history of suspected lung CA, GERD, COPD, chronic pancreatitis with pseudocyst presented to the hospital with cough, abdominal pain.  He was found to have COPD exacerbation in the setting of COVID infection along with acute on chronic pancreatitis complicated by pseudocyst.  Patient had Cortrak placed by IR yesterday but abdominal xray this AM shows tube is kinked on itself and loops in the stomach. Tube not post-pyloric.   Discussed with GI and Hospitalist MD. Given pancreatitis and admit for respiratory issues with aspiration risk, plan to re-consult IR to advance tube post-pyloric. Holding off on starting tube feeds until tube advanced and placement  verified post-pyloric.  Patient sleeping at time of visit. No oral intake documented since 5/23. [name]  stopped yesterday. GI note states to hold until tolerating PO.   Admission weight: 175 lbs Current weight: 166 lbs  Medications: B complex w/ C, Vitamin B-12, Multivitamin with minerals daily, Miralax    Labs reviewed: Na 132  Diet Order:   Diet Order             Diet full liquid Fluid consistency: Thin  Diet effective now                   EDUCATION NEEDS:  Education needs have been addressed  Skin:  Skin Assessment: Reviewed RN Assessment  Last BM:  5/26  Height:  Ht Readings from Last 1 Encounters:  04/10/24 5\' 8"  (1.727 m)   Weight:  Wt Readings from Last 1 Encounters:  04/21/24 75.6 kg    BMI:  Body mass index is 25.35 kg/m.  Estimated Nutritional Needs:  Kcal:  2100-2300 Protein:  115-130g Fluid:  2.1L/Bess     Scheryl Cushing RD, LDN Contact via Secure Chat.

## 2024-04-27 NOTE — Progress Notes (Addendum)
 [facility] Progress Note  CC:  Acute on chronic pancreatitis with recurrent pseudocyst   Subjective: He has not eaten breakfast, he stated his nurses and him a boost supplement to drink and he is tolerating broth and ginger ale.  He endorses having left chest pain when he swallows or when taking a deep breath which started shortly after a feeding tube was placed per IR yesterday.  No BM yesterday or today.  He passed gas per the rectum yesterday, no gas per the rectum thus far this morning.  RN at the bedside.   Objective:  Vital signs in last 24 hours: Temp:  [98 F (36.7 C)-98.7 F (37.1 C)] 98.7 F (37.1 C) (05/28 0417) Pulse Rate:  [82-90] 82 (05/28 0417) Resp:  [15-18] 15 (05/28 0417) BP: (129-137)/(83-90) 129/83 (05/28 0417) SpO2:  [96 %-97 %] 97 % (05/28 0755) Last BM Date : 04/25/24 General: 58 year old male fatigued appearing in no acute distress. Heart: Regular rate and rhythm, no murmurs. Pulm: Breath sounds clear with crackles in the right base.  On oxygen 3 L nasal cannula. Abdomen: Protuberant with mild distention. Abdomen is not tense. Tenderness to the epigastric, LUQ and LLQ without rebound or guarding.  Gurgling bowel sounds throughout all 4 quadrants. No bruit.  No palpable mass. Extremities:  No edema. Neurologic:  Alert and  oriented x 4. Grossly normal neurologically. Psych:  Alert and cooperative. Normal mood and affect.  Intake/Output from previous [facility]: 05/27 0701 - 05/28 0700 In: 830 [P.O.:480; IV Piggyback:350] Out: -  Intake/Output this shift: Total I/O In: 240 [P.O.:240] Out: -   Lab Results: Recent Labs    04/25/24 0459 04/26/24 0432 04/27/24 0500  WBC 35.6* 20.7* 18.7*  HGB 9.6* 10.0* 10.0*  HCT 29.5* 31.3* 31.6*  PLT 712* 725* 702*   BMET Recent Labs    04/25/24 0459 04/26/24 0432 04/27/24 0500  NA 136 136 132*  K 3.9 3.7 4.0  CL 98 99 98  CO2 26 28 26   GLUCOSE 181* 131* 119*  BUN 6 7 7   CREATININE 0.83  1.01 1.16  CALCIUM 8.6* 9.0 9.0   LFT Recent Labs    04/25/24 0459 04/26/24 0432 04/27/24 0500  PROT 6.8  --   --   ALBUMIN  3.0*  3.0*   < > 2.8*  AST 71*  --   --   ALT 49*  --   --   ALKPHOS 204*  --   --   BILITOT 1.7*  --   --   BILIDIR 0.9*  --   --   IBILI 0.8  --   --    < > = values in this interval not displayed.   PT/INR No results for input(s): "LABPROT", "INR" in the last 72 hours. Hepatitis Panel No results for input(s): "HEPBSAG", "HCVAB", "HEPAIGM", "HEPBIGM" in the last 72 hours.  No results found.  Patient Profile: Mr. Mathieson is a 58 y.o. male with a pmh significant for COPD, prior hepatitis C, chronic pancreatitis (secondary to alcohol), recurrent pseudocyst, GERD, chronic abdominal pain.  Patient admitted 04/10/2024 with hypoxic respiratory failure from COVID and subsequently with pancreatitis and progressive peripancreatic fluid collection leading to concern of partial large bowel obstruction.   Assessment / Plan:  Pancreatitis with expanding pseudocyst and fluid collection. CTAP 04/23/2024 showed a slight increase in size of low-density fluid collection to the LUQ involving the tail of the pancreas and surrounding tissue extending to the splenic flexure of the colon, increasing  wall thickening and stranding along the proximal descending colon extending splenic flexure with proximal colonic dilatation and the diameter of the cecum measured over 8cm concerning for partial colon obstruction. Per Dr. Brice Campi, overall case is suggestive of inflammatory component within the descending colon from peripancreatic fluid that is still immature for any attempt at cystenterostomy and likely from any attempt for IR drainage. Evaluated by general  surgery, no indication for surgical intervention. On Zosyn  IV. WBC 35.6 -> 20.7 -> 18.7. Sed rate 90. CRP 18.  Postpyloric feeding tube placed by IR 5/27. Patient endorses having left chest pain when he swallows or takes a deep breath  then which started after feeding tube was placed. Hemodynamically stable. -Chest x-ray and abdominal x-ray -I sent Dr. Mikle Alexanders a secure chat message regarding patient's c/o chest pain, defer further management to her. RN instructed to contact Dr. Mikle Alexanders regarding chest pain as well.  -Await RD recommendations for postpyloric tube feeding -Full liquid diet -Pain management per the medical service  -Follow up CTAP Thursday or Friday -He may eventually require a cystenterostomy  -Hold [name]  if not eating  -Continue Miralax  QD -Colonoscopy deferred, low suspicion for colon malignancy, colonoscopy 12/24/2022 showed 2 sessile serrated/tubular adenomatous polyps removed from the colon and sigmoid biopsy showed inactive chronic nonspecific colitis without dysplasia - Await further recommendations per Dr. Venice Gillis   CTAP/CTA  identified two peripancreatic fluid collections that are likely thrombosed due to pseudoaneurysm and drainage was not recommended    Normocytic anemia. No overt GI bleeding.   Thrombocytosis. PLT 725 -> 702.   Hyponatremia. Na+ 132. -Management per the medical service   GERD - Continue Pantoprazole  40 mg daily   COPD, on oxygen 3 L [place].  SARS coronavirus positive 04/10/2024.  CT 04/23/2024 showed persistent left lung pleural effusion.       Principal Problem:   Peripancreatic fluid collection Active Problems:   Abdominal distention   Drug-induced constipation   Dilation of pancreatic duct   Acute on chronic pancreatitis (HCC)   Malnutrition of moderate degree   Chronic abdominal pain   History of pancreatitis   Other partial intestinal obstruction (HCC)   Acute pancreatic fluid collection     LOS: 16 days   Colleen M Kennedy-Smith  04/27/2024, 10: 18 AM   Attending physician's note   I have taken history, reviewed the chart and examined the patient. I performed a substantive portion of this encounter, including complete performance of at least one of the key  components, in conjunction with the APP. I agree with the Advanced Practitioner's note, impression and recommendations.   Coretrak tube in stomach. Had chest pains after tube insertion. Neg CxR. Better now He did pass flatus this PM.  Tolerating clear liquid diet p.o.  Plan: -Start trickle tube feeds  -Ambulate -CT panc protocol 5/29   Magnus Schuller, MD [facility] [phone]

## 2024-04-27 NOTE — Progress Notes (Signed)
 Subjective/Chief Complaint: Reports mild left chest pain with breathing ever since he had cortrak placed under fluoro.  States he abdominal pain is stable to slightly improved. Denies vomiting. Reports a lot of flatus yesterday but no flatus so far today, No BM in several days.   Objective: Vital signs in last 24 hours: Temp:  [98 F (36.7 C)-98.7 F (37.1 C)] 98.7 F (37.1 C) (05/28 0417) Pulse Rate:  [82-90] 82 (05/28 0417) Resp:  [15-18] 15 (05/28 0417) BP: (129-137)/(83-90) 129/83 (05/28 0417) SpO2:  [96 %-97 %] 97 % (05/28 0755) Last BM Date : 04/25/24  Intake/Output from previous [facility]: 05/27 0701 - 05/28 0700 In: 830 [P.O.:480; IV Piggyback:350] Out: -  Intake/Output this shift: Total I/O In: 240 [P.O.:240] Out: -  Patient sitting up in NAD, appears comfortable Normal respiratory rate and effort ORA Cortrak in place, clamped Abd: soft, moderately distended, diffusely tender without guarding - improved compared to previous exam, prior surgical scars noted    Lab Results:  Recent Labs    04/26/24 0432 04/27/24 0500  WBC 20.7* 18.7*  HGB 10.0* 10.0*  HCT 31.3* 31.6*  PLT 725* 702*   BMET Recent Labs    04/26/24 0432 04/27/24 0500  NA 136 132*  K 3.7 4.0  CL 99 98  CO2 28 26  GLUCOSE 131* 119*  BUN 7 7  CREATININE 1.01 1.16  CALCIUM 9.0 9.0   PT/INR No results for input(s): "LABPROT", "INR" in the last 72 hours. ABG No results for input(s): "PHART", "HCO3" in the last 72 hours.  Invalid input(s): "PCO2", "PO2"  Studies/Results: DG CHEST PORT 1 VIEW Result Date: 04/27/2024 CLINICAL DATA:  Chest pain EXAM: PORTABLE CHEST 1 VIEW COMPARISON:  04/15/2024 FINDINGS: Persistent left base collapse/consolidation with small left pleural effusion. Right lung clear. Cardiopericardial silhouette is at upper limits of normal for size. A feeding tube passes into the stomach although the distal tip position is not included on the film. Telemetry leads overlie  the chest. IMPRESSION: Persistent left base collapse/consolidation with small left pleural effusion. Electronically Signed   By: Donnal Fusi M.D.   On: 04/27/2024 10:47     Anti-infectives: Anti-infectives (From admission, onward)    Start     Dose/Rate Route Frequency Ordered Stop   04/19/24 1000  piperacillin -tazobactam (ZOSYN ) IVPB 3.375 g        3.375 g 12.5 mL/hr over 240 Minutes Intravenous Every 8 hours 04/19/24 0854     04/12/24 1000  cefTRIAXone  (ROCEPHIN ) 2 g in sodium chloride  0.9 % 100 mL IVPB  Status:  Discontinued        2 g 200 mL/hr over 30 Minutes Intravenous Daily 04/12/24 0758 04/19/24 0849   04/10/24 2100  metroNIDAZOLE  (FLAGYL ) IVPB 500 mg  Status:  Discontinued        500 mg 100 mL/hr over 60 Minutes Intravenous Every 12 hours 04/10/24 0840 04/19/24 0849   04/10/24 2100  vancomycin  (VANCOCIN ) IVPB 1000 mg/200 mL premix  Status:  Discontinued        1,000 mg 200 mL/hr over 60 Minutes Intravenous Every 12 hours 04/10/24 0940 04/12/24 0758   04/10/24 1600  ceFEPIme  (MAXIPIME ) 2 g in sodium chloride  0.9 % 100 mL IVPB  Status:  Discontinued        2 g 200 mL/hr over 30 Minutes Intravenous Every 8 hours 04/10/24 0940 04/12/24 0758   04/10/24 0830  metroNIDAZOLE  (FLAGYL ) IVPB 500 mg        500 mg 100 mL/hr  over 60 Minutes Intravenous  Once 04/10/24 0819 04/10/24 1222   04/10/24 0800  vancomycin  (VANCOREADY) IVPB 1750 mg/350 mL        1,750 mg 175 mL/hr over 120 Minutes Intravenous  Once 04/10/24 0722 04/10/24 1120   04/10/24 0730  ceFEPIme  (MAXIPIME ) 2 g in sodium chloride  0.9 % 100 mL IVPB        2 g 200 mL/hr over 30 Minutes Intravenous  Once 04/10/24 1610 04/10/24 1430       Assessment/Plan: 58 y/o M with acute on chronic pancreatitis with peripancreatic fluid collections Pancreatic pseudoaneurysm  - IR evaluated and sees a pancreatic tail pseudoaneurysm, likely arising from splenic artery, and ordered a CTA to further evaluate. CTA shows two peripancreatic  fluid collections that are felt to be thrombosed due to pseudoaneurysm and drainage/aspiration is not recommended. There are some subcapsular splenic fluid collections that they do not recommend draining. There is an additional fluid collection near the descending colon that is too small for drainage. - he remains on IV Zosyn . I agree with GI and I think these can be stopped because colitis at the splenic flexure is most likely reactive and not infectious. Noted plans for follow up CT tomorrow or Friday and will follow results. - Per GI - question splenic infarct as other reason for inflammation - no acute surgical need but will follow    Possible partial large bowel obstruction Inflammatory stranding around the proximal colon this secondary to his pancreatitis.  Very mild upstream dilation and is tolerating p.o.  increase bowel regiment to miralax  BID and daily suppository PRN.    Left chest pain - per primary service, known COPD, recent COVID, left pleural effusion   FEN - FLD, IVF per primary; noted plans for cortrak.  VTE - SCD's ID - Zosyn  5/20>>; ok to stop abx and follow clinically     - per [facility] -   COVID + COPD GERD Spinal stenosis - on lyrica   Tobacco abuse    LOS: 16 days    Charlott Converse PA-C 04/27/2024  I reviewed Consultant GI notes, hospitalist notes, last 24 h vitals and pain scores, last 48 h intake and output, last 24 h labs and trends, and last 24 h imaging results.   This care required moderate level of medical decision making.

## 2024-04-28 ENCOUNTER — Inpatient Hospital Stay (HOSPITAL_COMMUNITY)

## 2024-04-28 DIAGNOSIS — K8689 Other specified diseases of pancreas: Secondary | ICD-10-CM | POA: Diagnosis not present

## 2024-04-28 DIAGNOSIS — E871 Hypo-osmolality and hyponatremia: Secondary | ICD-10-CM

## 2024-04-28 LAB — RENAL FUNCTION PANEL
Albumin: 2.9 g/dL — ABNORMAL LOW (ref 3.5–5.0)
Anion gap: 10 (ref 5–15)
BUN: 6 mg/dL (ref 6–20)
CO2: 22 mmol/L (ref 22–32)
Calcium: 8.7 mg/dL — ABNORMAL LOW (ref 8.9–10.3)
Chloride: 96 mmol/L — ABNORMAL LOW (ref 98–111)
Creatinine, Ser: 1.05 mg/dL (ref 0.61–1.24)
GFR, Estimated: >60 mL/min (ref >60)
Glucose, Bld: 126 mg/dL — ABNORMAL HIGH (ref 70–99)
Phosphorus: 3.6 mg/dL (ref 2.5–4.6)
Potassium: 3.6 mmol/L (ref 3.5–5.1)
Sodium: 128 mmol/L — ABNORMAL LOW (ref 135–145)

## 2024-04-28 LAB — GLUCOSE, CAPILLARY: Glucose-Capillary: 111 mg/dL — ABNORMAL HIGH (ref 70–99)

## 2024-04-28 LAB — TROPONIN I (HIGH SENSITIVITY): Troponin I (High Sensitivity): 6 ng/L (ref <18)

## 2024-04-28 MED ORDER — LIDOCAINE VISCOUS HCL 2 % MT SOLN
OROMUCOSAL | Status: AC
Start: 2024-04-28 — End: ?
  Filled 2024-04-28: qty 15

## 2024-04-28 MED ORDER — METHOCARBAMOL 1000 MG/10ML IJ SOLN
500.0000 mg | Freq: Three times a day (TID) | INTRAMUSCULAR | Status: DC
  Administered 2024-04-28 – 2024-05-02 (×13): 500 mg via INTRAVENOUS
  Filled 2024-04-28 (×14): qty 10

## 2024-04-28 MED ORDER — IOHEXOL 9 MG/ML PO SOLN
ORAL | Status: AC
  Filled 2024-04-28: qty 1000

## 2024-04-28 MED ORDER — IOHEXOL 300 MG/ML  SOLN
100.0000 mL | Freq: Once | INTRAMUSCULAR | Status: AC | PRN
  Administered 2024-04-28: 100 mL via INTRAVENOUS

## 2024-04-28 MED ORDER — PROSOURCE TF20 ENFIT COMPATIBL EN LIQD
60.0000 mL | Freq: Every day | ENTERAL | Status: DC
  Administered 2024-04-29 – 2024-05-01 (×2): 60 mL
  Filled 2024-04-28 (×4): qty 60

## 2024-04-28 MED ORDER — MAGIC MOUTHWASH W/LIDOCAINE
5.0000 mL | Freq: Three times a day (TID) | ORAL | Status: DC | PRN
  Administered 2024-04-29: 5 mL via ORAL
  Filled 2024-04-28 (×2): qty 5

## 2024-04-28 MED ORDER — OSMOLITE 1.5 CAL PO LIQD
1000.0000 mL | ORAL | Status: DC
  Administered 2024-04-28: 1000 mL
  Filled 2024-04-28 (×6): qty 1000

## 2024-04-28 NOTE — Plan of Care (Signed)

## 2024-04-28 NOTE — Progress Notes (Signed)
 Mobility Specialist - Progress Note   04/28/24 1321  Mobility  Activity Ambulated independently in hallway  Level of Assistance Independent  Assistive Device None  Distance Ambulated (ft) 1000 ft  Activity Response Tolerated well  Mobility Referral Yes  Mobility visit 1 Mobility  Mobility Specialist Start Time (ACUTE ONLY) 1255  Mobility Specialist Stop Time (ACUTE ONLY) 1320  Mobility Specialist Time Calculation (min) (ACUTE ONLY) 25 min   Pt received in bed and agreeable to mobility. C/o chest pain throughout session. RN aware. No other complaints during session. Pt to bed after session with all needs met.    [facility]
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
## 2024-04-28 NOTE — Progress Notes (Signed)
 Nutrition Follow-up  DOCUMENTATION CODES:   Non-severe (moderate) malnutrition in context of acute illness/injury  INTERVENTION:   Monitor magnesium , potassium, and phosphorus for at least 3 days, MD to replete as needed, as pt is at risk for refeeding syndrome.   Via small bore NGT: -Initiate Osmolite 1.5 starting @ 20 ml/hr, advancing by 10 ml every 24 hours to goal rate of 60 ml/hr. -60 ml Prosource TF 20 daily -Provides 2240 kcals, 110g protein and 1097 ml H2O  -Continue Boost Breeze po TID, each supplement provides 250 kcal and 9 grams of protein as tolerated -B complex w/ Vitamin C daily   NUTRITION DIAGNOSIS:   Moderate Malnutrition related to acute illness as evidenced by mild fat depletion, energy intake < 75% for > 7 days.  Ongoing.  GOAL:   Patient will meet greater than or equal to 90% of their needs  Not meeting currently.   MONITOR:   TF tolerance, PO intake, Supplement acceptance  REASON FOR ASSESSMENT:   Consult Assessment of nutrition requirement/status, Enteral/tube feeding initiation and management  ASSESSMENT:   58 year old male with history of suspected lung CA, GERD, COPD, chronic pancreatitis with pseudocyst presented to the hospital with cough, abdominal pain.  He was found to have COPD exacerbation in the setting of COVID infection along with acute on chronic pancreatitis complicated by pseudocyst.  5/11: admitted 5/27: Cortrak placed in IR  Patient went to IR this morning to have small bore NGT adjusted to work out a kink. Radiology documented tube tip located within proximal duodenum. Per GI, okay to start tube feeds today. Will start at 20 ml/hr and advance every 24 hours. Pt at refeeding risk as well. RN made aware.  Admission weight: 175 lbs Last weight 5/22: 166 lbs Daily weights order on TF protocol.  Medications: B complex w/ Vitamin C, Dulcolax, Vitamin B-12, Multivitamin with minerals daily, Miralax , Zofran   Labs reviewed: Low  Na   Diet Order:   Diet Order             Diet full liquid Fluid consistency: Thin  Diet effective now                   EDUCATION NEEDS:   Education needs have been addressed  Skin:  Skin Assessment: Reviewed RN Assessment  Last BM:  5/26  Height:   Ht Readings from Last 1 Encounters:  04/10/24 5\' 8"  (1.727 m)    Weight:   Wt Readings from Last 1 Encounters:  04/21/24 75.6 kg   BMI:  Body mass index is 25.35 kg/m.  Estimated Nutritional Needs:   Kcal:  2100-2300  Protein:  115-130g  Fluid:  2.1L/[place]   Arna Better, MS, RD, LDN Inpatient Clinical Dietitian Contact via Secure chat

## 2024-04-29 ENCOUNTER — Inpatient Hospital Stay (HOSPITAL_COMMUNITY)

## 2024-04-29 DIAGNOSIS — K8689 Other specified diseases of pancreas: Secondary | ICD-10-CM | POA: Diagnosis not present

## 2024-04-29 LAB — BASIC METABOLIC PANEL WITH GFR
Anion gap: 6 (ref 5–15)
BUN: 8 mg/dL (ref 6–20)
CO2: 25 mmol/L (ref 22–32)
Calcium: 9 mg/dL (ref 8.9–10.3)
Chloride: 100 mmol/L (ref 98–111)
Creatinine, Ser: 0.97 mg/dL (ref 0.61–1.24)
GFR, Estimated: >60 mL/min (ref >60)
Glucose, Bld: 133 mg/dL — ABNORMAL HIGH (ref 70–99)
Potassium: 3.8 mmol/L (ref 3.5–5.1)
Sodium: 131 mmol/L — ABNORMAL LOW (ref 135–145)

## 2024-04-29 LAB — CBC
HCT: 33.8 % — ABNORMAL LOW (ref 39.0–52.0)
HCT: 36.3 % — ABNORMAL LOW (ref 39.0–52.0)
Hemoglobin: 11 g/dL — ABNORMAL LOW (ref 13.0–17.0)
Hemoglobin: 11.3 g/dL — ABNORMAL LOW (ref 13.0–17.0)
MCH: 28.6 pg (ref 26.0–34.0)
MCH: 28.5 pg (ref 26.0–34.0)
MCHC: 32.5 g/dL (ref 30.0–36.0)
MCHC: 31.1 g/dL (ref 30.0–36.0)
MCV: 88 fL (ref 80.0–100.0)
MCV: 91.7 fL (ref 80.0–100.0)
Platelets: 713 10*3/uL — ABNORMAL HIGH (ref 150–400)
Platelets: 670 10*3/uL — ABNORMAL HIGH (ref 150–400)
RBC: 3.84 MIL/uL — ABNORMAL LOW (ref 4.22–5.81)
RBC: 3.96 MIL/uL — ABNORMAL LOW (ref 4.22–5.81)
RDW: 16.8 % — ABNORMAL HIGH (ref 11.5–15.5)
RDW: 16.8 % — ABNORMAL HIGH (ref 11.5–15.5)
WBC: 12.8 10*3/uL — ABNORMAL HIGH (ref 4.0–10.5)
WBC: 13.2 10*3/uL — ABNORMAL HIGH (ref 4.0–10.5)
nRBC: 0 % (ref 0.0–0.2)
nRBC: 0 % (ref 0.0–0.2)

## 2024-04-29 LAB — MAGNESIUM: Magnesium: 1.9 mg/dL (ref 1.7–2.4)

## 2024-04-29 LAB — GLUCOSE, CAPILLARY
Glucose-Capillary: 107 mg/dL — ABNORMAL HIGH (ref 70–99)
Glucose-Capillary: 117 mg/dL — ABNORMAL HIGH (ref 70–99)
Glucose-Capillary: 118 mg/dL — ABNORMAL HIGH (ref 70–99)
Glucose-Capillary: 124 mg/dL — ABNORMAL HIGH (ref 70–99)
Glucose-Capillary: 124 mg/dL — ABNORMAL HIGH (ref 70–99)
Glucose-Capillary: 154 mg/dL — ABNORMAL HIGH (ref 70–99)
Glucose-Capillary: 156 mg/dL — ABNORMAL HIGH (ref 70–99)

## 2024-04-29 LAB — CULTURE, BLOOD (ROUTINE X 2)
Culture: NO GROWTH
Culture: NO GROWTH

## 2024-04-29 LAB — PHOSPHORUS: Phosphorus: 3.7 mg/dL (ref 2.5–4.6)

## 2024-04-29 LAB — PROCALCITONIN: Procalcitonin: 0.19 ng/mL

## 2024-04-29 MED ORDER — DICLOFENAC SODIUM 1 % EX GEL
2.0000 g | Freq: Four times a day (QID) | CUTANEOUS | Status: DC
  Administered 2024-04-29 – 2024-05-02 (×5): 2 g via TOPICAL
  Filled 2024-04-29: qty 100

## 2024-04-29 MED ORDER — SUCRALFATE 1 GM/10ML PO SUSP
1.0000 g | Freq: Four times a day (QID) | ORAL | Status: DC
  Administered 2024-04-29 – 2024-05-02 (×13): 1 g via ORAL
  Filled 2024-04-29 (×13): qty 10

## 2024-04-29 MED ORDER — SODIUM CHLORIDE 0.9 % IV SOLN
3.0000 g | Freq: Four times a day (QID) | INTRAVENOUS | Status: DC
  Administered 2024-04-29 – 2024-05-01 (×6): 3 g via INTRAVENOUS
  Filled 2024-04-29 (×7): qty 8

## 2024-04-29 NOTE — Progress Notes (Signed)
     Patient Name: Mitchel Delduca Cataldo           DOB: 06-16-1966  MRN: [id number]      Admission Date: 04/10/2024  Attending Provider: Enrigue Harvard, DO  Primary Diagnosis: Peripancreatic fluid collection   Level of care: Telemetry   CROSS COVERAGE NOTE    Date of Service   04/29/2024   Broderic N Egler, 58 y.o. male, was admitted on 04/10/2024 for Peripancreatic fluid collection.    HPI/Events of Note   Concerns for aspiration related to tube feeds. Core track noted to be out of place, it was only a couple centimeters in his nose. RN staff reports patient is afebrile.  O2 sat ~ 85% on room air.  He has been placed on 2 L [place]. Patient has been been persistently coughing since earlier today.   No wheezing or increased shortness of breath.   Interventions/ Plan   Chest x-ray --> "Small left pleural effusion with patchy left basilar opacities, which may represent atelectasis or PNA." Unasyn  started - [facility] team to reassess continuing abx. Continue incentive spirometry Oxygen therapy as needed      Korie Streat, DNP, ACNPC- AG [facility] [place]

## 2024-04-29 NOTE — Plan of Care (Signed)
  Problem: Education: Goal: Knowledge of risk factors and measures for prevention of condition will improve Outcome: Progressing   Problem: Coping: Goal: Psychosocial and spiritual needs will be supported Outcome: Progressing   Problem: Respiratory: Goal: Will maintain a patent airway Outcome: Progressing  Problem: Clinical Measurements: Goal: Ability to maintain clinical measurements within normal limits will improve Outcome: Progressing Goal: Will remain free from infection Outcome: Progressing Goal: Diagnostic test results will improve Outcome: Progressing Goal: Respiratory complications will improve Outcome: Progressing Goal: Cardiovascular complication will be avoided Outcome: Progressing   Problem: Activity: Goal: Risk for activity intolerance will decrease Outcome: Progressing   Problem: Nutrition: Goal: Adequate nutrition will be maintained Outcome: Progressing   PProblem: Elimination: Goal: Will not experience complications related to bowel motility Outcome: Progressing Goal: Will not experience complications related to urinary retention Outcome: Progressing   Problem: Safety: Goal: Ability to remain free from injury will improve Outcome: Progressing   Problem: Skin Integrity: Goal: Risk for impaired skin integrity will decrease Outcome: Progressing
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
## 2024-04-29 NOTE — Progress Notes (Signed)
 [facility]                                                                              Wesley [name], is a 58 y.o. male, DOB - June 06, 1966, EXB:[id number] Admit date - 04/10/2024    Outpatient Primary MD for the patient is [name], DO  LOS - 18  days  Chief Complaint  Patient presents with   Shortness of Breath       Brief summary   Patient is a 58 year old male with history of suspected lung CA, GERD, COPD, chronic pancreatitis with pseudocyst presented to the hospital with cough, abdominal pain.  He was found to have COPD exacerbation in the setting of COVID infection along with acute on chronic pancreatitis complicated by pseudocyst. He was placed on IV fluids, pain control, bronchodilators, diet was advanced On 5/17, patient noted to have abdominal distention with likely ileus, no SBO or obstruction. Continues to require NG tube and GI/GS follow up - Tube feeds started on 5/30 - Continues to have discomfort in chest that appears to be esophageal    Assessment & Plan   Acute on chronic pancreatitis, intra-abdominal fluid collections, pseudocyst, pseudoaneurysm - CT abdomen on 5/19 showed perisplenic fluid collection, another fluid collection near the diaphragm, wall thickening and inflammation of the splenic flexure of the colon,  largest collection adjacent to the colon, 2.3x 4.4x 5.9 cm -stop abx - General Surgery/GI following - Tube feeds started via postpyloric core track on 5/30 - Repeat CT on 5/29 shows slight decrease in the size of the left upper quadrant fluid collection and possibly developing pseudocyst in the pancreatic tail. No current plan for surgery  Chest discomfort - Carafate  added along with Voltaren gel for reproducible pain with palpation - If continues will consider CTA  Possible partial large bowel obstruction -per GS - Continue bowel regimen  descending colitis -Likely reactive  -stop abx for now and monitor  COVID  infection - COVID was positive on 04/10/2024, currently no hypoxia or shortness of breath -Airborne precautions removed   COPD exacerbation, - Improved, no wheezing - Continue bronchodilators  RUL nodule, suspected lung Ca - Patient had a PET scan, last 2 biopsies were nondiagnostic, will need to follow-up outpatient.  Has seen radiation oncology recently and was recommended repeating another CT scan in 6 months  GERD/gastritis - Continue PPI   intermittent blurry vision - MRI brain is negative, recommended outpatient follow-up with ophthalmology  Hyponatremia - Improving  Hypokalemia Replace as needed   Interventions: Tube feeding, Prostat  Estimated body mass index is 24.4 kg/m as calculated from the following:   Height as of this encounter: 5\' 8"  (1.727 m).   Weight as of this encounter: 72.8 kg.  Code Status: Full code DVT Prophylaxis:  Place and maintain sequential compression device Start: 04/21/24 0558   Level of Care: Level of care: Telemetry Family Communication: Updated patient Disposition Plan:      Remains inpatient appropriate:       Consultants:   General Surgery GI Intervention radiology    Medications  amitriptyline   50 mg Oral QHS   B-complex with vitamin C  1 tablet Oral Daily   cyanocobalamin   1,000 mcg Oral Daily   diclofenac  Sodium  2 g Topical QID   feeding supplement  1 Container Oral TID BM   feeding supplement (PROSource TF20)  60 mL Per Tube Daily   fluticasone  furoate-vilanterol  1 puff Inhalation Daily   methocarbamol  (ROBAXIN ) injection  500 mg Intravenous Q8H   multivitamin with minerals  1 tablet Oral Daily   pantoprazole   40 mg Oral BID   polyethylene glycol  17 g Oral BID   pregabalin   150 mg Oral TID   sucralfate   1 g Oral Q6H   umeclidinium bromide   1 puff Inhalation Daily      Subjective:   Currently tolerating tube feeds at 20 cc/h, still has chest wall pain   Objective:   Vitals:   04/28/24 1959 04/29/24  0416 04/29/24 0500 04/29/24 0756  BP: 127/83 104/75    Pulse: 92 88    Resp: 19 18    Temp: 97.7 F (36.5 C) 98.4 F (36.9 C)    TempSrc: Oral Oral    SpO2: 100% 91%  (S) (!) 87%  Weight:   72.8 kg   Height:        Intake/Output Summary (Last 24 hours) at 04/29/2024 1341 Last data filed at 04/29/2024 0845 Gross per 24 hour  Intake 1344.33 ml  Output --  Net 1344.33 ml     Wt Readings from Last 3 Encounters:  04/29/24 72.8 kg  03/10/24 79.9 kg  12/29/23 75.8 kg   Physical Exam  General: Appearance:     Overweight male in no acute distress-NG tube in place     Lungs:      respirations unlabored  Heart:    Normal heart rate.   MS:   All extremities are intact.   Neurologic:   Awake, alert     Data Reviewed:  I have personally reviewed following labs    CBC Lab Results  Component Value Date   WBC 12.8 (H) 04/29/2024   RBC 3.84 (L) 04/29/2024   HGB 11.0 (L) 04/29/2024   HCT 33.8 (L) 04/29/2024   MCV 88.0 04/29/2024   MCH 28.6 04/29/2024   PLT 713 (H) 04/29/2024   MCHC 32.5 04/29/2024   RDW 16.8 (H) 04/29/2024   LYMPHSABS 2.2 04/18/2024   MONOABS 1.6 (H) 04/18/2024   EOSABS 0.0 04/18/2024   BASOSABS 0.0 04/18/2024     Last metabolic panel Lab Results  Component Value Date   NA 131 (L) 04/29/2024   K 3.8 04/29/2024   CL 100 04/29/2024   CO2 25 04/29/2024   BUN 8 04/29/2024   CREATININE 0.97 04/29/2024   GLUCOSE 133 (H) 04/29/2024   GFRNONAA >60 04/29/2024   GFRAA 106 10/08/2020   CALCIUM 9.0 04/29/2024   PHOS 3.7 04/29/2024   PROT 6.8 04/25/2024   ALBUMIN  2.9 (L) 04/28/2024   LABGLOB 3.9 09/17/2022   AGRATIO 0.9 (L) 09/17/2022   BILITOT 1.7 (H) 04/25/2024   ALKPHOS 204 (H) 04/25/2024   AST 71 (H) 04/25/2024   ALT 49 (H) 04/25/2024   ANIONGAP 6 04/29/2024    CBG (last 3)  Recent Labs    04/29/24 0414 04/29/24 0804 04/29/24 1154  GLUCAP 154* 118* 117*      Coagulation Profile: No results for input(s): "INR", "PROTIME" in the last  168 hours.   Radiology Studies: I have personally reviewed the imaging studies  CT ABDOMEN PELVIS W WO CONTRAST Result Date: 04/28/2024 CLINICAL DATA:  Pancreatitis. EXAM: CT ABDOMEN AND PELVIS WITHOUT AND WITH CONTRAST TECHNIQUE: Multidetector CT imaging of the abdomen and pelvis was performed following the standard protocol before and following the bolus administration of intravenous contrast. RADIATION DOSE REDUCTION: This exam was performed according to the departmental dose-optimization program which includes automated exposure control, adjustment of the mA and/or kV according to patient size and/or use of iterative reconstruction technique. CONTRAST:  OMNIPAQUE  IOHEXOL  300 MG/ML  SOLN COMPARISON:  04/23/2024 FINDINGS: Lower chest: Small left pleural effusion with left lower lobe airspace opacity with air bronchograms, stable. Linear areas of atelectasis in the right lung base. Hepatobiliary: Scattered hypodensities in the liver compatible with cysts. Prior cholecystectomy. Pancreas: Again noted are punctate calcifications in the pancreatic head and uncinate process compatible with chronic pancreatitis. Again noted is cystic area within the pancreatic tail measuring 2.2 x 1.6 cm compared to 2.7 x 1.7 cm previously. Fluid collections extending from the pancreatic tail and surrounding the splenic flexure and descending colon are again noted slightly decreased in size. Index fluid collection on image 60 has is a maximum diameter of 2.6 cm compared to 3.6 cm previously. No evidence of pancreatic necrosis. Mild pancreatic ductal dilatation is stable. Spleen: Multiple perisplenic fluid collections are noted, similar to prior study. Lenticular fluid collection along the superior aspect of the spleen has a thickness of 2.2 cm, stable. Adrenals/Urinary Tract: No adrenal abnormality. No focal renal abnormality. No stones or hydronephrosis. Urinary bladder is unremarkable. Stomach/Bowel: Feeding tube loops in  the stomach with the tip in the proximal duodenum. Again noted is moderate distention of the right colon and transverse colon with abrupt caliber change at the splenic flexure. Continued wall thickening noted at the splenic flexure and in the proximal descending colon, similar to prior study. This may reflect colitis or secondary inflammation related to adjacent pancreatitis/fluid collections. Small bowel and stomach unremarkable. Vascular/Lymphatic: Aortoiliac atherosclerosis. No evidence of aneurysm or adenopathy. Reproductive: No visible focal abnormality. Other: No free fluid or free air. Musculoskeletal: No acute bony abnormality. IMPRESSION: Left upper quadrant fluid collections adjacent to the splenic flexure in descending colon slightly decreased in size since prior study. Perisplenic fluid collections essentially stable. Irregular cystic area in the pancreatic tail may reflect developing pseudo cyst. This is slightly smaller in size since prior study. Stable small left pleural effusion with consolidative process in the left lower lobe, unchanged. Continued wall thickening in the splenic flexure and proximal descending colon in the area of adjacent fluid collections. This could reflect colitis or secondary inflammation. Continued moderate gaseous distention of the right colon and transverse colon. Electronically Signed   By: Janeece Mechanic M.D.   On: 04/28/2024 23:22   DG Andres Kea Tube Plc W/Fl W/Rad Result Date: 04/28/2024 CLINICAL DATA:  Nasogastric tube present. Additional history provided: Request received for nasogastric tube repositioning under fluoroscopy due to kinking of the tube within the stomach. EXAM: NASO G TUBE PLACEMENT WITH FL AND WITH RAD CONTRAST:  None. FLUOROSCOPY: Fluoroscopy Time:  54 seconds Radiation Exposure Index (if provided by the fluoroscopic device): 13.70 Number of Acquired Spot Images: 4 COMPARISON:  Fluoroscopic images from Dobbhoff tube placement 04/26/2024. FINDINGS: A  preliminary radiograph was taken of the Dobbhoff tube at the level of the stomach. A kink was identified within the tube at the level of the gastric antrum. Subsequently under intermittent fluoroscopy, a guidewire was advanced into the tube and the kink resolved. The tube tip remained in the very proximal duodenum. IMPRESSION: Dobbhoff tube repositioning  under fluoroscopy, relieving the kink at the level of the gastric antrum, as described. Electronically Signed   By: Bascom Lily D.O.   On: 04/28/2024 10:46         Enrigue Harvard DO [facility] 04/29/2024, 1:41 PM  Available via Epic secure chat 7am-7pm After 7 pm, please refer to night coverage provider listed on amion.

## 2024-04-29 NOTE — Progress Notes (Signed)
 Subjective/Chief Complaint: Reports mild left chest pain with breathing ever since he had cortrak placed under fluoro.  States he abdominal pain is stable to slightly improved. Denies vomiting. Reports a lot of flatus yesterday but no flatus so far today, No BM in several days.   Objective: Vital signs in last 24 hours: Temp:  [97.7 F (36.5 C)-98.4 F (36.9 C)] 98.4 F (36.9 C) (05/30 0416) Pulse Rate:  [88-95] 88 (05/30 0416) Resp:  [18-19] 18 (05/30 0416) BP: (104-127)/(75-87) 104/75 (05/30 0416) SpO2:  [87 %-100 %] 87 % (05/30 0756) Weight:  [72.8 kg] 72.8 kg (05/30 0500) Last BM Date : 04/25/24  Intake/Output from previous Malcolm: 05/29 0701 - 05/30 0700 In: 1064.3 [P.O.:736; NG/GT:328.3] Out: -  Intake/Output this shift: Total I/O In: 300 [P.O.:240; NG/GT:60] Out: -  Patient sitting up in NAD, appears comfortable Normal respiratory rate and effort ORA Cortrak in place, clamped Abd: soft, moderately distended, diffusely tender without guarding - improved compared to previous exam, prior surgical scars noted    Lab Results:  Recent Labs    04/27/24 0500 04/29/24 0439  WBC 18.7* 12.8*  HGB 10.0* 11.0*  HCT 31.6* 33.8*  PLT 702* 713*   BMET Recent Labs    04/28/24 0458 04/29/24 0439  NA 128* 131*  K 3.6 3.8  CL 96* 100  CO2 22 25  GLUCOSE 126* 133*  BUN 6 8  CREATININE 1.05 0.97  CALCIUM 8.7* 9.0   PT/INR No results for input(s): "LABPROT", "INR" in the last 72 hours. ABG No results for input(s): "PHART", "HCO3" in the last 72 hours.  Invalid input(s): "PCO2", "PO2"  Studies/Results: CT ABDOMEN PELVIS W WO CONTRAST Result Date: 04/28/2024 CLINICAL DATA:  Pancreatitis. EXAM: CT ABDOMEN AND PELVIS WITHOUT AND WITH CONTRAST TECHNIQUE: Multidetector CT imaging of the abdomen and pelvis was performed following the standard protocol before and following the bolus administration of intravenous contrast. RADIATION DOSE REDUCTION: This exam was performed  according to the departmental dose-optimization program which includes automated exposure control, adjustment of the mA and/or kV according to patient size and/or use of iterative reconstruction technique. CONTRAST:  OMNIPAQUE  IOHEXOL  300 MG/ML  SOLN COMPARISON:  04/23/2024 FINDINGS: Lower chest: Small left pleural effusion with left lower lobe airspace opacity with air bronchograms, stable. Linear areas of atelectasis in the right lung base. Hepatobiliary: Scattered hypodensities in the liver compatible with cysts. Prior cholecystectomy. Pancreas: Again noted are punctate calcifications in the pancreatic head and uncinate process compatible with chronic pancreatitis. Again noted is cystic area within the pancreatic tail measuring 2.2 x 1.6 cm compared to 2.7 x 1.7 cm previously. Fluid collections extending from the pancreatic tail and surrounding the splenic flexure and descending colon are again noted slightly decreased in size. Index fluid collection on image 60 has is a maximum diameter of 2.6 cm compared to 3.6 cm previously. No evidence of pancreatic necrosis. Mild pancreatic ductal dilatation is stable. Spleen: Multiple perisplenic fluid collections are noted, similar to prior study. Lenticular fluid collection along the superior aspect of the spleen has a thickness of 2.2 cm, stable. Adrenals/Urinary Tract: No adrenal abnormality. No focal renal abnormality. No stones or hydronephrosis. Urinary bladder is unremarkable. Stomach/Bowel: Feeding tube loops in the stomach with the tip in the proximal duodenum. Again noted is moderate distention of the right colon and transverse colon with abrupt caliber change at the splenic flexure. Continued wall thickening noted at the splenic flexure and in the proximal descending colon, similar to prior study.  This may reflect colitis or secondary inflammation related to adjacent pancreatitis/fluid collections. Small bowel and stomach unremarkable. Vascular/Lymphatic:  Aortoiliac atherosclerosis. No evidence of aneurysm or adenopathy. Reproductive: No visible focal abnormality. Other: No free fluid or free air. Musculoskeletal: No acute bony abnormality. IMPRESSION: Left upper quadrant fluid collections adjacent to the splenic flexure in descending colon slightly decreased in size since prior study. Perisplenic fluid collections essentially stable. Irregular cystic area in the pancreatic tail may reflect developing pseudo cyst. This is slightly smaller in size since prior study. Stable small left pleural effusion with consolidative process in the left lower lobe, unchanged. Continued wall thickening in the splenic flexure and proximal descending colon in the area of adjacent fluid collections. This could reflect colitis or secondary inflammation. Continued moderate gaseous distention of the right colon and transverse colon. Electronically Signed   By: Janeece Mechanic M.D.   On: 04/28/2024 23:22   DG Andres Kea Tube Plc W/Fl W/Rad Result Date: 04/28/2024 CLINICAL DATA:  Nasogastric tube present. Additional history provided: Request received for nasogastric tube repositioning under fluoroscopy due to kinking of the tube within the stomach. EXAM: NASO G TUBE PLACEMENT WITH FL AND WITH RAD CONTRAST:  None. FLUOROSCOPY: Fluoroscopy Time:  54 seconds Radiation Exposure Index (if provided by the fluoroscopic device): 13.70 Number of Acquired Spot Images: 4 COMPARISON:  Fluoroscopic images from Dobbhoff tube placement 04/26/2024. FINDINGS: A preliminary radiograph was taken of the Dobbhoff tube at the level of the stomach. A kink was identified within the tube at the level of the gastric antrum. Subsequently under intermittent fluoroscopy, a guidewire was advanced into the tube and the kink resolved. The tube tip remained in the very proximal duodenum. IMPRESSION: Dobbhoff tube repositioning under fluoroscopy, relieving the kink at the level of the gastric antrum, as described. Electronically  Signed   By: Bascom Lily D.O.   On: 04/28/2024 10:46     Anti-infectives: Anti-infectives (From admission, onward)    Start     Dose/Rate Route Frequency Ordered Stop   04/19/24 1000  piperacillin -tazobactam (ZOSYN ) IVPB 3.375 g  Status:  Discontinued        3.375 g 12.5 mL/hr over 240 Minutes Intravenous Every 8 hours 04/19/24 0854 04/27/24 1201   04/12/24 1000  cefTRIAXone  (ROCEPHIN ) 2 g in sodium chloride  0.9 % 100 mL IVPB  Status:  Discontinued        2 g 200 mL/hr over 30 Minutes Intravenous Daily 04/12/24 0758 04/19/24 0849   04/10/24 2100  metroNIDAZOLE  (FLAGYL ) IVPB 500 mg  Status:  Discontinued        500 mg 100 mL/hr over 60 Minutes Intravenous Every 12 hours 04/10/24 0840 04/19/24 0849   04/10/24 2100  vancomycin  (VANCOCIN ) IVPB 1000 mg/200 mL premix  Status:  Discontinued        1,000 mg 200 mL/hr over 60 Minutes Intravenous Every 12 hours 04/10/24 0940 04/12/24 0758   04/10/24 1600  ceFEPIme  (MAXIPIME ) 2 g in sodium chloride  0.9 % 100 mL IVPB  Status:  Discontinued        2 g 200 mL/hr over 30 Minutes Intravenous Every 8 hours 04/10/24 0940 04/12/24 0758   04/10/24 0830  metroNIDAZOLE  (FLAGYL ) IVPB 500 mg        500 mg 100 mL/hr over 60 Minutes Intravenous  Once 04/10/24 0819 04/10/24 1222   04/10/24 0800  vancomycin  (VANCOREADY) IVPB 1750 mg/350 mL        1,750 mg 175 mL/hr over 120 Minutes Intravenous  Once 04/10/24  4401 04/10/24 1120   04/10/24 0730  ceFEPIme  (MAXIPIME ) 2 g in sodium chloride  0.9 % 100 mL IVPB        2 g 200 mL/hr over 30 Minutes Intravenous  Once 04/10/24 0272 04/10/24 1430       Assessment/Plan: 58 y/o M with acute on chronic pancreatitis with peripancreatic fluid collections Pancreatic pseudoaneurysm  - IR evaluated and sees a pancreatic tail pseudoaneurysm, likely arising from splenic artery, and ordered a CTA to further evaluate. CTA shows two peripancreatic fluid collections that are felt to be thrombosed due to pseudoaneurysm and  drainage/aspiration is not recommended. There are some subcapsular splenic fluid collections that they do not recommend draining. There is an additional fluid collection near the descending colon that is too small for drainage. - repeat CT yesterday 5/29 shows decrease in size of all fluid collections - IV abx stopped a couple days ago - Per GI - question splenic infarct as other reason for inflammation - no acute surgical need but will follow    Possible partial large bowel obstruction Inflammatory stranding around the proximal colon this secondary to his pancreatitis.  Very mild upstream dilation and is tolerating p.o.  increase bowel regiment to miralax  BID and daily suppository PRN. - repeat CT yesterday 5/29 shows stable splenic flexure changes with proximal large bowel dilation.  - continue miralax  BID, daily suppositories - no emergent surgical needs    Left chest pain - per primary service, known COPD, recent COVID, left pleural effusion   FEN - FLD, IVF per primary; cortrak advancing TF to goal VTE - SCD's ID - Zosyn  5/20>>; ok to stop abx and follow clinically     - per [facility] -   COVID + COPD GERD Spinal stenosis - on lyrica   Tobacco abuse    LOS: 18 days    Charlott Converse PA-C 04/29/2024  I reviewed Consultant GI notes, hospitalist notes, last 24 h vitals and pain scores, last 48 h intake and output, last 24 h labs and trends, and last 24 h imaging results.   This care required moderate level of medical decision making.

## 2024-04-30 ENCOUNTER — Inpatient Hospital Stay (HOSPITAL_COMMUNITY)

## 2024-04-30 DIAGNOSIS — K8689 Other specified diseases of pancreas: Secondary | ICD-10-CM | POA: Diagnosis not present

## 2024-04-30 LAB — CBC
HCT: 33.8 % — ABNORMAL LOW (ref 39.0–52.0)
Hemoglobin: 11.2 g/dL — ABNORMAL LOW (ref 13.0–17.0)
MCH: 29.2 pg (ref 26.0–34.0)
MCHC: 33.1 g/dL (ref 30.0–36.0)
MCV: 88.3 fL (ref 80.0–100.0)
Platelets: 625 10*3/uL — ABNORMAL HIGH (ref 150–400)
RBC: 3.83 MIL/uL — ABNORMAL LOW (ref 4.22–5.81)
RDW: 16.8 % — ABNORMAL HIGH (ref 11.5–15.5)
WBC: 13.5 10*3/uL — ABNORMAL HIGH (ref 4.0–10.5)
nRBC: 0 % (ref 0.0–0.2)

## 2024-04-30 LAB — MAGNESIUM: Magnesium: 2 mg/dL (ref 1.7–2.4)

## 2024-04-30 LAB — PHOSPHORUS: Phosphorus: 3.2 mg/dL (ref 2.5–4.6)

## 2024-04-30 LAB — BASIC METABOLIC PANEL WITH GFR
Anion gap: 8 (ref 5–15)
BUN: 11 mg/dL (ref 6–20)
CO2: 26 mmol/L (ref 22–32)
Calcium: 8.7 mg/dL — ABNORMAL LOW (ref 8.9–10.3)
Chloride: 97 mmol/L — ABNORMAL LOW (ref 98–111)
Creatinine, Ser: 1.1 mg/dL (ref 0.61–1.24)
GFR, Estimated: >60 mL/min (ref >60)
Glucose, Bld: 115 mg/dL — ABNORMAL HIGH (ref 70–99)
Potassium: 3.9 mmol/L (ref 3.5–5.1)
Sodium: 131 mmol/L — ABNORMAL LOW (ref 135–145)

## 2024-04-30 LAB — GLUCOSE, CAPILLARY
Glucose-Capillary: 130 mg/dL — ABNORMAL HIGH (ref 70–99)
Glucose-Capillary: 124 mg/dL — ABNORMAL HIGH (ref 70–99)
Glucose-Capillary: 143 mg/dL — ABNORMAL HIGH (ref 70–99)
Glucose-Capillary: 123 mg/dL — ABNORMAL HIGH (ref 70–99)
Glucose-Capillary: 139 mg/dL — ABNORMAL HIGH (ref 70–99)

## 2024-04-30 MED ORDER — IOHEXOL 350 MG/ML SOLN
75.0000 mL | Freq: Once | INTRAVENOUS | Status: AC | PRN
Start: 2024-04-30 — End: 2024-04-30
  Administered 2024-04-30: 75 mL via INTRAVENOUS

## 2024-04-30 MED ORDER — SODIUM CHLORIDE 0.9 % IV SOLN: INTRAVENOUS | Status: DC

## 2024-04-30 NOTE — Progress Notes (Signed)
 After shift change, patient's tube feeding leaking from nose. Per Cong shift, this happening around shift change, and patient have strong cough. RN assess the patient, noticed the feeding tube out of place, call the ICU charge nurse for further assist. Tube feeding stopped at the time 19:15 PM.

## 2024-04-30 NOTE — Progress Notes (Signed)
 Mobility Specialist - Progress Note   04/30/24 1531  Mobility  Activity Ambulated with assistance in hallway  Level of Assistance Independent after set-up  Assistive Device Other (Comment) (IV Pole)  Distance Ambulated (ft) 1000 ft  Range of Motion/Exercises Active  Activity Response Tolerated well  Mobility Referral Yes  Mobility visit 1 Mobility  Mobility Specialist Start Time (ACUTE ONLY) 1515  Mobility Specialist Stop Time (ACUTE ONLY) 1530  Mobility Specialist Time Calculation (min) (ACUTE ONLY) 15 min   Pt was found in bed and agreeable to ambulate. No complaints with session and returned to bed with all needs met. Call bell in reach.  Lorna Rose,  Mobility Specialist Can be reached via Secure Chat

## 2024-04-30 NOTE — Progress Notes (Signed)
 [facility]                                                                              Wesley [name], is a 58 y.o. male, DOB - 19-May-1966, ZOX:[id number] Admit date - 04/10/2024    Outpatient Primary MD for the patient is [name], DO  LOS - 19  days  Chief Complaint  Patient presents with   Shortness of Breath       Brief summary   Patient is a 58 year old male with history of suspected lung CA, GERD, COPD, chronic pancreatitis with pseudocyst presented to the hospital with cough, abdominal pain.  He was found to have COPD exacerbation in the setting of COVID infection along with acute on chronic pancreatitis complicated by pseudocyst. He was placed on IV fluids, pain control, bronchodilators, diet was advanced On 5/17, patient noted to have abdominal distention with likely ileus, no SBO or obstruction. Continues to require NG tube and GI/GS follow up - Tube feeds started on 5/30 - Continues to have discomfort in chest that appears to be esophageal - 5/31-NG tube came out, continues to have some chest discomfort so we will get CTA    Assessment & Plan   Acute on chronic pancreatitis, intra-abdominal fluid collections, pseudocyst, pseudoaneurysm - CT abdomen on 5/19 showed perisplenic fluid collection, another fluid collection near the diaphragm, wall thickening and inflammation of the splenic flexure of the colon,  largest collection adjacent to the colon, 2.3x 4.4x 5.9 cm -stop abx - General Surgery/GI following - Tube feeds started via postpyloric core track on 5/30-NG tube removed on p.m. of 530-  ?  If we need to consider TPN as did not tolerate NG tube well - Repeat abdominal CT on 5/29 shows slight decrease in the size of the left upper quadrant fluid collection and possibly developing pseudocyst in the pancreatic tail. No current plan for surgery  Chest discomfort - Carafate  added along with Voltaren  gel for reproducible pain with palpation -  Minimally improved with NG tube out and Carafate , so will get CTA after hydration - Questional aspirations antibiotics started overnight  Possible partial large bowel obstruction -per GS - Continue bowel regimen  descending colitis -Likely reactive  -stop abx for now and monitor  COVID infection - COVID was positive on 04/10/2024, currently no hypoxia or shortness of breath -Airborne precautions removed   COPD exacerbation, - Improved, no wheezing - Continue bronchodilators  RUL nodule, suspected lung Ca - Patient had a PET scan, last 2 biopsies were nondiagnostic, will need to follow-up outpatient.  Has seen radiation oncology recently and was recommended repeating another CT scan in 6 months  GERD/gastritis - Continue PPI   intermittent blurry vision - MRI brain is negative, recommended outpatient follow-up with ophthalmology  Hyponatremia - Improving  Hypokalemia Replace as needed   Interventions: Tube feeding, Prostat  Estimated body mass index is 23.67 kg/m as calculated from the following:   Height as of this encounter: 5\' 8"  (1.727 m).   Weight as of this encounter: 70.6 kg.  Code Status: Full code DVT Prophylaxis:  Place and maintain sequential compression device Start: 04/21/24 8119  Level of Care: Level of care: Telemetry Family Communication: Updated patient Disposition Plan:      Remains inpatient appropriate:       Consultants:   General Surgery GI Intervention radiology    Medications  amitriptyline   50 mg Oral QHS   B-complex with vitamin C  1 tablet Oral Daily   cyanocobalamin   1,000 mcg Oral Daily   diclofenac  Sodium  2 g Topical QID   feeding supplement  1 Container Oral TID BM   feeding supplement (PROSource TF20)  60 mL Per Tube Daily   fluticasone  furoate-vilanterol  1 puff Inhalation Daily   methocarbamol  (ROBAXIN ) injection  500 mg Intravenous Q8H   multivitamin with minerals  1 tablet Oral Daily   pantoprazole   40 mg Oral  BID   polyethylene glycol  17 g Oral BID   pregabalin   150 mg Oral TID   sucralfate   1 g Oral Q6H   umeclidinium bromide   1 puff Inhalation Daily      Subjective:   NG tube came out last night, patient still with chest discomfort  Objective:   Vitals:   04/30/24 0354 04/30/24 0357 04/30/24 0843 04/30/24 0845  BP: 106/78     Pulse: 83     Resp: 16     Temp: (!) 97.5 F (36.4 C)     TempSrc: Oral     SpO2: 98%  97% 97%  Weight:  70.6 kg    Height:        Intake/Output Summary (Last 24 hours) at 04/30/2024 1022 Last data filed at 04/30/2024 0354 Gross per 24 hour  Intake 491.22 ml  Output --  Net 491.22 ml     Wt Readings from Last 3 Encounters:  04/30/24 70.6 kg  03/10/24 79.9 kg  12/29/23 75.8 kg   Physical Exam  General: Appearance:     Overweight male in no acute distress-NG tube no longer in place   Left chest wall tenderness  Lungs:      respirations unlabored  Heart:    Normal heart rate.   MS:   All extremities are intact.   Neurologic:   Awake, alert     Data Reviewed:  I have personally reviewed following labs    CBC Lab Results  Component Value Date   WBC 13.5 (H) 04/30/2024   RBC 3.83 (L) 04/30/2024   HGB 11.2 (L) 04/30/2024   HCT 33.8 (L) 04/30/2024   MCV 88.3 04/30/2024   MCH 29.2 04/30/2024   PLT 625 (H) 04/30/2024   MCHC 33.1 04/30/2024   RDW 16.8 (H) 04/30/2024   LYMPHSABS 2.2 04/18/2024   MONOABS 1.6 (H) 04/18/2024   EOSABS 0.0 04/18/2024   BASOSABS 0.0 04/18/2024     Last metabolic panel Lab Results  Component Value Date   NA 131 (L) 04/30/2024   K 3.9 04/30/2024   CL 97 (L) 04/30/2024   CO2 26 04/30/2024   BUN 11 04/30/2024   CREATININE 1.10 04/30/2024   GLUCOSE 115 (H) 04/30/2024   GFRNONAA >60 04/30/2024   GFRAA 106 10/08/2020   CALCIUM 8.7 (L) 04/30/2024   PHOS 3.2 04/30/2024   PROT 6.8 04/25/2024   ALBUMIN  2.9 (L) 04/28/2024   LABGLOB 3.9 09/17/2022   AGRATIO 0.9 (L) 09/17/2022   BILITOT 1.7 (H)  04/25/2024   ALKPHOS 204 (H) 04/25/2024   AST 71 (H) 04/25/2024   ALT 49 (H) 04/25/2024   ANIONGAP 8 04/30/2024    CBG (last 3)  Recent Labs  04/29/24 2351 04/30/24 0351 04/30/24 0758  GLUCAP 107* 130* 124*      Coagulation Profile: No results for input(s): "INR", "PROTIME" in the last 168 hours.   Radiology Studies: I have personally reviewed the imaging studies  DG CHEST PORT 1 VIEW Result Date: 04/29/2024 CLINICAL DATA:  Aspiration into airway EXAM: PORTABLE CHEST 1 VIEW COMPARISON:  Chest x-ray 04/27/2024 FINDINGS: There is a small left pleural effusion with patchy left basilar opacities. Right lung is clear. Cardiomediastinal silhouette is within normal limits. No pneumothorax. No acute fractures. Cervical spinal fusion plate is present. IMPRESSION: Small left pleural effusion with patchy left basilar opacities, which may represent atelectasis or pneumonia. Electronically Signed   By: Tyron Gallon M.D.   On: 04/29/2024 22:31   CT ABDOMEN PELVIS W WO CONTRAST Result Date: 04/28/2024 CLINICAL DATA:  Pancreatitis. EXAM: CT ABDOMEN AND PELVIS WITHOUT AND WITH CONTRAST TECHNIQUE: Multidetector CT imaging of the abdomen and pelvis was performed following the standard protocol before and following the bolus administration of intravenous contrast. RADIATION DOSE REDUCTION: This exam was performed according to the departmental dose-optimization program which includes automated exposure control, adjustment of the mA and/or kV according to patient size and/or use of iterative reconstruction technique. CONTRAST:  OMNIPAQUE  IOHEXOL  300 MG/ML  SOLN COMPARISON:  04/23/2024 FINDINGS: Lower chest: Small left pleural effusion with left lower lobe airspace opacity with air bronchograms, stable. Linear areas of atelectasis in the right lung base. Hepatobiliary: Scattered hypodensities in the liver compatible with cysts. Prior cholecystectomy. Pancreas: Again noted are punctate calcifications in  the pancreatic head and uncinate process compatible with chronic pancreatitis. Again noted is cystic area within the pancreatic tail measuring 2.2 x 1.6 cm compared to 2.7 x 1.7 cm previously. Fluid collections extending from the pancreatic tail and surrounding the splenic flexure and descending colon are again noted slightly decreased in size. Index fluid collection on image 60 has is a maximum diameter of 2.6 cm compared to 3.6 cm previously. No evidence of pancreatic necrosis. Mild pancreatic ductal dilatation is stable. Spleen: Multiple perisplenic fluid collections are noted, similar to prior study. Lenticular fluid collection along the superior aspect of the spleen has a thickness of 2.2 cm, stable. Adrenals/Urinary Tract: No adrenal abnormality. No focal renal abnormality. No stones or hydronephrosis. Urinary bladder is unremarkable. Stomach/Bowel: Feeding tube loops in the stomach with the tip in the proximal duodenum. Again noted is moderate distention of the right colon and transverse colon with abrupt caliber change at the splenic flexure. Continued wall thickening noted at the splenic flexure and in the proximal descending colon, similar to prior study. This may reflect colitis or secondary inflammation related to adjacent pancreatitis/fluid collections. Small bowel and stomach unremarkable. Vascular/Lymphatic: Aortoiliac atherosclerosis. No evidence of aneurysm or adenopathy. Reproductive: No visible focal abnormality. Other: No free fluid or free air. Musculoskeletal: No acute bony abnormality. IMPRESSION: Left upper quadrant fluid collections adjacent to the splenic flexure in descending colon slightly decreased in size since prior study. Perisplenic fluid collections essentially stable. Irregular cystic area in the pancreatic tail may reflect developing pseudo cyst. This is slightly smaller in size since prior study. Stable small left pleural effusion with consolidative process in the left lower  lobe, unchanged. Continued wall thickening in the splenic flexure and proximal descending colon in the area of adjacent fluid collections. This could reflect colitis or secondary inflammation. Continued moderate gaseous distention of the right colon and transverse colon. Electronically Signed   By: Janeece Mechanic M.D.  On: 04/28/2024 23:22         Enrigue Harvard DO [facility] 04/30/2024, 10:22 AM  Available via Epic secure chat 7am-7pm After 7 pm, please refer to night coverage provider listed on amion.

## 2024-05-01 DIAGNOSIS — K8689 Other specified diseases of pancreas: Secondary | ICD-10-CM | POA: Diagnosis not present

## 2024-05-01 LAB — PHOSPHORUS: Phosphorus: 2.9 mg/dL (ref 2.5–4.6)

## 2024-05-01 LAB — GLUCOSE, CAPILLARY
Glucose-Capillary: 105 mg/dL — ABNORMAL HIGH (ref 70–99)
Glucose-Capillary: 108 mg/dL — ABNORMAL HIGH (ref 70–99)
Glucose-Capillary: 114 mg/dL — ABNORMAL HIGH (ref 70–99)
Glucose-Capillary: 117 mg/dL — ABNORMAL HIGH (ref 70–99)
Glucose-Capillary: 117 mg/dL — ABNORMAL HIGH (ref 70–99)
Glucose-Capillary: 126 mg/dL — ABNORMAL HIGH (ref 70–99)

## 2024-05-01 LAB — MAGNESIUM: Magnesium: 2 mg/dL (ref 1.7–2.4)

## 2024-05-01 MED ORDER — VANCOMYCIN HCL 1500 MG/300ML IV SOLN
1500.0000 mg | INTRAVENOUS | Status: DC
  Administered 2024-05-02 – 2024-05-03 (×2): 1500 mg via INTRAVENOUS
  Filled 2024-05-01 (×2): qty 300

## 2024-05-01 MED ORDER — PIPERACILLIN-TAZOBACTAM 3.375 G IVPB
3.3750 g | Freq: Three times a day (TID) | INTRAVENOUS | Status: DC
  Administered 2024-05-01 – 2024-05-05 (×13): 3.375 g via INTRAVENOUS
  Filled 2024-05-01 (×12): qty 50

## 2024-05-01 MED ORDER — VANCOMYCIN HCL 1500 MG/300ML IV SOLN
1500.0000 mg | Freq: Once | INTRAVENOUS | Status: AC
  Administered 2024-05-01: 1500 mg via INTRAVENOUS
  Filled 2024-05-01: qty 300

## 2024-05-01 NOTE — Progress Notes (Signed)
 [facility]                                                                              Bane Groll, is a 58 y.o. male, DOB - [birth year], FAO:[id number] Admit date - 04/10/2024    Outpatient Primary MD for the patient is [name], DO  LOS - 20  days  Chief Complaint  Patient presents with   Shortness of Breath       Brief summary   Patient is a 58 year old male with history of suspected lung CA, GERD, COPD, chronic pancreatitis with pseudocyst presented to the hospital with cough, abdominal pain.  He was found to have COPD exacerbation in the setting of COVID infection along with acute on chronic pancreatitis complicated by pseudocyst. He was placed on IV fluids, pain control, bronchodilators, diet was advanced On 5/17, patient noted to have abdominal distention with likely ileus, no SBO or obstruction. Continues to require NG tube and GI/GS follow up - Tube feeds started on 5/30 - Continues to have discomfort in chest that appears to be esophageal - 5/31-NG tube came out, continues to have some chest discomfort so we will get CTA    Assessment & Plan   Acute on chronic pancreatitis, intra-abdominal fluid collections, pseudocyst, pseudoaneurysm - CT abdomen on 5/19 showed perisplenic fluid collection, another fluid collection near the diaphragm, wall thickening and inflammation of the splenic flexure of the colon,  largest collection adjacent to the colon, 2.3x 4.4x 5.9 cm - General Surgery/GI following - Tolerating full liquids and having bowel movements - Repeat abdominal CT on 5/29 shows slight decrease in the size of the left upper quadrant fluid collection and possibly developing pseudocyst in the pancreatic tail. No current plan for surgery  Chest discomfort due to questionable pneumonia - Carafate  added along with Voltaren  gel for reproducible pain with palpation - CTA negative for PE but does show developing or worsening pneumonia -Vanco/Zosyn  for  possible pneumonia  Possible partial large bowel obstruction -per GS - Continue bowel regimen  descending colitis -Likely reactive  -stop abx for now and monitor  COVID infection - COVID was positive on 04/10/2024, currently no hypoxia or shortness of breath -Airborne precautions removed   COPD exacerbation, - Improved, no wheezing - Continue bronchodilators  RUL nodule, suspected lung Ca - Patient had a PET scan, last 2 biopsies were nondiagnostic, will need to follow-up outpatient.  Has seen radiation oncology recently and was recommended repeating another CT scan in 6 months  GERD/gastritis - Continue PPI   intermittent blurry vision - MRI brain is negative, recommended outpatient follow-up with ophthalmology  Hyponatremia - Improving  Hypokalemia Replace as needed       Code Status: Full code DVT Prophylaxis:  Place and maintain sequential compression device Start: 04/21/24 0558   Level of Care: Level of care: Telemetry Family Communication: Updated patient Disposition Plan:      Remains inpatient appropriate:       Consultants:   General Surgery GI Intervention radiology    Medications  amitriptyline   50 mg Oral QHS   B-complex with vitamin C  1 tablet Oral Daily  cyanocobalamin   1,000 mcg Oral Daily   diclofenac  Sodium  2 g Topical QID   feeding supplement  1 Container Oral TID BM   feeding supplement (PROSource TF20)  60 mL Per Tube Daily   fluticasone  furoate-vilanterol  1 puff Inhalation Daily   methocarbamol  (ROBAXIN ) injection  500 mg Intravenous Q8H   multivitamin with minerals  1 tablet Oral Daily   pantoprazole   40 mg Oral BID   polyethylene glycol  17 g Oral BID   pregabalin   150 mg Oral TID   sucralfate   1 g Oral Q6H   umeclidinium bromide   1 puff Inhalation Daily      Subjective:   Overall feeling better and feels like yesterday was the best Czaplicki he has had since he has been here  Objective:   Vitals:   04/30/24 0845  04/30/24 1951 05/01/24 0353 05/01/24 0400  BP:  115/72 117/79   Pulse:  97 85   Resp:  20 18   Temp:  98.1 F (36.7 C) 98.6 F (37 C)   TempSrc:  Oral Oral   SpO2: 97% 99% 97%   Weight:    71.8 kg  Height:        Intake/Output Summary (Last 24 hours) at 05/01/2024 1007 Last data filed at 05/01/2024 0400 Gross per 24 hour  Intake 1938.83 ml  Output 375 ml  Net 1563.83 ml     Wt Readings from Last 3 Encounters:  05/01/24 71.8 kg  03/10/24 79.9 kg  12/29/23 75.8 kg   Physical Exam  General: Appearance:     Overweight male in no acute distress     Lungs:      respirations unlabored, diminished, not on O2  Heart:    Normal heart rate.   MS:   All extremities are intact.   Neurologic:   Awake, alert     Data Reviewed:  I have personally reviewed following labs    CBC Lab Results  Component Value Date   WBC 13.5 (H) 04/30/2024   RBC 3.83 (L) 04/30/2024   HGB 11.2 (L) 04/30/2024   HCT 33.8 (L) 04/30/2024   MCV 88.3 04/30/2024   MCH 29.2 04/30/2024   PLT 625 (H) 04/30/2024   MCHC 33.1 04/30/2024   RDW 16.8 (H) 04/30/2024   LYMPHSABS 2.2 04/18/2024   MONOABS 1.6 (H) 04/18/2024   EOSABS 0.0 04/18/2024   BASOSABS 0.0 04/18/2024     Last metabolic panel Lab Results  Component Value Date   NA 131 (L) 04/30/2024   K 3.9 04/30/2024   CL 97 (L) 04/30/2024   CO2 26 04/30/2024   BUN 11 04/30/2024   CREATININE 1.10 04/30/2024   GLUCOSE 115 (H) 04/30/2024   GFRNONAA >60 04/30/2024   GFRAA 106 10/08/2020   CALCIUM 8.7 (L) 04/30/2024   PHOS 2.9 05/01/2024   PROT 6.8 04/25/2024   ALBUMIN  2.9 (L) 04/28/2024   LABGLOB 3.9 09/17/2022   AGRATIO 0.9 (L) 09/17/2022   BILITOT 1.7 (H) 04/25/2024   ALKPHOS 204 (H) 04/25/2024   AST 71 (H) 04/25/2024   ALT 49 (H) 04/25/2024   ANIONGAP 8 04/30/2024    CBG (last 3)  Recent Labs    05/01/24 0008 05/01/24 0353 05/01/24 0743  GLUCAP 114* 117* 108*      Coagulation Profile: No results for input(s): "INR", "PROTIME"  in the last 168 hours.   Radiology Studies: I have personally reviewed the imaging studies  CT Angio Chest Pulmonary Embolism (PE) W or WO Contrast  Result Date: 04/30/2024 CLINICAL DATA:  Shortness of breath EXAM: CT ANGIOGRAPHY CHEST WITH CONTRAST TECHNIQUE: Multidetector CT imaging of the chest was performed using the standard protocol during bolus administration of intravenous contrast. Multiplanar CT image reconstructions and MIPs were obtained to evaluate the vascular anatomy. RADIATION DOSE REDUCTION: This exam was performed according to the departmental dose-optimization program which includes automated exposure control, adjustment of the mA and/or kV according to patient size and/or use of iterative reconstruction technique. CONTRAST:  75mL OMNIPAQUE  IOHEXOL  350 MG/ML SOLN COMPARISON:  CT angiogram chest 04/10/2024. CT abdomen and pelvis 04/28/2024. FINDINGS: Cardiovascular: Satisfactory opacification of the pulmonary arteries to the segmental level. No evidence of pulmonary embolism. Normal heart size. No pericardial effusion. Mediastinum/Nodes: No enlarged mediastinal, hilar, or axillary lymph nodes. Thyroid  gland, trachea, and esophagus demonstrate no significant findings. Lungs/Pleura: There is a stable small left pleural effusion. There is increasing inferior left lower lobe consolidation. Severe emphysema is again seen in both lung apices. A ground-glass opacities in the right upper lobe have decreased, head not completely resolved. Lingular atelectasis or airspace disease appears unchanged. Upper Abdomen: There are chronic appearing subcapsular splenic fluid collections and wall thickening and inflammation of the visualized splenic flexure of the colon appear unchanged. Cholecystectomy clips are present. Rounded hypodense areas in the liver are stable. Musculoskeletal: No chest wall abnormality. No acute or significant osseous findings. Review of the MIP images confirms the above findings.  IMPRESSION: 1. No evidence for pulmonary embolism. 2. Increasing left lower lobe consolidation worrisome for pneumonia. 3. Stable small left pleural effusion. 4. Decreased ground-glass opacities in the right upper lobe. 5. Stable wall thickening and inflammation of the visualized splenic flexure of the colon. 6. Stable chronic appearing subcapsular splenic fluid collections. 7. Emphysema. Emphysema (ICD10-J43.9). Electronically Signed   By: Tyron Gallon M.D.   On: 04/30/2024 21:10   DG CHEST PORT 1 VIEW Result Date: 04/29/2024 CLINICAL DATA:  Aspiration into airway EXAM: PORTABLE CHEST 1 VIEW COMPARISON:  Chest x-ray 04/27/2024 FINDINGS: There is a small left pleural effusion with patchy left basilar opacities. Right lung is clear. Cardiomediastinal silhouette is within normal limits. No pneumothorax. No acute fractures. Cervical spinal fusion plate is present. IMPRESSION: Small left pleural effusion with patchy left basilar opacities, which may represent atelectasis or pneumonia. Electronically Signed   By: Tyron Gallon M.D.   On: 04/29/2024 22:31         Enrigue Harvard DO [facility] 05/01/2024, 10:07 AM  Available via Epic secure chat 7am-7pm After 7 pm, please refer to night coverage provider listed on amion.

## 2024-05-01 NOTE — Progress Notes (Signed)
 Pharmacy Antibiotic Note  Wesley [name] is a 58 y.o. male admitted on 04/10/2024 with acute on chronic pancreatitis. Patient has been on several antibiotics since admission for descending colitis and was recently transitioned to Unasyn  monotherapy on 5/30. CTA PE on 5/31 showing increasing LLL consolidation, concerning for pneumonia. Pharmacy has been consulted for vancomycin  and Zosyn  dosing.  Plan: Give vancomycin  1500mg  IV x1, then 1500mg  IV q24h thereafter (eAUC 464 using Scr 1.10 and Vd 0.72) Start Zosyn  3.375g IV q8h Vanc levels PRN Monitor renal function and overall clinical picture F/u MRSA PCR for ability to discontinue vancomycin    Height: 5\' 8"  (172.7 cm) Weight: 71.8 kg (158 lb 6.4 oz) IBW/kg (Calculated) : 68.4  Temp (24hrs), Avg:98.4 F (36.9 C), Min:98.1 F (36.7 C), Max:98.6 F (37 C)  Recent Labs  Lab 04/26/24 0432 04/27/24 0500 04/28/24 0458 04/29/24 0439 04/29/24 2031 04/30/24 0549  WBC 20.7* 18.7*  --  12.8* 13.2* 13.5*  CREATININE 1.01 1.16 1.05 0.97  --  1.10    Estimated Creatinine Clearance: 70 mL/min (by C-G formula based on SCr of 1.1 mg/dL).    No Known Allergies  Antimicrobials this admission: 5/11 cefepime  >> 5/13 5/11 vanc >> 5/13; 6/1 >> 5/11 flagyl  >> 5/20 5/13 Rocephin  >> 5/20 5/20 Zosyn  >> 5/28; 6/1 >> 5/30 Unasyn  >> 6/1  Dose adjustments this admission: N/A  Microbiology results: 5/11 BCx: NG x5 days 5/11 resp panel: COVID (+) 5/20 BCx: NG x5 days 5/25 BCx: NG x5 days 6/1 MRSA PCR: not yet collected   Thank you for allowing pharmacy to be a part of this patient's care.  Roselee Cong, PharmD Clinical Pharmacist  6/1/20257:41 AM

## 2024-05-01 NOTE — Progress Notes (Signed)
 Progress Note    ASSESSMENT AND PLAN:   Acute on chronic pancreatitis with immature intra-abdominal fluid collections/pseudocyst (splenic region) Colonic ileus with LUQ inflammation. No obstruction-appears to have resolved. Neg colon Jan 2023 LLL pneumonia on Vanco/Zosyn  Underlying COPD, RUL nodule (suspected lung Ca)  Plan: -Advance diet to low fat soft diet. -Ambulate -Keep K>4, Mg>2 -Minimize pain meds -The fluid collections are still immature and no window for cystgastrostomy per Dr Brice Campi.  These do not appear to be infected pseudocysts, hence hold off on CT drainage.  Rpt CT in 2-3 weeks.  For now, conservative treatment.  Dr. Elvin Hammer taking over the service tomorrow     SUBJECTIVE   Tolerating full liquid diet without any problems. NG tube came out on 5/31.  Patient had persistent chest discomfort. Neg CTA for PE.  Does show LLL pneumonia. Had several bowel movements yesterday and is able to pass flatus.   OBJECTIVE:     Vital signs in last 24 hours: Temp:  [98.1 F (36.7 C)-98.6 F (37 C)] 98.6 F (37 C) (06/01 0353) Pulse Rate:  [85-97] 85 (06/01 0353) Resp:  [18-20] 18 (06/01 0353) BP: (115-117)/(72-79) 117/79 (06/01 0353) SpO2:  [97 %-99 %] 97 % (06/01 0353) Weight:  [71.8 kg] 71.8 kg (06/01 0400) Last BM Date : 04/30/24 General:   Alert, well-developed, in NAD EENT:  Normal hearing, non icteric sclera, conjunctive pink.  Heart:  Regular rate and rhythm; no murmur.  No lower extremity edema   Pulm: Normal respiratory effort, lungs CTA bilaterally without wheezes or crackles. Abdomen:  Soft, nondistended, nontender.  Normal bowel sounds,.       Neurologic:  Alert and  oriented x4;  grossly normal neurologically. Psych:  Pleasant, cooperative.  Normal mood and affect.   Intake/Output from previous [place]: 05/31 0701 - 06/01 0700 In: 1938.8 [P.O.:240; I.V.:1304.8; IV Piggyback:394] Out: 375 [Urine:375] Intake/Output this shift: No intake/output  data recorded.  Lab Results: Recent Labs    04/29/24 0439 04/29/24 2031 04/30/24 0549  WBC 12.8* 13.2* 13.5*  HGB 11.0* 11.3* 11.2*  HCT 33.8* 36.3* 33.8*  PLT 713* 670* 625*   BMET Recent Labs    04/29/24 0439 04/30/24 0549  NA 131* 131*  K 3.8 3.9  CL 100 97*  CO2 25 26  GLUCOSE 133* 115*  BUN 8 11  CREATININE 0.97 1.10  CALCIUM 9.0 8.7*   LFT No results for input(s): "PROT", "ALBUMIN ", "AST", "ALT", "ALKPHOS", "BILITOT", "BILIDIR", "IBILI" in the last 72 hours. PT/INR No results for input(s): "LABPROT", "INR" in the last 72 hours. Hepatitis Panel No results for input(s): "HEPBSAG", "HCVAB", "HEPAIGM", "HEPBIGM" in the last 72 hours.  CT Angio Chest Pulmonary Embolism (PE) W or WO Contrast Result Date: 04/30/2024 CLINICAL DATA:  Shortness of breath EXAM: CT ANGIOGRAPHY CHEST WITH CONTRAST TECHNIQUE: Multidetector CT imaging of the chest was performed using the standard protocol during bolus administration of intravenous contrast. Multiplanar CT image reconstructions and MIPs were obtained to evaluate the vascular anatomy. RADIATION DOSE REDUCTION: This exam was performed according to the departmental dose-optimization program which includes automated exposure control, adjustment of the mA and/or kV according to patient size and/or use of iterative reconstruction technique. CONTRAST:  75mL OMNIPAQUE  IOHEXOL  350 MG/ML SOLN COMPARISON:  CT angiogram chest 04/10/2024. CT abdomen and pelvis 04/28/2024. FINDINGS: Cardiovascular: Satisfactory opacification of the pulmonary arteries to the segmental level. No evidence of pulmonary embolism. Normal heart size. No pericardial effusion. Mediastinum/Nodes: No enlarged mediastinal, hilar, or axillary lymph  nodes. Thyroid  gland, trachea, and esophagus demonstrate no significant findings. Lungs/Pleura: There is a stable small left pleural effusion. There is increasing inferior left lower lobe consolidation. Severe emphysema is again seen in  both lung apices. A ground-glass opacities in the right upper lobe have decreased, head not completely resolved. Lingular atelectasis or airspace disease appears unchanged. Upper Abdomen: There are chronic appearing subcapsular splenic fluid collections and wall thickening and inflammation of the visualized splenic flexure of the colon appear unchanged. Cholecystectomy clips are present. Rounded hypodense areas in the liver are stable. Musculoskeletal: No chest wall abnormality. No acute or significant osseous findings. Review of the MIP images confirms the above findings. IMPRESSION: 1. No evidence for pulmonary embolism. 2. Increasing left lower lobe consolidation worrisome for pneumonia. 3. Stable small left pleural effusion. 4. Decreased ground-glass opacities in the right upper lobe. 5. Stable wall thickening and inflammation of the visualized splenic flexure of the colon. 6. Stable chronic appearing subcapsular splenic fluid collections. 7. Emphysema. Emphysema (ICD10-J43.9). Electronically Signed   By: Tyron Gallon M.D.   On: 04/30/2024 21:10   DG CHEST PORT 1 VIEW Result Date: 04/29/2024 CLINICAL DATA:  Aspiration into airway EXAM: PORTABLE CHEST 1 VIEW COMPARISON:  Chest x-ray 04/27/2024 FINDINGS: There is a small left pleural effusion with patchy left basilar opacities. Right lung is clear. Cardiomediastinal silhouette is within normal limits. No pneumothorax. No acute fractures. Cervical spinal fusion plate is present. IMPRESSION: Small left pleural effusion with patchy left basilar opacities, which may represent atelectasis or pneumonia. Electronically Signed   By: Tyron Gallon M.D.   On: 04/29/2024 22:31     Principal Problem:   Peripancreatic fluid collection Active Problems:   Abdominal distention   Drug-induced constipation   Dilation of pancreatic duct   Acute on chronic pancreatitis (HCC)   Malnutrition of moderate degree   Chronic abdominal pain   History of pancreatitis   Other  partial intestinal obstruction (HCC)   Acute pancreatic fluid collection     LOS: 20 days     Magnus Schuller, MD 05/01/2024, 11:50 AM [facility] [phone]

## 2024-05-02 DIAGNOSIS — K861 Other chronic pancreatitis: Secondary | ICD-10-CM | POA: Diagnosis not present

## 2024-05-02 DIAGNOSIS — K863 Pseudocyst of pancreas: Secondary | ICD-10-CM | POA: Diagnosis not present

## 2024-05-02 DIAGNOSIS — K859 Acute pancreatitis without necrosis or infection, unspecified: Secondary | ICD-10-CM | POA: Diagnosis not present

## 2024-05-02 DIAGNOSIS — K567 Ileus, unspecified: Secondary | ICD-10-CM | POA: Diagnosis not present

## 2024-05-02 DIAGNOSIS — R1012 Left upper quadrant pain: Secondary | ICD-10-CM

## 2024-05-02 DIAGNOSIS — K8689 Other specified diseases of pancreas: Secondary | ICD-10-CM | POA: Diagnosis not present

## 2024-05-02 LAB — BASIC METABOLIC PANEL WITH GFR
Anion gap: 11 (ref 5–15)
BUN: 11 mg/dL (ref 6–20)
CO2: 23 mmol/L (ref 22–32)
Calcium: 9.3 mg/dL (ref 8.9–10.3)
Chloride: 99 mmol/L (ref 98–111)
Creatinine, Ser: 1.16 mg/dL (ref 0.61–1.24)
GFR, Estimated: >60 mL/min (ref >60)
Glucose, Bld: 130 mg/dL — ABNORMAL HIGH (ref 70–99)
Potassium: 4 mmol/L (ref 3.5–5.1)
Sodium: 133 mmol/L — ABNORMAL LOW (ref 135–145)

## 2024-05-02 LAB — CBC
HCT: 34 % — ABNORMAL LOW (ref 39.0–52.0)
Hemoglobin: 10.9 g/dL — ABNORMAL LOW (ref 13.0–17.0)
MCH: 28.8 pg (ref 26.0–34.0)
MCHC: 32.1 g/dL (ref 30.0–36.0)
MCV: 89.9 fL (ref 80.0–100.0)
Platelets: 550 10*3/uL — ABNORMAL HIGH (ref 150–400)
RBC: 3.78 MIL/uL — ABNORMAL LOW (ref 4.22–5.81)
RDW: 17 % — ABNORMAL HIGH (ref 11.5–15.5)
WBC: 11.1 10*3/uL — ABNORMAL HIGH (ref 4.0–10.5)
nRBC: 0 % (ref 0.0–0.2)

## 2024-05-02 LAB — MRSA NEXT GEN BY PCR, NASAL: MRSA by PCR Next Gen: NOT DETECTED

## 2024-05-02 LAB — GLUCOSE, CAPILLARY
Glucose-Capillary: 103 mg/dL — ABNORMAL HIGH (ref 70–99)
Glucose-Capillary: 110 mg/dL — ABNORMAL HIGH (ref 70–99)
Glucose-Capillary: 125 mg/dL — ABNORMAL HIGH (ref 70–99)
Glucose-Capillary: 137 mg/dL — ABNORMAL HIGH (ref 70–99)
Glucose-Capillary: 138 mg/dL — ABNORMAL HIGH (ref 70–99)
Glucose-Capillary: 143 mg/dL — ABNORMAL HIGH (ref 70–99)

## 2024-05-02 LAB — MAGNESIUM: Magnesium: 2.1 mg/dL (ref 1.7–2.4)

## 2024-05-02 LAB — PHOSPHORUS: Phosphorus: 3.4 mg/dL (ref 2.5–4.6)

## 2024-05-02 MED ORDER — DICLOFENAC SODIUM 1 % EX GEL
2.0000 g | Freq: Four times a day (QID) | CUTANEOUS | Status: DC
  Administered 2024-05-05 (×2): 2 g via TOPICAL
  Filled 2024-05-02: qty 100

## 2024-05-02 MED ORDER — METHOCARBAMOL 500 MG PO TABS
500.0000 mg | ORAL_TABLET | Freq: Three times a day (TID) | ORAL | Status: DC | PRN
  Administered 2024-05-03 – 2024-05-04 (×3): 500 mg via ORAL
  Filled 2024-05-02 (×3): qty 1

## 2024-05-02 MED ORDER — LIDOCAINE 5 % EX PTCH
1.0000 | MEDICATED_PATCH | CUTANEOUS | Status: DC
  Administered 2024-05-02: 1 via TRANSDERMAL
  Filled 2024-05-02 (×3): qty 1

## 2024-05-02 MED ORDER — ENSURE MAX PROTEIN PO LIQD
11.0000 [oz_av] | Freq: Two times a day (BID) | ORAL | Status: DC
  Administered 2024-05-02 – 2024-05-05 (×6): 11 [oz_av] via ORAL
  Filled 2024-05-02 (×6): qty 330

## 2024-05-02 NOTE — Progress Notes (Addendum)
 [facility] Progress Note  CC:  Panceatitis, ileus  Subjective:  Tolerated eggs and cereal for breakfast without worsening pain but overall still complains of 9/10 pain in the LUQ.  Some nausea but no vomiting.  Passing flatus and had two BMs yesterday.  Still using a lot of dilaudid  with 6 doses yesterday and 3 already today.  Objective:  Vital signs in last 24 hours: Temp:  [98 F (36.7 C)-98.3 F (36.8 C)] 98.1 F (36.7 C) (06/02 0455) Pulse Rate:  [85-91] 91 (06/02 0455) Resp:  [18-20] 19 (06/02 0455) BP: (117-137)/(80-87) 137/87 (06/02 0455) SpO2:  [95 %-98 %] 95 % (06/02 0455) Weight:  [72.9 kg] 72.9 kg (06/02 0500) Last BM Date : 04/30/24 General:  Alert, Well-developed, in NAD Heart:  Regular rate and rhythm; no murmurs Pulm:  CTAB.  No W/R/R. Abdomen:  Soft, non-distended.  BS present.  Left sided TTP. Neurologic:  Alert and oriented x 4;  grossly normal neurologically. Psych:  Alert and cooperative. Normal mood and affect.  Intake/Output from previous [place]: 06/01 0701 - 06/02 0700 In: 432.6 [IV Piggyback:432.6] Out: -   Lab Results: Recent Labs    04/29/24 2031 04/30/24 0549 05/02/24 0426  WBC 13.2* 13.5* 11.1*  HGB 11.3* 11.2* 10.9*  HCT 36.3* 33.8* 34.0*  PLT 670* 625* 550*   BMET Recent Labs    04/30/24 0549 05/02/24 0426  NA 131* 133*  K 3.9 4.0  CL 97* 99  CO2 26 23  GLUCOSE 115* 130*  BUN 11 11  CREATININE 1.10 1.16  CALCIUM 8.7* 9.3    CT Angio Chest Pulmonary Embolism (PE) W or WO Contrast Result Date: 04/30/2024 CLINICAL DATA:  Shortness of breath EXAM: CT ANGIOGRAPHY CHEST WITH CONTRAST TECHNIQUE: Multidetector CT imaging of the chest was performed using the standard protocol during bolus administration of intravenous contrast. Multiplanar CT image reconstructions and MIPs were obtained to evaluate the vascular anatomy. RADIATION DOSE REDUCTION: This exam was performed according to the departmental dose-optimization  program which includes automated exposure control, adjustment of the mA and/or kV according to patient size and/or use of iterative reconstruction technique. CONTRAST:  75mL OMNIPAQUE  IOHEXOL  350 MG/ML SOLN COMPARISON:  CT angiogram chest 04/10/2024. CT abdomen and pelvis 04/28/2024. FINDINGS: Cardiovascular: Satisfactory opacification of the pulmonary arteries to the segmental level. No evidence of pulmonary embolism. Normal heart size. No pericardial effusion. Mediastinum/Nodes: No enlarged mediastinal, hilar, or axillary lymph nodes. Thyroid  gland, trachea, and esophagus demonstrate no significant findings. Lungs/Pleura: There is a stable small left pleural effusion. There is increasing inferior left lower lobe consolidation. Severe emphysema is again seen in both lung apices. A ground-glass opacities in the right upper lobe have decreased, head not completely resolved. Lingular atelectasis or airspace disease appears unchanged. Upper Abdomen: There are chronic appearing subcapsular splenic fluid collections and wall thickening and inflammation of the visualized splenic flexure of the colon appear unchanged. Cholecystectomy clips are present. Rounded hypodense areas in the liver are stable. Musculoskeletal: No chest wall abnormality. No acute or significant osseous findings. Review of the MIP images confirms the above findings. IMPRESSION: 1. No evidence for pulmonary embolism. 2. Increasing left lower lobe consolidation worrisome for pneumonia. 3. Stable small left pleural effusion. 4. Decreased ground-glass opacities in the right upper lobe. 5. Stable wall thickening and inflammation of the visualized splenic flexure of the colon. 6. Stable chronic appearing subcapsular splenic fluid collections. 7. Emphysema. Emphysema (ICD10-J43.9). Electronically Signed   By: Rollen Clines.D.  On: 04/30/2024 21:10   Assessment / Plan: Acute on chronic pancreatitis with immature intra-abdominal fluid  collections/pseudocyst (splenic region) Colonic ileus with LUQ inflammation. No obstruction, appears to have resolved. Neg colon Jan 2023 LLL pneumonia on Vanco/Zosyn  Underlying COPD, RUL nodule (suspected lung Ca)   -Continue low fat/soft diet. -Ambulate -Keep K>4, Mg>2 -He is still using Dilaudid  quite regularly (6 times yesterday and 3 already today).  Will need to transition to PO at some point to see if he can manage pain that way. -The fluid collections are still immature and no window for cystgastrostomy per Dr Brice Campi.  These do not appear to be infected pseudocysts, hence hold off on CT drainage.  Rpt CT in 2-3 weeks.   For now, conservative treatment.     LOS: 21 days   Martina Sledge. Zehr  05/02/2024, 9:43 AM  GI ATTENDING  Interval history and data reviewed.  Case reviewed with Dr. Venice Gillis.  Agree with comprehensive interval progress note as outlined above.  Agree with ongoing supportive measures as outlined above.  Hopefully can tolerate reasonable diet have reasonable pain control which will go along with toward discharge.  Will follow.  Murel [place]. Willey Harrier., M.D. [facility] Division of Gastroenterology

## 2024-05-02 NOTE — Progress Notes (Addendum)
 Subjective/Chief Complaint: States Saturday he felt the best he has felt, but then L flank pain worsened again yesterday. Tolerating soft diet- says he ate 100% of breakfast (eggs, cereal). Reports 3 watery stools yesterday 6/1. Endorses a lot of flatus. Also has ongoing nausea without vomiting. Says he has been walking the halls.   Objective: Vital signs in last 24 hours: Temp:  [98 F (36.7 C)-98.3 F (36.8 C)] 98.1 F (36.7 C) (06/02 0455) Pulse Rate:  [85-91] 91 (06/02 0455) Resp:  [18-20] 19 (06/02 0455) BP: (117-137)/(80-87) 137/87 (06/02 0455) SpO2:  [95 %-98 %] 95 % (06/02 0455) Weight:  [72.9 kg] 72.9 kg (06/02 0500) Last BM Date : 04/30/24  Intake/Output from previous [place]: 06/01 0701 - 06/02 0700 In: 432.6 [IV Piggyback:432.6] Out: -  Intake/Output this shift: No intake/output data recorded. Patient sitting up in NAD, appears comfortable Normal respiratory rate and effort ORA Cortrak in place, clamped Abd: soft, mild distention (Improved compared to prior), LUQ/L upper flank tenderness without peritonitis (improved compared to prior), prior surgical scars noted    Lab Results:  Recent Labs    04/30/24 0549 05/02/24 0426  WBC 13.5* 11.1*  HGB 11.2* 10.9*  HCT 33.8* 34.0*  PLT 625* 550*   BMET Recent Labs    04/30/24 0549 05/02/24 0426  NA 131* 133*  K 3.9 4.0  CL 97* 99  CO2 26 23  GLUCOSE 115* 130*  BUN 11 11  CREATININE 1.10 1.16  CALCIUM 8.7* 9.3   PT/INR No results for input(s): "LABPROT", "INR" in the last 72 hours. ABG No results for input(s): "PHART", "HCO3" in the last 72 hours.  Invalid input(s): "PCO2", "PO2"  Studies/Results: CT Angio Chest Pulmonary Embolism (PE) W or WO Contrast Result Date: 04/30/2024 CLINICAL DATA:  Shortness of breath EXAM: CT ANGIOGRAPHY CHEST WITH CONTRAST TECHNIQUE: Multidetector CT imaging of the chest was performed using the standard protocol during bolus administration of intravenous contrast.  Multiplanar CT image reconstructions and MIPs were obtained to evaluate the vascular anatomy. RADIATION DOSE REDUCTION: This exam was performed according to the departmental dose-optimization program which includes automated exposure control, adjustment of the mA and/or kV according to patient size and/or use of iterative reconstruction technique. CONTRAST:  75mL OMNIPAQUE  IOHEXOL  350 MG/ML SOLN COMPARISON:  CT angiogram chest 04/10/2024. CT abdomen and pelvis 04/28/2024. FINDINGS: Cardiovascular: Satisfactory opacification of the pulmonary arteries to the segmental level. No evidence of pulmonary embolism. Normal heart size. No pericardial effusion. Mediastinum/Nodes: No enlarged mediastinal, hilar, or axillary lymph nodes. Thyroid  gland, trachea, and esophagus demonstrate no significant findings. Lungs/Pleura: There is a stable small left pleural effusion. There is increasing inferior left lower lobe consolidation. Severe emphysema is again seen in both lung apices. A ground-glass opacities in the right upper lobe have decreased, head not completely resolved. Lingular atelectasis or airspace disease appears unchanged. Upper Abdomen: There are chronic appearing subcapsular splenic fluid collections and wall thickening and inflammation of the visualized splenic flexure of the colon appear unchanged. Cholecystectomy clips are present. Rounded hypodense areas in the liver are stable. Musculoskeletal: No chest wall abnormality. No acute or significant osseous findings. Review of the MIP images confirms the above findings. IMPRESSION: 1. No evidence for pulmonary embolism. 2. Increasing left lower lobe consolidation worrisome for pneumonia. 3. Stable small left pleural effusion. 4. Decreased ground-glass opacities in the right upper lobe. 5. Stable wall thickening and inflammation of the visualized splenic flexure of the colon. 6. Stable chronic appearing subcapsular splenic fluid collections. 7.  Emphysema. Emphysema  (ICD10-J43.9). Electronically Signed   By: Tyron Gallon M.D.   On: 04/30/2024 21:10     Anti-infectives: Anti-infectives (From admission, onward)    Start     Dose/Rate Route Frequency Ordered Stop   05/02/24 0830  vancomycin  (VANCOREADY) IVPB 1500 mg/300 mL       Placed in "Followed by" Linked Group   1,500 mg 150 mL/hr over 120 Minutes Intravenous Every 24 hours 05/01/24 0734     05/01/24 1000  piperacillin -tazobactam (ZOSYN ) IVPB 3.375 g        3.375 g 12.5 mL/hr over 240 Minutes Intravenous Every 8 hours 05/01/24 0734     05/01/24 0830  vancomycin  (VANCOREADY) IVPB 1500 mg/300 mL       Placed in "Followed by" Linked Group   1,500 mg 150 mL/hr over 120 Minutes Intravenous  Once 05/01/24 0734 05/01/24 0956   04/29/24 2200  Ampicillin -Sulbactam (UNASYN ) 3 g in sodium chloride  0.9 % 100 mL IVPB  Status:  Discontinued        3 g 200 mL/hr over 30 Minutes Intravenous Every 6 hours 04/29/24 2017 05/01/24 0734   04/19/24 1000  piperacillin -tazobactam (ZOSYN ) IVPB 3.375 g  Status:  Discontinued        3.375 g 12.5 mL/hr over 240 Minutes Intravenous Every 8 hours 04/19/24 0854 04/27/24 1201   04/12/24 1000  cefTRIAXone  (ROCEPHIN ) 2 g in sodium chloride  0.9 % 100 mL IVPB  Status:  Discontinued        2 g 200 mL/hr over 30 Minutes Intravenous Daily 04/12/24 0758 04/19/24 0849   04/10/24 2100  metroNIDAZOLE  (FLAGYL ) IVPB 500 mg  Status:  Discontinued        500 mg 100 mL/hr over 60 Minutes Intravenous Every 12 hours 04/10/24 0840 04/19/24 0849   04/10/24 2100  vancomycin  (VANCOCIN ) IVPB 1000 mg/200 mL premix  Status:  Discontinued        1,000 mg 200 mL/hr over 60 Minutes Intravenous Every 12 hours 04/10/24 0940 04/12/24 0758   04/10/24 1600  ceFEPIme  (MAXIPIME ) 2 g in sodium chloride  0.9 % 100 mL IVPB  Status:  Discontinued        2 g 200 mL/hr over 30 Minutes Intravenous Every 8 hours 04/10/24 0940 04/12/24 0758   04/10/24 0830  metroNIDAZOLE  (FLAGYL ) IVPB 500 mg        500 mg 100  mL/hr over 60 Minutes Intravenous  Once 04/10/24 0819 04/10/24 1222   04/10/24 0800  vancomycin  (VANCOREADY) IVPB 1750 mg/350 mL        1,750 mg 175 mL/hr over 120 Minutes Intravenous  Once 04/10/24 0722 04/10/24 1120   04/10/24 0730  ceFEPIme  (MAXIPIME ) 2 g in sodium chloride  0.9 % 100 mL IVPB        2 g 200 mL/hr over 30 Minutes Intravenous  Once 04/10/24 0722 04/10/24 1430       Assessment/Plan: 58 y/o M with acute on chronic pancreatitis with peripancreatic fluid collections Pancreatic pseudoaneurysm  - IR evaluated and sees a pancreatic tail pseudoaneurysm, likely arising from splenic artery, and ordered a CTA to further evaluate. CTA shows two peripancreatic fluid collections that are felt to be thrombosed due to pseudoaneurysm and drainage/aspiration is not recommended. There are some subcapsular splenic fluid collections that they do not recommend draining. There is an additional fluid collection near the descending colon that is too small for drainage. - repeat CT 5/29 shows decrease in size of all fluid collections - Per GI - question splenic  infarct as other reason for inflammation - no acute surgical need but will follow    Possible partial large bowel obstruction Inflammatory stranding around the proximal colon this secondary to his pancreatitis.  Very mild upstream dilation and is tolerating p.o.  increase bowel regiment to miralax  BID and daily suppository PRN. - repeat CT 5/29 shows stable splenic flexure changes with proximal large bowel dilation.  - continue miralax  BID, daily suppositories - no emergent surgical needs, not clinically obstructed. General surgery will sign off. Call as needed.    Left chest pain - per primary service, HCAP, left pleural effusion   FEN - soft, IVF per primary VTE - SCD's ID - Zosyn  5/20-5/28 for intra-abd, Zosyn  resumed 6/1 for HCAP    - per [facility] -   COVID + COPD GERD Spinal stenosis - on lyrica   Tobacco abuse    LOS: 21 days     Charlott Converse PA-C 05/02/2024  I reviewed Consultant GI notes, hospitalist notes, last 24 h vitals and pain scores, last 48 h intake and output, last 24 h labs and trends, and last 24 h imaging results.   This care required moderate level of medical decision making.

## 2024-05-02 NOTE — Progress Notes (Signed)
 Nutrition Follow-up  DOCUMENTATION CODES:   Non-severe (moderate) malnutrition in context of acute illness/injury  INTERVENTION:   -Ensure MAX Protein po BID, each supplement provides 150 kcal and 30 grams of protein, 1.5g total fat.  -Consider resuming [name]  now that on a solid diet  -Continue B complex w/ Vitamin C daily  -Pancreatis nutrition therapy handout placed in AVS  NUTRITION DIAGNOSIS:   Moderate Malnutrition related to acute illness as evidenced by mild fat depletion, energy intake < 75% for > 7 days.  Ongoing.  GOAL:   Patient will meet greater than or equal to 90% of their needs  Progressing.  MONITOR:   TF tolerance, PO intake, Supplement acceptance  ASSESSMENT:   58 year old male with history of suspected lung CA, GERD, COPD, chronic pancreatitis with pseudocyst presented to the hospital with cough, abdominal pain.  He was found to have COPD exacerbation in the setting of COVID infection along with acute on chronic pancreatitis complicated by pseudocyst.  5/11: admitted 5/27: Cortrak placed in IR 5/31: Cortrak came out and removed 6/1: Soft diet  Patient now on soft diet, consumed eggs and cereal this morning. Still having pain and nausea. Will trial Ensure Max which provides high amounts of protein. Pt never reached goal rate of tube feeds before tube came out.    Admission weight: 175 lbs  Current weight: 160 lbs  Medications: B complex w/ Vitamin C, Vitamin B-12, Multivitamin with minerals daily, Miralax , Zofran , Carafate   Labs reviewed: CBGs: 110-137 Low Na  Diet Order:   Diet Order             DIET SOFT Room service appropriate? Yes; Fluid consistency: Thin  Diet effective now                   EDUCATION NEEDS:   Education needs have been addressed  Skin:  Skin Assessment: Reviewed RN Assessment  Last BM:  5/31 -type 5  Height:   Ht Readings from Last 1 Encounters:  04/10/24 5\' 8"  (1.727 m)    Weight:   Wt Readings  from Last 1 Encounters:  05/02/24 72.9 kg    BMI:  Body mass index is 24.44 kg/m.  Estimated Nutritional Needs:   Kcal:  2100-2300  Protein:  115-130g  Fluid:  2.1L/[place]   Arna Better, MS, RD, LDN Inpatient Clinical Dietitian Contact via Secure chat

## 2024-05-02 NOTE — Progress Notes (Signed)
 [facility]                                                                              Wesley [name], is a 58 y.o. male, DOB - 09/16/1966, UJW:[id number] Admit date - 04/10/2024    Outpatient Primary MD for the patient is [name], DO  LOS - 21  days  Chief Complaint  Patient presents with   Shortness of Breath       Brief summary   Patient is a 58 year old male with history of suspected lung CA, GERD, COPD, chronic pancreatitis with pseudocyst presented to the hospital with cough, abdominal pain.  He was found to have COPD exacerbation in the setting of COVID infection along with acute on chronic pancreatitis complicated by pseudocyst. Patient was also found to have developed a partial large bowel obstruction that required NG tube for short time that eventually came out.  He has been progressing with diet and bowel movements.  Follow-up on general surgery and GI recommendations.    Assessment & Plan   Acute on chronic pancreatitis, intra-abdominal fluid collections, pseudocyst, pseudoaneurysm - CT abdomen on 5/19 showed perisplenic fluid collection, another fluid collection near the diaphragm, wall thickening and inflammation of the splenic flexure of the colon,  largest collection adjacent to the colon, 2.3x 4.4x 5.9 cm - General Surgery/GI following - Tolerating diet and having bowel movements - Repeat abdominal CT on 5/29 shows slight decrease in the size of the left upper quadrant fluid collection and possibly developing pseudocyst in the pancreatic tail. No current plan for surgery or drainage  Chest discomfort due to questionable pneumonia - Vanco/Zosyn  for now De-escalate as able  Possible partial large bowel obstruction -per GS - Continue bowel regimen and diet  descending colitis -Likely reactive   COVID infection - COVID was positive on 04/10/2024, currently no hypoxia or shortness of breath -Airborne precautions removed   COPD  exacerbation, - Improved, no wheezing - Continue bronchodilators  RUL nodule, suspected lung Ca - Patient had a PET scan, last 2 biopsies were nondiagnostic, will need to follow-up outpatient.  Has seen radiation oncology recently and was recommended repeating another CT scan in 6 months  GERD/gastritis - Continue PPI   intermittent blurry vision - MRI brain is negative, recommended outpatient follow-up with ophthalmology  Hyponatremia - Improving  Hypokalemia Replace as needed       Code Status: Full code DVT Prophylaxis:  Place and maintain sequential compression device Start: 04/21/24 0558   Level of Care: Level of care: Telemetry Family Communication: Updated patient Disposition Plan:      Remains inpatient appropriate:       Consultants:   General Surgery GI Intervention radiology    Medications  amitriptyline   50 mg Oral QHS   B-complex with vitamin C  1 tablet Oral Daily   cyanocobalamin   1,000 mcg Oral Daily   diclofenac  Sodium  2 g Topical QID   fluticasone  furoate-vilanterol  1 puff Inhalation Daily   methocarbamol  (ROBAXIN ) injection  500 mg Intravenous Q8H   multivitamin with minerals  1 tablet Oral Daily   pantoprazole   40 mg Oral BID  polyethylene glycol  17 g Oral BID   pregabalin   150 mg Oral TID   Ensure Max Protein  11 oz Oral BID   sucralfate   1 g Oral Q6H   umeclidinium bromide   1 puff Inhalation Daily      Subjective:   No shortness of breath, improved chest pain Tolerating food  Objective:   Vitals:   05/01/24 1209 05/01/24 2010 05/02/24 0455 05/02/24 0500  BP: 122/80 117/84 137/87   Pulse: 85 88 91   Resp: 20 18 19    Temp: 98 F (36.7 C) 98.3 F (36.8 C) 98.1 F (36.7 C)   TempSrc: Oral Oral Oral   SpO2: 98% 96% 95%   Weight:    72.9 kg  Height:        Intake/Output Summary (Last 24 hours) at 05/02/2024 1204 Last data filed at 05/02/2024 0443 Gross per 24 hour  Intake 432.57 ml  Output --  Net 432.57 ml      Wt Readings from Last 3 Encounters:  05/02/24 72.9 kg  03/10/24 79.9 kg  12/29/23 75.8 kg   Physical Exam  General: Appearance:     Overweight male in no acute distress     Lungs:      respirations unlabored, diminished, not on O2  Heart:    Normal heart rate.   MS:   All extremities are intact.   Neurologic:   Awake, alert     Data Reviewed:  I have personally reviewed following labs    CBC Lab Results  Component Value Date   WBC 11.1 (H) 05/02/2024   RBC 3.78 (L) 05/02/2024   HGB 10.9 (L) 05/02/2024   HCT 34.0 (L) 05/02/2024   MCV 89.9 05/02/2024   MCH 28.8 05/02/2024   PLT 550 (H) 05/02/2024   MCHC 32.1 05/02/2024   RDW 17.0 (H) 05/02/2024   LYMPHSABS 2.2 04/18/2024   MONOABS 1.6 (H) 04/18/2024   EOSABS 0.0 04/18/2024   BASOSABS 0.0 04/18/2024     Last metabolic panel Lab Results  Component Value Date   NA 133 (L) 05/02/2024   K 4.0 05/02/2024   CL 99 05/02/2024   CO2 23 05/02/2024   BUN 11 05/02/2024   CREATININE 1.16 05/02/2024   GLUCOSE 130 (H) 05/02/2024   GFRNONAA >60 05/02/2024   GFRAA 106 10/08/2020   CALCIUM 9.3 05/02/2024   PHOS 3.4 05/02/2024   PROT 6.8 04/25/2024   ALBUMIN  2.9 (L) 04/28/2024   LABGLOB 3.9 09/17/2022   AGRATIO 0.9 (L) 09/17/2022   BILITOT 1.7 (H) 04/25/2024   ALKPHOS 204 (H) 04/25/2024   AST 71 (H) 04/25/2024   ALT 49 (H) 04/25/2024   ANIONGAP 11 05/02/2024    CBG (last 3)  Recent Labs    05/02/24 0453 05/02/24 0741 05/02/24 1141  GLUCAP 125* 110* 137*      Coagulation Profile: No results for input(s): "INR", "PROTIME" in the last 168 hours.   Radiology Studies: I have personally reviewed the imaging studies  CT Angio Chest Pulmonary Embolism (PE) W or WO Contrast Result Date: 04/30/2024 CLINICAL DATA:  Shortness of breath EXAM: CT ANGIOGRAPHY CHEST WITH CONTRAST TECHNIQUE: Multidetector CT imaging of the chest was performed using the standard protocol during bolus administration of intravenous  contrast. Multiplanar CT image reconstructions and MIPs were obtained to evaluate the vascular anatomy. RADIATION DOSE REDUCTION: This exam was performed according to the departmental dose-optimization program which includes automated exposure control, adjustment of the mA and/or kV according to patient size and/or  use of iterative reconstruction technique. CONTRAST:  75mL OMNIPAQUE  IOHEXOL  350 MG/ML SOLN COMPARISON:  CT angiogram chest 04/10/2024. CT abdomen and pelvis 04/28/2024. FINDINGS: Cardiovascular: Satisfactory opacification of the pulmonary arteries to the segmental level. No evidence of pulmonary embolism. Normal heart size. No pericardial effusion. Mediastinum/Nodes: No enlarged mediastinal, hilar, or axillary lymph nodes. Thyroid  gland, trachea, and esophagus demonstrate no significant findings. Lungs/Pleura: There is a stable small left pleural effusion. There is increasing inferior left lower lobe consolidation. Severe emphysema is again seen in both lung apices. A ground-glass opacities in the right upper lobe have decreased, head not completely resolved. Lingular atelectasis or airspace disease appears unchanged. Upper Abdomen: There are chronic appearing subcapsular splenic fluid collections and wall thickening and inflammation of the visualized splenic flexure of the colon appear unchanged. Cholecystectomy clips are present. Rounded hypodense areas in the liver are stable. Musculoskeletal: No chest wall abnormality. No acute or significant osseous findings. Review of the MIP images confirms the above findings. IMPRESSION: 1. No evidence for pulmonary embolism. 2. Increasing left lower lobe consolidation worrisome for pneumonia. 3. Stable small left pleural effusion. 4. Decreased ground-glass opacities in the right upper lobe. 5. Stable wall thickening and inflammation of the visualized splenic flexure of the colon. 6. Stable chronic appearing subcapsular splenic fluid collections. 7. Emphysema.  Emphysema (ICD10-J43.9). Electronically Signed   By: Tyron Gallon M.D.   On: 04/30/2024 21:10         Enrigue Harvard DO [facility] 05/02/2024, 12:04 PM  Available via Epic secure chat 7am-7pm After 7 pm, please refer to night coverage provider listed on amion.

## 2024-05-02 NOTE — Plan of Care (Signed)
  Problem: Education: Goal: Knowledge of General Education information will improve Description: Including pain rating scale, medication(s)/side effects and non-pharmacologic comfort measures Outcome: Progressing   Problem: Clinical Measurements: Goal: Diagnostic test results will improve Outcome: Progressing   Problem: Pain Managment: Goal: General experience of comfort will improve and/or be controlled Outcome: Progressing

## 2024-05-03 ENCOUNTER — Telehealth: Payer: Self-pay

## 2024-05-03 DIAGNOSIS — K8689 Other specified diseases of pancreas: Secondary | ICD-10-CM | POA: Diagnosis not present

## 2024-05-03 LAB — CBC
HCT: 33.4 % — ABNORMAL LOW (ref 39.0–52.0)
Hemoglobin: 10.7 g/dL — ABNORMAL LOW (ref 13.0–17.0)
MCH: 29.2 pg (ref 26.0–34.0)
MCHC: 32 g/dL (ref 30.0–36.0)
MCV: 91 fL (ref 80.0–100.0)
Platelets: 486 10*3/uL — ABNORMAL HIGH (ref 150–400)
RBC: 3.67 MIL/uL — ABNORMAL LOW (ref 4.22–5.81)
RDW: 17 % — ABNORMAL HIGH (ref 11.5–15.5)
WBC: 12.2 10*3/uL — ABNORMAL HIGH (ref 4.0–10.5)
nRBC: 0 % (ref 0.0–0.2)

## 2024-05-03 LAB — BASIC METABOLIC PANEL WITH GFR
Anion gap: 10 (ref 5–15)
BUN: 11 mg/dL (ref 6–20)
CO2: 21 mmol/L — ABNORMAL LOW (ref 22–32)
Calcium: 9 mg/dL (ref 8.9–10.3)
Chloride: 101 mmol/L (ref 98–111)
Creatinine, Ser: 0.91 mg/dL (ref 0.61–1.24)
GFR, Estimated: >60 mL/min (ref >60)
Glucose, Bld: 108 mg/dL — ABNORMAL HIGH (ref 70–99)
Potassium: 3.9 mmol/L (ref 3.5–5.1)
Sodium: 132 mmol/L — ABNORMAL LOW (ref 135–145)

## 2024-05-03 LAB — GLUCOSE, CAPILLARY
Glucose-Capillary: 112 mg/dL — ABNORMAL HIGH (ref 70–99)
Glucose-Capillary: 116 mg/dL — ABNORMAL HIGH (ref 70–99)
Glucose-Capillary: 119 mg/dL — ABNORMAL HIGH (ref 70–99)
Glucose-Capillary: 136 mg/dL — ABNORMAL HIGH (ref 70–99)
Glucose-Capillary: 145 mg/dL — ABNORMAL HIGH (ref 70–99)
Glucose-Capillary: 154 mg/dL — ABNORMAL HIGH (ref 70–99)
Glucose-Capillary: 98 mg/dL (ref 70–99)

## 2024-05-03 MED ORDER — PANCRELIPASE (LIP-PROT-AMYL) 12000-38000 UNITS PO CPEP
36000.0000 [IU] | ORAL_CAPSULE | ORAL | Status: DC
  Administered 2024-05-03 – 2024-05-05 (×2): 36000 [IU] via ORAL
  Filled 2024-05-03: qty 3
  Filled 2024-05-03: qty 1

## 2024-05-03 MED ORDER — PANCRELIPASE (LIP-PROT-AMYL) 12000-38000 UNITS PO CPEP
72000.0000 [IU] | ORAL_CAPSULE | Freq: Three times a day (TID) | ORAL | Status: DC
  Administered 2024-05-03 – 2024-05-05 (×5): 72000 [IU] via ORAL
  Filled 2024-05-03: qty 6
  Filled 2024-05-03 (×3): qty 2
  Filled 2024-05-03: qty 6
  Filled 2024-05-03: qty 2

## 2024-05-03 NOTE — Progress Notes (Addendum)
     [facility] Progress Note  CC:  Panceatitis, ileus   Subjective:  Still tolerating diet.  Passing flatus but no BM since the 3 that he had on 6/1.  No nausea or vomiting.  Still having pain on the left side, not improved from yesterday, still reaching a 9/10 on the pain scale at times.  Still using dilaudid .  Objective:  Vital signs in last 24 hours: Temp:  [98.2 F (36.8 C)-98.7 F (37.1 C)] 98.2 F (36.8 C) (06/03 0423) Pulse Rate:  [93-109] 94 (06/03 0423) Resp:  [18-20] 18 (06/03 0423) BP: (119-142)/(82-85) 120/82 (06/03 0423) SpO2:  [92 %-96 %] 94 % (06/03 0423) Weight:  [74 kg] 74 kg (06/03 0500) Last BM Date : 05/01/24 General:  Alert, Well-developed, in NAD Heart:  Regular rate and rhythm; no murmurs Pulm:  CTAB.  No W/R/R. Abdomen:  Soft, non-distended.  BS present.  Left sided TTP. Neurologic:  Alert and oriented x 4;  grossly normal neurologically. Psych:  Alert and cooperative. Normal mood and affect.  Intake/Output from previous [facility]: 06/02 0701 - 06/03 0700 In: 339.2 [IV Piggyback:339.2] Out: -   Lab Results: Recent Labs    05/02/24 0426 05/03/24 0451  WBC 11.1* 12.2*  HGB 10.9* 10.7*  HCT 34.0* 33.4*  PLT 550* 486*   BMET Recent Labs    05/02/24 0426 05/03/24 0451  NA 133* 132*  K 4.0 3.9  CL 99 101  CO2 23 21*  GLUCOSE 130* 108*  BUN 11 11  CREATININE 1.16 0.91  CALCIUM 9.3 9.0   Assessment / Plan: Acute on chronic pancreatitis with immature intra-abdominal fluid collections/pseudocyst (splenic region) Colonic ileus with LUQ inflammation. No obstruction, appears to have resolved. Neg colon Jan 2023 LLL pneumonia on Vanco/Zosyn  Underlying COPD, RUL nodule (suspected lung Ca)   -Continue low fat/soft diet. -Ambulate -Keep K>4, Mg>2 -I restarted his [name]  now that he is back eating food. -He is still using Dilaudid  quite regularly.  Will need to transition to PO at some point to see if he can manage pain that way.  I  discussed with Dr. Mikle Alexanders. -The fluid collections are still immature and no window for cystgastrostomy per Dr Brice Campi.  These do not appear to be infected pseudocysts, hence hold off on CT drainage.  Rpt CT in 2-3 weeks, which our office is arranging.   For now, conservative treatment.  GI signing off.  Call back if any questions or other issues arise.    LOS: 22 days   Martina Sledge. Zehr  05/03/2024, 9:22 AM  GI ATTENDING   Interval history data reviewed.  Patient seen and examined.  Agree with comprehensive interval progress note as outlined above.  Agree with impressions and recommendations as outlined.  No acute issues.  Follow-up being arranged.  Continue with supportive care as you are doing.  Will be ready to go home with he is taking adequate nutrition and has adequate pain management.  Otherwise, no further specific recommendations from GI perspective.  Will sign off.  Murel [place]. Willey Harrier., M.D. [facility] Division of Gastroenterology

## 2024-05-03 NOTE — Telephone Encounter (Signed)
 Mansouraty, Albino Alu., MD  Annis Kinder, DO; Zehr, Jessica D, PA-C; Aneita Keens, RN JZ and VC, I agree, CT abdomen/pelvis due to the location of this particular fluid going down the region of the descending colon makes most sense. 2 to 3-week with IV and oral contrast would be helpful to also determine ability for contrast to make its way into the distal colon. GM

## 2024-05-03 NOTE — Telephone Encounter (Signed)
Order has been entered and sent to the schedulers

## 2024-05-03 NOTE — Progress Notes (Signed)
 Mobility Specialist - Progress Note   05/03/24 1425  Mobility  Activity Ambulated with assistance in hallway  Level of Assistance Modified independent, requires aide device or extra time  Assistive Device Other (Comment) (IV Pole)  Distance Ambulated (ft) 750 ft  Activity Response Tolerated well  Mobility Referral Yes  Mobility visit 1 Mobility  Mobility Specialist Start Time (ACUTE ONLY) 1409  Mobility Specialist Stop Time (ACUTE ONLY) 1425  Mobility Specialist Time Calculation (min) (ACUTE ONLY) 16 min   Pt received in bed and agreeable to mobility. No complaints during session. Pt to bed after session with all needs met.    [facility]

## 2024-05-03 NOTE — Progress Notes (Signed)
 [facility]                                                                              Wesley [name], is a 58 y.o. male, DOB - 07-Mar-1966, RUE:[id number] Admit date - 04/10/2024    Outpatient Primary MD for the patient is [name], DO  LOS - 22  days  Chief Complaint  Patient presents with   Shortness of Breath       Brief summary   Patient is a 58 year old male with history of suspected lung CA, GERD, COPD, chronic pancreatitis with pseudocyst presented to the hospital with cough, abdominal pain.  He was found to have COPD exacerbation in the setting of COVID infection along with acute on chronic pancreatitis complicated by pseudocyst. Patient was also found to have developed a partial large bowel obstruction that required NG tube for short time that eventually came out.  He has been progressing with diet and bowel movements.  Follow-up on general surgery and GI recommendations.  Once pain controlled and tolerating diet will be going home with outpatient CT scan by GI and further recommendations after that.    Assessment & Plan   Acute on chronic pancreatitis, intra-abdominal fluid collections, pseudocyst, pseudoaneurysm - CT abdomen on 5/19 showed perisplenic fluid collection, another fluid collection near the diaphragm, wall thickening and inflammation of the splenic flexure of the colon,  largest collection adjacent to the colon, 2.3x 4.4x 5.9 cm - General Surgery/GI following - Tolerating diet and having bowel movements - Repeat abdominal CT on 5/29 shows slight decrease in the size of the left upper quadrant fluid collection and possibly developing pseudocyst in the pancreatic tail. No current plan for surgery or drainage  Chest discomfort due to questionable pneumonia - Vanco/Zosyn  for now - De-escalate as able  Possible partial large bowel obstruction -per GS - Continue bowel regimen and diet  descending colitis -Likely reactive   COVID  infection - COVID was positive on 04/10/2024, currently no hypoxia or shortness of breath -Airborne precautions removed  COPD exacerbation, - Improved, no wheezing - Continue bronchodilators  RUL nodule, suspected lung Ca - Patient had a PET scan, last 2 biopsies were nondiagnostic, will need to follow-up outpatient.  Has seen radiation oncology recently and was recommended repeating another CT scan in 6 months  GERD/gastritis - Continue PPI   intermittent blurry vision - MRI brain is negative, recommended outpatient follow-up with ophthalmology  Hyponatremia - Improving  Hypokalemia Replace as needed       Code Status: Full code DVT Prophylaxis:  Place and maintain sequential compression device Start: 04/21/24 0558   Level of Care: Level of care: Telemetry Family Communication: Updated patient Disposition Plan:      Remains inpatient appropriate:       Consultants:   General Surgery GI Intervention radiology    Medications  amitriptyline   50 mg Oral QHS   B-complex with vitamin C  1 tablet Oral Daily   cyanocobalamin   1,000 mcg Oral Daily   diclofenac  Sodium  2 g Topical QID   fluticasone  furoate-vilanterol  1 puff Inhalation Daily   lidocaine   1 patch Transdermal  Q24H   lipase/protease/amylase  36,000 Units Oral With snacks   lipase/protease/amylase  72,000 Units Oral TID WC   multivitamin with minerals  1 tablet Oral Daily   pantoprazole   40 mg Oral BID   polyethylene glycol  17 g Oral BID   pregabalin   150 mg Oral TID   Ensure Max Protein  11 oz Oral BID   umeclidinium bromide   1 puff Inhalation Daily      Subjective:   No further pain in his chest or chest wall pain is now in his left side of his abdomen  Objective:   Vitals:   05/02/24 1217 05/02/24 2021 05/03/24 0423 05/03/24 0500  BP: 119/82 (!) 142/85 120/82   Pulse: 93 (!) 109 94   Resp: 18 20 18    Temp: 98.4 F (36.9 C) 98.7 F (37.1 C) 98.2 F (36.8 C)   TempSrc:   Oral    SpO2: 92% 96% 94%   Weight:    74 kg  Height:        Intake/Output Summary (Last 24 hours) at 05/03/2024 1229 Last data filed at 05/02/2024 1305 Gross per 24 hour  Intake 339.17 ml  Output --  Net 339.17 ml     Wt Readings from Last 3 Encounters:  05/03/24 74 kg  03/10/24 79.9 kg  12/29/23 75.8 kg   Physical Exam  General: Appearance:     Overweight male in no acute distress     Lungs:      respirations unlabored, diminished, not on O2  Heart:    Normal heart rate.   MS:   All extremities are intact.   Neurologic:   Awake, alert     Data Reviewed:  I have personally reviewed following labs    CBC Lab Results  Component Value Date   WBC 12.2 (H) 05/03/2024   RBC 3.67 (L) 05/03/2024   HGB 10.7 (L) 05/03/2024   HCT 33.4 (L) 05/03/2024   MCV 91.0 05/03/2024   MCH 29.2 05/03/2024   PLT 486 (H) 05/03/2024   MCHC 32.0 05/03/2024   RDW 17.0 (H) 05/03/2024   LYMPHSABS 2.2 04/18/2024   MONOABS 1.6 (H) 04/18/2024   EOSABS 0.0 04/18/2024   BASOSABS 0.0 04/18/2024     Last metabolic panel Lab Results  Component Value Date   NA 132 (L) 05/03/2024   K 3.9 05/03/2024   CL 101 05/03/2024   CO2 21 (L) 05/03/2024   BUN 11 05/03/2024   CREATININE 0.91 05/03/2024   GLUCOSE 108 (H) 05/03/2024   GFRNONAA >60 05/03/2024   GFRAA 106 10/08/2020   CALCIUM 9.0 05/03/2024   PHOS 3.4 05/02/2024   PROT 6.8 04/25/2024   ALBUMIN  2.9 (L) 04/28/2024   LABGLOB 3.9 09/17/2022   AGRATIO 0.9 (L) 09/17/2022   BILITOT 1.7 (H) 04/25/2024   ALKPHOS 204 (H) 04/25/2024   AST 71 (H) 04/25/2024   ALT 49 (H) 04/25/2024   ANIONGAP 10 05/03/2024    CBG (last 3)  Recent Labs    05/03/24 0421 05/03/24 0741 05/03/24 1201  GLUCAP 119* 116* 112*      Coagulation Profile: No results for input(s): "INR", "PROTIME" in the last 168 hours.   Radiology Studies: I have personally reviewed the imaging studies  No results found.        Enrigue Harvard DO [facility] 05/03/2024, 12:29 PM  Available via Epic secure chat 7am-7pm After 7 pm, please refer to night coverage provider listed on amion.

## 2024-05-03 NOTE — Telephone Encounter (Signed)
-----   Message from Annis Kinder sent at 05/02/2024  4:24 PM EDT ----- I certainly defer to Dr. Marolyn Sis expertise on the ongoing management of his pancreatic pseudocyst, but would imagine repeat CT in the next 2-3 weeks to assess for cyst rind maturity there is the right move.  Thanks for taking care of him in house!  VC ----- Message ----- From: Cortez Dines, PA-C Sent: 05/02/2024   3:30 PM EDT To: Aneita Keens, RN; Normie Becton., MD; #  Patient likely can be discharged in the next [place] or 2 so wanted to try to get some follow-up in place.  Plans for repeat imaging in 2 to 3 weeks to reassess pancreatic fluid collections.  I am assuming we just want CT abdomen and pelvis with contrast?  Hopefully we can schedule that and then get him an office visit follow-up if needed, but that will likely be much further out.  Thank you,  Jess

## 2024-05-04 DIAGNOSIS — K8689 Other specified diseases of pancreas: Secondary | ICD-10-CM | POA: Diagnosis not present

## 2024-05-04 LAB — GLUCOSE, CAPILLARY
Glucose-Capillary: 106 mg/dL — ABNORMAL HIGH (ref 70–99)
Glucose-Capillary: 113 mg/dL — ABNORMAL HIGH (ref 70–99)
Glucose-Capillary: 86 mg/dL (ref 70–99)
Glucose-Capillary: 131 mg/dL — ABNORMAL HIGH (ref 70–99)
Glucose-Capillary: 117 mg/dL — ABNORMAL HIGH (ref 70–99)

## 2024-05-04 LAB — BASIC METABOLIC PANEL WITH GFR
Anion gap: 10 (ref 5–15)
BUN: 10 mg/dL (ref 6–20)
CO2: 22 mmol/L (ref 22–32)
Calcium: 9.2 mg/dL (ref 8.9–10.3)
Chloride: 100 mmol/L (ref 98–111)
Creatinine, Ser: 1.14 mg/dL (ref 0.61–1.24)
GFR, Estimated: >60 mL/min (ref >60)
Glucose, Bld: 138 mg/dL — ABNORMAL HIGH (ref 70–99)
Potassium: 4 mmol/L (ref 3.5–5.1)
Sodium: 132 mmol/L — ABNORMAL LOW (ref 135–145)

## 2024-05-04 LAB — CBC
HCT: 33.3 % — ABNORMAL LOW (ref 39.0–52.0)
Hemoglobin: 10.7 g/dL — ABNORMAL LOW (ref 13.0–17.0)
MCH: 28.8 pg (ref 26.0–34.0)
MCHC: 32.1 g/dL (ref 30.0–36.0)
MCV: 89.8 fL (ref 80.0–100.0)
Platelets: 487 10*3/uL — ABNORMAL HIGH (ref 150–400)
RBC: 3.71 MIL/uL — ABNORMAL LOW (ref 4.22–5.81)
RDW: 17 % — ABNORMAL HIGH (ref 11.5–15.5)
WBC: 12.6 10*3/uL — ABNORMAL HIGH (ref 4.0–10.5)
nRBC: 0 % (ref 0.0–0.2)

## 2024-05-04 NOTE — Progress Notes (Signed)
 Report called to receiving nurse for room 1309.  Patient informed of transfer and belongings bagged up and sent with patient.

## 2024-05-04 NOTE — Plan of Care (Signed)
  Problem: Education: Goal: Knowledge of risk factors and measures for prevention of condition will improve Outcome: Progressing   Problem: Coping: Goal: Psychosocial and spiritual needs will be supported Outcome: Progressing   Problem: Education: Goal: Knowledge of General Education information will improve Description: Including pain rating scale, medication(s)/side effects and non-pharmacologic comfort measures Outcome: Progressing   Problem: Clinical Measurements: Goal: Ability to maintain clinical measurements within normal limits will improve Outcome: Progressing Goal: Diagnostic test results will improve Outcome: Progressing Goal: Respiratory complications will improve Outcome: Progressing   Problem: Activity: Goal: Risk for activity intolerance will decrease Outcome: Progressing   Problem: Nutrition: Goal: Adequate nutrition will be maintained Outcome: Progressing   Problem: Coping: Goal: Level of anxiety will decrease Outcome: Progressing   Problem: Elimination: Goal: Will not experience complications related to bowel motility Outcome: Progressing   Problem: Pain Managment: Goal: General experience of comfort will improve and/or be controlled Outcome: Progressing

## 2024-05-04 NOTE — Progress Notes (Signed)
 PROGRESS NOTE    Wesley [name]  UJW:[id number] DOB: 1966-07-10 DOA: 04/10/2024 PCP: [name], DO   Brief Narrative:    Assessment and Plan:  Acute on chronic pancreatitis Intra-abdominal fluid collections Pseudocyst Noted on CT imaging. GI and general surgery consulted. From CT imaging, recommendation were to not attempt drainage/aspiration secondary to thrombosed collections in addition to many collections (subcapsular splenic fluid collections and fluid collection near the descending colon) too small to drain. Patient managed empirically with antibiotics with overall improvement of symptoms. Plan for repeat CT as an outpatient.  Possible pneumonia CT chest (5/31) significant for increasing left lower lobe consolidation concerning for pneumonia. Patient started empirically on Vancomycin /Zosyn  for management. -Continue Vancomycin /Zosyn  with plan to treat for 5-7 days of total antibiotics  Descending colitis Presumed reactive in setting of acute pancreatitis in addition to multiple fluid collections.  Still with symptoms. - Continue analgesics - Outpatient GI follow-up with plan for repeat CT imaging in 2 to 3 weeks  COPD exacerbation Patient managed with steroids and bronchodilators with improvement in symptoms. Resolved. -Continue Breo Ellipta , Incruse Ellipta  and Duonebs as needed  Possible partial large bowel obstruction General surgery and GI consulted. Patient managed initially on NG tube with eventual improvement of symptoms. Bowel regimen started for continued management.  Right upper lung nodule Concern for possible lung cancer.  Patient has a history of PET scan in addition to 2 nondiagnostic biopsies.  Patient will need continued outpatient follow-up/surveillance with prior plan for repeat CT scan in 6 months.  GERD Gastritis - Continue Protonix   Intermittent blurry vision Noted this admission.  MRI brain was obtained which was negative for etiology.   Recommendation for outpatient ophthalmology follow-up.  Hyponatremia Mild.  Hypokalemia Resolved.  Pseudoaneurysm Appears to be resolved on most recent CTA abdomen, consistent with thrombosed pseudoaneurysm.  COVID-19 infection Patient managed with steroids. Resolved. Isolation discontinued.   DVT prophylaxis: SCDs Code Status:   Code Status: Full Code Family Communication: None at bedside Disposition Plan: Discharge home pending transition off of IV analgesics   Consultants:  Gastroenterology General surgery  Procedures:  None  Antimicrobials: Zosyn  Vancomycin  Flagyl  Ceftriaxone  Unasyn     Subjective: Patient reports continued left sided abdominal pain. No epigastric pain. Pain is not worse with eating.  Objective: BP 101/74 (BP Location: Right Arm)   Pulse 94   Temp 98.2 F (36.8 C) (Oral)   Resp 15   Ht 5\' 8"  (1.727 m)   Wt 70.7 kg   SpO2 97%   BMI 23.70 kg/m   Examination:  General exam: Appears calm and comfortable Respiratory system: Clear to auscultation. Respiratory effort normal. Cardiovascular system: S1 & S2 heard, RRR. No murmurs, rubs, gallops or clicks. Gastrointestinal system: Abdomen is nondistended, soft and tender on left side. Normal bowel sounds heard. Central nervous system: Alert and oriented. No focal neurological deficits. Musculoskeletal: No edema. No calf tenderness Skin: No cyanosis. No rashes Psychiatry: Judgement and insight appear normal. Mood & affect appropriate.    Data Reviewed: I have personally reviewed following labs and imaging studies  CBC Lab Results  Component Value Date   WBC 12.6 (H) 05/04/2024   RBC 3.71 (L) 05/04/2024   HGB 10.7 (L) 05/04/2024   HCT 33.3 (L) 05/04/2024   MCV 89.8 05/04/2024   MCH 28.8 05/04/2024   PLT 487 (H) 05/04/2024   MCHC 32.1 05/04/2024   RDW 17.0 (H) 05/04/2024   LYMPHSABS 2.2 04/18/2024   MONOABS 1.6 (H) 04/18/2024   EOSABS 0.0 04/18/2024  BASOSABS 0.0 04/18/2024      Last metabolic panel Lab Results  Component Value Date   NA 132 (L) 05/04/2024   K 4.0 05/04/2024   CL 100 05/04/2024   CO2 22 05/04/2024   BUN 10 05/04/2024   CREATININE 1.14 05/04/2024   GLUCOSE 138 (H) 05/04/2024   GFRNONAA >60 05/04/2024   GFRAA 106 10/08/2020   CALCIUM 9.2 05/04/2024   PHOS 3.4 05/02/2024   PROT 6.8 04/25/2024   ALBUMIN  2.9 (L) 04/28/2024   LABGLOB 3.9 09/17/2022   AGRATIO 0.9 (L) 09/17/2022   BILITOT 1.7 (H) 04/25/2024   ALKPHOS 204 (H) 04/25/2024   AST 71 (H) 04/25/2024   ALT 49 (H) 04/25/2024   ANIONGAP 10 05/04/2024    GFR: Estimated Creatinine Clearance: 67.5 mL/min (by C-G formula based on SCr of 1.14 mg/dL).  Recent Results (from the past 240 hours)  MRSA Next Gen by PCR, Nasal     Status: None   Collection Time: 05/02/24  2:25 PM   Specimen: Nasal Mucosa; Nasal Swab  Result Value Ref Range Status   MRSA by PCR Next Gen NOT DETECTED NOT DETECTED Final    Comment: (NOTE) The GeneXpert MRSA Assay (FDA approved for NASAL specimens only), is one component of a comprehensive MRSA colonization surveillance program. It is not intended to diagnose MRSA infection nor to guide or monitor treatment for MRSA infections. Test performance is not FDA approved in patients less than 76 years old. Performed at [facility], [address], [place], [place] [address]       Radiology Studies: No results found.    LOS: 23 days    Aneita Keens, MD [facility] 05/04/2024, 3:05 PM   If 7PM-7AM, please contact night-coverage [email or web address]

## 2024-05-05 DIAGNOSIS — K8689 Other specified diseases of pancreas: Secondary | ICD-10-CM | POA: Diagnosis not present

## 2024-05-05 LAB — CBC
HCT: 33.1 % — ABNORMAL LOW (ref 39.0–52.0)
Hemoglobin: 10.8 g/dL — ABNORMAL LOW (ref 13.0–17.0)
MCH: 29 pg (ref 26.0–34.0)
MCHC: 32.6 g/dL (ref 30.0–36.0)
MCV: 89 fL (ref 80.0–100.0)
Platelets: 449 10*3/uL — ABNORMAL HIGH (ref 150–400)
RBC: 3.72 MIL/uL — ABNORMAL LOW (ref 4.22–5.81)
RDW: 16.7 % — ABNORMAL HIGH (ref 11.5–15.5)
WBC: 12.5 10*3/uL — ABNORMAL HIGH (ref 4.0–10.5)
nRBC: 0 % (ref 0.0–0.2)

## 2024-05-05 LAB — GLUCOSE, CAPILLARY
Glucose-Capillary: 109 mg/dL — ABNORMAL HIGH (ref 70–99)
Glucose-Capillary: 120 mg/dL — ABNORMAL HIGH (ref 70–99)
Glucose-Capillary: 130 mg/dL — ABNORMAL HIGH (ref 70–99)
Glucose-Capillary: 133 mg/dL — ABNORMAL HIGH (ref 70–99)

## 2024-05-05 MED ORDER — AMOXICILLIN-POT CLAVULANATE 875-125 MG PO TABS: 1.0000 | ORAL_TABLET | Freq: Two times a day (BID) | ORAL | 0 refills | Status: AC
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
## 2024-05-05 NOTE — Plan of Care (Signed)
 ?  Problem: Clinical Measurements: ?Goal: Ability to maintain clinical measurements within normal limits will improve ?Outcome: Progressing ?Goal: Will remain free from infection ?Outcome: Progressing ?Goal: Diagnostic test results will improve ?Outcome: Progressing ?  ?

## 2024-05-06 ENCOUNTER — Other Ambulatory Visit: Payer: Self-pay

## 2024-05-06 ENCOUNTER — Encounter (HOSPITAL_COMMUNITY): Payer: Self-pay | Admitting: *Deleted

## 2024-05-06 ENCOUNTER — Emergency Department (HOSPITAL_COMMUNITY)
Admission: EM | Admit: 2024-05-06 | Discharge: 2024-05-07 | Discharge status: Against Medical Advice | Attending: Emergency Medicine | Admitting: Emergency Medicine

## 2024-05-06 ENCOUNTER — Telehealth: Payer: Self-pay

## 2024-05-06 DIAGNOSIS — R109 Unspecified abdominal pain: Secondary | ICD-10-CM | POA: Diagnosis not present

## 2024-05-06 DIAGNOSIS — Z5321 Procedure and treatment not carried out due to patient leaving prior to being seen by health care provider: Secondary | ICD-10-CM | POA: Diagnosis not present

## 2024-05-06 NOTE — Telephone Encounter (Signed)
 Copied from CRM [id number]. Topic: Appointments - Scheduling Inquiry for Clinic >> May 06, 2024 10:51 AM Wynona Hedger wrote: Reason for CRM: Patient needs a hospital follow up appt, nothing before 6/24 was available please call the patient to schedule something sooner

## 2024-05-06 NOTE — Telephone Encounter (Signed)
 I called and spoke with patient, his appt is sch for 05/11/2024 @ 3:15.

## 2024-05-06 NOTE — ED Triage Notes (Signed)
 The pt has abd pain for over a month he reports the has been  at [facility] long as an in patient for one months he was just discharged yesterday

## 2024-05-06 NOTE — Transitions of Care (Post Inpatient/ED Visit) (Signed)
   05/06/2024  Name: Wesley [name] MRN: [id number] DOB: 26-May-1966  Today's TOC FU Call Status: Today's TOC FU Call Status:: Unsuccessful Call (1st Attempt) Unsuccessful Call (1st Attempt) Date: 05/06/24  Attempted to reach the patient regarding the most recent Inpatient/ED visit.  Follow Up Plan: Additional outreach attempts will be made to reach the patient to complete the Transitions of Care (Post Inpatient/ED visit) call.   Gareld June, BSN, RN [place]  VBCI - [employer] [facility] RN Care Manager [phone]

## 2024-05-07 LAB — CBC
HCT: 36.9 % — ABNORMAL LOW (ref 39.0–52.0)
Hemoglobin: 12 g/dL — ABNORMAL LOW (ref 13.0–17.0)
MCH: 28.4 pg (ref 26.0–34.0)
MCHC: 32.5 g/dL (ref 30.0–36.0)
MCV: 87.2 fL (ref 80.0–100.0)
Platelets: 542 10*3/uL — ABNORMAL HIGH (ref 150–400)
RBC: 4.23 MIL/uL (ref 4.22–5.81)
RDW: 16.7 % — ABNORMAL HIGH (ref 11.5–15.5)
WBC: 15.4 10*3/uL — ABNORMAL HIGH (ref 4.0–10.5)
nRBC: 0 % (ref 0.0–0.2)

## 2024-05-07 LAB — URINALYSIS, ROUTINE W REFLEX MICROSCOPIC
Bilirubin Urine: NEGATIVE
Glucose, UA: NEGATIVE mg/dL
Hgb urine dipstick: NEGATIVE
Ketones, ur: 5 mg/dL — AB
Leukocytes,Ua: NEGATIVE
Nitrite: NEGATIVE
Protein, ur: 100 mg/dL — AB
Specific Gravity, Urine: 1.027 (ref 1.005–1.030)
pH: 5 (ref 5.0–8.0)

## 2024-05-07 LAB — COMPREHENSIVE METABOLIC PANEL WITH GFR
ALT: 21 U/L (ref 0–44)
AST: 27 U/L (ref 15–41)
Albumin: 3.4 g/dL — ABNORMAL LOW (ref 3.5–5.0)
Alkaline Phosphatase: 90 U/L (ref 38–126)
Anion gap: 17 — ABNORMAL HIGH (ref 5–15)
BUN: 7 mg/dL (ref 6–20)
CO2: 20 mmol/L — ABNORMAL LOW (ref 22–32)
Calcium: 10.1 mg/dL (ref 8.9–10.3)
Chloride: 99 mmol/L (ref 98–111)
Creatinine, Ser: 1.05 mg/dL (ref 0.61–1.24)
GFR, Estimated: >60 mL/min (ref >60)
Glucose, Bld: 120 mg/dL — ABNORMAL HIGH (ref 70–99)
Potassium: 3.6 mmol/L (ref 3.5–5.1)
Sodium: 136 mmol/L (ref 135–145)
Total Bilirubin: 0.7 mg/dL (ref 0.0–1.2)
Total Protein: 9.1 g/dL — ABNORMAL HIGH (ref 6.5–8.1)

## 2024-05-07 LAB — LIPASE, BLOOD: Lipase: 274 U/L — ABNORMAL HIGH (ref 11–51)

## 2024-05-07 NOTE — ED Notes (Signed)
 Pt called for vitals, no response. Seen walking out by registration.

## 2024-05-07 NOTE — ED Notes (Signed)
Pt called for vitals, no response. Pt not seen in lobby ?

## 2024-05-08 ENCOUNTER — Emergency Department (HOSPITAL_COMMUNITY)
Admission: EM | Admit: 2024-05-08 | Discharge: 2024-05-08 | Disposition: A | Discharge status: Home / Self Care | Attending: Emergency Medicine | Admitting: Emergency Medicine

## 2024-05-08 ENCOUNTER — Other Ambulatory Visit: Payer: Self-pay

## 2024-05-08 ENCOUNTER — Encounter (HOSPITAL_COMMUNITY): Payer: Self-pay | Admitting: Emergency Medicine

## 2024-05-08 ENCOUNTER — Emergency Department (HOSPITAL_COMMUNITY)

## 2024-05-08 DIAGNOSIS — D72829 Elevated white blood cell count, unspecified: Secondary | ICD-10-CM | POA: Diagnosis not present

## 2024-05-08 DIAGNOSIS — Z743 Need for continuous supervision: Secondary | ICD-10-CM | POA: Diagnosis not present

## 2024-05-08 DIAGNOSIS — J449 Chronic obstructive pulmonary disease, unspecified: Secondary | ICD-10-CM | POA: Diagnosis not present

## 2024-05-08 DIAGNOSIS — K861 Other chronic pancreatitis: Secondary | ICD-10-CM | POA: Diagnosis not present

## 2024-05-08 DIAGNOSIS — R109 Unspecified abdominal pain: Secondary | ICD-10-CM | POA: Diagnosis not present

## 2024-05-08 DIAGNOSIS — K7689 Other specified diseases of liver: Secondary | ICD-10-CM | POA: Diagnosis not present

## 2024-05-08 DIAGNOSIS — K8591 Acute pancreatitis with uninfected necrosis, unspecified: Secondary | ICD-10-CM | POA: Diagnosis not present

## 2024-05-08 LAB — COMPREHENSIVE METABOLIC PANEL WITH GFR
ALT: 19 U/L (ref 0–44)
AST: 18 U/L (ref 15–41)
Albumin: 3.3 g/dL — ABNORMAL LOW (ref 3.5–5.0)
Alkaline Phosphatase: 82 U/L (ref 38–126)
Anion gap: 14 (ref 5–15)
BUN: 8 mg/dL (ref 6–20)
CO2: 21 mmol/L — ABNORMAL LOW (ref 22–32)
Calcium: 10.1 mg/dL (ref 8.9–10.3)
Chloride: 103 mmol/L (ref 98–111)
Creatinine, Ser: 0.98 mg/dL (ref 0.61–1.24)
GFR, Estimated: >60 mL/min (ref >60)
Glucose, Bld: 101 mg/dL — ABNORMAL HIGH (ref 70–99)
Potassium: 3.7 mmol/L (ref 3.5–5.1)
Sodium: 138 mmol/L (ref 135–145)
Total Bilirubin: 0.9 mg/dL (ref 0.0–1.2)
Total Protein: 9.2 g/dL — ABNORMAL HIGH (ref 6.5–8.1)

## 2024-05-08 LAB — CBC
HCT: 35.9 % — ABNORMAL LOW (ref 39.0–52.0)
Hemoglobin: 11.9 g/dL — ABNORMAL LOW (ref 13.0–17.0)
MCH: 28.4 pg (ref 26.0–34.0)
MCHC: 33.1 g/dL (ref 30.0–36.0)
MCV: 85.7 fL (ref 80.0–100.0)
Platelets: 514 10*3/uL — ABNORMAL HIGH (ref 150–400)
RBC: 4.19 MIL/uL — ABNORMAL LOW (ref 4.22–5.81)
RDW: 16.3 % — ABNORMAL HIGH (ref 11.5–15.5)
WBC: 12.5 10*3/uL — ABNORMAL HIGH (ref 4.0–10.5)
nRBC: 0 % (ref 0.0–0.2)

## 2024-05-08 LAB — MAGNESIUM: Magnesium: 1.8 mg/dL (ref 1.7–2.4)

## 2024-05-08 LAB — LIPASE, BLOOD: Lipase: 318 U/L — ABNORMAL HIGH (ref 11–51)

## 2024-05-08 MED ORDER — SODIUM CHLORIDE 0.9 % IV BOLUS
1000.0000 mL | Freq: Once | INTRAVENOUS | Status: AC
  Administered 2024-05-08: 1000 mL via INTRAVENOUS

## 2024-05-08 MED ORDER — IOHEXOL 350 MG/ML SOLN
75.0000 mL | Freq: Once | INTRAVENOUS | Status: AC | PRN
  Administered 2024-05-08: 75 mL via INTRAVENOUS

## 2024-05-08 MED ORDER — HYDROMORPHONE HCL 1 MG/ML IJ SOLN
1.0000 mg | Freq: Once | INTRAMUSCULAR | Status: AC
  Administered 2024-05-08: 1 mg via INTRAVENOUS
  Filled 2024-05-08: qty 1

## 2024-05-08 MED ORDER — ONDANSETRON HCL 4 MG/2ML IJ SOLN
4.0000 mg | Freq: Once | INTRAMUSCULAR | Status: AC
  Administered 2024-05-08: 4 mg via INTRAVENOUS
  Filled 2024-05-08: qty 2

## 2024-05-08 MED ORDER — OXYCODONE HCL 15 MG PO TABS
15.0000 mg | ORAL_TABLET | Freq: Three times a day (TID) | ORAL | 0 refills | Status: DC | PRN
Start: 2024-05-08 — End: 2024-05-12

## 2024-05-08 MED ORDER — OXYCODONE HCL 5 MG PO TABS
15.0000 mg | ORAL_TABLET | Freq: Once | ORAL | Status: AC
  Administered 2024-05-08: 15 mg via ORAL
  Filled 2024-05-08: qty 3

## 2024-05-08 MED ORDER — FAMOTIDINE IN NACL 20-0.9 MG/50ML-% IV SOLN
20.0000 mg | Freq: Once | INTRAVENOUS | Status: AC
  Administered 2024-05-08: 20 mg via INTRAVENOUS
  Filled 2024-05-08: qty 50

## 2024-05-08 NOTE — ED Provider Triage Note (Signed)
 Emergency Medicine Provider Triage Evaluation Note  Tri N Gielow , a 58 y.o. male  was evaluated in triage.  Pt complains of abdominal pain.  Discharged from hospital 3 days ago after management of SBO, pancreatitis, fluid collections, with antibiotics.  Says pain was never better at discharge.  No vomiting.  He has regular BM's/loose stools.  Pain 10/10  Review of Systems  Positive: Abdominal pain Negative: Constipation, fever  Physical Exam  BP 112/67 (BP Location: Left Arm)   Pulse (!) 109   Temp 97.8 F (36.6 C) (Oral)   Resp 20   Ht 5\' 8"  (1.727 m)   Wt 70 kg   SpO2 100%   BMI 23.46 kg/m  Gen:   Awake, appears uncomfortable Resp:  Normal effort  MSK:   Moves extremities without difficulty  Other:  Diffuse abdominal tenderness without distension  Medical Decision Making  Medically screening exam initiated at 2:06 PM.  Appropriate orders placed.  Brazen N Denicola was informed that the remainder of the evaluation will be completed by another provider, this initial triage assessment does not replace that evaluation, and the importance of remaining in the ED until their evaluation is complete.  Labs, IV fluids and pain meds ordered Repeat CT imaging given complicated recent course, multiple potential etiologies for worsening abdominal pain   Arvilla Birmingham, MD 05/08/24 1407

## 2024-05-08 NOTE — ED Provider Notes (Signed)
 [place] EMERGENCY DEPARTMENT AT [facility] Provider Note   CSN: [id number] Arrival date & time: 05/08/24  1346     History  Chief Complaint  Patient presents with   Abdominal Pain    Wesley [name] is a 58 y.o. male with a history of suspected lung cancer, COPD, chronic pancreatitis, presented ED with ongoing abdominal pain.  The patient was discharged from the hospital 3 days ago after a complicated history including acute pancreatitis, multiple intra-abdominal fluid collections, potential abdominal infection.  Potential pneumonia.  I reviewed those external records including hospital discharge summary.  The patient says he never felt like his abdominal pain had improved while in the hospital, and he is still nauseated.  He is able to eat and he reports he had a bowel movement yesterday and is passing gas. He ran out of his oxycodone  15 mg tablets recently and hasn't refilled them yet (PCP appointment next week)  For 05/05/24 hospital discharge records, the patient was diagnosed with possible pneumonia on CT, treated with antibiotics and Augmentin .  Possible descending colitis presumed reactive in the setting of acute pancreatitis.  Possible partial bowel obstruction with NG tube management by general surgery.  Lung nodule noted.  Patient also had acute on chronic pancreatitis with ultimate recommendation not to drain intra-abdominal collections were felt to be too small.  He was noted to also be hyponatremic, hypokalemic, and have a pseudoaneurysm thrombosis.  HPI     Home Medications Prior to Admission medications   Medication Sig Start Date End Date Taking? Authorizing Provider  acetaminophen  (TYLENOL ) 500 MG tablet Take 1,500 mg by mouth every 6 (six) hours as needed for mild pain (pain score 1-3) or moderate pain (pain score 4-6).   Yes [provider]  albuterol  (PROVENTIL ) (2.5 MG/3ML) 0.083% nebulizer solution Take 3 mLs (2.5 mg total) by nebulization every 6  (six) hours as needed for wheezing or shortness of breath. 04/22/23 05/08/24 Yes Parrett, Tammy S, NP  albuterol  (VENTOLIN  HFA) 108 (90 Base) MCG/ACT inhaler Inhale 2 puffs into the lungs every 6 (six) hours as needed for wheezing or shortness of breath. 07/14/23  Yes Raejean Bullock, NP  amitriptyline  ([name] ) 50 MG tablet Take 1 tablet (50 mg total) by mouth at bedtime. 12/23/23 12/17/24 Yes Cirigliano, Vito V, DO  amoxicillin -clavulanate (AUGMENTIN ) 875-125 MG tablet Take 1 tablet by mouth 2 (two) times daily for 3 days. 05/05/24 05/08/24 Yes Verlyn Goad, MD  bisacodyl  (DULCOLAX) 5 MG EC tablet Take 5 mg by mouth daily as needed for moderate constipation.   Yes [provider]  BREO ELLIPTA  200-25 MCG/ACT AEPB USE 1 INHALATION BY MOUTH DAILY Patient taking differently: Inhale 1 puff into the lungs in the morning. 03/08/24  Yes [name], DO  DULoxetine  (CYMBALTA ) 60 MG capsule TAKE 1 CAPSULE BY MOUTH DAILY Patient taking differently: Take 60 mg by mouth as needed. 10/24/22 05/08/24 Yes Vernell Goldsmith, MD  famotidine  (PEPCID ) 20 MG tablet Take 1 tablet (20 mg total) by mouth 2 (two) times daily. 12/16/23  Yes Masters, Psychiatric nurse, DO  lipase/protease/amylase ([name] ) 36000 UNITS CPEP capsule Take 2 capsules (72,000 Units total) by mouth 3 (three) times daily with meals AND 1 capsule (36,000 Units total) with snacks. Max: 9 capsules per Christian. 12/23/23  Yes Cirigliano, Vito V, DO  Multiple Vitamin (MULTIVITAMIN WITH MINERALS) TABS tablet Take 1 tablet by mouth in the morning.   Yes [provider]  ondansetron  (ZOFRAN ) 4 MG tablet Take 1  tablet (4 mg total) by mouth every 8 (eight) hours as needed for nausea or vomiting. 09/01/23 08/31/24 Yes [name], DO  oxyCODONE  (ROXICODONE ) 15 MG immediate release tablet Take 1 tablet (15 mg total) by mouth every 8 (eight) hours as needed for up to 21 doses for pain. 05/08/24  Yes Shaquitta Burbridge, Janalyn Me, MD  oxycodone  (ROXICODONE ) 30 MG immediate release  tablet Take 15 mg by mouth 3 (three) times daily as needed for pain. 09/20/22  Yes [provider]  pantoprazole  (PROTONIX ) 40 MG tablet TAKE 1 TABLET BY MOUTH TWICE  DAILY BEFORE MEALS 02/26/24  Yes [name], DO  polyethylene glycol (MIRALAX  / GLYCOLAX ) 17 g packet Take 17 g by mouth daily as needed (constipation).   Yes [provider]  pregabalin  (LYRICA ) 150 MG capsule Take 1 capsule (150 mg total) by mouth in the morning, at noon, and at bedtime. 10/21/23  Yes [name], DO  tamsulosin  (FLOMAX ) 0.4 MG CAPS capsule TAKE 1 CAPSULE BY MOUTH DAILY Patient taking differently: Take 0.4 mg by mouth at bedtime as needed. 02/26/24  Yes [name], DO  vitamin B-12 (CYANOCOBALAMIN ) 1000 MCG tablet Take 1 tablet (1,000 mcg total) by mouth daily. Patient taking differently: Take 1,000 mcg by mouth in the morning. 03/20/22  Yes Vernell Goldsmith, MD  Vitamin D , Ergocalciferol , (DRISDOL ) 1.25 MG (50000 UNIT) CAPS capsule Take 1 capsule (50,000 Units total) by mouth every 7 (seven) days. Patient taking differently: Take 50,000 Units by mouth every 7 (seven) days. On Wednesdays 03/20/22  Yes Vernell Goldsmith, MD      Allergies    Patient has no known allergies.    Review of Systems   Review of Systems  Physical Exam Updated Vital Signs BP 125/89   Pulse 89   Temp 98.8 F (37.1 C) (Oral)   Resp 18   Ht 5\' 8"  (1.727 m)   Wt 70 kg   SpO2 100%   BMI 23.46 kg/m  Physical Exam Constitutional:      General: He is in acute distress.  HENT:     Head: Normocephalic and atraumatic.  Eyes:     Conjunctiva/sclera: Conjunctivae normal.     Pupils: Pupils are equal, round, and reactive to light.  Cardiovascular:     Rate and Rhythm: Regular rhythm. Tachycardia present.  Pulmonary:     Effort: Pulmonary effort is normal. No respiratory distress.  Abdominal:     General: There is no distension.     Tenderness: There is generalized abdominal tenderness.  Skin:     General: Skin is warm and dry.  Neurological:     General: No focal deficit present.     Mental Status: He is alert. Mental status is at baseline.  Psychiatric:        Mood and Affect: Mood normal.        Behavior: Behavior normal.     ED Results / Procedures / Treatments   Labs (all labs ordered are listed, but only abnormal results are displayed) Labs Reviewed  LIPASE, BLOOD - Abnormal; Notable for the following components:      Result Value   Lipase 318 (*)    All other components within normal limits  COMPREHENSIVE METABOLIC PANEL WITH GFR - Abnormal; Notable for the following components:   CO2 21 (*)    Glucose, Bld 101 (*)    Total Protein 9.2 (*)    Albumin  3.3 (*)    All other components within normal limits  CBC - Abnormal; Notable for the following components:   WBC 12.5 (*)    RBC 4.19 (*)    Hemoglobin 11.9 (*)    HCT 35.9 (*)    RDW 16.3 (*)    Platelets 514 (*)    All other components within normal limits  MAGNESIUM     EKG None  Radiology CT ABDOMEN PELVIS W CONTRAST Result Date: 05/08/2024 CLINICAL DATA:  Abdominal pain, acute, nonlocalized hx of bowel obstruction, pancreatitis, abd fluid collections, recent hospitalization returning with worsening pain EXAM: CT ABDOMEN AND PELVIS WITH CONTRAST TECHNIQUE: Multidetector CT imaging of the abdomen and pelvis was performed using the standard protocol following bolus administration of intravenous contrast. RADIATION DOSE REDUCTION: This exam was performed according to the departmental dose-optimization program which includes automated exposure control, adjustment of the mA and/or kV according to patient size and/or use of iterative reconstruction technique. CONTRAST:  75mL OMNIPAQUE  IOHEXOL  350 MG/ML SOLN COMPARISON:  CT scan abdomen and pelvis from 04/28/2024. FINDINGS: Lower chest: There is significant interval decrease in the left pleural effusion. Only small left pleural effusion seen on today's exam. There are  associated compressive atelectatic changes at the left lung base, also improved since the prior study. There is new peripheral linear area of atelectasis in the inferior lingula. No right pleural effusion. Normal cardiac size. No pericardial effusion. Hepatobiliary: The liver is normal in size. Non-cirrhotic configuration. No suspicious mass. There are stable simple cysts in the liver with largest measuring up 1.5 x 1.7 cm. No intrahepatic or extrahepatic bile duct dilation. Gallbladder is surgically absent. Pancreas: Redemonstration of heterogeneous hypoattenuating area within the pancreatic tail, which may represent acute necrotic collection versus walled-off necrosis. The rest of the pancreas is otherwise within normal limits. Note is made of mild dilated main pancreatic duct, similar to the prior study. No peripancreatic fat stranding. Spleen: Redemonstration of multiple peripheral/subcapsular hypoattenuating collections in the spleen, grossly similar to the prior study. Adrenals/Urinary Tract: Adrenal glands are unremarkable. No suspicious renal mass. No hydronephrosis. No renal or ureteric calculi. Unremarkable urinary bladder. Stomach/Bowel: No disproportionate dilation of the small or large bowel loops. No evidence of abnormal bowel wall thickening or inflammatory changes. The appendix is unremarkable. Vascular/Lymphatic: Redemonstration of extensive heterogeneous collections in the left upper quadrant surrounding the pancreatic tail, spleen and abutting the stomach and splenic flexure of colon, without significant interval change. No pneumoperitoneum. No abdominal or pelvic lymphadenopathy, by size criteria. No aneurysmal dilation of the major abdominal arteries. There are moderate peripheral atherosclerotic vascular calcifications of the aorta and its major branches. Reproductive: Normal size prostate. Symmetric seminal vesicles. Other: The visualized soft tissues and abdominal wall are unremarkable.  Musculoskeletal: No suspicious osseous lesions. There are mild - moderate multilevel degenerative changes in the visualized spine. IMPRESSION: 1. No acute inflammatory process identified within the abdomen or pelvis. 2. Redemonstration of extensive heterogeneous collections in the left upper quadrant, without significant interval change. 3. Interval decrease in the left pleural effusion and associated atelectatic changes at the left lung base. 4. Multiple other nonacute observations, as described above. Aortic Atherosclerosis (ICD10-I70.0). Electronically Signed   By: Beula Brunswick M.D.   On: 05/08/2024 16:34    Procedures Procedures    Medications Ordered in ED Medications  HYDROmorphone  (DILAUDID ) injection 1 mg (1 mg Intravenous Given 05/08/24 1417)  sodium chloride  0.9 % bolus 1,000 mL (0 mLs Intravenous Stopped 05/08/24 1633)  iohexol  (OMNIPAQUE ) 350 MG/ML injection 75 mL (75 mLs Intravenous Contrast Given 05/08/24 1604)  famotidine  (PEPCID )  IVPB 20 mg premix (0 mg Intravenous Stopped 05/08/24 1945)  HYDROmorphone  (DILAUDID ) injection 1 mg (1 mg Intravenous Given 05/08/24 1800)  ondansetron  (ZOFRAN ) injection 4 mg (4 mg Intravenous Given 05/08/24 1801)  sodium chloride  0.9 % bolus 1,000 mL (0 mLs Intravenous Stopped 05/08/24 1945)  oxyCODONE  (Oxy IR/ROXICODONE ) immediate release tablet 15 mg (15 mg Oral Given 05/08/24 2002)    ED Course/ Medical Decision Making/ A&P Clinical Course as of 05/08/24 2141  Sun May 08, 2024  1735 Hospitalist to consult on patient [MT]  1919 Patient has PCP follow-up next week as well as GI follow-up.  He reports that his pain is better and does not want to stay in the hospital.  I suspect this is chronic ongoing pancreatitis and the hospitalist agreed at the bedside.  Patient reports that he ran out of his pain medicine, which I confirmed was filled most recently in April for oxycodone  15 mg tablets 3 times a Coad.  I provided him a week prescription for that, as I do think  he needs better pain control at home.  But he is not having intractable vomiting, or constipation issues, I think it is reasonable to continue to manage at home. [MT]    Clinical Course User Index [MT] Tylor Gambrill, Janalyn Me, MD                                 Medical Decision Making Amount and/or Complexity of Data Reviewed Labs: ordered. Radiology: ordered.  Risk Prescription drug management.   This patient presents to the ED with concern for abdominal pain. This involves an extensive number of treatment options, and is a complaint that carries with it a high risk of complications and morbidity.  The differential diagnosis includes acute recurrent or worsening pancreatitis versus gastritis versus biliary disease versus intra-abdominal infection or colitis versus ileus versus other  Co-morbidities that complicate the patient evaluation: History of recent hospitalization for pancreatitis, bowel obstruction ileus, at risk of recurrence  External records from outside source obtained and reviewed including hospital discharge summary as noted above  I ordered and personally interpreted labs.  The pertinent results include: Lipase elevated at 318.  Liver enzymes within normal limits.  Magnesium  and potassium normal.  Creatinine normal 0.9.  White blood cell, and elevation at 12.5, downtrending from recent hospitalization.  I ordered imaging studies including CT abdomen pelvis I independently visualized and interpreted imaging which showed no emergent findings or changes from prior scans I agree with the radiologist interpretation  The patient was maintained on a cardiac monitor.  I personally viewed and interpreted the cardiac monitored which showed an underlying rhythm of: NSR  I ordered medication including IV pain medicine and IV fluids  I have reviewed the patients home medicines and have made adjustments as needed  Test Considered: Low suspicion for mesenteric ischemia  After the  interventions noted above, I reevaluated the patient and found that they have: improved  Hospitalization was considered but ultimately outpatient management pursued.  See ED course.        Final Clinical Impression(s) / ED Diagnoses Final diagnoses:  Chronic pancreatitis, unspecified pancreatitis type (HCC)    Rx / DC Orders ED Discharge Orders          Ordered    oxyCODONE  (ROXICODONE ) 15 MG immediate release tablet  Every 8 hours PRN        05/08/24 1919  Arvilla Birmingham, MD 05/08/24 2141

## 2024-05-08 NOTE — ED Triage Notes (Signed)
 Pt arrives via EMS from home with reports of abd pain. States he has been in hospital for a month with pancreatitis. Also endorses chills.

## 2024-05-09 ENCOUNTER — Telehealth: Payer: Self-pay

## 2024-05-09 NOTE — Addendum Note (Signed)
 Addended by: Nicoles Sedlacek N on: 05/09/2024 01:15 PM   Modules accepted: Orders

## 2024-05-09 NOTE — Transitions of Care (Post Inpatient/ED Visit) (Signed)
 05/09/2024  Name: Wesley [name] MRN: [id number] DOB: 06-17-1966  Today's TOC FU Call Status: Today's TOC FU Call Status:: Successful TOC FU Call Completed Unsuccessful Call (1st Attempt) Date: 05/06/24 [facility] FU Call Complete Date: 05/09/24 Patient's Name and Date of Birth confirmed.  Transition Care Management Follow-up Telephone Call Date of Discharge: 05/05/24 Discharge Facility: [facility]) Type of Discharge: Inpatient Admission Primary Inpatient Discharge Diagnosis:: Pancreatitis How have you been since you were released from the hospital?: Same Any questions or concerns?: No  Items Reviewed: Did you receive and understand the discharge instructions provided?: Yes Medications obtained,verified, and reconciled?: Yes (Medications Reviewed) Any new allergies since your discharge?: No Dietary orders reviewed?: Yes Type of Diet Ordered:: Low Sodium Heart Healthy Do you have support at home?: Yes People in Home [RPT]: significant other Name of Support/Comfort Primary Source: Fiancee and grandkids  Medications Reviewed Today: Medications Reviewed Today     Reviewed by Claudene Crystal, RN (Case Manager) on 05/09/24 at 1357  Med List Status: <None>   Medication Order Taking? Sig Documenting Provider Last Dose Status Informant  acetaminophen  (TYLENOL ) 500 MG tablet [id number] No Take 1,500 mg by mouth every 6 (six) hours as needed for mild pain (pain score 1-3) or moderate pain (pain score 4-6). [provider] 05/08/2024 Active Pharmacy Records, Self  albuterol  (PROVENTIL ) (2.5 MG/3ML) 0.083% nebulizer solution [id number] No Take 3 mLs (2.5 mg total) by nebulization every 6 (six) hours as needed for wheezing or shortness of breath. Parrett, Macdonald Savoy, NP Unknown Expired 05/08/24 2359 Self, Pharmacy Records  albuterol  (VENTOLIN  HFA) 108 (90 Base) MCG/ACT inhaler [id number] No Inhale 2 puffs into the lungs every 6 (six) hours as needed for wheezing or shortness of breath. Raejean Bullock, NP 05/07/2024 Active Self, Pharmacy Records  amitriptyline  ([name] ) 50 MG tablet [id number] No Take 1 tablet (50 mg total) by mouth at bedtime. Cirigliano, Vito V, DO 05/07/2024 Active Self, Pharmacy Records  bisacodyl  (DULCOLAX) 5 MG EC tablet [id number] No Take 5 mg by mouth daily as needed for moderate constipation. [provider] Unknown Active Self, Pharmacy Records  BREO ELLIPTA  200-25 MCG/ACT AEPB [id number] No USE 1 INHALATION BY MOUTH DAILY  Patient taking differently: Inhale 1 puff into the lungs in the morning.   [name], DO 05/08/2024 Active Self, Pharmacy Records  DULoxetine  (CYMBALTA ) 60 MG capsule [id number] No TAKE 1 CAPSULE BY MOUTH DAILY  Patient taking differently: Take 60 mg by mouth as needed.   Vernell Goldsmith, MD 05/07/2024 Expired 05/08/24 2359 Self, Pharmacy Records           Med Note (CRUTHIS, CHLOE C   Sun Apr 10, 2024 10:03 AM) No fill hx. Pt is adamant he is taking this medication.  famotidine  (PEPCID ) 20 MG tablet [id number] No Take 1 tablet (20 mg total) by mouth 2 (two) times daily. Masters, Alston Jerry, DO Unknown Active Self, Pharmacy Records  lipase/protease/amylase ([name] ) 36000 UNITS CPEP capsule [id number] No Take 2 capsules (72,000 Units total) by mouth 3 (three) times daily with meals AND 1 capsule (36,000 Units total) with snacks. Max: 9 capsules per Mehlhoff. Cirigliano, Vito V, DO 05/07/2024 Active Self, Pharmacy Records  Multiple Vitamin (MULTIVITAMIN WITH MINERALS) TABS tablet [id number] No Take 1 tablet by mouth in the morning. [provider] 05/07/2024 Active Self, Pharmacy Records  ondansetron  (ZOFRAN ) 4 MG tablet [id number] No Take 1 tablet (4 mg total) by mouth every 8 (eight) hours as needed for nausea or vomiting. [name], DO 05/07/2024  Active Self, Pharmacy Records  oxyCODONE  (ROXICODONE ) 15 MG immediate release tablet [id number]  Take 1 tablet (15 mg total) by mouth every 8 (eight) hours as needed for up to 21 doses for pain. Arvilla Birmingham, MD  Active   oxycodone  (ROXICODONE ) 30 MG immediate release tablet [id number] No Take 15 mg by mouth 3 (three) times daily as needed for pain. [provider] Past Month Active Self, Pharmacy Records  pantoprazole  (PROTONIX ) 40 MG tablet [id number] No TAKE 1 TABLET BY MOUTH TWICE  DAILY BEFORE MEALS [name], DO 05/07/2024 Active Self, Pharmacy Records  polyethylene glycol (MIRALAX  / GLYCOLAX ) 17 g packet [id number] No Take 17 g by mouth daily as needed (constipation). [provider] Unknown Active Self, Pharmacy Records  pregabalin  (LYRICA ) 150 MG capsule [id number] No Take 1 capsule (150 mg total) by mouth in the morning, at noon, and at bedtime. [name], DO 05/07/2024 Active Self, Pharmacy Records  tamsulosin  (FLOMAX ) 0.4 MG CAPS capsule [id number] No TAKE 1 CAPSULE BY MOUTH DAILY  Patient taking differently: Take 0.4 mg by mouth at bedtime as needed.   [name], DO Not Taking Active Self, Pharmacy Records           Med Note Silvestre Drum, Alvino Joseph May 08, 2024  7:06 PM) patient states it helps him not use the bathroom  vitamin B-12 (CYANOCOBALAMIN ) 1000 MCG tablet [id number] No Take 1 tablet (1,000 mcg total) by mouth daily.  Patient taking differently: Take 1,000 mcg by mouth in the morning.   Vernell Goldsmith, MD 05/08/2024 Active Self, Pharmacy Records  Vitamin D , Ergocalciferol , (DRISDOL ) 1.25 MG (50000 UNIT) CAPS capsule [id number] No Take 1 capsule (50,000 Units total) by mouth every 7 (seven) days.  Patient taking differently: Take 50,000 Units by mouth every 7 (seven) days. On Wednesdays   Vernell Goldsmith, MD 04/06/2024 Active Self, Pharmacy Records           Med Note (CRUTHIS, Wash Hack Apr 10, 2024 10:04 AM) No fill hx found. Pt is adamant he is taking this medication once a week.             Home Care and Equipment/Supplies: Were Home Health Services Ordered?: NA Any new equipment or medical supplies ordered?: NA  Functional  Questionnaire: Do you need assistance with bathing/showering or dressing?: No Do you need assistance with meal preparation?: Yes (His family assist) Do you need assistance with eating?: No Do you have difficulty maintaining continence: No Do you need assistance with getting out of bed/getting out of a chair/moving?: No Do you have difficulty managing or taking your medications?: No  Follow up appointments reviewed: PCP Follow-up appointment confirmed?: Yes Date of PCP follow-up appointment?: 05/11/24 Follow-up Provider: Dr. Jari Merles Specialist [facility] Follow-up appointment confirmed?: Yes Date of Specialist follow-up appointment?: 05/31/24 Follow-Up Specialty Provider:: Dr. May, GI Do you need transportation to your follow-up appointment?: No Do you understand care options if your condition(s) worsen?: Yes-patient verbalized understanding  SDOH Interventions Today    Flowsheet Row Most Recent Value  SDOH Interventions   Food Insecurity Interventions Intervention Not Indicated  Housing Interventions Intervention Not Indicated  Transportation Interventions Intervention Not Indicated  Utilities Interventions Intervention Not Indicated       Goals Addressed             This Visit's Progress    VBCI Transitions of Care (TOC) Care Plan       Problems:  Recent Hospitalization for treatment  of Pancreatitis  Goal:  Over the next 30 days, the patient will not experience hospital readmission  Interventions:   Evaluation of current treatment plan related to Pancreatitis, self-management and patient's adherence to plan as established by provider. Discussed plans with patient for ongoing care management follow up and provided patient with direct contact information for care management team Reviewed medications with patient and discussed pain control and constipation Reviewed scheduled/upcoming provider appointments including PCP on June 11th and GI specialist 05/31/24 Discussed plans  with patient for ongoing care management follow up and provided patient with direct contact information for care management team Advised patient to discuss Increase pain and nausea with provider Screening for signs and symptoms of depression related to chronic disease state   Patient Self Care Activities:  Attend all scheduled provider appointments Call pharmacy for medication refills 3-7 days in advance of running out of medications Call provider office for new concerns or questions  Notify RN Care Manager of [facility] call rescheduling needs Participate in Transition of Care Program/Attend [facility] scheduled calls Perform all self care activities independently  Take medications as prescribed    Plan:  Telephone follow up appointment with care management team member scheduled for:  Thursday June 12th at 12pm      [facility], BSN, RN [place]  VBCI - [employer] [facility] RN Care Manager [phone]

## 2024-05-09 NOTE — Telephone Encounter (Signed)
 Dr. Karene Oto, the recommended CT scan has not been scheduled. Patient went back to ED yesterday and they completed another CT scan. Can we discontinue future CT order at this time or when would you like to repeat?

## 2024-05-09 NOTE — Patient Instructions (Signed)
 Visit Information  Thank you for taking time to visit with me today. Please don't hesitate to contact me if I can be of assistance to you before our next scheduled telephone appointment.  Our next appointment is by telephone on Thursday June 12 at 12:00pm  Following is a copy of your care plan:   Goals Addressed             This Visit's Progress    VBCI Transitions of Care (TOC) Care Plan       Problems:  Recent Hospitalization for treatment of Pancreatitis  Goal:  Over the next 30 days, the patient will not experience hospital readmission  Interventions:   Evaluation of current treatment plan related to Pancreatitis, self-management and patient's adherence to plan as established by provider. Discussed plans with patient for ongoing care management follow up and provided patient with direct contact information for care management team Reviewed medications with patient and discussed pain control and constipation Reviewed scheduled/upcoming provider appointments including PCP on June 11th and GI specialist 05/31/24 Discussed plans with patient for ongoing care management follow up and provided patient with direct contact information for care management team Advised patient to discuss Increase pain and nausea with provider Screening for signs and symptoms of depression related to chronic disease state   Patient Self Care Activities:  Attend all scheduled provider appointments Call pharmacy for medication refills 3-7 days in advance of running out of medications Call provider office for new concerns or questions  Notify RN Care Manager of [facility] call rescheduling needs Participate in Transition of Care Program/Attend [facility] scheduled calls Perform all self care activities independently  Take medications as prescribed    Plan:  Telephone follow up appointment with care management team member scheduled for:  Thursday June 12th at 12pm        Patient verbalizes understanding of  instructions and care plan provided today and agrees to view in MyChart. Active MyChart status and patient understanding of how to access instructions and care plan via MyChart confirmed with patient.     The patient has been provided with contact information for the care management team and has been advised to call with any health related questions or concerns.   Please call the care guide team at [phone] if you need to cancel or reschedule your appointment.   Please call the Suicide and Crisis Lifeline: 988 call the [place]  National Suicide Prevention Lifeline: (830) 745-7739 or TTY: 562-259-2628 TTY 714-637-0499) to talk to a trained counselor if you are experiencing a Mental Health or Behavioral Health Crisis or need someone to talk to.  Gareld June, BSN, RN [place]  VBCI - [employer] [facility] RN Care Manager [phone]

## 2024-05-09 NOTE — Telephone Encounter (Signed)
 Noted, CT order cancelled at this time.

## 2024-05-11 ENCOUNTER — Ambulatory Visit: Admitting: Student

## 2024-05-11 VITALS — BP 125/79 | HR 104 | Temp 98.1°F | Ht 68.0 in | Wt 151.2 lb

## 2024-05-11 DIAGNOSIS — J4489 Other specified chronic obstructive pulmonary disease: Secondary | ICD-10-CM

## 2024-05-11 DIAGNOSIS — K5669 Other partial intestinal obstruction: Secondary | ICD-10-CM

## 2024-05-11 DIAGNOSIS — F17210 Nicotine dependence, cigarettes, uncomplicated: Secondary | ICD-10-CM

## 2024-05-11 DIAGNOSIS — Z79891 Long term (current) use of opiate analgesic: Secondary | ICD-10-CM | POA: Diagnosis not present

## 2024-05-11 DIAGNOSIS — K861 Other chronic pancreatitis: Secondary | ICD-10-CM

## 2024-05-11 DIAGNOSIS — K8689 Other specified diseases of pancreas: Secondary | ICD-10-CM | POA: Diagnosis not present

## 2024-05-11 DIAGNOSIS — Z720 Tobacco use: Secondary | ICD-10-CM

## 2024-05-11 MED ORDER — SENNA 8.6 MG PO TABS: 1.0000 | ORAL_TABLET | Freq: Every day | ORAL | 3 refills | Status: AC

## 2024-05-11 MED ORDER — NICOTINE 14 MG/24HR TD PT24: 14.0000 mg | MEDICATED_PATCH | Freq: Every day | TRANSDERMAL | 11 refills | Status: DC
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
## 2024-05-11 NOTE — Patient Instructions (Addendum)
 Thank you, [name] for allowing us  to provide your care today. Today we discussed   -Start Miralax  and Senna daily for constipation. -Follow up with your GI doctor on 7/1 -Call your pain management doctor tomorrow about your monthly oxycodone    -Breathing is better and no wheezing, continue your inhalers  -Work on cutting back on smoking, nicotine  patch daily -Keep eye out on weight, improve on your appetite and take protein shake/supplements if needed, continue with your [name]    I have ordered the following medication/changed the following medications:  Start the following medications: Meds ordered this encounter  Medications   senna (SENOKOT) 8.6 MG TABS tablet    Sig: Take 1 tablet (8.6 mg total) by mouth daily.    Dispense:  90 tablet    Refill:  3   nicotine  (NICODERM CQ  - DOSED IN MG/24 HOURS) 14 mg/24hr patch    Sig: Place 1 patch (14 mg total) onto the skin daily.    Dispense:  30 patch    Refill:  11     Follow up: 2-3 months   Should you have any questions or concerns please call the internal medicine clinic at [phone].    Desaray Marschner, D.O. [facility]

## 2024-05-12 ENCOUNTER — Encounter: Payer: Self-pay | Admitting: Student

## 2024-05-12 ENCOUNTER — Other Ambulatory Visit: Payer: Self-pay

## 2024-05-12 ENCOUNTER — Telehealth: Payer: Self-pay | Admitting: *Deleted

## 2024-05-12 DIAGNOSIS — Z79891 Long term (current) use of opiate analgesic: Secondary | ICD-10-CM | POA: Insufficient documentation

## 2024-05-12 DIAGNOSIS — K8689 Other specified diseases of pancreas: Secondary | ICD-10-CM | POA: Insufficient documentation

## 2024-05-12 NOTE — Assessment & Plan Note (Signed)
 Recent hospitalization for COPD exacerbation 2/2 COVID and PNA. Treated with abx and steroids and completed Augmentin  on 6/9. Stable on RA, no wheezing on exam. Reports taking his Breo daily.   Plan -Continue Breo and has PRN albuterol   -Follows with pulmonology

## 2024-05-12 NOTE — Assessment & Plan Note (Addendum)
 Endorses smoking ~1/2 ppd for past ~40 years. Willing to cut back and start NRT. Will send nicotine  patches, declined gum today. Of note, follows with pulmonology and rad oncology for RUL lung nodule. Needs to reschedule f/u appt.  Time spent for smoking/tobacco cessation counseling: 5 minutes
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
## 2024-05-12 NOTE — Patient Instructions (Signed)
 Visit Information  Thank you for taking time to visit with me today. Please don't hesitate to contact me if I can be of assistance to you before our next scheduled telephone appointment.  Our next appointment is by telephone on Thursday June 19th at 1:30pm  Following is a copy of your care plan:   Goals Addressed             This Visit's Progress    VBCI Transitions of Care (TOC) Care Plan       Problems: (Reviewed 05/12/24) Recent Hospitalization for treatment of Pancreatitis  Goal: (Reviewed 05/12/24) Over the next 30 days, the patient will not experience hospital readmission  Interventions: (Reviewed 05/12/24)  Evaluation of current treatment plan related to Pancreatitis, self-management and patient's adherence to plan as established by provider. Discussed plans with patient for ongoing care management follow up and provided patient with direct contact information for care management team Reviewed medications with patient and discussed pain control and constipation Reviewed scheduled/upcoming provider appointments including PCP on June 11th and GI specialist 05/31/24 Discussed plans with patient for ongoing care management follow up and provided patient with direct contact information for care management team Advised patient to discuss Increase pain and nausea with provider Screening for signs and symptoms of depression related to chronic disease state  Evaluate for constipation. Educate patient to have a BM at least every other Letts and do not go more than three days without moving bowels.   Patient Self Care Activities:  Attend all scheduled provider appointments Call pharmacy for medication refills 3-7 days in advance of running out of medications Call provider office for new concerns or questions  Notify RN Care Manager of [facility] call rescheduling needs Participate in Transition of Care Program/Attend [facility] scheduled calls Perform all self care activities independently  Take  medications as prescribed    Plan:  Telephone follow up appointment with care management team member scheduled for:  Thursday June 19th at 1:30pm        Patient verbalizes understanding of instructions and care plan provided today and agrees to view in MyChart. Active MyChart status and patient understanding of how to access instructions and care plan via MyChart confirmed with patient.     The patient has been provided with contact information for the care management team and has been advised to call with any health related questions or concerns.   Please call the care guide team at [phone] if you need to cancel or reschedule your appointment.   Please call the Suicide and Crisis Lifeline: 988 call the [place]  National Suicide Prevention Lifeline: 717-877-6255 or TTY: 615-816-1374 TTY 539-384-5082) to talk to a trained counselor if you are experiencing a Mental Health or Behavioral Health Crisis or need someone to talk to.  Gareld June, BSN, RN [place]  VBCI - [employer] [facility] RN Care Manager [phone]

## 2024-05-12 NOTE — Assessment & Plan Note (Signed)
 Prescribed oxycodone  30 mg BID PRN per PDMP review. Follows with [facility]. PDMP reviewed and last fill 03/25/24. Patient to call their office regarding refills, no refills here. Encourage daily bowel regimen.

## 2024-05-12 NOTE — Assessment & Plan Note (Signed)
 Hospitalization complicated with partial large bowel obstruction. Was on NGT for decompression. Improved and tolerated po intake. Stable today, reports BM on Sunday but passing flatus today. Tolerating po intake without n/v. Taking Miralax  which helps. Will add on senna for daily bowel regimen since chronic opioid use.

## 2024-05-12 NOTE — Assessment & Plan Note (Signed)
 Hospitalized for acute on chronic pancreatitis. Also noted fluid collection peripancreatic. Surgery and GI involved while in hospital. No drains placed and plan for outpatient f/u. Repeat CT abd and GI f/u on 7/1. Still has some abdominal pain but tolerating po intake and having BM. No longer drinking but still smoking.   Plan -GI f/u on 7/10 -Smoking cessation counseling

## 2024-05-12 NOTE — Assessment & Plan Note (Signed)
 Hx of pancreatic insufficiency. Resumed home [name]  during hospitalization which he is continuing to take at home.

## 2024-05-12 NOTE — Telephone Encounter (Signed)
 Received fax from [facility] regarding oxygen, however, according to epic, we have never ordered oxygen for this patient and he was not placed on oxygen during last hospitalization.

## 2024-05-19 ENCOUNTER — Other Ambulatory Visit: Payer: Self-pay

## 2024-05-19 NOTE — Progress Notes (Signed)
 Internal Medicine Clinic Attending  Case discussed with the resident at the time of the visit.  We reviewed the resident's history and exam and pertinent patient test results.  I agree with the assessment, diagnosis, and plan of care documented in the resident's note.

## 2024-05-25 ENCOUNTER — Ambulatory Visit: Admitting: *Deleted

## 2024-05-25 VITALS — Ht 68.0 in | Wt 151.0 lb

## 2024-05-25 DIAGNOSIS — R1013 Epigastric pain: Secondary | ICD-10-CM | POA: Diagnosis not present

## 2024-05-25 DIAGNOSIS — M545 Low back pain, unspecified: Secondary | ICD-10-CM | POA: Diagnosis not present

## 2024-05-25 DIAGNOSIS — Z0000 Encounter for general adult medical examination without abnormal findings: Secondary | ICD-10-CM

## 2024-05-25 DIAGNOSIS — G8929 Other chronic pain: Secondary | ICD-10-CM | POA: Diagnosis not present

## 2024-05-25 DIAGNOSIS — Z79899 Other long term (current) drug therapy: Secondary | ICD-10-CM | POA: Diagnosis not present
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
## 2024-05-25 NOTE — Patient Instructions (Signed)
 Wesley [name] , Thank you for taking time to come for your Medicare Wellness Visit. I appreciate your ongoing commitment to your health goals. Please review the following plan we discussed and let me know if I can assist you in the future.   Screening recommendations/referrals: Colonoscopy: up to date Recommended yearly ophthalmology/optometry visit for glaucoma screening and checkup Recommended yearly dental visit for hygiene and checkup  Vaccinations: Influenza vaccine: up to date Pneumococcal vaccine: up to date Tdap vaccine: up to date Shingles vaccine: Education provided     Preventive Care 40-64 Years, Male Preventive care refers to lifestyle choices and visits with your health care provider that can promote health and wellness. What does preventive care include? A yearly physical exam. This is also called an annual well check. Dental exams once or twice a year. Routine eye exams. Ask your health care provider how often you should have your eyes checked. Personal lifestyle choices, including: Daily care of your teeth and gums. Regular physical activity. Eating a healthy diet. Avoiding tobacco and drug use. Limiting alcohol use. Practicing safe sex. Taking low-dose aspirin every [place] starting at age 40. What happens during an annual well check? The services and screenings done by your health care provider during your annual well check will depend on your age, overall health, lifestyle risk factors, and family history of disease. Counseling  Your health care provider may ask you questions about your: Alcohol use. Tobacco use. Drug use. Emotional well-being. Home and relationship well-being. Sexual activity. Eating habits. Work and work Astron[employer]. Screening  You may have the following tests or measurements: Height, weight, and BMI. Blood pressure. Lipid and cholesterol levels. These may be checked every 5 years, or more frequently if you are over 46 years old. Skin  check. Lung cancer screening. You may have this screening every year starting at age 84 if you have a 30-pack-year history of smoking and currently smoke or have quit within the past 15 years. Fecal occult blood test (FOBT) of the stool. You may have this test every year starting at age 36. Flexible sigmoidoscopy or colonoscopy. You may have a sigmoidoscopy every 5 years or a colonoscopy every 10 years starting at age 59. Prostate cancer screening. Recommendations will vary depending on your family history and other risks. Hepatitis C blood test. Hepatitis B blood test. Sexually transmitted disease (STD) testing. Diabetes screening. This is done by checking your blood sugar (glucose) after you have not eaten for a while (fasting). You may have this done every 1-3 years. Discuss your test results, treatment options, and if necessary, the need for more tests with your health care provider. Vaccines  Your health care provider may recommend certain vaccines, such as: Influenza vaccine. This is recommended every year. Tetanus, diphtheria, and acellular pertussis (Tdap, Td) vaccine. You may need a Td booster every 10 years. Zoster vaccine. You may need this after age 20. Pneumococcal 13-valent conjugate (PCV13) vaccine. You may need this if you have certain conditions and have not been vaccinated. Pneumococcal polysaccharide (PPSV23) vaccine. You may need one or two doses if you smoke cigarettes or if you have certain conditions. Talk to your health care provider about which screenings and vaccines you need and how often you need them. This information is not intended to replace advice given to you by your health care provider. Make sure you discuss any questions you have with your health care provider. Document Released: 12/14/2015 Document Revised: 08/06/2016 Document Reviewed: 09/18/2015 Elsevier Interactive Patient Education  2017  [employer].  Fall Prevention in the Home Falls can cause  injuries. They can happen to people of all ages. There are many things you can do to make your home safe and to help prevent falls. What can I do on the outside of my home? Regularly fix the edges of walkways and driveways and fix any cracks. Remove anything that might make you trip as you walk through a door, such as a raised step or threshold. Trim any bushes or trees on the path to your home. Use bright outdoor lighting. Clear any walking paths of anything that might make someone trip, such as rocks or tools. Regularly check to see if handrails are loose or broken. Make sure that both sides of any steps have handrails. Any raised decks and porches should have guardrails on the edges. Have any leaves, snow, or ice cleared regularly. Use sand or salt on walking paths during winter. Clean up any spills in your garage right away. This includes oil or grease spills. What can I do in the bathroom? Use night lights. Install grab bars by the toilet and in the tub and shower. Do not use towel bars as grab bars. Use non-skid mats or decals in the tub or shower. If you need to sit down in the shower, use a plastic, non-slip stool. Keep the floor dry. Clean up any water that spills on the floor as soon as it happens. Remove soap buildup in the tub or shower regularly. Attach bath mats securely with double-sided non-slip rug tape. Do not have throw rugs and other things on the floor that can make you trip. What can I do in the bedroom? Use night lights. Make sure that you have a light by your bed that is easy to reach. Do not use any sheets or blankets that are too big for your bed. They should not hang down onto the floor. Have a firm chair that has side arms. You can use this for support while you get dressed. Do not have throw rugs and other things on the floor that can make you trip. What can I do in the kitchen? Clean up any spills right away. Avoid walking on wet floors. Keep items that you  use a lot in easy-to-reach places. If you need to reach something above you, use a strong step stool that has a grab bar. Keep electrical cords out of the way. Do not use floor polish or wax that makes floors slippery. If you must use wax, use non-skid floor wax. Do not have throw rugs and other things on the floor that can make you trip. What can I do with my stairs? Do not leave any items on the stairs. Make sure that there are handrails on both sides of the stairs and use them. Fix handrails that are broken or loose. Make sure that handrails are as long as the stairways. Check any carpeting to make sure that it is firmly attached to the stairs. Fix any carpet that is loose or worn. Avoid having throw rugs at the top or bottom of the stairs. If you do have throw rugs, attach them to the floor with carpet tape. Make sure that you have a light switch at the top of the stairs and the bottom of the stairs. If you do not have them, ask someone to add them for you. What else can I do to help prevent falls? Wear shoes that: Do not have high heels. Have rubber bottoms. Are  comfortable and fit you well. Are closed at the toe. Do not wear sandals. If you use a stepladder: Make sure that it is fully opened. Do not climb a closed stepladder. Make sure that both sides of the stepladder are locked into place. Ask someone to hold it for you, if possible. Clearly mark and make sure that you can see: Any grab bars or handrails. First and last steps. Where the edge of each step is. Use tools that help you move around (mobility aids) if they are needed. These include: Canes. Walkers. Scooters. Crutches. Turn on the lights when you go into a dark area. Replace any light bulbs as soon as they burn out. Set up your furniture so you have a clear path. Avoid moving your furniture around. If any of your floors are uneven, fix them. If there are any pets around you, be aware of where they are. Review your  medicines with your doctor. Some medicines can make you feel dizzy. This can increase your chance of falling. Ask your doctor what other things that you can do to help prevent falls. This information is not intended to replace advice given to you by your health care provider. Make sure you discuss any questions you have with your health care provider. Document Released: 09/13/2009 Document Revised: 04/24/2016 Document Reviewed: 12/22/2014 Elsevier Interactive Patient Education  2017 [employer].

## 2024-05-26 ENCOUNTER — Other Ambulatory Visit: Payer: Self-pay | Admitting: Student

## 2024-05-26 DIAGNOSIS — M792 Neuralgia and neuritis, unspecified: Secondary | ICD-10-CM

## 2024-05-26 DIAGNOSIS — M48062 Spinal stenosis, lumbar region with neurogenic claudication: Secondary | ICD-10-CM

## 2024-05-26 NOTE — Progress Notes (Signed)
 Internal Medicine Attending:  I reviewed the AWV findings of the medical professional who conducted the visit. I was present in the office suite and immediately available to provide assistance and direction throughout the time the service was provided.

## 2024-05-27 ENCOUNTER — Other Ambulatory Visit (HOSPITAL_COMMUNITY): Payer: Self-pay

## 2024-05-27 ENCOUNTER — Other Ambulatory Visit: Payer: Self-pay | Admitting: Student

## 2024-05-30 NOTE — Progress Notes (Signed)
 Internal Medicine Attending:  I reviewed the AWV findings of the medical professional who conducted the visit. I was present in the office suite and immediately available to provide assistance and direction throughout the time the service was provided.

## 2024-05-31 ENCOUNTER — Encounter: Payer: Self-pay | Admitting: Gastroenterology

## 2024-05-31 ENCOUNTER — Ambulatory Visit: Admitting: Gastroenterology

## 2024-05-31 ENCOUNTER — Other Ambulatory Visit (INDEPENDENT_AMBULATORY_CARE_PROVIDER_SITE_OTHER)

## 2024-05-31 ENCOUNTER — Other Ambulatory Visit (HOSPITAL_COMMUNITY): Payer: Self-pay

## 2024-05-31 VITALS — BP 110/80 | HR 100 | Ht 68.0 in | Wt 147.4 lb

## 2024-05-31 DIAGNOSIS — K863 Pseudocyst of pancreas: Secondary | ICD-10-CM

## 2024-05-31 DIAGNOSIS — T402X5A Adverse effect of other opioids, initial encounter: Secondary | ICD-10-CM | POA: Diagnosis not present

## 2024-05-31 DIAGNOSIS — R110 Nausea: Secondary | ICD-10-CM

## 2024-05-31 DIAGNOSIS — K8689 Other specified diseases of pancreas: Secondary | ICD-10-CM

## 2024-05-31 DIAGNOSIS — K5909 Other constipation: Secondary | ICD-10-CM

## 2024-05-31 DIAGNOSIS — R188 Other ascites: Secondary | ICD-10-CM | POA: Diagnosis not present

## 2024-05-31 DIAGNOSIS — K861 Other chronic pancreatitis: Secondary | ICD-10-CM | POA: Diagnosis not present

## 2024-05-31 DIAGNOSIS — K859 Acute pancreatitis without necrosis or infection, unspecified: Secondary | ICD-10-CM | POA: Diagnosis not present

## 2024-05-31 DIAGNOSIS — K5903 Drug induced constipation: Secondary | ICD-10-CM | POA: Diagnosis not present

## 2024-05-31 LAB — CBC WITH DIFFERENTIAL/PLATELET
Basophils Absolute: 0.1 10*3/uL (ref 0.0–0.1)
Basophils Relative: 1.5 % (ref 0.0–3.0)
Eosinophils Absolute: 0.6 10*3/uL (ref 0.0–0.7)
Eosinophils Relative: 5.6 % — ABNORMAL HIGH (ref 0.0–5.0)
HCT: 34.6 % — ABNORMAL LOW (ref 39.0–52.0)
Hemoglobin: 11.6 g/dL — ABNORMAL LOW (ref 13.0–17.0)
Lymphocytes Relative: 28.3 % (ref 12.0–46.0)
Lymphs Abs: 2.8 10*3/uL (ref 0.7–4.0)
MCHC: 33.6 g/dL (ref 30.0–36.0)
MCV: 85.3 fl (ref 78.0–100.0)
Monocytes Absolute: 0.8 10*3/uL (ref 0.1–1.0)
Monocytes Relative: 8.1 % (ref 3.0–12.0)
Neutro Abs: 5.6 10*3/uL (ref 1.4–7.7)
Neutrophils Relative %: 56.5 % (ref 43.0–77.0)
Platelets: 429 10*3/uL — ABNORMAL HIGH (ref 150.0–400.0)
RBC: 4.06 Mil/uL — ABNORMAL LOW (ref 4.22–5.81)
RDW: 18.5 % — ABNORMAL HIGH (ref 11.5–15.5)
WBC: 9.9 10*3/uL (ref 4.0–10.5)

## 2024-05-31 LAB — COMPREHENSIVE METABOLIC PANEL WITH GFR
ALT: 5 U/L (ref 0–53)
AST: 9 U/L (ref 0–37)
Albumin: 3.8 g/dL (ref 3.5–5.2)
Alkaline Phosphatase: 73 U/L (ref 39–117)
BUN: 9 mg/dL (ref 6–23)
CO2: 29 meq/L (ref 19–32)
Calcium: 9.4 mg/dL (ref 8.4–10.5)
Chloride: 99 meq/L (ref 96–112)
Creatinine, Ser: 0.8 mg/dL (ref 0.40–1.50)
GFR: 97.03 mL/min (ref >60.00)
Glucose, Bld: 91 mg/dL (ref 70–99)
Potassium: 3.2 meq/L — ABNORMAL LOW (ref 3.5–5.1)
Sodium: 137 meq/L (ref 135–145)
Total Bilirubin: 0.5 mg/dL (ref 0.2–1.2)
Total Protein: 7.9 g/dL (ref 6.0–8.3)

## 2024-05-31 LAB — LIPASE: Lipase: 201 U/L — ABNORMAL HIGH (ref 11.0–59.0)

## 2024-05-31 MED ORDER — PREGABALIN 150 MG PO CAPS
150.0000 mg | ORAL_CAPSULE | Freq: Three times a day (TID) | ORAL | 1 refills | Status: DC
  Filled 2024-05-31: qty 90, 30d supply, fill #0
  Filled 2024-07-11: qty 90, 30d supply, fill #1

## 2024-05-31 MED ORDER — FLUTICASONE FUROATE-VILANTEROL 200-25 MCG/ACT IN AEPB
1.0000 | INHALATION_SPRAY | Freq: Every day | RESPIRATORY_TRACT | 3 refills | Status: DC
  Filled 2024-05-31: qty 60, 30d supply, fill #0

## 2024-05-31 NOTE — Patient Instructions (Signed)
 Samples provided for constipation Linzess 145mcg take 1 tablet 30-45 minutes before breakfast with full glass of water. Make have loose stools at beginning. If medication works well please let us  know and we will send prescription to pharmacy.    Your provider has requested that you go to the basement level for lab work before leaving today. Press B on the elevator. The lab is located at the first door on the left as you exit the elevator.  You have been scheduled for a CT scan of the abdomen and pelvis at [facility], 1st floor Radiology. You are scheduled on 06/13/24 at 3:30. You should arrive 2 hours (1:30pm) prior to your appointment time for registration.   Please follow the written instructions below on the [place] of your exam:    You may take any medications as prescribed with a small amount of water, if necessary. If you take any of the following medications: METFORMIN, GLUCOPHAGE, GLUCOVANCE, AVANDAMET, RIOMET, FORTAMET, ACTOPLUS MET, JANUMET, GLUMETZA or METAGLIP, you MAY be asked to HOLD this medication 48 hours AFTER the exam.   The purpose of you drinking the oral contrast is to aid in the visualization of your intestinal tract. The contrast solution may cause some diarrhea. Depending on your individual set of symptoms, you may also receive an intravenous injection of x-ray contrast/dye. Plan on being at [facility] for 45 minutes or longer, depending on the type of exam you are having performed.   If you have any questions regarding your exam or if you need to reschedule, you may call [facility] at [phone] between the hours of 8:00 am and 5:00 pm, Monday-Friday.
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
## 2024-06-01 ENCOUNTER — Ambulatory Visit: Payer: Self-pay | Admitting: Gastroenterology

## 2024-06-06 NOTE — Progress Notes (Signed)
 Agree with the assessment and plan as outlined by [facility], FNP-C.  Carlitos Bottino, DO, [facility]

## 2024-06-07 ENCOUNTER — Other Ambulatory Visit (HOSPITAL_COMMUNITY): Payer: Self-pay

## 2024-06-07 MED ORDER — LINACLOTIDE 145 MCG PO CAPS
145.0000 ug | ORAL_CAPSULE | Freq: Every day | ORAL | 2 refills | Status: AC
  Filled 2024-06-07: qty 30, 30d supply, fill #0

## 2024-06-07 NOTE — Addendum Note (Signed)
 Addended by: Archit Leger N on: 06/07/2024 09:10 AM   Modules accepted: Orders

## 2024-06-13 ENCOUNTER — Ambulatory Visit (HOSPITAL_COMMUNITY)
Admission: RE | Admit: 2024-06-13 | Discharge: 2024-06-13 | Disposition: A | Discharge status: Home / Self Care | Source: Ambulatory Visit | Attending: Gastroenterology | Admitting: Gastroenterology

## 2024-06-13 DIAGNOSIS — R110 Nausea: Secondary | ICD-10-CM | POA: Insufficient documentation

## 2024-06-13 DIAGNOSIS — K5730 Diverticulosis of large intestine without perforation or abscess without bleeding: Secondary | ICD-10-CM | POA: Diagnosis not present

## 2024-06-13 DIAGNOSIS — K8689 Other specified diseases of pancreas: Secondary | ICD-10-CM | POA: Insufficient documentation

## 2024-06-13 DIAGNOSIS — H524 Presbyopia: Secondary | ICD-10-CM | POA: Diagnosis not present

## 2024-06-13 DIAGNOSIS — K861 Other chronic pancreatitis: Secondary | ICD-10-CM | POA: Insufficient documentation

## 2024-06-13 DIAGNOSIS — K851 Biliary acute pancreatitis without necrosis or infection: Secondary | ICD-10-CM | POA: Diagnosis not present

## 2024-06-13 DIAGNOSIS — K7689 Other specified diseases of liver: Secondary | ICD-10-CM | POA: Diagnosis not present

## 2024-06-13 MED ORDER — IOHEXOL 300 MG/ML  SOLN
100.0000 mL | Freq: Once | INTRAMUSCULAR | Status: AC | PRN
  Administered 2024-06-13: 100 mL via INTRAVENOUS

## 2024-06-13 MED ORDER — SODIUM CHLORIDE (PF) 0.9 % IJ SOLN
INTRAMUSCULAR | Status: AC
  Filled 2024-06-13: qty 50

## 2024-06-21 ENCOUNTER — Telehealth: Payer: Self-pay | Admitting: Gastroenterology

## 2024-06-21 DIAGNOSIS — K861 Other chronic pancreatitis: Secondary | ICD-10-CM

## 2024-06-21 DIAGNOSIS — G8929 Other chronic pain: Secondary | ICD-10-CM

## 2024-06-21 NOTE — Telephone Encounter (Signed)
 Spoke with patient about CT results.  Also discussed Dr. Treasure recommendations  for celiac block for patient's chronic pain.  Patient would like to proceed.  Will go ahead and forward message to Ro.

## 2024-06-24 DIAGNOSIS — R1013 Epigastric pain: Secondary | ICD-10-CM | POA: Diagnosis not present

## 2024-06-24 DIAGNOSIS — M545 Low back pain, unspecified: Secondary | ICD-10-CM | POA: Diagnosis not present

## 2024-06-24 DIAGNOSIS — Z79899 Other long term (current) drug therapy: Secondary | ICD-10-CM | POA: Diagnosis not present

## 2024-06-24 DIAGNOSIS — Z79891 Long term (current) use of opiate analgesic: Secondary | ICD-10-CM | POA: Diagnosis not present

## 2024-07-04 NOTE — Telephone Encounter (Signed)
 Patient is calling to find out when he will be contact to schedule for a celiac block. Patient is requesting a call back. Please advise.

## 2024-07-05 NOTE — Telephone Encounter (Addendum)
 Returned call to patient. Had to leave message.    Stokes Rattigan, Can we schedule this patient for an upper EUS celiac block for chronic recurrent pancreatitis and chronic abdominal pain as diagnoses? This can be scheduled in the next 3 months as able. Thanks. GM

## 2024-07-06 NOTE — Telephone Encounter (Signed)
 Patient returning call. Requesting a call back. Please advise.   Thank you

## 2024-07-07 ENCOUNTER — Other Ambulatory Visit: Payer: Self-pay

## 2024-07-07 DIAGNOSIS — K861 Other chronic pancreatitis: Secondary | ICD-10-CM

## 2024-07-07 DIAGNOSIS — G8929 Other chronic pain: Secondary | ICD-10-CM

## 2024-07-07 NOTE — Addendum Note (Signed)
 Addended by: SUELLEN PEERS on: 07/07/2024 11:33 AM   Modules accepted: Orders

## 2024-07-07 NOTE — Telephone Encounter (Signed)
 Called and spoke with patient. Patient has been scheduled for 09/12/24 at  [facility] for EUS with Celiac Block. All information has been sent to patient via my chart and by mail.

## 2024-07-11 ENCOUNTER — Other Ambulatory Visit (HOSPITAL_COMMUNITY): Payer: Self-pay

## 2024-07-26 DIAGNOSIS — M545 Low back pain, unspecified: Secondary | ICD-10-CM | POA: Diagnosis not present

## 2024-07-26 DIAGNOSIS — G8929 Other chronic pain: Secondary | ICD-10-CM | POA: Diagnosis not present

## 2024-07-26 DIAGNOSIS — Z79891 Long term (current) use of opiate analgesic: Secondary | ICD-10-CM | POA: Diagnosis not present

## 2024-07-26 DIAGNOSIS — Z79899 Other long term (current) drug therapy: Secondary | ICD-10-CM | POA: Diagnosis not present

## 2024-07-26 DIAGNOSIS — R1013 Epigastric pain: Secondary | ICD-10-CM | POA: Diagnosis not present

## 2024-08-18 ENCOUNTER — Telehealth: Payer: Self-pay

## 2024-08-18 NOTE — Telephone Encounter (Signed)
 I did not speak to this patient I'm not sure who he spoke to.. we do not have a Dr.Levy in our office. I called and spoke to the patient he stated that he didn't know who called him as well and if he needs a appointment with his pcp he will give us  a call.

## 2024-08-18 NOTE — Telephone Encounter (Signed)
 Copied from CRM [id number]. Topic: General - Call Back - No Documentation >> Aug 18, 2024 11:04 AM Zane F wrote: Reason for CRM:    Patient is calling in to speak with Jada about an appointment with Dr. Beryl after missing a call from the office. Specialist called over to CAL for additional assistance. No documentation of the call was found by the specialist. Jada was not available at the time of the call, please call patient back.  Callback Number: [phone]

## 2024-08-19 ENCOUNTER — Telehealth: Payer: Self-pay | Admitting: *Deleted

## 2024-08-19 NOTE — Telephone Encounter (Addendum)
 Pt called again .SABRA Requesting to speak to [place]  again ... He stated that  he does have  the message that was left on his phone  and if there is a way to send it to the office I told him he can upload it to his mychart and we will receive that message .SABRA I them remain him that since his last OV he is due  to  come in for his 2-3 month f/u .SABRA To which he replied  he has to check his schedule  to make sure he doesn't have any  appts  upcoming and he will call back to set up his f/u  with pcp

## 2024-08-19 NOTE — Telephone Encounter (Signed)
 Hey [name], was there a message that was supposed to be attached? I don't see anything.

## 2024-08-22 ENCOUNTER — Other Ambulatory Visit: Payer: Self-pay | Admitting: Student

## 2024-08-22 ENCOUNTER — Other Ambulatory Visit (HOSPITAL_COMMUNITY): Payer: Self-pay

## 2024-08-22 ENCOUNTER — Other Ambulatory Visit: Payer: Self-pay

## 2024-08-22 DIAGNOSIS — M792 Neuralgia and neuritis, unspecified: Secondary | ICD-10-CM

## 2024-08-22 DIAGNOSIS — M48062 Spinal stenosis, lumbar region with neurogenic claudication: Secondary | ICD-10-CM

## 2024-08-22 MED ORDER — PREGABALIN 150 MG PO CAPS
150.0000 mg | ORAL_CAPSULE | Freq: Three times a day (TID) | ORAL | 1 refills | Status: DC
  Filled 2024-08-22: qty 90, 30d supply, fill #0
  Filled 2024-09-29: qty 90, 30d supply, fill #1

## 2024-08-24 ENCOUNTER — Telehealth: Payer: Self-pay

## 2024-08-24 ENCOUNTER — Telehealth: Payer: Self-pay | Admitting: *Deleted

## 2024-08-24 NOTE — Telephone Encounter (Signed)
 CMN received from [facility] requesting LOV notes.  The last time we saw the patient was 07/14/2023.  Notes faxed on 08/11/24, received confirmation that fax was sent successfully.  Nothing further needed.

## 2024-08-24 NOTE — Telephone Encounter (Signed)
 COPD : RN Telephone Call   Patient ID: Wesley [name], male    DOB: 1966/05/12  Age: 58 y.o. MRN: [id number]  Wesley [name] is a 58 y.o. who was called about COPD management.   Patient reports that they are taking the following medications for COPD:  PROVENTIL  ( neb solution ) /VENTOLIN  HFA / BREO ELLIPTA    Patient reports they  DO NOT have a prior pulmonologist who they follow:   TREATED UNDER OUR CARE   Please document smoking status, how many packs? Ready to quit ?  - Smoking Hx: He currently smokes 3 packs per week.   mMRC ([employer]) Dyspnea Scale   Ask the following questions and please check the box that describes the most.   Choose ONE>   []  Grade 0: I only get breathless with strenuous exercise   [x]  Grade 1: I get short of breath when hurry on level ground or walking up a slight hill   []  Grade 2: On level Ground, I walk slower than people of the same age because of breathlessness or have to stop for breath when walking my own pace   []  Grade 3: I Stop for breath after walking about 100 yards or after a few minutes on level ground   []  Grade 4: I am too breathless to leave the house, or I am breathless when dressing    CAT (COPD Assessment Test)   Please ask each of the following questions and select between 0-5 based on what describes the patient most accurately.  On a scale 0 to 5, do you never cough or cough all the time: 5 (I cough all the time)   0 (I have no phlegm in my chest at all) 0 (My chest does not feel tight at all)  5 (When I walk up a hill or one flight of stairs I am very breathless)  0 (I am not limited doing any activities at home) 0 (I am confident leaving my home despite my lung condition) 0 (I sleep soundly) On a scale 0 to 5, how would you describe your energy?: 0 (I have lots of energy)  Total: 10

## 2024-08-25 ENCOUNTER — Ambulatory Visit (INDEPENDENT_AMBULATORY_CARE_PROVIDER_SITE_OTHER): Admitting: Student

## 2024-08-25 ENCOUNTER — Telehealth: Payer: Self-pay | Admitting: *Deleted

## 2024-08-25 ENCOUNTER — Encounter: Payer: Self-pay | Admitting: Student

## 2024-08-25 VITALS — BP 120/79 | HR 97

## 2024-08-25 DIAGNOSIS — J4489 Other specified chronic obstructive pulmonary disease: Secondary | ICD-10-CM

## 2024-08-25 DIAGNOSIS — Z720 Tobacco use: Secondary | ICD-10-CM

## 2024-08-25 DIAGNOSIS — M79604 Pain in right leg: Secondary | ICD-10-CM

## 2024-08-25 DIAGNOSIS — M79605 Pain in left leg: Secondary | ICD-10-CM

## 2024-08-25 NOTE — Progress Notes (Signed)
 COPD Inhaler teaching Pt was asked to bring inhaler to visit....  Pt uses inhaler as directed and knows how to use correctly.  Reviewed instructions on how to use Dry powder inhaler (DPI): Breo Ellipta   Prepare the dose (slide lever) Listen for click Breathe out fully, away from the inhaler. Place mouthpiece in mouth with a tight seal. Breathe in quickly and deeply. Hold breath for up to 10 seconds. Rinse mouth with water (do not swallow) Pt demonstrated good technique with his own inhaler.  CMA also gave verbal instructions on albuterol  inhaler technique. Pt declined need for a spacer.
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
## 2024-08-25 NOTE — Telephone Encounter (Signed)
 Received CMN from [facility] for nebulizer.  Placed in CMN folder for signature.

## 2024-08-25 NOTE — Assessment & Plan Note (Signed)
 Discussed smoking cessation with patient today.  He is not ready to quit smoking at this time.  Plan: - Continue assess patient's readiness to quit smoking, when patient is ready, please give Chantix 

## 2024-08-25 NOTE — Assessment & Plan Note (Signed)
 Patient presents for COPD follow-up.  Patient is current medications include Breo Ellipta  and albuterol .  No acute exacerbation currently.  His mMRC is 1 and his CAT score is 10.  He is well-controlled at this time.  He does have eosinophil count of 600.  Already on ICS.  Patient has been taught inhaler technique today.  He is well-controlled.  Plan: - Continue Breo Ellipta  - Continue albuterol  - Patient instructed on how to use albuterol  and Breo Ellipta  inhalers today - Smoking cessation counseling provided

## 2024-08-25 NOTE — Patient Instructions (Addendum)
 Wesley [name],Thank you for allowing me to take part in your care today.  Here are your instructions.  1.  Regarding your COPD, you are doing well.  Continue taking your inhaler.  2.  Continue taking all of your other medications as prescribed.  3.  I am checking labs today, we will call you with results  4.  I am referring you to your orthopedic doctor to get the injections  5.  I am testing the blood flow in your legs, they will call you to schedule.  6.  When you are ready to stop smoking, please let me know I can send you medicine  7.  Please get capsaicin cream from over-the-counter for your legs.  PLEASE BRING YOUR MEDICATIONS TO EVERY APPOINTMENT  Thank you, Dr. Tobie  If you have any other questions please contact the internal medicine clinic at [phone] If it is after hours, please call the [place] hospital at [phone] and then ask the person who picks up for the resident on call.

## 2024-08-26 ENCOUNTER — Ambulatory Visit: Payer: Self-pay | Admitting: Student

## 2024-08-26 ENCOUNTER — Other Ambulatory Visit (HOSPITAL_COMMUNITY): Payer: Self-pay

## 2024-08-26 DIAGNOSIS — Z79891 Long term (current) use of opiate analgesic: Secondary | ICD-10-CM | POA: Diagnosis not present

## 2024-08-26 DIAGNOSIS — Z79899 Other long term (current) drug therapy: Secondary | ICD-10-CM | POA: Diagnosis not present

## 2024-08-26 DIAGNOSIS — G8929 Other chronic pain: Secondary | ICD-10-CM | POA: Diagnosis not present

## 2024-08-26 DIAGNOSIS — M545 Low back pain, unspecified: Secondary | ICD-10-CM | POA: Diagnosis not present

## 2024-08-26 DIAGNOSIS — R1013 Epigastric pain: Secondary | ICD-10-CM | POA: Diagnosis not present

## 2024-08-26 LAB — LIPID PANEL
Chol/HDL Ratio: 6.3 ratio — ABNORMAL HIGH (ref 0.0–5.0)
Cholesterol, Total: 150 mg/dL (ref 100–199)
HDL: 24 mg/dL — ABNORMAL LOW (ref >39)
LDL Chol Calc (NIH): 106 mg/dL — ABNORMAL HIGH (ref 0–99)
Triglycerides: 104 mg/dL (ref 0–149)
VLDL Cholesterol Cal: 20 mg/dL (ref 5–40)

## 2024-08-26 MED ORDER — ATORVASTATIN CALCIUM 10 MG PO TABS
10.0000 mg | ORAL_TABLET | Freq: Every day | ORAL | 3 refills | Status: AC
  Filled 2024-08-26: qty 90, 90d supply, fill #0
  Filled 2024-11-28: qty 90, 90d supply, fill #1

## 2024-08-26 NOTE — Progress Notes (Signed)
 Internal Medicine Clinic Attending  Case discussed with the resident at the time of the visit.  We reviewed the resident's history and exam and pertinent patient test results.  I agree with the assessment, diagnosis, and plan of care documented in the resident's note.

## 2024-09-01 ENCOUNTER — Telehealth: Payer: Self-pay | Admitting: *Deleted

## 2024-09-01 NOTE — Telephone Encounter (Signed)
 CALLED PATIENT TO INFORM OF CT FOR 09-09-24- ARRIVAL TIME- 2 PM @ [facility], NO RESTRICTIONS TO SCAN, PATIENT TO RECEIVE RESULTS FROM DR. KINARD ON 09-15-24 @ 11:15 AM, LVM FOR A RETURN CALL

## 2024-09-02 ENCOUNTER — Ambulatory Visit (HOSPITAL_COMMUNITY)
Admission: RE | Admit: 2024-09-02 | Discharge: 2024-09-02 | Disposition: A | Discharge status: Home / Self Care | Source: Ambulatory Visit | Attending: Internal Medicine | Admitting: Internal Medicine

## 2024-09-02 ENCOUNTER — Telehealth: Payer: Self-pay | Admitting: *Deleted

## 2024-09-02 DIAGNOSIS — M79604 Pain in right leg: Secondary | ICD-10-CM | POA: Diagnosis not present

## 2024-09-02 DIAGNOSIS — M79605 Pain in left leg: Secondary | ICD-10-CM | POA: Insufficient documentation

## 2024-09-02 LAB — VAS US ABI WITH/WO TBI
Left ABI: 1.27
Right ABI: 1.26

## 2024-09-02 NOTE — Telephone Encounter (Signed)
 Returned patient's phone call, spoke with patient

## 2024-09-05 ENCOUNTER — Telehealth: Payer: Self-pay | Admitting: Gastroenterology

## 2024-09-05 ENCOUNTER — Encounter (HOSPITAL_COMMUNITY): Payer: Self-pay | Admitting: Gastroenterology

## 2024-09-05 NOTE — Progress Notes (Signed)
 Attempted to obtain medical history for pre op call via telephone, unable to reach at this time. HIPAA compliant voicemail message left requesting return call to pre surgical testing department.

## 2024-09-05 NOTE — Telephone Encounter (Addendum)
 Procedure:Upper EUS Procedure date: 09/12/24 Procedure location: [facility] Arrival Time: 8:15 am Spoke with the patient Y/N:   No, I left a detailed message on [phone] on 09/05/24 @ 10:18 am for the patient to return call   Yes, 09/06/24 @ 10:51 am   Any prep concerns? No  Has the patient obtained the prep from the pharmacy ? No prep needed Do you have a care partner and transportation: Yes Any additional concerns? No

## 2024-09-09 ENCOUNTER — Ambulatory Visit (HOSPITAL_COMMUNITY)
Admission: RE | Admit: 2024-09-09 | Discharge: 2024-09-09 | Disposition: A | Discharge status: Home / Self Care | Source: Ambulatory Visit | Attending: Radiology | Admitting: Radiology

## 2024-09-09 ENCOUNTER — Encounter (HOSPITAL_COMMUNITY): Payer: Self-pay

## 2024-09-09 DIAGNOSIS — R9389 Abnormal findings on diagnostic imaging of other specified body structures: Secondary | ICD-10-CM | POA: Diagnosis not present

## 2024-09-09 DIAGNOSIS — R911 Solitary pulmonary nodule: Secondary | ICD-10-CM | POA: Diagnosis not present

## 2024-09-09 DIAGNOSIS — C3490 Malignant neoplasm of unspecified part of unspecified bronchus or lung: Secondary | ICD-10-CM | POA: Diagnosis not present

## 2024-09-09 DIAGNOSIS — J439 Emphysema, unspecified: Secondary | ICD-10-CM | POA: Diagnosis not present

## 2024-09-09 DIAGNOSIS — I700 Atherosclerosis of aorta: Secondary | ICD-10-CM | POA: Diagnosis not present

## 2024-09-09 MED ORDER — IOHEXOL 300 MG/ML  SOLN
75.0000 mL | Freq: Once | INTRAMUSCULAR | Status: AC | PRN
Start: 2024-09-09 — End: 2024-09-09
  Administered 2024-09-09: 75 mL via INTRAVENOUS

## 2024-09-09 MED ORDER — SODIUM CHLORIDE (PF) 0.9 % IJ SOLN
INTRAMUSCULAR | Status: AC
  Filled 2024-09-09: qty 50

## 2024-09-11 NOTE — Anesthesia Preprocedure Evaluation (Signed)
 Anesthesia Evaluation  Patient identified by MRN, date of birth, ID band Patient awake    Reviewed: Allergy & Precautions, NPO status , Patient's Chart, lab work & pertinent test results  Airway Mallampati: II  TM Distance: >3 FB Neck ROM: Full    Dental  (+) Edentulous Upper, Dental Advisory Given   Pulmonary COPD, Current Smoker and Patient abstained from smoking.   Pulmonary exam normal breath sounds clear to auscultation       Cardiovascular + Peripheral Vascular Disease  Normal cardiovascular exam Rhythm:Regular Rate:Normal     Neuro/Psych  Headaches PSYCHIATRIC DISORDERS         GI/Hepatic ,GERD  Medicated,,(+)     substance abuse  alcohol use, Hepatitis -, C  Endo/Other  negative endocrine ROS    Renal/GU negative Renal ROS     Musculoskeletal  (+) Arthritis ,  narcotic dependent  Abdominal   Peds  Hematology negative hematology ROS (+)   Anesthesia Other Findings   Reproductive/Obstetrics                              Anesthesia Physical Anesthesia Plan  ASA: 3  Anesthesia Plan: MAC   Post-op Pain Management: Minimal or no pain anticipated   Induction: Intravenous  PONV Risk Score and Plan: 1 and Dexamethasone , Ondansetron , TIVA and Propofol  infusion  Airway Management Planned:   Additional Equipment:   Intra-op Plan:   Post-operative Plan:   Informed Consent: I have reviewed the patients History and Physical, chart, labs and discussed the procedure including the risks, benefits and alternatives for the proposed anesthesia with the patient or authorized representative who has indicated his/her understanding and acceptance.     Dental advisory given  Plan Discussed with: CRNA  Anesthesia Plan Comments:         Anesthesia Quick Evaluation

## 2024-09-12 ENCOUNTER — Ambulatory Visit (HOSPITAL_COMMUNITY): Payer: Self-pay | Admitting: Certified Registered Nurse Anesthetist

## 2024-09-12 ENCOUNTER — Encounter (HOSPITAL_COMMUNITY): Payer: Self-pay | Admitting: Gastroenterology

## 2024-09-12 ENCOUNTER — Encounter (HOSPITAL_COMMUNITY)
Admission: RE | Disposition: A | Discharge status: Home / Self Care | Payer: Self-pay | Source: Home / Self Care | Attending: Gastroenterology

## 2024-09-12 ENCOUNTER — Ambulatory Visit (HOSPITAL_COMMUNITY)
Admission: RE | Admit: 2024-09-12 | Discharge: 2024-09-12 | Disposition: A | Discharge status: Home / Self Care | Attending: Gastroenterology | Admitting: Gastroenterology

## 2024-09-12 ENCOUNTER — Other Ambulatory Visit: Payer: Self-pay

## 2024-09-12 DIAGNOSIS — K861 Other chronic pancreatitis: Secondary | ICD-10-CM | POA: Insufficient documentation

## 2024-09-12 DIAGNOSIS — K219 Gastro-esophageal reflux disease without esophagitis: Secondary | ICD-10-CM | POA: Insufficient documentation

## 2024-09-12 DIAGNOSIS — K3189 Other diseases of stomach and duodenum: Secondary | ICD-10-CM | POA: Insufficient documentation

## 2024-09-12 DIAGNOSIS — I739 Peripheral vascular disease, unspecified: Secondary | ICD-10-CM | POA: Insufficient documentation

## 2024-09-12 DIAGNOSIS — F17210 Nicotine dependence, cigarettes, uncomplicated: Secondary | ICD-10-CM | POA: Diagnosis not present

## 2024-09-12 DIAGNOSIS — G8929 Other chronic pain: Secondary | ICD-10-CM

## 2024-09-12 DIAGNOSIS — K8689 Other specified diseases of pancreas: Secondary | ICD-10-CM | POA: Diagnosis not present

## 2024-09-12 DIAGNOSIS — K31A0 Gastric intestinal metaplasia, unspecified: Secondary | ICD-10-CM | POA: Insufficient documentation

## 2024-09-12 DIAGNOSIS — M1990 Unspecified osteoarthritis, unspecified site: Secondary | ICD-10-CM | POA: Diagnosis not present

## 2024-09-12 DIAGNOSIS — R590 Localized enlarged lymph nodes: Secondary | ICD-10-CM | POA: Diagnosis not present

## 2024-09-12 DIAGNOSIS — B182 Chronic viral hepatitis C: Secondary | ICD-10-CM | POA: Diagnosis not present

## 2024-09-12 DIAGNOSIS — K2289 Other specified disease of esophagus: Secondary | ICD-10-CM

## 2024-09-12 DIAGNOSIS — K2950 Unspecified chronic gastritis without bleeding: Secondary | ICD-10-CM | POA: Diagnosis not present

## 2024-09-12 DIAGNOSIS — R519 Headache, unspecified: Secondary | ICD-10-CM | POA: Diagnosis not present

## 2024-09-12 DIAGNOSIS — K31A11 Gastric intestinal metaplasia without dysplasia, involving the antrum: Secondary | ICD-10-CM

## 2024-09-12 DIAGNOSIS — J449 Chronic obstructive pulmonary disease, unspecified: Secondary | ICD-10-CM | POA: Diagnosis not present

## 2024-09-12 DIAGNOSIS — K838 Other specified diseases of biliary tract: Secondary | ICD-10-CM

## 2024-09-12 HISTORY — PX: ESOPHAGOGASTRODUODENOSCOPY: SHX5428

## 2024-09-12 HISTORY — PX: NEUROLYTIC CELIAC PLEXUS: SHX5435

## 2024-09-12 HISTORY — PX: EUS: SHX5427

## 2024-09-12 SURGERY — ULTRASOUND, UPPER GI TRACT, ENDOSCOPIC
Anesthesia: Monitor Anesthesia Care

## 2024-09-12 MED ORDER — BUPIVACAINE HCL 0.25 % IJ SOLN
20.0000 mL | Freq: Once | INTRAMUSCULAR | Status: AC
Start: 2024-09-12 — End: 2024-09-12
  Administered 2024-09-12: 20 mL
  Filled 2024-09-12: qty 20

## 2024-09-12 MED ORDER — CIPROFLOXACIN IN D5W 400 MG/200ML IV SOLN
INTRAVENOUS | Status: AC
  Filled 2024-09-12: qty 200

## 2024-09-12 MED ORDER — SODIUM CHLORIDE 0.9 % IV SOLN: INTRAVENOUS | Status: DC

## 2024-09-12 MED ORDER — PROPOFOL 500 MG/50ML IV EMUL
INTRAVENOUS | Status: DC | PRN
  Administered 2024-09-12: 150 ug/kg/min via INTRAVENOUS

## 2024-09-12 MED ORDER — TRIAMCINOLONE ACETONIDE 40 MG/ML IJ SUSP
INTRAMUSCULAR | Status: AC
  Filled 2024-09-12: qty 1

## 2024-09-12 MED ORDER — ONDANSETRON HCL 4 MG/2ML IJ SOLN
INTRAMUSCULAR | Status: DC | PRN
  Administered 2024-09-12: 4 mg via INTRAVENOUS

## 2024-09-12 MED ORDER — TRIAMCINOLONE ACETONIDE 40 MG/ML IJ SUSP
INTRAMUSCULAR | Status: DC | PRN
  Administered 2024-09-12: 80 mg via INTRAMUSCULAR

## 2024-09-12 MED ORDER — PROPOFOL 10 MG/ML IV BOLUS
INTRAVENOUS | Status: DC | PRN
  Administered 2024-09-12: 40 mg via INTRAVENOUS

## 2024-09-12 MED ORDER — DEXAMETHASONE SOD PHOSPHATE PF 10 MG/ML IJ SOLN
INTRAMUSCULAR | Status: DC | PRN
  Administered 2024-09-12: 5 mg via INTRAVENOUS

## 2024-09-12 MED ORDER — LIDOCAINE 2% (20 MG/ML) 5 ML SYRINGE
INTRAMUSCULAR | Status: DC | PRN
  Administered 2024-09-12: 50 mg via INTRAVENOUS

## 2024-09-12 MED ORDER — SODIUM CHLORIDE 0.9 % IV SOLN
INTRAVENOUS | Status: AC | PRN
  Administered 2024-09-12: 500 mL via INTRAVENOUS

## 2024-09-12 MED ORDER — PROPOFOL 500 MG/50ML IV EMUL
INTRAVENOUS | Status: AC
  Filled 2024-09-12: qty 50

## 2024-09-12 MED ORDER — FAT EMULSION PLANT BASED 20% (INTRALIPID)IV EMUL
INTRAVENOUS | Status: AC
  Filled 2024-09-12: qty 250

## 2024-09-12 MED ORDER — PHENYLEPHRINE 80 MCG/ML (10ML) SYRINGE FOR IV PUSH (FOR BLOOD PRESSURE SUPPORT)
PREFILLED_SYRINGE | INTRAVENOUS | Status: DC | PRN
  Administered 2024-09-12 (×2): 120 ug via INTRAVENOUS

## 2024-09-12 NOTE — Discharge Instructions (Signed)

## 2024-09-12 NOTE — Anesthesia Procedure Notes (Signed)
 Procedure Name: MAC Date/Time: 09/12/2024 9:15 AM  Performed by: Judythe Tanda Aran, CRNAPre-anesthesia Checklist: Patient identified, Emergency Drugs available, Suction available and Patient being monitored Patient Re-evaluated:Patient Re-evaluated prior to induction Oxygen Delivery Method: Nasal cannula

## 2024-09-12 NOTE — Transfer of Care (Signed)
 Immediate Anesthesia Transfer of Care Note  Patient: Wesley [name]  Procedure(s) Performed: ULTRASOUND, UPPER GI TRACT, ENDOSCOPIC DESTRUCTION, CELIAC PLEXUS, USING NEUROLYTIC AGENT  Patient Location: Endoscopy Unit  Anesthesia Type:MAC  Level of Consciousness: drowsy  Airway & Oxygen Therapy: Patient Spontanous Breathing and Patient connected to nasal cannula oxygen  Post-op Assessment: Report given to RN and Post -op Vital signs reviewed and stable  Post vital signs: Reviewed and stable  Last Vitals:  Vitals Value Taken Time  BP    Temp    Pulse 71 09/12/24 10:10  Resp 9 09/12/24 10:10  SpO2 99 % 09/12/24 10:10  Vitals shown include unfiled device data.  Last Pain:  Vitals:   09/12/24 0749  TempSrc: Temporal  PainSc: 8          Complications: No notable events documented.

## 2024-09-12 NOTE — Anesthesia Postprocedure Evaluation (Signed)
 Anesthesia Post Note  Patient: Wesley [name]  Procedure(s) Performed: ULTRASOUND, UPPER GI TRACT, ENDOSCOPIC DESTRUCTION, CELIAC PLEXUS, USING NEUROLYTIC AGENT     Patient location during evaluation: PACU Anesthesia Type: MAC Level of consciousness: awake and alert Pain management: pain level controlled Vital Signs Assessment: post-procedure vital signs reviewed and stable Respiratory status: spontaneous breathing Cardiovascular status: stable Anesthetic complications: no   No notable events documented.  Last Vitals:  Vitals:   09/12/24 1040 09/12/24 1045  BP: 107/73 114/77  Pulse: 71 73  Resp: 12 14  Temp:    SpO2: 94% 96%    Last Pain:  Vitals:   09/12/24 1045  TempSrc:   PainSc: 0-No pain                 Norleen Pope
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
## 2024-09-12 NOTE — Op Note (Addendum)
 [facility] Patient Name: Wesley [name] Procedure Date: 09/12/2024 MRN: [id number] Attending MD: Aloha Finner , MD, [id number] Date of Birth: 07/26/1966 CSN: [id number] Age: 58 Admit Type: Inpatient Procedure:                Upper EUS Indications:              Dilated pancreatic duct on CT scan, Chronic                            pancreatitis, Chronic recurrent pancreatitis,                            Celiac plexus block for pain secondary to chronic                            pancreatitis Providers:                Aloha Finner, MD, Robie Breed, RN,                            [facility] [place], Technician, Renay Darner Referring MD:              Medicines:                Monitored Anesthesia Care Complications:            No immediate complications. Estimated Blood Loss:     Estimated blood loss was minimal. Procedure:                Pre-Anesthesia Assessment:                           - Prior to the procedure, a History and Physical                            was performed, and patient medications and                            allergies were reviewed. The patient's tolerance of                            previous anesthesia was also reviewed. The risks                            and benefits of the procedure and the sedation                            options and risks were discussed with the patient.                            All questions were answered, and informed consent                            was obtained. Prior Anticoagulants: The patient has                            taken no  anticoagulant or antiplatelet agents. ASA                            Grade Assessment: III - A patient with severe                            systemic disease. After reviewing the risks and                            benefits, the patient was deemed in satisfactory                            condition to undergo the procedure.                           After  obtaining informed consent, the endoscope was                            passed under direct vision. Throughout the                            procedure, the patient's blood pressure, pulse, and                            oxygen saturations were monitored continuously. The                            [id number]-[id number] ([id number]) Olympus endoscope was introduced                            through the mouth, and advanced to the second part                            of duodenum. The [id number]-[id number] ([id number]) Olympus                            duodenoscope was introduced through the mouth, and                            advanced to the area of papilla. The [id number]                            ([id number]) Olympus endosonoscope was introduced                            through the mouth, and advanced to the duodenum for                            ultrasound examination from the stomach and                            duodenum. The upper EUS was accomplished without  difficulty. The patient tolerated the procedure. Scope In: Scope Out: Findings:      ENDOSCOPIC FINDING: :      No gross lesions were noted in the entire esophagus.      The Z-line was irregular and was found 43 cm from the incisors.      Patchy granular mucosa was found at the incisura and in the gastric       antrum.      No other gross lesions were noted in the entire examined stomach.       Biopsies were taken with a cold forceps for histology and Helicobacter       pylori testing.      No gross lesions were noted in the duodenal bulb, in the first portion       of the duodenum and in the second portion of the duodenum.      The major papilla was normal (hidden under a hood).      ENDOSONOGRAPHIC FINDING: :      Endosonographic imaging of the pancreas showed sonographic changes       indicative of moderate-severe chronic pancreatitis in the entire       pancreas. The parenchyma had hyperechoic foci with shadowing,  lobularity       without honeycombing and hyperechoic strands. The pancreatic duct had an       intraductal calculus in the neck, irregularity of contour, dilated       side-branches, and a hyperechoic duct margin. The pancreatic duct in the       head measured 2.3 mm, the pancreatic duct in the neck measured 3.8 mm       (this is just upstream of the noted pancreatic duct stone), the       pancreatic duct in the body measured 3.1 mm, pancreatic duct in the tail       measured 0.8 mm.      There was dilation in the common bile duct (3.2 -> 5.7 mm) and in the       common hepatic duct (9.8 mm). No evidence of choledocholithiasis.      Endosonographic imaging of the ampulla showed no intramural       (subepithelial) lesion.      Endosonographic imaging in the visualized portion of the liver showed no       mass.      Multiple enlarged lymph nodes were visualized in the left gastric region       (level 17), gastrohepatic ligament (level 18) and celiac region (level       20). The largest measured 15 mm by 8 mm in maximal cross-sectional       diameter. The nodes were triangular, isoechoic and had well defined       margins.      The celiac region was visualized. Celiac plexus block was performed to       try to decrease overall pain in setting of chronic pancreatitis. The       region of the celiac plexus and celiac ganglia was identified       endosonographically with Color Doppler imaging, using the take-off of       the celiac trunk from the anterior aspect of the aorta as the main       anatomical landmark. Color Doppler guidance was also used to confirm a       lack of significant vascular structures within the injection needle  path. Using a transgastric approach, a 20 gauge celiac plexus needle was       advanced to an injection site in the area of the celiac artery take-off.       Needle aspiration was performed prior to injection to exclude entry into       a blood vessel. A  total of 20 mL of 0.25% bupivacaine  and 2 mg of       triamcinolone (40 mg/mL) were injected for the celiac plexus block. The       needle was then withdrawn. Impression:               EGD impression:                           - No gross lesions in the entire esophagus. Z-line                            irregular, 43 cm from the incisors.                           - Granular gastric mucosa in the antrum/incisura.                            No other gross lesions in the entire stomach.                            Biopsied.                           - No gross lesions in the duodenal bulb, in the                            first portion of the duodenum and in the second                            portion of the duodenum.                           - Normal major papilla (hidden under a hood).                           EUS impression:                           - Endosonographic imaging of the pancreas showed                            sonographic changes consistent with moderate-severe                            chronic pancreatitis. There is evidence of an                            intraductal calculus in the pancreatic duct of the  neck causing some upstream dilation.                           - There was dilation in the common bile duct and in                            the common hepatic duct. No evidence of                            choledocholithiasis. Important to note that the                            amount of shadowing effect, especially in the HOP                            is such that the sensitivity of EUS for detecting                            lesions is decreased as a result of the significant                            chronic pancreatitis changes.                           - Multiple enlarged lymph nodes were visualized in                            the left gastric region (level 17), gastrohepatic                            ligament (level 18)  and celiac region (level 20).                            Tissue has not been obtained. However, the                            endosonographic appearance is suggestive of benign                            inflammatory changes.                           - Celiac plexus block performed as outlined above. Moderate Sedation:      Not Applicable - Patient had care per Anesthesia. Recommendation:           - The patient will be observed post-procedure,                            until all discharge criteria are met.                           - Discharge patient to home.                           -  Await path results.                           - Observe patient's clinical course.                           - Further discussions in the future as to whether                            pancreatic ERCP may be considered in an effort of                            trying to remove the intraductal calculus noted                            within the neck of the pancreas (pending patient                            remaining abstinent of all alcohol and no tobacco                            use). This can be discussed with patient's primary                            gastroenterologist in future.                           - If patient has significant effect from celiac                            block for weeks or months, we can consider repeat                            celiac block in future. Otherwise if short-lived or                            not helpful within the next 72 hours, then not                            clear that repeat EUS guided celiac block would be                            helpful, could consider interventional radiology                            attempt for a different access point.                           - Monitor for signs/symptoms of bleeding,                            perforation, and infection. If issues please call  our number to get further  assistance as needed.                           - The findings and recommendations were discussed                            with the patient.                           - The findings and recommendations were discussed                            with the patient's family. Procedure Code(s):        --- Professional ---                           [phone], Esophagogastroduodenoscopy, flexible,                            transoral; with transendoscopic ultrasound-guided                            transmural injection of diagnostic or therapeutic                            substance(s) (eg, anesthetic, neurolytic agent) or                            fiducial marker(s) (includes endoscopic ultrasound                            examination of the esophagus, stomach, and either                            the duodenum or a surgically altered stomach where                            the jejunum is examined distal to the anastomosis)                           43239, 59, Esophagogastroduodenoscopy, flexible,                            transoral; with biopsy, single or multiple Diagnosis Code(s):        --- Professional ---                           K86.1, Other chronic pancreatitis                           K22.89, Other specified disease of esophagus                           K31.89, Other diseases of stomach and duodenum  R59.0, Localized enlarged lymph nodes                           K86.89, Other specified diseases of pancreas                           K83.8, Other specified diseases of biliary tract CPT copyright 2022 American Medical Association. All rights reserved. The codes documented in this report are preliminary and upon coder review may  be revised to meet current compliance requirements. Aloha Finner, MD 09/12/2024 10:13:51 AM Number of Addenda: 0

## 2024-09-13 ENCOUNTER — Encounter (HOSPITAL_COMMUNITY): Payer: Self-pay | Admitting: Gastroenterology

## 2024-09-13 LAB — SURGICAL PATHOLOGY

## 2024-09-13 NOTE — Telephone Encounter (Signed)
 Patient has not been seen in office since 07/14/2023.  No upcomming appointments.  Patient will need OV for form to be completed.  Nothing further needed.

## 2024-09-14 ENCOUNTER — Ambulatory Visit: Payer: Self-pay | Admitting: Gastroenterology

## 2024-09-14 NOTE — Progress Notes (Signed)
 Radiation Oncology         [phone] ________________________________  Name: Wesley [name] MRN: [id number]  Date: 09/15/2024  DOB: 08/21/1966  Follow-Up Visit Note  CC: Jolaine Pac, DO  Shelah Lamar RAMAN, MD  No diagnosis found.  Diagnosis:  Enlarging hypermetabolic RUL pulmonary nodule suspicious for malignancy (negative nodal biopsies)   Narrative:  The patient returns today for routine follow-up and to review most recent imaging. He was last seen in office on 03/10/24 for a routine follow up. Patient continued to follow up with their specialists to manage their chronic conditions.                             Most recent CT chest performed on 09/09/24 shows no metastatic disease identified within the chest. Scan did redemonstrated focal asymmetric bulge and heterogeneity of the pancreatic tail and multiple wedge-shaped hypoattenuating areas within the spleen, concerning for splenic infarctions. There is also subcapsular collection with thickness up to 1.5 cm.     Of note: he underwent a EGD and EUS on 09/12/24 under the care of Dr. Wilhelmenia. Results showing sonographic changes consistent with moderate-severe chronic pancreatitis with evidence of an intraductal calculus in the pancreatic duct of the neck causing some upstream dilation. Multiple enlarged lymph nodes were visualized in the left gastric region (level 17), gastrohepatic ligament (level 18) and celiac region (level 20). Tissue has not been obtained. However, the endosonographic appearance is suggestive of benign inflammatory changes.  No other significant oncologic interval history since the patient was last seen.   Allergies:  has no known allergies.  Meds: Current Outpatient Medications  Medication Sig Dispense Refill   acetaminophen  (TYLENOL ) 500 MG tablet Take 1,500 mg by mouth every 6 (six) hours as needed for mild pain (pain score 1-3) or moderate pain (pain score 4-6).     albuterol  (PROVENTIL ) (2.5 MG/3ML)  0.083% nebulizer solution Take 3 mLs (2.5 mg total) by nebulization every 6 (six) hours as needed for wheezing or shortness of breath. 75 mL 5   albuterol  (VENTOLIN  HFA) 108 (90 Base) MCG/ACT inhaler Inhale 2 puffs into the lungs every 6 (six) hours as needed for wheezing or shortness of breath. 33.5 g 6   amitriptyline  ([name] ) 50 MG tablet Take 1 tablet (50 mg total) by mouth at bedtime. 90 tablet 3   atorvastatin  (LIPITOR) 10 MG tablet Take 1 tablet (10 mg total) by mouth daily. 90 tablet 3   bisacodyl  (DULCOLAX) 5 MG EC tablet Take 5 mg by mouth daily as needed for moderate constipation.     BREO ELLIPTA  200-25 MCG/ACT AEPB USE 1 INHALATION BY MOUTH DAILY 180 each 3   DULoxetine  (CYMBALTA ) 60 MG capsule TAKE 1 CAPSULE BY MOUTH DAILY 90 capsule 1   famotidine  (PEPCID ) 20 MG tablet Take 1 tablet (20 mg total) by mouth 2 (two) times daily. 200 tablet 2   fluticasone  furoate-vilanterol (BREO ELLIPTA ) 200-25 MCG/ACT AEPB Inhale 1 puff into the lungs daily. (Must have office visit for refills) 60 each 3   linaclotide  (LINZESS ) 145 MCG CAPS capsule Take 1 capsule (145 mcg total) by mouth daily before breakfast. 30 capsule 2   lipase/protease/amylase ([name] ) 36000 UNITS CPEP capsule Take 2 capsules (72,000 Units total) by mouth 3 (three) times daily with meals AND 1 capsule (36,000 Units total) with snacks. Max: 9 capsules per [place]. 810 capsule 3   Multiple Vitamin (MULTIVITAMIN WITH MINERALS) TABS tablet Take 1 tablet  by mouth in the morning.     nicotine  (NICODERM CQ  - DOSED IN MG/24 HOURS) 14 mg/24hr patch Place 1 patch (14 mg total) onto the skin daily. (Patient not taking: Reported on 09/12/2024) 30 patch 11   oxycodone  (ROXICODONE ) 30 MG immediate release tablet Take 15 mg by mouth 3 (three) times daily as needed for pain.     pantoprazole  (PROTONIX ) 40 MG tablet TAKE 1 TABLET BY MOUTH TWICE  DAILY BEFORE MEALS 200 tablet 2   polyethylene glycol (MIRALAX  / GLYCOLAX ) 17 g packet Take 17 g by mouth  daily as needed (constipation). (Patient not taking: Reported on 05/31/2024)     pregabalin  (LYRICA ) 150 MG capsule Take 1 capsule (150 mg total) by mouth in the morning, at noon, and at bedtime. 90 capsule 1   senna (SENOKOT) 8.6 MG TABS tablet Take 1 tablet (8.6 mg total) by mouth daily. 90 tablet 3   tamsulosin  (FLOMAX ) 0.4 MG CAPS capsule TAKE 1 CAPSULE BY MOUTH DAILY (Patient not taking: Reported on 05/31/2024) 100 capsule 2   vitamin B-12 (CYANOCOBALAMIN ) 1000 MCG tablet Take 1 tablet (1,000 mcg total) by mouth daily. (Patient taking differently: Take 1,000 mcg by mouth in the morning.) 60 tablet 4   Vitamin D , Ergocalciferol , (DRISDOL ) 1.25 MG (50000 UNIT) CAPS capsule Take 1 capsule (50,000 Units total) by mouth every 7 (seven) days. (Patient taking differently: Take 50,000 Units by mouth every 7 (seven) days. On Wednesdays) 14 capsule 3   No current facility-administered medications for this visit.    Physical Findings: The patient is in no acute distress. Patient is alert and oriented.  vitals were not taken for this visit. .  No significant changes. Lungs are clear to auscultation bilaterally. Heart has regular rate and rhythm. No palpable cervical, supraclavicular, or axillary adenopathy. Abdomen soft, non-tender, normal bowel sounds.   Lab Findings: Lab Results  Component Value Date   WBC 9.9 05/31/2024   HGB 11.6 (L) 05/31/2024   HCT 34.6 (L) 05/31/2024   MCV 85.3 05/31/2024   PLT 429.0 (H) 05/31/2024    Radiographic Findings: CT Chest W Contrast Result Date: 09/09/2024 CLINICAL DATA:  Non-small cell lung cancer (NSCLC), monitor. * Tracking Code: BO * EXAM: CT CHEST WITH CONTRAST TECHNIQUE: Multidetector CT imaging of the chest was performed during intravenous contrast administration. RADIATION DOSE REDUCTION: This exam was performed according to the departmental dose-optimization program which includes automated exposure control, adjustment of the mA and/or kV according to  patient size and/or use of iterative reconstruction technique. CONTRAST:  75mL OMNIPAQUE  IOHEXOL  300 MG/ML  SOLN COMPARISON:  CT scan chest from 04/30/2024. FINDINGS: Cardiovascular: Normal cardiac size. No pericardial effusion. No aortic aneurysm. There are coronary artery calcifications, in keeping with coronary artery disease. There are also mild peripheral atherosclerotic vascular calcifications of thoracic aorta and its major branches. Mediastinum/Nodes: Visualized thyroid  gland appears grossly unremarkable. No solid / cystic mediastinal masses. The esophagus is nondistended precluding optimal assessment. There are few mildly prominent mediastinal and hilar lymph nodes, which do not meet the size criteria for lymphadenopathy and appear grossly similar to the prior study, favoring benign etiology. No axillary lymphadenopathy by size criteria. Lungs/Pleura: The central tracheo-bronchial tree is patent. Moderate upper lobe predominant paraseptal emphysematous changes noted. There also dependent changes and patchy areas of atelectasis/scarring throughout bilateral lungs. No mass or consolidation. No pleural effusion or pneumothorax. No suspicious lung nodules. Upper Abdomen: There are several simple cysts in the liver with largest in the right hepatic lobe segment  8 measuring up to 1.5 x 1.6 cm. Redemonstration of multiple wedge-shaped hypoattenuating areas within the spleen, concerning for splenic infarctions. There is also subcapsular collection with thickness up to 1.5 cm, which may represent perisplenic hematoma/collection. Redemonstration of focal asymmetric bulge and heterogeneity of the pancreatic tail which is inseparable from the splenic hilum. There is also associated mild focal dilation of the main pancreatic duct. Findings are essentially similar to the prior CT scan abdomen and pelvis from 06/13/2024. Redemonstration of multiple mildly enlarged lymph nodes in the gastrohepatic pouch region. Remaining  visualized upper abdominal viscera within normal limits. Musculoskeletal: The visualized soft tissues of the chest wall are grossly unremarkable. No suspicious osseous lesions. There are mild multilevel degenerative changes in the visualized spine. IMPRESSION: 1. No metastatic disease identified within the chest. 2. Redemonstration of focal asymmetric bulge and heterogeneity of the pancreatic tail which is inseparable from the splenic hilum. There is also associated mild focal dilation of the main pancreatic duct. Findings are essentially similar to the prior CT scan abdomen and pelvis from 06/13/2024. Further evaluation with contrast-enhanced MRI abdomen as per pancreatic mass protocol is recommended, unless previously performed. 3. Redemonstration of multiple wedge-shaped hypoattenuating areas within the spleen, concerning for splenic infarctions. There is also subcapsular collection with thickness up to 1.5 cm, which may represent perisplenic hematoma/collection. 4. Multiple other nonacute observations, as described above. Aortic Atherosclerosis (ICD10-I70.0) and Emphysema (ICD10-J43.9). Electronically Signed   By: Ree Molt M.D.   On: 09/09/2024 15:48   VAS US  ABI WITH/WO TBI Result Date: 09/02/2024  LOWER EXTREMITY DOPPLER STUDY Patient Name:  Wesley [name]  Date of Exam:   09/02/2024 Medical Rec #: [id number]     Accession #:    [id number] Date of Birth: 04-Dec-1965     Patient Gender: M Patient Age:   11 years Exam Location:  [address] Procedure:      VAS US  ABI WITH/WO TBI Referring Phys: ERIK HOFFMAN --------------------------------------------------------------------------------  Indications: Claudication. Numbness and tingling High Risk Factors: Hyperlipidemia, current smoker. Other Factors: Patient complains of numbness and tingling in both feet. He has                pain in his thighs and calves with ambulation and at rest that                has getting worse over the last 6 months.   Performing Technologist: Alan Greenhouse RDMS, RVT, RDCS  Examination Guidelines: A complete evaluation includes at minimum, Doppler waveform signals and systolic blood pressure reading at the level of bilateral brachial, anterior tibial, and posterior tibial arteries, when vessel segments are accessible. Bilateral testing is considered an integral part of a complete examination. Photoelectric Plethysmograph (PPG) waveforms and toe systolic pressure readings are included as required and additional duplex testing as needed. Limited examinations for reoccurring indications may be performed as noted.  ABI Findings: +---------+------------------+-----+---------+--------+ Right    Rt Pressure (mmHg)IndexWaveform Comment  +---------+------------------+-----+---------+--------+ Brachial 136                    triphasic         +---------+------------------+-----+---------+--------+ ATA      165               1.21 triphasic         +---------+------------------+-----+---------+--------+ PTA      172               1.26 triphasic         +---------+------------------+-----+---------+--------+  Great Toe147               1.08 Normal            +---------+------------------+-----+---------+--------+ +---------+------------------+-----+---------+-------+ Left     Lt Pressure (mmHg)IndexWaveform Comment +---------+------------------+-----+---------+-------+ Brachial 132                    triphasic        +---------+------------------+-----+---------+-------+ ATA      172               1.26 triphasic        +---------+------------------+-----+---------+-------+ PTA      173               1.27 triphasic        +---------+------------------+-----+---------+-------+ Great Toe144               1.06 Normal           +---------+------------------+-----+---------+-------+ +-------+-----------+-----------+------------+------------+ ABI/TBIToday's ABIToday's TBIPrevious  ABIPrevious TBI +-------+-----------+-----------+------------+------------+ Right  1.26       1.08       1.25        0.92         +-------+-----------+-----------+------------+------------+ Left   1.27       106        1.18        0.91         +-------+-----------+-----------+------------+------------+ Bilateral ABIs and TBIs appear essentially unchanged.  Summary: Right: Resting right ankle-brachial index is within normal range. The right toe-brachial index is normal.  Left: Resting left ankle-brachial index is within normal range. The left toe-brachial index is normal.  *See table(s) above for measurements and observations.  Electronically signed by Maude Emmer MD on 09/02/2024 at 3:47:08 PM.    Final     Impression:  Enlarging hypermetabolic RUL pulmonary nodule suspicious for malignancy (negative nodal biopsies)    The patient is recovering from the effects of radiation.  ***  Plan:  ***   *** minutes of total time was spent for this patient encounter, including preparation, face-to-face counseling with the patient and coordination of care, physical exam, and documentation of the encounter. ____________________________________  Lynwood CHARM Nasuti, PhD, MD  This document serves as a record of services personally performed by Lynwood Nasuti, MD. It was created on his behalf by Reymundo Cartwright, a trained medical scribe. The creation of this record is based on the scribe's personal observations and the provider's statements to them. This document has been checked and approved by the attending provider.

## 2024-09-15 ENCOUNTER — Encounter: Payer: Self-pay | Admitting: Radiation Oncology

## 2024-09-15 ENCOUNTER — Ambulatory Visit
Admission: RE | Admit: 2024-09-15 | Discharge: 2024-09-15 | Disposition: A | Discharge status: Home / Self Care | Payer: Self-pay | Source: Ambulatory Visit | Attending: Radiation Oncology | Admitting: Radiation Oncology

## 2024-09-15 VITALS — BP 146/92 | HR 85 | Temp 97.8°F | Resp 18 | Ht 68.0 in | Wt 169.2 lb

## 2024-09-15 DIAGNOSIS — K861 Other chronic pancreatitis: Secondary | ICD-10-CM | POA: Diagnosis not present

## 2024-09-15 DIAGNOSIS — R918 Other nonspecific abnormal finding of lung field: Secondary | ICD-10-CM | POA: Insufficient documentation

## 2024-09-15 DIAGNOSIS — I700 Atherosclerosis of aorta: Secondary | ICD-10-CM | POA: Diagnosis not present

## 2024-09-15 DIAGNOSIS — Z7951 Long term (current) use of inhaled steroids: Secondary | ICD-10-CM | POA: Insufficient documentation

## 2024-09-15 DIAGNOSIS — J439 Emphysema, unspecified: Secondary | ICD-10-CM | POA: Diagnosis not present

## 2024-09-15 DIAGNOSIS — K7689 Other specified diseases of liver: Secondary | ICD-10-CM | POA: Insufficient documentation

## 2024-09-15 DIAGNOSIS — C3411 Malignant neoplasm of upper lobe, right bronchus or lung: Secondary | ICD-10-CM

## 2024-09-15 DIAGNOSIS — Z79899 Other long term (current) drug therapy: Secondary | ICD-10-CM | POA: Insufficient documentation

## 2024-09-15 DIAGNOSIS — R599 Enlarged lymph nodes, unspecified: Secondary | ICD-10-CM | POA: Insufficient documentation

## 2024-09-15 NOTE — Progress Notes (Signed)
 Wesley [name] is here today to receive CT scan results   Does the patient complain of any of the following: Pain:No Shortness of breath w/wo exertion: yes, mostly on exertion.  Cough: No Hemoptysis: No Pain with swallowing: No Appetite: good Energy Level: Fair    Additional comments if applicable:   BP (!) 146/92 (BP Location: Left Arm, Patient Position: Sitting, Cuff Size: Large)   Pulse 85   Temp 97.8 F (36.6 C)   Resp 18   Ht 5' 8 (1.727 m)   Wt 169 lb 3.2 oz (76.7 kg)   SpO2 100%   BMI 25.73 kg/m

## 2024-09-23 DIAGNOSIS — Z79891 Long term (current) use of opiate analgesic: Secondary | ICD-10-CM | POA: Diagnosis not present

## 2024-09-23 DIAGNOSIS — M545 Low back pain, unspecified: Secondary | ICD-10-CM | POA: Diagnosis not present

## 2024-09-23 DIAGNOSIS — Z79899 Other long term (current) drug therapy: Secondary | ICD-10-CM | POA: Diagnosis not present

## 2024-09-29 ENCOUNTER — Other Ambulatory Visit (HOSPITAL_COMMUNITY): Payer: Self-pay

## 2024-10-03 ENCOUNTER — Other Ambulatory Visit

## 2024-10-03 ENCOUNTER — Ambulatory Visit: Admitting: Orthopedic Surgery

## 2024-10-03 ENCOUNTER — Encounter: Payer: Self-pay | Admitting: Radiology

## 2024-10-03 VITALS — BP 127/80 | HR 92 | Ht 68.0 in | Wt 170.0 lb

## 2024-10-03 DIAGNOSIS — Z720 Tobacco use: Secondary | ICD-10-CM

## 2024-10-03 DIAGNOSIS — M5416 Radiculopathy, lumbar region: Secondary | ICD-10-CM | POA: Diagnosis not present

## 2024-10-03 DIAGNOSIS — M545 Low back pain, unspecified: Secondary | ICD-10-CM

## 2024-10-03 NOTE — Progress Notes (Unsigned)
 Orthopedic Spine Surgery Office Note  Assessment: Patient is a 58 y.o. male with low back pain and left leg numbness and paresthesias along the lateral aspect of the thigh leg   Plan: -Explained that initially conservative treatment is tried as a significant number of patients may experience relief with these treatment modalities. Discussed that the conservative treatments include:  -activity modification  -physical therapy  -over the counter pain medications  -medrol  dosepak  -lumbar steroid injections -Patient has tried Tylenol , Lyrica  -Recommended diagnostic and therapeutic L5 transforaminal injection -Would need to be nicotine  free prior to any elective spine surgery -Patient should return to office on an as needed basis   I spent talking to the patient about the health risks associated with smoking and encouraged cessation. Explained that from a surgical perspective, smoking increases the risk of infection, wound healing complications, and nonunion. Patient expressed understanding and will work on quitting if surgery ends up being something he would like to pursue. His wife has had to go that before for a knee replacement so he understands the rationale and thinks he could do it if needed.    ___________________________________________________________________________   History:  Patient is a 58 y.o. male who presents today for lumbar spine.  Patient has had over a year of low back pain and left lower extremity numbness and paresthesias.  There was no trauma or injury that preceded the onset of the pain.  His symptoms have gotten progressively worse with time.  He does not have any pain going into the right lower extremity.  He has numbness and paresthesias in bilateral feet from neuropathy.  He does not have any other numbness or paresthesias in the right lower extremity.  He has not tried any treatment recently for his pain.   Weakness: Denies Symptoms of imbalance:  Denies Paresthesias and numbness: Yes, has numbness and paresthesias in bilateral feet from neuropathy. No recent changes in numbness or paresthesias.  Bowel or bladder incontinence: Denies Saddle anesthesia: Denies  Treatments tried: Tylenol , Lyrica   Review of systems: Denies fevers and chills, night sweats, unexplained weight loss. Has had pain that wakes him at night. History of lung cancer  Past medical history: History of alcohol abuse GERD History of lung cancer Neuropathy  Allergies: NKDA  Past surgical history:  Cholecystectomy Colostomy Colostomy take down Right cubital tunnel release  Social history: Reports use of nicotine  product (smoking, vaping, patches, smokeless) Alcohol use: denies Denies recreational drug use   Physical Exam:  BMI of 25.9  General: no acute distress, appears stated age Neurologic: alert, answering questions appropriately, following commands Respiratory: unlabored breathing on room air, symmetric chest rise Psychiatric: appropriate affect, normal cadence to speech   MSK (spine):  -Strength exam      Left  Right EHL    5/5  5/5 TA    5/5  5/5 GSC    5/5  5/5 Knee extension  5/5  5/5 Hip flexion   5/5  5/5  -Sensory exam    Sensation intact to light touch in L3-S1 nerve distributions of bilateral lower extremities  -Achilles DTR: 2/4 on the left, 2/4 on the right -Patellar tendon DTR: 2/4 on the left, 2/4 on the right  -Straight leg raise: Negative bilaterally -Clonus: no beats bilaterally  -Left hip exam: no pain through range of motion, negative stinchfield -Right hip exam: no pain through range of motion, negative stinchfield  Imaging: XRs of the lumbar spine from 10/03/2024 were independently reviewed and interpreted, showing disc  height loss at L4/5 and L5/S1. Vacuum disc phenomenon at L5/S1. No fracture or dislocation seen. No evidence of instability on flexion/extension views.    MRI of the lumbar spine from  10/01/2022 was independently reviewed and interpreted, showing DDD at L5/S1. Bilateral foraminal stenosis at L5/S1. No other significant stenosis seen.    Patient name: Wesley [name] Patient MRN: [id number] Date of visit: 10/03/24

## 2024-10-26 ENCOUNTER — Ambulatory Visit: Admitting: Physical Medicine and Rehabilitation

## 2024-10-26 ENCOUNTER — Other Ambulatory Visit: Payer: Self-pay

## 2024-10-26 VITALS — BP 128/80 | HR 87

## 2024-10-26 DIAGNOSIS — M5416 Radiculopathy, lumbar region: Secondary | ICD-10-CM | POA: Diagnosis not present

## 2024-10-26 MED ORDER — METHYLPREDNISOLONE ACETATE 40 MG/ML IJ SUSP
40.0000 mg | Freq: Once | INTRAMUSCULAR | Status: AC
  Administered 2024-10-26: 40 mg

## 2024-10-26 NOTE — Progress Notes (Signed)
 Pain Scale   Average Pain 7 Patient advising he has lower back pain radiating bilaterally to legs and pain is constant.        +Driver, -BT, -Dye Allergies.

## 2024-10-26 NOTE — Progress Notes (Signed)
 Wesley [name] - 58 y.o. male MRN [id number]  Date of birth: 1966-02-10  Office Visit Note: Visit Date: 10/26/2024 PCP: Jolaine Pac, DO Referred by: Georgina Ozell LABOR, MD  Subjective: Chief Complaint  Patient presents with   Lower Back - Pain   HPI:  Wesley [name] is a 58 y.o. male who comes in today at the request of Dr. Ozell Georgina for planned Right L5-S1 Lumbar Transforaminal epidural steroid injection with fluoroscopic guidance.  The patient has failed conservative care including home exercise, medications, time and activity modification.  This injection will be diagnostic and hopefully therapeutic.  Please see requesting physician notes for further details and justification.   ROS Otherwise per HPI.  Assessment & Plan: Visit Diagnoses:    ICD-10-CM   1. Lumbar radiculopathy  M54.16 XR C-ARM NO REPORT    Epidural Steroid injection    methylPREDNISolone  acetate (DEPO-MEDROL ) injection 40 mg      Plan: No additional findings.   Meds & Orders:  Meds ordered this encounter  Medications   methylPREDNISolone  acetate (DEPO-MEDROL ) injection 40 mg    Orders Placed This Encounter  Procedures   XR C-ARM NO REPORT   Epidural Steroid injection    Follow-up: No follow-ups on file.   Procedures: No procedures performed      Clinical History: CLINICAL DATA:  Lumbar radiculopathy with symptoms persisting over 6 weeks of treatment   EXAM: MRI LUMBAR SPINE WITHOUT CONTRAST   TECHNIQUE: Multiplanar, multisequence MR imaging of the lumbar spine was performed. No intravenous contrast was administered.   COMPARISON:  12/29/2020   FINDINGS: Segmentation:  5 lumbar type vertebrae   Alignment:  Straightening of lumbar lordosis.   Vertebrae: Essentially resolved edema at L5-S1, now with a primarily sclerotic appearance. Involvement of both sides of the narrow disc space again favors a degenerative process. There is a contemporaneous PET CT for lung nodule which shows no  worrisome activity at this level.   Conus medullaris and cauda equina: Conus extends to the L1-2 level. Conus and cauda equina appear normal.   Paraspinal and other soft tissues: Negative for perispinal mass or inflammation.   Disc levels:   T12- L1: Unremarkable.   L1-L2: Unremarkable.   L2-L3: Disc bulging with biforaminal protrusion. Right foraminal annular fissure. Bilateral foraminal narrowing is mild.   L3-L4: Disc narrowing and bulging. Mild bilateral foraminal narrowing.   L4-L5: Disc narrowing and bulging with endplate spurring. Mild facet spurring. Mild bilateral foraminal narrowing.   L5-S1:Disc collapse with endplate ridging and facet spurring. Biforaminal impingement.   IMPRESSION: 1. Resolved marrow edema at L5-S1. 2. Otherwise stable since January 2022. L5-S1 foraminal impingement greater on the right.     Electronically Signed   By: Dorn Roulette M.D.   On: 10/03/2022 06:44     Objective:  VS:  HT:    WT:   BMI:     BP:128/80  HR:87bpm  TEMP: ( )  RESP:  Physical Exam Vitals and nursing note reviewed.  Constitutional:      General: He is not in acute distress.    Appearance: Normal appearance. He is not ill-appearing.  HENT:     Head: Normocephalic and atraumatic.     Right Ear: External ear normal.     Left Ear: External ear normal.     Nose: No congestion.  Eyes:     Extraocular Movements: Extraocular movements intact.  Cardiovascular:     Rate and Rhythm: Normal rate.     Pulses:  Normal pulses.  Pulmonary:     Effort: Pulmonary effort is normal. No respiratory distress.  Abdominal:     General: There is no distension.     Palpations: Abdomen is soft.  Musculoskeletal:        General: No tenderness or signs of injury.     Cervical back: Neck supple.     Right lower leg: No edema.     Left lower leg: No edema.     Comments: Patient has good distal strength without clonus.  Skin:    Findings: No erythema or rash.   Neurological:     General: No focal deficit present.     Mental Status: He is alert and oriented to person, place, and time.     Sensory: No sensory deficit.     Motor: No weakness or abnormal muscle tone.     Coordination: Coordination normal.  Psychiatric:        Mood and Affect: Mood normal.        Behavior: Behavior normal.      Imaging: XR C-ARM NO REPORT Result Date: 10/26/2024 Please see Notes tab for imaging impression.

## 2024-10-31 ENCOUNTER — Other Ambulatory Visit: Payer: Self-pay | Admitting: Student

## 2024-10-31 DIAGNOSIS — M48062 Spinal stenosis, lumbar region with neurogenic claudication: Secondary | ICD-10-CM

## 2024-10-31 DIAGNOSIS — M792 Neuralgia and neuritis, unspecified: Secondary | ICD-10-CM

## 2024-11-02 ENCOUNTER — Other Ambulatory Visit: Payer: Self-pay

## 2024-11-02 ENCOUNTER — Other Ambulatory Visit (HOSPITAL_COMMUNITY): Payer: Self-pay

## 2024-11-02 DIAGNOSIS — K219 Gastro-esophageal reflux disease without esophagitis: Secondary | ICD-10-CM

## 2024-11-02 MED ORDER — FAMOTIDINE 20 MG PO TABS: 20.0000 mg | ORAL_TABLET | Freq: Two times a day (BID) | ORAL | 2 refills | Status: AC

## 2024-11-02 MED ORDER — PREGABALIN 150 MG PO CAPS
150.0000 mg | ORAL_CAPSULE | Freq: Three times a day (TID) | ORAL | 1 refills | Status: AC
  Filled 2024-11-02: qty 90, 30d supply, fill #0
  Filled 2024-12-15: qty 90, 30d supply, fill #1

## 2024-11-02 NOTE — Telephone Encounter (Signed)
 Medication sent to pharmacy

## 2024-11-05 ENCOUNTER — Other Ambulatory Visit: Payer: Self-pay | Admitting: Student

## 2024-11-25 ENCOUNTER — Ambulatory Visit: Payer: Self-pay

## 2024-11-25 NOTE — Telephone Encounter (Signed)
 FYI Only or Action Required?: FYI only for provider: appointment scheduled on 11/29/24.  Patient was last seen in primary care on 08/25/2024 by Tobie Gaines, DO.  Called Nurse Triage reporting Rash.  Symptoms began 3 weeks ago.  Interventions attempted: Nothing.  Symptoms are: unchanged.  Triage Disposition: See PCP When Office is Open (Within 3 Days)  Patient/caregiver understands and will follow disposition?: Yes, but will wait  Reason for Disposition  Mild widespread rash  (Exception: Heat rash lasting 3 days or less.)  Answer Assessment - Initial Assessment Questions Patient called in to schedule appointment to evaluate painful rash that has been present for 3 weeks, he notes that he did receive a steroid shot about 4 weeks ago but does not think this is related.  He describes rash as small flesh colored bumps on his left side just below ribs that is tender to touch, painful when laying on left side. He does have shortness of breath at baseline. Denies swallowing difficulty, chest pain, fever, and all other symptoms.  Next available appointment with pcp is scheduled on 11/29/24, patient aware of ED precautions.    1. APPEARANCE of RASH: What does the rash look like? (e.g., blisters, dry flaky skin, red spots, redness, sores)     Small bumps that are flesh colored  2. SIZE: How big are the spots? (e.g., tip of pen, eraser, coin; inches, centimeters)     Small  3. LOCATION: Where is the rash located?     Left side near rib cage  4. COLOR: What color is the rash? (Note: It is difficult to assess rash color in people with darker-colored skin. When this situation occurs, simply ask the caller to describe what they see.)     Flesh  5. ONSET: When did the rash begin?     3 weeks ago  6. FEVER: Do you have a fever? If Yes, ask: What is your temperature, how was it measured, and when did it start?     denies 7. ITCHING: Does the rash itch? If Yes, ask: How bad is the  itch? (Scale 1-10; or mild, moderate, severe)     No but irritating  8. CAUSE: What do you think is causing the rash?     Unsure  9. MEDICINE FACTORS: Have you started any new medicines within the last 2 weeks? (e.g., antibiotics)      Steroid shot about one month ago  10. OTHER SYMPTOMS: Do you have any other symptoms? (e.g., dizziness, headache, sore throat, joint pain)       Tender to touch, cannot lay on left side due to pain. Shortness of breath at baseline.  Protocols used: Rash or Redness - Widespread-A-AH   Message from [place] [facility] sent at 11/25/2024  2:35 PM EST  Summary: Please call [phone] Mobile pt states you can call him on his mobile   Reason for Triage: pt stated left sided pain rash occurring with the pain, scratches from rear end to the legs then disappears after a few days needing to speak with someone before scheduling an appt> Please call [phone] Mobile

## 2024-11-29 ENCOUNTER — Ambulatory Visit (INDEPENDENT_AMBULATORY_CARE_PROVIDER_SITE_OTHER): Payer: Self-pay | Admitting: Student

## 2024-11-29 ENCOUNTER — Other Ambulatory Visit (HOSPITAL_COMMUNITY): Payer: Self-pay

## 2024-11-29 VITALS — BP 136/90 | HR 88 | Temp 98.6°F | Ht 68.0 in | Wt 174.4 lb

## 2024-11-29 DIAGNOSIS — L299 Pruritus, unspecified: Secondary | ICD-10-CM | POA: Diagnosis not present

## 2024-11-29 DIAGNOSIS — L989 Disorder of the skin and subcutaneous tissue, unspecified: Secondary | ICD-10-CM | POA: Diagnosis not present

## 2024-11-29 MED ORDER — HYDROCORTISONE 1 % EX CREA
TOPICAL_CREAM | CUTANEOUS | 11 refills | Status: AC
  Filled 2024-11-29: qty 28, 30d supply, fill #0

## 2024-11-29 NOTE — Patient Instructions (Signed)
 Thank you, [name], for allowing us  to provide your care today. Today we discussed . . .  > Rash       - I have sent a referral to dermatology and they should call you to schedule your visit.  They will talk to you about biopsying one of the skin lesions.  In the meantime please apply moisturizer cream as needed and use the hydrocortisone  cream twice a [place] for the itching.  You can use Tylenol  1000 mg every 8 hours for the pain associated with the spot.  If there are any new or worsening symptoms that occur please call our office.  We will see you back in 1 month to reevaluate.   I have ordered the following labs for you:   Lab Orders         CBC with Differential/Platelet         Comprehensive metabolic panel with GFR       Referrals ordered today:    Referral Orders         Ambulatory referral to Dermatology       Follow up: 1 month    Remember:  Should you have any questions or concerns please call the internal medicine clinic at [phone].     Fairy Pool, DO [facility]

## 2024-11-29 NOTE — Progress Notes (Signed)
 "  CC: Acute Concern of painful and itchy rash  HPI:  Wesley [name] is a 58 y.o. male with pertinent PMH of COPD, lung cancer, chronic pancreatitis 2/2 alcohol use, and PAD who presents as above. Please see assessment and plan below for further details.  Medications: Current Outpatient Medications  Medication Instructions   acetaminophen  (TYLENOL ) 1,500 mg, Every 6 hours PRN   albuterol  (PROVENTIL ) 2.5 mg, Nebulization, Every 6 hours PRN   albuterol  (VENTOLIN  HFA) 108 (90 Base) MCG/ACT inhaler 2 puffs, Inhalation, Every 6 hours PRN   amitriptyline  ([name] ) 50 mg, Oral, Daily at bedtime   atorvastatin  (LIPITOR) 10 mg, Oral, Daily   bisacodyl  (DULCOLAX) 5 mg, Daily PRN   BREO ELLIPTA  200-25 MCG/ACT AEPB USE 1 INHALATION BY MOUTH DAILY   cyanocobalamin  (VITAMIN B12) 1,000 mcg, Oral, Daily   DULoxetine  (CYMBALTA ) 60 MG capsule TAKE 1 CAPSULE BY MOUTH DAILY   famotidine  (PEPCID ) 20 mg, Oral, 2 times daily   hydrocortisone  cream 1 % Apply to the affected area 2 times daily   Linzess  145 mcg, Oral, Daily before breakfast   lipase/protease/amylase ([name] ) 36000 UNITS CPEP capsule Take 2 capsules (72,000 Units total) by mouth 3 (three) times daily with meals AND 1 capsule (36,000 Units total) with snacks. Max: 9 capsules per [place].   Multiple Vitamin (MULTIVITAMIN WITH MINERALS) TABS tablet 1 tablet, Every morning   nicotine  (NICODERM CQ  - DOSED IN MG/24 HOURS) 14 mg, Transdermal, Daily   oxycodone  (ROXICODONE ) 15 mg, 3 times daily PRN   pantoprazole  (PROTONIX ) 40 mg, Oral, 2 times daily before meals   polyethylene glycol (MIRALAX  / GLYCOLAX ) 17 g, Daily PRN   pregabalin  (LYRICA ) 150 mg, Oral, 3 times daily   senna (SENOKOT) 8.6 mg, Oral, Daily   tamsulosin  (FLOMAX ) 0.4 mg, Oral, Daily   Vitamin D  (Ergocalciferol ) (DRISDOL ) 50,000 Units, Oral, Every 7 days     Review of Systems:   Pertinent items noted in HPI and/or A&P.  Physical Exam:  Vitals:   11/29/24 0806 11/29/24 0813  BP: (!)  143/83 (!) 136/90  Pulse: 96 88  Temp: 98.6 F (37 C)   TempSrc: Oral   SpO2: 94%   Weight: 174 lb 6.4 oz (79.1 kg)   Height: 5' 8 (1.727 m)     Constitutional: Well-appearing elderly male. In no acute distress. HEENT: Normocephalic, atraumatic, Sclera non-icteric, PERRL, EOM intact Pulm: Normal work of breathing on room air. Abdomen: Soft, non-distended, positive bowel sounds.  Mild point tenderness in the left upper quadrant anterior axillary line at the inferior rib margin without underlying nodule or overlying erythema, there is a small flesh-colored to hypopigmented lesion without drainage overlying. FDX:Wzhjupcz for extremity edema. Skin: Multiple flesh-colored to hypopigmented papules measuring less than 1 cm primarily in the left anterior abdomen without associated erythema, drainage, vesicles, central clearing.  No other lesions noted on skin exam throughout the rest of the body other than stable hyperpigmented scars from laparoscopic surgery. Neuro:Alert and oriented x3. No focal deficit noted. Psych:Pleasant mood and affect.   Assessment & Plan:   Assessment & Plan Pruritus Skin lesion Presents with 1 month of flesh-colored to hypopigmented papules that have been itchy and located in the left abdomen, flank, and lower back.  He has had some associated tenderness although it is located primarily under 1 small lesion in his left upper quadrant in the anterior axillary line at the inferior rib margin.  Not consistent with varicella-zoster, contact dermatitis (no change in exposures), pityriasis, or drug  eruption.  Also not consistent with previous pancreatitis pain or lumbar stenosis pain.  No other associated systemic symptoms noted.  There are some similarities to hypopigmented sarcoidosis manifestation and on prior chest imaging he has had some mild enlargement of his hilar and mediastinal lymph nodes although it has been stable with his last CT chest with contrast on 09/09/2024.   Discussed further evaluation for sarcoid with a skin biopsy with dermatology and he is amenable to this.  We will also check for any abnormal cell lines as well as metabolic panel including liver enzymes.  In the meantime we will treat his symptoms with moisturizer cream and hydrocortisone  cream. - Referral to dermatology - Daily moisturizer cream and hydrocortisone  cream twice daily until symptoms improve - CMP and CBC drawn today  Orders Placed This Encounter  Procedures   CBC with Differential/Platelet   Comprehensive metabolic panel with GFR   Ambulatory referral to Dermatology    Referral Priority:   Urgent    Referral Type:   Consultation    Referral Reason:   Specialty Services Required    Requested Specialty:   Dermatology    Number of Visits Requested:   1     No follow-ups on file.   Patient discussed with Dr. Mliss Foot  Fairy Pool, DO [facility] Internal Medicine Resident PGY-3 Clinic Phone: [phone] Please contact the on call pager at [phone] for any urgent or emergent needs. "

## 2024-11-30 LAB — CBC WITH DIFFERENTIAL/PLATELET
Basophils Absolute: 0.1 x10E3/uL (ref 0.0–0.2)
Basos: 1 %
EOS (ABSOLUTE): 0.4 x10E3/uL (ref 0.0–0.4)
Eos: 3 %
Hematocrit: 45.7 % (ref 37.5–51.0)
Hemoglobin: 15.5 g/dL (ref 13.0–17.7)
Immature Grans (Abs): 0 x10E3/uL (ref 0.0–0.1)
Immature Granulocytes: 0 %
Lymphocytes Absolute: 3.5 x10E3/uL — ABNORMAL HIGH (ref 0.7–3.1)
Lymphs: 26 %
MCH: 29.8 pg (ref 26.6–33.0)
MCHC: 33.9 g/dL (ref 31.5–35.7)
MCV: 88 fL (ref 79–97)
Monocytes Absolute: 1.2 x10E3/uL — ABNORMAL HIGH (ref 0.1–0.9)
Monocytes: 9 %
Neutrophils Absolute: 8.1 x10E3/uL — ABNORMAL HIGH (ref 1.4–7.0)
Neutrophils: 61 %
Platelets: 394 x10E3/uL (ref 150–450)
RBC: 5.2 x10E6/uL (ref 4.14–5.80)
RDW: 16.1 % — ABNORMAL HIGH (ref 11.6–15.4)
WBC: 13.3 x10E3/uL — ABNORMAL HIGH (ref 3.4–10.8)

## 2024-11-30 LAB — COMPREHENSIVE METABOLIC PANEL WITH GFR
ALT: 10 IU/L (ref 0–44)
AST: 12 IU/L (ref 0–40)
Albumin: 4 g/dL (ref 3.8–4.9)
Alkaline Phosphatase: 95 IU/L (ref 47–123)
BUN/Creatinine Ratio: 7 — ABNORMAL LOW (ref 9–20)
BUN: 8 mg/dL (ref 6–24)
Bilirubin Total: 0.5 mg/dL (ref 0.0–1.2)
CO2: 24 mmol/L (ref 20–29)
Calcium: 9.9 mg/dL (ref 8.7–10.2)
Chloride: 100 mmol/L (ref 96–106)
Creatinine, Ser: 1.18 mg/dL (ref 0.76–1.27)
Globulin, Total: 3.2 g/dL (ref 1.5–4.5)
Glucose: 96 mg/dL (ref 70–99)
Potassium: 4.2 mmol/L (ref 3.5–5.2)
Sodium: 140 mmol/L (ref 134–144)
Total Protein: 7.2 g/dL (ref 6.0–8.5)
eGFR: 71 mL/min/1.73

## 2024-11-30 NOTE — Progress Notes (Signed)
 Internal Medicine Clinic Attending  Case discussed with the resident at the time of the visit.  We reviewed the residents history and exam and pertinent patient test results.  I agree with the assessment, diagnosis, and plan of care documented in the residents note.    I am not 100% sure what is causing this rash. I am reassured that he's not having systemic signs of illness. I agree with plan to refer to Dermatology for biopsy and further assessment. Picture is in the chart.

## 2024-12-08 ENCOUNTER — Ambulatory Visit: Payer: Self-pay | Admitting: Student

## 2024-12-08 NOTE — Progress Notes (Signed)
 CBC shows leukocytosis with elevated lymphocytes, neutrophils, and monocytes.  Most likely related to either infection or inflammatory condition.  He is being worked up for his pruritic skin lesions and at follow-up would recommend repeat CBC with differential.  If symptoms have not improved would also add on a blood smear to look for abnormal white blood cells.  If persistent lymphocytosis I would make sure that his oncologist is aware as well for any additional input.  No additional lab work in the interim, continue with current plan of dermatology referral.

## 2024-12-14 ENCOUNTER — Encounter: Payer: Self-pay | Admitting: Physician Assistant

## 2024-12-14 ENCOUNTER — Ambulatory Visit: Admitting: Physician Assistant

## 2024-12-14 VITALS — BP 121/79

## 2024-12-14 DIAGNOSIS — R21 Rash and other nonspecific skin eruption: Secondary | ICD-10-CM

## 2024-12-14 DIAGNOSIS — L281 Prurigo nodularis: Secondary | ICD-10-CM | POA: Diagnosis not present

## 2024-12-14 DIAGNOSIS — L280 Lichen simplex chronicus: Secondary | ICD-10-CM | POA: Diagnosis not present

## 2024-12-14 NOTE — Patient Instructions (Signed)

## 2024-12-14 NOTE — Progress Notes (Signed)
" ° °  New Patient Visit   Subjective  Wesley [name] is a 59 y.o. male NEW PATIENT who presents for the following: He had some bumps of his left side that were not itchy. He says it seems like they might be gone now.   He also picks at his skin and has for many years.     The following portions of the chart were reviewed this encounter and updated as appropriate: medications, allergies, medical history  Review of Systems:  No other skin or systemic complaints except as noted in HPI or Assessment and Plan.  Objective  Well appearing patient in no apparent distress; mood and affect are within normal limits.   A focused examination was performed of the following areas: Trunk   Relevant exam findings are noted in the Assessment and Plan.    Assessment & Plan   RASH -- BACK  Exam: Resolved  Treatment Plan: No treatment. Call if recurs     PRURIGO NODULARIS/LICHEN SIMPLEX CHRONICUS Exam: Excoriated papules and nodules at trunk   Lichen simplex chronicus [facility]) is a persistent itchy area of thickened skin that is induced by chronic rubbing and/or scratching (chronic dermatitis).  These areas may be pink, hyperpigmented and may have excoriations and bumps (prurigo nodules-PN).  PN/LSC is commonly observed in uncontrolled atopic dermatitis and other forms of eczema, and in other itchy skin conditions (eg, insect bites, scabies).  Sometimes it is not possible to know initial cause of PN/LSC if it has been present for a long time.  It generally responds well to treatment with high potency topical steroids.  It is important to stop rubbing/scratching the area in order to break the itch-scratch-rash-itch cycle, in order for the rash to resolve.   Treatment Plan: Patient declines treatment today.  Avoid picking/rubbing/scratching  PRURIGO NODULARIS   RASH AND OTHER NONSPECIFIC SKIN ERUPTION    Return if symptoms worsen or fail to improve.  I, Roseline Hutchinson, CMA, am acting as scribe  for Thiago Ragsdale K, PA-C .   Documentation: I have reviewed the above documentation for accuracy and completeness, and I agree with the above.  Zedrick Springsteen K, PA-C    "

## 2024-12-16 ENCOUNTER — Other Ambulatory Visit (HOSPITAL_COMMUNITY): Payer: Self-pay

## 2024-12-21 ENCOUNTER — Other Ambulatory Visit: Payer: Self-pay

## 2024-12-21 DIAGNOSIS — K860 Alcohol-induced chronic pancreatitis: Secondary | ICD-10-CM

## 2024-12-21 MED ORDER — AMITRIPTYLINE HCL 50 MG PO TABS: 50.0000 mg | ORAL_TABLET | Freq: Every day | ORAL | 3 refills | Status: AC

## 2025-01-11 ENCOUNTER — Ambulatory Visit: Admitting: Physician Assistant

## 2025-03-23 ENCOUNTER — Ambulatory Visit: Admitting: Radiation Oncology
